# Patient Record
Sex: Female | Born: 1965 | Race: Black or African American | Hispanic: No | Smoking: Former smoker
Health system: Southern US, Community
[De-identification: ages and names within clinical notes are randomized; demographics above are authoritative.]

## PROBLEM LIST (undated history)

## (undated) DIAGNOSIS — G4733 Obstructive sleep apnea (adult) (pediatric): Secondary | ICD-10-CM

## (undated) DIAGNOSIS — I509 Heart failure, unspecified: Secondary | ICD-10-CM

## (undated) DIAGNOSIS — Z9981 Dependence on supplemental oxygen: Secondary | ICD-10-CM

## (undated) DIAGNOSIS — I4891 Unspecified atrial fibrillation: Secondary | ICD-10-CM

## (undated) DIAGNOSIS — I639 Cerebral infarction, unspecified: Secondary | ICD-10-CM

## (undated) DIAGNOSIS — I7121 Aneurysm of the ascending aorta, without rupture: Secondary | ICD-10-CM

## (undated) DIAGNOSIS — E079 Disorder of thyroid, unspecified: Secondary | ICD-10-CM

## (undated) DIAGNOSIS — M109 Gout, unspecified: Secondary | ICD-10-CM

## (undated) DIAGNOSIS — G4730 Sleep apnea, unspecified: Secondary | ICD-10-CM

## (undated) DIAGNOSIS — K219 Gastro-esophageal reflux disease without esophagitis: Secondary | ICD-10-CM

## (undated) DIAGNOSIS — T7840XA Allergy, unspecified, initial encounter: Secondary | ICD-10-CM

## (undated) DIAGNOSIS — D3502 Benign neoplasm of left adrenal gland: Secondary | ICD-10-CM

## (undated) DIAGNOSIS — E669 Obesity, unspecified: Secondary | ICD-10-CM

## (undated) DIAGNOSIS — I2699 Other pulmonary embolism without acute cor pulmonale: Secondary | ICD-10-CM

## (undated) DIAGNOSIS — J449 Chronic obstructive pulmonary disease, unspecified: Secondary | ICD-10-CM

## (undated) DIAGNOSIS — G3184 Mild cognitive impairment, so stated: Secondary | ICD-10-CM

## (undated) DIAGNOSIS — K5790 Diverticulosis of intestine, part unspecified, without perforation or abscess without bleeding: Secondary | ICD-10-CM

## (undated) DIAGNOSIS — F419 Anxiety disorder, unspecified: Secondary | ICD-10-CM

## (undated) DIAGNOSIS — I209 Angina pectoris, unspecified: Secondary | ICD-10-CM

## (undated) DIAGNOSIS — F1010 Alcohol abuse, uncomplicated: Secondary | ICD-10-CM

## (undated) DIAGNOSIS — I10 Essential (primary) hypertension: Secondary | ICD-10-CM

## (undated) DIAGNOSIS — F1411 Cocaine abuse, in remission: Secondary | ICD-10-CM

## (undated) DIAGNOSIS — I071 Rheumatic tricuspid insufficiency: Secondary | ICD-10-CM

## (undated) DIAGNOSIS — R011 Cardiac murmur, unspecified: Secondary | ICD-10-CM

## (undated) DIAGNOSIS — Z87442 Personal history of urinary calculi: Secondary | ICD-10-CM

## (undated) DIAGNOSIS — I712 Thoracic aortic aneurysm, without rupture: Secondary | ICD-10-CM

## (undated) DIAGNOSIS — T148XXA Other injury of unspecified body region, initial encounter: Secondary | ICD-10-CM

## (undated) DIAGNOSIS — K760 Fatty (change of) liver, not elsewhere classified: Secondary | ICD-10-CM

## (undated) HISTORY — DX: Allergy, unspecified, initial encounter: T78.40XA

## (undated) HISTORY — DX: Other injury of unspecified body region, initial encounter: T14.8XXA

## (undated) HISTORY — DX: Fatty (change of) liver, not elsewhere classified: K76.0

## (undated) HISTORY — DX: Sleep apnea, unspecified: G47.30

## (undated) HISTORY — DX: Benign neoplasm of left adrenal gland: D35.02

## (undated) HISTORY — PX: CARDIAC CATHETERIZATION: SHX172

## (undated) HISTORY — DX: Disorder of thyroid, unspecified: E07.9

## (undated) HISTORY — DX: Gastro-esophageal reflux disease without esophagitis: K21.9

## (undated) HISTORY — DX: Mild cognitive impairment of uncertain or unknown etiology: G31.84

## (undated) HISTORY — DX: Obesity, unspecified: E66.9

## (undated) HISTORY — DX: Other pulmonary embolism without acute cor pulmonale: I26.99

## (undated) HISTORY — DX: Alcohol abuse, uncomplicated: F10.10

---

## 2000-01-26 HISTORY — PX: OTHER SURGICAL HISTORY: SHX169

## 2001-09-20 HISTORY — PX: ORIF ANKLE FRACTURE BIMALLEOLAR: SUR920

## 2004-10-06 DIAGNOSIS — D3502 Benign neoplasm of left adrenal gland: Secondary | ICD-10-CM

## 2004-10-06 HISTORY — DX: Benign neoplasm of left adrenal gland: D35.02

## 2008-01-26 DIAGNOSIS — I4891 Unspecified atrial fibrillation: Secondary | ICD-10-CM | POA: Insufficient documentation

## 2008-01-26 DIAGNOSIS — I639 Cerebral infarction, unspecified: Secondary | ICD-10-CM

## 2008-01-26 HISTORY — DX: Cerebral infarction, unspecified: I63.9

## 2008-01-26 HISTORY — PX: ORIF WRIST FRACTURE: SHX2133

## 2008-01-26 HISTORY — DX: Unspecified atrial fibrillation: I48.91

## 2008-01-26 HISTORY — PX: OTHER SURGICAL HISTORY: SHX169

## 2008-12-23 DIAGNOSIS — K219 Gastro-esophageal reflux disease without esophagitis: Secondary | ICD-10-CM | POA: Insufficient documentation

## 2009-02-04 ENCOUNTER — Ambulatory Visit: Payer: Self-pay

## 2009-02-05 ENCOUNTER — Emergency Department (HOSPITAL_COMMUNITY): Admission: EM | Admit: 2009-02-05 | Discharge: 2009-02-05 | Payer: Self-pay | Admitting: Emergency Medicine

## 2009-02-14 ENCOUNTER — Ambulatory Visit (HOSPITAL_COMMUNITY): Admission: RE | Admit: 2009-02-14 | Discharge: 2009-02-14 | Payer: Self-pay | Admitting: Internal Medicine

## 2009-10-27 DIAGNOSIS — I10 Essential (primary) hypertension: Secondary | ICD-10-CM | POA: Insufficient documentation

## 2010-04-04 ENCOUNTER — Emergency Department: Payer: Self-pay | Admitting: Unknown Physician Specialty

## 2010-04-12 LAB — URINALYSIS, ROUTINE W REFLEX MICROSCOPIC
Glucose, UA: NEGATIVE mg/dL
Hgb urine dipstick: NEGATIVE
Ketones, ur: NEGATIVE mg/dL
Protein, ur: NEGATIVE mg/dL
Specific Gravity, Urine: 1.01 (ref 1.005–1.030)
pH: 7.5 (ref 5.0–8.0)

## 2010-04-12 LAB — BASIC METABOLIC PANEL
BUN: 8 mg/dL (ref 6–23)
Chloride: 99 mEq/L (ref 96–112)
Creatinine, Ser: 0.68 mg/dL (ref 0.4–1.2)
GFR calc Af Amer: >60 mL/min (ref >60)
Glucose, Bld: 119 mg/dL — ABNORMAL HIGH (ref 70–99)

## 2010-04-12 LAB — CBC
MCV: 80.8 fL (ref 78.0–100.0)
RDW: 15.9 % — ABNORMAL HIGH (ref 11.5–15.5)
WBC: 12.1 10*3/uL — ABNORMAL HIGH (ref 4.0–10.5)

## 2010-04-12 LAB — DIFFERENTIAL
Lymphocytes Relative: 17 % (ref 12–46)
Lymphs Abs: 2 10*3/uL (ref 0.7–4.0)
Monocytes Relative: 10 % (ref 3–12)
Neutrophils Relative %: 72 % (ref 43–77)

## 2010-04-12 LAB — GLUCOSE, CAPILLARY

## 2010-04-12 LAB — APTT: aPTT: 24 seconds (ref 24–37)

## 2011-12-31 ENCOUNTER — Observation Stay: Payer: Self-pay | Admitting: Family Medicine

## 2011-12-31 LAB — COMPREHENSIVE METABOLIC PANEL
Alkaline Phosphatase: 101 U/L (ref 50–136)
BUN: 12 mg/dL (ref 7–18)
Calcium, Total: 9.2 mg/dL (ref 8.5–10.1)
Co2: 30 mmol/L (ref 21–32)
EGFR (Non-African Amer.): >60
Glucose: 115 mg/dL — ABNORMAL HIGH (ref 65–99)
Osmolality: 280 (ref 275–301)
SGOT(AST): 18 U/L (ref 15–37)
SGPT (ALT): 24 U/L (ref 12–78)
Sodium: 140 mmol/L (ref 136–145)

## 2011-12-31 LAB — TROPONIN I
Troponin-I: <0.02 ng/mL
Troponin-I: <0.02 ng/mL

## 2011-12-31 LAB — PROTIME-INR
INR: 0.8
Prothrombin Time: 11.8 secs (ref 11.5–14.7)

## 2011-12-31 LAB — CBC
HGB: 12.9 g/dL (ref 12.0–16.0)
MCV: 88 fL (ref 80–100)
Platelet: 299 10*3/uL (ref 150–440)
RBC: 4.6 10*6/uL (ref 3.80–5.20)
WBC: 11.3 10*3/uL — ABNORMAL HIGH (ref 3.6–11.0)

## 2011-12-31 LAB — URINALYSIS, COMPLETE
Bilirubin,UR: NEGATIVE
Blood: NEGATIVE
Ketone: NEGATIVE
RBC,UR: 2 /HPF (ref 0–5)
Squamous Epithelial: 2

## 2011-12-31 LAB — PRO B NATRIURETIC PEPTIDE: B-Type Natriuretic Peptide: 297 pg/mL — ABNORMAL HIGH (ref 0–125)

## 2011-12-31 LAB — CK TOTAL AND CKMB (NOT AT ARMC)
CK, Total: 67 U/L (ref 21–215)
CK, Total: 52 U/L (ref 21–215)
CK, Total: 49 U/L (ref 21–215)
CK-MB: 0.7 ng/mL (ref 0.5–3.6)
CK-MB: 0.8 ng/mL (ref 0.5–3.6)

## 2011-12-31 LAB — APTT: Activated PTT: 26 secs (ref 23.6–35.9)

## 2012-01-01 LAB — BASIC METABOLIC PANEL
BUN: 15 mg/dL (ref 7–18)
Chloride: 102 mmol/L (ref 98–107)
Creatinine: 0.87 mg/dL (ref 0.60–1.30)
Potassium: 3.2 mmol/L — ABNORMAL LOW (ref 3.5–5.1)
Sodium: 139 mmol/L (ref 136–145)

## 2012-01-01 LAB — LIPID PANEL
Cholesterol: 214 mg/dL — ABNORMAL HIGH (ref 0–200)
Ldl Cholesterol, Calc: 124 mg/dL — ABNORMAL HIGH (ref 0–100)

## 2012-01-02 LAB — BASIC METABOLIC PANEL
Anion Gap: 6 — ABNORMAL LOW (ref 7–16)
BUN: 13 mg/dL (ref 7–18)
EGFR (African American): >60
EGFR (Non-African Amer.): >60
Osmolality: 278 (ref 275–301)
Potassium: 3.2 mmol/L — ABNORMAL LOW (ref 3.5–5.1)

## 2012-01-02 LAB — PRO B NATRIURETIC PEPTIDE: B-Type Natriuretic Peptide: 60 pg/mL (ref 0–125)

## 2012-02-05 ENCOUNTER — Emergency Department: Payer: Self-pay | Admitting: Emergency Medicine

## 2012-02-05 LAB — BASIC METABOLIC PANEL
Anion Gap: 12 (ref 7–16)
BUN: 22 mg/dL — ABNORMAL HIGH (ref 7–18)
Chloride: 108 mmol/L — ABNORMAL HIGH (ref 98–107)
Co2: 27 mmol/L (ref 21–32)
EGFR (African American): >60
EGFR (Non-African Amer.): >60
Glucose: 108 mg/dL — ABNORMAL HIGH (ref 65–99)
Osmolality: 296 (ref 275–301)
Sodium: 147 mmol/L — ABNORMAL HIGH (ref 136–145)

## 2012-02-05 LAB — CBC
HCT: 39.4 % (ref 35.0–47.0)
HGB: 12.4 g/dL (ref 12.0–16.0)
MCHC: 31.5 g/dL — ABNORMAL LOW (ref 32.0–36.0)
MCV: 88 fL (ref 80–100)
Platelet: 358 10*3/uL (ref 150–440)
RDW: 15.5 % — ABNORMAL HIGH (ref 11.5–14.5)
WBC: 10.4 10*3/uL (ref 3.6–11.0)

## 2012-02-05 LAB — CK TOTAL AND CKMB (NOT AT ARMC)
CK, Total: 99 U/L (ref 21–215)
CK-MB: 1 ng/mL (ref 0.5–3.6)

## 2012-02-07 ENCOUNTER — Inpatient Hospital Stay: Payer: Self-pay | Admitting: Family Medicine

## 2012-02-07 LAB — COMPREHENSIVE METABOLIC PANEL
Anion Gap: 10 (ref 7–16)
BUN: 20 mg/dL — ABNORMAL HIGH (ref 7–18)
Calcium, Total: 8.8 mg/dL (ref 8.5–10.1)
Chloride: 103 mmol/L (ref 98–107)
Co2: 28 mmol/L (ref 21–32)
EGFR (African American): >60
EGFR (Non-African Amer.): >60
Glucose: 149 mg/dL — ABNORMAL HIGH (ref 65–99)
SGOT(AST): 18 U/L (ref 15–37)
SGPT (ALT): 23 U/L (ref 12–78)
Sodium: 141 mmol/L (ref 136–145)
Total Protein: 7.7 g/dL (ref 6.4–8.2)

## 2012-02-07 LAB — CBC
HCT: 38.7 % (ref 35.0–47.0)
HGB: 12.1 g/dL (ref 12.0–16.0)
MCH: 27.3 pg (ref 26.0–34.0)
MCHC: 31.2 g/dL — ABNORMAL LOW (ref 32.0–36.0)
Platelet: 343 10*3/uL (ref 150–440)
RDW: 15.7 % — ABNORMAL HIGH (ref 11.5–14.5)
WBC: 16.4 10*3/uL — ABNORMAL HIGH (ref 3.6–11.0)

## 2012-02-08 LAB — CULTURE, BLOOD (SINGLE)

## 2012-02-12 LAB — CULTURE, BLOOD (SINGLE)

## 2012-02-19 ENCOUNTER — Inpatient Hospital Stay: Payer: Self-pay | Admitting: Internal Medicine

## 2012-02-19 DIAGNOSIS — I2699 Other pulmonary embolism without acute cor pulmonale: Secondary | ICD-10-CM

## 2012-02-19 HISTORY — DX: Other pulmonary embolism without acute cor pulmonale: I26.99

## 2012-02-19 LAB — BASIC METABOLIC PANEL
Anion Gap: 9 (ref 7–16)
BUN: 30 mg/dL — ABNORMAL HIGH (ref 7–18)
Calcium, Total: 8.8 mg/dL (ref 8.5–10.1)
Co2: 33 mmol/L — ABNORMAL HIGH (ref 21–32)
Creatinine: 1.1 mg/dL (ref 0.60–1.30)
EGFR (African American): >60
Glucose: 157 mg/dL — ABNORMAL HIGH (ref 65–99)
Sodium: 142 mmol/L (ref 136–145)

## 2012-02-19 LAB — CBC
MCHC: 32.4 g/dL (ref 32.0–36.0)
Platelet: 308 10*3/uL (ref 150–440)
RBC: 4.66 10*6/uL (ref 3.80–5.20)
RDW: 15.6 % — ABNORMAL HIGH (ref 11.5–14.5)
WBC: 16.1 10*3/uL — ABNORMAL HIGH (ref 3.6–11.0)

## 2012-02-19 LAB — CK TOTAL AND CKMB (NOT AT ARMC): CK, Total: 84 U/L (ref 21–215)

## 2012-02-19 LAB — PROTIME-INR
INR: 0.9
Prothrombin Time: 12.5 secs (ref 11.5–14.7)

## 2012-02-19 LAB — APTT
Activated PTT: 25.5 secs (ref 23.6–35.9)
Activated PTT: 86.5 secs — ABNORMAL HIGH (ref 23.6–35.9)

## 2012-02-19 LAB — MAGNESIUM: Magnesium: 2.2 mg/dL

## 2012-02-19 LAB — PRO B NATRIURETIC PEPTIDE: B-Type Natriuretic Peptide: 213 pg/mL — ABNORMAL HIGH (ref 0–125)

## 2012-02-20 LAB — BASIC METABOLIC PANEL
Anion Gap: 5 — ABNORMAL LOW (ref 7–16)
BUN: 24 mg/dL — ABNORMAL HIGH (ref 7–18)
Calcium, Total: 8.2 mg/dL — ABNORMAL LOW (ref 8.5–10.1)
Chloride: 106 mmol/L (ref 98–107)
Co2: 32 mmol/L (ref 21–32)
EGFR (African American): >60
EGFR (Non-African Amer.): >60
Osmolality: 290 (ref 275–301)
Potassium: 3.6 mmol/L (ref 3.5–5.1)
Sodium: 143 mmol/L (ref 136–145)

## 2012-02-20 LAB — HEMOGLOBIN A1C: Hemoglobin A1C: 6.4 % — ABNORMAL HIGH (ref 4.2–6.3)

## 2012-02-20 LAB — PROTIME-INR: Prothrombin Time: 13.6 secs (ref 11.5–14.7)

## 2012-02-21 ENCOUNTER — Ambulatory Visit: Payer: Self-pay | Admitting: Hematology and Oncology

## 2012-02-21 LAB — WBC: WBC: 10.4 10*3/uL (ref 3.6–11.0)

## 2012-02-21 LAB — APTT
Activated PTT: 111.8 secs — ABNORMAL HIGH (ref 23.6–35.9)
Activated PTT: 98.7 secs — ABNORMAL HIGH (ref 23.6–35.9)

## 2012-03-02 ENCOUNTER — Ambulatory Visit: Payer: Self-pay | Admitting: Hematology and Oncology

## 2012-03-15 ENCOUNTER — Emergency Department: Payer: Self-pay | Admitting: Emergency Medicine

## 2012-03-15 LAB — BASIC METABOLIC PANEL
BUN: 23 mg/dL — ABNORMAL HIGH (ref 7–18)
Chloride: 109 mmol/L — ABNORMAL HIGH (ref 98–107)
Co2: 29 mmol/L (ref 21–32)
EGFR (African American): >60
Glucose: 97 mg/dL (ref 65–99)
Potassium: 3.1 mmol/L — ABNORMAL LOW (ref 3.5–5.1)
Sodium: 147 mmol/L — ABNORMAL HIGH (ref 136–145)

## 2012-03-15 LAB — CBC
HGB: 12.2 g/dL (ref 12.0–16.0)
MCH: 28.1 pg (ref 26.0–34.0)
MCHC: 31.8 g/dL — ABNORMAL LOW (ref 32.0–36.0)
MCV: 88 fL (ref 80–100)
Platelet: 261 10*3/uL (ref 150–440)
RDW: 15.6 % — ABNORMAL HIGH (ref 11.5–14.5)
WBC: 9.8 10*3/uL (ref 3.6–11.0)

## 2012-03-15 LAB — CK TOTAL AND CKMB (NOT AT ARMC)
CK, Total: 40 U/L (ref 21–215)
CK-MB: <0.5 ng/mL — ABNORMAL LOW (ref 0.5–3.6)

## 2012-03-25 ENCOUNTER — Ambulatory Visit: Payer: Self-pay | Admitting: Hematology and Oncology

## 2012-04-25 ENCOUNTER — Ambulatory Visit: Payer: Self-pay | Admitting: Hematology and Oncology

## 2012-04-27 LAB — CBC CANCER CENTER
Basophil %: 0.7 %
Eosinophil %: 0.7 %
HCT: 38.9 % (ref 35.0–47.0)
HGB: 12.1 g/dL (ref 12.0–16.0)
MCHC: 31.2 g/dL — ABNORMAL LOW (ref 32.0–36.0)
Monocyte #: 1.3 x10 3/mm — ABNORMAL HIGH (ref 0.2–0.9)
Monocyte %: 9.4 %
Neutrophil #: 10.4 x10 3/mm — ABNORMAL HIGH (ref 1.4–6.5)
Neutrophil %: 74.6 %
Platelet: 322 x10 3/mm (ref 150–440)
WBC: 13.9 x10 3/mm — ABNORMAL HIGH (ref 3.6–11.0)

## 2012-04-27 LAB — BASIC METABOLIC PANEL
Calcium, Total: 8.9 mg/dL (ref 8.5–10.1)
EGFR (African American): >60
EGFR (Non-African Amer.): >60
Glucose: 96 mg/dL (ref 65–99)
Sodium: 144 mmol/L (ref 136–145)

## 2012-05-04 LAB — POTASSIUM: Potassium: 3.6 mmol/L (ref 3.5–5.1)

## 2012-05-25 ENCOUNTER — Ambulatory Visit: Payer: Self-pay | Admitting: Hematology and Oncology

## 2012-06-29 ENCOUNTER — Ambulatory Visit: Payer: Self-pay | Admitting: Hematology and Oncology

## 2012-06-29 LAB — CBC CANCER CENTER
Basophil #: 0.1 x10 3/mm (ref 0.0–0.1)
Eosinophil #: 0.1 x10 3/mm (ref 0.0–0.7)
Eosinophil %: 1.4 %
HCT: 38.4 % (ref 35.0–47.0)
Lymphocyte #: 1.4 x10 3/mm (ref 1.0–3.6)
MCH: 27.5 pg (ref 26.0–34.0)
MCV: 85 fL (ref 80–100)
Monocyte #: 0.8 x10 3/mm (ref 0.2–0.9)
Monocyte %: 7.6 %
Neutrophil %: 76.4 %
Platelet: 306 x10 3/mm (ref 150–440)
WBC: 10.3 x10 3/mm (ref 3.6–11.0)

## 2012-06-29 LAB — COMPREHENSIVE METABOLIC PANEL
Albumin: 3.2 g/dL — ABNORMAL LOW (ref 3.4–5.0)
BUN: 13 mg/dL (ref 7–18)
Calcium, Total: 9.3 mg/dL (ref 8.5–10.1)
Chloride: 105 mmol/L (ref 98–107)
Co2: 30 mmol/L (ref 21–32)
EGFR (African American): >60
EGFR (Non-African Amer.): >60
Glucose: 110 mg/dL — ABNORMAL HIGH (ref 65–99)
SGOT(AST): 15 U/L (ref 15–37)
SGPT (ALT): 23 U/L (ref 12–78)
Sodium: 145 mmol/L (ref 136–145)
Total Protein: 7.3 g/dL (ref 6.4–8.2)

## 2012-07-14 ENCOUNTER — Emergency Department: Payer: Self-pay | Admitting: Emergency Medicine

## 2012-07-14 LAB — URINALYSIS, COMPLETE
Bilirubin,UR: NEGATIVE
Blood: NEGATIVE
Glucose,UR: NEGATIVE mg/dL
Ketone: NEGATIVE
Leukocyte Esterase: NEGATIVE
Nitrite: NEGATIVE
Ph: 9
Protein: NEGATIVE
RBC,UR: 1 /HPF
Specific Gravity: 1.009
Squamous Epithelial: 2
WBC UR: 13 /HPF

## 2012-07-14 LAB — COMPREHENSIVE METABOLIC PANEL WITH GFR
Albumin: 3.5 g/dL
Alkaline Phosphatase: 89 U/L
Anion Gap: 7
BUN: 10 mg/dL
Bilirubin,Total: 0.4 mg/dL
Calcium, Total: 9.5 mg/dL
Chloride: 106 mmol/L
Co2: 29 mmol/L
Creatinine: 0.81 mg/dL
EGFR (African American): >60
EGFR (Non-African Amer.): >60
Glucose: 99 mg/dL
Osmolality: 282
Potassium: 2.8 mmol/L — ABNORMAL LOW
SGOT(AST): 15 U/L
SGPT (ALT): 20 U/L
Sodium: 142 mmol/L
Total Protein: 7.5 g/dL

## 2012-07-14 LAB — CBC
HCT: 39 % (ref 35.0–47.0)
MCH: 26.4 pg (ref 26.0–34.0)
MCHC: 31.9 g/dL — ABNORMAL LOW (ref 32.0–36.0)
MCV: 83 fL (ref 80–100)
Platelet: 361 10*3/uL (ref 150–440)
RDW: 15.9 % — ABNORMAL HIGH (ref 11.5–14.5)
WBC: 14.7 10*3/uL — ABNORMAL HIGH (ref 3.6–11.0)

## 2012-07-25 ENCOUNTER — Ambulatory Visit: Payer: Self-pay | Admitting: Hematology and Oncology

## 2012-08-18 ENCOUNTER — Emergency Department: Payer: Self-pay | Admitting: Emergency Medicine

## 2012-08-18 LAB — COMPREHENSIVE METABOLIC PANEL
Albumin: 3.3 g/dL — ABNORMAL LOW (ref 3.4–5.0)
Alkaline Phosphatase: 93 U/L (ref 50–136)
BUN: 11 mg/dL (ref 7–18)
Bilirubin,Total: 0.8 mg/dL (ref 0.2–1.0)
Creatinine: 0.78 mg/dL (ref 0.60–1.30)
EGFR (African American): >60
Glucose: 100 mg/dL — ABNORMAL HIGH (ref 65–99)
Sodium: 143 mmol/L (ref 136–145)
Total Protein: 7.3 g/dL (ref 6.4–8.2)

## 2012-08-18 LAB — CK TOTAL AND CKMB (NOT AT ARMC): CK, Total: 46 U/L (ref 21–215)

## 2012-08-18 LAB — CBC WITH DIFFERENTIAL/PLATELET
Basophil #: 0.1 10*3/uL (ref 0.0–0.1)
Basophil %: 0.6 %
Eosinophil %: 1.4 %
HGB: 11.8 g/dL — ABNORMAL LOW (ref 12.0–16.0)
Lymphocyte %: 15 %
MCHC: 32.8 g/dL (ref 32.0–36.0)
Monocyte %: 9.1 %
Neutrophil %: 73.9 %

## 2012-08-18 LAB — URINALYSIS, COMPLETE
Blood: NEGATIVE
Leukocyte Esterase: NEGATIVE
Nitrite: NEGATIVE
Protein: NEGATIVE
WBC UR: 2 /HPF (ref 0–5)

## 2012-08-18 LAB — PRO B NATRIURETIC PEPTIDE: B-Type Natriuretic Peptide: 224 pg/mL — ABNORMAL HIGH (ref 0–125)

## 2012-09-11 ENCOUNTER — Observation Stay: Payer: Self-pay | Admitting: Internal Medicine

## 2012-09-11 LAB — TROPONIN I
Troponin-I: <0.02 ng/mL
Troponin-I: 0.03 ng/mL

## 2012-09-11 LAB — CBC
HCT: 37.1 % (ref 35.0–47.0)
MCH: 26.2 pg (ref 26.0–34.0)
MCHC: 32.2 g/dL (ref 32.0–36.0)
RBC: 4.57 10*6/uL (ref 3.80–5.20)
RDW: 18.6 % — ABNORMAL HIGH (ref 11.5–14.5)

## 2012-09-11 LAB — COMPREHENSIVE METABOLIC PANEL
Chloride: 108 mmol/L — ABNORMAL HIGH (ref 98–107)
Co2: 31 mmol/L (ref 21–32)
Creatinine: 0.94 mg/dL (ref 0.60–1.30)
Osmolality: 287 (ref 275–301)
SGOT(AST): 16 U/L (ref 15–37)

## 2012-09-11 LAB — POTASSIUM: Potassium: 3.1 mmol/L — ABNORMAL LOW (ref 3.5–5.1)

## 2012-09-11 LAB — HEMOGLOBIN A1C: Hemoglobin A1C: 6.2 % (ref 4.2–6.3)

## 2012-09-12 DIAGNOSIS — R079 Chest pain, unspecified: Secondary | ICD-10-CM

## 2012-09-12 LAB — CBC WITH DIFFERENTIAL/PLATELET
Basophil #: 0 10*3/uL (ref 0.0–0.1)
Basophil %: 0.5 %
Eosinophil %: 1.2 %
HGB: 11.4 g/dL — ABNORMAL LOW (ref 12.0–16.0)
Lymphocyte #: 1.2 10*3/uL (ref 1.0–3.6)
Lymphocyte %: 13.6 %
MCH: 26.7 pg (ref 26.0–34.0)
MCHC: 32.6 g/dL (ref 32.0–36.0)
MCV: 82 fL (ref 80–100)
Monocyte #: 0.8 x10 3/mm (ref 0.2–0.9)
Monocyte %: 9.2 %
Neutrophil #: 6.6 10*3/uL — ABNORMAL HIGH (ref 1.4–6.5)
Neutrophil %: 75.5 %
Platelet: 337 10*3/uL (ref 150–440)
RBC: 4.29 10*6/uL (ref 3.80–5.20)
RDW: 18.3 % — ABNORMAL HIGH (ref 11.5–14.5)
WBC: 8.8 10*3/uL (ref 3.6–11.0)

## 2012-09-12 LAB — BASIC METABOLIC PANEL
Anion Gap: 6 — ABNORMAL LOW (ref 7–16)
BUN: 12 mg/dL (ref 7–18)
Chloride: 108 mmol/L — ABNORMAL HIGH (ref 98–107)
Co2: 31 mmol/L (ref 21–32)
Creatinine: 0.83 mg/dL (ref 0.60–1.30)
Osmolality: 289 (ref 275–301)
Potassium: 3.1 mmol/L — ABNORMAL LOW (ref 3.5–5.1)

## 2012-09-12 LAB — LIPID PANEL
HDL Cholesterol: 46 mg/dL (ref 40–60)
Ldl Cholesterol, Calc: 122 mg/dL — ABNORMAL HIGH (ref 0–100)
Triglycerides: 96 mg/dL (ref 0–200)
VLDL Cholesterol, Calc: 19 mg/dL (ref 5–40)

## 2012-09-12 LAB — TSH: Thyroid Stimulating Horm: 0.282 u[IU]/mL — ABNORMAL LOW

## 2012-09-12 LAB — TROPONIN I: Troponin-I: 0.02 ng/mL

## 2012-09-28 DIAGNOSIS — D3500 Benign neoplasm of unspecified adrenal gland: Secondary | ICD-10-CM | POA: Insufficient documentation

## 2012-12-28 ENCOUNTER — Ambulatory Visit: Payer: Self-pay | Admitting: Hematology and Oncology

## 2012-12-28 LAB — CBC CANCER CENTER
HCT: 39.8 % (ref 35.0–47.0)
Lymphocyte #: 1.4 x10 3/mm (ref 1.0–3.6)
Lymphocyte %: 11.4 %
MCH: 25.6 pg — ABNORMAL LOW (ref 26.0–34.0)
MCHC: 31.4 g/dL — ABNORMAL LOW (ref 32.0–36.0)
MCV: 82 fL (ref 80–100)
Monocyte #: 0.9 x10 3/mm (ref 0.2–0.9)
Monocyte %: 7.3 %
Platelet: 305 x10 3/mm (ref 150–440)
RBC: 4.87 10*6/uL (ref 3.80–5.20)

## 2012-12-28 LAB — COMPREHENSIVE METABOLIC PANEL
Albumin: 3.3 g/dL — ABNORMAL LOW (ref 3.4–5.0)
Alkaline Phosphatase: 99 U/L
BUN: 14 mg/dL (ref 7–18)
Bilirubin,Total: 0.5 mg/dL (ref 0.2–1.0)
Calcium, Total: 9.3 mg/dL (ref 8.5–10.1)
Co2: 31 mmol/L (ref 21–32)
Creatinine: 0.87 mg/dL (ref 0.60–1.30)
EGFR (African American): >60
EGFR (Non-African Amer.): >60
Potassium: 3.5 mmol/L (ref 3.5–5.1)
SGOT(AST): 16 U/L (ref 15–37)

## 2013-01-25 ENCOUNTER — Ambulatory Visit: Payer: Self-pay | Admitting: Hematology and Oncology

## 2013-08-07 ENCOUNTER — Inpatient Hospital Stay: Payer: Self-pay | Admitting: Internal Medicine

## 2013-08-07 LAB — CK TOTAL AND CKMB (NOT AT ARMC)
CK, TOTAL: 55 U/L
CK-MB: 0.8 ng/mL (ref 0.5–3.6)

## 2013-08-07 LAB — DRUG SCREEN, URINE

## 2013-08-07 LAB — URINALYSIS, COMPLETE
BLOOD: NEGATIVE
Bilirubin,UR: NEGATIVE
GLUCOSE, UR: NEGATIVE mg/dL (ref 0–75)
KETONE: NEGATIVE
Leukocyte Esterase: NEGATIVE
Nitrite: NEGATIVE
PROTEIN: NEGATIVE
Ph: 7 (ref 4.5–8.0)
RBC,UR: 2 /HPF (ref 0–5)
Specific Gravity: 1.011 (ref 1.003–1.030)

## 2013-08-07 LAB — CBC
HCT: 38.4 % (ref 35.0–47.0)
HGB: 12.1 g/dL (ref 12.0–16.0)
MCH: 26.3 pg (ref 26.0–34.0)
MCHC: 31.4 g/dL — AB (ref 32.0–36.0)
MCV: 84 fL (ref 80–100)
PLATELETS: 314 10*3/uL (ref 150–440)
RBC: 4.58 10*6/uL (ref 3.80–5.20)
RDW: 17.4 % — AB (ref 11.5–14.5)
WBC: 10.7 10*3/uL (ref 3.6–11.0)

## 2013-08-07 LAB — BASIC METABOLIC PANEL
ANION GAP: 8 (ref 7–16)
BUN: 13 mg/dL (ref 7–18)
CHLORIDE: 105 mmol/L (ref 98–107)
CO2: 31 mmol/L (ref 21–32)
Calcium, Total: 8.7 mg/dL (ref 8.5–10.1)
Creatinine: 0.85 mg/dL (ref 0.60–1.30)
EGFR (African American): >60
EGFR (Non-African Amer.): >60
GLUCOSE: 108 mg/dL — AB (ref 65–99)
Osmolality: 287 (ref 275–301)
Potassium: 2.9 mmol/L — ABNORMAL LOW (ref 3.5–5.1)
SODIUM: 144 mmol/L (ref 136–145)

## 2013-08-07 LAB — TROPONIN I: Troponin-I: <0.02 ng/mL

## 2013-08-07 LAB — PREGNANCY, URINE: Pregnancy Test, Urine: NEGATIVE m[IU]/mL

## 2013-08-07 LAB — MAGNESIUM: Magnesium: 2.2 mg/dL

## 2013-08-08 LAB — BASIC METABOLIC PANEL
ANION GAP: 8 (ref 7–16)
BUN: 11 mg/dL (ref 7–18)
CALCIUM: 8.8 mg/dL (ref 8.5–10.1)
Chloride: 104 mmol/L (ref 98–107)
Co2: 28 mmol/L (ref 21–32)
Creatinine: 1.01 mg/dL (ref 0.60–1.30)
EGFR (African American): >60
Glucose: 99 mg/dL (ref 65–99)
Osmolality: 279 (ref 275–301)
Potassium: 3.5 mmol/L (ref 3.5–5.1)
Sodium: 140 mmol/L (ref 136–145)

## 2013-08-08 LAB — CBC WITH DIFFERENTIAL/PLATELET
Basophil #: 0 10*3/uL (ref 0.0–0.1)
Basophil %: 0.5 %
EOS ABS: 0.1 10*3/uL (ref 0.0–0.7)
EOS PCT: 1.5 %
HCT: 35.9 % (ref 35.0–47.0)
HGB: 11.3 g/dL — ABNORMAL LOW (ref 12.0–16.0)
Lymphocyte #: 1.6 10*3/uL (ref 1.0–3.6)
Lymphocyte %: 18.9 %
MCH: 26.7 pg (ref 26.0–34.0)
MCHC: 31.3 g/dL — ABNORMAL LOW (ref 32.0–36.0)
MCV: 85 fL (ref 80–100)
Monocyte #: 0.8 x10 3/mm (ref 0.2–0.9)
Monocyte %: 9 %
NEUTROS ABS: 6.1 10*3/uL (ref 1.4–6.5)
NEUTROS PCT: 70.1 %
Platelet: 315 10*3/uL (ref 150–440)
RBC: 4.22 10*6/uL (ref 3.80–5.20)
RDW: 18.1 % — ABNORMAL HIGH (ref 11.5–14.5)
WBC: 8.7 10*3/uL (ref 3.6–11.0)

## 2013-08-08 LAB — MAGNESIUM: Magnesium: 2.3 mg/dL

## 2013-08-20 ENCOUNTER — Observation Stay: Payer: Self-pay | Admitting: Internal Medicine

## 2013-08-20 LAB — COMPREHENSIVE METABOLIC PANEL
ALT: 23 U/L
AST: 21 U/L (ref 15–37)
Albumin: 3.2 g/dL — ABNORMAL LOW (ref 3.4–5.0)
Alkaline Phosphatase: 70 U/L
Anion Gap: 10 (ref 7–16)
BUN: 10 mg/dL (ref 7–18)
Bilirubin,Total: 0.5 mg/dL (ref 0.2–1.0)
CALCIUM: 9.2 mg/dL (ref 8.5–10.1)
CHLORIDE: 107 mmol/L (ref 98–107)
CO2: 26 mmol/L (ref 21–32)
Creatinine: 0.77 mg/dL (ref 0.60–1.30)
EGFR (African American): >60
EGFR (Non-African Amer.): >60
GLUCOSE: 85 mg/dL (ref 65–99)
OSMOLALITY: 283 (ref 275–301)
Potassium: 3.1 mmol/L — ABNORMAL LOW (ref 3.5–5.1)
Sodium: 143 mmol/L (ref 136–145)
Total Protein: 7.2 g/dL (ref 6.4–8.2)

## 2013-08-20 LAB — LIPASE, BLOOD: LIPASE: 123 U/L (ref 73–393)

## 2013-08-20 LAB — CBC
HCT: 38.4 % (ref 35.0–47.0)
HGB: 12.1 g/dL (ref 12.0–16.0)
MCH: 26.7 pg (ref 26.0–34.0)
MCHC: 31.4 g/dL — AB (ref 32.0–36.0)
MCV: 85 fL (ref 80–100)
Platelet: 301 10*3/uL (ref 150–440)
RBC: 4.51 10*6/uL (ref 3.80–5.20)
RDW: 16.8 % — AB (ref 11.5–14.5)
WBC: 8.1 10*3/uL (ref 3.6–11.0)

## 2013-08-20 LAB — CK TOTAL AND CKMB (NOT AT ARMC): CK, TOTAL: 29 U/L

## 2013-08-20 LAB — TROPONIN I: Troponin-I: <0.02 ng/mL

## 2013-08-21 LAB — BASIC METABOLIC PANEL
ANION GAP: 9 (ref 7–16)
BUN: 8 mg/dL (ref 7–18)
CHLORIDE: 104 mmol/L (ref 98–107)
CREATININE: 0.97 mg/dL (ref 0.60–1.30)
Calcium, Total: 9 mg/dL (ref 8.5–10.1)
Co2: 28 mmol/L (ref 21–32)
EGFR (Non-African Amer.): >60
Glucose: 88 mg/dL (ref 65–99)
OSMOLALITY: 279 (ref 275–301)
Potassium: 3.2 mmol/L — ABNORMAL LOW (ref 3.5–5.1)
Sodium: 141 mmol/L (ref 136–145)

## 2013-08-21 LAB — CBC WITH DIFFERENTIAL/PLATELET
Basophil #: 0 10*3/uL (ref 0.0–0.1)
Basophil %: 0.5 %
EOS PCT: 0.7 %
Eosinophil #: 0.1 10*3/uL (ref 0.0–0.7)
HCT: 38.1 % (ref 35.0–47.0)
HGB: 12 g/dL (ref 12.0–16.0)
Lymphocyte #: 1.5 10*3/uL (ref 1.0–3.6)
Lymphocyte %: 13.8 %
MCH: 26.9 pg (ref 26.0–34.0)
MCHC: 31.6 g/dL — ABNORMAL LOW (ref 32.0–36.0)
MCV: 85 fL (ref 80–100)
Monocyte #: 0.9 x10 3/mm (ref 0.2–0.9)
Monocyte %: 8.6 %
Neutrophil #: 8.3 10*3/uL — ABNORMAL HIGH (ref 1.4–6.5)
Neutrophil %: 76.4 %
Platelet: 303 10*3/uL (ref 150–440)
RBC: 4.47 10*6/uL (ref 3.80–5.20)
RDW: 17.1 % — AB (ref 11.5–14.5)
WBC: 10.8 10*3/uL (ref 3.6–11.0)

## 2013-08-21 LAB — TSH: Thyroid Stimulating Horm: 1.15 u[IU]/mL

## 2013-09-11 DIAGNOSIS — J449 Chronic obstructive pulmonary disease, unspecified: Secondary | ICD-10-CM | POA: Insufficient documentation

## 2014-01-14 ENCOUNTER — Inpatient Hospital Stay: Payer: Self-pay | Admitting: Internal Medicine

## 2014-01-14 LAB — CK-MB
CK-MB: 0.9 ng/mL (ref 0.5–3.6)
CK-MB: 0.7 ng/mL (ref 0.5–3.6)
CK-MB: 0.9 ng/mL (ref 0.5–3.6)

## 2014-01-14 LAB — CBC WITH DIFFERENTIAL/PLATELET
BASOS PCT: 0.7 %
Basophil #: 0.1 10*3/uL (ref 0.0–0.1)
EOS ABS: 0.1 10*3/uL (ref 0.0–0.7)
Eosinophil %: 0.6 %
HCT: 41.3 % (ref 35.0–47.0)
HGB: 13.2 g/dL (ref 12.0–16.0)
LYMPHS ABS: 1.7 10*3/uL (ref 1.0–3.6)
LYMPHS PCT: 20.7 %
MCH: 27.3 pg (ref 26.0–34.0)
MCHC: 31.9 g/dL — ABNORMAL LOW (ref 32.0–36.0)
MCV: 86 fL (ref 80–100)
Monocyte #: 0.7 x10 3/mm (ref 0.2–0.9)
Monocyte %: 8.2 %
NEUTROS PCT: 69.8 %
Neutrophil #: 5.7 10*3/uL (ref 1.4–6.5)
Platelet: 286 10*3/uL (ref 150–440)
RBC: 4.82 10*6/uL (ref 3.80–5.20)
RDW: 16.5 % — AB (ref 11.5–14.5)
WBC: 8.2 10*3/uL (ref 3.6–11.0)

## 2014-01-14 LAB — COMPREHENSIVE METABOLIC PANEL
ALK PHOS: 93 U/L
ANION GAP: 8 (ref 7–16)
Albumin: 3.2 g/dL — ABNORMAL LOW (ref 3.4–5.0)
BUN: 15 mg/dL (ref 7–18)
Bilirubin,Total: 0.2 mg/dL (ref 0.2–1.0)
CALCIUM: 8.6 mg/dL (ref 8.5–10.1)
CREATININE: 0.72 mg/dL (ref 0.60–1.30)
Chloride: 108 mmol/L — ABNORMAL HIGH (ref 98–107)
Co2: 27 mmol/L (ref 21–32)
EGFR (African American): >60
EGFR (Non-African Amer.): >60
GLUCOSE: 108 mg/dL — AB (ref 65–99)
OSMOLALITY: 286 (ref 275–301)
Potassium: 3.4 mmol/L — ABNORMAL LOW (ref 3.5–5.1)
SGOT(AST): 43 U/L — ABNORMAL HIGH (ref 15–37)
SGPT (ALT): 39 U/L
SODIUM: 143 mmol/L (ref 136–145)
Total Protein: 7.5 g/dL (ref 6.4–8.2)

## 2014-01-14 LAB — TROPONIN I
Troponin-I: <0.02 ng/mL
Troponin-I: <0.02 ng/mL
Troponin-I: <0.02 ng/mL
Troponin-I: <0.02 ng/mL

## 2014-01-14 LAB — URINALYSIS, COMPLETE
BACTERIA: NONE SEEN
BLOOD: NEGATIVE
Bilirubin,UR: NEGATIVE
GLUCOSE, UR: NEGATIVE mg/dL (ref 0–75)
Hyaline Cast: 2
KETONE: NEGATIVE
Leukocyte Esterase: NEGATIVE
NITRITE: NEGATIVE
Ph: 7 (ref 4.5–8.0)
Protein: NEGATIVE
RBC,UR: <1 /HPF (ref 0–5)
SPECIFIC GRAVITY: 1.013 (ref 1.003–1.030)
Squamous Epithelial: 1

## 2014-01-14 LAB — LIPASE, BLOOD: Lipase: 147 U/L (ref 73–393)

## 2014-01-14 LAB — MAGNESIUM: Magnesium: 1.8 mg/dL

## 2014-01-15 LAB — CBC WITH DIFFERENTIAL/PLATELET
Basophil #: 0 10*3/uL (ref 0.0–0.1)
Basophil %: 0.4 %
Eosinophil #: 0.1 10*3/uL (ref 0.0–0.7)
Eosinophil %: 0.5 %
HCT: 40.5 % (ref 35.0–47.0)
HGB: 12.7 g/dL (ref 12.0–16.0)
Lymphocyte #: 1.6 10*3/uL (ref 1.0–3.6)
Lymphocyte %: 16.6 %
MCH: 27.3 pg (ref 26.0–34.0)
MCHC: 31.4 g/dL — ABNORMAL LOW (ref 32.0–36.0)
MCV: 87 fL (ref 80–100)
Monocyte #: 0.9 x10 3/mm (ref 0.2–0.9)
Monocyte %: 9.6 %
Neutrophil #: 7.1 10*3/uL — ABNORMAL HIGH (ref 1.4–6.5)
Neutrophil %: 72.9 %
Platelet: 277 10*3/uL (ref 150–440)
RBC: 4.66 10*6/uL (ref 3.80–5.20)
RDW: 16 % — ABNORMAL HIGH (ref 11.5–14.5)
WBC: 9.7 10*3/uL (ref 3.6–11.0)

## 2014-01-15 LAB — TSH: Thyroid Stimulating Horm: 0.947 u[IU]/mL

## 2014-01-15 LAB — BASIC METABOLIC PANEL
Anion Gap: 6 — ABNORMAL LOW (ref 7–16)
BUN: 17 mg/dL (ref 7–18)
Calcium, Total: 8.6 mg/dL (ref 8.5–10.1)
Chloride: 105 mmol/L (ref 98–107)
Co2: 28 mmol/L (ref 21–32)
Creatinine: 1.36 mg/dL — ABNORMAL HIGH (ref 0.60–1.30)
EGFR (African American): 54 — ABNORMAL LOW
EGFR (Non-African Amer.): 44 — ABNORMAL LOW
Glucose: 90 mg/dL (ref 65–99)
Osmolality: 279 (ref 275–301)
Potassium: 3.9 mmol/L (ref 3.5–5.1)
Sodium: 139 mmol/L (ref 136–145)

## 2014-05-14 NOTE — H&P (Signed)
PATIENT NAME:  [name], DORVIL MR#:  [id number] DATE OF BIRTH:  1965-11-14  DATE OF ADMISSION:  12/31/2011  REFERRING PHYSICIAN: Dr. Mariea Clonts.   PRIMARY CARE PHYSICIAN: Dr. Jonna Clark, [facility].   CHIEF COMPLAINT: Pain under the right breast, shortness of breath.   HISTORY OF PRESENT ILLNESS: The patient is a 49 year old female with past medical history of anxiety, depression, hypertension, gout, who reports that she was in her usual state of health when she started developing sharp pain under her right breast, which was associated with some nausea and vomiting. She also felt very short of breath. She denies any radiation, trauma, doing any heavy moving or lifting recently. In the Emergency Room she received some pain medications and was noted to be hypoxic. Currently, when she is taken off her oxygen, she dips down into the 80s. The patient is obese, but denies any respiratory problems in the past or using home oxygen. She is being admitted for observation.   ALLERGIES: Penicillin.  PAST MEDICAL HISTORY:   1. Anxiety. 2. Depression. 3. Hypertension. 4. Gout.  5. The patient has used cocaine in the past, but reports that she last used it in 2010.   PAST SURGICAL HISTORY:  1. The patient fractured both her forearms and had open reduction and internal fixation for that. 2. Left ankle open reduction and internal fixation for fracture.   SOCIAL HISTORY: The patient smokes on and off, she says a pack will last her up to a month. She last used crack cocaine in 2010. Denies any drug abuse. She is separated and lives alone.   FAMILY HISTORY: Positive for cancer and hypertension.   MEDICATIONS: 1. Aspirin 81 mg daily.  2. Hydralazine 50 mg 3 times a day. 3. HCTZ 25 mg daily. 4. K-Dur 10 milliequivalents b.i.d.  5. Lasix 40 mg daily.  6. Lisinopril 40 mg daily.  7. Melatonin 10 mg at bedtime.  8. Toprol-XL 100 mg daily.    REVIEW OF SYSTEMS:  CONSTITUTIONAL: Denies  any fever or fatigue. EYES: Denies any blurred or double vision. ENT: Denies any tinnitus or ear pain. RESPIRATORY: Denies any cough or wheezing. CARDIOVASCULAR: Denies any chest pain, palpitations, syncope. GASTROINTESTINAL: Denies any ulcers, change in bowel habits. GU: Denies any dysuria or hematuria. ENDOCRINE: Denies any polyuria or nocturia. HEME/LYMPH: Denies any anemia  or easy bruisability. INTEGUMENT: Denies any acne or rash. MUSCULOSKELETAL: Denies any swelling or gout. NEUROLOGICAL: Denies any numbness or weakness. PSYCH: Reports anxiety and depression, but is not on any medications.   PHYSICAL EXAMINATION:  VITAL SIGNS: Temperature 98.2, heart rate 101, respiratory rate 38, blood pressure 128/126. Pulse ox 88% on room air.   GENERAL: The patient is an obese Caucasian female lying in bed, not in acute distress.   HEAD: Atraumatic, normocephalic.   EYES: No pallor, icterus, or cyanosis. Pupils are equal, round and reactive to light and accommodation. Extraocular movements intact.   ENT: Wet mucous membranes. No oropharyngeal erythema or thrush.   NECK: Supple. No masses. No JVD. No thyromegaly. No lymphadenopathy.   CHEST WALL: No tenderness to palpation. Not using accessory muscles of respiration. No intercostal muscle retractions. The patient has some tenderness to palpation on her right breast.   LUNGS: Bilaterally clear to auscultation. No wheezing, rales, or rhonchi.   CARDIOVASCULAR: S1, S2 regular. No murmur, rubs, or gallops.   ABDOMEN: Soft, nontender, nondistended. No guarding or rigidity. No organomegaly.   SKIN: No rashes or lesions.   PERIPHERIES:  No pedal edema. 2+ pedal pulses.   MUSCULOSKELETAL: No cyanosis or clubbing.   NEUROLOGIC: Awake, alert, oriented x3. Nonfocal neurological exam. Cranial nerves grossly intact.   PSYCH: Normal mood and affect.   LABORATORY, RADIOLOGICAL AND DIAGNOSTIC DATA: Abdominal ultrasound was normal. CT of the chest showed no  evidence of acute PE, marked enlargement of the cardiac chambers. No pericardial effusion or pleural effusion. Fatty infiltrative changes of the liver. Left adrenal mass measuring 3.6 to 2.3 cm shows no evidence of infection. CBC essentially normal other than white count of 11.3, glucose 115, BUN 12, creatinine 0.97. Sodium 140, potassium 3.3, chloride 103, CO2 30, calcium 9.2. LFTs are normal. Lipase normal. CK, troponin normal. BNP 297.   ASSESSMENT AND PLAN:    1. Atypical right-sided chest pain. The patient has tenderness to palpation. This is possibly musculoskeletal. However, given her risk factors, we will admit her to telemetry and check serial cardiac enzymes. We will continue her aspirin, beta blocker and ACE inhibitor.  2. Accelerated hypertension. The patient's blood pressure was significantly elevated on presentation, possibly related to acute distress from pain. We will continue her current medications for the time being and also place on p.r.n. hydralazine. We will adjust medications to achieve good hypertensive control.  3. Hypoxia. The patient was found to be hypoxemic on room air. The etiology is unclear, but she is obese and may have underlying sleep apnea. Will place her on p.r.n. nebulizer treatments and advise her to use incentive spirometry. We will follow up the patient's hospital course and ambulate her without oxygen. She may benefit from outpatient sleep studies.  4. Hyperglycemia. Will check a hemoglobin A1c.  5. Hypokalemia. We will supplement. This is likely due to diuretic therapy.  6. Smoking. The patient has been counseled about smoking cessation for more than three minutes. We will provide her with a nicotine patch.  7. Left adrenal mass. The patient denies any abdominal problems. She can have a work-up as an outpatient with her primary care physician at [facility].   I reviewed old records, discussed with the patient the plan of care and management.   TIME SPENT:  75 minutes.   ____________________________ Cherre Huger, MD sp:ap D: 12/31/2011 11:36:54 ET T: 12/31/2011 12:23:50 ET JOB#: [id number]  cc: Cherre Huger, MD, <Dictator> [facility], Dr. Antonieta Loveless MD ELECTRONICALLY SIGNED 12/31/2011 14:20

## 2014-05-14 NOTE — Discharge Summary (Signed)
PATIENT NAME:  [name], [name] MR#:  [id number] DATE OF BIRTH:  [birth year]  DATE OF ADMISSION:  12/31/2011 DATE OF DISCHARGE:  01/02/2012  DIAGNOSES AT ADMISSION: Pleuritic chest pain and shortness of breath with malignant hypertension.   DIAGNOSES AT DISCHARGE:  1. Pleuritic chest pain.  2. Atelectasis.  3. Hypoxemia.  4. Malignant hypertension.  5. Hyperglycemia with hemoglobin A1c of 6.  6. Hypokalemia.  7. Smoking. 8. Left adrenal mass.  9. Fatty liver infiltration.  MEDICATIONS AT DISCHARGE:  1. K-Dur 10 mg extended release twice daily.  2. Hydralazine 50 mg 3 times a day.  3. Lasix 40 mg once a day. 4. Metoprolol succinate 100 mg once a day.  5. Melatonin 10 mg once a day.  6. Lisinopril 40 mg once a day.  7. Hydrochlorothiazide 25 mg once a day.  8. Aspirin 81 mg once daily.  9. Acetaminophen with hydrocodone 325/5 mg p.r.n. pain.  10. Tussionex  5 mL every 12 hours as needed for cough.    DISPOSITION: Home.   CONDITION AT DISCHARGE: Good.   HOSPITAL COURSE: Ms. [name] is a very nice 49 year old female who has history of anxiety, depression, hypertension, gout who has presented to the ER on 12/31/2011 with a history of new onset sharp pain right chest under her right breast. The pain was associated with some nausea but not necessarily vomiting. She felt very short of breath at the moment and had decreased oxygen saturations with oxygen sats in the mid 80s. The patient denies having any trauma, falls or doing anything that will create musculoskeletal pain and there are no bruises on her chest. Patient was admitted for observation. She was noted to have hypoxemia and significant elevation of her blood pressure with diastolics above 681. For that reason the patient was put on blood pressure medication management. She was ruled out for acute coronary syndrome. She had a CT scan of the chest due to her hypoxemia and significant pain and that was negative for pulmonary  embolism. Her CT showed enlargement of cardiac chambers but patient did not have any significant or clear findings of congestive heart failure. She had some decrease of oxygen saturation during this hospitalization. Repeat chest x-ray showed multiple atelectasis at the level of the right lower lung lobe which is due to her pain. Patient is not significant deep breaths and she is accumulating secretions. We put her on incentive spirometry and today we were able to get her off of oxygen. She is ambulating and she had some decreased oxygen saturations in the morning but after some nebulizers they improved. Her hypoxia is due to atelectasis and she has been recommended (1) stop smoking (2) ambulate (3) continue to use the incentive spirometer daily. As far as her other medical problems, her hyperglycemia, patient likely has impaired fasting glucose. Her hemoglobin A1c is 6. We recommended just to keep a good diet with low carbohydrate intake.   Hypocalcemia likely due to her diuretic therapy and she is to continue replacement.   Smoking cessation. Patient has been counseled for more than three minutes at admission. I counseled for five minutes on discharge and patient says that she is not ready to stop smoking.   Incidental finding of left adrenal mass on CT scan. Patient has her primary care physician at [facility] and she will require a follow up CT scan of the adrenal gland within six months.   TIME SPENT: I spent about 40 minutes with this patient today  in discharge.   ____________________________ [place] Sink, MD rsg:cms D: 01/02/2012 13:44:53 ET T: 01/02/2012 14:15:17 ET JOB#: [id number]  cc: [place]oy Sink, MD, <Dictator> Lesley Galentine America Brown MD ELECTRONICALLY SIGNED 01/09/2012 14:06

## 2014-05-17 NOTE — H&P (Signed)
PATIENT NAME:  [name], [name] MR#:  [id number] DATE OF BIRTH:  [birth year]  REFERRING PHYSICIAN: Dr. Karma Greaser.   PRIMARY CARE PHYSICIAN: Dr. Jonna Clark at [facility].   The patient goes to [facility]  family medicine.   CHIEF COMPLAINT: Chest pain and numbness of the upper extremities.   HISTORY OF PRESENT ILLNESS: Mrs. [name] is a nice 49 year old female with history of obstructive sleep apnea, hypertension, COPD, history of PE, and a previous CVA.   The patient comes here to the hospital today with a history of chest pain for two days with increased shortness of breath. Apparently, the chest pain has been intermittently lasting for several minutes and then goes away by itself. Today, she has had beginning of chest pain in the morning with radiation to the middle of the chest and upper chest with numbness of bilateral upper extremities. The patient started sweating profusely and the pain lasted for 30 minutes. Intensity of the pain was 8 out of 10, and it was described as a hit on her chest and some pressure. She associated shortness of breath right away with chest pain and then improved after the chest pain went away.   Everything started today at around 1:00 for which she called her sister to bring her into the ER. She used her inhaler for shortness of breath and that did not work. She had some palpitations whenever she laid down and rested. The pain started whenever she was sitting down on the couch.   The patient is evaluated in the ER. In the ER, she has normal cardiac enzymes, but her potassium is very low at 2.6.   Other than that, the patient looks hemodynamically stable. Her blood pressure has been well controlled and she has been taking her Xarelto on a regular basis.   A D-dimer was done and it was just slightly elevated at 0.47 for what the suspicion of PE is very low, and she does have history of PE in the past and she is on Xarelto.   REVIEW OF SYSTEMS: A 12-system review of systems is  done.  CONSTITUTIONAL: Denies any fever, fatigue, weakness, weight loss or weight gain.  EYES: No double vision, blurry vision.  ENT: No tinnitus or difficulty swallowing. Positive sleep apnea.  RESPIRATORY: No cough, wheezing, hemoptysis or positive COPD. Positive occasional shortness of breath.  CARDIOVASCULAR: Positive chest pain as described above. No orthopnea. No edema. No syncope. Positive palpitations today.  GASTROINTESTINAL: No nausea, vomiting, abdominal pain, constipation, diarrhea.  GENITOURINARY: No dysuria, hematuria or changes in frequency.  GYNECOLOGICAL: No breast masses. ENDOCRINE: No polyuria, polydipsia, polyphagia, cold or heat intolerance.  HEMOLYMPHATIC: No anemia, easy bruising or swollen glands.  MUSCULOSKELETAL: No significant neck pain, back pain or gout.  NEUROLOGIC: No numbness, weakness, dysarthria, or epilepsy.  PSYCHIATRIC: No significant insomnia.  NEUROLOGICAL: Positive for CVA.   PAST MEDICAL HISTORY:  1.  Hypertension.  2.  COPD.  3.  History of pulmonary embolus.  4.  CVA.  5.  Fatty liver infiltration.  6.  Obesity.  7.  History of previous cocaine abuse.  8.  Gout.  9.  Depression.  10.  Anxiety.   PAST SURGICAL HISTORY:  1.  Varicose vein surgery.  2.  Vein stripping.  3.  Open reduction and internal fixation of fracture, upper extremities, forearms.  4.  Open reduction and internal fixation of left ankle.   SOCIAL HISTORY:  1.  The patient is still a smoker. She smokes maybe 1  or 2 cigarettes a month, but before she used to smoke heavily. Smoking cessation counseling has been given for 3 minutes. The patient agreed to quit smoking today. Education about the risk of smoking and the benefits of quitting smoking given to the patient.  2.  The patient states that she drinks a couple of beers on and off but not daily.  3.  She denies any use of drugs since 2010. Before that, she is used to do cocaine. She stopped  after having a stroke.  4.   She lives by herself. She is on disability, separated from her husband.   FAMILY HISTORY: Positive for hypertension in her father. Her mother was healthy.   CURRENT MEDICATIONS: Hyman Hopes 1 capsule once a day, Xarelto 20 mg once a day, metoprolol 100 mg once daily, melatonin 10 mg once daily, lisinopril 40 mg once a day, hydralazine 50 mg three times daily, furosemide 20 mg once a day, DuoNebs 0.5/2.5 mg four times a day as needed, Combivent as needed for shortness of breath, aspirin 81 mg once daily.   PHYSICAL EXAMINATION:  VITAL SIGNS: Blood pressure 155/92, pulse 77, respirations 20, temperature 97.6, oxygen saturation 92% on room air.  GENERAL: Alert and oriented x3, in no acute distress. No respiratory distress. Hemodynamically stable.  HEENT: Pupils are equal and reactive. Extraocular movements are intact. Mucosa are moist. Anicteric sclerae. Pink conjunctivae. No oral lesions.  NECK: Supple. No JVD. No thyromegaly. No adenopathy. No rigidity.  CARDIOVASCULAR: Regular rate and rhythm. Positive murmur of systolic ejection murmur  2/6. No rubs or gallops. No displacement of PMI. No reproduction of tenderness to palpation of anterior chest wall.  LUNGS: Clear without any wheezing or crepitus. No use of accessory muscles.  ABDOMEN: Soft, nontender, nondistended. No hepatosplenomegaly. No masses. Bowel sounds are positive.  EXTREMITIES: No edema, cyanosis or clubbing. Pulses +2. Capillary refill less than three.  NEUROLOGIC: Cranial nerves II through XII intact. Strength is equal in all four extremities. DTRs +2.  LYMPHATIC: Negative for lymphadenopathy in the neck or supraclavicular areas.  SKIN: No rashes, petechiae or any new lesions.  MUSCULOSKELETAL: No significant joint effusions or edema or erythema of the joints.  PSYCHIATRIC: Alert, oriented x3. Normal affect.   LABORATORY AND RADIOLOGICAL DATA: Creatinine is 0.9. BNP is 588. Glucose is 143. Total protein is 7. Albumin is 3.1.  Potassium level is 2.6. Sodium level is 143. White count is 11.7. Hemoglobin 11.9. D-dimer is 0.47.   CHEST X-RAY: No acute abnormalities.   EKG: No ST depression or elevation. The patient has normal sinus rhythm with LPH.   ASSESSMENT AND PLAN: A 49 year old female presented with chest pain, history of hypertension, chronic obstructive pulmonary disease,  pulmonary embolism, and previous cerebrovascular accident.   1.  Chest pain. The patient is admitted for evaluation of her chest pain. She is seen by family practice at [facility]. Apparently, she has had a Myoview scan, but it has been over a year. We are going to repeat one today. I am going to ask for results of this. We area going to start her on aspirin, add nitroglycerin.  2.  Heparin subcutaneously only for prophylaxis and at this moment, the patient is going to be on a statin. Her blood pressure is slightly elevated with elevation of the heart rate, for what we are going to add on low-dose beta blocker with metoprolol.  3.  There are no changes in her cardiac enzymes and no changes in her EKG for what  we are going to just do a Myoview in the morning and continuation of cycling on the cardiac enzymes. 4.  The patient is asymptomatic at this moment and hemodynamically stable.  5.  Hyperglycemia. Check a hemoglobin A1c. The patient has no history of diabetes in the past.  6.  Chronic obstructive pulmonary disease. Continue treatment with the Spiriva.  7.  Hypertension. Add on a beta blocker.  8.  History of previous pulmonary embolism. Continue Xarelto. This woman has low suspicion for pulmonary embolism.  9.  Other medical problems are stable.   CODE STATUS: FULL CODE.   TIME SPENT: I spent about 45 minutes with this patient.     ____________________________ Plevna Sink, MD rsg:np D: 09/11/2012 17:26:12 ET T: 09/11/2012 18:23:19 ET JOB#: [id number]  cc: Ozora Sink, MD, <Dictator> Francille Wittmann America Brown  MD ELECTRONICALLY SIGNED 09/22/2012 12:33

## 2014-05-17 NOTE — Discharge Summary (Signed)
PATIENT NAME:  [name], [name] MR#:  [id number] DATE OF BIRTH:  10/26/1965  DATE OF ADMISSION:  09/11/2012 DATE OF DISCHARGE:  09/13/2012  DISCHARGE DIAGNOSES: 1.  Acute on chronic diastolic congestive heart failure.  2.  Mild chronic obstructive pulmonary disease exacerbation.  3.  Pleuritic chest pain.  4.  Hyperlipidemia.  5.  Uncontrolled hypertension.  6.  Anxiety.   IMAGING STUDIES DONE: Include a chest x-ray which showed mild pulmonary edema.   ADMITTING HISTORY AND PHYSICAL: Please see detailed H and P dictated by Dr. Laurin Coder. In brief, a 49 year old female patient with history of PE and DVT, on Xarelto, presented to the Emergency Room complaining of shortness of breath, chest pain. The patient had a d-dimer done which was elevated for which she had a CT scan of the chest done which showed no pulmonary embolism. She also had a stress test done which was negative. The patient was found to have acute on chronic diastolic CHF for which she was started on IV Lasix with COPD exacerbation on steroids with which she improved well. Was chest pain-free by the day of discharge, which was pleuritic, likely musculoskeletal. She was discharged home on increased dose of p.o. Lasix and steroids in a stable condition. The patient did have episodes of respiratory failure in the past, but did not qualify for oxygen, and this time she was saturating 92% on room air at rest, but desaturated to 78% on ambulation and was set up for home O2 for COPD.   On the day of discharge, she does not have any wheezing or crackles on exam and is significantly improved.   DISCHARGE MEDICATIONS: Include:  1.  Prednisone 60 mg tapered over 6 days.  2.  Aspirin 81 mg daily.  3.  Lisinopril 40 mg oral once a day.  4.  Xarelto 20 mg oral daily.  5.  Metoprolol succinate 100 mg oral daily.  6.  DuoNeb 3 mL inhaled 4 times a day as needed.  7.  Spiriva 18 mcg inhaled once a day.  8.  Potassium chloride 20 mEq oral once a  day.  9.  Lasix 20 mg 2 tablets oral once a day.  10.  Melatonin 10 mg oral once a day.  11.  Hydralazine 50 mg oral 3 times a day.   DISCHARGE INSTRUCTIONS: The patient was discharged home on a low-salt diet. Activity as tolerated. Follow up with primary care physician in 1 to 2 weeks. Daily fluids less than 2 liters.   TIME SPENT: On day of discharge in discharge activity was 35 minutes. ____________________________ Leia Alf Dontae Minerva, MD srs:sb D: 09/15/2012 07:30:24 ET T: 09/15/2012 08:03:35 ET JOB#: [id number]  cc: Alveta Heimlich R. Darvin Neighbours, MD, <Dictator> Neita Carp MD ELECTRONICALLY SIGNED 09/15/2012 12:36

## 2014-05-17 NOTE — H&P (Signed)
PATIENT NAME:  [name], [name] MR#:  [id number] DATE OF BIRTH:  11/14/1965  DATE OF ADMISSION:  02/19/2012  PRIMARY CARE PHYSICIAN: Dr. Jonna Clark at [facility].   REFERRING PHYSICIAN: Dr. Lurline Hare.   CHIEF COMPLAINT: Chest pain.   HISTORY OF PRESENT ILLNESS: The patient is a 49 year old Caucasian female with history of obstructive sleep apnea syndrome, severe systemic hypertension, and she is ex-chronic smoker. The last admission to this hospital was this month on 01/13 when she was called to be admitted after finding that 2 blood cultures were growing gram-positive cocci. However, the second day her blood cultures grew up staphylococcal hominis and it was felt as contamination from skin flora. The patient came to the hospital this time stating that she is having chest pain for a while. It is pleuritic in nature, located at both sides of the lower ribs, sharp in nature, sometimes 10 on a scale of 10. This is ongoing for a weeks, could not specify and she states that while she was watching the TV show, Doctor Oz, she indicated that there was middle-aged female that show was describing her symptoms with pulmonary embolism. The patient indicates that she has been same symptoms and therefore she came to the Emergency Department and indeed doing CAT scan of the chest with contrast revealed a right upper lobe pulmonary embolism. The patient was started on anticoagulation with heparin after a bolus and now in the process to be admitted to the hospital.   PAST MEDICAL HISTORY: The last couple of admissions, the patient was in December the 6th, she came with a pleuritic chest pain. It is worth to mention that she had CTA of the chest and was negative for pulmonary embolism. The second admission was on January the 13th, this month, she was called because of positive blood cultures which later grew up staphylococcus hominis. Her past history also includes malignant hypertension, fatty liver infiltration, obesity,  hyperglycemia. However, her hemoglobin A1c was 6 recently. A finding of left adrenal mass to have outpatient follow-up. The patient is ex-chronic smoker. There is also a history of cocaine abuse in the past and she quit in 2010, gout, depression and anxiety.   PAST SURGICAL HISTORY: Surgery for varicose veins. She had venous stripping. Open reduction and internal fixation for fracturing both forearms, also open reduction and internal fixation for left ankle fracture.  FAMILY HISTORY: Father suffered from hypertension and cancer, the type is unknown. Her mother is healthy.   SOCIAL HABITS: Ex-chronic smoker. She quit 2 months ago. She used to smoke cocaine but she quit in 2010 after having stroke.   SOCIAL HISTORY: She lives alone and she is on disability. She is separated from her husband.   ADMISSION MEDICATIONS: Metoprolol succinate 100 mg once a day, melatonin 1 tablet at night, lisinopril 40 mg a day, Lasix 40 mg a day, Klor-Con potassium 20 mEq once a day, hydrochlorothiazide 25 mg a day, hydralazine 50 mg 3 times a day, aspirin 81 mg a day, Fosamax 5 mL twice a day p.r.n., tiotropium 1 inhalation once a day and Combivent inhaler 1 puff 4 times a day.   ALLERGIES: PENICILLIN CAUSING SKIN RASH AND ITCHING.   PHYSICAL EXAMINATION:  VITAL SIGNS: Blood pressure 105/76, respiratory 20, pulse 82, temperature 98.2, pulse oximetry 90%.  GENERAL APPEARANCE: A young female lying in bed in no acute distress.  HEAD AND NECK: No pallor. No icterus. No cyanosis.  EARS: Normal hearing, no lesions, no ulcers, no discharge. Nasal mucosa  was normal. No bleeding, no discharge, no ulcers. The oropharyngeal area showed normal lips and tongue without oral thrush, no ulcers, no exudates.  EYES: Normal eyelids and conjunctivae. The pupils are constricted, the patient received morphine. Both pupils are equal in size.  NECK: Supple. Trachea at midline. No thyromegaly. No cervical lymphadenopathy. No masses.  HEART:  Normal S1, S2. No S3, S4. No murmur. No gallop. No carotid bruits.  RESPIRATORY: Normal breathing pattern without use of accessory muscles. No rales. No wheezing.  ABDOMEN: Soft without tenderness. No hepatosplenomegaly. No masses. No hernias.  SKIN: No ulcers. No subcutaneous nodules. There are a few small varicose veins on the lower extremities. No palpable cords, Homan sign was negative.  MUSCULOSKELETAL: No joint swelling. No clubbing.  NEUROLOGIC: Cranial nerves II through XII are intact. No focal motor deficit.  PSYCHIATRIC: The patient is alert and oriented x 3. Mood and affect were normal.   LABORATORY, DIAGNOSTIC, AND RADIOLOGICAL DATA: EKG shows normal sinus rhythm at rate of 86 per minute. Left axis deviation, otherwise unremarkable EKG except for a tendency towards high voltage may suggest LVH. CAT scan of the chest with contrast showed filling defects within the segmental branches of the right upper lobe consistent with a small pulmonary emboli. Small infiltrates or atelectasis in the right lower lobe. Old rib fractures were noted. Old left clavicle fracture. Stable 3.5 cm left adrenal mass.   LABORATORY, DIAGNOSTIC, AND RADIOLOGICAL DATA: Serum glucose 157, B-type natriuretic peptide was 213, BUN 30, creatinine 1.1, sodium 142, potassium 2.8, calcium was 8.8. CPK 84. Troponin 0.04. CBC showed white count of 16,000, hemoglobin 13, hematocrit 41, platelet count 308. Prothrombin time 12. INR 0.9. APTT 25.   ASSESSMENT:  1. Pulmonary embolism involving the right upper lobe with a small pulmonary emboli.  2. Small infiltrates in the right lower lobe more consistent with atelectasis.  3. Severe systemic hypertension.  4. Obstructive sleep apnea syndrome, on CPAP at night. 5. Hypokalemia. 6. Left adrenal mass seen incidentally by CAT scan in December. It did not change during CAT scan of the current one.  7. Obesity. 8. Fatty liver infiltration.   9. A history of stroke in 2010.  PLAN:  The patient in the process to be admitted to the hospital, IV heparin bolus was given, then IV drip according to the normogram. I will start the patient on Coumadin after receiving the heparin. Continue current medications. Potassium replacement to correct the hypokalemia. I had a concern about the severe hypertension during the last admission and the association hypokalemia in the presence of adrenal mass. I ordered a 24-hour urine collection last admission for VMA and catecholamines, norepinephrine and dopamine all came back to be normal.   TIME SPENT EVALUATING THIS PATIENT AND REVIEWING MEDICAL RECORDS: Took more than one hour.    ____________________________ Clovis Pu. Lenore Manner, MD amd:jm D: 02/19/2012 06:25:21 ET T: 02/19/2012 10:19:26 ET JOB#: [id number] cc: Clovis Pu. Lenore Manner, MD, <Dictator> Ellin Saba MD ELECTRONICALLY SIGNED 02/19/2012 22:43

## 2014-05-17 NOTE — Discharge Summary (Signed)
PATIENT NAME:  [name], [name] MR#:  [id number] DATE OF BIRTH:  [birth year]  DATE OF ADMISSION:  02/19/2012 DATE OF DISCHARGE:  02/21/2012  ADMITTING DIAGNOSES: 1.  Pulmonary embolism involving right upper lobe with small pulmonary emboli. 2.  Small infiltrate in right lower lobe consistent with atelectasis.  3.  Severe systemic hypertension.  4.  Obstructive sleep apnea on CPAP at night. 5.  Hypokalemia. 6.  Left adrenal mass seen by CAT scan in December.  7.  Obesity. 8.  Fatty liver infiltration. 9.  History of stroke.   DISCHARGE DIAGNOSES:   1.  Pulmonary embolism. 2.  Left-sided pleuritic chest pain.  3.  Right lower lobe bacterial pneumonia per CT scan.  4.  Leukocytosis, resolved.  5.  Dehydration. 6.  Hypokalemia.  7.  Prediabetes. New onset with hemoglobin A1c 6.4.  8.  Obesity.  9.  Hypertension. 10.  Obstructive sleep apnea.   DISCHARGE CONDITION: Stable.   DISCHARGE MEDICATIONS:  1.  Lisinopril 40 mg p.o. daily.  2.  Metoprolol succinate 100 mg p.o. once daily.  3.  Combivent respimat CFC free 20/100, 1 puff 4 times daily.  4.  DuoNebs 0.5/2.5 mg in 3 mL inhalation solution 4 times daily.  5.  Ipratropium 18 mcg 1 capsule inhalation daily.  6.  Aspirin 81 mg p.o. daily.  7.  Melatonin 10 mg p.o. at bedtime.  8.  Levaquin 750 mg p.o. daily for 4 days. 9.  Tussionex 10 mg/8 mg in 5 mL oral suspension, 5 mL twice a day.  10.  Hydralazine 50 mg p.o. 3 times daily.  11.  Percocet 5/325 mg 1 tablet every 4 hours as needed.  12.  Xarelto 15 mg p.o. twice daily for 21 days and then change to 20 mg p.o. daily dose for at least 6 months.  13.  The patient is not to take Lasix or hydrochlorothiazide or prednisone or potassium. 14.  Home oxygen: Portable tank at 2 liters of oxygen through nasal cannula.   DIET: 2 grams salt, low fat, low cholesterol, carbohydrate controlled diet.  The patient was advised to lose weight.  DIET CONSISTENCY:  Regular.   ACTIVITY  LIMITATIONS:  As tolerated.  FOLLOWUP: Followup appointment with PCP at [facility] in 2 to 3 days after discharge.   CONSULTANTS: Dr. Kallie Edward, oncology.  RADIOLOGIC STUDIES: Chest xray January 25th revealed a mid left clavicular fracture with anterior angulation, old healed left rib fractures. CT scan of chest for pulmonary embolism with IV contrast on 02/19/2012, revealed right upper lobe pulmonary embolus filling defects. There is minimal right lung base atelectasis, stable left adrenal nodule present. Old bone fractures were also seen.  CT of the abdomen and pelvis with contrast on 02/20/2012 because of  upper quadrant abdominal discomfort showed no acute abdominal or pelvic abnormality. Hepatic steatosis was present. There is right lower lobe pneumonia with left lung base atelectasis, old right pubic fracture was present and nonobstructing right renal calculus was also present. Ultrasound of bilateral lower extremities: Doppler ultrasound of bilateral lower extremities showed no DVT in either lower extremity.   HOSPITAL COURSE:   The patient is a 49 year old African American female with past medical history significant for history of obstructive sleep apnea, hypertension, as well as obesity who was watching TV show and Dr. Irena Cords was talking about pleuritic chest pain as well as shortness of breath, so she decided to come to the Emergency Room because she had very similar symptoms and she had  a CT scan done and was noted to have right upper lobe and pulmonary embolus. She was admitted to the hospital for further evaluation. On admission to the hospital, her vital signs revealed that she was not hypoxic, her heart rate remained stable at 72, blood pressure was 120/80's respiration rate was normal at 18 and her oxygen saturation was 95% on 2 liters of oxygen through nasal cannula. She was started on heparin initially for her pulmonary embolism, however, heparin was changed to Xarelto.  The patient was  advised to continue Xarelto 15 mg p.o. twice daily dose for 21 days and then have a new prescription filled at 20 mg p.o. once daily dose. The patient is to continue this medication for at least 6 months. The patient was consulted by Dr. Kallie Edward for hypercoagulable state.  Dr. Kallie Edward saw the patient in consultation on 02/20/2012.  She felt that she needs to be worked up for hypercoagulable state with factor V Leiden as well as prothrombin gene mutation studies and recommended to also have lupus anticoagulant testing when the patient is off heparin and follow up with her in the outpatient cancer center in approximately 2 weeks after discharge.  The patient did overall satisfactory.  We took some of her hypercoagulable state tests which are still pending at the time of dictation.  The patient is to follow up with Dr. Kallie Edward and make decisions about prolonged therapy with Xarelto if needed. Meanwhile, she is to continue oxygen therapy as well as Xarelto.  She was tested for O2 need and her oxygen saturation was 89% on room air at rest. She was advised to continue oxygen at 2 to 3 liters of oxygen through nasal cannula at rest as well as on exertion.   In regards to bacterial pneumonia, the patient was started on Levaquin and she is to continue antibiotics for 4 more days to complete the course.  We attempted to get sputum cultures. However, she was not able to produce any.    The patient was noted to have mild leukocytosis in the hospital with a white blood cell count of 16.1 on 02/19/2012; however, white blood cell count normalized by the 27th of January 2014 and was 10.4. It was felt to be probably stress pain as well as pneumonia related.   The patient was noted to be somewhat a little dehydrated, having hypokalemia while in the hospital. She was rehydrated with IV fluids and her potassium level was supplemented.   The patient was hyperglycemic.  She had a hemoglobin A1c checked which was found to be high  at 6.4. She was felt to be a prediabetic.  Diabetic education was consulted as well as dietitian. The patient was advised to continue diabetic diet and lose weight if possible.   For hypertension as well as obstructive sleep apnea, no other changes were made. She is to continue her usual outpatient medications.   The patient is being discharged in stable condition with above-mentioned medications and followup. Her vital signs on the day of discharge: Temperature 97.6, pulse 82, respirations 22, blood pressure 133/89, saturation was 95% on 3 liters of oxygen through nasal cannula at rest.   TIME SPENT: 40 minutes.      ____________________________ Theodoro Grist, MD rv:ct D: 02/21/2012 19:48:24 ET T: 02/22/2012 08:55:17 ET JOB#: [id number]  cc: Theodoro Grist, MD, <Dictator> [place]. Kallie Edward, MD   Theodoro Grist MD ELECTRONICALLY SIGNED 03/16/2012 18:18

## 2014-05-17 NOTE — H&P (Signed)
PATIENT NAME:  [name], [name] MR#:  [id number] DATE OF BIRTH:  Jan 31, 1965  DATE OF ADMISSION:  02/07/2012  PRIMARY CARE PHYSICIAN: Jonna Clark, MD at [facility] [place].   REFERRING PHYSICIAN: Charlesetta Ivory, MD.   CHIEF COMPLAINT: Cough and shortness of breath, fever and pneumonia. The patient was also told that she has bacteremia.   HISTORY OF PRESENT ILLNESS: The patient is a 49 year old Caucasian female with history of severe systemic hypertension, obstructive sleep apnea syndrome, ex-chronic smoker, who presented 2 days ago to the Emergency Department with cough, congestion, wheezing, fever and chills. She was treated as an outpatient for bronchitis, however, the chest x-ray report came back that she has left upper lobe pneumonia.  Additionally, 2 blood cultures grew out a gram-positive cocci. The patient brought back again, she appears to be hypoxemic with O2 saturation in the 80s.  She was placed on BiPAP. The patient  uses CPAP at night. Additional blood cultures were taken and the patient was started on IV Levaquin.   REVIEW OF SYSTEMS:  CONSTITUTIONAL: She reports fever and chills. Mild fatigue. No night sweats.  EYES: No blurring of vision. No double vision.  ENT: No hearing impairment. No sore throat. No dysphagia.  CARDIOVASCULAR: No chest pain but reports shortness of breath. No edema. No syncope.  RESPIRATORY: Reports cough, shortness of breath and some wheezing. No hemoptysis. No chest pain.  GASTROINTESTINAL: No abdominal pain, no vomiting, no diarrhea.  GENITOURINARY: No dysuria. No frequency of urination.  MUSCULOSKELETAL: No joint pain or swelling. No muscular pain or swelling.  INTEGUMENTARY: No skin rash. No ulcers.  NEUROLOGY: No focal weakness. No seizure activity. No headache.  PSYCHIATRY: No anxiety. No depression.  ENDOCRINE: No polyuria or polydipsia. No heat or cold intolerance.  HEMATOLOGY: No easy bruisability. No lymph node enlargement.   PAST MEDICAL  HISTORY:  Last admission to this hospital was in December 6th and discharged on December 7th after treatment for pleuritic chest pain.  It was  also noted that she had hypokalemia. At that time she had CTA of the chest that was negative for pulmonary embolism.  Her history also includes left adrenal mass which was recently found to have outpatient follow-up. Malignant hypertension, hypokalemia and hyperglycemia with recent hemoglobin A1c of 6, fatty liver infiltration and obesity. Ex- chronic smoker.  History of cocaine abuse. She quit at 2010. There is also a history of anxiety, depression and gout.  PAST SURGICAL HISTORY:  Open reduction and internal fixation for fracturing both forearms, also open and reduction and internal fixation for left ankle fracture.   FAMILY HISTORY:  Her father had hypertension. Also, he suffered from cancer but she does not know what type of cancer. Her mother is healthy.   SOCIAL HABITS:  Ex-chronic smoker. She quit 2 months ago. She used to smoke crack cocaine, the last use was in 2010 after having stroke.   SOCIAL HISTORY: She is separated from her husband. She lives alone and she lives on disability.   ADMISSION MEDICATIONS:  Tussionex 5 mL twice a day p.r.n.  Metoprolol succinate 100 mg once a day.  Melatonin once a day at night.  Lisinopril 40 mg a day. Lasix 40 mg a day. Klor-Con 20 mEq once a day.  Hydrochlorothiazide 25 mg a day.  Hydralazine 50 mg 3 times a day. Aspirin 81 mg a day.   ALLERGIES:  PENICILLIN CAUSING SKIN RASH AND ITCHING.  PHYSICAL EXAMINATION: VITAL SIGNS: Blood pressure 170/111, subsequent blood pressure now systolic  is 200.  Respiratory rate is 20, pulse 80, O2 saturation 96% while she is on oxygen and BiPAP treatment.  GENERAL APPEARANCE: Young female, obese, lying down in bed with the BiPAP on in no acute distress.  HEAD AND NECK: No pallor. No icterus. No cyanosis.  ENT: Ear examination revealed normal hearing. No discharge. No lesions.  Nasal mucosa was normal, no discharge, no lesions, no ulcers. Lips, mouth and tongue were normal without oral thrush, no ulcers.  EYES: Normal iris and conjunctivae. Pupils are constricted, about 2 to 3 mm, could not see reactivity to light. Both pupils were equal in size.   NECK: Supple. Trachea at midline. No thyromegaly. No cervical lymphadenopathy. No masses.  HEART: Normal S1, S2. No S3 or S4. No murmur. No gallop. No carotid bruits.  LUNGS: Slight tachypnea without use of accessory muscles. A few scattered wheezing. No rales. No rhonchi.  ABDOMEN: Soft, obese without tenderness. No hepatosplenomegaly. No masses. No hernias.  SKIN: No ulcers. No subcutaneous nodules. On the lower extremities there are a few very small varicosities.  MUSCULOSKELETAL: No joint swelling. No clubbing.  NEUROLOGIC: Cranial nerves II through XII are intact. No focal motor deficit.  PSYCHIATRIC: The patient is alert and oriented x 3. Mood and affect were normal.   LABORATORY AND DIAGNOSTIC DATA:   Chest x-ray showed left upper lobe infiltrate. Serum glucose 149, B-type natriuretic peptide (BNP) was 288, BUN 20, creatinine 0.9, sodium 141, potassium 3.2. Normal liver function tests and transaminases. CBC showed white count of 16,000.  Her white count on January 11th was 10. Hemoglobin 12, hematocrit 38, platelet count 343.  Two blood cultures done 2 days ago are growing gram-positive cocci. ABG showed pH of 7.47, CO2 44, pO2 68.   ASSESSMENT: 1.  Left upper lobe pneumonia.  2.  Bacteremia with gram-positive cocci. So far no evidence of septicemia. 3.  Obstructive sleep apnea syndrome, uses CPAP at night.  4.  Severe systemic hypertension.  5.  Mild hypokalemia.  6.  Recent incidental finding of left adrenal mass. 7.  Mild hyperglycemia may indicate borderline diabetes mellitus with her recent hemoglobin A1c being 6. 8.  Obesity.  9.  Fatty liver infiltration.  10.  Tobacco abuse. She quit smoking 2 months ago.   11.  History of stroke in 2010.   PLAN: We will admit the patient to the telemetry unit. Blood cultures x 2 were taken. IV antibiotic using Levaquin. We will continue BiPAP for now. However, during the day we will use nasal cannula.  Potassium supplementation. Bronchodilator therapy.  Continue treatment of hypertension with her home medications. Additionally, may give amlodipine 5 mg twice a day p.r.n. for systolic blood pressure more than 580 or diastolic more than 998. Additionally, may give hydralazine intravenously needed. Meanwhile, given the recent finding of adrenal mass and the presence of severe hypertension and also the observation of recurrent hypokalemia, although the latter can be explained by the usage of oral Lasix and hydrochlorothiazide; however, I think it is necessary to explore the patient for pheochromocytoma.  I ordered a 24-hour urine collection for VMA and catacholamines.    Time spent evaluating this patient:  Took more than 55 minutes.    ____________________________ Clovis Pu. Lenore Manner, MD amd:ct D: 02/07/2012 06:27:51 ET T: 02/07/2012 09:54:14 ET JOB#: [id number]  cc: Clovis Pu. Lenore Manner, MD, <Dictator> Ellin Saba MD ELECTRONICALLY SIGNED 02/08/2012 7:19

## 2014-05-17 NOTE — Consult Note (Signed)
PATIENT NAME:  [name], [name] MR#:  [id number] DATE OF BIRTH:  06-Mar-1965  DATE OF CONSULTATION:  02/20/2012  CONSULTING PHYSICIAN:  Manya Silvas, MD  REASON FOR CONSULTATION: The patient is a 49 year old black female who was admitted to the hospital for a pulmonary embolism. I was asked to see her for left upper quadrant abdominal pain.   HISTORY OF PRESENT ILLNESS: The patient has a history of obstructive sleep apnea, and obesity and severe systemic hypertension. She was watching a TV show, and someone on Dr. Williams Che show talked about their symptoms of pleuritic pain and chest pain; and she went to the ER because she had the same symptoms, and they did a CT and diagnosed a right upper lobe pulmonary embolus. She was admitted to the hospital, and I was asked to see her for left upper quadrant pain that she also had complaints of. The patient was seen December 6th for her pleuritic chest pains and had a CTA that was negative for pulmonary embolism.    PAST MEDICAL HISTORY: In the past, she has been found to have malignant hypertension, fatty liver, obesity, hyperglycemia, hemoglobin A1c of 6 and a small 3 cm adrenal left adrenal mass.   PAST SURGICAL HISTORY: Surgery for varicose veins, open reduction and internal fixation for fracture of both forearms and surgery of her left ankle.   FAMILY HISTORY: Hypertension and cancer.   HABITS: Ex-smoker, quit 2 months ago, history of cocaine years, quit in 2010 after having a stroke. She lives alone and is on Disability.   MEDICATIONS: Metoprolol 100 mg a day, lisinopril 40 mg a day, Lasix 40 mg a day, potassium 20 mEq a day, HCTZ 25 mg a day, hydralazine 50 mg 3 times a day, aspirin 81 mg a day, Fosamax 5 mL twice a day, Tiotropium inhaler once a day, Combivent inhaler 1 puff 4 times a day.   ALLERGIES: PENICILLIN CAUSES SKIN RASH AND ITCHING.   REVIEW OF SYSTEMS: The patient says that she has been coughing and having pain in her chest and upper  abdomen since the 1st of January. She has gained about 11 pounds in the last months She attributes a lot of it to ankle swelling. 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  She had never had an upper endoscopy nor colonoscopy. She has pain across the abdomen, worse in the left upper quadrant and in the lower left chest. The pain hurts worse when she takes a deep breath, and when she coughs it definitely hurts worse. She had a bowel movement this morning. The bowel movement did not produce any change in the pain. She had loose dark brown stool. She has noticed that she has urination more when she walks, and she has been having increased urination since she has been having the coughing.  She denies any hematemesis.   PHYSICAL EXAMINATION: GENERAL: Obese female looks somewhat short of breath.  VITAL SIGNS: Temperature 98.3, pulse 84, blood pressure 126/85, 92% saturation on 2 liters.  ABDOMEN: Obese, she has minimal tenderness in the lower abdomen. She has more tenderness in the upper abdomen bilaterally, worse on the left; but she has even worse tenderness in the ribs on the left. Her abdominal tenderness increases when she tries to raise her head off the bed.   LABORATORY AND RADIOLOGICAL DATA:  Her CAT scan showed only a left adrenal adenoma.  Beta-type natriuretic peptide 213, glucose 157, BUN 30, creatinine 1.1, sodium  142, potassium 2.8, repeat 3.6, chloride 100. Magnesium 2.2. Hemoglobin A1c 6.4. White count 16, PT 13.6, PTT is elevated because she is on heparin.   ASSESSMENT: Her abdominal pain is due to coughing for the last month. It is very tender to palpation, but there is nothing to correlate with the tenderness on the CAT scan except a small adrenal nodule. Her tenderness is worse when she raises her head off the bed, indicating that this is pain in the muscles of the abdomen rather than the deep organs. No further work-up is recommended at this time. It would be reasonable  to check an H. pylori antibody titer, and if she is positive to treat her. That would make her less likely to develop gastric problems while on anticoagulation. I see no reason for an endoscopy at this time, nor for a lower bowel study.  ____________________________ Manya Silvas, MD rte:cb D: 02/20/2012 18:26:01 ET T: 02/20/2012 18:39:19 ET JOB#: [id number]  cc: Manya Silvas, MD, <Dictator> Theodoro Grist, MD [place]is Pu. Lenore Manner, MD Manya Silvas MD ELECTRONICALLY SIGNED 03/16/2012 7:53

## 2014-05-17 NOTE — Consult Note (Signed)
CC:abd and cheest pain.on left.  Her pain is musculoskeletal in nature and she does not need GI work up for this pain.  Recommend check for H. pylori and if positive to treat this to lessen the chance of gastric or duodenal mucosal disease that could cause bleeding while on anticoagulation.  I will order an H. pylori test.  Electronic Signatures: Manya Silvas (MD)  (Signed on 26-Jan-14 18:31)  Authored  Last Updated: 26-Jan-14 18:31 by Manya Silvas (MD)

## 2014-05-17 NOTE — Consult Note (Signed)
Brief Consult Note: Diagnosis: Pulmonary embolus.   Recommend further assessment or treatment.   Comments: Would recommend hypercoaguable workup with Factor V leiden,Prothrombin gene mutation,.Will do lupus anticoagulant testing when off heparin and will followup as outpatient in cancer center in 2 weeks..  Electronic Signatures: Georges Mouse (MD)  (Signed 26-Jan-14 14:56)  Authored: Brief Consult Note   Last Updated: 26-Jan-14 14:56 by Georges Mouse (MD)

## 2014-05-17 NOTE — H&P (Signed)
PATIENT NAME:  [name], [name] MR#:  [id number] DATE OF BIRTH:  September 21, 1965   ADDENDUM   10.  Hypokalemia. The patient has severe hypokalemia which is likely why she was having the numbness on the upper extremities. At this moment, we are going to replace with 40 mEq of Kaycel and then we are going to recheck and supply again if necessary.   Other than that, the patient is doing fine.   ____________________________ [place] Sink, MD rsg:np D: 09/11/2012 17:28:59 ET T: 09/11/2012 20:06:20 ET JOB#: [id number]  cc: [place] Sink, MD, <Dictator> Cayne Yom America Brown MD ELECTRONICALLY SIGNED 09/22/2012 12:33

## 2014-05-17 NOTE — Discharge Summary (Signed)
PATIENT NAME:  [name], [name] MR#:  [id number] DATE OF BIRTH:  November 04, 1965  DATE OF ADMISSION:  02/07/2012 DATE OF DISCHARGE:  02/08/2012  REASON FOR ADMISSION: The patient called back due to blood cultures showing bacteremia.   Please refer to previous discharge summary dictated on 01/02/2012 by me and also ED addendum of 02/06/2012 where the patient is called for gram cocci anaerobe blood cultures.   HOSPITAL COURSE: [name] is a very nice 49 year old female who has history of systemic hypertension, obstructive sleep apnea and ex-smoker who came to the ER two days prior to this admission complaining of cough, congestion, wheezing, fevers and chills. The patient was diagnosed with acute bronchitis and treated with outpatient antibiotics with Levaquin. The patient was seen in the ER and the x-rays show left upper lobe pneumonia. On top of that, the patient had gram-positive cocci in blood, in 2 bottles, for what the patient was called and she was asked to come and be examined and admitted for treatment of this condition. The bacteremia was mostly due to possible contamination, there was no evidence of septicemia and her white count was elevated at 16,000. On January 13th, prior to that, it was 10,000, but the patient had been given prednisone on discharge from the ER. This elevation of white count will be on account of steroid induced. The patient was afebrile, again did not have any significant findings of sepsis or septicemia, not even systemic inflammatory response syndrome. Her blood pressure was normal and occasionally elevated and her heart rate remained in the 60s to 70s. The patient cultures were reevaluated and they show Staphylococcus hominis which is very likely to be skin flora contamination. Even though the patient was treated with antibiotics due to her pneumonia and continuation of the levofloxacin, this Staphylococcus that grew on her blood cultures was sensitive to the same drugs. Blood  cultures were taken again, on the 13th, and it showed no growth by discharge. The patient is discharged in good condition and not having          significant shortness of breath, although she requested to have a nebulizer at home, where we prescribed one.   TIME SPENT: I spent about 40 minutes with this discharge.  ____________________________ [place] Sink, MD rsg:sb D: 02/09/2012 06:57:21 ET T: 02/09/2012 09:16:58 ET JOB#: [id number]  cc: [place] Sink, MD, <Dictator> Rolen Conger America Brown MD ELECTRONICALLY SIGNED 02/18/2012 10:58

## 2014-05-18 NOTE — H&P (Signed)
PATIENT NAME:  LEONARD, [name] MR#:  [id number] DATE OF BIRTH:  04/09/1965  DATE OF ADMISSION:  01/14/2014  REFERRING PHYSICIAN: Gretchen Short. Beather Arbour, MD  PRIMARY CARE PHYSICIAN: Nonlocal.   ADMISSION DIAGNOSES: Hypertensive urgency and abdominal pain.   HISTORY OF PRESENT ILLNESS: This is a 49 year old African American female who presents to the Emergency Department complaining of abdominal pain for 4 days. More accurately, the patient complained of chest pain that radiated into her abdomen. She was found to not have any cardiac abnormalities, but did have uncontrolled blood pressure with systolics greater than 213. The patient also states that she has had 4 loose stools prior to admission and was vomiting nonbloody, nonbilious emesis since yesterday. She denies chest pain or shortness of breath at this time. She also denies any melena or hematochezia. In the Emergency Department, she was given hydralazine and labetalol IV, but her blood pressure barely improved, which prompted the Emergency Department staff to call for admission.   REVIEW OF SYSTEMS:  CONSTITUTIONAL: Denies fever or weakness.  EYES: Denies blurred vision or inflammation.  EARS, NOSE AND THROAT: Denies tinnitus or sore throat.  RESPIRATORY: Denies cough or shortness of breath.  CARDIOVASCULAR: Denies chest pain or palpitations.  GASTROINTESTINAL: Admits to nausea, vomiting and diarrhea as well as abdominal pain.  GENITOURINARY: Denies dysuria, increased frequency, or hesitancy of urination.  ENDOCRINE: Denies polyuria or polydipsia.  HEMATOLOGIC AND LYMPHATIC: Denies easy bruising or bleeding. INTEGUMENT: Denies rashes or lesions.  MUSCULOSKELETAL: Denies myalgias or arthralgias.  NEUROLOGIC: Denies numbness in extremities or dysarthria.  PSYCHIATRIC: Denies suicidal ideation or depression.   PAST MEDICAL HISTORY: History of CVA, hypertension, COPD, gout, depression, and obesity.   PAST SURGICAL HISTORY: The patient has had open  reduction and internal fixation of her bilateral upper extremities, as well as her left lower extremity.   SOCIAL HISTORY: The patient denies smoking or any illicit drug use. She admits to occasional alcohol ingestion.   FAMILY HISTORY: Her father has hypertension and her sister has breast cancer, as do multiple maternal relatives.   MEDICATIONS:  1.  Fioricet 325mg  /50 mg/40 mg 1 tablet p.o. every 6 hours; no more than 2 times per day.  2.  Albuterol 90 mcg inhaler 1-2 puffs every 4-6 hours as needed for coughing, wheezing, or shortness of breath.  3.  Allopurinol 100 mg 1 tablet p.o. b.i.d.  4.  Amlodipine 10 mg 1 tablet p.o. daily.  5.  Aspirin 81 mg 1 tablet p.o. daily.  6.  Atorvastatin 20 mg 1 tablet p.o. daily.  7.  Clonidine 0.1 mg 1 tablet p.o. b.i.d.  8.  Hydralazine 50 mg 1 tablet p.o. t.i.d.  9.  Lisinopril 40 mg 1 tablet p.o. daily.  10.  Melatonin 10 mg 1 capsule p.o. at bedtime.  11.  Metoprolol succinate 100 mg extended release 1 tablet p.o. daily.  12.  Omeprazole 20 mg 1 tablet p.o. daily.  13.  Ondansetron 8 mg disintegrating tablet 1 tablet every 8 hours as needed for nausea or vomiting.  14.  Spironolactone 25 mg 1 tablet p.o. daily.  15.  Tiotropium 18 mcg 1 capsule inhaled daily.   ALLERGIES: MORPHINE AND PENICILLIN.   PERTINENT LABORATORY RESULTS AND RADIOGRAPHIC FINDINGS: Serum glucose is 108, BUN is 15, creatinine 0.72, serum sodium 143, potassium is 3.4, chloride 108, bicarbonate 27, calcium 8.6, lipase 147, serum albumin 3.2, alkaline phosphatase 93, AST 43, ALT 39. Troponin is negative. White blood cell count is 8.2, hemoglobin is 13.2,  hematocrit 41.3, platelet count 286,000. MCV is 86. Urine is negative for infection. CT of the abdomen and pelvis with contrast shows hepatic steatosis with a nodular contour of the liver concerning for early cirrhosis. There is also a stable left adrenal nodule that is likely benign. Some cardiomegaly is also seen.   PHYSICAL  EXAMINATION:  VITAL SIGNS: Temperature is 97.8, pulse 59, respirations 20, blood pressure 181/106, pulse oximetry is 95% on room air.  GENERAL: The patient is alert and oriented x 3, in no apparent distress.  HEENT: Normocephalic, atraumatic. Pupils equal, round and reactive to light and accommodation. Extraocular movements are intact. Mucous membranes are moist.  NECK: Trachea is midline. No adenopathy.  CHEST: Symmetric, atraumatic.  CARDIOVASCULAR: Regular rate and rhythm. Normal S1, S2. No rubs, clicks, or murmurs appreciated.  LUNGS: Clear to auscultation bilaterally. Normal effort and excursion.  ABDOMEN: Positive bowel sounds. Soft. Diffusely tender, without guarding or rebound tenderness. No hepatosplenomegaly.  GENITOURINARY: Deferred.  MUSCULOSKELETAL: The patient moves all 4 extremities equally. There is 5/5 strength in upper and lower extremities bilaterally.  SKIN: No rashes or lesions.  EXTREMITIES: No clubbing, cyanosis, but there is trace pretibial edema.  NEUROLOGIC: Cranial nerves II through XII are grossly intact.  PSYCHIATRIC: Mood is normal. Affect is congruent.   ASSESSMENT AND PLAN: This is a 49 year old female admitted for hypertensive urgency and abdominal pain.  1.  Hypertension urgency. The patient has been given labetalol and hydralazine IV, but her blood pressure is barely improved. Her heart rate is in the low 60s, which limits increasing our dosing of beta blocker or alpha blockers. She is on all other categories of antihypertensive medicines. I suspect that she had some difficulty with compliance with her medication. I will restart all of her home medicines and give her morning dose early.  2.  Abdominal pain, likely multifactorial. The patient may be or having some somatized  pain to her viscera from uncontrolled hypertension. It also sounds like she has a viral gastroenteritis. Physical examination is reassuring for a nonsurgical abdomen. We will manage her pain  and nausea. We will advance her diet as tolerated.  3.  Chronic obstructive pulmonary disease, stable. Continue Spiriva.  4.  Gout. Continue allopurinol.  5.  Depression. Continue sertraline.  6.  Obesity. The patient's body mass index is 33.7. I encouraged diet and exercise.  7.  Deep vein thrombosis prophylaxis with heparin.  8.  Gastrointestinal prophylaxis. None, as the patient is not critically ill.   CODE STATUS: The patient is a Full Code.   TIME SPENT ON ADMISSION ORDERS AND PATIENT CARE: Approximately 35 minutes.    ____________________________ Norva Riffle. Marcille Blanco, MD msd:MT D: 01/14/2014 08:26:58 ET T: 01/14/2014 08:53:11 ET JOB#: [id number]  cc: Norva Riffle. Marcille Blanco, MD, <Dictator> Norva Riffle Dylynn Ketner MD ELECTRONICALLY SIGNED 01/25/2014 0:16

## 2014-05-18 NOTE — H&P (Signed)
PATIENT NAME:  [name], [name] MR#:  [id number] DATE OF BIRTH:  30-Nov-1965  DATE OF ADMISSION:  08/20/2013  PRIMARY CARE PHYSICIAN: At [facility].   REFERRING EMERGENCY ROOM PHYSICIAN: Dr. Delman Kitten.  CHIEF COMPLAINT:  Hypertension, headache and vomiting.   HISTORY OF PRESENT ILLNESS: A 49 year old female with a history of hypertension, COPD, pulmonary embolism, not on anticoagulation any more, cerebrovascular accident, fatty liver infiltrate, obesity, previous cocaine abuse in the past, gout, depression and anxiety, who was in the hospital 10 days ago and was discharged after having the same complaints of nausea, vomiting and headache and was discharged home after having controlling the blood pressure properly and advised to follow with primary care physician. She stated that she was taking all of her medications regularly as prescribed and she had scheduled appointment with her primary care physician today early morning, but she started having severe nausea, vomiting and headache and was feeling extremely uneasy with abdominal pain since last night, and so decided to come to the Emergency Room instead of going to her primary care doctor's office today.  In the ER, she was given an injection of hydralazine and was given oral hydralazine, clonidine and lisinopril and her blood pressure started to come under control, but she still stayed with headache and nausea, and because of recurrent complaint and having finding of left adrenal mass on last admission, she was given to hospitalist team for further observation.   REVIEW OF SYSTEMS: CONSTITUTIONAL: Negative for fever, fatigue, weakness, pain or weight loss.  EYES: No blurring, double vision, discharge or redness.  EARS, NOSE, THROAT: No tinnitus, ear pain or hearing loss.  RESPIRATORY: No cough, wheezing, hemoptysis or shortness of breath.  CARDIOVASCULAR: No chest pain, orthopnea, edema, arrhythmia, palpitation.  GASTROINTESTINAL: The  patient has some nausea, vomiting. No diarrhea. Mild abdominal pain.  GENITOURINARY: No dysuria, hematuria or increased frequency.  ENDOCRINE: No heat or cold intolerance.  SKIN: No abscesses, swelling, acne or rashes.  MUSCULOSKELETAL: No pain, swelling in the joints.  NEUROLOGICAL: No numbness, weakness, tremor or vertigo.  PSYCHIATRIC: No anxiety, insomnia, bipolar disorder.   PAST MEDICAL HISTORY: 1.  Hypertension.  2.  Chronic obstructive pulmonary disease.  3.  History of pulmonary embolism, not on anticoagulation.  4.  Fatty liver infiltrate.  5.  Obesity.  6.  History of previous cocaine abuse.  7.  Gout.  8.  Depression.  9.  Anxiety.  10.  Left adrenal mass.   PAST SURGICAL HISTORY:   1.  Varicose vein surgery.  2.  Vein stripping surgery.  3.  Open reduction and internal fixation of fracture, upper extremity on forearm.  4.  Open reduction and internal fixation of left ankle.   SOCIAL HISTORY: The patient used to be a heavy smoker, smokes cigarettes occasionally, 1 every few days. Used to have history of cocaine abuse in the past, none for the last few years. She stopped after she had cerebrovascular accident. Denied any alcohol abuse.   FAMILY HISTORY: Significant for hypertension in her father.   HOME MEDICATIONS: 1.  Spiriva 18 mcg once a day.  2.  Spironolactone 25 mg oral once a day.  3.  Sertraline 100 mg oral once a day. 4.  Ondansetron 8 mg oral every 8 hours as needed for nausea and vomiting.  5.  Omeprazole 20 mg oral once a day.  6.  Metoprolol 100 mg oral once a day.  7.  Metformin 10 mg oral once a day at  nighttime.  8.  Lisinopril 40 mg oral tablet once a day.  9.  Hydralazine 50 mg oral 3 times a day.  10.  Clonidine 0.1 mg oral 2 times a day.  11.  Atorvastatin 20 mg oral once a day.  12.  Aspirin 81 mg once a day.  13.  Amlodipine 10 mg oral once a day.  14.  Allopurinol 100 mg oral 2 times a day.  15.  ipratropium 3 mL inhalation 4 times a  day.   PHYSICAL EXAMINATION:  VITAL SIGNS:  In the ER, temperature 97.6. Pulse is 70, respirations 18, blood pressure 185/90 unilateral, which came down to 150/105 during my examination.  GENERAL: The patient is fully alert and oriented, obese, in mild distress due to headache and nausea.  HEENT: Head and neck atraumatic. Conjunctivae pink. Oral mucosa moist.  NECK: Supple. No JVD.  RESPIRATORY: Bilateral equal and clear air entry.  CARDIOVASCULAR: S1, S2 present, regular. No murmurs. ABDOMEN:  Soft, mild tenderness in the left side. No organomegaly felt. Bowel sounds normal.  SKIN: No acne, rashes or lesions.  MUSCULOSKELETAL: No pain or swelling in the joints.  NEUROLOGIC: No numbness, weakness. Moves all 4 limbs. Follows commands. No tremor.  PSYCHIATRIC: No anxiety. Does not appear in any acute psychiatric problem at this time.   IMPORTANT LABORATORY RESULTS: Glucose 85, BUN 10, creatinine 0.77. Sodium is 143, potassium 3.1. Chloride is 107. CO2 is 26. Calcium is 9.2 and lipase is 123. Total protein is 7.2. Albumin is 3.2, bilirubin 0.5, alkaline phos 70, SGOT 21 and SGPT 23. Troponin is less than 0.02. WBC 8.1, hemoglobin 12.1, platelet count is 301. MCV is 85.   ASSESSMENT AND PLAN: A 49 year old female with history of hypertension, chronic obstructive pulmonary disease, cerebrovascular accident, fatty liver infiltrate, obesity, previous cocaine abuse, gout, depression, anxiety, who was recently admitted to hospital last week with complaint of similar headache, hypertension and abdominal pain and found having adenoma, came with a similar complaint in spite of taking her blood pressure medications regularly. Blood pressure is coming down after getting medications in ER.  1.  Uncontrolled hypertension. Currently, after receiving 10 mg IV hydralazine and oral medications, blood pressure is coming down, so we will continue monitoring. We will admit for observation and give her oral usual home  medications for now.  2.  Adrenal mass. On the left adrenal gland, there is a mass, 3 cm, with this complaint of recurrent headache, vomiting and hypertension. Would like to call endocrinology consult.  Renin and angiotensin levels were checked on last admission, which appears to be within range.  3.  Hypokalemia, replace orally and recheck tomorrow.  4.  Chronic obstructive pulmonary disease. Currently there is no acute wheezing. Continue Spiriva and nebulizer therapy.  5.  Depression. Continue sertraline.   CODE STATUS: Full code.   TOTAL TIME SPENT ON THIS ADMISSION: 50 minutes    ____________________________ Ceasar Lund Anselm Jungling, MD vgv:dmm D: 08/20/2013 14:36:00 ET T: 08/20/2013 15:05:14 ET JOB#: [id number]  cc: Ceasar Lund. Anselm Jungling, MD, <Dictator> Vaughan Basta MD ELECTRONICALLY SIGNED 08/21/2013 16:16

## 2014-05-18 NOTE — Discharge Summary (Signed)
PATIENT NAME:  [name], [name] MR#:  [id number] DATE OF BIRTH:  1965-05-10  DATE OF ADMISSION:  08/07/2013 DATE OF DISCHARGE:  08/08/2013  PRIMARY CARE PHYSICIAN:  Dr. Arcelia Jew  FINAL DIAGNOSES:  1. Malignant hypertension.  2. Hypokalemia. Can consider primary hyperaldosteronism. Follow up on renin and aldosterone levels.  3. Nausea and vomiting.  4. Chronic obstructive pulmonary disease.  5. Depression.  6. History of cerebrovascular accident.   MEDICATIONS ON DISCHARGE: Include aspirin 81 mg daily, DuoNeb nebulizer solution 3 mL 4 times a day as needed, Spiriva 1 inhalation daily, melatonin 10 mg at bedtime, lisinopril 40 mg daily, Zoloft 100 mg daily, metoprolol 100 mg daily, hydralazine 50 mg 3 times a day, oxycodone 5 mg every 6 hours as needed for pain, spironolactone 25 mg daily, clonidine 0.1 mg twice a day.   Stop taking Lasix and potassium.   ACTIVITY: As tolerated.   FOLLOWUP:  1-2 days Dr. Arcelia Jew at [facility].   HOSPITAL COURSE: The patient was admitted 08/07/2013, discharged 08/08/2013. Came in with headache, abdominal pain, vomiting, diarrhea.  Was admitted with hypertensive urgency, started on nicardipine drip to control blood pressure, to the ICU. Supportive care for the abdominal pain, nausea, vomiting.  For the hypokalemia, most likely related to the vomiting and diarrhea, was replaced. Also, the patient is on Lasix as outpatient.   LABORATORY AND RADIOLOGICAL DATA DURING THE HOSPITAL COURSE: Included a troponin that was negative. White blood cell count 10.7, H and H 12.1 and 38.4, platelet count of 314,000, glucose 108, BUN 13, creatinine 0.85, sodium 144, potassium 2.9, chloride 105, CO2 of 31, calcium 8.1, magnesium 2.2. Urine toxicology negative. Pregnancy test negative. Urinalysis negative.  EKG: Sinus rhythm, premature atrial complexes, aberrant conduction, rightward axis. CT scan of the head: Negative. CT scan of the abdomen: No acute findings. Diffuse fatty infiltration of  the liver, unchanged left adrenal lesion cannot be definitely characterized, but likely represents an atypical adenoma given its stability. White blood cell count upon discharge 8.7, hemoglobin 11.3, creatinine 1.01, potassium 3.5.   Hospital course per problem list:  1. For the patient's malignant hypertension, I added clonidine and Aldactone to the patient's blood pressure. Her blood pressure upon discharge is 115/78. I stopped the nicardipine drip, kept her usual medications.  2. Hypokalemia. I stopped her Lasix. Can consider primary hyperaldosteronism. I sent off a renin and aldosterone level. Can consider this because the patient likely has an adrenal adenoma on CT scan, malignant hypertension, hypokalemia.  This must be followed up as outpatient by Dr. Arcelia Jew 3. Nausea and vomiting. This had resolved. Tolerating diet.  4. COPD. Respiratory status stable during the hospital course.  5. Depression. The patient is on Zoloft.  6. History of CVA. On aspirin.   TIME SPENT ON DISCHARGE: 35 minutes.    ____________________________ Tana Conch. Leslye Peer, MD rjw:dd D: 08/08/2013 16:26:05 ET T: 08/08/2013 18:04:55 ET JOB#: [id number]  cc: Tana Conch. Leslye Peer, MD, <Dictator> Dr. Mervyn Skeeters' at [place] SIGNED 08/09/2013 12:42
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
## 2014-05-18 NOTE — Discharge Summary (Signed)
PATIENT NAME:  [name], DOHN MR#:  [id number] DATE OF BIRTH:  Jan 24, 1966  DATE OF ADMISSION:  08/20/2013 DATE OF DISCHARGE:  08/21/2013  ADMISSION DIAGNOSES:  1.  Uncontrolled hypertension.  2.  Adrenal mass.   DISCHARGE DIAGNOSES: 1.  Uncontrolled hypertension.  2.  Adrenal mass seen on CT scan.  3.  Headache.   CONSULTATIONS: Endocrine. Dr Gabriel Carina  LABORATORIES AT DISCHARGE: White blood cells 10.8, hemoglobin 12, hematocrit 38.1, platelets 303,000, sodium 141, potassium 3.2, chloride 104, bicarbonate 28, BUN 8, creatinine 0.97, glucose 88. TSH was within normal limits.   CT of the abdomen performed on 08/07/2013, no acute findings. There is an unchanged left adrenal lesion that cannot be definitively characterize, but likely represents atypical adenoma.   CT of the head on 07/14, also, was negative.   HOSPITAL COURSE:  A 49 year old female who presents on July 27 with hypertension, headache, and vomiting. For further details, please refer to the H and P.    1.  Uncontrolled  hypertension is suspected. Symptoms predominantly from uncontrolled blood pressure. She was recently started on several medications.  She came in with elevated blood pressure.  Blood pressure is improved back on her home medications.   2.  Headache likely from hypertension.  She had no neurological deficits.  A CT of the head was performed on the 14th, which essentially showed no acute changes.  I suspect this could be also from migraine.  She tried Fioricet, which did help some with the pain.   3.  Adrenal mass. The patient will be seen by Dr. Gabriel Carina and further work-up will be done as an outpatient.   4.  Depression. The patient will continue Zoloft.   5.  History of chronic obstructive pulmonary disease. This was stable.   DISCHARGE MEDICATIONS:  1.  Aspirin 81 mg daily.  2.  Tiotropium 18 mcg daily. 3.  Spironolactone 25 mg daily.  4.  Lisinopril 40 mg daily. 5.  Clonidine 0.1 b.i.d. 6.  Metoprolol 100  mg daily. 7.  Hydralazine 50 mg b.i.d.  8.  Albuterol 1 to 2 puffs q. 4-6 hours p.r.n.  9.  Allopurinol 100 mg b.i.d.  10.  Norvasc 10 mg daily.  11.  Atorvastatin 20 mg daily.  12.  Albuterol ipratropium 3 mL 4 times a day p.r.n.  13.  Zoloft 100 mg daily.  14.  Melatonin 10 mg daily.  15.  Omeprazole 20 mg daily. 16.  Zofran 8 mg q. 8 hours p.r.n.  17.  Fioricet 1 tablet q. 6 hours p.r.n. pain, #10.   DISCHARGE DIET: Low sodium.   DISCHARGE ACTIVITY: As tolerated.  DISCHARGE FOLLOW UP:  Patient can follow up with her primary care physician in 1 week and Dr. Gabriel Carina in 2 weeks.   TIME SPENT: 35 minutes.   CONDITION: The patient is stable for discharge.    ____________________________ Decklyn Hornik P. Benjie Karvonen, MD spm:ts D: 08/21/2013 12:15:18 ET T: 08/21/2013 13:03:25 ET JOB#: [id number]  cc: Akaysha Cobern P. Benjie Karvonen, MD, <Dictator> A. Lavone Orn, MD Donell Beers Davi Kroon MD ELECTRONICALLY SIGNED 08/22/2013 14:52

## 2014-05-18 NOTE — H&P (Signed)
PATIENT NAME:  [name], RINGER MR#:  [id number] DATE OF BIRTH:  Jul 21, 1965  DATE OF ADMISSION:  08/06/2013  ATTENDING PHYSICIAN: Dr. Lisa Roca.  PRIMARY CARE PHYSICIAN: [facility].   CHIEF COMPLAINT: Headache, abdominal pain, vomiting, and diarrhea.   HISTORY OF PRESENT ILLNESS: This is a 49 year old female with known history of obstructive sleep apnea, hypertension, COPD, history of PE used to be on Xarelto but no more, and history of previous CVA. The patient presents with above-mentioned complaints. Reports  they have been going on for the last day, started yesterday afternoon. Reports she has been having headache, intermittent. Denies any focal deficits, any tingling, any numbness, any loss of consciousness, any visual disturbances. The patient reports her blood pressure was uncontrolled at home systolic being around 469 to 230. The patient was hypertensive in ED. She was given multiple doses of IV labetalol without significant improvement of her blood pressure so patient was started on IV nicardipine drip. CT head did not show any acute findings. As well patient complains of abdominal pain, vomiting, nausea, and diarrhea, but there is no recurrence of diarrhea or vomiting here in ED as well. The patient reports has been taking her medication regularly, but she did ran out of lisinopril and sertraline over the last week but reports has been taking the rest of the medication. She denies any chest pain, any shortness of breath, any coffee-ground emesis, any bright red blood per rectum.   PAST MEDICAL HISTORY:  1. Hypertension.  2. COPD.  3. History of PE, not on anticoagulation anymore.  4. CVA.  5. Fatty liver infiltration.  6. Obesity.  7. History of previous cocaine abuse.  8. Gout.  9. Depression.  10. Anxiety.   PAST SURGICAL HISTORY:  1. Varicose vein surgery.  2. Vein stripping.  3. Open reduction and internal fixation of fracture upper extremity forearm. 4. Open  reduction and internal fixation of left ankle.   SOCIAL HISTORY: The patient used to be a heavy smoker, smokes cigarettes occasionally 1 every few days. Used to have history of cocaine abuse in the past, but none for last few years. She stopped after she had a CVA. Denies any alcohol abuse.   FAMILY HISTORY: Significant for hypertension in her father.   REVIEW OF SYSTEMS:  GENERAL: Denies fever, chills. Reports fatigue, weakness.  EYES: Denies blurry vision, double vision, inflammation.  ENT: Denies tinnitus, ear pain, hearing loss, epistaxis. RESPIRATORY: Denies cough, wheezing, hemoptysis, dyspnea.   CARDIOVASCULAR: Denies chest pain, edema, palpitation, syncope.  GASTROINTESTINAL: Reports nausea, vomiting, abdominal pain, diarrhea.  GENITOURINARY: Denies dysuria, hematuria, renal colic.  ENDOCRINE: Denies polyuria, polydipsia, heat or cold intolerance.  HEMATOLOGY: Denies anemia, easy bruising, bleeding diathesis.  INTEGUMENT: Denies acne, rash, or skin lesion.  MUSCULOSKELETAL: Denies any cramps, gout.  NEUROLOGIC: Denies any tremors, vertigo, ataxia, dementia, numbness, focal deficits. Reports headache and history of CVA in the past.  PSYCHIATRIC: Denies anxiety, insomnia, or depression.   PHYSICAL EXAMINATION:  VITAL SIGNS: Temperature 98.3, pulse 77, respiratory rate 23, blood pressure 123/77, saturating 99% on oxygen.  GENERAL: Obese female, looks comfortable in bed, in no apparent distress.  HEENT: Head atraumatic, normocephalic. Pupils are equal and reactive to light. Pink conjunctivae. Anicteric sclerae. Moist oral mucosa.  NECK: Supple. No thyromegaly. No JVD.  CHEST: Good air entry bilaterally. No wheezing, rales, rhonchi.  CARDIOVASCULAR: S1, S2 heard. No rubs, murmurs, gallops.  ABDOMEN: Mild tenderness to palpation but no rebound, no guarding. Bowel sounds present.  EXTREMITIES:  No edema. No clubbing. No cyanosis. Pedal pulses +2 bilaterally. Has good capillary  refills. NEUROLOGIC: Cranial nerves grossly intact. Strength is equal in all 4 extremities. Deep tendon reflexes +2. Sensation is symmetrical to light tough. LYMPHATIC: No cervical lymphadenopathy in the neck or supraclavicular area. MUSCULOSKELETAL: No joint effusion or erythema. SKIN: Normal skin turgor, warm and dry. PSYCHIATRIC: Awake, alert x 3. Intact judgment and insight.   PERTINENT LABORATORY DATA: Glucose 108, BUN 13, creatinine 0.85, sodium 144, potassium 2.9, chloride 105, CO2 of 31. Troponin less than 0.02. White blood cells 10.7, hemoglobin 12.1, hematocrit 38.4, platelet 314,000. Urinalysis negative for leukocyte esterase and nitrite.   IMAGING STUDIES: CT head without contrast showing no acute intracranial abnormality and mild chronic small vessel ischemic disease.   ASSESSMENT AND PLAN:  1. Hypertensive urgency. This is most likely the contributing factor to her headache. Patient currently on nicardipine drip with controlled blood pressure. She will be resumed on her home medication and if needed, we will add p.r.n. hydralazine so we can wean her off the nicardipine drip at this point.  2. Abdominal pain, nausea, vomiting. This is most likely related to viral gastroenteritis as she is afebrile, has no leukocytosis. Will check CT abdomen and pelvis with contrast.  3. Hypokalemia. This is most likely related to her diarrhea and vomiting. We will replete and will recheck in a.m.  4. History of chronic obstructive pulmonary disease. The patient has no wheezing. We will keep her on p.r.n. nebulizer treatments and resume her Spiriva.  5. History of cerebrovascular accident. We will continue the patient on aspirin.  6. Deep vein thrombosis prophylaxis. Subcutaneous heparin   CODE STATUS: Full code.   TOTAL TIME SPENT ON ADMISSION AND PATIENT CARE: 55 minutes.    ____________________________ Albertine Patricia, MD dse:lt D: 08/07/2013 05:41:28 ET T: 08/07/2013 06:06:38  ET JOB#: [id number]  cc: Albertine Patricia, MD, <Dictator> Tyrion Glaude Graciela Husbands MD ELECTRONICALLY SIGNED 08/08/2013 1:46

## 2014-05-22 NOTE — Discharge Summary (Signed)
PATIENT NAME:  [name], [name] MR#:  [id number] DATE OF BIRTH:  [birth year]  DATE OF ADMISSION:  01/14/2014 DATE OF DISCHARGE:  01/16/2014  ADMITTING PHYSICIAN:  Harrie Foreman, MD   DISCHARGING PHYSICIAN:  Gladstone Lighter, MD   PRIMARY CARE PHYSICIAN:  [place].   [place]: None.   DISCHARGE DIAGNOSES:  1.  Abdominal pain, musculoskeletal likely.  2.  Nausea and vomiting secondary to elevated blood pressure.  3.  Hypotensive urgency.  4.  Chronic obstructive pulmonary disease.  5.  Obstructive sleep apnea.  6.  Liver cirrhosis.   DISCHARGE HOME MEDICATIONS:   1. Spiriva 1 capsule inhalation daily.  2. Albuterol inhaler 1-2 puffs q. 4-6 hours p.r.n. for shortness of breath or wheezing.  3. DuoNebs with albuterol ipratropium 3 mL q. 6 hours as needed for shortness of breath. 4. Clonidine 0.1 mg p.o. b.i.d.  5. Lisinopril 40 mg p.o. daily.  6. Sertraline 100 mg p.o. daily.  7. Atorvastatin 20 mg p.o. daily.  8. Aspirin 81 mg p.o. daily.  9. Toprol 100 mg p.o. daily.  10. Allopurinol 100 mg p.o. daily.  11. Norvasc 10 mg p.o. daily.  12. Aldactone 25 mg p.o. daily.  13. Zofran 4 mg every 8 hours as needed for nausea or vomiting.   DISCHARGE DIET: Low-sodium diet.   DISCHARGE ACTIVITY: As tolerated.   FOLLOWUP INSTRUCTIONS:  1.  PCP followup in 1 week.  2.  GI Follow up for evaluation of liver cirrhosis. LABORATORIES AND IMAGING STUDIES PRIOR TO DISCHARGE: WBC 9.7, hemoglobin 12.7, hematocrit 40.5, platelet count 277,000.   Sodium 139, potassium 3.9, chloride 105, bicarbonate 28, BUN is 17, creatinine 1.36, glucose 90, calcium of 8.6. Troponins were negative. ABG showing pH of 7.37, pCO2 was 51, but pO2 of 65, bicarbonate of 29.5, and saturations of 94% on room air. CT of the chest, abdomen, and pelvis with contrast on admission showing hepatic steatosis, increased nodular contour of the liver concerning for at least early cirrhosis. Stable left  adrenal nodule likely benign noted. Urinalysis negative for any infection.   ALT 39, AST 43, alkaline phosphatase 93, total bilirubin 0.2, and albumin of 3.2. Lipase 147.   BRIEF HOSPITAL COURSE: Miss [name] is a 49 year old Caucasian female with past medical history significant for hypertension uncontrolled as an outpatient, history of migraine headaches, gout, COPD, obstructive sleep apnea, presented to the hospital secondary to abdominal pain, nausea, and vomiting.   1.  Abdominal pain likely musculoskeletal pain triggered from nausea and vomiting. The cause of her nausea and vomiting could be from her hypotensive urgency. Her CT of the abdomen was completely normal other than liver cirrhosis changes and her pain was nonspecific, all over the abdomen, no right upper quadrant tenderness. Symptoms resolved once blood pressure improved.  2.  Hypertensive urgency. States she has had blood pressure since in her teens.  She ran out of her medications, did not take it for a few days, and presented with hypotensive urgency.  She was in the ICU on nicardipine drip. Blood pressure improved and she is being discharged back on her home medications. She was advised to strongly follow up with PCP in a week.  3.  COPD exacerbation. Inhalers were continued. Her CPAP was continued in the hospital. She had a transient altered mental status episode after Phenergan and Dilaudid; however, improved on BiPAP here due to co2 was increased at the time, but pH was within normal limits.  4.  Liver  cirrhosis. Started on Aldactone. Outpatient followup by GI doctor recommended.  Her course has been otherwise uneventful in the hospital.   DISCHARGE CONDITION: Stable.   DISCHARGE DISPOSITION: Home.   TIME SPENT ON DISCHARGE: 40 minutes.     ____________________________ Gladstone Lighter, MD [id number] D: 01/16/2014 16:31:32 ET T: 01/16/2014 17:02:39 ET JOB#: [id number]  cc: Gladstone Lighter, MD, <Dictator> Gladstone Lighter MD ELECTRONICALLY SIGNED 01/29/2014 18:34

## 2015-02-16 ENCOUNTER — Encounter: Payer: Self-pay | Admitting: Emergency Medicine

## 2015-02-16 ENCOUNTER — Emergency Department: Payer: Medicaid Other

## 2015-02-16 ENCOUNTER — Emergency Department
Admission: EM | Admit: 2015-02-16 | Discharge: 2015-02-16 | Disposition: A | Discharge status: Home / Self Care | Payer: Medicaid Other | Attending: Emergency Medicine | Admitting: Emergency Medicine

## 2015-02-16 DIAGNOSIS — R05 Cough: Secondary | ICD-10-CM | POA: Insufficient documentation

## 2015-02-16 DIAGNOSIS — I10 Essential (primary) hypertension: Secondary | ICD-10-CM | POA: Insufficient documentation

## 2015-02-16 DIAGNOSIS — R519 Headache, unspecified: Secondary | ICD-10-CM

## 2015-02-16 DIAGNOSIS — Z79899 Other long term (current) drug therapy: Secondary | ICD-10-CM | POA: Insufficient documentation

## 2015-02-16 DIAGNOSIS — R51 Headache: Secondary | ICD-10-CM | POA: Insufficient documentation

## 2015-02-16 DIAGNOSIS — Z7982 Long term (current) use of aspirin: Secondary | ICD-10-CM | POA: Diagnosis not present

## 2015-02-16 DIAGNOSIS — R498 Other voice and resonance disorders: Secondary | ICD-10-CM | POA: Insufficient documentation

## 2015-02-16 DIAGNOSIS — R079 Chest pain, unspecified: Secondary | ICD-10-CM | POA: Insufficient documentation

## 2015-02-16 DIAGNOSIS — I159 Secondary hypertension, unspecified: Secondary | ICD-10-CM | POA: Insufficient documentation

## 2015-02-16 DIAGNOSIS — Z880 Allergy status to penicillin: Secondary | ICD-10-CM | POA: Insufficient documentation

## 2015-02-16 DIAGNOSIS — E669 Obesity, unspecified: Secondary | ICD-10-CM | POA: Insufficient documentation

## 2015-02-16 DIAGNOSIS — R110 Nausea: Secondary | ICD-10-CM | POA: Insufficient documentation

## 2015-02-16 HISTORY — DX: Essential (primary) hypertension: I10

## 2015-02-16 HISTORY — DX: Heart failure, unspecified: I50.9

## 2015-02-16 HISTORY — DX: Gout, unspecified: M10.9

## 2015-02-16 HISTORY — DX: Cerebral infarction, unspecified: I63.9

## 2015-02-16 HISTORY — DX: Unspecified atrial fibrillation: I48.91

## 2015-02-16 HISTORY — DX: Chronic obstructive pulmonary disease, unspecified: J44.9

## 2015-02-16 LAB — CBC
HCT: 38.6 % (ref 35.0–47.0)
Hemoglobin: 12.5 g/dL (ref 12.0–16.0)
MCH: 28.7 pg (ref 26.0–34.0)
MCHC: 32.5 g/dL (ref 32.0–36.0)
MCV: 88.4 fL (ref 80.0–100.0)
PLATELETS: 264 10*3/uL (ref 150–440)
RBC: 4.37 MIL/uL (ref 3.80–5.20)
RDW: 15 % — AB (ref 11.5–14.5)
WBC: 7.6 10*3/uL (ref 3.6–11.0)

## 2015-02-16 LAB — BASIC METABOLIC PANEL
Anion gap: 9 (ref 5–15)
BUN: 13 mg/dL (ref 6–20)
CHLORIDE: 110 mmol/L (ref 101–111)
CO2: 25 mmol/L (ref 22–32)
CREATININE: 0.72 mg/dL (ref 0.44–1.00)
Calcium: 8.7 mg/dL — ABNORMAL LOW (ref 8.9–10.3)
GFR calc Af Amer: >60 mL/min (ref >60)
GFR calc non Af Amer: >60 mL/min (ref >60)
GLUCOSE: 101 mg/dL — AB (ref 65–99)
Potassium: 3.1 mmol/L — ABNORMAL LOW (ref 3.5–5.1)
Sodium: 144 mmol/L (ref 135–145)

## 2015-02-16 LAB — TROPONIN I
Troponin I: <0.03 ng/mL (ref <0.031)
Troponin I: <0.03 ng/mL (ref <0.031)

## 2015-02-16 LAB — BRAIN NATRIURETIC PEPTIDE: B Natriuretic Peptide: 92 pg/mL (ref 0.0–100.0)

## 2015-02-16 MED ORDER — METOPROLOL SUCCINATE ER 50 MG PO TB24: 100.0000 mg | ORAL_TABLET | Freq: Every day | ORAL | Status: DC

## 2015-02-16 MED ORDER — HYDRALAZINE HCL 25 MG PO TABS
50.0000 mg | ORAL_TABLET | Freq: Once | ORAL | Status: AC
  Administered 2015-02-16: 50 mg via ORAL
  Filled 2015-02-16: qty 2

## 2015-02-16 MED ORDER — KETOROLAC TROMETHAMINE 30 MG/ML IJ SOLN
30.0000 mg | Freq: Once | INTRAMUSCULAR | Status: AC
  Administered 2015-02-16: 30 mg via INTRAVENOUS

## 2015-02-16 MED ORDER — LISINOPRIL 10 MG PO TABS
40.0000 mg | ORAL_TABLET | Freq: Once | ORAL | Status: AC
  Administered 2015-02-16: 40 mg via ORAL
  Filled 2015-02-16: qty 4

## 2015-02-16 MED ORDER — KETOROLAC TROMETHAMINE 30 MG/ML IJ SOLN
INTRAMUSCULAR | Status: AC
  Administered 2015-02-16: 30 mg via INTRAVENOUS
  Filled 2015-02-16: qty 1

## 2015-02-16 MED ORDER — CLONIDINE HCL 0.1 MG PO TABS
0.1000 mg | ORAL_TABLET | Freq: Once | ORAL | Status: AC
  Administered 2015-02-16: 0.1 mg via ORAL
  Filled 2015-02-16: qty 1

## 2015-02-16 MED ORDER — HYDRALAZINE HCL 20 MG/ML IJ SOLN
10.0000 mg | Freq: Once | INTRAMUSCULAR | Status: AC
  Administered 2015-02-16: 10 mg via INTRAVENOUS
  Filled 2015-02-16: qty 1

## 2015-02-16 MED ORDER — HYDROCHLOROTHIAZIDE 50 MG PO TABS: 50.0000 mg | ORAL_TABLET | Freq: Every day | ORAL | Status: DC

## 2015-02-16 MED ORDER — PANTOPRAZOLE SODIUM 40 MG IV SOLR
40.0000 mg | Freq: Once | INTRAVENOUS | Status: AC
  Administered 2015-02-16: 40 mg via INTRAVENOUS
  Filled 2015-02-16: qty 40

## 2015-02-16 MED ORDER — AMLODIPINE BESYLATE 5 MG PO TABS
10.0000 mg | ORAL_TABLET | Freq: Once | ORAL | Status: AC
  Administered 2015-02-16: 10 mg via ORAL
  Filled 2015-02-16: qty 2

## 2015-02-16 MED ORDER — SPIRONOLACTONE 25 MG PO TABS: 25.0000 mg | ORAL_TABLET | Freq: Every day | ORAL | Status: DC

## 2015-02-16 MED ORDER — POTASSIUM CHLORIDE CRYS ER 20 MEQ PO TBCR
40.0000 meq | EXTENDED_RELEASE_TABLET | Freq: Once | ORAL | Status: AC
  Administered 2015-02-16: 40 meq via ORAL
  Filled 2015-02-16: qty 2

## 2015-02-16 MED ORDER — OXYCODONE-ACETAMINOPHEN 5-325 MG PO TABS
2.0000 | ORAL_TABLET | Freq: Once | ORAL | Status: AC
  Administered 2015-02-16: 2 via ORAL
  Filled 2015-02-16: qty 2

## 2015-02-16 NOTE — Discharge Instructions (Signed)
1. Continue all of your daily home medications, especially your blood pressure medicines. 2. You may double your Clonidine dosage to 0.2 mg twice daily until you see your doctor next week. 3. Return to the ER for worsening symptoms, persistent vomiting, lethargy or other concerns.  Hypertension Hypertension, commonly called high blood pressure, is when the force of blood pumping through your arteries is too strong. Your arteries are the blood vessels that carry blood from your heart throughout your body. A blood pressure reading consists of a higher number over a lower number, such as 110/72. The higher number (systolic) is the pressure inside your arteries when your heart pumps. The lower number (diastolic) is the pressure inside your arteries when your heart relaxes. Ideally you want your blood pressure below 120/80. Hypertension forces your heart to work harder to pump blood. Your arteries may become narrow or stiff. Having untreated or uncontrolled hypertension can cause heart attack, stroke, kidney disease, and other problems. RISK FACTORS Some risk factors for high blood pressure are controllable. Others are not.  Risk factors you cannot control include:   Race. You may be at higher risk if you are African American.  Age. Risk increases with age.  Gender. Men are at higher risk than women before age 67 years. After age 42, women are at higher risk than men. Risk factors you can control include:  Not getting enough exercise or physical activity.  Being overweight.  Getting too much fat, sugar, calories, or salt in your diet.  Drinking too much alcohol. SIGNS AND SYMPTOMS Hypertension does not usually cause signs or symptoms. Extremely high blood pressure (hypertensive crisis) may cause headache, anxiety, shortness of breath, and nosebleed. DIAGNOSIS To check if you have hypertension, your health care provider will measure your blood pressure while you are seated, with your arm held  at the level of your heart. It should be measured at least twice using the same arm. Certain conditions can cause a difference in blood pressure between your right and left arms. A blood pressure reading that is higher than normal on one occasion does not mean that you need treatment. If it is not clear whether you have high blood pressure, you may be asked to return on a different day to have your blood pressure checked again. Or, you may be asked to monitor your blood pressure at home for 1 or more weeks. TREATMENT Treating high blood pressure includes making lifestyle changes and possibly taking medicine. Living a healthy lifestyle can help lower high blood pressure. You may need to change some of your habits. Lifestyle changes may include:  Following the DASH diet. This diet is high in fruits, vegetables, and whole grains. It is low in salt, red meat, and added sugars.  Keep your sodium intake below 2,300 mg per day.  Getting at least 30-45 minutes of aerobic exercise at least 4 times per week.  Losing weight if necessary.  Not smoking.  Limiting alcoholic beverages.  Learning ways to reduce stress. Your health care provider may prescribe medicine if lifestyle changes are not enough to get your blood pressure under control, and if one of the following is true:  You are 90-74 years of age and your systolic blood pressure is above 140.  You are 99 years of age or older, and your systolic blood pressure is above 150.  Your diastolic blood pressure is above 90.  You have diabetes, and your systolic blood pressure is over [id number] or your diastolic blood pressure  is over 90.  You have kidney disease and your blood pressure is above 140/90.  You have heart disease and your blood pressure is above 140/90. Your personal target blood pressure may vary depending on your medical conditions, your age, and other factors. HOME CARE INSTRUCTIONS  Have your blood pressure rechecked as directed by  your health care provider.   Take medicines only as directed by your health care provider. Follow the directions carefully. Blood pressure medicines must be taken as prescribed. The medicine does not work as well when you skip doses. Skipping doses also puts you at risk for problems.  Do not smoke.   Monitor your blood pressure at home as directed by your health care provider. SEEK MEDICAL CARE IF:   You think you are having a reaction to medicines taken.  You have recurrent headaches or feel dizzy.  You have swelling in your ankles.  You have trouble with your vision. SEEK IMMEDIATE MEDICAL CARE IF:  You develop a severe headache or confusion.  You have unusual weakness, numbness, or feel faint.  You have severe chest or abdominal pain.  You vomit repeatedly.  You have trouble breathing. MAKE SURE YOU:   Understand these instructions.  Will watch your condition.  Will get help right away if you are not doing well or get worse.   This information is not intended to replace advice given to you by your health care provider. Make sure you discuss any questions you have with your health care provider.   Document Released: 01/11/2005 Document Revised: 05/28/2014 Document Reviewed: 11/03/2012 Elsevier Interactive Patient Education 2016 [place] Headache Without Cause A headache is pain or discomfort felt around the head or neck area. The specific cause of a headache may not be found. There are many causes and types of headaches. A few common ones are:  Tension headaches.  Migraine headaches.  Cluster headaches.  Chronic daily headaches. HOME CARE INSTRUCTIONS  Watch your condition for any changes. Take these steps to help with your condition: Managing Pain  Take over-the-counter and prescription medicines only as told by your health care provider.  Lie down in a dark, quiet room when you have a headache.  If directed, apply ice to the head and  neck area:  Put ice in a plastic bag.  Place a towel between your skin and the bag.  Leave the ice on for 20 minutes, 2-3 times per day.  Use a heating pad or hot shower to apply heat to the head and neck area as told by your health care provider.  Keep lights dim if bright lights bother you or make your headaches worse. Eating and Drinking  Eat meals on a regular schedule.  Limit alcohol use.  Decrease the amount of caffeine you drink, or stop drinking caffeine. General Instructions  Keep all follow-up visits as told by your health care provider. This is important.  Keep a headache journal to help find out what may trigger your headaches. For example, write down:  What you eat and drink.  How much sleep you get.  Any change to your diet or medicines.  Try massage or other relaxation techniques.  Limit stress.  Sit up straight, and do not tense your muscles.  Do not use tobacco products, including cigarettes, chewing tobacco, or e-cigarettes. If you need help quitting, ask your health care provider.  Exercise regularly as told by your health care provider.  Sleep on a regular schedule. Get 7-9 hours  of sleep, or the amount recommended by your health care provider. SEEK MEDICAL CARE IF:   Your symptoms are not helped by medicine.  You have a headache that is different from the usual headache.  You have nausea or you vomit.  You have a fever. SEEK IMMEDIATE MEDICAL CARE IF:   Your headache becomes severe.  You have repeated vomiting.  You have a stiff neck.  You have a loss of vision.  You have problems with speech.  You have pain in the eye or ear.  You have muscular weakness or loss of muscle control.  You lose your balance or have trouble walking.  You feel faint or pass out.  You have confusion.   This information is not intended to replace advice given to you by your health care provider. Make sure you discuss any questions you have with your  health care provider.   Document Released: 01/11/2005 Document Revised: 10/02/2014 Document Reviewed: 05/06/2014 Elsevier Interactive Patient Education 2016 [employer]  Nonspecific Chest Pain  Chest pain can be caused by many different conditions. There is always a chance that your pain could be related to something serious, such as a heart attack or a blood clot in your lungs. Chest pain can also be caused by conditions that are not life-threatening. If you have chest pain, it is very important to follow up with your health care provider. CAUSES  Chest pain can be caused by:  Heartburn.  Pneumonia or bronchitis.  Anxiety or stress.  Inflammation around your heart (pericarditis) or lung (pleuritis or pleurisy).  A blood clot in your lung.  A collapsed lung (pneumothorax). It can develop suddenly on its own (spontaneous pneumothorax) or from trauma to the chest.  Shingles infection (varicella-zoster virus).  Heart attack.  Damage to the bones, muscles, and cartilage that make up your chest wall. This can include:  Bruised bones due to injury.  Strained muscles or cartilage due to frequent or repeated coughing or overwork.  Fracture to one or more ribs.  Sore cartilage due to inflammation (costochondritis). RISK FACTORS  Risk factors for chest pain may include:  Activities that increase your risk for trauma or injury to your chest.  Respiratory infections or conditions that cause frequent coughing.  Medical conditions or overeating that can cause heartburn.  Heart disease or family history of heart disease.  Conditions or health behaviors that increase your risk of developing a blood clot.  Having had chicken pox (varicella zoster). SIGNS AND SYMPTOMS Chest pain can feel like:  Burning or tingling on the surface of your chest or deep in your chest.  Crushing, pressure, aching, or squeezing pain.  Dull or sharp pain that is worse when you move, cough, or take  a deep breath.  Pain that is also felt in your back, neck, shoulder, or arm, or pain that spreads to any of these areas. Your chest pain may come and go, or it may stay constant. DIAGNOSIS Lab tests or other studies may be needed to find the cause of your pain. Your health care provider may have you take a test called an ambulatory ECG (electrocardiogram). An ECG records your heartbeat patterns at the time the test is performed. You may also have other tests, such as:  Transthoracic echocardiogram (TTE). During echocardiography, sound waves are used to create a picture of all of the heart structures and to look at how blood flows through your heart.  Transesophageal echocardiogram (TEE).This is a more advanced imaging test  that obtains images from inside your body. It allows your health care provider to see your heart in finer detail.  Cardiac monitoring. This allows your health care provider to monitor your heart rate and rhythm in real time.  Holter monitor. This is a portable device that records your heartbeat and can help to diagnose abnormal heartbeats. It allows your health care provider to track your heart activity for several days, if needed.  Stress tests. These can be done through exercise or by taking medicine that makes your heart beat more quickly.  Blood tests.  Imaging tests. TREATMENT  Your treatment depends on what is causing your chest pain. Treatment may include:  Medicines. These may include:  Acid blockers for heartburn.  Anti-inflammatory medicine.  Pain medicine for inflammatory conditions.  Antibiotic medicine, if an infection is present.  Medicines to dissolve blood clots.  Medicines to treat coronary artery disease.  Supportive care for conditions that do not require medicines. This may include:  Resting.  Applying heat or cold packs to injured areas.  Limiting activities until pain decreases. HOME CARE INSTRUCTIONS  If you were prescribed an  antibiotic medicine, finish it all even if you start to feel better.  Avoid any activities that bring on chest pain.  Do not use any tobacco products, including cigarettes, chewing tobacco, or electronic cigarettes. If you need help quitting, ask your health care provider.  Do not drink alcohol.  Take medicines only as directed by your health care provider.  Keep all follow-up visits as directed by your health care provider. This is important. This includes any further testing if your chest pain does not go away.  If heartburn is the cause for your chest pain, you may be told to keep your head raised (elevated) while sleeping. This reduces the chance that acid will go from your stomach into your esophagus.  Make lifestyle changes as directed by your health care provider. These may include:  Getting regular exercise. Ask your health care provider to suggest some activities that are safe for you.  Eating a heart-healthy diet. A registered dietitian can help you to learn healthy eating options.  Maintaining a healthy weight.  Managing diabetes, if necessary.  Reducing stress. SEEK MEDICAL CARE IF:  Your chest pain does not go away after treatment.  You have a rash with blisters on your chest.  You have a fever. SEEK IMMEDIATE MEDICAL CARE IF:   Your chest pain is worse.  You have an increasing cough, or you cough up blood.  You have severe abdominal pain.  You have severe weakness.  You faint.  You have chills.  You have sudden, unexplained chest discomfort.  You have sudden, unexplained discomfort in your arms, back, neck, or jaw.  You have shortness of breath at any time.  You suddenly start to sweat, or your skin gets clammy.  You feel nauseous or you vomit.  You suddenly feel light-headed or dizzy.  Your heart begins to beat quickly, or it feels like it is skipping beats. These symptoms may represent a serious problem that is an emergency. Do not wait to  see if the symptoms will go away. Get medical help right away. Call your local emergency services (911 in the [place] Do not drive yourself to the hospital.   This information is not intended to replace advice given to you by your health care provider. Make sure you discuss any questions you have with your health care provider.   Document Released: 10/21/2004  Document Revised: 02/01/2014 Document Reviewed: 08/17/2013 Elsevier Interactive Patient Education [employer].

## 2015-02-16 NOTE — ED Provider Notes (Addendum)
[facility] Emergency Department Provider Note  ____________________________________________  Time seen: Approximately 3:16 AM  I have reviewed the triage vital signs and the nursing notes.   HISTORY  Chief Complaint Headache    HPI [name] is a 50 y.o. female who presents to the ED from home via EMS for a chief complaint of elevated blood pressure. Patient states she has chronically elevated pressures which are hard to control. Had a checkup with her PCP last week and states her blood pressure was "high". No medication adjustments at that time. States she has symptoms of headache, nausea, face pain and chest burning when her pressure is elevated. She has had these symptoms intermittently for the past 3 days. States she is compliant with her daily medications. Denies recent travel or trauma. Denies fever, chills, shortness of breath, abdominal pain, vomiting, diarrhea. Patient has had cold type symptoms with nonproductive cough. Denies use of decongestants. Patient is on continuous oxygen at home.   Past Medical History  Diagnosis Date  . CHF (congestive heart failure) ([place])   . A-fib ([place]) 2010  . Stroke ([place])   . Hypertension   . COPD (chronic obstructive pulmonary disease) ([place])   . Gout     There are no active problems to display for this patient.   Past Surgical History  Procedure Laterality Date  . Lt ankle fracture  2002  . Bilateral wrist fractures  2010    No current outpatient prescriptions on file.  Allergies Morphine and related and Penicillins  No family history on file.  Social History Social History  Substance Use Topics  . Smoking status: Smoker, Current Status Unknown  . Smokeless tobacco: None  . Alcohol Use: Yes    Review of Systems Constitutional: No fever/chills Eyes: No visual changes. ENT: No sore throat. Cardiovascular: Positive for burning sensation in chest. Respiratory: Positive for nonproductive cough.  Denies shortness of breath. Gastrointestinal: No abdominal pain.  Positive for nausea, no vomiting.  No diarrhea.  No constipation. Genitourinary: Negative for dysuria. Musculoskeletal: Negative for back pain. Skin: Negative for rash. Neurological: Positive for headache. Negative for focal weakness or numbness.  10-point ROS otherwise negative.  ____________________________________________   PHYSICAL EXAM:  VITAL SIGNS: ED Triage Vitals  Enc Vitals Group     BP 02/16/15 0146 168/121 mmHg     Pulse Rate 02/16/15 0146 72     Resp 02/16/15 0146 18     Temp 02/16/15 0146 97.9 F (36.6 C)     Temp Source 02/16/15 0146 Oral     SpO2 02/16/15 0146 94 %     Weight 02/16/15 0146 224 lb (101.606 kg)     Height 02/16/15 0146 5\' 2"  (1.575 m)     Head Cir --      Peak Flow --      Pain Score 02/16/15 0148 10     Pain Loc --      Pain Edu? --      Excl. in [place]? --     Constitutional: Alert and oriented. Well appearing and in no acute distress. Eyes: Conjunctivae are normal. PERRL. EOMI. Head: Atraumatic. Nose: Congestion/rhinnorhea. Mouth/Throat: Mucous membranes are moist.  Oropharynx non-erythematous.  Mildly hoarse voice secondary to cold type symptoms. Neck: No stridor.  No carotid bruits. Cardiovascular: Normal rate, regular rhythm. Grossly normal heart sounds.  Good peripheral circulation. Respiratory: Normal respiratory effort.  No retractions. Lungs CTAB. Gastrointestinal: Obese. Soft and nontender. No distention. No abdominal bruits. No CVA tenderness. Musculoskeletal:  No lower extremity tenderness nor edema.  No joint effusions. Neurologic:  Normal speech and language. No gross focal neurologic deficits are appreciated.  Skin:  Skin is warm, dry and intact. No rash noted. Psychiatric: Mood and affect are normal. Speech and behavior are normal.  ____________________________________________   LABS (all labs ordered are listed, but only abnormal results are  displayed)  Labs Reviewed  BASIC METABOLIC PANEL - Abnormal; Notable for the following:    Potassium 3.1 (*)    Glucose, Bld 101 (*)    Calcium 8.7 (*)    All other components within normal limits  CBC - Abnormal; Notable for the following:    RDW 15.0 (*)    All other components within normal limits  BRAIN NATRIURETIC PEPTIDE  TROPONIN I  TROPONIN I   ____________________________________________  EKG  ED ECG REPORT I, Thyra Yinger J, the attending physician, personally viewed and interpreted this ECG.   Date: 02/16/2015  EKG Time: 0143  Rate: 67  Rhythm: normal EKG, normal sinus rhythm  Axis: Within normal limits  Intervals:none  ST&T Change: Nonspecific  ____________________________________________  RADIOLOGY  CT head without contrast interpreted per Dr. Quintella Reichert: No acute intracranial hemorrhage.  Mild stable chronic microvascular ischemic disease.  If symptoms persist and there are no contraindications, MRI may provide better evaluation if clinically indicated   Chest 2 view (viewed by me, interpreted per Dr. Radene Knee): Mild vascular congestion and borderline cardiomegaly. Chronic bilateral pleural thickening noted. Lungs remain grossly clear. ____________________________________________   PROCEDURES  Procedure(s) performed: None  Critical Care performed: No  ____________________________________________   INITIAL IMPRESSION / ASSESSMENT AND PLAN / ED COURSE  Pertinent labs & imaging results that were available during my care of the patient were reviewed by me and considered in my medical decision making (see chart for details).  50 year old female with a history of difficult to control hypertension with elevated blood pressure associated with chest burning and headache. Will obtain screening lab work including troponin, CT head to evaluate intracranial hemorrhage; administer IV hydralazine for blood  pressure.  ----------------------------------------- 5:25 AM on 02/16/2015 -----------------------------------------  Pressure slightly improved. Patient continues to complain of headache and chest burning. Will administer additional dose of IV hydralazine, Toradol for headache and Protonix for burning sensation in chest. Will repeat troponin.  ----------------------------------------- 7:10 AM on 02/16/2015 -----------------------------------------  Chest burning improved after Protonix. He troponin negative. Patient continues to have headache and blood pressure is elevated again. Discussed with hospitalist Dr. Benjie Karvonen for admission; recommends administering patient's morning medications and reassess. Call back as needed. Patient is unclear of her home medications, but I found a record from October 2016 of her home medicines. She states all of her medicines and dosages have remained the same. Care transferred to Dr. Corky Downs for reassessment of blood pressure. Disposition pending blood pressure reassessment.  MISCELLANEOUS MEDICAL SUPPLY MISC Frequency:PHARMDIR Dosage:0.0 Instructions: Note:CPAP 13 cm H20 with heated humidity. Mask: ResMed [place] Quattro, small Dose: 13 CM H20   01/05/2012  Active  melatonin 10 mg TbER Take 10 mg by mouth nightly as needed.     Active  potassium chloride SA (K-DUR,KLOR-CON) 20 MEQ tablet Take 1 tablet (20 mEq total) by mouth daily. 30 tablet  5 09/11/2013  Active  amLODIPine (NORVASC) 10 MG tablet Take 1 tablet (10 mg total) by mouth daily. 30 tablet  5 11/25/2014  Active  aspirin (ASPIR-81) 81 MG tablet Take 1 tablet (81 mg total) by mouth daily. 30 tablet  11 11/25/2014  Active  atorvastatin (  LIPITOR) 20 MG tablet Take 1 tablet (20 mg total) by mouth daily. 30 tablet  5 11/25/2014  Active  cloNIDine HCl (CATAPRES) 0.1 MG tablet Take 1 tablet (0.1 mg total) by mouth Two (2) times a day. 60 tablet  5 11/25/2014  Active  fluticasone (FLONASE) 50  mcg/actuation nasal spray 1 spray by Each Nare route Two (2) times a day. 16 g  5 11/25/2014 11/25/2015 Active  hydrALAZINE (APRESOLINE) 50 MG tablet Take 1 tablet (50 mg total) by mouth Three (3) times a day. 90 tablet  5 11/25/2014  Active  lisinopril (PRINIVIL,ZESTRIL) 40 MG tablet Take 1 tablet (40 mg total) by mouth daily. 30 tablet  5 11/25/2014  Active  metoprolol succinate (TOPROL-XL) 100 MG 24 hr tablet Take 1 tablet (100 mg total) by mouth daily. 30 tablet  5 11/25/2014  Active  albuterol (PROVENTIL HFA) 90 mcg/actuation inhaler Inhale 2 puffs every four (4) hours as needed for wheezing. 1 Inhaler  2 11/25/2014  Active  sertraline (ZOLOFT) 100 MG tablet Take 1 tablet (100 mg total) by mouth daily. 30 tablet  5 11/25/2014  Active  spironolactone (ALDACTONE) 25 MG tablet Take 1 tablet (25 mg total) by mouth daily. 30 tablet  5 11/25/2014  Active  tiotropium (SPIRIVA WITH HANDIHALER) 18 mcg inhalation capsule Place 1 capsule (18 mcg total) into inhaler and inhale once daily. 1 each  5 11/25/2014  Active  miscellaneous medical supply Misc Compression stockings, waist high or upper thighs, 20-83mm Hg. Dx: chronic LE edema and CHF         ____________________________________________   FINAL CLINICAL IMPRESSION(S) / ED DIAGNOSES  Final diagnoses:  Secondary hypertension, unspecified  Acute nonintractable headache, unspecified headache type  Chest pain, unspecified chest pain type      Paulette Blanch, MD 02/16/15 [place], MD 02/16/15 [phone]

## 2015-02-16 NOTE — ED Notes (Signed)
Pt. Here via EMS for HA and nausea for the past 3 days.  Pt. Has hx of TIA's.  Pt. States lt. Sided pain and chest burning that started 3 days ago.  Pt. Has equal grip strength and no deficients.

## 2015-02-16 NOTE — ED Notes (Signed)
Pt. States she has had HA, Nausea, bilateral ear pain, lt. Sided pain and chest burning sensation for the past 3 days.  Pt. States hx TIA's.

## 2015-03-31 DIAGNOSIS — Z880 Allergy status to penicillin: Secondary | ICD-10-CM | POA: Diagnosis not present

## 2015-03-31 DIAGNOSIS — R112 Nausea with vomiting, unspecified: Secondary | ICD-10-CM | POA: Diagnosis not present

## 2015-03-31 DIAGNOSIS — R42 Dizziness and giddiness: Secondary | ICD-10-CM | POA: Insufficient documentation

## 2015-03-31 DIAGNOSIS — R197 Diarrhea, unspecified: Secondary | ICD-10-CM | POA: Diagnosis not present

## 2015-03-31 DIAGNOSIS — J441 Chronic obstructive pulmonary disease with (acute) exacerbation: Secondary | ICD-10-CM | POA: Insufficient documentation

## 2015-03-31 DIAGNOSIS — I10 Essential (primary) hypertension: Secondary | ICD-10-CM | POA: Insufficient documentation

## 2015-03-31 DIAGNOSIS — Z79899 Other long term (current) drug therapy: Secondary | ICD-10-CM | POA: Diagnosis not present

## 2015-03-31 DIAGNOSIS — F17200 Nicotine dependence, unspecified, uncomplicated: Secondary | ICD-10-CM | POA: Diagnosis not present

## 2015-03-31 DIAGNOSIS — R079 Chest pain, unspecified: Secondary | ICD-10-CM | POA: Diagnosis not present

## 2015-03-31 DIAGNOSIS — I509 Heart failure, unspecified: Secondary | ICD-10-CM | POA: Insufficient documentation

## 2015-03-31 DIAGNOSIS — R1010 Upper abdominal pain, unspecified: Secondary | ICD-10-CM | POA: Insufficient documentation

## 2015-03-31 DIAGNOSIS — R51 Headache: Secondary | ICD-10-CM | POA: Insufficient documentation

## 2015-04-01 ENCOUNTER — Emergency Department: Payer: Medicaid Other

## 2015-04-01 ENCOUNTER — Encounter: Payer: Self-pay | Admitting: Emergency Medicine

## 2015-04-01 ENCOUNTER — Emergency Department
Admission: EM | Admit: 2015-04-01 | Discharge: 2015-04-01 | Disposition: A | Discharge status: Home / Self Care | Payer: Medicaid Other | Attending: Emergency Medicine | Admitting: Emergency Medicine

## 2015-04-01 DIAGNOSIS — R51 Headache: Secondary | ICD-10-CM

## 2015-04-01 DIAGNOSIS — R079 Chest pain, unspecified: Secondary | ICD-10-CM

## 2015-04-01 DIAGNOSIS — R109 Unspecified abdominal pain: Secondary | ICD-10-CM

## 2015-04-01 DIAGNOSIS — R1110 Vomiting, unspecified: Secondary | ICD-10-CM

## 2015-04-01 DIAGNOSIS — R197 Diarrhea, unspecified: Secondary | ICD-10-CM

## 2015-04-01 DIAGNOSIS — R519 Headache, unspecified: Secondary | ICD-10-CM

## 2015-04-01 LAB — COMPREHENSIVE METABOLIC PANEL
ALK PHOS: 79 U/L (ref 38–126)
ALT: 13 U/L — AB (ref 14–54)
AST: 18 U/L (ref 15–41)
Albumin: 3.8 g/dL (ref 3.5–5.0)
Anion gap: 9 (ref 5–15)
BILIRUBIN TOTAL: 0.9 mg/dL (ref 0.3–1.2)
BUN: 13 mg/dL (ref 6–20)
CALCIUM: 8.9 mg/dL (ref 8.9–10.3)
CHLORIDE: 108 mmol/L (ref 101–111)
CO2: 25 mmol/L (ref 22–32)
CREATININE: 0.81 mg/dL (ref 0.44–1.00)
GFR calc Af Amer: >60 mL/min (ref >60)
Glucose, Bld: 112 mg/dL — ABNORMAL HIGH (ref 65–99)
Potassium: 3.1 mmol/L — ABNORMAL LOW (ref 3.5–5.1)
Sodium: 142 mmol/L (ref 135–145)
Total Protein: 7.4 g/dL (ref 6.5–8.1)

## 2015-04-01 LAB — CBC WITH DIFFERENTIAL/PLATELET
Basophils Absolute: 0.1 10*3/uL (ref 0–0.1)
Basophils Relative: 1 %
Eosinophils Absolute: 0.1 10*3/uL (ref 0–0.7)
Eosinophils Relative: 1 %
HEMATOCRIT: 39.4 % (ref 35.0–47.0)
HEMOGLOBIN: 13 g/dL (ref 12.0–16.0)
LYMPHS ABS: 1.8 10*3/uL (ref 1.0–3.6)
LYMPHS PCT: 15 %
MCH: 27.9 pg (ref 26.0–34.0)
MCHC: 33 g/dL (ref 32.0–36.0)
MCV: 84.6 fL (ref 80.0–100.0)
MONOS PCT: 8 %
Monocytes Absolute: 0.9 10*3/uL (ref 0.2–0.9)
NEUTROS ABS: 8.7 10*3/uL — AB (ref 1.4–6.5)
NEUTROS PCT: 75 %
Platelets: 292 10*3/uL (ref 150–440)
RBC: 4.66 MIL/uL (ref 3.80–5.20)
RDW: 14.8 % — ABNORMAL HIGH (ref 11.5–14.5)
WBC: 11.5 10*3/uL — ABNORMAL HIGH (ref 3.6–11.0)

## 2015-04-01 LAB — LIPASE, BLOOD: LIPASE: 16 U/L (ref 11–51)

## 2015-04-01 LAB — TROPONIN I
Troponin I: <0.03 ng/mL (ref <0.031)
Troponin I: <0.03 ng/mL (ref <0.031)

## 2015-04-01 MED ORDER — KETOROLAC TROMETHAMINE 60 MG/2ML IM SOLN
INTRAMUSCULAR | Status: AC
  Administered 2015-04-01: 30 mg
  Filled 2015-04-01: qty 2

## 2015-04-01 MED ORDER — METOCLOPRAMIDE HCL 5 MG/ML IJ SOLN
10.0000 mg | Freq: Once | INTRAMUSCULAR | Status: AC
  Administered 2015-04-01: 10 mg via INTRAVENOUS
  Filled 2015-04-01: qty 2

## 2015-04-01 MED ORDER — DIPHENHYDRAMINE HCL 50 MG/ML IJ SOLN
25.0000 mg | Freq: Once | INTRAMUSCULAR | Status: AC
  Administered 2015-04-01: 25 mg via INTRAVENOUS
  Filled 2015-04-01: qty 1

## 2015-04-01 MED ORDER — METOCLOPRAMIDE HCL 10 MG PO TABS: 10.0000 mg | ORAL_TABLET | Freq: Three times a day (TID) | ORAL | Status: DC | PRN

## 2015-04-01 MED ORDER — KETOROLAC TROMETHAMINE 30 MG/ML IJ SOLN: 30.0000 mg | Freq: Once | INTRAMUSCULAR | Status: DC

## 2015-04-01 MED ORDER — SODIUM CHLORIDE 0.9 % IV BOLUS (SEPSIS)
1000.0000 mL | Freq: Once | INTRAVENOUS | Status: AC
  Administered 2015-04-01: 1000 mL via INTRAVENOUS

## 2015-04-01 NOTE — Discharge Instructions (Signed)
Diarrhea Diarrhea is frequent loose and watery bowel movements. It can cause you to feel weak and dehydrated. Dehydration can cause you to become tired and thirsty, have a dry mouth, and have decreased urination that often is dark yellow. Diarrhea is a sign of another problem, most often an infection that will not last long. In most cases, diarrhea typically lasts 2-3 days. However, it can last longer if it is a sign of something more serious. It is important to treat your diarrhea as directed by your caregiver to lessen or prevent future episodes of diarrhea. CAUSES  Some common causes include:  Gastrointestinal infections caused by viruses, bacteria, or parasites.  Food poisoning or food allergies.  Certain medicines, such as antibiotics, chemotherapy, and laxatives.  Artificial sweeteners and fructose.  Digestive disorders. HOME CARE INSTRUCTIONS  Ensure adequate fluid intake (hydration): Have 1 cup (8 oz) of fluid for each diarrhea episode. Avoid fluids that contain simple sugars or sports drinks, fruit juices, whole milk products, and sodas. Your urine should be clear or pale yellow if you are drinking enough fluids. Hydrate with an oral rehydration solution that you can purchase at pharmacies, retail stores, and online. You can prepare an oral rehydration solution at home by mixing the following ingredients together:   - tsp table salt.   tsp baking soda.   tsp salt substitute containing potassium chloride.  1  tablespoons sugar.  1 L (34 oz) of water.  Certain foods and beverages may increase the speed at which food moves through the gastrointestinal (GI) tract. These foods and beverages should be avoided and include:  Caffeinated and alcoholic beverages.  High-fiber foods, such as raw fruits and vegetables, nuts, seeds, and whole grain breads and cereals.  Foods and beverages sweetened with sugar alcohols, such as xylitol, sorbitol, and mannitol.  Some foods may be well  tolerated and may help thicken stool including:  Starchy foods, such as rice, toast, pasta, low-sugar cereal, oatmeal, grits, baked potatoes, crackers, and bagels.  Bananas.  Applesauce.  Add probiotic-rich foods to help increase healthy bacteria in the GI tract, such as yogurt and fermented milk products.  Wash your hands well after each diarrhea episode.  Only take over-the-counter or prescription medicines as directed by your caregiver.  Take a warm bath to relieve any burning or pain from frequent diarrhea episodes. SEEK IMMEDIATE MEDICAL CARE IF:   You are unable to keep fluids down.  You have persistent vomiting.  You have blood in your stool, or your stools are black and tarry.  You do not urinate in 6-8 hours, or there is only a small amount of very dark urine.  You have abdominal pain that increases or localizes.  You have weakness, dizziness, confusion, or light-headedness.  You have a severe headache.  Your diarrhea gets worse or does not get better.  You have a fever or persistent symptoms for more than 2-3 days.  You have a fever and your symptoms suddenly get worse. MAKE SURE YOU:   Understand these instructions.  Will watch your condition.  Will get help right away if you are not doing well or get worse.   This information is not intended to replace advice given to you by your health care provider. Make sure you discuss any questions you have with your health care provider.   Document Released: 01/01/2002 Document Revised: 02/01/2014 Document Reviewed: 09/19/2011 Elsevier Interactive Patient Education 2016 [place] Headache Without Cause A headache is pain or discomfort felt  around the head or neck area. There are many causes and types of headaches. In some cases, the cause may not be found.  HOME CARE  Managing Pain  Take over-the-counter and prescription medicines only as told by your doctor.  Lie down in a dark, quiet room when  you have a headache.  If directed, apply ice to the head and neck area:  Put ice in a plastic bag.  Place a towel between your skin and the bag.  Leave the ice on for 20 minutes, 2-3 times per day.  Use a heating pad or hot shower to apply heat to the head and neck area as told by your doctor.  Keep lights dim if bright lights bother you or make your headaches worse. Eating and Drinking  Eat meals on a regular schedule.  Lessen how much alcohol you drink.  Lessen how much caffeine you drink, or stop drinking caffeine. General Instructions  Keep all follow-up visits as told by your doctor. This is important.  Keep a journal to find out if certain things bring on headaches. For example, write down:  What you eat and drink.  How much sleep you get.  Any change to your diet or medicines.  Relax by getting a massage or doing other relaxing activities.  Lessen stress.  Sit up straight. Do not tighten (tense) your muscles.  Do not use tobacco products. This includes cigarettes, chewing tobacco, or e-cigarettes. If you need help quitting, ask your doctor.  Exercise regularly as told by your doctor.  Get enough sleep. This often means 7-9 hours of sleep. GET HELP IF:  Your symptoms are not helped by medicine.  You have a headache that feels different than the other headaches.  You feel sick to your stomach (nauseous) or you throw up (vomit).  You have a fever. GET HELP RIGHT AWAY IF:   Your headache becomes really bad.  You keep throwing up.  You have a stiff neck.  You have trouble seeing.  You have trouble speaking.  You have pain in the eye or ear.  Your muscles are weak or you lose muscle control.  You lose your balance or have trouble walking.  You feel like you will pass out (faint) or you pass out.  You have confusion.   This information is not intended to replace advice given to you by your health care provider. Make sure you discuss any  questions you have with your health care provider.   Document Released: 10/21/2007 Document Revised: 10/02/2014 Document Reviewed: 05/06/2014 Elsevier Interactive Patient Education 2016 [employer] Inc[employer]  Nausea and Vomiting Nausea is a sick feeling that often comes before throwing up (vomiting). Vomiting is a reflex where stomach contents come out of your mouth. Vomiting can cause severe loss of body fluids (dehydration). Children and elderly adults can become dehydrated quickly, especially if they also have diarrhea. Nausea and vomiting are symptoms of a condition or disease. It is important to find the cause of your symptoms. CAUSES   Direct irritation of the stomach lining. This irritation can result from increased acid production (gastroesophageal reflux disease), infection, food poisoning, taking certain medicines (such as nonsteroidal anti-inflammatory drugs), alcohol use, or tobacco use.  Signals from the brain.These signals could be caused by a headache, heat exposure, an inner ear disturbance, increased pressure in the brain from injury, infection, a tumor, or a concussion, pain, emotional stimulus, or metabolic problems.  An obstruction in the gastrointestinal tract (bowel obstruction).  Illnesses such  as diabetes, hepatitis, gallbladder problems, appendicitis, kidney problems, cancer, sepsis, atypical symptoms of a heart attack, or eating disorders.  Medical treatments such as chemotherapy and radiation.  Receiving medicine that makes you sleep (general anesthetic) during surgery. DIAGNOSIS Your caregiver may ask for tests to be done if the problems do not improve after a few days. Tests may also be done if symptoms are severe or if the reason for the nausea and vomiting is not clear. Tests may include:  Urine tests.  Blood tests.  Stool tests.  Cultures (to look for evidence of infection).  X-rays or other imaging studies. Test results can help your caregiver make decisions  about treatment or the need for additional tests. TREATMENT You need to stay well hydrated. Drink frequently but in small amounts.You may wish to drink water, sports drinks, clear broth, or eat frozen ice pops or gelatin dessert to help stay hydrated.When you eat, eating slowly may help prevent nausea.There are also some antinausea medicines that may help prevent nausea. HOME CARE INSTRUCTIONS   Take all medicine as directed by your caregiver.  If you do not have an appetite, do not force yourself to eat. However, you must continue to drink fluids.  If you have an appetite, eat a normal diet unless your caregiver tells you differently.  Eat a variety of complex carbohydrates (rice, wheat, potatoes, bread), lean meats, yogurt, fruits, and vegetables.  Avoid high-fat foods because they are more difficult to digest.  Drink enough water and fluids to keep your urine clear or pale yellow.  If you are dehydrated, ask your caregiver for specific rehydration instructions. Signs of dehydration may include:  Severe thirst.  Dry lips and mouth.  Dizziness.  Dark urine.  Decreasing urine frequency and amount.  Confusion.  Rapid breathing or pulse. SEEK IMMEDIATE MEDICAL CARE IF:   You have blood or brown flecks (like coffee grounds) in your vomit.  You have black or bloody stools.  You have a severe headache or stiff neck.  You are confused.  You have severe abdominal pain.  You have chest pain or trouble breathing.  You do not urinate at least once every 8 hours.  You develop cold or clammy skin.  You continue to vomit for longer than 24 to 48 hours.  You have a fever. MAKE SURE YOU:   Understand these instructions.  Will watch your condition.  Will get help right away if you are not doing well or get worse.   This information is not intended to replace advice given to you by your health care provider. Make sure you discuss any questions you have with your health  care provider.   Document Released: 01/11/2005 Document Revised: 04/05/2011 Document Reviewed: 06/10/2010 Elsevier Interactive Patient Education [employer].

## 2015-04-01 NOTE — ED Notes (Signed)
Pt presents to ED with sob, frequent dry cough, headache, diarrhea, and chest burning for the past several days. Pt denies fever at home. Pt states she has been taking her daily medications as prescribed. Pt currently has no increased work of breathing noted at this time. Pt states her chest was burning so badly earlier that she used her oxygen at home on 2L with helped with the sob and chest burning slightly. c-pap used tonight and nebulizer used at home earlier today with no relief.

## 2015-04-01 NOTE — ED Notes (Signed)
Pt ambulatory at to bedside commode

## 2015-04-01 NOTE — ED Notes (Signed)
Received report from sylvia rn care assumed.

## 2015-04-01 NOTE — ED Notes (Signed)
Pt refuses to take BP, reports cuff too tight unable to recheck VS, pt reports her BP usually systolic is in the [id number] range

## 2015-04-01 NOTE — ED Provider Notes (Signed)
[facility] Emergency Department Provider Note  ____________________________________________  Time seen: Approximately 390 AM  I have reviewed the triage vital signs and the nursing notes.   HISTORY  Chief Complaint Cough; Headache; Diarrhea; and Chest Pain    HPI [name] is a 50 y.o. female comes into the hospital today with vomiting diarrhea chest pain headache and hypertension. The patient reports that the symptoms started 2 days ago but is worse today. She reports that she feels sick. She reports she's vomited at least 3 times a day and she's had diarrhea the same amount. She reports that her emesis looks white and her diarrhea is watery. She has been unable to eat. The patient denies any sick contacts. She went to a funeral today but is unsure if she met anyone there who may have been sick. The patient reports the chest pain started a couple days ago but seemed to be worse today. She reports she's had chest pain in the past and has a history of CHF. She also has had headaches in the past whenever her blood pressure was high. She reports that she takes a lot of blood pressure medicine and her blood pressure is normally in the 190s over 116. She's been short of breath with some sweats and dizziness. She's had some upper abdominal pain that she rates a 9 out of 10 in intensity. The patient reports that she's sick and she is here to get checked out.   Past Medical History  Diagnosis Date  . CHF (congestive heart failure) ([place])   . A-fib ([place]) 2010  . Stroke ([place])   . Hypertension   . COPD (chronic obstructive pulmonary disease) ([place])   . Gout     There are no active problems to display for this patient.   Past Surgical History  Procedure Laterality Date  . Lt ankle fracture  2002  . Bilateral wrist fractures  2010    Current Outpatient Rx  Name  Route  Sig  Dispense  Refill  . amLODipine (NORVASC) 10 MG tablet   Oral   Take 10 mg by mouth  daily.         Marland Kitchen atorvastatin (LIPITOR) 20 MG tablet   Oral   Take 20 mg by mouth daily.         . cloNIDine (CATAPRES) 0.1 MG tablet   Oral   Take 0.1 mg by mouth 2 (two) times daily.         . hydrALAZINE (APRESOLINE) 50 MG tablet   Oral   Take 50 mg by mouth 3 (three) times daily.         Marland Kitchen lisinopril (PRINIVIL,ZESTRIL) 40 MG tablet   Oral   Take 40 mg by mouth daily.         . metoprolol succinate (TOPROL-XL) 100 MG 24 hr tablet   Oral   Take 100 mg by mouth daily. Take with or immediately following a meal.         . sertraline (ZOLOFT) 100 MG tablet   Oral   Take 100 mg by mouth daily.         Marland Kitchen spironolactone (ALDACTONE) 25 MG tablet   Oral   Take 25 mg by mouth daily.         . metoCLOPramide (REGLAN) 10 MG tablet   Oral   Take 1 tablet (10 mg total) by mouth every 8 (eight) hours as needed for nausea.   20 tablet   0  Allergies Morphine and related and Penicillins  No family history on file.  Social History Social History  Substance Use Topics  . Smoking status: Current Some Day Smoker  . Smokeless tobacco: Never Used  . Alcohol Use: Yes    Review of Systems Constitutional: Sweats Eyes: No visual changes. ENT: No sore throat. Cardiovascular:  chest pain. Respiratory: shortness of breath. Gastrointestinal: Abdominal pain, nausea, vomiting  Genitourinary: Negative for dysuria. Musculoskeletal: Negative for back pain. Skin: Negative for rash. Neurological: Headache and dizziness  10-point ROS otherwise negative.  ____________________________________________   PHYSICAL EXAM:  VITAL SIGNS: ED Triage Vitals  Enc Vitals Group     BP 04/01/15 0008 172/106 mmHg     Pulse Rate 04/01/15 0008 101     Resp 04/01/15 0008 24     Temp 04/01/15 0008 97.9 F (36.6 C)     Temp Source 04/01/15 0008 Oral     SpO2 04/01/15 0008 96 %     Weight 04/01/15 0008 224 lb (101.606 kg)     Height 04/01/15 0008 '5\' 10"'$  (1.778 m)     Head Cir  --      Peak Flow --      Pain Score 04/01/15 0009 8     Pain Loc --      Pain Edu? --      Excl. in [place]? --     Constitutional: Alert and oriented. Well appearing and in moderate distress. Eyes: Conjunctivae are normal. PERRL. EOMI. Head: Atraumatic. Nose: No congestion/rhinnorhea. Mouth/Throat: Mucous membranes are moist.  Oropharynx non-erythematous. Cardiovascular: Normal rate, regular rhythm. Grossly normal heart sounds.  Good peripheral circulation. Respiratory: Normal respiratory effort.  No retractions. Lungs CTAB. Gastrointestinal: Soft with upper abd pain to palpation. No distention. Positive bowel sounds Musculoskeletal: No lower extremity tenderness nor edema.   Neurologic:  Normal speech and language. Cranial nerves II through XII are grossly intact with no focal motor or neuro deficits Skin:  Skin is warm, dry and intact.  Psychiatric: Mood and affect are normal.   ____________________________________________   LABS (all labs ordered are listed, but only abnormal results are displayed)  Labs Reviewed  CBC WITH DIFFERENTIAL/PLATELET - Abnormal; Notable for the following:    WBC 11.5 (*)    RDW 14.8 (*)    Neutro Abs 8.7 (*)    All other components within normal limits  COMPREHENSIVE METABOLIC PANEL - Abnormal; Notable for the following:    Potassium 3.1 (*)    Glucose, Bld 112 (*)    ALT 13 (*)    All other components within normal limits  TROPONIN I  LIPASE, BLOOD  TROPONIN I   ____________________________________________  EKG  ED ECG REPORT I, Loney Hering, the attending physician, personally viewed and interpreted this ECG.   Date: 04/01/2015  EKG Time: 0020  Rate: 100  Rhythm: normal sinus rhythm  Axis: normal  Intervals:none  ST&T Change: none  ____________________________________________  RADIOLOGY  CXR: No acute cardiopulmonary disease  RUQ [place]: Fatty infiltration on the liver, gallbladder and visualized ducts appear  normal ____________________________________________   PROCEDURES  Procedure(s) performed: None  Critical Care performed: No  ____________________________________________   INITIAL IMPRESSION / ASSESSMENT AND PLAN / ED COURSE  Pertinent labs & imaging results that were available during my care of the patient were reviewed by me and considered in my medical decision making (see chart for details).  This is a 50 year old female who comes in with vomiting diarrhea chest pain and some headache. I  did write for the patient to receive a liter of normal saline as well as Reglan, Toradol and Benadryl. The patient's ultrasound is unremarkable so I'll also give her a GI cocktail and then do a by mouth trial. The patient's blood pressure is elevated but no more elevated than typical. Patient will receive a repeat troponin and she will be dispositioned when she's had a by mouth trial.  The patient reports that she does feel some improvement at this time. She was sleeping comfortably. I will discharge the patient to home and have her follow-up with her primary care physician. The patient has no further complaints or concerns at this time and her blood pressures improved. ____________________________________________   FINAL CLINICAL IMPRESSION(S) / ED DIAGNOSES  Final diagnoses:  Abdominal pain  Vomiting and diarrhea  Acute nonintractable headache, unspecified headache type  Chest pain, unspecified chest pain type      Loney Hering, MD 04/01/15 [phone]

## 2015-04-01 NOTE — ED Notes (Signed)
Pt sleeping at present

## 2015-04-01 NOTE — ED Notes (Signed)
Pt reports dry cough, headache, reports nausea, vomiting diarrhea since yesterday, last episode of diarrhea about 02:30am last episode of vomiting at 03:00am per pt. Pt talks in complete sentences no respiratory distress noted.

## 2015-06-17 ENCOUNTER — Emergency Department
Admission: EM | Admit: 2015-06-17 | Discharge: 2015-06-17 | Disposition: A | Discharge status: Home / Self Care | Payer: Medicaid Other | Attending: Emergency Medicine | Admitting: Emergency Medicine

## 2015-06-17 ENCOUNTER — Emergency Department: Payer: Medicaid Other

## 2015-06-17 DIAGNOSIS — Z79899 Other long term (current) drug therapy: Secondary | ICD-10-CM | POA: Diagnosis not present

## 2015-06-17 DIAGNOSIS — R109 Unspecified abdominal pain: Secondary | ICD-10-CM

## 2015-06-17 DIAGNOSIS — Z8673 Personal history of transient ischemic attack (TIA), and cerebral infarction without residual deficits: Secondary | ICD-10-CM | POA: Insufficient documentation

## 2015-06-17 DIAGNOSIS — F17200 Nicotine dependence, unspecified, uncomplicated: Secondary | ICD-10-CM | POA: Diagnosis not present

## 2015-06-17 DIAGNOSIS — I509 Heart failure, unspecified: Secondary | ICD-10-CM | POA: Insufficient documentation

## 2015-06-17 DIAGNOSIS — I4891 Unspecified atrial fibrillation: Secondary | ICD-10-CM | POA: Insufficient documentation

## 2015-06-17 DIAGNOSIS — I110 Hypertensive heart disease with heart failure: Secondary | ICD-10-CM | POA: Diagnosis not present

## 2015-06-17 DIAGNOSIS — J449 Chronic obstructive pulmonary disease, unspecified: Secondary | ICD-10-CM | POA: Diagnosis not present

## 2015-06-17 DIAGNOSIS — R1011 Right upper quadrant pain: Secondary | ICD-10-CM | POA: Insufficient documentation

## 2015-06-17 LAB — COMPREHENSIVE METABOLIC PANEL
ALT: 12 U/L — ABNORMAL LOW (ref 14–54)
AST: 16 U/L (ref 15–41)
Albumin: 3.9 g/dL (ref 3.5–5.0)
Alkaline Phosphatase: 62 U/L (ref 38–126)
Anion gap: 8 (ref 5–15)
BILIRUBIN TOTAL: 0.5 mg/dL (ref 0.3–1.2)
BUN: 12 mg/dL (ref 6–20)
CO2: 27 mmol/L (ref 22–32)
Calcium: 9.1 mg/dL (ref 8.9–10.3)
Chloride: 107 mmol/L (ref 101–111)
Creatinine, Ser: 0.73 mg/dL (ref 0.44–1.00)
Glucose, Bld: 108 mg/dL — ABNORMAL HIGH (ref 65–99)
POTASSIUM: 3.2 mmol/L — AB (ref 3.5–5.1)
Sodium: 142 mmol/L (ref 135–145)
TOTAL PROTEIN: 7.1 g/dL (ref 6.5–8.1)

## 2015-06-17 LAB — CBC
HEMATOCRIT: 38.2 % (ref 35.0–47.0)
HEMOGLOBIN: 12.3 g/dL (ref 12.0–16.0)
MCH: 28 pg (ref 26.0–34.0)
MCHC: 32.3 g/dL (ref 32.0–36.0)
MCV: 86.7 fL (ref 80.0–100.0)
Platelets: 282 10*3/uL (ref 150–440)
RBC: 4.4 MIL/uL (ref 3.80–5.20)
RDW: 15.7 % — ABNORMAL HIGH (ref 11.5–14.5)
WBC: 9.2 10*3/uL (ref 3.6–11.0)

## 2015-06-17 LAB — URINALYSIS COMPLETE WITH MICROSCOPIC (ARMC ONLY)
BACTERIA UA: NONE SEEN
Bilirubin Urine: NEGATIVE
Glucose, UA: NEGATIVE mg/dL
Hgb urine dipstick: NEGATIVE
Ketones, ur: NEGATIVE mg/dL
NITRITE: NEGATIVE
PROTEIN: 30 mg/dL — AB
SPECIFIC GRAVITY, URINE: 1.025 (ref 1.005–1.030)
pH: 5 (ref 5.0–8.0)

## 2015-06-17 LAB — LIPASE, BLOOD: Lipase: 24 U/L (ref 11–51)

## 2015-06-17 LAB — TROPONIN I

## 2015-06-17 MED ORDER — KETOROLAC TROMETHAMINE 60 MG/2ML IM SOLN
60.0000 mg | Freq: Once | INTRAMUSCULAR | Status: AC
  Administered 2015-06-17: 60 mg via INTRAMUSCULAR
  Filled 2015-06-17: qty 2

## 2015-06-17 MED ORDER — SUCRALFATE 1 G PO TABS
ORAL_TABLET | ORAL | Status: AC
  Filled 2015-06-17: qty 1

## 2015-06-17 MED ORDER — GI COCKTAIL ~~LOC~~
30.0000 mL | Freq: Once | ORAL | Status: AC
  Administered 2015-06-17: 30 mL via ORAL
  Filled 2015-06-17: qty 30

## 2015-06-17 MED ORDER — SUCRALFATE 1 G PO TABS
1.0000 g | ORAL_TABLET | Freq: Once | ORAL | Status: AC
  Administered 2015-06-17: 1 g via ORAL
  Filled 2015-06-17: qty 1

## 2015-06-17 NOTE — ED Provider Notes (Signed)
[facility] Emergency Department Provider Note   ____________________________________________  Time seen: Approximately [id number] AM  I have reviewed the triage vital signs and the nursing notes.   HISTORY  Chief Complaint Abdominal Pain    HPI [name] is a 50 y.o. female who comes into the hospital today with abdominal pain and vomiting. She reports that she has some swelling in her abdomen as well. The patient reports these symptoms have been going on for the past couple of months. The patient was seen in the emergency department a few months ago and she reports she has an appointment with her doctor's office today. She reports that she can't eat and she has a headache. She reports that she's feeling short of breath and she is just hurting. When she was seen last time she reports that she was not given any medicine to go home. The patient has seen her doctor and reports that she's was to be following up today to determine what the next step will be with this abdominal pain that she has been having. The patient is taking Tylenol for pain but reports that does not help. The patient came in tonight because she reports she is unable to tolerate the pain anymore.The patient rates her pain a 10 out of 10 in intensity.   Past Medical History  Diagnosis Date  . CHF (congestive heart failure) ([place])   . A-fib ([place]) 2010  . Stroke ([place])   . Hypertension   . COPD (chronic obstructive pulmonary disease) ([place])   . Gout     There are no active problems to display for this patient.   Past Surgical History  Procedure Laterality Date  . Lt ankle fracture  2002  . Bilateral wrist fractures  2010    Current Outpatient Rx  Name  Route  Sig  Dispense  Refill  . amLODipine (NORVASC) 10 MG tablet   Oral   Take 10 mg by mouth daily.         Marland Kitchen atorvastatin (LIPITOR) 20 MG tablet   Oral   Take 20 mg by mouth daily.         . cloNIDine (CATAPRES) 0.1 MG tablet   Oral   Take 0.1 mg by mouth 2 (two) times daily.         . hydrALAZINE (APRESOLINE) 50 MG tablet   Oral   Take 50 mg by mouth 3 (three) times daily.         Marland Kitchen lisinopril (PRINIVIL,ZESTRIL) 40 MG tablet   Oral   Take 40 mg by mouth daily.         . metoCLOPramide (REGLAN) 10 MG tablet   Oral   Take 1 tablet (10 mg total) by mouth every 8 (eight) hours as needed for nausea.   20 tablet   0   . metoprolol succinate (TOPROL-XL) 100 MG 24 hr tablet   Oral   Take 100 mg by mouth daily. Take with or immediately following a meal.         . sertraline (ZOLOFT) 100 MG tablet   Oral   Take 100 mg by mouth daily.         Marland Kitchen spironolactone (ALDACTONE) 25 MG tablet   Oral   Take 25 mg by mouth daily.           Allergies Morphine and related and Penicillins  No family history on file.  Social History Social History  Substance Use Topics  . Smoking  status: Current Some Day Smoker  . Smokeless tobacco: Never Used  . Alcohol Use: Yes    Review of Systems Constitutional: No fever/chills Eyes: No visual changes. ENT: No sore throat. Cardiovascular: Denies chest pain. Respiratory: Denies shortness of breath. Gastrointestinal: abdominal pain, nausea,  vomiting.  No diarrhea.  No constipation. Genitourinary: Negative for dysuria. Musculoskeletal: Negative for back pain. Skin: Negative for rash. Neurological: Negative for headaches, focal weakness or numbness.  10-point ROS otherwise negative.  ____________________________________________   PHYSICAL EXAM:  VITAL SIGNS: ED Triage Vitals  Enc Vitals Group     BP 06/17/15 0142 144/95 mmHg     Pulse Rate 06/17/15 0142 67     Resp 06/17/15 0142 18     Temp 06/17/15 0142 97.6 F (36.4 C)     Temp Source 06/17/15 0142 Oral     SpO2 06/17/15 0142 97 %     Weight 06/17/15 0142 225 lb (102.059 kg)     Height 06/17/15 0142 5\' 10"  (1.778 m)     Head Cir --      Peak Flow --      Pain Score 06/17/15 0142 10      Pain Loc --      Pain Edu? --      Excl. in [place]? --     Constitutional: Alert and oriented. Well appearing and in Moderate distress. Eyes: Conjunctivae are normal. PERRL. EOMI. Head: Atraumatic. Nose: No congestion/rhinnorhea. Mouth/Throat: Mucous membranes are moist.  Oropharynx non-erythematous. Cardiovascular: Normal rate, regular rhythm. Grossly normal heart sounds.  Good peripheral circulation. Respiratory: Normal respiratory effort.  No retractions. Lungs CTAB. Gastrointestinal: Soft with some right upper quadrant tenderness to palpation. No distention. Positive bowel sounds Musculoskeletal: No lower extremity tenderness nor edema.   Neurologic:  Normal speech and language.  Skin:  Skin is warm, dry and intact.  Psychiatric: Mood and affect are normal.   ____________________________________________   LABS (all labs ordered are listed, but only abnormal results are displayed)  Labs Reviewed  COMPREHENSIVE METABOLIC PANEL - Abnormal; Notable for the following:    Potassium 3.2 (*)    Glucose, Bld 108 (*)    ALT 12 (*)    All other components within normal limits  CBC - Abnormal; Notable for the following:    RDW 15.7 (*)    All other components within normal limits  URINALYSIS COMPLETEWITH MICROSCOPIC ([facility] ONLY) - Abnormal; Notable for the following:    Color, Urine YELLOW (*)    APPearance CLEAR (*)    Protein, ur 30 (*)    Leukocytes, UA TRACE (*)    Squamous Epithelial / LPF 0-5 (*)    All other components within normal limits  LIPASE, BLOOD  TROPONIN I   ____________________________________________  EKG  ED ECG REPORT I, Loney Hering, the attending physician, personally viewed and interpreted this ECG.   Date: 06/17/2015  EKG Time: 148  Rate: 69  Rhythm: normal sinus rhythm  Axis: normal  Intervals:none  ST&T Change: none  ____________________________________________  RADIOLOGY  Right upper quadrant ultrasound: Normal sonographic appearance  of the gallbladder and biliary tree, hepatic steatosis. ____________________________________________   PROCEDURES  Procedure(s) performed: None  Critical Care performed: No  ____________________________________________   INITIAL IMPRESSION / ASSESSMENT AND PLAN / ED COURSE  Pertinent labs & imaging results that were available during my care of the patient were reviewed by me and considered in my medical decision making (see chart for details).  This is a 50 year old female who  comes to the hospital today with some right upper quadrant and epigastric pain. She reports that she's been having this pain for the past 2 months. The patient reports she is been seeing her doctor for the pain but has not gotten any better. The patient had ultrasound that was negative. I gave her GI cocktail, Carafate and Toradol which helped her pain improved. The patient has an appointment with her Primary care physician at 70. She'll be discharged to follow-up with her primary care physician for further evaluation of her discomfort and pain. ____________________________________________   FINAL CLINICAL IMPRESSION(S) / ED DIAGNOSES  Final diagnoses:  Abdominal pain      NEW MEDICATIONS STARTED DURING THIS VISIT:  New Prescriptions   No medications on file     Note:  This document was prepared using Dragon voice recognition software and may include unintentional dictation errors.    Loney Hering, MD 06/17/15 [phone]

## 2015-06-17 NOTE — ED Notes (Signed)
Patient transported to Ultrasound 

## 2015-06-17 NOTE — ED Notes (Signed)
Pt to triage via w/c with no distress noted; brought in by EMS; pt st V x 3 (last at 930pm); has appt later today at [facility]; pt c/o right upper quad pain with hx gallbladder disease for "years"

## 2015-06-17 NOTE — Discharge Instructions (Signed)

## 2015-07-22 ENCOUNTER — Emergency Department
Admission: EM | Admit: 2015-07-22 | Discharge: 2015-07-22 | Disposition: A | Discharge status: Home / Self Care | Payer: Medicaid Other | Attending: Emergency Medicine | Admitting: Emergency Medicine

## 2015-07-22 ENCOUNTER — Encounter: Payer: Self-pay | Admitting: Intensive Care

## 2015-07-22 DIAGNOSIS — Z79899 Other long term (current) drug therapy: Secondary | ICD-10-CM | POA: Insufficient documentation

## 2015-07-22 DIAGNOSIS — J449 Chronic obstructive pulmonary disease, unspecified: Secondary | ICD-10-CM | POA: Insufficient documentation

## 2015-07-22 DIAGNOSIS — Z8673 Personal history of transient ischemic attack (TIA), and cerebral infarction without residual deficits: Secondary | ICD-10-CM | POA: Insufficient documentation

## 2015-07-22 DIAGNOSIS — K529 Noninfective gastroenteritis and colitis, unspecified: Secondary | ICD-10-CM | POA: Diagnosis not present

## 2015-07-22 DIAGNOSIS — F17210 Nicotine dependence, cigarettes, uncomplicated: Secondary | ICD-10-CM | POA: Diagnosis not present

## 2015-07-22 DIAGNOSIS — I4891 Unspecified atrial fibrillation: Secondary | ICD-10-CM | POA: Diagnosis not present

## 2015-07-22 DIAGNOSIS — I509 Heart failure, unspecified: Secondary | ICD-10-CM | POA: Insufficient documentation

## 2015-07-22 DIAGNOSIS — R112 Nausea with vomiting, unspecified: Secondary | ICD-10-CM | POA: Diagnosis present

## 2015-07-22 DIAGNOSIS — I110 Hypertensive heart disease with heart failure: Secondary | ICD-10-CM | POA: Diagnosis not present

## 2015-07-22 LAB — COMPREHENSIVE METABOLIC PANEL
ALBUMIN: 3.5 g/dL (ref 3.5–5.0)
ALK PHOS: 59 U/L (ref 38–126)
ALT: 13 U/L — ABNORMAL LOW (ref 14–54)
ANION GAP: 10 (ref 5–15)
AST: 15 U/L (ref 15–41)
BILIRUBIN TOTAL: 0.6 mg/dL (ref 0.3–1.2)
BUN: 13 mg/dL (ref 6–20)
CALCIUM: 8.9 mg/dL (ref 8.9–10.3)
CO2: 24 mmol/L (ref 22–32)
CREATININE: 0.73 mg/dL (ref 0.44–1.00)
Chloride: 107 mmol/L (ref 101–111)
GFR calc non Af Amer: >60 mL/min (ref >60)
GLUCOSE: 90 mg/dL (ref 65–99)
POTASSIUM: 3.1 mmol/L — AB (ref 3.5–5.1)
SODIUM: 141 mmol/L (ref 135–145)
Total Protein: 6.7 g/dL (ref 6.5–8.1)

## 2015-07-22 LAB — CBC
HCT: 35.6 % (ref 35.0–47.0)
Hemoglobin: 12 g/dL (ref 12.0–16.0)
MCH: 29.4 pg (ref 26.0–34.0)
MCHC: 33.8 g/dL (ref 32.0–36.0)
MCV: 87 fL (ref 80.0–100.0)
Platelets: 225 10*3/uL (ref 150–440)
RBC: 4.09 MIL/uL (ref 3.80–5.20)
RDW: 15 % — AB (ref 11.5–14.5)
WBC: 7 10*3/uL (ref 3.6–11.0)

## 2015-07-22 LAB — LIPASE, BLOOD: Lipase: 22 U/L (ref 11–51)

## 2015-07-22 MED ORDER — ONDANSETRON HCL 4 MG/2ML IJ SOLN
4.0000 mg | Freq: Once | INTRAMUSCULAR | Status: AC
  Administered 2015-07-22: 4 mg via INTRAVENOUS
  Filled 2015-07-22: qty 2

## 2015-07-22 MED ORDER — SODIUM CHLORIDE 0.9 % IV SOLN
1000.0000 mL | Freq: Once | INTRAVENOUS | Status: AC
  Administered 2015-07-22: 1000 mL via INTRAVENOUS

## 2015-07-22 MED ORDER — KETOROLAC TROMETHAMINE 30 MG/ML IJ SOLN
15.0000 mg | Freq: Once | INTRAMUSCULAR | Status: AC
  Administered 2015-07-22: 15 mg via INTRAVENOUS

## 2015-07-22 MED ORDER — KETOROLAC TROMETHAMINE 30 MG/ML IJ SOLN: 15.0000 mg | Freq: Once | INTRAMUSCULAR | Status: DC

## 2015-07-22 MED ORDER — KETOROLAC TROMETHAMINE 30 MG/ML IJ SOLN
INTRAMUSCULAR | Status: AC
  Administered 2015-07-22: 15 mg via INTRAVENOUS
  Filled 2015-07-22: qty 1

## 2015-07-22 MED ORDER — ONDANSETRON HCL 4 MG PO TABS: 4.0000 mg | ORAL_TABLET | Freq: Every day | ORAL | Status: DC | PRN

## 2015-07-22 NOTE — ED Provider Notes (Signed)
[facility] Emergency Department Provider Note  ____________________________________________    I have reviewed the triage vital signs and the nursing notes.   HISTORY  Chief Complaint Nausea; Emesis; and Diarrhea    HPI [name] is a 50 y.o. female who presents with nausea vomiting headache, sore throat, body aches, diarrhea which started yesterday. She denies abdominal pain. She denies sick contacts. No recent travel, she recently moved into a new apartment. No fevers chills.     Past Medical History  Diagnosis Date  . CHF (congestive heart failure) ([place])   . A-fib ([place]) 2010  . Stroke ([place])   . Hypertension   . COPD (chronic obstructive pulmonary disease) ([place])   . Gout     There are no active problems to display for this patient.   Past Surgical History  Procedure Laterality Date  . Lt ankle fracture  2002  . Bilateral wrist fractures  2010    Current Outpatient Rx  Name  Route  Sig  Dispense  Refill  . amLODipine (NORVASC) 10 MG tablet   Oral   Take 10 mg by mouth daily.         Marland Kitchen atorvastatin (LIPITOR) 20 MG tablet   Oral   Take 20 mg by mouth daily.         . cloNIDine (CATAPRES) 0.1 MG tablet   Oral   Take 0.1 mg by mouth 2 (two) times daily.         . hydrALAZINE (APRESOLINE) 50 MG tablet   Oral   Take 50 mg by mouth 3 (three) times daily.         Marland Kitchen lisinopril (PRINIVIL,ZESTRIL) 40 MG tablet   Oral   Take 40 mg by mouth daily.         . metoCLOPramide (REGLAN) 10 MG tablet   Oral   Take 1 tablet (10 mg total) by mouth every 8 (eight) hours as needed for nausea.   20 tablet   0   . metoprolol succinate (TOPROL-XL) 100 MG 24 hr tablet   Oral   Take 100 mg by mouth daily. Take with or immediately following a meal.         . sertraline (ZOLOFT) 100 MG tablet   Oral   Take 100 mg by mouth daily.         Marland Kitchen spironolactone (ALDACTONE) 25 MG tablet   Oral   Take 25 mg by mouth daily.            Allergies Morphine and related and Penicillins  History reviewed. No pertinent family history.  Social History Social History  Substance Use Topics  . Smoking status: Current Some Day Smoker    Types: Cigarettes  . Smokeless tobacco: Never Used  . Alcohol Use: Yes     Comment: occ    Review of Systems  Constitutional: Negative for fever. Eyes: Negative for redness ENT: Negative for sore throat Cardiovascular: Negative for chest pain Respiratory: Negative for shortness of breath. Gastrointestinal: Negative for abdominal pain, positive for nausea vomiting diarrhea Genitourinary: Negative for dysuria. Musculoskeletal: Negative for back pain. No joint swelling Skin: Negative for rash. Neurological: Negative for focal weakness, positive for headache Psychiatric: Mild anxiety    ____________________________________________   PHYSICAL EXAM:  VITAL SIGNS: ED Triage Vitals  Enc Vitals Group     BP 07/22/15 0807 154/112 mmHg     Pulse Rate 07/22/15 0809 78     Resp 07/22/15 0809 18  Temp 07/22/15 0814 97.7 F (36.5 C)     Temp Source 07/22/15 0814 Oral     SpO2 07/22/15 0809 96 %     Weight 07/22/15 0814 228 lb 3.2 oz (103.511 kg)     Height 07/22/15 0814 5\' 9"  (1.753 m)     Head Cir --      Peak Flow --      Pain Score 07/22/15 0815 10     Pain Loc --      Pain Edu? --      Excl. in [place]? --      Constitutional: Alert and oriented. Well appearing and in no distress.  Eyes: Conjunctivae are normal. No erythema or injection ENT   Head: Normocephalic and atraumatic.   Mouth/Throat: Mucous membranes are moist.Pharynx normal Cardiovascular: Normal rate, Irregularly irregular rhythm. Normal and symmetric distal pulses are present in the upper extremities Respiratory: Normal respiratory effort without tachypnea nor retractions. Breath sounds are clear and equal bilaterally.  Gastrointestinal: Soft and non-tender in all quadrants. No distention. There is no  CVA tenderness. Genitourinary: deferred Musculoskeletal: Nontender with normal range of motion in all extremities. No lower extremity tenderness nor edema. Neurologic:  Normal speech and language. No gross focal neurologic deficits are appreciated. Skin:  Skin is warm, dry and intact. No rash noted. Psychiatric: Mood and affect are normal. Patient exhibits appropriate insight and judgment.  ____________________________________________    LABS (pertinent positives/negatives)  Labs Reviewed  CBC  COMPREHENSIVE METABOLIC PANEL  LIPASE, BLOOD    ____________________________________________   EKG  None  ____________________________________________    RADIOLOGY  None  ____________________________________________   PROCEDURES  Procedure(s) performed: none  Critical Care performed: none  ____________________________________________   INITIAL IMPRESSION / ASSESSMENT AND PLAN / ED COURSE  Pertinent labs & imaging results that were available during my care of the patient were reviewed by me and considered in my medical decision making (see chart for details).  Patient denies nausea vomiting diarrhea, mild headache, sore throat, suspect viral etiology we will treat symptomatically and reevaluate.  Patient feels significant better after IV fluids, Zofran. Feel she is appropriate for outpatient management with supportive care. Return precautions discussed. Patient agrees with the plan.  ____________________________________________   FINAL CLINICAL IMPRESSION(S) / ED DIAGNOSES  Final diagnoses:  None          Lavonia Drafts, MD 07/22/15 1520

## 2015-07-22 NOTE — ED Notes (Addendum)
Pt arrived by EMS from home. PT c/o N/V/D and  Headache since last night. Pt states she only took her hydrochlorothiazide this morning but not her other meds for HTN. PT has HX HTN, CVA. EMS vitals 158/102b/p, 84 blood sugar. Pt A&O x4 EMS gave 4 zofran

## 2016-03-14 ENCOUNTER — Encounter: Payer: Self-pay | Admitting: Emergency Medicine

## 2016-03-14 ENCOUNTER — Emergency Department
Admission: EM | Admit: 2016-03-14 | Discharge: 2016-03-15 | Disposition: A | Discharge status: Home / Self Care | Payer: Medicaid Other | Attending: Student in an Organized Health Care Education/Training Program | Admitting: Student in an Organized Health Care Education/Training Program

## 2016-03-14 ENCOUNTER — Emergency Department: Payer: Medicaid Other

## 2016-03-14 DIAGNOSIS — Z79899 Other long term (current) drug therapy: Secondary | ICD-10-CM | POA: Diagnosis not present

## 2016-03-14 DIAGNOSIS — Z87891 Personal history of nicotine dependence: Secondary | ICD-10-CM | POA: Diagnosis not present

## 2016-03-14 DIAGNOSIS — J449 Chronic obstructive pulmonary disease, unspecified: Secondary | ICD-10-CM | POA: Insufficient documentation

## 2016-03-14 DIAGNOSIS — R0789 Other chest pain: Secondary | ICD-10-CM | POA: Diagnosis not present

## 2016-03-14 DIAGNOSIS — I110 Hypertensive heart disease with heart failure: Secondary | ICD-10-CM | POA: Diagnosis not present

## 2016-03-14 DIAGNOSIS — I509 Heart failure, unspecified: Secondary | ICD-10-CM | POA: Diagnosis not present

## 2016-03-14 DIAGNOSIS — R0781 Pleurodynia: Secondary | ICD-10-CM

## 2016-03-14 MED ORDER — KETOROLAC TROMETHAMINE 60 MG/2ML IM SOLN
30.0000 mg | Freq: Once | INTRAMUSCULAR | Status: AC
  Administered 2016-03-14: 30 mg via INTRAMUSCULAR
  Filled 2016-03-14: qty 2

## 2016-03-14 MED ORDER — ORPHENADRINE CITRATE 30 MG/ML IJ SOLN
60.0000 mg | INTRAMUSCULAR | Status: AC
  Administered 2016-03-14: 60 mg via INTRAMUSCULAR
  Filled 2016-03-14: qty 2

## 2016-03-14 NOTE — ED Triage Notes (Signed)
Pt presents to ED with left sided rib and back pain and pain under her left breast. Pt states 2 weeks ago she rolled over in bed and heard a loud pop and has been experiencing these symptoms since then. Pt reports her pain does not improve with rest or medications. Feels similar to when she was in [facility] and broke a couple of her ribs. Pt currently has no increased work of breathing or acute distress noted at this time.

## 2016-03-14 NOTE — ED Notes (Signed)
Pt states she was sleeping approx 2 weeks ago and she rolled over and felt a "pop" in her L chest under her breast. Pain is worse with inspiration and palpation and reclining at this time.

## 2016-03-15 MED ORDER — CYCLOBENZAPRINE HCL 5 MG PO TABS: 5.0000 mg | ORAL_TABLET | Freq: Three times a day (TID) | ORAL | 0 refills | Status: DC | PRN

## 2016-03-15 MED ORDER — HYDROCODONE-ACETAMINOPHEN 5-325 MG PO TABS: 1.0000 | ORAL_TABLET | Freq: Three times a day (TID) | ORAL | 0 refills | Status: DC | PRN

## 2016-03-15 MED ORDER — KETOROLAC TROMETHAMINE 10 MG PO TABS: 10.0000 mg | ORAL_TABLET | Freq: Three times a day (TID) | ORAL | 0 refills | Status: DC

## 2016-03-15 MED ORDER — HYDROCODONE-ACETAMINOPHEN 5-325 MG PO TABS: 1.0000 | ORAL_TABLET | Freq: Once | ORAL | Status: DC

## 2016-03-15 NOTE — ED Provider Notes (Signed)
[facility] Emergency Department Provider Note ____________________________________________  Time seen: 2121  I have reviewed the triage vital signs and the nursing notes.  HISTORY  Chief Complaint  Chest Pain and Back Pain  HPI [name] is a 51 y.o. female presents to the ED for evaluation of left-sided rib pain.Patient denies any recent injury, trauma, or accident. She describes it about 2 weeks prior she rolled over in the bed, in her loud pop "like a twig braking," to the left side of her anterior chest has. She reports the pain is not improved in the interim with rest or medications. She lives seen her primary care provider in the interim for same complaint. She reports that the x-ray today took did not show any acute rib fractures, and the provider did not give her any pain medicines. She presents now with continued left-sided rib pain localizing the pain just under her left breast with some referral towards the back of her shoulder blade. She gives a remote history of multiple rib fractures, several years ago. She denies any interim shortness of breath, wheezing, or hemoptysis.  Past Medical History:  Diagnosis Date  . A-fib ([place]) 2010  . CHF (congestive heart failure) ([place])   . COPD (chronic obstructive pulmonary disease) ([place])   . Gout   . Hypertension   . Stroke [facility])     There are no active problems to display for this patient.   Past Surgical History:  Procedure Laterality Date  . bilateral wrist fractures  2010  . lt ankle fracture  2002    Prior to Admission medications   Medication Sig Start Date End Date Taking? Authorizing Provider  amLODipine (NORVASC) 10 MG tablet Take 10 mg by mouth daily.    Historical Provider, MD  atorvastatin (LIPITOR) 20 MG tablet Take 20 mg by mouth daily.    Historical Provider, MD  cloNIDine (CATAPRES) 0.1 MG tablet Take 0.1 mg by mouth 2 (two) times daily.    Historical Provider, MD  cyclobenzaprine  (FLEXERIL) 5 MG tablet Take 1 tablet (5 mg total) by mouth 3 (three) times daily as needed for muscle spasms. 03/15/16   Lorissa Kishbaugh V Bacon Devaun Hernandez, PA-C  hydrALAZINE (APRESOLINE) 50 MG tablet Take 50 mg by mouth 3 (three) times daily.    Historical Provider, MD  HYDROcodone-acetaminophen (NORCO) 5-325 MG tablet Take 1 tablet by mouth 3 (three) times daily as needed. 03/15/16   Japhet Morgenthaler V Bacon Anaise Sterbenz, PA-C  ketorolac (TORADOL) 10 MG tablet Take 1 tablet (10 mg total) by mouth every 8 (eight) hours. 03/15/16   Jaquasia Doscher V Bacon Kharson Rasmusson, PA-C  lisinopril (PRINIVIL,ZESTRIL) 40 MG tablet Take 40 mg by mouth daily.    Historical Provider, MD  metoCLOPramide (REGLAN) 10 MG tablet Take 1 tablet (10 mg total) by mouth every 8 (eight) hours as needed for nausea. 04/01/15 03/31/16  Loney Hering, MD  metoprolol succinate (TOPROL-XL) 100 MG 24 hr tablet Take 100 mg by mouth daily. Take with or immediately following a meal.    Historical Provider, MD  ondansetron (ZOFRAN) 4 MG tablet Take 1 tablet (4 mg total) by mouth daily as needed for nausea or vomiting. 07/22/15   Lavonia Drafts, MD  sertraline (ZOLOFT) 100 MG tablet Take 100 mg by mouth daily.    Historical Provider, MD  spironolactone (ALDACTONE) 25 MG tablet Take 25 mg by mouth daily.    Historical Provider, MD    Allergies Morphine and related and Penicillins  No family history on  file.  Social History Social History  Substance Use Topics  . Smoking status: Former Smoker    Types: Cigarettes  . Smokeless tobacco: Never Used  . Alcohol use Yes     Comment: occ    Review of Systems  Constitutional: Negative for fever. Cardiovascular: Negative for chest pain. Respiratory: Negative for shortness of breath. Musculoskeletal: Negative for back pain. Left chest wall pain as above.  Skin: Negative for rash. Neurological: Negative for headaches, focal weakness or numbness. ____________________________________________  PHYSICAL EXAM:  VITAL SIGNS: ED  Triage Vitals [03/14/16 2029]  Enc Vitals Group     BP (!) 135/94     Pulse Rate 71     Resp 18     Temp 98.3 F (36.8 C)     Temp Source Oral     SpO2 94 %     Weight 225 lb (102.1 kg)     Height 5\' 9"  (1.753 m)     Head Circumference      Peak Flow      Pain Score 10     Pain Loc      Pain Edu?      Excl. in [place]?     Constitutional: Alert and oriented. Well appearing and in no distress. Head: Normocephalic and atraumatic. Cardiovascular: Normal rate, regular rhythm. Normal distal pulses. Respiratory: Normal respiratory effort. No wheezes/rales/rhonchi. Gastrointestinal: Soft and nontender. No distention. Musculoskeletal: Obvious chest wall deformity, bruise, or ecchymosis. Patient vomited palpation to the anterior and midaxillary line of the ribs. Nontender with normal range of motion in all extremities.  Neurologic:  Normal gait without ataxia. Normal speech and language. No gross focal neurologic deficits are appreciated. Skin:  Skin is warm, dry and intact. No rash noted. ___________________________________________   RADIOLOGY  Left Rib Detail  FINDINGS: The cardio pericardial silhouette is enlarged. The lungs are clear wiithout focal pneumonia, edema, pneumothorax or pleural effusion. Numerous bilateral rib fractures are evident. Most of these appear nonacute, but the bilateral rib distortion make identification of a nondisplaced acute breath fracture quite difficult.  I, Sascha Palma, Dannielle Karvonen, personally viewed and evaluated these images (plain radiographs) as part of my medical decision making, as well as reviewing the written report by the radiologist. ____________________________________________  PROCEDURES  Toradol 30 mg IM Norflex 60 mg IM ____________________________________________  INITIAL IMPRESSION / ASSESSMENT AND PLAN / ED COURSE  Patient discharged with prescription for Flexeril, hydrocodone, and Toradol dose for acute chest wall pain. Her  x-rays reassurance that shows no acute rib fracture or dislocation. Patient will follow with primary care provider or return to the ED for respiratory symptoms worsen or pain that does not improve with treatment. ____________________________________________  FINAL CLINICAL IMPRESSION(S) / ED DIAGNOSES  Final diagnoses:  Chest wall pain  Rib pain on left side      Melvenia Needles, PA-C 03/15/16 0117    Merlyn Lot, MD 03/15/16 1040

## 2016-03-15 NOTE — Discharge Instructions (Signed)
Your x-ray did not reveal a new rib fracture. You may have experienced a focal rib injury or a popping rib injury. Take the prescription meds as directed. Apply ice or moist heat to the chest wall. Follow-up with your provider for continued painful ribs.

## 2016-05-07 ENCOUNTER — Telehealth: Payer: Self-pay | Admitting: Emergency Medicine

## 2016-05-07 ENCOUNTER — Emergency Department
Admission: EM | Admit: 2016-05-07 | Discharge: 2016-05-07 | Disposition: A | Discharge status: Home / Self Care | Payer: Medicaid Other | Attending: Emergency Medicine | Admitting: Emergency Medicine

## 2016-05-07 ENCOUNTER — Encounter: Payer: Self-pay | Admitting: Emergency Medicine

## 2016-05-07 DIAGNOSIS — J449 Chronic obstructive pulmonary disease, unspecified: Secondary | ICD-10-CM | POA: Insufficient documentation

## 2016-05-07 DIAGNOSIS — Z87891 Personal history of nicotine dependence: Secondary | ICD-10-CM | POA: Insufficient documentation

## 2016-05-07 DIAGNOSIS — M10271 Drug-induced gout, right ankle and foot: Secondary | ICD-10-CM | POA: Insufficient documentation

## 2016-05-07 DIAGNOSIS — I110 Hypertensive heart disease with heart failure: Secondary | ICD-10-CM | POA: Diagnosis not present

## 2016-05-07 DIAGNOSIS — T504X5A Adverse effect of drugs affecting uric acid metabolism, initial encounter: Secondary | ICD-10-CM | POA: Diagnosis not present

## 2016-05-07 DIAGNOSIS — M79671 Pain in right foot: Secondary | ICD-10-CM | POA: Diagnosis present

## 2016-05-07 DIAGNOSIS — Z79899 Other long term (current) drug therapy: Secondary | ICD-10-CM | POA: Insufficient documentation

## 2016-05-07 DIAGNOSIS — I509 Heart failure, unspecified: Secondary | ICD-10-CM | POA: Diagnosis not present

## 2016-05-07 MED ORDER — COLCHICINE 0.6 MG PO TABS: ORAL_TABLET | ORAL | 0 refills | Status: DC

## 2016-05-07 MED ORDER — HYDROCODONE-ACETAMINOPHEN 5-325 MG PO TABS: 1.0000 | ORAL_TABLET | ORAL | 0 refills | Status: DC | PRN

## 2016-05-07 MED ORDER — HYDROCODONE-ACETAMINOPHEN 5-325 MG PO TABS
1.0000 | ORAL_TABLET | Freq: Once | ORAL | Status: AC
  Administered 2016-05-07: 1 via ORAL

## 2016-05-07 MED ORDER — HYDROCODONE-ACETAMINOPHEN 5-325 MG PO TABS
ORAL_TABLET | ORAL | Status: AC
  Filled 2016-05-07: qty 1

## 2016-05-07 NOTE — ED Triage Notes (Signed)
Pt to triage via [place] in NAD, report gout flare up in right foot x 2 days, redness/swelling noted, pt states unable to sleep due to pain.

## 2016-05-07 NOTE — ED Notes (Signed)
Provider made aware of pts BP, pt was instructed to take BP med when she gets home. Pt will f/u with PCP.

## 2016-05-07 NOTE — ED Notes (Signed)
Pt reports gout pain to rt foot x 2 days, hx of same.

## 2016-05-07 NOTE — ED Provider Notes (Signed)
[facility] Emergency Department Provider Note   ____________________________________________   First MD Initiated Contact with Patient 05/07/16 [phone]     (approximate)  I have reviewed the triage vital signs and the nursing notes.   HISTORY  Chief Complaint Foot Pain    HPI [name] is a 51 y.o. female is here with complaint of gout flareup 2 days. Patient states that she has been up all night with pain in her foot. She states that she has had problems with gout in the past and currently is not taking allupurinal. Patient also did not take her blood pressure medication today as she was in so much pain that she forgot. He said in the past has taken Medication without any difficulty. Her PCP was unable to see her today. Patient rates her pain as a 10 over 10.   Past Medical History:  Diagnosis Date  . A-fib ([place]) 2010  . CHF (congestive heart failure) ([place])   . COPD (chronic obstructive pulmonary disease) ([place])   . Gout   . Hypertension   . Stroke [facility])     There are no active problems to display for this patient.   Past Surgical History:  Procedure Laterality Date  . bilateral wrist fractures  2010  . lt ankle fracture  2002    Prior to Admission medications   Medication Sig Start Date End Date Taking? Authorizing Provider  amLODipine (NORVASC) 10 MG tablet Take 10 mg by mouth daily.    Historical Provider, MD  atorvastatin (LIPITOR) 20 MG tablet Take 20 mg by mouth daily.    Historical Provider, MD  cloNIDine (CATAPRES) 0.1 MG tablet Take 0.1 mg by mouth 2 (two) times daily.    Historical Provider, MD  colchicine 0.6 MG tablet Take 1 every 4 hours until pain relief or stomach upset 05/07/16 05/09/16  Johnn Hai, PA-C  hydrALAZINE (APRESOLINE) 50 MG tablet Take 50 mg by mouth 3 (three) times daily.    Historical Provider, MD  HYDROcodone-acetaminophen (NORCO/VICODIN) 5-325 MG tablet Take 1 tablet by mouth every 4 (four) hours as  needed for moderate pain. 05/07/16   Johnn Hai, PA-C  lisinopril (PRINIVIL,ZESTRIL) 40 MG tablet Take 40 mg by mouth daily.    Historical Provider, MD  metoCLOPramide (REGLAN) 10 MG tablet Take 1 tablet (10 mg total) by mouth every 8 (eight) hours as needed for nausea. 04/01/15 03/31/16  Loney Hering, MD  metoprolol succinate (TOPROL-XL) 100 MG 24 hr tablet Take 100 mg by mouth daily. Take with or immediately following a meal.    Historical Provider, MD  ondansetron (ZOFRAN) 4 MG tablet Take 1 tablet (4 mg total) by mouth daily as needed for nausea or vomiting. 07/22/15   Lavonia Drafts, MD  sertraline (ZOLOFT) 100 MG tablet Take 100 mg by mouth daily.    Historical Provider, MD  spironolactone (ALDACTONE) 25 MG tablet Take 25 mg by mouth daily.    Historical Provider, MD    Allergies Morphine and related and Penicillins  History reviewed. No pertinent family history.  Social History Social History  Substance Use Topics  . Smoking status: Former Smoker    Types: Cigarettes  . Smokeless tobacco: Never Used  . Alcohol use Yes     Comment: occ    Review of Systems Constitutional: No fever/chills Eyes: No visual changes. Cardiovascular: Denies chest pain. Respiratory: Denies shortness of breath. Gastrointestinal: No abdominal pain.  No nausea, no vomiting. Musculoskeletal: Positive pain right foot.  Skin: Positive erythema right foot. Neurological: Negative for headaches, focal weakness or numbness.  10-point ROS otherwise negative.  ____________________________________________   PHYSICAL EXAM:  VITAL SIGNS: ED Triage Vitals  Enc Vitals Group     BP 05/07/16 0654 (!) 160/122     Pulse Rate 05/07/16 0654 92     Resp 05/07/16 0654 20     Temp 05/07/16 0654 98.4 F (36.9 C)     Temp Source 05/07/16 0654 Oral     SpO2 05/07/16 0654 95 %     Weight 05/07/16 0655 224 lb (101.6 kg)     Height 05/07/16 0655 5\' 10"  (1.778 m)     Head Circumference --      Peak Flow --       Pain Score 05/07/16 0654 10     Pain Loc --      Pain Edu? --      Excl. in [place]? --     Constitutional: Alert and oriented. Well appearing and in no acute distress. Eyes: Conjunctivae are normal. PERRL. EOMI. Head: Atraumatic. Nose: No congestion/rhinnorhea. Neck: No stridor.   Cardiovascular: Normal rate, regular rhythm. Grossly normal heart sounds.  Good peripheral circulation. Respiratory: Normal respiratory effort.  No retractions. Lungs CTAB. Musculoskeletal: Exam right foot first metatarsal MP joint is extremely tender to touch, erythematous and warm. Skin is intact. Capillary refill is less than 3 seconds. Motor sensory function intact. Pulses positive. Neurologic:  Normal speech and language. No gross focal neurologic deficits are appreciated. No gait instability. Skin:  Skin is warm, dry and intact. No rash noted. Psychiatric: Mood and affect are normal. Speech and behavior are normal.  ____________________________________________   LABS (all labs ordered are listed, but only abnormal results are displayed)  Labs Reviewed - No data to display _  PROCEDURES  Procedure(s) performed: None  Procedures  Critical Care performed: No  ____________________________________________   INITIAL IMPRESSION / ASSESSMENT AND PLAN / ED COURSE  Pertinent labs & imaging results that were available during my care of the patient were reviewed by me and considered in my medical decision making (see chart for details).  Patient was given a prescription for colchicine and Norco. She is also given Norco while in the emergency room for her pain. She is encouraged to call her PCP for follow-up. She is reminded to take her blood pressure medication when she arrives at home.      ____________________________________________   FINAL CLINICAL IMPRESSION(S) / ED DIAGNOSES  Final diagnoses:  Acute drug-induced gout of right foot      NEW MEDICATIONS STARTED DURING THIS  VISIT:  Discharge Medication List as of 05/07/2016  7:21 AM    START taking these medications   Details  colchicine 0.6 MG tablet Take 1 every 4 hours until pain relief or stomach upset, Print         Note:  This document was prepared using Dragon voice recognition software and may include unintentional dictation errors.    Johnn Hai, PA-C 05/07/16 [place], PA-C 05/07/16 [place], MD 05/07/16 [phone]

## 2016-05-07 NOTE — Discharge Instructions (Signed)
Follow-up with your primary care doctor at [facility] if any continued problems. Begin taking colchicine as directed. Discontinue if you get stomach upset or diarrhea. Norco as needed for pain. Increase fluids.

## 2016-05-22 ENCOUNTER — Emergency Department
Admission: EM | Admit: 2016-05-22 | Discharge: 2016-05-22 | Disposition: A | Discharge status: Home / Self Care | Payer: Medicaid Other | Attending: Emergency Medicine | Admitting: Emergency Medicine

## 2016-05-22 ENCOUNTER — Emergency Department: Payer: Medicaid Other

## 2016-05-22 DIAGNOSIS — E876 Hypokalemia: Secondary | ICD-10-CM | POA: Diagnosis not present

## 2016-05-22 DIAGNOSIS — R0602 Shortness of breath: Secondary | ICD-10-CM | POA: Diagnosis present

## 2016-05-22 DIAGNOSIS — J449 Chronic obstructive pulmonary disease, unspecified: Secondary | ICD-10-CM | POA: Insufficient documentation

## 2016-05-22 DIAGNOSIS — I110 Hypertensive heart disease with heart failure: Secondary | ICD-10-CM | POA: Insufficient documentation

## 2016-05-22 DIAGNOSIS — Z87891 Personal history of nicotine dependence: Secondary | ICD-10-CM | POA: Insufficient documentation

## 2016-05-22 DIAGNOSIS — I509 Heart failure, unspecified: Secondary | ICD-10-CM | POA: Diagnosis not present

## 2016-05-22 DIAGNOSIS — R609 Edema, unspecified: Secondary | ICD-10-CM

## 2016-05-22 DIAGNOSIS — Z79899 Other long term (current) drug therapy: Secondary | ICD-10-CM | POA: Insufficient documentation

## 2016-05-22 LAB — BRAIN NATRIURETIC PEPTIDE: B Natriuretic Peptide: 157 pg/mL — ABNORMAL HIGH (ref 0.0–100.0)

## 2016-05-22 LAB — CBC WITH DIFFERENTIAL/PLATELET
BASOS ABS: 0.1 10*3/uL (ref 0–0.1)
BASOS PCT: 1 %
Eosinophils Absolute: 0.1 10*3/uL (ref 0–0.7)
Eosinophils Relative: 1 %
HEMATOCRIT: 37.9 % (ref 35.0–47.0)
HEMOGLOBIN: 12.4 g/dL (ref 12.0–16.0)
Lymphocytes Relative: 14 %
Lymphs Abs: 1.4 10*3/uL (ref 1.0–3.6)
MCH: 28.2 pg (ref 26.0–34.0)
MCHC: 32.6 g/dL (ref 32.0–36.0)
MCV: 86.5 fL (ref 80.0–100.0)
MONOS PCT: 8 %
Monocytes Absolute: 0.7 10*3/uL (ref 0.2–0.9)
NEUTROS ABS: 7.7 10*3/uL — AB (ref 1.4–6.5)
NEUTROS PCT: 76 %
Platelets: 401 10*3/uL (ref 150–440)
RBC: 4.38 MIL/uL (ref 3.80–5.20)
RDW: 14.8 % — ABNORMAL HIGH (ref 11.5–14.5)
WBC: 10.1 10*3/uL (ref 3.6–11.0)

## 2016-05-22 LAB — COMPREHENSIVE METABOLIC PANEL
ALBUMIN: 3.9 g/dL (ref 3.5–5.0)
ALK PHOS: 60 U/L (ref 38–126)
ALT: 11 U/L — AB (ref 14–54)
AST: 11 U/L — AB (ref 15–41)
Anion gap: 11 (ref 5–15)
BUN: 16 mg/dL (ref 6–20)
CALCIUM: 9.1 mg/dL (ref 8.9–10.3)
CO2: 21 mmol/L — ABNORMAL LOW (ref 22–32)
Chloride: 109 mmol/L (ref 101–111)
Creatinine, Ser: 0.71 mg/dL (ref 0.44–1.00)
GFR calc Af Amer: >60 mL/min (ref >60)
GFR calc non Af Amer: >60 mL/min (ref >60)
GLUCOSE: 122 mg/dL — AB (ref 65–99)
Potassium: 2.9 mmol/L — ABNORMAL LOW (ref 3.5–5.1)
Sodium: 141 mmol/L (ref 135–145)
TOTAL PROTEIN: 7.4 g/dL (ref 6.5–8.1)
Total Bilirubin: 0.3 mg/dL (ref 0.3–1.2)

## 2016-05-22 LAB — TROPONIN I: Troponin I: <0.03 ng/mL (ref <0.03)

## 2016-05-22 MED ORDER — SPIRONOLACTONE 25 MG PO TABS
37.5000 mg | ORAL_TABLET | ORAL | Status: DC
  Filled 2016-05-22: qty 1

## 2016-05-22 MED ORDER — POTASSIUM CHLORIDE CRYS ER 20 MEQ PO TBCR
40.0000 meq | EXTENDED_RELEASE_TABLET | Freq: Once | ORAL | Status: AC
  Administered 2016-05-22: 40 meq via ORAL
  Filled 2016-05-22: qty 2

## 2016-05-22 MED ORDER — POTASSIUM CHLORIDE CRYS ER 20 MEQ PO TBCR: 20.0000 meq | EXTENDED_RELEASE_TABLET | Freq: Two times a day (BID) | ORAL | 0 refills | Status: DC

## 2016-05-22 NOTE — ED Notes (Signed)
Pt refusing final blood pressure reading

## 2016-05-22 NOTE — ED Provider Notes (Signed)
[facility] Emergency Department Provider Note  ____________________________________________   First MD Initiated Contact with Patient 05/22/16 [phone]     (approximate)  I have reviewed the triage vital signs and the nursing notes.   HISTORY  Chief Complaint Shortness of Breath and Leg Swelling    HPI [name] is a 51 y.o. female with a medical history as listed below who presents for evaluation of increasing bilateral lower extremity edema and some increasing shortness of breath. The symptoms have been gradual and onset over the last week. She takes a fluid pill but she cannot remember which one. She was seen recently for evaluation of possible gout and started on colchicine. She was concerned that the colchicine was what was making her swelling worse. She describes her shortness of breath is mild to moderate and worse with exertion. She states that she always has chest pain and has for more than a year and that is not new or different. She is not in any distress at baseline and only notices her shortness of breath with activity. She cannot remember who her cardiologist is at this time. She denies fever/chills, nausea, vomiting, abdominal pain, dysuria. She is not been off of her medications recently. Nothing in particular makes it better.  Past Medical History:  Diagnosis Date  . A-fib ([place]) 2010  . CHF (congestive heart failure) ([place])   . COPD (chronic obstructive pulmonary disease) ([place])   . Gout   . Hypertension   . Stroke [facility])     There are no active problems to display for this patient.   Past Surgical History:  Procedure Laterality Date  . bilateral wrist fractures  2010  . lt ankle fracture  2002    Prior to Admission medications   Medication Sig Start Date End Date Taking? Authorizing Provider  amLODipine (NORVASC) 10 MG tablet Take 10 mg by mouth daily.    Historical Provider, MD  atorvastatin (LIPITOR) 20 MG tablet Take 20 mg by  mouth daily.    Historical Provider, MD  cloNIDine (CATAPRES) 0.1 MG tablet Take 0.1 mg by mouth 2 (two) times daily.    Historical Provider, MD  colchicine 0.6 MG tablet Take 1 every 4 hours until pain relief or stomach upset 05/07/16 05/09/16  Johnn Hai, PA-C  hydrALAZINE (APRESOLINE) 50 MG tablet Take 50 mg by mouth 3 (three) times daily.    Historical Provider, MD  HYDROcodone-acetaminophen (NORCO/VICODIN) 5-325 MG tablet Take 1 tablet by mouth every 4 (four) hours as needed for moderate pain. 05/07/16   Johnn Hai, PA-C  lisinopril (PRINIVIL,ZESTRIL) 40 MG tablet Take 40 mg by mouth daily.    Historical Provider, MD  metoCLOPramide (REGLAN) 10 MG tablet Take 1 tablet (10 mg total) by mouth every 8 (eight) hours as needed for nausea. 04/01/15 03/31/16  Loney Hering, MD  metoprolol succinate (TOPROL-XL) 100 MG 24 hr tablet Take 100 mg by mouth daily. Take with or immediately following a meal.    Historical Provider, MD  ondansetron (ZOFRAN) 4 MG tablet Take 1 tablet (4 mg total) by mouth daily as needed for nausea or vomiting. 07/22/15   Lavonia Drafts, MD  potassium chloride SA (KLOR-CON M20) 20 MEQ tablet Take 1 tablet (20 mEq total) by mouth 2 (two) times daily. 05/22/16   Hinda Kehr, MD  sertraline (ZOLOFT) 100 MG tablet Take 100 mg by mouth daily.    Historical Provider, MD  spironolactone (ALDACTONE) 25 MG tablet Take 25 mg by  mouth daily.    Historical Provider, MD    Allergies Morphine and related and Penicillins  No family history on file.  Social History Social History  Substance Use Topics  . Smoking status: Former Smoker    Types: Cigarettes  . Smokeless tobacco: Never Used  . Alcohol use Yes     Comment: occ    Review of Systems Constitutional: No fever/chills Eyes: No visual changes. ENT: No sore throat. Cardiovascular: Denies chest pain. Respiratory: gradually increasing shortness of breath over the last week primarily with exertion Gastrointestinal: No  abdominal pain.  No nausea, no vomiting.  No diarrhea.  No constipation. Genitourinary: Negative for dysuria. Musculoskeletal: increasing swelling and pain in bilateral lower extremities over the last week Integumentary: Negative for rash. Neurological: Negative for headaches, focal weakness or numbness.   ____________________________________________   PHYSICAL EXAM:  VITAL SIGNS: ED Triage Vitals  Enc Vitals Group     BP 05/22/16 0117 (!) 153/113     Pulse Rate 05/22/16 0117 92     Resp 05/22/16 0117 18     Temp 05/22/16 0117 98.3 F (36.8 C)     Temp Source 05/22/16 0117 Oral     SpO2 05/22/16 0117 96 %     Weight 05/22/16 0117 224 lb (101.6 kg)     Height 05/22/16 0117 5\' 9"  (1.753 m)     Head Circumference --      Peak Flow --      Pain Score 05/22/16 0116 9     Pain Loc --      Pain Edu? --      Excl. in [place]? --     Constitutional: Alert and oriented. Well appearing and in no acute distress. Eyes: Conjunctivae are normal. PERRL. EOMI. Head: Atraumatic. Nose: No congestion/rhinnorhea. Mouth/Throat: Mucous membranes are moist. Neck: No stridor.  No meningeal signs.   Cardiovascular: Normal rate, regular rhythm. Good peripheral circulation. Grossly normal heart sounds. Respiratory: Normal respiratory effort.  No retractions. Lungs CTAB. Gastrointestinal: Soft and nontender. No distention.  Musculoskeletal: 2+ pitting edema in bilateral feet and one plus pitting edema and lower legs. No erythema or evidence of infection or wounds Neurologic:  Normal speech and language. No gross focal neurologic deficits are appreciated.  Skin:  Skin is warm, dry and intact. No rash noted. Psychiatric: Mood and affect are normal. Speech and behavior are normal.  ____________________________________________   LABS (all labs ordered are listed, but only abnormal results are displayed)  Labs Reviewed  CBC WITH DIFFERENTIAL/PLATELET - Abnormal; Notable for the following:       Result  Value   RDW 14.8 (*)    Neutro Abs 7.7 (*)    All other components within normal limits  COMPREHENSIVE METABOLIC PANEL - Abnormal; Notable for the following:    Potassium 2.9 (*)    CO2 21 (*)    Glucose, Bld 122 (*)    AST 11 (*)    ALT 11 (*)    All other components within normal limits  BRAIN NATRIURETIC PEPTIDE - Abnormal; Notable for the following:    B Natriuretic Peptide 157.0 (*)    All other components within normal limits  TROPONIN I   ____________________________________________  EKG  ED ECG REPORT I, Manjot Hinks, the attending physician, personally viewed and interpreted this ECG.  Date: 05/22/2016 EKG Time: 01:24 Rate: 92 Rhythm: sinus rhythm with sinus arrhythmia and occasional PVC QRS Axis: normal Intervals: normal ST/T Wave abnormalities: Non-specific ST segment / T-wave changes,  but no evidence of acute ischemia. Conduction Disturbances: none Narrative Interpretation: unremarkable  ____________________________________________  RADIOLOGY   Dg Chest 2 View  Result Date: 05/22/2016 CLINICAL DATA:  Bilateral pedal edema and dyspnea since last Sunday EXAM: CHEST  2 VIEW COMPARISON:  03/14/2016 FINDINGS: Stable cardiomegaly with tortuous thoracic aorta. Aortic atherosclerosis is noted. Mild central pulmonary vascular congestion is seen without effusion, pulmonary consolidation or pneumothorax. Bilateral chronic appearing rib fractures are present. Degenerative change in upper thoracic kyphosis is noted of the dorsal spine. IMPRESSION: Cardiomegaly with stable aortic atherosclerosis. Central vascular congestion consistent with mild CHF. Bilateral chronic rib fractures. Electronically Signed   By: Ashley Royalty M.D.   On: 05/22/2016 02:06    ____________________________________________   PROCEDURES  Critical Care performed: No   Procedure(s) performed:   Procedures   ____________________________________________   INITIAL IMPRESSION / ASSESSMENT AND  PLAN / ED COURSE  Pertinent labs & imaging results that were available during my care of the patient were reviewed by me and considered in my medical decision making (see chart for details).  the patient is generally well-appearing and in no acute distress. She has hypertension but no other abnormal vital signs. There is no evidence of DVT nor cellulitis. Her workup suggests a mild CHF exacerbation. Since she is in no acute distress and has no hypoxemia I think she can be managed as an outpatient. Due to multiple critical patients in the emergency department it did take me a while, but I looked into her EMR and try to touch base with cardiology. I had the opportunity eventually to speak with Dr. Saralyn Pilar and explained that the patient is hypokalemic at 2.9 and takes spironolactone so I do not want to give her Lasix. He recommended having her take one and a half tablets of her spironolactone (37.5 mg) daily and he felt my plan to have her follow up with the [facility] heart failure clinic was appropriate. I gave her a 40 mEq potassium supplement and give her prescription for 20 mEq by mouth twice daily for the next week. I explained the results to her, explain my conversation with cardiology and our recommendations, and gave her return precautions. She is comfortable with the plan and was able to ambulate out of the emergency department with any difficulty breathing her difficulty with ambulation.      ____________________________________________  FINAL CLINICAL IMPRESSION(S) / ED DIAGNOSES  Final diagnoses:  Peripheral edema  Acute on chronic congestive heart failure, unspecified heart failure type (HCC)  Hypokalemia     MEDICATIONS GIVEN DURING THIS VISIT:  Medications  spironolactone (ALDACTONE) tablet 37.5 mg (37.5 mg Oral Not Given 05/22/16 0750)  potassium chloride SA (K-DUR,KLOR-CON) CR tablet 40 mEq (40 mEq Oral Given 05/22/16 0641)     NEW OUTPATIENT MEDICATIONS STARTED DURING THIS  VISIT:  Discharge Medication List as of 05/22/2016  7:34 AM    START taking these medications   Details  potassium chloride SA (KLOR-CON M20) 20 MEQ tablet Take 1 tablet (20 mEq total) by mouth 2 (two) times daily., Starting Sat 05/22/2016, Print        Discharge Medication List as of 05/22/2016  7:34 AM      Discharge Medication List as of 05/22/2016  7:34 AM       Note:  This document was prepared using Dragon voice recognition software and may include unintentional dictation errors.    Hinda Kehr, MD 05/22/16 [phone]

## 2016-05-22 NOTE — Discharge Instructions (Signed)
As we discussed, you do have a little bit too much fluid at this time consistent with an acute CHF exacerbation.  Fortunately it does not require you to stay in the hospital at this time.  We spoke with a cardiologist who recommends that you take 1 1/2 pills of spironolactone (37.5 mg) daily from now on until you follow up in the [facility].  Also, because your potassium is low, we recommend you take the prescribed potassium supplement as instructed.  If you prefer to follow up with your regular cardiologist, please do so, but we recommend going to the [facility].  They will call you on Monday, but you can also call them first thing in the morning on Monday and let them know you were seen over the weekend in the Emergency Department and that you need a follow up appointment.  Please take your regular medications as recommended and follow-up with the doctor listed at the next available opportunity.  Return to the emergency department if you develop any new or worsening symptoms that concern you.

## 2016-05-22 NOTE — ED Notes (Addendum)
Pt was observed returning to [facility] [address] in her street clothes. Pt stated that the EDP told her "he was going to check her records" and be back to inform her on plan of care. Pt upset that EDP was "checking up on her records" and stated she was ready to leave; explained to patient that EDPs need to review pt's past medical records to fully understand the pt's presenting medical condition, co-morbidities, etc., and to possibly make sure that all information provided by pt was complete and not missing important information. Pt informed that EDP was not "checking her records" for any unprofessional reasons. Pt agrees to return to [facility] and continue with the plan of care determined best by the EDP.

## 2016-05-22 NOTE — ED Triage Notes (Signed)
Pt with bilateral foot edema, shortness of breath since last Sunday. Pt with 2+ pedal pulses and 3+ non pitting bilateral pedal edema. Pt complains of bilateral foot stinging. Pt appears in no acute shob while sitting in triage, resps unlabored.

## 2016-05-22 NOTE — ED Notes (Signed)
Pt ambulatory around lobby in no acute distress.  

## 2016-05-26 ENCOUNTER — Ambulatory Visit: Payer: Medicaid Other | Attending: Family | Admitting: Family

## 2016-05-26 ENCOUNTER — Telehealth: Payer: Self-pay

## 2016-05-26 ENCOUNTER — Encounter: Payer: Self-pay | Admitting: Family

## 2016-05-26 VITALS — BP 161/121 | HR 75 | Resp 20 | Ht 70.0 in | Wt 235.0 lb

## 2016-05-26 DIAGNOSIS — I10 Essential (primary) hypertension: Secondary | ICD-10-CM | POA: Insufficient documentation

## 2016-05-26 DIAGNOSIS — M109 Gout, unspecified: Secondary | ICD-10-CM | POA: Diagnosis not present

## 2016-05-26 DIAGNOSIS — Z87891 Personal history of nicotine dependence: Secondary | ICD-10-CM | POA: Insufficient documentation

## 2016-05-26 DIAGNOSIS — I110 Hypertensive heart disease with heart failure: Secondary | ICD-10-CM | POA: Insufficient documentation

## 2016-05-26 DIAGNOSIS — Z8673 Personal history of transient ischemic attack (TIA), and cerebral infarction without residual deficits: Secondary | ICD-10-CM | POA: Diagnosis not present

## 2016-05-26 DIAGNOSIS — G4733 Obstructive sleep apnea (adult) (pediatric): Secondary | ICD-10-CM | POA: Diagnosis not present

## 2016-05-26 DIAGNOSIS — I4891 Unspecified atrial fibrillation: Secondary | ICD-10-CM | POA: Insufficient documentation

## 2016-05-26 DIAGNOSIS — E876 Hypokalemia: Secondary | ICD-10-CM | POA: Insufficient documentation

## 2016-05-26 DIAGNOSIS — I5032 Chronic diastolic (congestive) heart failure: Secondary | ICD-10-CM | POA: Insufficient documentation

## 2016-05-26 DIAGNOSIS — Z86711 Personal history of pulmonary embolism: Secondary | ICD-10-CM | POA: Diagnosis not present

## 2016-05-26 DIAGNOSIS — J449 Chronic obstructive pulmonary disease, unspecified: Secondary | ICD-10-CM | POA: Insufficient documentation

## 2016-05-26 DIAGNOSIS — I509 Heart failure, unspecified: Secondary | ICD-10-CM

## 2016-05-26 LAB — BASIC METABOLIC PANEL
ANION GAP: 6 (ref 5–15)
BUN: 14 mg/dL (ref 6–20)
CALCIUM: 8.8 mg/dL — AB (ref 8.9–10.3)
CHLORIDE: 110 mmol/L (ref 101–111)
CO2: 27 mmol/L (ref 22–32)
Creatinine, Ser: 0.9 mg/dL (ref 0.44–1.00)
GFR calc Af Amer: >60 mL/min (ref >60)
GLUCOSE: 90 mg/dL (ref 65–99)
POTASSIUM: 3.7 mmol/L (ref 3.5–5.1)
Sodium: 143 mmol/L (ref 135–145)

## 2016-05-26 MED ORDER — FUROSEMIDE 40 MG PO TABS: 40.0000 mg | ORAL_TABLET | Freq: Every day | ORAL | 3 refills | Status: DC

## 2016-05-26 NOTE — Progress Notes (Signed)
HPI   [name] is a 51 y/o female with a history of pulmonary embolus, obstructive sleep apnea, atrial fibrillation, COPD, gout, HTN, CVA, remote tobacco use and chronic heart failure.   Has not had an echocardiogram done. Had a pharmacologic stress trest done 09/12/12 and had an estimated EF of 49%.   Was in the ED 05/22/16 with shortness of breath and leg swelling. Potassium level low at 2.9 and extra spironolactone given.  Medications were adjusted and she was discharged home. ED visit 05/07/16 due to gout flare. Treated and discharged home.  ED visit 03/14/16 for chest wall/back pain. Xrays were negative, treated and discharged home.   She presents today for her initial visit with a chief complaint of bilateral moderate lower leg edema. She says that it began about 1 month ago after she was treated for gout. Not really any improvement in the last month. Has associated fatigue, cough, chest tightness, palpitations and abdominal distention with this.   Past Medical History:  Diagnosis Date  . A-fib ([place]) 2010  . CHF (congestive heart failure) ([place])   . COPD (chronic obstructive pulmonary disease) ([place])   . Gout   . Hypertension   . Pulmonary embolus ([place])   . Stroke [facility])    Past Surgical History:  Procedure Laterality Date  . bilateral wrist fractures  2010  . lt ankle fracture  2002   No family history on file. Social History  Substance Use Topics  . Smoking status: Former Smoker    Types: Cigarettes  . Smokeless tobacco: Never Used  . Alcohol use Yes     Comment: occ   Allergies  Allergen Reactions  . Morphine And Related Hives  . Penicillins Other (See Comments)    Painful urination Has patient had a PCN reaction causing immediate rash, facial/tongue/throat swelling, SOB or lightheadedness with hypotension: yes Has patient had a PCN reaction causing severe rash involving mucus membranes or skin necrosis: no Has patient had a PCN reaction that required hospitalization  no Has patient had a PCN reaction occurring within the last 10 years: no If all of the above answers are "NO", then may proceed with Cephalosporin use.    Prior to Admission medications   Medication Sig Start Date End Date Taking? Authorizing Provider  amLODipine (NORVASC) 10 MG tablet Take 10 mg by mouth daily.   Yes Historical Provider, MD  aspirin EC 81 MG tablet Take 81 mg by mouth daily.   Yes Historical Provider, MD  atorvastatin (LIPITOR) 20 MG tablet Take 20 mg by mouth daily.   Yes Historical Provider, MD  cloNIDine (CATAPRES) 0.1 MG tablet Take 0.1 mg by mouth 2 (two) times daily.   Yes Historical Provider, MD  diphenhydrAMINE (BENADRYL) 25 MG tablet Take 25 mg by mouth every 6 (six) hours as needed.   Yes Historical Provider, MD  hydrALAZINE (APRESOLINE) 50 MG tablet Take 50 mg by mouth 3 (three) times daily.   Yes Historical Provider, MD  lisinopril (PRINIVIL,ZESTRIL) 40 MG tablet Take 40 mg by mouth daily.   Yes Historical Provider, MD  metoprolol succinate (TOPROL-XL) 100 MG 24 hr tablet Take 100 mg by mouth daily. Take with or immediately following a meal.   Yes Historical Provider, MD  ondansetron (ZOFRAN) 4 MG tablet Take 1 tablet (4 mg total) by mouth daily as needed for nausea or vomiting. 07/22/15  Yes Lavonia Drafts, MD  potassium chloride SA (KLOR-CON M20) 20 MEQ tablet Take 1 tablet (20 mEq total) by mouth  2 (two) times daily. 05/22/16  Yes Hinda Kehr, MD  sertraline (ZOLOFT) 100 MG tablet Take 100 mg by mouth daily.   Yes Historical Provider, MD  spironolactone (ALDACTONE) 25 MG tablet Take 37.5 mg by mouth daily.    Yes Historical Provider, MD  metoCLOPramide (REGLAN) 10 MG tablet Take 1 tablet (10 mg total) by mouth every 8 (eight) hours as needed for nausea. 04/01/15 03/31/16  Loney Hering, MD    Review of Systems  Constitutional: Positive for fatigue. Negative for appetite change.  HENT: Negative for congestion, postnasal drip and sore throat.   Eyes: Negative.    Respiratory: Positive for cough (white sputum), chest tightness and shortness of breath.   Cardiovascular: Positive for palpitations and leg swelling. Negative for chest pain.  Gastrointestinal: Positive for abdominal distention. Negative for abdominal pain.  Endocrine: Negative.   Genitourinary: Negative.   Musculoskeletal: Negative for back pain and neck pain.  Skin: Negative.   Allergic/Immunologic: Negative.   Neurological: Negative for dizziness and light-headedness.  Hematological: Negative for adenopathy. Does not bruise/bleed easily.  Psychiatric/Behavioral: Negative for dysphoric mood and sleep disturbance (wearing CPAP nightly; sleeping on 2 pillows). The patient is not nervous/anxious.    Vitals:   05/26/16 1231  BP: (!) 161/121  Pulse: 75  Resp: 20  SpO2: 99%  Weight: 235 lb (106.6 kg)  Height: 5\' 10"  (1.778 m)   Wt Readings from Last 3 Encounters:  05/26/16 235 lb (106.6 kg)  05/22/16 224 lb (101.6 kg)  05/07/16 224 lb (101.6 kg)   Lab Results  Component Value Date   CREATININE 0.71 05/22/2016   CREATININE 0.73 07/22/2015   CREATININE 0.73 06/17/2015    Physical Exam  Constitutional: She is oriented to person, place, and time. She appears well-developed and well-nourished.  HENT:  Head: Normocephalic and atraumatic.  Neck: Normal range of motion. Neck supple. No JVD present.  Cardiovascular: Normal rate and regular rhythm.   Pulmonary/Chest: Effort normal. She has no wheezes. She has no rales.  Abdominal: Soft. She exhibits distension. There is no tenderness.  Musculoskeletal: She exhibits edema (3+ pitting edema in bilateral lower legs with R>L). She exhibits no tenderness.  Neurological: She is alert and oriented to person, place, and time.  Skin: Skin is warm and dry.  Psychiatric: She has a normal mood and affect. Her behavior is normal. Thought content normal.  Nursing note and vitals reviewed.   Assessment & Plan:  1: Chronic heart failure with  preserved ejection fraction- - NYHA class III - moderately volume overloaded today - not weighing daily. She was instructed to begin weighing daily first thing in the morning and write the weight down. She is to call for an overnight weight gain of >2 pounds or a weekly weight gain of >5 pounds. - not adding salt to her food but admits to not reading food labels very closely. Discussed the importance of closely following a 2000mg  sodium diet and written dietary information was given to her about this. - echocardiogram ordered - right lower leg ultrasound ordered to rule out DVT per [facility] ED recommendation - currently taking spironolactone 37.5mg  daily  - will probably need additional diuretic but will get lab work back first - will need to schedule follow-up with cardiologist ([place])  2: HTN- - BP elevated although patient says that it looks good for her - discussed adding additional diuretic per above  3: Hypokalemia- - potassium in the ED was 2.9 on 05/22/16 - was given potassium in  the ED along with prescription for 30meq twice daily - also is now taking 37.5mg  spironolactone  Medication bottles were reviewed.  Return here next week for a recheck and will recheck lab work then as well.

## 2016-05-26 NOTE — Patient Instructions (Addendum)
Begin weighing daily and call for an overnight weight gain of > 2 pounds or a weekly weight gain of >5 pounds.  Elevate legs as much as possible.  Maintain fluid intake between 40-50 ounces daily.   Will call you tomorrow after getting lab results back.

## 2016-05-26 NOTE — Telephone Encounter (Signed)
Labs reviewed. Patient is to begin taking Lasix 40 mg daily. She is to continue taking spironolactone and potassium as directed.

## 2016-06-03 ENCOUNTER — Encounter: Payer: Self-pay | Admitting: Family

## 2016-06-03 ENCOUNTER — Ambulatory Visit: Payer: Medicaid Other | Attending: Family | Admitting: Family

## 2016-06-03 VITALS — BP 133/106 | HR 89 | Resp 20 | Ht 70.0 in | Wt 225.2 lb

## 2016-06-03 DIAGNOSIS — R079 Chest pain, unspecified: Secondary | ICD-10-CM | POA: Insufficient documentation

## 2016-06-03 DIAGNOSIS — Z87891 Personal history of nicotine dependence: Secondary | ICD-10-CM | POA: Insufficient documentation

## 2016-06-03 DIAGNOSIS — Z86711 Personal history of pulmonary embolism: Secondary | ICD-10-CM | POA: Diagnosis not present

## 2016-06-03 DIAGNOSIS — I10 Essential (primary) hypertension: Secondary | ICD-10-CM

## 2016-06-03 DIAGNOSIS — Z880 Allergy status to penicillin: Secondary | ICD-10-CM | POA: Diagnosis not present

## 2016-06-03 DIAGNOSIS — Z888 Allergy status to other drugs, medicaments and biological substances status: Secondary | ICD-10-CM | POA: Insufficient documentation

## 2016-06-03 DIAGNOSIS — J449 Chronic obstructive pulmonary disease, unspecified: Secondary | ICD-10-CM | POA: Insufficient documentation

## 2016-06-03 DIAGNOSIS — G4733 Obstructive sleep apnea (adult) (pediatric): Secondary | ICD-10-CM | POA: Diagnosis not present

## 2016-06-03 DIAGNOSIS — Z8673 Personal history of transient ischemic attack (TIA), and cerebral infarction without residual deficits: Secondary | ICD-10-CM | POA: Insufficient documentation

## 2016-06-03 DIAGNOSIS — Z9889 Other specified postprocedural states: Secondary | ICD-10-CM | POA: Diagnosis not present

## 2016-06-03 DIAGNOSIS — E876 Hypokalemia: Secondary | ICD-10-CM | POA: Diagnosis not present

## 2016-06-03 DIAGNOSIS — I110 Hypertensive heart disease with heart failure: Secondary | ICD-10-CM | POA: Insufficient documentation

## 2016-06-03 DIAGNOSIS — Z7982 Long term (current) use of aspirin: Secondary | ICD-10-CM | POA: Insufficient documentation

## 2016-06-03 DIAGNOSIS — I5032 Chronic diastolic (congestive) heart failure: Secondary | ICD-10-CM | POA: Insufficient documentation

## 2016-06-03 DIAGNOSIS — M109 Gout, unspecified: Secondary | ICD-10-CM | POA: Diagnosis not present

## 2016-06-03 DIAGNOSIS — I4891 Unspecified atrial fibrillation: Secondary | ICD-10-CM | POA: Diagnosis not present

## 2016-06-03 DIAGNOSIS — Z79899 Other long term (current) drug therapy: Secondary | ICD-10-CM | POA: Insufficient documentation

## 2016-06-03 LAB — BASIC METABOLIC PANEL
ANION GAP: 10 (ref 5–15)
BUN: 18 mg/dL (ref 6–20)
CALCIUM: 9.4 mg/dL (ref 8.9–10.3)
CO2: 29 mmol/L (ref 22–32)
Chloride: 102 mmol/L (ref 101–111)
Creatinine, Ser: 0.92 mg/dL (ref 0.44–1.00)
GFR calc Af Amer: >60 mL/min (ref >60)
GFR calc non Af Amer: >60 mL/min (ref >60)
GLUCOSE: 90 mg/dL (ref 65–99)
POTASSIUM: 3.4 mmol/L — AB (ref 3.5–5.1)
Sodium: 141 mmol/L (ref 135–145)

## 2016-06-03 MED ORDER — POTASSIUM CHLORIDE CRYS ER 20 MEQ PO TBCR: 20.0000 meq | EXTENDED_RELEASE_TABLET | Freq: Two times a day (BID) | ORAL | 5 refills | Status: DC

## 2016-06-03 NOTE — Progress Notes (Signed)
HPI   [name] is a 51 y/o female with a history of pulmonary embolus, obstructive sleep apnea, atrial fibrillation, COPD, gout, HTN, CVA, remote tobacco use and chronic heart failure.   Has not had an echocardiogram done. Had a pharmacologic stress trest done 09/12/12 and had an estimated EF of 49%.   Was in the ED 05/22/16 with shortness of breath and leg swelling. Potassium level low at 2.9 and extra spironolactone given.  Medications were adjusted and she was discharged home. ED visit 05/07/16 due to gout flare. Treated and discharged home.  ED visit 03/14/16 for chest wall/back pain. Xrays were negative, treated and discharged home.   She presents today for a follow-up visit with chest pain. She said that it occurred last night and quickly resolved on its own. This has been occurring intermittently for a few months now. She doesn't have any associated fatigue, shortness of breath, edema, palpitations, nausea or sweating.   Past Medical History:  Diagnosis Date  . A-fib ([place]) 2010  . CHF (congestive heart failure) ([place])   . COPD (chronic obstructive pulmonary disease) ([place])   . Gout   . Hypertension   . Pulmonary embolus ([place])   . Stroke [facility])    Past Surgical History:  Procedure Laterality Date  . bilateral wrist fractures  2010  . lt ankle fracture  2002   No family history on file. Social History  Substance Use Topics  . Smoking status: Former Smoker    Types: Cigarettes  . Smokeless tobacco: Never Used  . Alcohol use Yes     Comment: occ   Allergies  Allergen Reactions  . Morphine And Related Hives  . Penicillins Other (See Comments)    Painful urination Has patient had a PCN reaction causing immediate rash, facial/tongue/throat swelling, SOB or lightheadedness with hypotension: yes Has patient had a PCN reaction causing severe rash involving mucus membranes or skin necrosis: no Has patient had a PCN reaction that required hospitalization no Has patient had a PCN  reaction occurring within the last 10 years: no If all of the above answers are "NO", then may proceed with Cephalosporin use.    Prior to Admission medications   Medication Sig Start Date End Date Taking? Authorizing Provider  amLODipine (NORVASC) 10 MG tablet Take 10 mg by mouth daily.   Yes [provider]  aspirin EC 81 MG tablet Take 81 mg by mouth daily.   Yes [provider]  atorvastatin (LIPITOR) 20 MG tablet Take 20 mg by mouth daily.   Yes [provider]  cloNIDine (CATAPRES) 0.1 MG tablet Take 0.1 mg by mouth 2 (two) times daily.   Yes [provider]  diphenhydrAMINE (BENADRYL) 25 MG tablet Take 25 mg by mouth every 6 (six) hours as needed.   Yes [provider]  furosemide (LASIX) 40 MG tablet Take 1 tablet (40 mg total) by mouth daily. 05/26/16 08/24/16 Yes Hackney, Otila Kluver A, FNP  hydrALAZINE (APRESOLINE) 50 MG tablet Take 50 mg by mouth 3 (three) times daily.   Yes [provider]  lisinopril (PRINIVIL,ZESTRIL) 40 MG tablet Take 40 mg by mouth daily.   Yes [provider]  metoCLOPramide (REGLAN) 10 MG tablet Take 1 tablet (10 mg total) by mouth every 8 (eight) hours as needed for nausea. 04/01/15 06/03/16 Yes Loney Hering, MD  metoprolol succinate (TOPROL-XL) 100 MG 24 hr tablet Take 100 mg by mouth daily. Take with or immediately following a meal.   Yes [provider]  ondansetron (ZOFRAN) 4 MG tablet Take 1 tablet (4 mg total) by mouth daily as needed for nausea or vomiting. 07/22/15  Yes Lavonia Drafts, MD  potassium chloride SA (KLOR-CON M20) 20 MEQ tablet Take 1 tablet (20 mEq total) by mouth 2 (two) times daily. 06/03/16  Yes Hackney, Otila Kluver A, FNP  sertraline (ZOLOFT) 100 MG tablet Take 100 mg by mouth daily.   Yes [provider]  spironolactone (ALDACTONE) 25 MG tablet Take 37.5 mg by mouth daily.    Yes [provider]     Review of Systems  Constitutional: Negative for appetite  change and fatigue.  HENT: Negative for congestion, postnasal drip and sore throat.   Eyes: Negative.   Respiratory: Negative for cough, chest tightness and shortness of breath.   Cardiovascular: Positive for chest pain (last night; resolved quickly on own). Negative for palpitations and leg swelling.  Gastrointestinal: Negative for abdominal distention, abdominal pain and nausea.  Endocrine: Negative.   Genitourinary: Negative.   Musculoskeletal: Negative for back pain.  Skin: Negative.   Allergic/Immunologic: Negative.   Neurological: Negative for dizziness and light-headedness.  Hematological: Negative for adenopathy. Does not bruise/bleed easily.  Psychiatric/Behavioral: Negative for dysphoric mood and sleep disturbance (wearing CPAP nightly; sleeping on 2 pillows). The patient is not nervous/anxious.    Vitals:   06/03/16 1318  BP: (!) 133/106  Pulse: 89  Resp: 20  SpO2: 95%  Weight: 225 lb 4 oz (102.2 kg)  Height: 5\' 10"  (1.778 m)   Wt Readings from Last 3 Encounters:  06/03/16 225 lb 4 oz (102.2 kg)  05/26/16 235 lb (106.6 kg)  05/22/16 224 lb (101.6 kg)   Lab Results  Component Value Date   CREATININE 0.90 05/26/2016   CREATININE 0.71 05/22/2016   CREATININE 0.73 07/22/2015    Physical Exam  Constitutional: She is oriented to person, place, and time. She appears well-developed and well-nourished.  HENT:  Head: Normocephalic and atraumatic.  Neck: Normal range of motion. Neck supple. No JVD present.  Cardiovascular: Normal rate and regular rhythm.   Pulmonary/Chest: Effort normal. She has no wheezes. She has no rales.  Abdominal: Soft. She exhibits no distension. There is no tenderness.  Musculoskeletal: She exhibits no edema or tenderness.  Neurological: She is alert and oriented to person, place, and time.  Skin: Skin is warm and dry.  Psychiatric: She has a normal mood and affect. Her behavior is normal. Thought content normal.  Nursing note and vitals  reviewed.   Assessment & Plan:  1: Chronic heart failure with preserved ejection fraction- - NYHA class I - euvolemic today -  weighing daily. She is to call for an overnight weight gain of >2 pounds or a weekly weight gain of >5 pounds. Weight down 10 pounds by our scale from the last time she was here - not adding salt to her food but admits to not reading food labels very closely. Discussed the importance of closely following a 2000mg  sodium diet  - echocardiogram not done yet - right lower leg ultrasound not done yet either - currently taking spironolactone 37.5mg  daily  - taking furosemide 40mg  daily - will need to schedule follow-up with cardiologist ([facility])  2: HTN- - BP elevated although improved from last visit - now taking furosemide daily  3: Hypokalemia- - potassium in the ED was 2.9 on 05/22/16 - check a BMP today - continues to take 37.5mg  spironolactone along with potassium 59meq BID  Medication bottles were reviewed.  Return in 2 weeks for a recheck of her blood pressure.

## 2016-06-03 NOTE — Patient Instructions (Signed)
Continue weighing daily and call for an overnight weight gain of > 2 pounds or a weekly weight gain of >5 pounds. 

## 2016-06-04 ENCOUNTER — Telehealth: Payer: Self-pay | Admitting: Family

## 2016-06-04 NOTE — Telephone Encounter (Signed)
Spoke with patient regarding lab work that was done 06/03/16. Potassium level 3.4 and GFR >60.   She is currently taking spironolactone 37.5mg  along with furosemide 40mg  daily and potassium 3meq BID.   Advised patient to increase her spironolactone to 50mg  daily and will recheck her lab work at her next visit. Patient verbalized understanding.

## 2016-06-14 ENCOUNTER — Ambulatory Visit: Payer: Medicaid Other | Attending: Family | Admitting: Family

## 2016-06-14 ENCOUNTER — Encounter: Payer: Self-pay | Admitting: Family

## 2016-06-14 VITALS — BP 149/112 | HR 77 | Resp 20 | Ht 70.0 in | Wt 226.4 lb

## 2016-06-14 DIAGNOSIS — Z79899 Other long term (current) drug therapy: Secondary | ICD-10-CM | POA: Diagnosis not present

## 2016-06-14 DIAGNOSIS — I4891 Unspecified atrial fibrillation: Secondary | ICD-10-CM | POA: Insufficient documentation

## 2016-06-14 DIAGNOSIS — Z87891 Personal history of nicotine dependence: Secondary | ICD-10-CM | POA: Diagnosis not present

## 2016-06-14 DIAGNOSIS — Z7982 Long term (current) use of aspirin: Secondary | ICD-10-CM | POA: Diagnosis not present

## 2016-06-14 DIAGNOSIS — Z880 Allergy status to penicillin: Secondary | ICD-10-CM | POA: Insufficient documentation

## 2016-06-14 DIAGNOSIS — Z86711 Personal history of pulmonary embolism: Secondary | ICD-10-CM | POA: Diagnosis not present

## 2016-06-14 DIAGNOSIS — Z8673 Personal history of transient ischemic attack (TIA), and cerebral infarction without residual deficits: Secondary | ICD-10-CM | POA: Insufficient documentation

## 2016-06-14 DIAGNOSIS — I110 Hypertensive heart disease with heart failure: Secondary | ICD-10-CM | POA: Insufficient documentation

## 2016-06-14 DIAGNOSIS — M109 Gout, unspecified: Secondary | ICD-10-CM | POA: Diagnosis not present

## 2016-06-14 DIAGNOSIS — E876 Hypokalemia: Secondary | ICD-10-CM | POA: Insufficient documentation

## 2016-06-14 DIAGNOSIS — J449 Chronic obstructive pulmonary disease, unspecified: Secondary | ICD-10-CM | POA: Insufficient documentation

## 2016-06-14 DIAGNOSIS — G4733 Obstructive sleep apnea (adult) (pediatric): Secondary | ICD-10-CM | POA: Insufficient documentation

## 2016-06-14 DIAGNOSIS — I5032 Chronic diastolic (congestive) heart failure: Secondary | ICD-10-CM | POA: Insufficient documentation

## 2016-06-14 DIAGNOSIS — I10 Essential (primary) hypertension: Secondary | ICD-10-CM

## 2016-06-14 LAB — BASIC METABOLIC PANEL
ANION GAP: 8 (ref 5–15)
BUN: 19 mg/dL (ref 6–20)
CHLORIDE: 109 mmol/L (ref 101–111)
CO2: 27 mmol/L (ref 22–32)
Calcium: 9.3 mg/dL (ref 8.9–10.3)
Creatinine, Ser: 0.7 mg/dL (ref 0.44–1.00)
GFR calc non Af Amer: >60 mL/min (ref >60)
Glucose, Bld: 98 mg/dL (ref 65–99)
POTASSIUM: 3.8 mmol/L (ref 3.5–5.1)
Sodium: 144 mmol/L (ref 135–145)

## 2016-06-14 NOTE — Progress Notes (Signed)
HPI   Ms Cieslewicz is a 51 y/o female with a history of pulmonary embolus, obstructive sleep apnea, atrial fibrillation, COPD, gout, HTN, CVA, remote tobacco use and chronic heart failure.   Has not had an echocardiogram done. Had a pharmacologic stress trest done 09/12/12 and had an estimated EF of 49%.   Was in the ED 05/22/16 with shortness of breath and leg swelling. Potassium level low at 2.9 and extra spironolactone given.  Medications were adjusted and she was discharged home. ED visit 05/07/16 due to gout flare. Treated and discharged home.  ED visit 03/14/16 for chest wall/back pain. Xrays were negative, treated and discharged home.   She presents today with a chief complaint of a follow-up visit. She currently denies any fatigue, shortness of breath, chest pain, dizziness or edema. Weight has been stable at home and she reports sleeping well. Just took her medications right before coming to the office today.   Past Medical History:  Diagnosis Date  . A-fib ([place]) 2010  . CHF (congestive heart failure) ([place])   . COPD (chronic obstructive pulmonary disease) ([place])   . Gout   . Hypertension   . Pulmonary embolus ([place])   . Stroke [facility])    Past Surgical History:  Procedure Laterality Date  . bilateral wrist fractures  2010  . lt ankle fracture  2002   No family history on file. Social History  Substance Use Topics  . Smoking status: Former Smoker    Types: Cigarettes  . Smokeless tobacco: Never Used  . Alcohol use Yes     Comment: occ   Allergies  Allergen Reactions  . Morphine And Related Hives  . Penicillins Other (See Comments)    Painful urination Has patient had a PCN reaction causing immediate rash, facial/tongue/throat swelling, SOB or lightheadedness with hypotension: yes Has patient had a PCN reaction causing severe rash involving mucus membranes or skin necrosis: no Has patient had a PCN reaction that required hospitalization no Has patient had a PCN reaction occurring  within the last 10 years: no If all of the above answers are "NO", then may proceed with Cephalosporin use.     Prior to Admission medications   Medication Sig Start Date End Date Taking? Authorizing Provider  amLODipine (NORVASC) 10 MG tablet Take 10 mg by mouth daily.   Yes [provider]  aspirin EC 81 MG tablet Take 81 mg by mouth daily.   Yes [provider]  atorvastatin (LIPITOR) 20 MG tablet Take 20 mg by mouth daily.   Yes [provider]  cloNIDine (CATAPRES) 0.1 MG tablet Take 0.1 mg by mouth 2 (two) times daily.   Yes [provider]  diphenhydrAMINE (BENADRYL) 25 MG tablet Take 25 mg by mouth every 6 (six) hours as needed.   Yes [provider]  furosemide (LASIX) 40 MG tablet Take 1 tablet (40 mg total) by mouth daily. 05/26/16 08/24/16 Yes Tamerra Merkley, Otila Kluver A, FNP  hydrALAZINE (APRESOLINE) 50 MG tablet Take 50 mg by mouth 3 (three) times daily.   Yes [provider]  lisinopril (PRINIVIL,ZESTRIL) 40 MG tablet Take 40 mg by mouth daily.   Yes [provider]  metoCLOPramide (REGLAN) 10 MG tablet Take 1 tablet (10 mg total) by mouth every 8 (eight) hours as needed for nausea. 04/01/15 06/14/16 Yes Loney Hering, MD  metoprolol succinate (TOPROL-XL) 100 MG 24 hr tablet Take 100 mg by mouth daily. Take with or immediately following a meal.   Yes [provider]  ondansetron (ZOFRAN) 4 MG tablet Take 1 tablet (4 mg total) by mouth daily as needed for nausea or vomiting. 07/22/15  Yes Lavonia Drafts, MD  potassium chloride SA (KLOR-CON M20) 20 MEQ tablet Take 1 tablet (20 mEq total) by mouth 2 (two) times daily. 06/03/16  Yes Aashika Carta, Otila Kluver A, FNP  sertraline (ZOLOFT) 100 MG tablet Take 100 mg by mouth daily.   Yes [provider]  spironolactone (ALDACTONE) 25 MG tablet Take 50 mg by mouth daily.    Yes [provider]    Review of Systems  Constitutional: Negative for appetite change and fatigue.   HENT: Negative for congestion, postnasal drip and sore throat.   Eyes: Negative.   Respiratory: Negative for cough, chest tightness and shortness of breath.   Cardiovascular: Negative for chest pain, palpitations and leg swelling.  Gastrointestinal: Negative for abdominal distention, abdominal pain and nausea.  Endocrine: Negative.   Genitourinary: Negative.   Musculoskeletal: Negative for back pain and neck pain.  Skin: Negative.   Allergic/Immunologic: Negative.   Neurological: Negative for dizziness and light-headedness.  Hematological: Negative for adenopathy. Does not bruise/bleed easily.  Psychiatric/Behavioral: Negative for dysphoric mood and sleep disturbance (wearing CPAP nightly; sleeping on 2 pillows). The patient is not nervous/anxious.    Vitals:   06/14/16 1024  BP: (!) 149/112  Pulse: 77  Resp: 20  SpO2: 99%  Weight: 226 lb 6 oz (102.7 kg)  Height: 5\' 10"  (1.778 m)   Wt Readings from Last 3 Encounters:  06/14/16 226 lb 6 oz (102.7 kg)  06/03/16 225 lb 4 oz (102.2 kg)  05/26/16 235 lb (106.6 kg)   Lab Results  Component Value Date   CREATININE 0.92 06/03/2016   CREATININE 0.90 05/26/2016   CREATININE 0.71 05/22/2016    Physical Exam  Constitutional: She is oriented to person, place, and time. She appears well-developed and well-nourished.  HENT:  Head: Normocephalic and atraumatic.  Neck: Normal range of motion. Neck supple. No JVD present.  Cardiovascular: Normal rate and regular rhythm.   Pulmonary/Chest: Effort normal. She has no wheezes. She has no rales.  Abdominal: Soft. She exhibits no distension. There is no tenderness.  Musculoskeletal: She exhibits no edema or tenderness.  Neurological: She is alert and oriented to person, place, and time.  Skin: Skin is warm and dry.  Psychiatric: She has a normal mood and affect. Her behavior is normal. Thought content normal.  Nursing note and vitals reviewed.   Assessment & Plan:  1: Chronic heart  failure with preserved ejection fraction- - NYHA class I - euvolemic today -  weighing daily. She is to call for an overnight weight gain of >2 pounds or a weekly weight gain of >5 pounds. Weight stable by our scale from the last time she was here - not adding salt to her food. Discussed the importance of closely following a 2000mg  sodium diet  - echocardiogram not done yet; CMA will call scheduling - right lower leg ultrasound not done yet either; no need to pursue this as the edema is completely gone - taking furosemide 40mg  daily - will need to schedule follow-up with cardiologist ([facility])  2: HTN- - BP elevated although slightly improved with manual recheck - now taking furosemide daily; may need to increase diuretic to BID to aid with blood pressure - checking a BMP today  3: Hypokalemia- - reviewed BMP done 06/03/16; potassium 3.4 and GFR >60 - continues taking 50mg  spironolactone daily along with potassium 40meq  BID  Medication bottles were reviewed.  Return in 1 month for a recheck of her blood pressure.

## 2016-06-14 NOTE — Patient Instructions (Signed)
Continue weighing daily and call for an overnight weight gain of > 2 pounds or a weekly weight gain of >5 pounds. 

## 2016-06-15 ENCOUNTER — Telehealth: Payer: Self-pay | Admitting: Family

## 2016-06-15 MED ORDER — FUROSEMIDE 40 MG PO TABS: 40.0000 mg | ORAL_TABLET | Freq: Two times a day (BID) | ORAL | 3 refills | Status: DC

## 2016-06-15 NOTE — Telephone Encounter (Signed)
Reviewed lab results from 06/14/16. Potassium and renal function are stable.  Due to continued HTN, will increase her furosemide to 40mg  twice daily and patient verbalizes understanding of change. Will check a BMP at her next visit and send a new prescription to her pharmacy.  Also advised her of her echocardiogram scheduled for 06/17/16 at 10:00 am and an appointment with Dr. Saralyn Pilar on 06/18/16 at 11:00am.

## 2016-06-17 ENCOUNTER — Ambulatory Visit
Admission: RE | Admit: 2016-06-17 | Discharge: 2016-06-17 | Disposition: A | Discharge status: Home / Self Care | Payer: Medicaid Other | Source: Ambulatory Visit | Attending: Family | Admitting: Family

## 2016-06-17 DIAGNOSIS — I509 Heart failure, unspecified: Secondary | ICD-10-CM | POA: Diagnosis present

## 2016-06-17 NOTE — Progress Notes (Signed)
*  PRELIMINARY RESULTS* Echocardiogram 2D Echocardiogram has been performed.  [name] 06/17/2016, 11:24 AM

## 2016-06-25 ENCOUNTER — Other Ambulatory Visit: Payer: Self-pay | Admitting: Cardiology

## 2016-06-25 DIAGNOSIS — I509 Heart failure, unspecified: Secondary | ICD-10-CM

## 2016-06-25 DIAGNOSIS — R079 Chest pain, unspecified: Secondary | ICD-10-CM

## 2016-07-01 ENCOUNTER — Ambulatory Visit
Admission: RE | Admit: 2016-07-01 | Discharge: 2016-07-01 | Disposition: A | Discharge status: Home / Self Care | Payer: Medicaid Other | Source: Ambulatory Visit | Attending: Cardiology | Admitting: Cardiology

## 2016-07-05 ENCOUNTER — Telehealth: Payer: Self-pay | Admitting: Family

## 2016-07-05 ENCOUNTER — Ambulatory Visit: Payer: Medicaid Other | Admitting: Family

## 2016-07-05 NOTE — Telephone Encounter (Signed)
Patient did not show for her [facility] appointment on 07/05/16. Will attempt to reschedule.

## 2016-11-26 ENCOUNTER — Other Ambulatory Visit: Payer: Self-pay | Admitting: Emergency Medicine

## 2016-12-30 ENCOUNTER — Emergency Department
Admission: EM | Admit: 2016-12-30 | Discharge: 2016-12-30 | Disposition: A | Discharge status: Home / Self Care | Payer: Medicaid Other | Attending: Emergency Medicine | Admitting: Emergency Medicine

## 2016-12-30 ENCOUNTER — Emergency Department: Payer: Medicaid Other

## 2016-12-30 ENCOUNTER — Encounter: Payer: Self-pay | Admitting: Emergency Medicine

## 2016-12-30 ENCOUNTER — Other Ambulatory Visit: Payer: Self-pay

## 2016-12-30 DIAGNOSIS — Z8673 Personal history of transient ischemic attack (TIA), and cerebral infarction without residual deficits: Secondary | ICD-10-CM | POA: Insufficient documentation

## 2016-12-30 DIAGNOSIS — Z79899 Other long term (current) drug therapy: Secondary | ICD-10-CM | POA: Diagnosis not present

## 2016-12-30 DIAGNOSIS — I5032 Chronic diastolic (congestive) heart failure: Secondary | ICD-10-CM | POA: Insufficient documentation

## 2016-12-30 DIAGNOSIS — I110 Hypertensive heart disease with heart failure: Secondary | ICD-10-CM | POA: Diagnosis not present

## 2016-12-30 DIAGNOSIS — Z87891 Personal history of nicotine dependence: Secondary | ICD-10-CM | POA: Diagnosis not present

## 2016-12-30 DIAGNOSIS — Z7982 Long term (current) use of aspirin: Secondary | ICD-10-CM | POA: Insufficient documentation

## 2016-12-30 DIAGNOSIS — L03115 Cellulitis of right lower limb: Secondary | ICD-10-CM | POA: Insufficient documentation

## 2016-12-30 DIAGNOSIS — Z86711 Personal history of pulmonary embolism: Secondary | ICD-10-CM | POA: Insufficient documentation

## 2016-12-30 DIAGNOSIS — L089 Local infection of the skin and subcutaneous tissue, unspecified: Secondary | ICD-10-CM | POA: Diagnosis present

## 2016-12-30 LAB — CBC WITH DIFFERENTIAL/PLATELET
BASOS ABS: 0 10*3/uL (ref 0–0.1)
BASOS PCT: 1 %
Eosinophils Absolute: 0.1 10*3/uL (ref 0–0.7)
Eosinophils Relative: 1 %
HEMATOCRIT: 42 % (ref 35.0–47.0)
HEMOGLOBIN: 13.5 g/dL (ref 12.0–16.0)
Lymphocytes Relative: 20 %
Lymphs Abs: 1.5 10*3/uL (ref 1.0–3.6)
MCH: 29 pg (ref 26.0–34.0)
MCHC: 32.1 g/dL (ref 32.0–36.0)
MCV: 90.3 fL (ref 80.0–100.0)
MONO ABS: 0.7 10*3/uL (ref 0.2–0.9)
Monocytes Relative: 9 %
NEUTROS ABS: 5.1 10*3/uL (ref 1.4–6.5)
NEUTROS PCT: 69 %
Platelets: 254 10*3/uL (ref 150–440)
RBC: 4.65 MIL/uL (ref 3.80–5.20)
RDW: 15.7 % — AB (ref 11.5–14.5)
WBC: 7.3 10*3/uL (ref 3.6–11.0)

## 2016-12-30 LAB — COMPREHENSIVE METABOLIC PANEL
ALBUMIN: 3.8 g/dL (ref 3.5–5.0)
ALT: 13 U/L — ABNORMAL LOW (ref 14–54)
AST: 20 U/L (ref 15–41)
Alkaline Phosphatase: 59 U/L (ref 38–126)
Anion gap: 11 (ref 5–15)
BILIRUBIN TOTAL: 0.3 mg/dL (ref 0.3–1.2)
BUN: 19 mg/dL (ref 6–20)
CO2: 21 mmol/L — AB (ref 22–32)
Calcium: 9.5 mg/dL (ref 8.9–10.3)
Chloride: 110 mmol/L (ref 101–111)
Creatinine, Ser: 1.02 mg/dL — ABNORMAL HIGH (ref 0.44–1.00)
GFR calc Af Amer: >60 mL/min (ref >60)
GFR calc non Af Amer: >60 mL/min (ref >60)
GLUCOSE: 99 mg/dL (ref 65–99)
POTASSIUM: 4.8 mmol/L (ref 3.5–5.1)
Sodium: 142 mmol/L (ref 135–145)
TOTAL PROTEIN: 7.3 g/dL (ref 6.5–8.1)

## 2016-12-30 LAB — APTT: aPTT: 25 seconds (ref 24–36)

## 2016-12-30 LAB — PROTIME-INR
INR: 0.89
Prothrombin Time: 12 seconds (ref 11.4–15.2)

## 2016-12-30 MED ORDER — CLINDAMYCIN HCL 300 MG PO CAPS: 300.0000 mg | ORAL_CAPSULE | Freq: Three times a day (TID) | ORAL | 0 refills | Status: AC

## 2016-12-30 NOTE — ED Provider Notes (Addendum)
[facility] Emergency Department Provider Note  ____________________________________________   I have reviewed the triage vital signs and the nursing notes.   HISTORY  Chief Complaint Insect Bite    HPI [name] is a 51 y.o. female who states that she has had a area about the size of a nickel on her leg for the last 5 weeks or so.  It began as a red spot and has progressed to a dark necrotic area but has been a dark necrotic area for about a month.  She went to [facility] approximately 2 weeks ago on the 24th of last month at which time she was placed on Bactrim.  She has not followed up in the meantime.  The patient has been somewhat spotty in her compliance with the Bactrim, she still has 4 or 5 pills left.  She states is been no real change in the area, she has not had any fever or chills that has been no trauma or fall there was no witnessed insect bite, however she is concerned that it is not improving.  She denies any fever or systemic issues.    Past Medical History:  Diagnosis Date  . A-fib ([place]) 2010  . CHF (congestive heart failure) ([place])   . COPD (chronic obstructive pulmonary disease) ([place])   . Gout   . Hypertension   . Pulmonary embolus ([place])   . Stroke [facility])     Patient Active Problem List   Diagnosis Date Noted  . HTN (hypertension) 05/26/2016  . Chronic diastolic heart failure ([place]) 05/26/2016  . Hypokalemia 05/26/2016    Past Surgical History:  Procedure Laterality Date  . bilateral wrist fractures  2010  . lt ankle fracture  2002    Prior to Admission medications   Medication Sig Start Date End Date Taking? Authorizing Provider  amLODipine (NORVASC) 10 MG tablet Take 10 mg by mouth daily.    [provider]  aspirin EC 81 MG tablet Take 81 mg by mouth daily.    [provider]  atorvastatin (LIPITOR) 20 MG tablet Take 20 mg by mouth daily.    [provider]  cloNIDine (CATAPRES) 0.1 MG tablet Take 0.1  mg by mouth 2 (two) times daily.    [provider]  diphenhydrAMINE (BENADRYL) 25 MG tablet Take 25 mg by mouth every 6 (six) hours as needed.    [provider]  furosemide (LASIX) 40 MG tablet Take 1 tablet (40 mg total) by mouth 2 (two) times daily. 06/15/16 09/13/16  Alisa Graff, FNP  hydrALAZINE (APRESOLINE) 50 MG tablet Take 50 mg by mouth 3 (three) times daily.    [provider]  lisinopril (PRINIVIL,ZESTRIL) 40 MG tablet Take 40 mg by mouth daily.    [provider]  metoCLOPramide (REGLAN) 10 MG tablet Take 1 tablet (10 mg total) by mouth every 8 (eight) hours as needed for nausea. 04/01/15 06/14/16  Loney Hering, MD  metoprolol succinate (TOPROL-XL) 100 MG 24 hr tablet Take 100 mg by mouth daily. Take with or immediately following a meal.    [provider]  ondansetron (ZOFRAN) 4 MG tablet Take 1 tablet (4 mg total) by mouth daily as needed for nausea or vomiting. 07/22/15   Lavonia Drafts, MD  potassium chloride SA (KLOR-CON M20) 20 MEQ tablet Take 1 tablet (20 mEq total) by mouth 2 (two) times daily. 06/03/16   Alisa Graff, FNP  sertraline (ZOLOFT) 100 MG tablet Take 100 mg by  mouth daily.    [provider]  spironolactone (ALDACTONE) 25 MG tablet Take 50 mg by mouth daily.     [provider]    Allergies Morphine and related and Penicillins  Family History  Problem Relation Age of Onset  . Prostate cancer Father   . Rectal cancer Sister   . Breast cancer Sister     Social History Social History   Tobacco Use  . Smoking status: Former Smoker    Types: Cigarettes    Last attempt to quit: 01/26/2016    Years since quitting: 0.9  . Smokeless tobacco: Never Used  Substance Use Topics  . Alcohol use: Yes    Comment: occ  . Drug use: No    Review of Systems Constitutional: No fever/chills Eyes: No visual changes. ENT: No sore throat. No stiff neck no neck pain Cardiovascular: Denies chest  pain. Respiratory: Denies shortness of breath. Gastrointestinal:   no vomiting.  No diarrhea.  No constipation. Genitourinary: Negative for dysuria. Musculoskeletal: Negative lower extremity swelling Skin: Negative for rash. Neurological: Negative for severe headaches, focal weakness or numbness.   ____________________________________________   PHYSICAL EXAM:  VITAL SIGNS: ED Triage Vitals  Enc Vitals Group     BP 12/30/16 0948 (!) 154/82     Pulse Rate 12/30/16 0948 76     Resp 12/30/16 0948 18     Temp 12/30/16 0948 98 F (36.7 C)     Temp Source 12/30/16 0948 Oral     SpO2 12/30/16 0948 95 %     Weight 12/30/16 0949 225 lb (102.1 kg)     Height 12/30/16 0949 5\' 10"  (1.778 m)     Head Circumference --      Peak Flow --      Pain Score 12/30/16 0951 8     Pain Loc --      Pain Edu? --      Excl. in [place]? --     Constitutional: Alert and oriented. Well appearing and in no acute distress  Eyes: Conjunctivae are normal Head: Atraumatic HEENT: No congestion/rhinnorhea. Mucous membranes are moist.  Oropharynx non-erythematous Neck:   Nontender with no meningismus, no masses, no stridor Cardiovascular: Normal rate, regular rhythm. Grossly normal heart sounds.  Good peripheral circulation. Respiratory: Normal respiratory effort.  No retractions. Lungs CTAB. Abdominal: Soft and nontender. No distention. No guarding no rebound Back:  There is no focal tenderness or step off.  there is no midline tenderness there are no lesions noted. there is no CVA tenderness Musculoskeletal: No lower extremity tenderness, no upper extremity tenderness. No joint effusions, no DVT signs strong distal pulses no edema Neurologic:  Normal speech and language. No gross focal neurologic deficits are appreciated.  Skin:  There is an area to the right lower extremity, in the distal calf, it is very slightly tender and very slightly erythematous but it is necrotic, no smell to it however no crepitus it is  dry, there is an area that was marked apparently when she went to the doctor last time and that area is far outside the area of erythema at this time.  There is no induration or fluctuance.  There is no obvious abscess.  The erythema is very small and limited to circumference of the small eschar.  Psychiatric: Mood and affect are very anxious. Speech and behavior are normal.  ____________________________________________   LABS (all labs ordered are listed, but only abnormal results are displayed)  Labs Reviewed  CBC WITH DIFFERENTIAL/PLATELET -  Abnormal; Notable for the following components:      Result Value   RDW 15.7 (*)    All other components within normal limits  COMPREHENSIVE METABOLIC PANEL - Abnormal; Notable for the following components:   CO2 21 (*)    Creatinine, Ser 1.02 (*)    ALT 13 (*)    All other components within normal limits  PROTIME-INR  APTT    Pertinent labs  results that were available during my care of the patient were reviewed by me and considered in my medical decision making (see chart for details). ____________________________________________  EKG  I personally interpreted any EKGs ordered by me or triage Sinus rhythm at 64 bpm, no acute ST elevation or depression normal axis, nonspecific ST changes ____________________________________________  RADIOLOGY  Pertinent labs & imaging results that were available during my care of the patient were reviewed by me and considered in my medical decision making (see chart for details). If possible, patient and/or family made aware of any abnormal findings.  Dg Tibia/fibula Right  Result Date: 12/30/2016 CLINICAL DATA:  A month history of a wound over the medial lower right tibia and fibula. Symptoms have worsened despite treatment. EXAM: RIGHT TIBIA AND FIBULA - 2 VIEW COMPARISON:  None in PACs FINDINGS: There is lucency within the soft tissues over the medial third of the right leg. There are punctate  radiodensities within it. No abnormality of the adjacent tibia is observed. Elsewhere the tibia and fibula exhibit no acute abnormalities. There are mild degenerative changes about the knee. IMPRESSION: Lucency within the soft tissues of the medial aspect of the lower leg consistent with cellulitis. No evidence of osteomyelitis. Electronically Signed   By: David  Martinique M.D.   On: 12/30/2016 11:27   ____________________________________________    PROCEDURES  Procedure(s) performed: None  Procedures  Critical Care performed: None  ____________________________________________   INITIAL IMPRESSION / ASSESSMENT AND PLAN / ED COURSE  Pertinent labs & imaging results that were available during my care of the patient were reviewed by me and considered in my medical decision making (see chart for details).  Patient with a very small necrotic area of the leg unclear exactly what this is from does not appear to be advancing despite a month of symptoms, I do not think emergent hospitalization therefore is indicated.  Patient states she is taking Bactrim although it is clear from what is leftover that she has not been completely compliant with it.  Certainly no evidence of systemic illness no evidence of acute ischemia foot is warm well perfused with very strong pulses, no evidence of foreign body on x-ray no evidence of osteomyelitis.  Patient will need wound care follow-up.  I think I will change her antibiotics to clindamycin to the extent that there may be some degree of infection here otherwise, patient will need close outpatient follow-up and wound care we are try to see if we can get the wound care clinic on the phone to help [place].   ----------------------------------------- 12:25 PM on 12/30/2016 -----------------------------------------  Discussed with Dr. Con Memos of the wound care clinic who agrees with management clindamycin and close outpatient follow-up which they will help arrange, I also  advised the patient of this. Dr. Con Memos is confident that they can perform any further tests as needed and given chronicity of complaint he agrees w/ d.c.  I will also discuss with vascular surgery prior to discharge.  ----------------------------------------- 12:35 PM on 12/30/2016 -----------------------------------------  This with Dr. Lucky Cowboy of vascular surgery,  he does not wish ABIs are any further testing done here he will see the patient as an outpatient and agrees with management and discharge.   ____________________________________________   FINAL CLINICAL IMPRESSION(S) / ED DIAGNOSES  Final diagnoses:  None      This chart was dictated using voice recognition software.  Despite best efforts to proofread,  errors can occur which can change meaning.      Schuyler Amor, MD 12/30/16 1205    Schuyler Amor, MD 12/30/16 1235

## 2016-12-30 NOTE — Discharge Instructions (Signed)
Return to the emergency room for any new or worrisome symptoms including spread of redness, spread of blackness, numbness, weakness, fever, systemic illness of any other variety, or any other new or worrisome symptoms.

## 2016-12-30 NOTE — ED Notes (Signed)
Pt resting in bed, family at bedside, awake and alert, given TV remote, denies any needs

## 2016-12-30 NOTE — ED Triage Notes (Signed)
Pt reports insect bite about a month ago, states she was seen at [facility] on 11/24 and given atbx Bactrim which she is still taking. Pt states the redness is on her great toe on the right side as well as quarter size area of necrotic-looking skin with redness surrounding the wound.

## 2016-12-31 ENCOUNTER — Encounter (INDEPENDENT_AMBULATORY_CARE_PROVIDER_SITE_OTHER): Payer: Self-pay | Admitting: Vascular Surgery

## 2016-12-31 ENCOUNTER — Ambulatory Visit (INDEPENDENT_AMBULATORY_CARE_PROVIDER_SITE_OTHER): Payer: Medicaid Other | Admitting: Vascular Surgery

## 2016-12-31 VITALS — BP 144/106 | HR 76 | Resp 17 | Ht 70.0 in | Wt 222.0 lb

## 2016-12-31 DIAGNOSIS — L03115 Cellulitis of right lower limb: Secondary | ICD-10-CM

## 2016-12-31 DIAGNOSIS — I10 Essential (primary) hypertension: Secondary | ICD-10-CM

## 2016-12-31 DIAGNOSIS — E785 Hyperlipidemia, unspecified: Secondary | ICD-10-CM | POA: Diagnosis not present

## 2016-12-31 DIAGNOSIS — W57XXXA Bitten or stung by nonvenomous insect and other nonvenomous arthropods, initial encounter: Secondary | ICD-10-CM | POA: Diagnosis not present

## 2016-12-31 MED ORDER — SILVER SULFADIAZINE 1 % EX CREA: 1.0000 "application " | TOPICAL_CREAM | Freq: Every day | CUTANEOUS | 0 refills | Status: DC

## 2016-12-31 NOTE — Progress Notes (Signed)
Subjective:    Patient ID: [name], female    DOB: 1965/09/13, 51 y.o.   MRN: [id number] Chief Complaint  Patient presents with  . New Patient (Initial Visit)    celluitis post bug bite   Presents as a new patient referred to our office by the emergency room with a chief complaint of a right lower extremity bug bite.  The patient endorses a history of a possible spider bite approximately 2 weeks ago.  The patient was initially treated with Bactrim however it is not known how compliant she was with taking the antibiotics.  The patient was seen in the emergency department yesterday December 30, 2016 and her antibiotics was changed to clindamycin.  Both vascular surgery and Dr. Con Memos at the wound clinic were consulted.  The patient has not made an appointment to see Dr. Con Memos for wound care.  The patient notes her bug bite has not healed.  The area is now larger with what looks like some necrotic tissue in it.  There is some surrounding erythema.  The patient denies any drainage.  The patient denies any fever, nausea or vomiting.  Of note the patient does experience some pain with ambulation.  The patient states bilateral calf cramping with activity.  The patient denies any rest pain or other ulcer development aside from her bug bite.    Review of Systems  Constitutional: Negative.   HENT: Negative.   Eyes: Negative.   Respiratory: Negative.   Cardiovascular: Negative.   Gastrointestinal: Negative.   Endocrine: Negative.   Genitourinary: Negative.   Musculoskeletal: Negative.   Skin: Positive for wound.  Allergic/Immunologic: Negative.   Neurological: Negative.   Hematological: Negative.   Psychiatric/Behavioral: Negative.       Objective:   Physical Exam  Constitutional: She is oriented to person, place, and time. She appears well-developed and well-nourished. No distress.  HENT:  Head: Normocephalic and atraumatic.  Eyes: Conjunctivae are normal. Pupils are equal, round, and  reactive to light.  Neck: Normal range of motion.  Cardiovascular: Normal rate, regular rhythm, normal heart sounds and intact distal pulses.  Pulses:      Radial pulses are 2+ on the right side, and 2+ on the left side.       Dorsalis pedis pulses are 1+ on the right side, and 1+ on the left side.       Posterior tibial pulses are 1+ on the right side, and 1+ on the left side.  Medial right shin: 3 cm x 3 cm shallow circular ulceration noted.  Outer aspect of the ulceration with some necrotic tissue.  No active cellulitis noted.  No drainage noted.  Minimal edema to the right lower extremity.  The patient's calf is soft.  Pulmonary/Chest: Effort normal and breath sounds normal.  Musculoskeletal: Normal range of motion. She exhibits edema (Mild bilateral lower extremity edema noted).  Neurological: She is alert and oriented to person, place, and time.  Skin: She is not diaphoretic.  Psychiatric: She has a normal mood and affect. Her behavior is normal. Judgment and thought content normal.  Vitals reviewed.  BP (!) 144/106 (BP Location: Right Arm)   Pulse 76   Resp 17   Ht 5\' 10"  (1.778 m)   Wt 222 lb (100.7 kg)   BMI 31.85 kg/m   Past Medical History:  Diagnosis Date  . A-fib ([place]) 2010  . CHF (congestive heart failure) ([place])   . COPD (chronic obstructive pulmonary disease) ([place])   .  Gout   . Hypertension   . Pulmonary embolus ([place])   . Stroke [facility])    Social History   Socioeconomic History  . Marital status: Legally Separated    Spouse name: Not on file  . Number of children: Not on file  . Years of education: Not on file  . Highest education level: Not on file  Social Needs  . Financial resource strain: Not on file  . Food insecurity - worry: Not on file  . Food insecurity - inability: Not on file  . Transportation needs - medical: Not on file  . Transportation needs - non-medical: Not on file  Occupational History  . Not on file  Tobacco Use  . Smoking status:  Former Smoker    Types: Cigarettes    Last attempt to quit: 01/26/2016    Years since quitting: 0.9  . Smokeless tobacco: Never Used  Substance and Sexual Activity  . Alcohol use: Yes    Comment: occ  . Drug use: No  . Sexual activity: Not on file  Other Topics Concern  . Not on file  Social History Narrative  . Not on file   Past Surgical History:  Procedure Laterality Date  . bilateral wrist fractures  2010  . lt ankle fracture  2002   Family History  Problem Relation Age of Onset  . Prostate cancer Father   . Rectal cancer Sister   . Breast cancer Sister    Allergies  Allergen Reactions  . Morphine And Related Hives  . Penicillins Other (See Comments)    Painful urination Has patient had a PCN reaction causing immediate rash, facial/tongue/throat swelling, SOB or lightheadedness with hypotension: yes Has patient had a PCN reaction causing severe rash involving mucus membranes or skin necrosis: no Has patient had a PCN reaction that required hospitalization no Has patient had a PCN reaction occurring within the last 10 years: no If all of the above answers are "NO", then may proceed with Cephalosporin use.       Assessment & Plan:  Presents as a new patient referred to our office by the emergency room with a chief complaint of a right lower extremity bug bite.  The patient endorses a history of a possible spider bite approximately 2 weeks ago.  The patient was initially treated with Bactrim however it is not known how compliant she was with taking the antibiotics.  The patient was seen in the emergency department yesterday December 30, 2016 and her antibiotics was changed to clindamycin.  Both vascular surgery and Dr. Con Memos at the wound clinic were consulted.  The patient has not made an appointment to see Dr. Con Memos for wound care.  The patient notes her bug bite has not healed.  The area is now larger with what looks like some necrotic tissue in it.  There is some  surrounding erythema.  The patient denies any drainage.  The patient denies any fever, nausea or vomiting.  Of note the patient does experience some pain with ambulation.  The patient states bilateral calf cramping with activity.  The patient denies any rest pain or other ulcer development aside from her bug bite.   1. Bug bite, initial encounter - New Patient with possible spider bite approximately 2 weeks ago The patient was seen in [facility] ED initially prescribed Bactrim however it was transitioned to clindamycin yesterday. I will order an ABI to assess the patient's arterial status. The patient describes some claudication-like symptoms as  well. There is no cellulitis or infection noted to the ulceration I prescribed some Silvadene 1% cream to apply to the wound daily The patient was never given an appointment with Dr. Con Memos at the wound center so I have placed a referral for wound care The patient is to follow-up with me after the ABI to discuss the results.  - VAS [place] ABI WITH/WO TBI; Future - Ambulatory referral to Wound Clinic  2. Hyperlipidemia, unspecified hyperlipidemia type - Stable Encouraged good control as its slows the progression of atherosclerotic disease  3. Essential hypertension - Stable Encouraged good control as its slows the progression of atherosclerotic disease  Current Outpatient Medications on File Prior to Visit  Medication Sig Dispense Refill  . amLODipine (NORVASC) 10 MG tablet Take 10 mg by mouth daily.    Marland Kitchen aspirin EC 81 MG tablet Take 81 mg by mouth daily.    Marland Kitchen atorvastatin (LIPITOR) 20 MG tablet Take 20 mg by mouth daily.    . clindamycin (CLEOCIN) 300 MG capsule Take 1 capsule (300 mg total) by mouth 3 (three) times daily for 10 days. 30 capsule 0  . cloNIDine (CATAPRES) 0.1 MG tablet Take 0.1 mg by mouth 2 (two) times daily.    . diphenhydrAMINE (BENADRYL) 25 MG tablet Take 25 mg by mouth every 6 (six) hours as needed.    .  hydrALAZINE (APRESOLINE) 50 MG tablet Take 50 mg by mouth 3 (three) times daily.    Marland Kitchen lisinopril (PRINIVIL,ZESTRIL) 40 MG tablet Take 40 mg by mouth daily.    . metoprolol succinate (TOPROL-XL) 100 MG 24 hr tablet Take 100 mg by mouth daily. Take with or immediately following a meal.    . ondansetron (ZOFRAN) 4 MG tablet Take 1 tablet (4 mg total) by mouth daily as needed for nausea or vomiting. 20 tablet 1  . potassium chloride SA (KLOR-CON M20) 20 MEQ tablet Take 1 tablet (20 mEq total) by mouth 2 (two) times daily. 60 tablet 5  . sertraline (ZOLOFT) 100 MG tablet Take 100 mg by mouth daily.    Marland Kitchen spironolactone (ALDACTONE) 25 MG tablet Take 50 mg by mouth daily.     . furosemide (LASIX) 40 MG tablet Take 1 tablet (40 mg total) by mouth 2 (two) times daily. 180 tablet 3  . metoCLOPramide (REGLAN) 10 MG tablet Take 1 tablet (10 mg total) by mouth every 8 (eight) hours as needed for nausea. 20 tablet 0   No current facility-administered medications on file prior to visit.    There are no Patient Instructions on file for this visit. No Follow-up on file.  Gaylyn Berish A Decarlo Rivet, PA-C

## 2017-01-10 ENCOUNTER — Ambulatory Visit: Payer: Medicaid Other | Admitting: Surgery

## 2017-01-12 ENCOUNTER — Encounter: Payer: Medicaid Other | Attending: Internal Medicine | Admitting: Internal Medicine

## 2017-01-12 DIAGNOSIS — J449 Chronic obstructive pulmonary disease, unspecified: Secondary | ICD-10-CM | POA: Insufficient documentation

## 2017-01-12 DIAGNOSIS — I87331 Chronic venous hypertension (idiopathic) with ulcer and inflammation of right lower extremity: Secondary | ICD-10-CM | POA: Insufficient documentation

## 2017-01-12 DIAGNOSIS — L03115 Cellulitis of right lower limb: Secondary | ICD-10-CM | POA: Diagnosis not present

## 2017-01-12 DIAGNOSIS — Z8673 Personal history of transient ischemic attack (TIA), and cerebral infarction without residual deficits: Secondary | ICD-10-CM | POA: Insufficient documentation

## 2017-01-12 DIAGNOSIS — Z86718 Personal history of other venous thrombosis and embolism: Secondary | ICD-10-CM | POA: Diagnosis not present

## 2017-01-12 DIAGNOSIS — M069 Rheumatoid arthritis, unspecified: Secondary | ICD-10-CM | POA: Insufficient documentation

## 2017-01-12 DIAGNOSIS — I509 Heart failure, unspecified: Secondary | ICD-10-CM | POA: Insufficient documentation

## 2017-01-12 DIAGNOSIS — Z87891 Personal history of nicotine dependence: Secondary | ICD-10-CM | POA: Diagnosis not present

## 2017-01-12 DIAGNOSIS — I110 Hypertensive heart disease with heart failure: Secondary | ICD-10-CM | POA: Insufficient documentation

## 2017-01-12 DIAGNOSIS — Z885 Allergy status to narcotic agent status: Secondary | ICD-10-CM | POA: Insufficient documentation

## 2017-01-12 DIAGNOSIS — F419 Anxiety disorder, unspecified: Secondary | ICD-10-CM | POA: Insufficient documentation

## 2017-01-12 DIAGNOSIS — Z880 Allergy status to penicillin: Secondary | ICD-10-CM | POA: Diagnosis not present

## 2017-01-12 DIAGNOSIS — L97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: Secondary | ICD-10-CM | POA: Insufficient documentation

## 2017-01-12 DIAGNOSIS — F329 Major depressive disorder, single episode, unspecified: Secondary | ICD-10-CM | POA: Insufficient documentation

## 2017-01-12 DIAGNOSIS — M109 Gout, unspecified: Secondary | ICD-10-CM | POA: Diagnosis not present

## 2017-01-13 NOTE — Progress Notes (Signed)
[name], [name] ([id number]) Visit Report for 01/12/2017 Abuse/Suicide Risk Screen Details Patient Name: [name], [name] Date of Service: 01/12/2017 12:30 PM Medical Record Number: [id number] Patient Account Number: [id number] Date of Birth/Sex: 1965/11/22 (51 y.o. Female) Treating RN: Roger Shelter Primary Care Khamari Sheehan: Jonna Clark Other Clinician: Referring Jaidyn Kuhl: Charlotte Crumb Treating Jw Covin/Extender: Tito Dine in Treatment: 0 Abuse/Suicide Risk Screen Items Answer ABUSE/SUICIDE RISK SCREEN: Has anyone close to you tried to hurt or harm you recentlyo No Do you feel uncomfortable with anyone in your familyo No Has anyone forced you do things that you didnot want to doo No Do you have any thoughts of harming yourselfo No Patient displays signs or symptoms of abuse and/or neglect. No Electronic Signature(s) Signed: 01/12/2017 2:59:39 PM By: Roger Shelter Entered By: Roger Shelter on 01/12/2017 12:51:12 [name] ([id number]) -------------------------------------------------------------------------------- Activities of Daily Living Details Patient Name: [name], [name] Date of Service: 01/12/2017 12:30 PM Medical Record Number: [id number] Patient Account Number: [id number] Date of Birth/Sex: 1965-12-05 (51 y.o. Female) Treating RN: Roger Shelter Primary Care Roman Sandall: Jonna Clark Other Clinician: Referring Adonys Wildes: Charlotte Crumb Treating Bryony Kaman/Extender: Tito Dine in Treatment: 0 Activities of Daily Living Items Answer Activities of Daily Living (Please select one for each item) Drive Automobile Completely Able Take Medications Completely Able Use Telephone Completely Able Care for Appearance Completely Able Use Toilet Completely Able Bath / Shower Completely Able Dress Self Completely Able Feed Self Completely Able Walk Completely Able Get In / Out Bed Completely Able Housework Completely Able Prepare Meals  Completely Able Handle Money Completely Able Shop for Self Completely Able Electronic Signature(s) Signed: 01/12/2017 2:59:39 PM By: Roger Shelter Entered By: Roger Shelter on 01/12/2017 12:51:35 [name] ([id number]) -------------------------------------------------------------------------------- Education Assessment Details Patient Name: [name] Date of Service: 01/12/2017 12:30 PM Medical Record Number: [id number] Patient Account Number: [id number] Date of Birth/Sex: Nov 20, 1965 (51 y.o. Female) Treating RN: Roger Shelter Primary Care Eiliana Drone: Jonna Clark Other Clinician: Referring Cabella Kimm: Charlotte Crumb Treating Janis Cuffe/Extender: Tito Dine in Treatment: 0 Primary Learner Assessed: Patient Learning Preferences/Education Level/Primary Language Learning Preference: Explanation Highest Education Level: High School Preferred Language: English Cognitive Barrier Assessment/Beliefs Language Barrier: No Translator Needed: No Memory Deficit: No Emotional Barrier: No Cultural/Religious Beliefs Affecting Medical Care: No Physical Barrier Assessment Impaired Vision: Yes Glasses Impaired Hearing: No Decreased Hand dexterity: No Knowledge/Comprehension Assessment Knowledge Level: High Comprehension Level: High Ability to understand written High instructions: Ability to understand verbal High instructions: Motivation Assessment Anxiety Level: Calm Cooperation: Cooperative Education Importance: Acknowledges Need Interest in Health Problems: Asks Questions Perception: Coherent Willingness to Engage in Self- High Management Activities: Readiness to Engage in Self- High Management Activities: Electronic Signature(s) Signed: 01/12/2017 2:59:39 PM By: Roger Shelter Entered By: Roger Shelter on 01/12/2017 12:52:18 Coyle, [name] ([id number]) -------------------------------------------------------------------------------- Fall Risk  Assessment Details Patient Name: [name] Date of Service: 01/12/2017 12:30 PM Medical Record Number: [id number] Patient Account Number: [id number] Date of Birth/Sex: 05-May-1965 (51 y.o. Female) Treating RN: Roger Shelter Primary Care Nickolaus Bordelon: Jonna Clark Other Clinician: Referring Howie Rufus: Charlotte Crumb Treating Viyaan Champine/Extender: Tito Dine in Treatment: 0 Fall Risk Assessment Items Have you had 2 or more falls in the last 12 monthso 0 Yes Have you had any fall that resulted in injury in the last 12 monthso 0 No FALL RISK ASSESSMENT: History of falling - immediate or within 3 months 25 Yes Secondary diagnosis 0 No Ambulatory aid None/bed rest/wheelchair/nurse 0 Yes Crutches/cane/walker 0 No Furniture 0 No IV Access/Saline Lock 0 No Gait/Training Normal/bed  rest/immobile 0 Yes Weak 0 No Impaired 0 No Mental Status Oriented to own ability 0 Yes Electronic Signature(s) Signed: 01/12/2017 2:59:39 PM By: Roger Shelter Entered By: Roger Shelter on 01/12/2017 12:52:56 Maiorino, [name] ([id number]) -------------------------------------------------------------------------------- Foot Assessment Details Patient Name: [name] Date of Service: 01/12/2017 12:30 PM Medical Record Number: [id number] Patient Account Number: [id number] Date of Birth/Sex: 02/27/1965 (51 y.o. Female) Treating RN: Roger Shelter Primary Care Tashiya Souders: Jonna Clark Other Clinician: Referring Cristine Daw: Charlotte Crumb Treating Keondre Markson/Extender: Tito Dine in Treatment: 0 Foot Assessment Items Site Locations + = Sensation present, - = Sensation absent, C = Callus, U = Ulcer R = Redness, W = Warmth, M = Maceration, PU = Pre-ulcerative lesion F = Fissure, S = Swelling, D = Dryness Assessment Right: Left: Other Deformity: No No Prior Foot Ulcer: No No Prior Amputation: No No Charcot Joint: No No Ambulatory Status: Ambulatory Without Help Gait:  Steady Electronic Signature(s) Signed: 01/12/2017 2:59:39 PM By: Roger Shelter Entered By: Roger Shelter on 01/12/2017 12:58:04 [name] ([id number]) -------------------------------------------------------------------------------- Nutrition Risk Assessment Details Patient Name: [name] Date of Service: 01/12/2017 12:30 PM Medical Record Number: [id number] Patient Account Number: [id number] Date of Birth/Sex: [birth year] (51 y.o. Female) Treating RN: Roger Shelter Primary Care Essie Lagunes: Jonna Clark Other Clinician: Referring Lashunta Frieden: Charlotte Crumb Treating Zaidy Absher/Extender: Ricard Dillon Weeks in Treatment: 0 Height (in): Weight (lbs): Body Mass Index (BMI): Nutrition Risk Assessment Items NUTRITION RISK SCREEN: I have an illness or condition that made me change the kind and/or amount of 0 No food I eat I eat fewer than two meals per day 0 No I eat few fruits and vegetables, or milk products 0 No I have three or more drinks of beer, liquor or wine almost every day 0 No I have tooth or mouth problems that make it hard for me to eat 0 No I don't always have enough money to buy the food I need 0 No I eat alone most of the time 0 No I take three or more different prescribed or over-the-counter drugs a day 0 No Without wanting to, I have lost or gained 10 pounds in the last six months 0 No I am not always physically able to shop, cook and/or feed myself 0 No Nutrition Protocols Good Risk Protocol 0 No interventions needed Moderate Risk Protocol Electronic Signature(s) Signed: 01/12/2017 2:59:39 PM By: Roger Shelter Entered By: Roger Shelter on 01/12/2017 12:53:03

## 2017-01-16 NOTE — Progress Notes (Signed)
JAKYIA, GACCIONE ([id number]) Visit Report for 01/12/2017 Debridement Details Patient Name: [name], [name] Date of Service: 01/12/2017 12:30 PM Medical Record Number: [id number] Patient Account Number: [id number] Date of Birth/Sex: January 18, 1966 (51 y.o. Female) Treating RN: Roger Shelter Primary Care Provider: Jonna Clark Other Clinician: Referring Provider: Charlotte Crumb Treating Provider/Extender: Tito Dine in Treatment: 0 Debridement Performed for Wound #1 Right,Anterior Lower Leg Assessment: Performed By: Physician Ricard Dillon, MD Debridement: Debridement Pre-procedure Verification/Time Yes - 01:26 Out Taken: Start Time: 01:26 Pain Control: Other : lidocaine 4% Level: Skin/Subcutaneous Tissue Total Area Debrided (L x W): 1.6 (cm) x 1.7 (cm) = 2.72 (cm) Tissue and other material Viable, Non-Viable, Eschar, Fibrin/Slough, Skin, Subcutaneous debrided: Instrument: Curette Bleeding: Moderate Hemostasis Achieved: Pressure End Time: 01:29 Procedural Pain: 0 Post Procedural Pain: 0 Response to Treatment: Procedure was tolerated well Post Debridement Measurements of Total Wound Length: (cm) 1.6 Width: (cm) 1.7 Depth: (cm) 0.5 Volume: (cm) 1.068 Character of Wound/Ulcer Post Debridement: Stable Post Procedure Diagnosis Same as Pre-procedure Electronic Signature(s) Signed: 01/12/2017 2:59:39 PM By: Roger Shelter Signed: 01/16/2017 7:28:22 AM By: Linton Ham MD Entered By: Linton Ham on 01/12/2017 13:36:39 [name], [name] ([id number]) -------------------------------------------------------------------------------- HPI Details Patient Name: [name] Date of Service: 01/12/2017 12:30 PM Medical Record Number: [id number] Patient Account Number: [id number] Date of Birth/Sex: 10-15-1965 (51 y.o. Female) Treating RN: Roger Shelter Primary Care Provider: Jonna Clark Other Clinician: Referring Provider: Charlotte Crumb Treating  Provider/Extender: Tito Dine in Treatment: 0 History of Present Illness HPI Description: 01/12/17; this is a 51 year old nondiabetic remote ex-smoker. She tells me that sometime in early November she developed a sudden painful area on her right medial lower leg while she was in bed at night she reached down and scratch the area and woke up in the morning with a scab small wound. This gradually got worse over time. She was seen in the ER on 12/18/16. At that point the possibility of an insect bite/brown recluse spider bite was brought up. At that point it was 1 cm eschar covered wound. She was treated with Bactrim DS. She was back in the ER on 01/09/17 with increasing size pain of the wound. At this point the patient states it was covered with a black eschar at surface. An x-ray showed punctate radio density material within the wound but no osteomyelitis. 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 She was put on clindamycin. The patient was seen on 12/7 by vascular surgery. At that point noting a 3 x 3 cm necrotic wound. ABIs were ordered. She was treated with Silvadene cream. I don't see the results of the ABIs at this point. She is using Silvadene cream. A comprehensive metabolic panel CBC and differential PT and PTT were done which were also normal. 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 COPD/asthma/depression and a history of atrial fib. She tells me she had a history of vein stripping many years ago ABIs in our clinic were noncompressible on the right at 1.59 Electronic Signature(s) Signed: 01/16/2017 7:28:22 AM By: Linton Ham MD Entered By: Linton Ham on 01/12/2017 13:42:46 [name], [name] ([id number]) -------------------------------------------------------------------------------- Physical Exam Details Patient Name: [name] Date of Service: 01/12/2017 12:30 PM Medical Record Number: [id number] Patient Account  Number: [id number] Date of Birth/Sex: [birth year] (51 y.o. Female) Treating RN: Roger Shelter Primary Care Provider: Jonna Clark Other Clinician: Referring Provider: Charlotte Crumb Treating Provider/Extender: Tito Dine in Treatment: 0 Constitutional Patient is hypertensive.. Pulse regular and within  target range for patient.Marland Kitchen Respirations regular, non-labored and within target range.. Temperature is normal and within the target range for the patient.Marland Kitchen appears in no distress. Eyes Conjunctivae clear. No discharge. Respiratory Respiratory effort is easy and symmetric bilaterally. Rate is normal at rest and on room air.. Shallow air entry bilaterally no wheezing. Cardiovascular Femoral arteries without bruits and pulses strong.. Pedal pulses palpable and strong bilaterally.. Some degree of venous hypertension with inflammation and stasis dermatitis. Gastrointestinal (GI) Abdomen is soft and non-distended without masses or tenderness. Bowel sounds active in all quadrants.. Genitourinary (GU) Bladder without fullness, masses or tenderness.. Lymphatic None palpable in the right popliteal or inguinal area. Psychiatric Pleasant patient, no overt difficulties. Notes Wound exam; the patient has a dime-sized punched out area on the right medial lower leg. This is very tender and has a necrotic surface over the wound. Using lidocaine and benzocaine spray we anesthetized the area and not to allow debridement with a #3 curet removing necrotic surface debris and subcutaneous tissue. I was able to get down to a healthy looking healthier looking wound bed but I have no doubt she will require Santyl. There is no clear surrounding erythema or crepitus. Peripheral pulses are all palpable Electronic Signature(s) Signed: 01/16/2017 7:28:22 AM By: Linton Ham MD Entered By: Linton Ham on 01/12/2017 13:45:04 [name], [name]  ([id number]) -------------------------------------------------------------------------------- Physician Orders Details Patient Name: [name] Date of Service: 01/12/2017 12:30 PM Medical Record Number: [id number] Patient Account Number: [id number] Date of Birth/Sex: 1966-01-13 (51 y.o. Female) Treating RN: Roger Shelter Primary Care Provider: Jonna Clark Other Clinician: Referring Provider: Charlotte Crumb Treating Provider/Extender: Tito Dine in Treatment: 0 Verbal / Phone Orders: No Diagnosis Coding Wound Cleansing Wound #1 Right,Anterior Lower Leg o Clean wound with Normal Saline. Anesthetic (add to Medication List) Wound #1 Right,Anterior Lower Leg o Topical Lidocaine 4% cream applied to wound bed prior to debridement (In Clinic Only). Primary Wound Dressing Wound #1 Right,Anterior Lower Leg o Santyl Ointment Secondary Dressing Wound #1 Right,Anterior Lower Leg o Boardered Foam Dressing Dressing Change Frequency Wound #1 Right,Anterior Lower Leg o Change dressing every other day. Follow-up Appointments o Return Appointment in 1 week. Patient Medications Allergies: morphine, penicillin Notifications Medication Indication Start End Santyl 01/12/2017 DOSE topical 250 unit/gram ointment - ointment topical to wound with dressing changes Electronic Signature(s) Signed: 01/12/2017 1:46:28 PM By: Linton Ham MD Entered By: Linton Ham on 01/12/2017 13:46:27 Tift, [name] ([id number]) -------------------------------------------------------------------------------- Problem List Details Patient Name: [name] Date of Service: 01/12/2017 12:30 PM Medical Record Number: [id number] Patient Account Number: [id number] Date of Birth/Sex: 1965/04/21 (51 y.o. Female) Treating RN: Roger Shelter Primary Care Provider: Jonna Clark Other Clinician: Referring Provider: Charlotte Crumb Treating Provider/Extender: Tito Dine in  Treatment: 0 Active Problems ICD-10 Encounter Code Description Active Date Diagnosis L97.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 01/12/2017 Yes I87.331 Chronic venous hypertension (idiopathic) with ulcer and 01/12/2017 Yes inflammation of right lower extremity L03.115 Cellulitis of right lower limb 01/12/2017 Yes Inactive Problems Resolved Problems Electronic Signature(s) Signed: 01/16/2017 7:28:22 AM By: Linton Ham MD Entered By: Linton Ham on 01/12/2017 13:36:06 [name], [name] ([id number]) -------------------------------------------------------------------------------- Progress Note Details Patient Name: [name] Date of Service: 01/12/2017 12:30 PM Medical Record Number: [id number] Patient Account Number: [id number] Date of Birth/Sex: [birth year] (51 y.o. Female) Treating RN: Roger Shelter Primary Care Provider: Jonna Clark Other Clinician: Referring Provider: Charlotte Crumb Treating Provider/Extender: Ricard Dillon Weeks in Treatment: 0 Subjective History of Present Illness (HPI) 01/12/17;  this is a 51 year old nondiabetic remote ex-smoker. She tells me that sometime in early November she developed a sudden painful area on her right medial lower leg while she was in bed at night she reached down and scratch the area and woke up in the morning with a scab small wound. This gradually got worse over time. She was seen in the ER on 12/18/16. At that point the possibility of an insect bite/brown recluse spider bite was brought up. At that point it was 1 cm eschar covered wound. She was treated with Bactrim DS. She was back in the ER on 01/09/17 with increasing size pain of the wound. At this point the patient states it was covered with a black eschar at surface. An x-ray showed punctate radio density material within the wound but no osteomyelitis. 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 She was put on clindamycin. The  patient was seen on 12/7 by vascular surgery. At that point noting a 3 x 3 cm necrotic wound. ABIs were ordered. She was treated with Silvadene cream. I don't see the results of the ABIs at this point. She is using Silvadene cream. A comprehensive metabolic panel CBC and differential PT and PTT were done which were also normal. 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 COPD/asthma/depression and a history of atrial fib. She tells me she had a history of vein stripping many years ago ABIs in our clinic were noncompressible on the right at 1.59 Wound History Patient presents with 1 open wound that has been present for approximately one month. Patient has been treating wound in the following manner: 3 weeks. The wound has been healed in the past but has re-opened. Laboratory tests have been performed in the last month. Patient reportedly has not tested positive for an antibiotic resistant organism. Patient reportedly has not tested positive for osteomyelitis. Patient reportedly has had testing performed to evaluate circulation in the legs. Patient experiences the following problems associated with their wounds: infection. Patient History Information obtained from Patient. Allergies morphine, penicillin Family History Cancer - Siblings, No family history of Diabetes, Heart Disease, Hypertension, Kidney Disease, Lung Disease, Seizures, Stroke, Thyroid Problems, Tuberculosis. Social History Former smoker - stopped 6 years ago, Marital Status - Separated, Alcohol Use - Rarely, Drug Use - No History, Caffeine Use - Moderate. Medical History Eyes Denies history of Cataracts, Glaucoma, Optic Neuritis Ear/Nose/Mouth/Throat NAVPREET, SZCZYGIEL ([id number]) Denies history of Chronic sinus problems/congestion, Middle ear problems Hematologic/Lymphatic Denies history of Anemia, Hemophilia, Human Immunodeficiency Virus, Lymphedema, Sickle Cell Disease Respiratory Patient  has history of Chronic Obstructive Pulmonary Disease (COPD), Sleep Apnea Denies history of Aspiration, Asthma, Pneumothorax, Tuberculosis Cardiovascular Patient has history of Arrhythmia, Congestive Heart Failure, Deep Vein Thrombosis - in lower legs and history of varicose veins, Hypertension Denies history of Angina, Coronary Artery Disease, Hypotension, Myocardial Infarction, Peripheral Arterial Disease, Peripheral Venous Disease, Phlebitis, Vasculitis Gastrointestinal Denies history of Cirrhosis , Colitis, Crohn s, Hepatitis A, Hepatitis B, Hepatitis C Endocrine Denies history of Type I Diabetes, Type II Diabetes Genitourinary Denies history of End Stage Renal Disease Immunological Denies history of Lupus Erythematosus, Raynaud s, Scleroderma Integumentary (Skin) Denies history of History of Burn, History of pressure wounds Musculoskeletal Patient has history of Gout, Rheumatoid Arthritis, Osteoarthritis Denies history of Osteomyelitis Neurologic Denies history of Dementia, Neuropathy, Quadriplegia, Paraplegia, Seizure Disorder Oncologic Denies history of Received Chemotherapy, Received Radiation Psychiatric Patient has history of Confinement Anxiety Denies history of Anorexia/bulimia Review of Systems (ROS) Constitutional  Symptoms (General Health) Denies complaints or symptoms of Fatigue, Fever, Chills, Marked Weight Change. Eyes Complains or has symptoms of Dry Eyes, Glasses / Contacts - glasses. Ear/Nose/Mouth/Throat Denies complaints or symptoms of Difficult clearing ears, Sinusitis. Hematologic/Lymphatic Denies complaints or symptoms of Bleeding / Clotting Disorders, Human Immunodeficiency Virus. Respiratory Complains or has symptoms of Shortness of Breath. Denies complaints or symptoms of Chronic or frequent coughs. Cardiovascular Complains or has symptoms of LE edema. Denies complaints or symptoms of Chest pain. Gastrointestinal Complains or has symptoms of  Frequent diarrhea. Denies complaints or symptoms of Nausea, Vomiting. Endocrine Complains or has symptoms of Thyroid disease. Denies complaints or symptoms of Hepatitis, Polydypsia (Excessive Thirst). Genitourinary Denies complaints or symptoms of Kidney failure/ Dialysis, Incontinence/dribbling. Immunological Denies complaints or symptoms of Hives, Itching. Integumentary (Skin) Complains or has symptoms of Wounds, Bleeding or bruising tendency, Breakdown, Swelling. Musculoskeletal [name], [name] ([id number]) Denies complaints or symptoms of Muscle Pain, Muscle Weakness. Neurologic Complains or has symptoms of Numbness/parasthesias. Oncologic The patient has no complaints or symptoms. Psychiatric Complains or has symptoms of Anxiety, Claustrophobia. Objective Constitutional Patient is hypertensive.. Pulse regular and within target range for patient.Marland Kitchen Respirations regular, non-labored and within target range.. Temperature is normal and within the target range for the patient.Marland Kitchen appears in no distress. Vitals Time Taken: 12:58 PM, Height: 70 in, Source: Stated, Weight: 225 lbs, Source: Stated, BMI: 32.3, Temperature: 97.9  F, Pulse: 72 bpm, Respiratory Rate: 18 breaths/min, Blood Pressure: 154/98 mmHg. Eyes Conjunctivae clear. No discharge. Respiratory Respiratory effort is easy and symmetric bilaterally. Rate is normal at rest and on room air.. Shallow air entry bilaterally no wheezing. Cardiovascular Femoral arteries without bruits and pulses strong.. Pedal pulses palpable and strong bilaterally.. Some degree of venous hypertension with inflammation and stasis dermatitis. Gastrointestinal (GI) Abdomen is soft and non-distended without masses or tenderness. Bowel sounds active in all quadrants.. Genitourinary (GU) Bladder without fullness, masses or tenderness.. Lymphatic None palpable in the right popliteal or inguinal area. Psychiatric Pleasant patient, no overt  difficulties. General Notes: Wound exam; the patient has a dime-sized punched out area on the right medial lower leg. This is very tender and has a necrotic surface over the wound. Using lidocaine and benzocaine spray we anesthetized the area and not to allow debridement with a #3 curet removing necrotic surface debris and subcutaneous tissue. I was able to get down to a healthy looking healthier looking wound bed but I have no doubt she will require Santyl. There is no clear surrounding erythema or crepitus. Peripheral pulses are all palpable Integumentary (Hair, Skin) Wound #1 status is Open. Original cause of wound was Trauma. The wound is located on the Right,Anterior Lower Leg. The wound measures 1.6cm length x 1.7cm width x 0.3cm depth; 2.136cm^2 area and 0.641cm^3 volume. There is no tunneling or undermining noted. There is a medium amount of purulent drainage noted. The wound margin is flat and intact. There is [name], [name] ([id number]) small (1-33%) red granulation within the wound bed. There is a large (67-100%) amount of necrotic tissue within the wound bed including Eschar and Adherent Slough. The periwound skin appearance exhibited: Excoriation, Erythema. The periwound skin appearance did not exhibit: Callus, Crepitus, Induration, Rash, Scarring, Dry/Scaly, Maceration, Atrophie Blanche, Cyanosis, Ecchymosis, Hemosiderin Staining, Mottled, Pallor, Rubor. The surrounding wound skin color is noted with erythema which is circumferential. Periwound temperature was noted as No Abnormality. The periwound has tenderness on palpation. Assessment Active Problems ICD-10 L97.213 - Non-pressure chronic ulcer of right calf with necrosis of muscle  N39.767 - Chronic venous hypertension (idiopathic) with ulcer and inflammation of right lower extremity L03.115 - Cellulitis of right lower limb Procedures Wound #1 Pre-procedure diagnosis of Wound #1 is a Trauma, Other located on the Right,Anterior  Lower Leg . There was a Skin/Subcutaneous Tissue Debridement ([id number]) debridement with total area of 2.72 sq cm performed by Ricard Dillon, MD. with the following instrument(s): Curette to remove Viable and Non-Viable tissue/material including Fibrin/Slough, Eschar, Skin, and Subcutaneous after achieving pain control using Other (lidocaine 4%). A time out was conducted at 01:26, prior to the start of the procedure. A Moderate amount of bleeding was controlled with Pressure. The procedure was tolerated well with a pain level of 0 throughout and a pain level of 0 following the procedure. Post Debridement Measurements: 1.6cm length x 1.7cm width x 0.5cm depth; 1.068cm^3 volume. Character of Wound/Ulcer Post Debridement is stable. Post procedure Diagnosis Wound #1: Same as Pre-Procedure Plan Wound Cleansing: Wound #1 Right,Anterior Lower Leg: Clean wound with Normal Saline. Anesthetic (add to Medication List): Wound #1 Right,Anterior Lower Leg: Topical Lidocaine 4% cream applied to wound bed prior to debridement (In Clinic Only). Primary Wound Dressing: Wound #1 Right,Anterior Lower Leg: Santyl Ointment Secondary Dressing: Wound #1 Right,Anterior Lower Leg: Boardered Foam Dressing Dressing Change Frequency: Wound #1 Right,Anterior Lower Leg: Macera, Jachelle ([id number]) Change dressing every other day. Follow-up Appointments: Return Appointment in 1 week. The following medication(s) was prescribed: Santyl topical 250 unit/gram ointment ointment topical to wound with dressing changes starting 01/12/2017 #1 wound on the right medial leg is of unclear etiology. It is my experience that most wounds of unclear etiology locally or at least many get labeled as being insect bites. While this is possible probably not likely #2 she has venous insufficiency and has had vein stripping on this this side. She also has noncompressible vessels although her peripheral pulses are robust she is  already had arterial studies ordered during her appointment with vein and vascular although I'm not clear whether these were done #3 she will definitely need Santyl as the primary dressing with border foam cover #4 she does not require wrapping her leg at this point #5 I did not add any cultures or additional antibiotics #6 if this wound continues to expand or worsens she'll need a biopsy. The patient is adamant that she did not have a skin lesion in this area Electronic Signature(s) Signed: 01/14/2017 11:11:50 AM By: Gretta Cool, BSN, RN, CWS, Kim RN, BSN Signed: 01/16/2017 7:28:22 AM By: Linton Ham MD Entered By: Gretta Cool, BSN, RN, CWS, Kim on 01/14/2017 11:11:50 [name], [name] ([id number]) -------------------------------------------------------------------------------- ROS/PFSH Details Patient Name: [name] Date of Service: 01/12/2017 12:30 PM Medical Record Number: [id number] Patient Account Number: [id number] Date of Birth/Sex: 09-14-1965 (51 y.o. Female) Treating RN: Roger Shelter Primary Care Provider: Jonna Clark Other Clinician: Referring Provider: Charlotte Crumb Treating Provider/Extender: Tito Dine in Treatment: 0 Information Obtained From Patient Wound History Do you currently have one or more open woundso Yes How many open wounds do you currently haveo 1 Approximately how long have you had your woundso one month How have you been treating your wound(s) until nowo 3 weeks Has your wound(s) ever healed and then re-openedo Yes Have you had any lab work done in the past montho Yes Have you tested positive for an antibiotic resistant organism (MRSA, VRE)o No Have you tested positive for osteomyelitis (bone infection)o No Have you had any tests for circulation on your legso Yes Where was the test doneo [facility] Have you had other problems associated with your woundso Infection Constitutional Symptoms (General Health) Complaints and  Symptoms: Negative for: Fatigue; Fever; Chills; Marked Weight Change Eyes Complaints and Symptoms: Positive for: Dry Eyes; Glasses / Contacts - glasses Medical History: Negative for: Cataracts; Glaucoma; Optic Neuritis Ear/Nose/Mouth/Throat Complaints and Symptoms: Negative for: Difficult clearing ears; Sinusitis Medical History: Negative for: Chronic sinus problems/congestion; Middle ear problems Hematologic/Lymphatic Complaints and Symptoms: Negative for: Bleeding / Clotting Disorders; Human Immunodeficiency Virus Medical History: Negative for: Anemia; Hemophilia; Human Immunodeficiency Virus; Lymphedema; Sickle Cell Disease Respiratory Complaints and Symptoms: Positive for: Shortness of Breath Negative for: Chronic or frequent coughs [name], [name] ([id number]) Medical History: Positive for: Chronic Obstructive Pulmonary Disease (COPD); Sleep Apnea Negative for: Aspiration; Asthma; Pneumothorax; Tuberculosis Cardiovascular Complaints and Symptoms: Positive for: LE edema Negative for: Chest pain Medical History: Positive for: Arrhythmia; Congestive Heart Failure; Deep Vein Thrombosis - in lower legs and history of varicose veins; Hypertension Negative for: Angina; Coronary Artery Disease; Hypotension; Myocardial Infarction; Peripheral Arterial Disease; Peripheral Venous Disease; Phlebitis; Vasculitis Gastrointestinal Complaints and Symptoms: Positive for: Frequent diarrhea Negative for: Nausea; Vomiting Medical History: Negative for: Cirrhosis ; Colitis; Crohnos; Hepatitis A; Hepatitis B; Hepatitis C Endocrine Complaints and Symptoms: Positive for: Thyroid disease Negative for: Hepatitis; Polydypsia (Excessive Thirst) Medical History: Negative for: Type I Diabetes; Type II Diabetes Genitourinary Complaints and Symptoms: Negative for: Kidney failure/ Dialysis; Incontinence/dribbling Medical History: Negative for: End Stage Renal Disease Immunological Complaints and  Symptoms: Negative for: Hives; Itching Medical History: Negative for: Lupus Erythematosus; Raynaudos; Scleroderma Integumentary (Skin) Complaints and Symptoms: Positive for: Wounds; Bleeding or bruising tendency; Breakdown; Swelling Medical History: Negative for: History of Burn; History of pressure wounds [name], [name] ([id number]) Musculoskeletal Complaints and Symptoms: Negative for: Muscle Pain; Muscle Weakness Medical History: Positive for: Gout; Rheumatoid Arthritis; Osteoarthritis Negative for: Osteomyelitis Neurologic Complaints and Symptoms: Positive for: Numbness/parasthesias Medical History: Negative for: Dementia; Neuropathy; Quadriplegia; Paraplegia; Seizure Disorder Psychiatric Complaints and Symptoms: Positive for: Anxiety; Claustrophobia Medical History: Positive for: Confinement Anxiety Negative for: Anorexia/bulimia Oncologic Complaints and Symptoms: No Complaints or Symptoms Medical History: Negative for: Received Chemotherapy; Received Radiation Immunizations Pneumococcal Vaccine: Received Pneumococcal Vaccination: No Implantable Devices Family and Social History Cancer: Yes - Siblings; Diabetes: No; Heart Disease: No; Hypertension: No; Kidney Disease: No; Lung Disease: No; Seizures: No; Stroke: No; Thyroid Problems: No; Tuberculosis: No; Former smoker - stopped 6 years ago; Marital Status - Separated; Alcohol Use: Rarely; Drug Use: No History; Caffeine Use: Moderate; Financial Concerns: No; Food, Clothing or Shelter Needs: No; Support System Lacking: No; Transportation Concerns: No; Advanced Directives: No; Patient does not want information on Advanced Directives; Do not resuscitate: No; Living Will: No; Medical Power of Attorney: No Electronic Signature(s) Signed: 01/12/2017 2:59:39 PM By: Roger Shelter Signed: 01/16/2017 7:28:22 AM By: Linton Ham MD Entered By: Roger Shelter on 01/12/2017 12:51:04 [name]  ([id number]) -------------------------------------------------------------------------------- SuperBill Details Patient Name: [name] Date of Service: 01/12/2017 Medical Record Number: [id number] Patient Account Number: [id number] Date of Birth/Sex: 1965-03-29 (51 y.o. Female) Treating RN: Roger Shelter Primary Care Provider: Jonna Clark Other Clinician: Referring Provider: Charlotte Crumb Treating Provider/Extender: Tito Dine in Treatment: 0 Diagnosis Coding ICD-10 Codes Code Description [id number] Non-pressure chronic ulcer of right calf with necrosis of muscle I87.331 Chronic venous hypertension (idiopathic) with ulcer and inflammation of right lower extremity L03.115 Cellulitis of right lower limb Facility Procedures CPT4: Description Modifier Quantity Code [id number] 99213 - WOUND CARE VISIT-LEV 3 EST PT 1 CPT4: 59163846 11042 - DEB SUBQ TISSUE  20 SQ CM/< 1 ICD-10 Diagnosis Description L97.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 I87.331 Chronic venous hypertension (idiopathic) with ulcer and inflammation of right lower  extremity Physician Procedures CPT4: Description Modifier Quantity Code 7989211 94174 - WC PHYS LEVEL 4 - NEW PT 25 1 ICD-10 Diagnosis Description L97.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

## 2017-01-16 NOTE — Progress Notes (Signed)
SHAWNTAY, PREST ([id number]) Visit Report for 01/12/2017 Allergy List Details Patient Name: DESSIRE, GRIMES Date of Service: 01/12/2017 12:30 PM Medical Record Number: [id number] Patient Account Number: [id number] Date of Birth/Sex: [birth year] (51 y.o. Female) Treating RN: Roger Shelter Primary Care Jennife Zaucha: Jonna Clark Other Clinician: Referring Jadarian Mckay: Charlotte Crumb Treating Shaquita Fort/Extender: Ricard Dillon Weeks in Treatment: 0 Allergies Active Allergies morphine penicillin Allergy Notes Electronic Signature(s) Signed: 01/12/2017 2:59:39 PM By: Roger Shelter Entered By: Roger Shelter on 01/12/2017 12:42:50 [place], [name] ([id number]) -------------------------------------------------------------------------------- Arrival Information Details Patient Name: [name] Date of Service: 01/12/2017 12:30 PM Medical Record Number: [id number] Patient Account Number: [id number] Date of Birth/Sex: 25-Mar-1965 (51 y.o. Female) Treating RN: Roger Shelter Primary Care Gaelan Glennon: Jonna Clark Other Clinician: Referring Ramell Wacha: Charlotte Crumb Treating Cora Stetson/Extender: Tito Dine in Treatment: 0 Visit Information Patient Arrived: Ambulatory Arrival Time: 12:41 Accompanied By: niece Transfer Assistance: None Patient Identification Verified: Yes Secondary Verification Process Completed: Yes Electronic Signature(s) Signed: 01/12/2017 2:59:39 PM By: Roger Shelter Entered By: Roger Shelter on 01/12/2017 12:42:06 [name] ([id number]) -------------------------------------------------------------------------------- Clinic Level of Care Assessment Details Patient Name: [name] Date of Service: 01/12/2017 12:30 PM Medical Record Number: [id number] Patient Account Number: [id number] Date of Birth/Sex: 1965-04-20 (51 y.o. Female) Treating RN: Roger Shelter Primary Care Braelyn Bordonaro: Jonna Clark Other Clinician: Referring Lynzi Meulemans: Charlotte Crumb Treating Jakorey Mcconathy/Extender: Tito Dine in Treatment: 0 Clinic Level of Care Assessment Items TOOL 1 Quantity Score X - Use when EandM and Procedure is performed on INITIAL visit 1 0 ASSESSMENTS - Nursing Assessment / Reassessment X - General Physical Exam (combine w/ comprehensive assessment (listed just below) when 1 20 performed on new pt. evals) X- 1 25 Comprehensive Assessment (HX, ROS, Risk Assessments, Wounds Hx, etc.) ASSESSMENTS - Wound and Skin Assessment / Reassessment []  - Dermatologic / Skin Assessment (not related to wound area) 0 ASSESSMENTS - Ostomy and/or Continence Assessment and Care []  - Incontinence Assessment and Management 0 []  - 0 Ostomy Care Assessment and Management (repouching, etc.) PROCESS - Coordination of Care X - Simple Patient / Family Education for ongoing care 1 15 []  - 0 Complex (extensive) Patient / Family Education for ongoing care X- 1 10 Staff obtains Programmer, systems, Records, Test Results / Process Orders []  - 0 Staff telephones HHA, Nursing Homes / Clarify orders / etc []  - 0 Routine Transfer to another Facility (non-emergent condition) []  - 0 Routine Hospital Admission (non-emergent condition) []  - 0 New Admissions / Biomedical engineer / Ordering NPWT, Apligraf, etc. []  - 0 Emergency Hospital Admission (emergent condition) PROCESS - Special Needs []  - Pediatric / Minor Patient Management 0 []  - 0 Isolation Patient Management []  - 0 Hearing / Language / Visual special needs []  - 0 Assessment of Community assistance (transportation, D/C planning, etc.) []  - 0 Additional assistance / Altered mentation []  - 0 Support Surface(s) Assessment (bed, cushion, seat, etc.) Bochenek, Eleesha ([id number]) INTERVENTIONS - Miscellaneous []  - External ear exam 0 []  - 0 Patient Transfer (multiple staff / Civil Service fast streamer / Similar devices) []  - 0 Simple Staple / Suture removal (25 or less) []  - 0 Complex Staple / Suture removal (26  or more) []  - 0 Hypo/Hyperglycemic Management (do not check if billed separately) X- 1 15 Ankle / Brachial Index (ABI) - do not check if billed separately Has the patient been seen at the hospital within the last three years: Yes Total Score: 85 Level Of Care: New/Established - Level 3 Electronic Signature(s) Signed: 01/12/2017 2:59:39 PM By: Claudina Lick,  Malachy Mood Entered By: Roger Shelter on 01/12/2017 13:39:50 [place]ieffo, [name] ([id number]) -------------------------------------------------------------------------------- Encounter Discharge Information Details Patient Name: KATIE, FARAONE Date of Service: 01/12/2017 12:30 PM Medical Record Number: [id number] Patient Account Number: [id number] Date of Birth/Sex: 11-14-1965 (51 y.o. Female) Treating RN: Roger Shelter Primary Care Nyaja [place]: Jonna Clark Other Clinician: Referring Tashema Tiller: Charlotte Crumb Treating Alyshia Kernan/Extender: Tito Dine in Treatment: 0 Encounter Discharge Information Items Discharge Pain Level: 0 Discharge Condition: Stable Ambulatory Status: Ambulatory Discharge Destination: Home Transportation: Private Auto Accompanied By: self Schedule Follow-up Appointment: Yes Medication Reconciliation completed and No provided to Patient/Care Neill Jurewicz: Provided on Clinical Summary of Care: 01/12/2017 Form Type Recipient Paper Patient DW Electronic Signature(s) Signed: 01/12/2017 2:59:39 PM By: Roger Shelter Entered By: Roger Shelter on 01/12/2017 13:40:48 [place], [name] ([id number]) -------------------------------------------------------------------------------- Lower Extremity Assessment Details Patient Name: [name] Date of Service: 01/12/2017 12:30 PM Medical Record Number: [id number] Patient Account Number: [id number] Date of Birth/Sex: 03/12/1965 (51 y.o. Female) Treating RN: Roger Shelter Primary Care Janei Scheff: Jonna Clark Other Clinician: Referring Landi Biscardi: Charlotte Crumb Treating Genee Rann/Extender: Ricard Dillon Weeks in Treatment: 0 Edema Assessment Assessed: [Left: No] [Right: No] Edema: [Left: N] [Right: o] Vascular Assessment Claudication: Claudication Assessment [Right:None] Pulses: Dorsalis Pedis Palpable: [Right:Yes] Posterior Tibial Extremity colors, hair growth, and conditions: Extremity Color: [Right:Red] Hair Growth on Extremity: [Right:Yes] Temperature of Extremity: [Right:Cool] Capillary Refill: [Right:< 3 seconds] Blood Pressure: Brachial: [Right:92] Dorsalis Pedis: [Left:Dorsalis Pedis: 146] Ankle: Posterior Tibial: [Left:Posterior Tibial: 073] [Right:1.59] Toe Nail Assessment Left: Right: Thick: No Discolored: No Deformed: No Improper Length and Hygiene: No Electronic Signature(s) Signed: 01/12/2017 2:59:39 PM By: Roger Shelter Entered By: Roger Shelter on 01/12/2017 13:11:11 Bieler, [name] ([id number]) -------------------------------------------------------------------------------- Multi Wound Chart Details Patient Name: [name] Date of Service: 01/12/2017 12:30 PM Medical Record Number: [id number] Patient Account Number: [id number] Date of Birth/Sex: 03-12-1965 (51 y.o. Female) Treating RN: Roger Shelter Primary Care Maxine Huynh: Jonna Clark Other Clinician: Referring Jabe Jeanbaptiste: Charlotte Crumb Treating Twilia Yaklin/Extender: Tito Dine in Treatment: 0 Vital Signs Height(in): 70 Pulse(bpm): 55 Weight(lbs): 225 Blood Pressure(mmHg): 154/98 Body Mass Index(BMI): 32 Temperature(F): 97.9 Respiratory Rate 18 (breaths/min): Photos: [1:No Photos] [N/A:N/A] Wound Location: [1:Right Lower Leg - Anterior] [N/A:N/A] Wounding Event: [1:Trauma] [N/A:N/A] Primary Etiology: [1:Trauma, Other] [N/A:N/A] Comorbid History: [1:Chronic Obstructive Pulmonary Disease (COPD), Sleep Apnea, Arrhythmia, Congestive Heart Failure, Deep Vein Thrombosis, Hypertension, Gout, Rheumatoid Arthritis,  Osteoarthritis, Confinement Anxiety] [N/A:N/A] Date Acquired: [1:12/09/2016] [N/A:N/A] Weeks of Treatment: [1:0] [N/A:N/A] Wound Status: [1:Open] [N/A:N/A] Measurements L x W x D [1:1.6x1.7x0.3] [N/A:N/A] (cm) Area (cm) : [1:2.136] [N/A:N/A] Volume (cm) : [1:0.641] [N/A:N/A] Classification: [1:Full Thickness Without Exposed Support Structures] [N/A:N/A] Exudate Amount: [1:Medium] [N/A:N/A] Exudate Type: [1:Purulent] [N/A:N/A] Exudate Color: [1:yellow, brown, green] [N/A:N/A] Wound Margin: [1:Flat and Intact] [N/A:N/A] Granulation Amount: [1:Small (1-33%)] [N/A:N/A] Granulation Quality: [1:Red] [N/A:N/A] Necrotic Amount: [1:Large (67-100%)] [N/A:N/A] Necrotic Tissue: [1:Eschar, Adherent Slough] [N/A:N/A] Exposed Structures: [1:Fascia: No Fat Layer (Subcutaneous Tissue) Exposed: No Tendon: No Muscle: No Joint: No Bone: No] [N/A:N/A] Epithelialization: Large (67-100%) N/A N/A Debridement: Debridement ([id number]) N/A N/A Pre-procedure 01:26 N/A N/A Verification/Time Out Taken: Pain Control: Other N/A N/A Tissue Debrided: Necrotic/Eschar, N/A N/A Fibrin/Slough, Skin, Subcutaneous Level: Skin/Subcutaneous Tissue N/A N/A Debridement Area (sq cm): 2.72 N/A N/A Instrument: Curette N/A N/A Bleeding: Moderate N/A N/A Hemostasis Achieved: Pressure N/A N/A Procedural Pain: 0 N/A N/A Post Procedural Pain: 0 N/A N/A Debridement Treatment Procedure was tolerated well N/A N/A Response: Post Debridement 1.6x1.7x0.5 N/A N/A Measurements L x W x D (cm) Post Debridement Volume: 1.068 N/A N/A (  cm) Periwound Skin Texture: Excoriation: Yes N/A N/A Induration: No Callus: No Crepitus: No Rash: No Scarring: No Periwound Skin Moisture: Maceration: No N/A N/A Dry/Scaly: No Periwound Skin Color: Erythema: Yes N/A N/A Atrophie Blanche: No Cyanosis: No Ecchymosis: No Hemosiderin Staining: No Mottled: No Pallor: No Rubor: No Erythema Location: Circumferential N/A N/A Temperature:  No Abnormality N/A N/A Tenderness on Palpation: Yes N/A N/A Wound Preparation: Ulcer Cleansing: N/A N/A Rinsed/Irrigated with Saline Topical Anesthetic Applied: Other: lidocaine 4% Procedures Performed: Debridement N/A N/A Treatment Notes Electronic Signature(s) Signed: 01/16/2017 7:28:22 AM By: Linton Ham MD Previous Signature: 01/12/2017 1:21:04 PM Version By: Roger Shelter Entered By: Linton Ham on 01/12/2017 13:36:22 [name] ([id number]) -------------------------------------------------------------------------------- Multi-Disciplinary Care Plan Details Patient Name: [name] Date of Service: 01/12/2017 12:30 PM Medical Record Number: [id number] Patient Account Number: [id number] Date of Birth/Sex: [birth year] (51 y.o. Female) Treating RN: Roger Shelter Primary Care Yazmyn Valbuena: Jonna Clark Other Clinician: Referring Jaqwon Manfred: Charlotte Crumb Treating Anari Evitt/Extender: Tito Dine in Treatment: 0 Active Inactive ` Orientation to the Wound Care Program Nursing Diagnoses: Knowledge deficit related to the wound healing center program Goals: Patient/caregiver will verbalize understanding of the [facility] Program Date Initiated: 01/12/2017 Target Resolution Date: 02/13/2016 Goal Status: Active Interventions: Provide education on orientation to the wound center Notes: ` Wound/Skin Impairment Nursing Diagnoses: Impaired tissue integrity Knowledge deficit related to ulceration/compromised skin integrity Goals: Patient/caregiver will verbalize understanding of skin care regimen Date Initiated: 01/12/2017 Target Resolution Date: 02/12/2017 Goal Status: Active Ulcer/skin breakdown will have a volume reduction of 30% by week 4 Date Initiated: 01/12/2017 Target Resolution Date: 02/12/2017 Goal Status: Active Interventions: Assess patient/caregiver ability to obtain necessary supplies Assess ulceration(s) every visit Provide  education on ulcer and skin care Treatment Activities: Skin care regimen initiated : 01/12/2017 Notes: Electronic Signature(s) Signed: 01/12/2017 1:19:16 PM By: Viona Gilmore, [name] ([id number]) Entered By: Roger Shelter on 01/12/2017 13:19:16 Ghattas, [name] ([id number]) -------------------------------------------------------------------------------- Pain Assessment Details Patient Name: [name] Date of Service: 01/12/2017 12:30 PM Medical Record Number: [id number] Patient Account Number: [id number] Date of Birth/Sex: 1965-02-11 (51 y.o. Female) Treating RN: Roger Shelter Primary Care Tiziana Cislo: Jonna Clark Other Clinician: Referring Marybella Ethier: Charlotte Crumb Treating Vicy Medico/Extender: Tito Dine in Treatment: 0 Active Problems Location of Pain Severity and Description of Pain Patient Has Paino Yes Site Locations Pain Location: Pain in Ulcers Duration of the Pain. Constant / Intermittento Constant Rate the pain. Current Pain Level: 8 Character of Pain Describe the Pain: Burning Pain Management and Medication Current Pain Management: Electronic Signature(s) Signed: 01/12/2017 2:59:39 PM By: Roger Shelter Entered By: Roger Shelter on 01/12/2017 12:42:30 Rokosz, [name] ([id number]) -------------------------------------------------------------------------------- Patient/Caregiver Education Details Patient Name: [name] Date of Service: 01/12/2017 12:30 PM Medical Record Number: [id number] Patient Account Number: [id number] Date of Birth/Gender: 03-17-1965 (51 y.o. Female) Treating RN: Roger Shelter Primary Care Physician: Jonna Clark Other Clinician: Referring Physician: Charlotte Crumb Treating Physician/Extender: Tito Dine in Treatment: 0 Education Assessment Education Provided To: Patient Education Topics Provided Welcome To [facility]: Handouts: Welcome To [facility] Methods:  Explain/Verbal Responses: State content correctly Wound Debridement: Handouts: Wound Debridement Methods: Explain/Verbal Responses: State content correctly Wound/Skin Impairment: Handouts: Caring for Your Ulcer Methods: Explain/Verbal Responses: State content correctly Electronic Signature(s) Signed: 01/12/2017 2:59:39 PM By: Roger Shelter Entered By: Roger Shelter on 01/12/2017 13:43:58 Scherzer, Kesia ([id number]) -------------------------------------------------------------------------------- Wound Assessment Details Patient Name: [name] Date of Service: 01/12/2017 12:30 PM Medical Record Number: [id number] Patient Account Number: [id number] Date  of Birth/Sex: [birth year] (51 y.o. Female) Treating RN: Roger Shelter Primary Care Elienai Gailey: Jonna Clark Other Clinician: Referring Christon Gallaway: Charlotte Crumb Treating Amadeo Coke/Extender: Ricard Dillon Weeks in Treatment: 0 Wound Status Wound Number: 1 Primary Trauma, Other Etiology: Wound Location: Right Lower Leg - Anterior Wound Open Wounding Event: Trauma Status: Date Acquired: 12/09/2016 Comorbid Chronic Obstructive Pulmonary Disease (COPD), Weeks Of Treatment: 0 History: Sleep Apnea, Arrhythmia, Congestive Heart Clustered Wound: No Failure, Deep Vein Thrombosis, Hypertension, Gout, Rheumatoid Arthritis, Osteoarthritis, Confinement Anxiety Photos Wound Measurements Length: (cm) 1.6 Width: (cm) 1.7 Depth: (cm) 0.3 Area: (cm) 2.136 Volume: (cm) 0.641 % Reduction in Area: 0% % Reduction in Volume: 0% Epithelialization: Large (67-100%) Tunneling: No Undermining: No Wound Description Full Thickness Without Exposed Support Foul Odo Classification: Structures Slough/F Wound Margin: Flat and Intact Exudate Medium Amount: Exudate Type: Purulent Exudate Color: yellow, brown, green r After Cleansing: No ibrino Yes Wound Bed Granulation Amount: Small (1-33%) Exposed Structure Granulation  Quality: Red Fascia Exposed: No Necrotic Amount: Large (67-100%) Fat Layer (Subcutaneous Tissue) Exposed: No Necrotic Quality: Eschar, Adherent Slough Tendon Exposed: No Muscle Exposed: No Joint Exposed: No Whitehorn, Yavonne ([id number]) Bone Exposed: No Periwound Skin Texture Texture Color No Abnormalities Noted: No No Abnormalities Noted: No Callus: No Atrophie Blanche: No Crepitus: No Cyanosis: No Excoriation: Yes Ecchymosis: No Induration: No Erythema: Yes Rash: No Erythema Location: Circumferential Scarring: No Hemosiderin Staining: No Mottled: No Moisture Pallor: No No Abnormalities Noted: No Rubor: No Dry / Scaly: No Maceration: No Temperature / Pain Temperature: No Abnormality Tenderness on Palpation: Yes Wound Preparation Ulcer Cleansing: Rinsed/Irrigated with Saline Topical Anesthetic Applied: Other: lidocaine 4%, Treatment Notes Wound #1 (Right, Anterior Lower Leg) 1. Cleansed with: Clean wound with Normal Saline 2. Anesthetic Topical Lidocaine 4% cream to wound bed prior to debridement 4. Dressing Applied: Santyl Ointment 5. Secondary Dressing Applied Bordered Foam Dressing Electronic Signature(s) Signed: 01/12/2017 2:58:28 PM By: Roger Shelter Entered By: Roger Shelter on 01/12/2017 14:58:28 Lyles, [name] ([id number]) -------------------------------------------------------------------------------- Vitals Details Patient Name: [name] Date of Service: 01/12/2017 12:30 PM Medical Record Number: [id number] Patient Account Number: [id number] Date of Birth/Sex: [birth year] (51 y.o. Female) Treating RN: Roger Shelter Primary Care Lateasha Breuer: Jonna Clark Other Clinician: Referring Edwen Mclester: Charlotte Crumb Treating Kassem Kibbe/Extender: Tito Dine in Treatment: 0 Vital Signs Time Taken: 12:58 Temperature (F): 97.9 Height (in): 70 Pulse (bpm): 72 Source: Stated Respiratory Rate (breaths/min): 18 Weight (lbs): 225 Blood  Pressure (mmHg): 154/98 Source: Stated Reference Range: 80 - 120 mg / dl Body Mass Index (BMI): 32.3 Electronic Signature(s) Signed: 01/12/2017 2:59:39 PM By: Roger Shelter Entered By: Roger Shelter on 01/12/2017 12:58:57

## 2017-01-19 ENCOUNTER — Encounter: Payer: Medicaid Other | Admitting: Internal Medicine

## 2017-01-19 DIAGNOSIS — I87331 Chronic venous hypertension (idiopathic) with ulcer and inflammation of right lower extremity: Secondary | ICD-10-CM | POA: Diagnosis not present

## 2017-01-19 NOTE — Progress Notes (Signed)
[name], [name] ([id number]) Visit Report for 01/19/2017 Debridement Details Patient Name: [name], [name] Date of Service: 01/19/2017 12:30 PM Medical Record Number: [id number] Patient Account Number: [id number] Date of Birth/Sex: [birth year] (51 y.o. Female) Treating RN: Roger Shelter Primary Care Provider: Jonna Clark Other Clinician: Referring Provider: Jonna Clark Treating Provider/Extender: Tito Dine in Treatment: 1 Debridement Performed for Wound #1 Right,Anterior Lower Leg Assessment: Performed By: Physician Ricard Dillon, MD Debridement: Debridement Pre-procedure Verification/Time Yes - 12:52 Out Taken: Start Time: 12:52 Pain Control: Other : lidocaine 4% Level: Skin/Subcutaneous Tissue Total Area Debrided (L x W): 2 (cm) x 1.9 (cm) = 3.8 (cm) Tissue and other material Viable, Non-Viable, Fibrin/Slough, Skin, Subcutaneous debrided: Instrument: Curette Bleeding: Moderate Hemostasis Achieved: Pressure End Time: 12:53 Procedural Pain: 0 Post Procedural Pain: 0 Response to Treatment: Procedure was tolerated well Post Debridement Measurements of Total Wound Length: (cm) 2 Width: (cm) 1.9 Depth: (cm) 0.3 Volume: (cm) 0.895 Character of Wound/Ulcer Post Debridement: Stable Post Procedure Diagnosis Same as Pre-procedure Electronic Signature(s) Signed: 01/19/2017 4:30:16 PM By: Roger Shelter Signed: 01/19/2017 4:35:05 PM By: Linton Ham MD Entered By: Linton Ham on 01/19/2017 13:19:13 [name], [name] ([id number]) -------------------------------------------------------------------------------- HPI Details Patient Name: [name] Date of Service: 01/19/2017 12:30 PM Medical Record Number: [id number] Patient Account Number: [id number] Date of Birth/Sex: 1966/01/05 (51 y.o. Female) Treating RN: Roger Shelter Primary Care Provider: Jonna Clark Other Clinician: Referring Provider: Jonna Clark Treating Provider/Extender:  Tito Dine in Treatment: 1 History of Present Illness HPI Description: 01/12/17; this is a 51 year old nondiabetic remote ex-smoker. She tells me that sometime in early November she developed a sudden painful area on her right medial lower leg while she was in bed at night she reached down and scratch the area and woke up in the morning with a scab small wound. This gradually got worse over time. She was seen in the ER on 12/18/16. At that point the possibility of an insect bite/brown recluse spider bite was brought up. At that point it was 1 cm eschar covered wound. She was treated with Bactrim DS. She was back in the ER on 01/09/17 with increasing size pain of the wound. At this point the patient states it was covered with a black eschar at surface. An x-ray showed punctate radio density material within the wound but no osteomyelitis. 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 She was put on clindamycin. The patient was seen on 12/7 by vascular surgery. At that point noting a 3 x 3 cm necrotic wound. ABIs were ordered. She was treated with Silvadene cream. I don't see the results of the ABIs at this point. She is using Silvadene cream. A comprehensive metabolic panel CBC and differential PT and PTT were done which were also normal. 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 COPD/asthma/depression and a history of atrial fib. She tells me she had a history of vein stripping many years ago ABIs in our clinic were noncompressible on the right at 1.59 01/19/17; right medial lower leg wound. Small wound with some depth. Still requiring debridement we are using Santyl. She has vascular studies on January 9 but at this point I can't tell whether these are arterial venous or both. They are at [facility] Electronic Signature(s) Signed: 01/19/2017 4:35:05 PM By: Linton Ham MD Entered By: Linton Ham on 01/19/2017  13:20:35 [name] ([id number]) -------------------------------------------------------------------------------- Physical Exam Details Patient Name: [name] Date of Service: 01/19/2017 12:30 PM Medical Record  Number: [id number] Patient Account Number: [id number] Date of Birth/Sex: Jan 07, 1966 (51 y.o. Female) Treating RN: Roger Shelter Primary Care Provider: Jonna Clark Other Clinician: Referring Provider: Jonna Clark Treating Provider/Extender: Ricard Dillon Weeks in Treatment: 1 Notes Wound exam patient's wound is on the right medial lower leg a punched out area with some depth. Once again 100% covered with a very adherent necrotic subcutaneous debridement. Debrided with a #5 curet which she tolerates marginally. Hemostasis with direct pressure. I was able to get down to a reasonably healthy looking granulated base but Santyl will need to continue for at least another week Electronic Signature(s) Signed: 01/19/2017 4:35:05 PM By: Linton Ham MD Entered By: Linton Ham on 01/19/2017 13:21:39 [name] ([id number]) -------------------------------------------------------------------------------- Physician Orders Details Patient Name: [name] Date of Service: 01/19/2017 12:30 PM Medical Record Number: [id number] Patient Account Number: [id number] Date of Birth/Sex: 1966-01-15 (51 y.o. Female) Treating RN: Roger Shelter Primary Care Provider: Jonna Clark Other Clinician: Referring Provider: Jonna Clark Treating Provider/Extender: Tito Dine in Treatment: 1 Verbal / Phone Orders: No Diagnosis Coding ICD-10 Coding Code Description [phone] Non-pressure chronic ulcer of right calf with necrosis of muscle I87.331 Chronic venous hypertension (idiopathic) with ulcer and inflammation of right lower extremity L03.115 Cellulitis of right lower limb Wound Cleansing Wound #1 Right,Anterior Lower Leg o Clean wound with Normal  Saline. Anesthetic (add to Medication List) Wound #1 Right,Anterior Lower Leg o Topical Lidocaine 4% cream applied to wound bed prior to debridement (In Clinic Only). Primary Wound Dressing Wound #1 Right,Anterior Lower Leg o Santyl Ointment Secondary Dressing Wound #1 Right,Anterior Lower Leg o Boardered Foam Dressing Dressing Change Frequency Wound #1 Right,Anterior Lower Leg o Change dressing every day. Follow-up Appointments o Return Appointment in 1 week. Electronic Signature(s) Signed: 01/19/2017 4:30:16 PM By: Roger Shelter Signed: 01/19/2017 4:35:05 PM By: Linton Ham MD Entered By: Roger Shelter on 01/19/2017 13:06:09 [name] ([id number]) -------------------------------------------------------------------------------- Problem List Details Patient Name: [name] Date of Service: 01/19/2017 12:30 PM Medical Record Number: [id number] Patient Account Number: [id number] Date of Birth/Sex: April 10, 1965 (51 y.o. Female) Treating RN: Roger Shelter Primary Care Provider: Jonna Clark Other Clinician: Referring Provider: Jonna Clark Treating Provider/Extender: Tito Dine in Treatment: 1 Active Problems ICD-10 Encounter Code Description Active Date Diagnosis L97.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 01/12/2017 Yes I87.331 Chronic venous hypertension (idiopathic) with ulcer and 01/12/2017 Yes inflammation of right lower extremity L03.115 Cellulitis of right lower limb 01/12/2017 Yes Inactive Problems Resolved Problems Electronic Signature(s) Signed: 01/19/2017 4:35:05 PM By: Linton Ham MD Entered By: Linton Ham on 01/19/2017 13:00:47 Prazak, [name] ([id number]) -------------------------------------------------------------------------------- Progress Note Details Patient Name: [name] Date of Service: 01/19/2017 12:30 PM Medical Record Number: [id number] Patient Account Number:  [id number] Date of Birth/Sex: October 10, 1965 (51 y.o. Female) Treating RN: Roger Shelter Primary Care Provider: Jonna Clark Other Clinician: Referring Provider: Jonna Clark Treating Provider/Extender: Ricard Dillon Weeks in Treatment: 1 Subjective History of Present Illness (HPI) 01/12/17; this is a 51 year old nondiabetic remote ex-smoker. She tells me that sometime in early November she developed a sudden painful area on her right medial lower leg while she was in bed at night she reached down and scratch the area and woke up in the morning with a scab small wound. This gradually got worse over time. She was seen in the ER on 12/18/16. At that point the possibility of an insect bite/brown recluse spider bite was brought up. At that point it was 1 cm eschar covered  wound. She was treated with Bactrim DS. She was back in the ER on 01/09/17 with increasing size pain of the wound. At this point the patient states it was covered with a black eschar at surface. An x-ray showed punctate radio density material within the wound but no osteomyelitis. 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 She was put on clindamycin. The patient was seen on 12/7 by vascular surgery. At that point noting a 3 x 3 cm necrotic wound. ABIs were ordered. She was treated with Silvadene cream. I don't see the results of the ABIs at this point. She is using Silvadene cream. A comprehensive metabolic panel CBC and differential PT and PTT were done which were also normal. 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 COPD/asthma/depression and a history of atrial fib. She tells me she had a history of vein stripping many years ago ABIs in our clinic were noncompressible on the right at 1.59 01/19/17; right medial lower leg wound. Small wound with some depth. Still requiring debridement we are using Santyl. She has vascular studies on January 9 but at this point I can't tell whether  these are arterial venous or both. They are at [facility] Objective Constitutional Vitals Time Taken: 12:42 PM, Height: 70 in, Weight: 225 lbs, BMI: 32.3, Temperature: 97.4 F, Pulse: 80 bpm, Respiratory Rate: 18 breaths/min, Blood Pressure: 165/111 mmHg. Integumentary (Hair, Skin) Wound #1 status is Open. Original cause of wound was Trauma. The wound is located on the Right,Anterior Lower Leg. The wound measures 2cm length x 1.9cm width x 0.2cm depth; 2.985cm^2 area and 0.597cm^3 volume. There is Fat Layer (Subcutaneous Tissue) Exposed exposed. There is no tunneling or undermining noted. There is a medium amount of purulent drainage noted. The wound margin is flat and intact. There is small (1-33%) red granulation within the wound bed. There is a large (67-100%) amount of necrotic tissue within the wound bed including Eschar and Adherent Slough. The periwound skin appearance exhibited: Excoriation. The periwound skin appearance did not exhibit: Callus, Crepitus, Induration, Rash, Scarring, Dry/Scaly, Maceration, Atrophie Blanche, Cyanosis, Ecchymosis, Hemosiderin Staining, Mottled, Pallor, Rubor, Erythema. Periwound temperature was noted as No Abnormality. The periwound has tenderness on palpation. [place], [name] ([id number]) Assessment Active Problems ICD-10 [phone] - Non-pressure chronic ulcer of right calf with necrosis of muscle I87.331 - Chronic venous hypertension (idiopathic) with ulcer and inflammation of right lower extremity L03.115 - Cellulitis of right lower limb Procedures Wound #1 Pre-procedure diagnosis of Wound #1 is a Trauma, Other located on the Right,Anterior Lower Leg . There was a Skin/Subcutaneous Tissue Debridement ([id number]) debridement with total area of 3.8 sq cm performed by Ricard Dillon, MD. with the following instrument(s): Curette to remove Viable and Non-Viable tissue/material including Fibrin/Slough, Skin, and Subcutaneous after  achieving pain control using Other (lidocaine 4%). A time out was conducted at 12:52, prior to the start of the procedure. A Moderate amount of bleeding was controlled with Pressure. The procedure was tolerated well with a pain level of 0 throughout and a pain level of 0 following the procedure. Post Debridement Measurements: 2cm length x 1.9cm width x 0.3cm depth; 0.895cm^3 volume. Character of Wound/Ulcer Post Debridement is stable. Post procedure Diagnosis Wound #1: Same as Pre-Procedure Plan Wound Cleansing: Wound #1 Right,Anterior Lower Leg: Clean wound with Normal Saline. Anesthetic (add to Medication List): Wound #1 Right,Anterior Lower Leg: Topical Lidocaine 4% cream applied to wound bed prior to debridement (In Clinic Only). Primary Wound  Dressing: Wound #1 Right,Anterior Lower Leg: Santyl Ointment Secondary Dressing: Wound #1 Right,Anterior Lower Leg: Boardered Foam Dressing Dressing Change Frequency: Wound #1 Right,Anterior Lower Leg: Change dressing every day. Follow-up Appointments: Return Appointment in 1 week. [place], [name] ([id number]) continue with santyl,foam, change to oprisma next weeks if surface will allow Electronic Signature(s) Signed: 01/19/2017 4:35:05 PM By: Linton Ham MD Entered By: Linton Ham on 01/19/2017 13:22:44 [place]ul, [name] ([id number]) -------------------------------------------------------------------------------- SuperBill Details Patient Name: [name] Date of Service: 01/19/2017 Medical Record Number: [id number] Patient Account Number: [id number] Date of Birth/Sex: 02-03-1965 (51 y.o. Female) Treating RN: Roger Shelter Primary Care Provider: Jonna Clark Other Clinician: Referring Provider: Jonna Clark Treating Provider/Extender: Ricard Dillon Weeks in Treatment: 1 Diagnosis Coding ICD-10 Codes Code Description [phone] Non-pressure chronic ulcer of right calf with necrosis of muscle I87.331 Chronic venous  hypertension (idiopathic) with ulcer and inflammation of right lower extremity L03.115 Cellulitis of right lower limb Facility Procedures CPT4: Description Modifier Quantity Code [id number] - DEB SUBQ TISSUE 20 SQ CM/< 1 ICD-10 Diagnosis Description L97.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 I87.331 Chronic venous hypertension (idiopathic) with ulcer and  inflammation of right lower extremity Physician Procedures CPT4: Description Modifier Quantity Code [id number] - WC PHYS SUBQ TISS 20 SQ CM 1 ICD-10 Diagnosis Description L97.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 I87.331 Chronic venous hypertension (idiopathic) with ulcer and  inflammation of right lower extremity Electronic Signature(s) Signed: 01/19/2017 4:35:05 PM By: Linton Ham MD Entered By: Linton Ham on 01/19/2017 13:23:54

## 2017-01-22 NOTE — Progress Notes (Signed)
JHANIA, ETHERINGTON ([id number]) Visit Report for 01/19/2017 Arrival Information Details Patient Name: [name], [name] Date of Service: 01/19/2017 12:30 PM Medical Record Number: [id number] Patient Account Number: [id number] Date of Birth/Sex: [birth year] (51 y.o. Female) Treating RN: Roger Shelter Primary Care Niguel Moure: Jonna Clark Other Clinician: Referring Aidenjames Heckmann: Jonna Clark Treating Graceanne Guin/Extender: Tito Dine in Treatment: 1 Visit Information History Since Last Visit All ordered tests and consults were completed: No Patient Arrived: Ambulatory Added or deleted any medications: No Arrival Time: 12:39 Any new allergies or adverse reactions: No Accompanied By: sister Had a fall or experienced change in No Transfer Assistance: None activities of daily living that may affect Patient Identification Verified: Yes risk of falls: Secondary Verification Process Completed: Yes Signs or symptoms of abuse/neglect since last visito No Hospitalized since last visit: No Pain Present Now: No Electronic Signature(s) Signed: 01/19/2017 4:30:16 PM By: Roger Shelter Entered By: Roger Shelter on 01/19/2017 12:39:42 [name] ([id number]) -------------------------------------------------------------------------------- Encounter Discharge Information Details Patient Name: [name] Date of Service: 01/19/2017 12:30 PM Medical Record Number: [id number] Patient Account Number: [id number] Date of Birth/Sex: 1965/07/19 (51 y.o. Female) Treating RN: Roger Shelter Primary Care Flavia Bruss: Jonna Clark Other Clinician: Referring Derrek Puff: Jonna Clark Treating Chandria Rookstool/Extender: Tito Dine in Treatment: 1 Encounter Discharge Information Items Schedule Follow-up Appointment: No Medication Reconciliation completed and No provided to Patient/Care Kathlee Barnhardt: Patient Clinical Summary of Care: Declined Electronic Signature(s) Signed: 01/21/2017 2:37:55  PM By: Ruthine Dose Entered By: Ruthine Dose on 01/19/2017 13:05:01 [place], [name] ([id number]) -------------------------------------------------------------------------------- Lower Extremity Assessment Details Patient Name: [name] Date of Service: 01/19/2017 12:30 PM Medical Record Number: [id number] Patient Account Number: [id number] Date of Birth/Sex: 1965-02-26 (51 y.o. Female) Treating RN: Roger Shelter Primary Care Sayvon Arterberry: Jonna Clark Other Clinician: Referring Nyshawn Gowdy: Jonna Clark Treating Harsimran Westman/Extender: Ricard Dillon Weeks in Treatment: 1 Edema Assessment Assessed: [Left: No] [Right: No] Edema: [Left: Ye] [Right: s] Vascular Assessment Claudication: Claudication Assessment [Right:None] Pulses: Dorsalis Pedis Palpable: [Right:Yes] Posterior Tibial Extremity colors, hair growth, and conditions: Extremity Color: [Right:Normal] Hair Growth on Extremity: [Right:No] Temperature of Extremity: [Right:Cool] Capillary Refill: [Right:< 3 seconds] Toe Nail Assessment Left: Right: Thick: No Discolored: No Deformed: No Improper Length and Hygiene: No Electronic Signature(s) Signed: 01/19/2017 4:30:16 PM By: Roger Shelter Entered By: Roger Shelter on 01/19/2017 12:47:12 [place], [name] ([id number]) -------------------------------------------------------------------------------- Multi Wound Chart Details Patient Name: [name] Date of Service: 01/19/2017 12:30 PM Medical Record Number: [id number] Patient Account Number: [id number] Date of Birth/Sex: [birth year] (51 y.o. Female) Treating RN: Roger Shelter Primary Care Lennell Shanks: Jonna Clark Other Clinician: Referring Anjanae Woehrle: Jonna Clark Treating Aastha Dayley/Extender: Tito Dine in Treatment: 1 Vital Signs Height(in): 70 Pulse(bpm): 80 Weight(lbs): 225 Blood Pressure(mmHg): 165/111 Body Mass Index(BMI): 32 Temperature(F): 97.4 Respiratory  Rate 18 (breaths/min): Photos: [1:No Photos] [N/A:N/A] Wound Location: [1:Right Lower Leg - Anterior] [N/A:N/A] Wounding Event: [1:Trauma] [N/A:N/A] Primary Etiology: [1:Trauma, Other] [N/A:N/A] Comorbid History: [1:Chronic Obstructive Pulmonary Disease (COPD), Sleep Apnea, Arrhythmia, Congestive Heart Failure, Deep Vein Thrombosis, Hypertension, Gout, Rheumatoid Arthritis, Osteoarthritis, Confinement Anxiety] [N/A:N/A] Date Acquired: [1:12/09/2016] [N/A:N/A] Weeks of Treatment: [1:1] [N/A:N/A] Wound Status: [1:Open] [N/A:N/A] Measurements L x W x D [1:2x1.9x0.2] [N/A:N/A] (cm) Area (cm) : [1:2.985] [N/A:N/A] Volume (cm) : [1:0.597] [N/A:N/A] % Reduction in Area: [1:-39.70%] [N/A:N/A] % Reduction in Volume: [1:6.90%] [N/A:N/A] Classification: [1:Full Thickness Without Exposed Support Structures] [N/A:N/A] Exudate Amount: [1:Medium] [N/A:N/A] Exudate Type: [1:Purulent] [N/A:N/A] Exudate Color: [1:yellow, brown, green] [N/A:N/A] Wound Margin: [1:Flat and Intact] [N/A:N/A] Granulation Amount: [1:Small (1-33%)] [N/A:N/A] Granulation Quality: [1:Red] [N/A:N/A] Necrotic Amount: [  1:Large (67-100%)] [N/A:N/A] Necrotic Tissue: [1:Eschar, Adherent Slough] [N/A:N/A] Exposed Structures: [1:Fat Layer (Subcutaneous Tissue) Exposed: Yes Fascia: No Tendon: No Muscle: No] [N/A:N/A] Joint: No Bone: No Epithelialization: Large (67-100%) N/A N/A Periwound Skin Texture: Excoriation: Yes N/A N/A Induration: No Callus: No Crepitus: No Rash: No Scarring: No Periwound Skin Moisture: Maceration: No N/A N/A Dry/Scaly: No Periwound Skin Color: Atrophie Blanche: No N/A N/A Cyanosis: No Ecchymosis: No Erythema: No Hemosiderin Staining: No Mottled: No Pallor: No Rubor: No Temperature: No Abnormality N/A N/A Tenderness on Palpation: Yes N/A N/A Wound Preparation: Ulcer Cleansing: N/A N/A Rinsed/Irrigated with Saline Topical Anesthetic Applied: Other: lidocaine 4% Treatment  Notes Electronic Signature(s) Signed: 01/19/2017 4:35:05 PM By: Linton Ham MD Entered By: Linton Ham on 01/19/2017 13:00:57 [place], [name] ([id number]) -------------------------------------------------------------------------------- Multi-Disciplinary Care Plan Details Patient Name: [name] Date of Service: 01/19/2017 12:30 PM Medical Record Number: [id number] Patient Account Number: [id number] Date of Birth/Sex: 12-Mar-1965 (51 y.o. Female) Treating RN: Roger Shelter Primary Care Katrena Stehlin: Jonna Clark Other Clinician: Referring Ozetta Flatley: Jonna Clark Treating Gil Ingwersen/Extender: Tito Dine in Treatment: 1 Active Inactive ` Orientation to the Wound Care Program Nursing Diagnoses: Knowledge deficit related to the wound healing center program Goals: Patient/caregiver will verbalize understanding of the [facility] Date Initiated: 01/12/2017 Target Resolution Date: 02/13/2016 Goal Status: Active Interventions: Provide education on orientation to the wound center Notes: ` Wound/Skin Impairment Nursing Diagnoses: Impaired tissue integrity Knowledge deficit related to ulceration/compromised skin integrity Goals: Patient/caregiver will verbalize understanding of skin care regimen Date Initiated: 01/12/2017 Target Resolution Date: 02/12/2017 Goal Status: Active Ulcer/skin breakdown will have a volume reduction of 30% by week 4 Date Initiated: 01/12/2017 Target Resolution Date: 02/12/2017 Goal Status: Active Interventions: Assess patient/caregiver ability to obtain necessary supplies Assess ulceration(s) every visit Provide education on ulcer and skin care Treatment Activities: Skin care regimen initiated : 01/12/2017 Notes: Electronic Signature(s) Signed: 01/19/2017 4:30:16 PM By: Viona Gilmore, [name] ([id number]) Entered By: Roger Shelter on 01/19/2017 13:06:27 [name]  ([id number]) -------------------------------------------------------------------------------- Pain Assessment Details Patient Name: [name] Date of Service: 01/19/2017 12:30 PM Medical Record Number: [id number] Patient Account Number: [id number] Date of Birth/Sex: November 07, 1965 (51 y.o. Female) Treating RN: Roger Shelter Primary Care Zenith Lamphier: Jonna Clark Other Clinician: Referring Dyanara Cozza: Jonna Clark Treating Hayk Divis/Extender: Ricard Dillon Weeks in Treatment: 1 Active Problems Location of Pain Severity and Description of Pain Patient Has Paino No Site Locations Pain Management and Medication Current Pain Management: Electronic Signature(s) Signed: 01/19/2017 4:30:16 PM By: Roger Shelter Entered By: Roger Shelter on 01/19/2017 12:39:51 [place], [name] ([id number]) -------------------------------------------------------------------------------- Patient/Caregiver Education Details Patient Name: [name] Date of Service: 01/19/2017 12:30 PM Medical Record Number: [id number] Patient Account Number: [id number] Date of Birth/Gender: 11-09-1965 (51 y.o. Female) Treating RN: Roger Shelter Primary Care Physician: Jonna Clark Other Clinician: Referring Physician: Jonna Clark Treating Physician/Extender: Tito Dine in Treatment: 1 Education Assessment Education Provided To: Patient Education Topics Provided Wound Debridement: Handouts: Wound Debridement Methods: Explain/Verbal Responses: State content correctly Wound/Skin Impairment: Handouts: Caring for Your Ulcer Methods: Explain/Verbal Responses: State content correctly Electronic Signature(s) Signed: 01/19/2017 4:30:16 PM By: Roger Shelter Entered By: Roger Shelter on 01/19/2017 13:07:21 [place], [name] ([id number]) -------------------------------------------------------------------------------- Wound Assessment Details Patient Name: [name] Date of Service:  01/19/2017 12:30 PM Medical Record Number: [id number] Patient Account Number: [id number] Date of Birth/Sex: 1965-06-27 (51 y.o. Female) Treating RN: Roger Shelter Primary Care Riyan Gavina: Jonna Clark Other Clinician: Referring Delpha Perko: Jonna Clark Treating Leverett Camplin/Extender: Ricard Dillon Weeks in Treatment: 1 Wound Status Wound  Number: 1 Primary Trauma, Other Etiology: Wound Location: Right Lower Leg - Anterior Wound Open Wounding Event: Trauma Status: Date Acquired: 12/09/2016 Comorbid Chronic Obstructive Pulmonary Disease (COPD), Weeks Of Treatment: 1 History: Sleep Apnea, Arrhythmia, Congestive Heart Clustered Wound: No Failure, Deep Vein Thrombosis, Hypertension, Gout, Rheumatoid Arthritis, Osteoarthritis, Confinement Anxiety Photos Wound Measurements Length: (cm) 2 Width: (cm) 1.9 Depth: (cm) 0.2 Area: (cm) 2.985 Volume: (cm) 0.597 % Reduction in Area: -39.7% % Reduction in Volume: 6.9% Epithelialization: Large (67-100%) Tunneling: No Undermining: No Wound Description Full Thickness Without Exposed Support Foul Odo Classification: Structures Slough/F Wound Margin: Flat and Intact Exudate Medium Amount: Exudate Type: Purulent Exudate Color: yellow, brown, green r After Cleansing: No ibrino Yes Wound Bed Granulation Amount: Small (1-33%) Exposed Structure Granulation Quality: Red Fascia Exposed: No Necrotic Amount: Large (67-100%) Fat Layer (Subcutaneous Tissue) Exposed: Yes Necrotic Quality: Eschar, Adherent Slough Tendon Exposed: No Muscle Exposed: No Joint Exposed: No Arlotta, Addalyn ([id number]) Bone Exposed: No Periwound Skin Texture Texture Color No Abnormalities Noted: No No Abnormalities Noted: No Callus: No Atrophie Blanche: No Crepitus: No Cyanosis: No Excoriation: Yes Ecchymosis: No Induration: No Erythema: No Rash: No Hemosiderin Staining: No Scarring: No Mottled: No Pallor: No Moisture Rubor: No No Abnormalities  Noted: No Dry / Scaly: No Temperature / Pain Maceration: No Temperature: No Abnormality Tenderness on Palpation: Yes Wound Preparation Ulcer Cleansing: Rinsed/Irrigated with Saline Topical Anesthetic Applied: Other: lidocaine 4%, Treatment Notes Wound #1 (Right, Anterior Lower Leg) 1. Cleansed with: Clean wound with Normal Saline 2. Anesthetic Topical Lidocaine 4% cream to wound bed prior to debridement 4. Dressing Applied: Santyl Ointment 5. Secondary Dressing Applied Bordered Foam Dressing Electronic Signature(s) Signed: 01/19/2017 1:20:07 PM By: Roger Shelter Entered By: Roger Shelter on 01/19/2017 13:20:07 [name] ([id number]) -------------------------------------------------------------------------------- Vitals Details Patient Name: [name] Date of Service: 01/19/2017 12:30 PM Medical Record Number: [id number] Patient Account Number: [id number] Date of Birth/Sex: 11-24-1965 (50 y.o. Female) Treating RN: Roger Shelter Primary Care Clodagh Odenthal: Jonna Clark Other Clinician: Referring Patt Steinhardt: Jonna Clark Treating Chadrick Sprinkle/Extender: Tito Dine in Treatment: 1 Vital Signs Time Taken: 12:42 Temperature (F): 97.4 Height (in): 70 Pulse (bpm): 80 Weight (lbs): 225 Respiratory Rate (breaths/min): 18 Body Mass Index (BMI): 32.3 Blood Pressure (mmHg): 165/111 Reference Range: 80 - 120 mg / dl Electronic Signature(s) Signed: 01/19/2017 4:30:16 PM By: Roger Shelter Entered By: Roger Shelter on 01/19/2017 12:44:53

## 2017-01-26 ENCOUNTER — Encounter: Payer: Medicaid Other | Attending: Internal Medicine | Admitting: Internal Medicine

## 2017-01-26 DIAGNOSIS — L03115 Cellulitis of right lower limb: Secondary | ICD-10-CM | POA: Insufficient documentation

## 2017-01-26 DIAGNOSIS — Z87891 Personal history of nicotine dependence: Secondary | ICD-10-CM | POA: Diagnosis not present

## 2017-01-26 DIAGNOSIS — J449 Chronic obstructive pulmonary disease, unspecified: Secondary | ICD-10-CM | POA: Diagnosis not present

## 2017-01-26 DIAGNOSIS — L97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: Secondary | ICD-10-CM | POA: Insufficient documentation

## 2017-01-26 DIAGNOSIS — F329 Major depressive disorder, single episode, unspecified: Secondary | ICD-10-CM | POA: Diagnosis not present

## 2017-01-26 DIAGNOSIS — I10 Essential (primary) hypertension: Secondary | ICD-10-CM | POA: Diagnosis not present

## 2017-01-26 DIAGNOSIS — I87331 Chronic venous hypertension (idiopathic) with ulcer and inflammation of right lower extremity: Secondary | ICD-10-CM | POA: Diagnosis present

## 2017-01-26 DIAGNOSIS — Z8673 Personal history of transient ischemic attack (TIA), and cerebral infarction without residual deficits: Secondary | ICD-10-CM | POA: Diagnosis not present

## 2017-01-26 DIAGNOSIS — I4891 Unspecified atrial fibrillation: Secondary | ICD-10-CM | POA: Insufficient documentation

## 2017-01-28 NOTE — Progress Notes (Signed)
HERA, CELAYA ([id number]) Visit Report for 01/26/2017 Arrival Information Details Patient Name: [name], [name] Date of Service: 01/26/2017 2:30 PM Medical Record Number: [id number] Patient Account Number: [id number] Date of Birth/Sex: May 08, 1965 (52 y.o. Female) Treating RN: Cornell Barman Primary Care Jaydin Boniface: Jonna Clark Other Clinician: Referring Jayvion Stefanski: Jonna Clark Treating Altonio Schwertner/Extender: Tito Dine in Treatment: 2 Visit Information History Since Last Visit Added or deleted any medications: No Patient Arrived: Ambulatory Any new allergies or adverse reactions: No Arrival Time: 14:45 Had a fall or experienced change in No Accompanied By: daughter and activities of daily living that may affect granddaughter risk of falls: Transfer Assistance: None Signs or symptoms of abuse/neglect since last visito No Patient Identification Verified: Yes Hospitalized since last visit: No Secondary Verification Process Yes Has Dressing in Place as Prescribed: Yes Completed: Pain Present Now: No Electronic Signature(s) Signed: 01/27/2017 5:50:18 PM By: Gretta Cool, BSN, RN, CWS, Kim RN, BSN Entered By: Gretta Cool, BSN, RN, CWS, Kim on 01/26/2017 14:46:33 [place], [name] ([id number]) -------------------------------------------------------------------------------- Encounter Discharge Information Details Patient Name: [name] Date of Service: 01/26/2017 2:30 PM Medical Record Number: [id number] Patient Account Number: [id number] Date of Birth/Sex: August 02, 1965 (52 y.o. Female) Treating RN: Cornell Barman Primary Care Arnola Crittendon: Jonna Clark Other Clinician: Referring Carys Malina: Jonna Clark Treating Graviela Nodal/Extender: Tito Dine in Treatment: 2 Encounter Discharge Information Items Discharge Pain Level: 0 Discharge Condition: Stable Ambulatory Status: Ambulatory Discharge Destination: Home Transportation: Private Auto Accompanied By: daughter Schedule Follow-up Appointment:  Yes Medication Reconciliation completed and Yes provided to Patient/Care Chanetta Moosman: Provided on Clinical Summary of Care: 01/26/2017 Form Type Recipient Paper Patient DW Electronic Signature(s) Signed: 01/27/2017 5:50:18 PM By: Gretta Cool, BSN, RN, CWS, Kim RN, BSN Entered By: Gretta Cool, BSN, RN, CWS, Kim on 01/26/2017 15:23:07 [name] ([id number]) -------------------------------------------------------------------------------- Lower Extremity Assessment Details Patient Name: [name] Date of Service: 01/26/2017 2:30 PM Medical Record Number: [id number] Patient Account Number: [id number] Date of Birth/Sex: 08-Feb-1965 (52 y.o. Female) Treating RN: Cornell Barman Primary Care Priscella Donna: Jonna Clark Other Clinician: Referring Davarious Tumbleson: Jonna Clark Treating Derry Arbogast/Extender: Tito Dine in Treatment: 2 Vascular Assessment Pulses: Dorsalis Pedis Palpable: [Right:Yes] Posterior Tibial Extremity colors, hair growth, and conditions: Extremity Color: [Right:Normal] Hair Growth on Extremity: [Right:No] Temperature of Extremity: [Right:Warm] Capillary Refill: [Right:< 3 seconds] Toe Nail Assessment Left: Right: Thick: No Discolored: No Deformed: No Improper Length and Hygiene: No Electronic Signature(s) Signed: 01/27/2017 5:50:18 PM By: Gretta Cool, BSN, RN, CWS, Kim RN, BSN Entered By: Gretta Cool, BSN, RN, CWS, Kim on 01/26/2017 14:52:59 [name], [name] ([id number]) -------------------------------------------------------------------------------- Multi Wound Chart Details Patient Name: [name] Date of Service: 01/26/2017 2:30 PM Medical Record Number: [id number] Patient Account Number: [id number] Date of Birth/Sex: 16-Jun-1965 (52 y.o. Female) Treating RN: Cornell Barman Primary Care Amarilys Lyles: Jonna Clark Other Clinician: Referring Jhane Lorio: Jonna Clark Treating Keya Wynes/Extender: Ricard Dillon Weeks in Treatment: 2 Vital Signs Height(in): 70 Pulse(bpm): 70 Weight(lbs):  225 Blood Pressure(mmHg): 136/93 Body Mass Index(BMI): 32 Temperature(F): 97.6 Respiratory Rate 16 (breaths/min): Photos: [N/A:N/A] Wound Location: Right Lower Leg - Anterior N/A N/A Wounding Event: Trauma N/A N/A Primary Etiology: Trauma, Other N/A N/A Comorbid History: Chronic Obstructive N/A N/A Pulmonary Disease (COPD), Sleep Apnea, Arrhythmia, Congestive Heart Failure, Deep Vein Thrombosis, Hypertension, Gout, Rheumatoid Arthritis, Osteoarthritis, Confinement Anxiety Date Acquired: 12/09/2016 N/A N/A Weeks of Treatment: 2 N/A N/A Wound Status: Open N/A N/A Measurements L x W x D 2x1.7x0.2 N/A N/A (cm) Area (cm) : 2.67 N/A N/A Volume (cm) : 0.534 N/A N/A % Reduction in Area: -25.00% N/A N/A %  Reduction in Volume: 16.70% N/A N/A Classification: Full Thickness Without N/A N/A Exposed Support Structures Exudate Amount: Medium N/A N/A Exudate Type: Purulent N/A N/A Exudate Color: yellow, brown, green N/A N/A Wound Margin: Flat and Intact N/A N/A Granulation Amount: Small (1-33%) N/A N/A Granulation Quality: Red N/A N/A Necrotic Amount: Large (67-100%) N/A N/A Necrotic Tissue: Eschar, Adherent Slough N/A N/A [place], [name] ([id number]) Exposed Structures: Fat Layer (Subcutaneous N/A N/A Tissue) Exposed: Yes Fascia: No Tendon: No Muscle: No Joint: No Bone: No Epithelialization: None N/A N/A Debridement: Debridement ([id number]) N/A N/A Pre-procedure 15:16 N/A N/A Verification/Time Out Taken: Pain Control: Other N/A N/A Tissue Debrided: Fibrin/Slough, Subcutaneous N/A N/A Level: Skin/Subcutaneous Tissue N/A N/A Debridement Area (sq cm): 3.4 N/A N/A Instrument: Curette N/A N/A Bleeding: Moderate N/A N/A Hemostasis Achieved: Pressure N/A N/A Procedural Pain: 2 N/A N/A Post Procedural Pain: 2 N/A N/A Debridement Treatment Procedure was tolerated well N/A N/A Response: Post Debridement 2x1.7x0.3 N/A N/A Measurements L x W x D (cm) Post Debridement  Volume: 0.801 N/A N/A (cm) Periwound Skin Texture: Excoriation: No N/A N/A Induration: No Callus: No Crepitus: No Rash: No Scarring: No Periwound Skin Moisture: Maceration: No N/A N/A Dry/Scaly: No Periwound Skin Color: Hemosiderin Staining: Yes N/A N/A Atrophie Blanche: No Cyanosis: No Ecchymosis: No Erythema: No Mottled: No Pallor: No Rubor: No Temperature: No Abnormality N/A N/A Tenderness on Palpation: Yes N/A N/A Wound Preparation: Ulcer Cleansing: N/A N/A Rinsed/Irrigated with Saline Topical Anesthetic Applied: Other: lidocaine 4% Procedures Performed: Debridement N/A N/A Treatment Notes Wound #1 (Right, Anterior Lower Leg) 1. Cleansed with: Clean wound with Normal Saline 2. Anesthetic Dia, [place] ([id number]) Topical Lidocaine 4% cream to wound bed prior to debridement 4. Dressing Applied: Santyl Ointment 5. Secondary Dressing Applied Bordered Foam Dressing Electronic Signature(s) Signed: 01/26/2017 5:48:27 PM By: Linton Ham MD Entered By: Linton Ham on 01/26/2017 16:50:50 [name], [name] ([id number]) -------------------------------------------------------------------------------- Multi-Disciplinary Care Plan Details Patient Name: [name] Date of Service: 01/26/2017 2:30 PM Medical Record Number: [id number] Patient Account Number: [id number] Date of Birth/Sex: Mar 28, 1965 (52 y.o. Female) Treating RN: Cornell Barman Primary Care Maureen Delatte: Jonna Clark Other Clinician: Referring Kristofor Michalowski: Jonna Clark Treating Morty Ortwein/Extender: Tito Dine in Treatment: 2 Active Inactive ` Orientation to the Wound Care Program Nursing Diagnoses: Knowledge deficit related to the wound healing center program Goals: Patient/caregiver will verbalize understanding of the [facility] Program Date Initiated: 01/12/2017 Target Resolution Date: 02/13/2016 Goal Status: Active Interventions: Provide education on orientation to the wound  center Notes: ` Wound/Skin Impairment Nursing Diagnoses: Impaired tissue integrity Knowledge deficit related to ulceration/compromised skin integrity Goals: Patient/caregiver will verbalize understanding of skin care regimen Date Initiated: 01/12/2017 Target Resolution Date: 02/12/2017 Goal Status: Active Ulcer/skin breakdown will have a volume reduction of 30% by week 4 Date Initiated: 01/12/2017 Target Resolution Date: 02/12/2017 Goal Status: Active Interventions: Assess patient/caregiver ability to obtain necessary supplies Assess ulceration(s) every visit Provide education on ulcer and skin care Treatment Activities: Skin care regimen initiated : 01/12/2017 Notes: Electronic Signature(s) Signed: 01/27/2017 5:50:18 PM By: Gretta Cool, BSN, RN, CWS, Kim RN, [place], [place] ([id number]) Entered By: Gretta Cool, BSN, RN, CWS, Kim on 01/26/2017 15:15:36 [name], [name] ([id number]) -------------------------------------------------------------------------------- Pain Assessment Details Patient Name: [name] Date of Service: 01/26/2017 2:30 PM Medical Record Number: [id number] Patient Account Number: [id number] Date of Birth/Sex: 07-15-1965 (52 y.o. Female) Treating RN: Cornell Barman Primary Care Chinonso Linker: Jonna Clark Other Clinician: Referring Mcihael Hinderman: Jonna Clark Treating Ijeoma Loor/Extender: Ricard Dillon Weeks in Treatment: 2 Active Problems Location of Pain Severity  and Description of Pain Patient Has Paino No Site Locations With Dressing Change: No Pain Management and Medication Current Pain Management: Goals for Pain Management Topical or injectable lidocaine is offered to patient for acute pain when surgical debridement is performed. If needed, Patient is instructed to use over the counter pain medication for the following 24-48 hours after debridement. Wound care MDs do not prescribed pain medications. Patient has chronic pain or uncontrolled pain. Patient has been  instructed to make an appointment with their Primary Care Physician for pain management. Electronic Signature(s) Signed: 01/27/2017 5:50:18 PM By: Gretta Cool, BSN, RN, CWS, Kim RN, BSN Entered By: Gretta Cool, BSN, RN, CWS, Kim on 01/26/2017 14:46:52 [name], [name] ([id number]) -------------------------------------------------------------------------------- Patient/Caregiver Education Details Patient Name: [name] Date of Service: 01/26/2017 2:30 PM Medical Record Number: [id number] Patient Account Number: [id number] Date of Birth/Gender: 03/02/1965 (52 y.o. Female) Treating RN: Cornell Barman Primary Care Physician: Jonna Clark Other Clinician: Referring Physician: Jonna Clark Treating Physician/Extender: Tito Dine in Treatment: 2 Education Assessment Education Provided To: Patient Education Topics Provided Wound/Skin Impairment: Handouts: Caring for Your Ulcer Methods: Demonstration, Explain/Verbal Responses: State content correctly Electronic Signature(s) Signed: 01/27/2017 5:50:18 PM By: Gretta Cool, BSN, RN, CWS, Kim RN, BSN Entered By: Gretta Cool, BSN, RN, CWS, Kim on 01/26/2017 15:23:17 [name] ([id number]) -------------------------------------------------------------------------------- Wound Assessment Details Patient Name: [name] Date of Service: 01/26/2017 2:30 PM Medical Record Number: [id number] Patient Account Number: [id number] Date of Birth/Sex: Nov 13, 1965 (52 y.o. Female) Treating RN: Cornell Barman Primary Care Jidenna Figgs: Jonna Clark Other Clinician: Referring Abdulmalik Darco: Jonna Clark Treating Deshanae Lindo/Extender: Ricard Dillon Weeks in Treatment: 2 Wound Status Wound Number: 1 Primary Trauma, Other Etiology: Wound Location: Right Lower Leg - Anterior Wound Open Wounding Event: Trauma Status: Date Acquired: 12/09/2016 Comorbid Chronic Obstructive Pulmonary Disease (COPD), Weeks Of Treatment: 2 History: Sleep Apnea, Arrhythmia, Congestive  Heart Clustered Wound: No Failure, Deep Vein Thrombosis, Hypertension, Gout, Rheumatoid Arthritis, Osteoarthritis, Confinement Anxiety Photos Wound Measurements Length: (cm) 2 Width: (cm) 1.7 Depth: (cm) 0.2 Area: (cm) 2.67 Volume: (cm) 0.534 % Reduction in Area: -25% % Reduction in Volume: 16.7% Epithelialization: None Tunneling: No Undermining: No Wound Description Full Thickness Without Exposed Support Foul Odor Classification: Structures Slough/Fi Wound Margin: Flat and Intact Exudate Medium Amount: Exudate Type: Purulent Exudate Color: yellow, brown, green After Cleansing: No brino Yes Wound Bed Granulation Amount: Small (1-33%) Exposed Structure Granulation Quality: Red Fascia Exposed: No Necrotic Amount: Large (67-100%) Fat Layer (Subcutaneous Tissue) Exposed: Yes Necrotic Quality: Eschar, Adherent Slough Tendon Exposed: No Muscle Exposed: No Joint Exposed: No Bone Exposed: No Periwound Skin Texture [name], [name] ([id number]) Texture Color No Abnormalities Noted: No No Abnormalities Noted: No Callus: No Atrophie Blanche: No Crepitus: No Cyanosis: No Excoriation: No Ecchymosis: No Induration: No Erythema: No Rash: No Hemosiderin Staining: Yes Scarring: No Mottled: No Pallor: No Moisture Rubor: No No Abnormalities Noted: No Dry / Scaly: No Temperature / Pain Maceration: No Temperature: No Abnormality Tenderness on Palpation: Yes Wound Preparation Ulcer Cleansing: Rinsed/Irrigated with Saline Topical Anesthetic Applied: Other: lidocaine 4%, Treatment Notes Wound #1 (Right, Anterior Lower Leg) 1. Cleansed with: Clean wound with Normal Saline 2. Anesthetic Topical Lidocaine 4% cream to wound bed prior to debridement 4. Dressing Applied: Santyl Ointment 5. Secondary Dressing Applied Bordered Foam Dressing Electronic Signature(s) Signed: 01/27/2017 5:50:18 PM By: Gretta Cool, BSN, RN, CWS, Kim RN, BSN Entered By: Gretta Cool, BSN, RN, CWS, Kim on  01/26/2017 14:50:49 [name], [name] ([id number]) -------------------------------------------------------------------------------- Vitals Details Patient Name: [name] Date of Service: 01/26/2017 2:30 PM  Medical Record Number: [id number] Patient Account Number: [id number] Date of Birth/Sex: December 06, 1965 (52 y.o. Female) Treating RN: Cornell Barman Primary Care Tuan Tippin: Jonna Clark Other Clinician: Referring Maleigh Bagot: Jonna Clark Treating Jowel Waltner/Extender: Tito Dine in Treatment: 2 Vital Signs Time Taken: 14:46 Temperature (F): 97.6 Height (in): 70 Pulse (bpm): 70 Weight (lbs): 225 Respiratory Rate (breaths/min): 16 Body Mass Index (BMI): 32.3 Blood Pressure (mmHg): 136/93 Reference Range: 80 - 120 mg / dl Electronic Signature(s) Signed: 01/27/2017 5:50:18 PM By: Gretta Cool, BSN, RN, CWS, Kim RN, BSN Entered By: Gretta Cool, BSN, RN, CWS, Kim on 01/26/2017 14:47:12

## 2017-01-28 NOTE — Progress Notes (Signed)
EMMERSEN, GARRAWAY ([id number]) Visit Report for 01/26/2017 Debridement Details Patient Name: [name], [name] Date of Service: 01/26/2017 2:30 PM Medical Record Number: [id number] Patient Account Number: [id number] Date of Birth/Sex: September 16, 1965 (52 y.o. Female) Treating RN: Cornell Barman Primary Care Provider: Jonna Clark Other Clinician: Referring Provider: Jonna Clark Treating Provider/Extender: Tito Dine in Treatment: 2 Debridement Performed for Wound #1 Right,Anterior Lower Leg Assessment: Performed By: Physician Ricard Dillon, MD Debridement: Debridement Pre-procedure Verification/Time Yes - 15:16 Out Taken: Start Time: 15:16 Pain Control: Other : lidocaine 4% Level: Skin/Subcutaneous Tissue Total Area Debrided (L x W): 2 (cm) x 1.7 (cm) = 3.4 (cm) Tissue and other material Viable, Non-Viable, Fibrin/Slough, Subcutaneous debrided: Instrument: Curette Bleeding: Moderate Hemostasis Achieved: Pressure End Time: 15:18 Procedural Pain: 2 Post Procedural Pain: 2 Response to Treatment: Procedure was tolerated well Post Debridement Measurements of Total Wound Length: (cm) 2 Width: (cm) 1.7 Depth: (cm) 0.3 Volume: (cm) 0.801 Character of Wound/Ulcer Post Debridement: Requires Further Debridement Post Procedure Diagnosis Same as Pre-procedure Electronic Signature(s) Signed: 01/26/2017 5:48:27 PM By: Linton Ham MD Signed: 01/27/2017 5:50:18 PM By: Gretta Cool, BSN, RN, CWS, Kim RN, BSN Entered By: Linton Ham on 01/26/2017 16:50:57 [place], [name] ([id number]) -------------------------------------------------------------------------------- HPI Details Patient Name: [name] Date of Service: 01/26/2017 2:30 PM Medical Record Number: [id number] Patient Account Number: [id number] Date of Birth/Sex: Sep 18, 1965 (52 y.o. Female) Treating RN: Cornell Barman Primary Care Provider: Jonna Clark Other Clinician: Referring Provider: Jonna Clark Treating  Provider/Extender: Tito Dine in Treatment: 2 History of Present Illness HPI Description: 01/12/17; this is a 52 year old nondiabetic remote ex-smoker. She tells me that sometime in early November she developed a sudden painful area on her right medial lower leg while she was in bed at night she reached down and scratch the area and woke up in the morning with a scab small wound. This gradually got worse over time. She was seen in the ER on 12/18/16. At that point the possibility of an insect bite/brown recluse spider bite was brought up. At that point it was 1 cm eschar covered wound. She was treated with Bactrim DS. She was back in the ER on 01/09/17 with increasing size pain of the wound. At this point the patient states it was covered with a black eschar at surface. An x-ray showed punctate radio density material within the wound but no osteomyelitis. 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 She was put on clindamycin. The patient was seen on 12/7 by vascular surgery. At that point noting a 3 x 3 cm necrotic wound. ABIs were ordered. She was treated with Silvadene cream. I don't see the results of the ABIs at this point. She is using Silvadene cream. A comprehensive metabolic panel CBC and differential PT and PTT were done which were also normal. 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 COPD/asthma/depression and a history of atrial fib. She tells me she had a history of vein stripping many years ago ABIs in our clinic were noncompressible on the right at 1.59 01/19/17; right medial lower leg wound. Small wound with some depth. Still requiring debridement we are using Santyl. She has vascular studies on January 9 but at this point I can't tell whether these are arterial venous or both. They are at [facility] 01/27/16; right medial lower leg wound. She has arterial and venous studies later this month. The wound looks  somewhat better after debridement. We've been using Santyl. Electronic Signature(s) Signed: 01/26/2017  5:48:27 PM By: Linton Ham MD Entered By: Linton Ham on 01/26/2017 16:51:57 [place], [name] ([id number]) -------------------------------------------------------------------------------- Physical Exam Details Patient Name: [name] Date of Service: 01/26/2017 2:30 PM Medical Record Number: [id number] Patient Account Number: [id number] Date of Birth/Sex: 1965-08-26 (52 y.o. Female) Treating RN: Cornell Barman Primary Care Provider: Jonna Clark Other Clinician: Referring Provider: Jonna Clark Treating Provider/Extender: Ricard Dillon Weeks in Treatment: 2 Constitutional Patient is hypertensive.. Pulse regular and within target range for patient.Marland Kitchen Respirations regular, non-labored and within target range.. Temperature is normal and within the target range for the patient.Marland Kitchen appears in no distress. Notes wound exam; small circular wound over the right medial lower leg. The patient has 100% necrotic tissue covering the wound although the debridement went easier today with a #5 curet and post debridement the wound actually looked better in terms of granulation. Unfortunately not ready for an alternative dressing i.e. continue Santyl Electronic Signature(s) Signed: 01/26/2017 5:48:27 PM By: Linton Ham MD Entered By: Linton Ham on 01/26/2017 16:53:37 [place], [name] ([id number]) -------------------------------------------------------------------------------- Physician Orders Details Patient Name: [name] Date of Service: 01/26/2017 2:30 PM Medical Record Number: [id number] Patient Account Number: [id number] Date of Birth/Sex: 03/08/1965 (52 y.o. Female) Treating RN: Cornell Barman Primary Care Provider: Jonna Clark Other Clinician: Referring Provider: Jonna Clark Treating Provider/Extender: Tito Dine in Treatment: 2 Verbal / Phone Orders: No Diagnosis  Coding Wound Cleansing Wound #1 Right,Anterior Lower Leg o Clean wound with Normal Saline. Anesthetic (add to Medication List) Wound #1 Right,Anterior Lower Leg o Topical Lidocaine 4% cream applied to wound bed prior to debridement (In Clinic Only). Primary Wound Dressing Wound #1 Right,Anterior Lower Leg o Santyl Ointment Secondary Dressing Wound #1 Right,Anterior Lower Leg o Boardered Foam Dressing Dressing Change Frequency Wound #1 Right,Anterior Lower Leg o Change dressing every day. Follow-up Appointments o Return Appointment in 1 week. Electronic Signature(s) Signed: 01/26/2017 5:48:27 PM By: Linton Ham MD Signed: 01/27/2017 5:50:18 PM By: Gretta Cool, BSN, RN, CWS, Kim RN, BSN Entered By: Gretta Cool, BSN, RN, CWS, Kim on 01/26/2017 15:22:26 [name] ([id number]) -------------------------------------------------------------------------------- Progress Note Details Patient Name: [name] Date of Service: 01/26/2017 2:30 PM Medical Record Number: [id number] Patient Account Number: [id number] Date of Birth/Sex: September 03, 1965 (53 y.o. Female) Treating RN: Cornell Barman Primary Care Provider: Jonna Clark Other Clinician: Referring Provider: Jonna Clark Treating Provider/Extender: Ricard Dillon Weeks in Treatment: 2 Subjective History of Present Illness (HPI) 01/12/17; this is a 52 year old nondiabetic remote ex-smoker. She tells me that sometime in early November she developed a sudden painful area on her right medial lower leg while she was in bed at night she reached down and scratch the area and woke up in the morning with a scab small wound. This gradually got worse over time. She was seen in the ER on 12/18/16. At that point the possibility of an insect bite/brown recluse spider bite was brought up. At that point it was 1 cm eschar covered wound. She was treated with Bactrim DS. She was back in the ER on 01/09/17 with increasing size pain of the wound. At  this point the patient states it was covered with a black eschar at surface. An x-ray showed punctate radio density material within the wound but no osteomyelitis. 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 She was put on clindamycin. The patient was seen on 12/7 by vascular surgery. At that point noting a 3 x 3 cm necrotic wound. ABIs were ordered. She was treated with Silvadene cream. I don't see the  results of the ABIs at this point. She is using Silvadene cream. A comprehensive metabolic panel CBC and differential PT and PTT were done which were also normal. 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 COPD/asthma/depression and a history of atrial fib. She tells me she had a history of vein stripping many years ago ABIs in our clinic were noncompressible on the right at 1.59 01/19/17; right medial lower leg wound. Small wound with some depth. Still requiring debridement we are using Santyl. She has vascular studies on January 9 but at this point I can't tell whether these are arterial venous or both. They are at [facility] 01/27/16; right medial lower leg wound. She has arterial and venous studies later this month. The wound looks somewhat better after debridement. We've been using Santyl. Objective Constitutional Patient is hypertensive.. Pulse regular and within target range for patient.Marland Kitchen Respirations regular, non-labored and within target range.. Temperature is normal and within the target range for the patient.Marland Kitchen appears in no distress. Vitals Time Taken: 2:46 PM, Height: 70 in, Weight: 225 lbs, BMI: 32.3, Temperature: 97.6 F, Pulse: 70 bpm, Respiratory Rate: 16 breaths/min, Blood Pressure: 136/93 mmHg. General Notes: wound exam; small circular wound over the right medial lower leg. The patient has 100% necrotic tissue covering the wound although the debridement went easier today with a #5 curet and post debridement the wound  actually looked better in terms of granulation. Unfortunately not ready for an alternative dressing i.e. continue [place], [place] ([id number]) Integumentary (Hair, Skin) Wound #1 status is Open. Original cause of wound was Trauma. The wound is located on the Right,Anterior Lower Leg. The wound measures 2cm length x 1.7cm width x 0.2cm depth; 2.67cm^2 area and 0.534cm^3 volume. There is Fat Layer (Subcutaneous Tissue) Exposed exposed. There is no tunneling or undermining noted. There is a medium amount of purulent drainage noted. The wound margin is flat and intact. There is small (1-33%) red granulation within the wound bed. There is a large (67-100%) amount of necrotic tissue within the wound bed including Eschar and Adherent Slough. The periwound skin appearance exhibited: Hemosiderin Staining. The periwound skin appearance did not exhibit: Callus, Crepitus, Excoriation, Induration, Rash, Scarring, Dry/Scaly, Maceration, Atrophie Blanche, Cyanosis, Ecchymosis, Mottled, Pallor, Rubor, Erythema. Periwound temperature was noted as No Abnormality. The periwound has tenderness on palpation. Procedures Wound #1 Pre-procedure diagnosis of Wound #1 is a Trauma, Other located on the Right,Anterior Lower Leg . There was a Skin/Subcutaneous Tissue Debridement ([id number]) debridement with total area of 3.4 sq cm performed by Ricard Dillon, MD. with the following instrument(s): Curette to remove Viable and Non-Viable tissue/material including Fibrin/Slough and Subcutaneous after achieving pain control using Other (lidocaine 4%). A time out was conducted at 15:16, prior to the start of the procedure. A Moderate amount of bleeding was controlled with Pressure. The procedure was tolerated well with a pain level of 2 throughout and a pain level of 2 following the procedure. Post Debridement Measurements: 2cm length x 1.7cm width x 0.3cm depth; 0.801cm^3 volume. Character of Wound/Ulcer Post  Debridement requires further debridement. Post procedure Diagnosis Wound #1: Same as Pre-Procedure Plan Wound Cleansing: Wound #1 Right,Anterior Lower Leg: Clean wound with Normal Saline. Anesthetic (add to Medication List): Wound #1 Right,Anterior Lower Leg: Topical Lidocaine 4% cream applied to wound bed prior to debridement (In Clinic Only). Primary Wound Dressing: Wound #1 Right,Anterior Lower Leg: Santyl Ointment Secondary Dressing: Wound #1 Right,Anterior Lower Leg: Boardered Foam Dressing Dressing Change  Frequency: Wound #1 Right,Anterior Lower Leg: Change dressing every day. Follow-up Appointments: Return Appointment in 1 week. [place], [name] ([id number]) #1 continue Santyl ointment. #2 I continue to look for an opportunity to change to something to fill in the depth of the wound #3 she has both venous and arterial studies upcoming. Possibility of a biopsy here also exists Electronic Signature(s) Signed: 01/26/2017 5:48:27 PM By: Linton Ham MD Entered By: Linton Ham on 01/26/2017 16:55:20 [place], [name] ([id number]) -------------------------------------------------------------------------------- SuperBill Details Patient Name: [name] Date of Service: 01/26/2017 Medical Record Number: [id number] Patient Account Number: [id number] Date of Birth/Sex: 16-Jul-1965 (52 y.o. Female) Treating RN: Cornell Barman Primary Care Provider: Jonna Clark Other Clinician: Referring Provider: Jonna Clark Treating Provider/Extender: Ricard Dillon Weeks in Treatment: 2 Diagnosis Coding ICD-10 Codes Code Description [phone] Non-pressure chronic ulcer of right calf with necrosis of muscle I87.331 Chronic venous hypertension (idiopathic) with ulcer and inflammation of right lower extremity L03.115 Cellulitis of right lower limb Facility Procedures CPT4: Description Modifier Quantity Code [id number] 11042 - DEB SUBQ TISSUE 20 SQ CM/< 1 ICD-10 Diagnosis Description L97.213  Non-pressure chronic ulcer of right calf with necrosis of muscle I87.331 Chronic venous hypertension (idiopathic) with ulcer and  inflammation of right lower extremity Physician Procedures CPT4: Description Modifier Quantity Code [id number] 99242 - WC PHYS SUBQ TISS 20 SQ CM 1 ICD-10 Diagnosis Description L97.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 I87.331 Chronic venous hypertension (idiopathic) with ulcer and  inflammation of right lower extremity Electronic Signature(s) Signed: 01/26/2017 5:48:27 PM By: Linton Ham MD Entered By: Linton Ham on 01/26/2017 16:55:37

## 2017-02-02 ENCOUNTER — Ambulatory Visit (INDEPENDENT_AMBULATORY_CARE_PROVIDER_SITE_OTHER): Payer: Medicaid Other

## 2017-02-02 ENCOUNTER — Ambulatory Visit: Payer: Medicaid Other | Admitting: Internal Medicine

## 2017-02-02 ENCOUNTER — Ambulatory Visit (INDEPENDENT_AMBULATORY_CARE_PROVIDER_SITE_OTHER): Payer: Medicaid Other | Admitting: Vascular Surgery

## 2017-02-02 ENCOUNTER — Encounter (INDEPENDENT_AMBULATORY_CARE_PROVIDER_SITE_OTHER): Payer: Self-pay | Admitting: Vascular Surgery

## 2017-02-02 VITALS — BP 182/133 | HR 72 | Resp 17 | Wt 224.0 lb

## 2017-02-02 DIAGNOSIS — I10 Essential (primary) hypertension: Secondary | ICD-10-CM

## 2017-02-02 DIAGNOSIS — W57XXXA Bitten or stung by nonvenomous insect and other nonvenomous arthropods, initial encounter: Secondary | ICD-10-CM

## 2017-02-02 DIAGNOSIS — L97211 Non-pressure chronic ulcer of right calf limited to breakdown of skin: Secondary | ICD-10-CM

## 2017-02-02 DIAGNOSIS — E785 Hyperlipidemia, unspecified: Secondary | ICD-10-CM

## 2017-02-02 NOTE — Progress Notes (Signed)
Subjective:    Patient ID: [name], female    DOB: 03-03-1965, 52 y.o.   MRN: [id number] Chief Complaint  Patient presents with  . Follow-up    abi ultrasound   Patient presents to review vascular studies.  She was originally seen approximately 1 month ago for a bug bite referred to our office from the [facility] emergency department.  Since her initial visit, she has been seen by the wound center and care for the blood bite located on her right calf.  The patient reports improvement to the wound.  The patient underwent a bilateral lower extremity ABI which is notable for triphasic tibials Doppler waveforms and toe brachial indices suggesting normal arterial perfusion to the bilateral lower extremities.  The patient denies any erythema to the lower extremities.  Patient denies any fever, nausea vomiting.   Review of Systems  Constitutional: Negative.   HENT: Negative.   Eyes: Negative.   Respiratory: Negative.   Cardiovascular: Negative.   Gastrointestinal: Negative.   Endocrine: Negative.   Genitourinary: Negative.   Musculoskeletal: Negative.   Skin: Positive for wound.  Allergic/Immunologic: Negative.   Neurological: Negative.   Hematological: Negative.   Psychiatric/Behavioral: Negative.       Objective:   Physical Exam  Constitutional: She is oriented to person, place, and time. She appears well-developed and well-nourished. No distress.  HENT:  Head: Normocephalic and atraumatic.  Eyes: Conjunctivae are normal. Pupils are equal, round, and reactive to light.  Neck: Normal range of motion.  Cardiovascular: Normal rate, regular rhythm, normal heart sounds and intact distal pulses.  Pulses:      Radial pulses are 2+ on the right side, and 2+ on the left side.       Dorsalis pedis pulses are 1+ on the right side, and 1+ on the left side.       Posterior tibial pulses are 1+ on the right side, and 1+ on the left side.  Pulmonary/Chest: Effort  normal and breath sounds normal.  Musculoskeletal: Normal range of motion. She exhibits no edema.  Neurological: She is alert and oriented to person, place, and time.  Skin: She is not diaphoretic.  Right calf bug bite/wound: With wound center dressing.  Surrounding skin is not erythematous.  There is no swelling.  There is no drainage.  Psychiatric: She has a normal mood and affect. Her behavior is normal. Judgment and thought content normal.  Vitals reviewed.  BP (!) 182/133 (BP Location: Right Arm)   Pulse 72   Resp 17   Wt 224 lb (101.6 kg)   BMI 32.14 kg/m   Past Medical History:  Diagnosis Date  . A-fib ([place]) 2010  . CHF (congestive heart failure) ([place])   . COPD (chronic obstructive pulmonary disease) ([place])   . Gout   . Hypertension   . Pulmonary embolus ([place])   . Stroke [facility])    Social History   Socioeconomic History  . Marital status: Legally Separated    Spouse name: Not on file  . Number of children: Not on file  . Years of education: Not on file  . Highest education level: Not on file  Social Needs  . Financial resource strain: Not on file  . Food insecurity - worry: Not on file  . Food insecurity - inability: Not on file  . Transportation needs - medical: Not on file  . Transportation needs - non-medical: Not on file  Occupational History  . Not on file  Tobacco Use  . Smoking status: Former Smoker    Types: Cigarettes    Last attempt to quit: 01/26/2016    Years since quitting: 1.0  . Smokeless tobacco: Never Used  Substance and Sexual Activity  . Alcohol use: Yes    Comment: occ  . Drug use: No  . Sexual activity: Not on file  Other Topics Concern  . Not on file  Social History Narrative  . Not on file   Past Surgical History:  Procedure Laterality Date  . bilateral wrist fractures  2010  . lt ankle fracture  2002   Family History  Problem Relation Age of Onset  . Prostate cancer Father   . Rectal cancer Sister   . Breast cancer Sister     Allergies  Allergen Reactions  . Morphine And Related Hives  . Penicillins Other (See Comments)    Painful urination Has patient had a PCN reaction causing immediate rash, facial/tongue/throat swelling, SOB or lightheadedness with hypotension: yes Has patient had a PCN reaction causing severe rash involving mucus membranes or skin necrosis: no Has patient had a PCN reaction that required hospitalization no Has patient had a PCN reaction occurring within the last 10 years: no If all of the above answers are "NO", then may proceed with Cephalosporin use.       Assessment & Plan:  Patient presents to review vascular studies.  She was originally seen approximately 1 month ago for a bug bite referred to our office from the [facility] emergency department.  Since her initial visit, she has been seen by the wound center and care for the blood bite located on her right calf.  The patient reports improvement to the wound.  The patient underwent a bilateral lower extremity ABI which is notable for triphasic tibials Doppler waveforms and toe brachial indices suggesting normal arterial perfusion to the bilateral lower extremities.  The patient denies any erythema to the lower extremities.  Patient denies any fever, nausea vomiting.  1. Bug bite, initial encounter - Stable The patient is being treated by the [facility] wound center The patient underwent a bilateral ABI which was notable for no arterial occlusive disease to the bilateral lower extremity The patient should continue local wound care with the wound center The patient is to follow-up with our office as needed  2. Essential hypertension - Stable Encouraged good control as its slows the progression of atherosclerotic disease  3. Hyperlipidemia, unspecified hyperlipidemia type - Stable Encouraged good control as its slows the progression of atherosclerotic disease  Current Outpatient Medications  on File Prior to Visit  Medication Sig Dispense Refill  . amLODipine (NORVASC) 10 MG tablet Take 10 mg by mouth daily.    Marland Kitchen aspirin EC 81 MG tablet Take 81 mg by mouth daily.    Marland Kitchen atorvastatin (LIPITOR) 20 MG tablet Take 20 mg by mouth daily.    . cloNIDine (CATAPRES) 0.1 MG tablet Take 0.1 mg by mouth 2 (two) times daily.    . diphenhydrAMINE (BENADRYL) 25 MG tablet Take 25 mg by mouth every 6 (six) hours as needed.    . hydrALAZINE (APRESOLINE) 50 MG tablet Take 50 mg by mouth 3 (three) times daily.    Marland Kitchen lisinopril (PRINIVIL,ZESTRIL) 40 MG tablet Take 40 mg by mouth daily.    . metoprolol succinate (TOPROL-XL) 100 MG 24 hr tablet Take 100 mg by mouth daily. Take with or immediately following a meal.    . ondansetron (  ZOFRAN) 4 MG tablet Take 1 tablet (4 mg total) by mouth daily as needed for nausea or vomiting. 20 tablet 1  . potassium chloride SA (KLOR-CON M20) 20 MEQ tablet Take 1 tablet (20 mEq total) by mouth 2 (two) times daily. 60 tablet 5  . sertraline (ZOLOFT) 100 MG tablet Take 100 mg by mouth daily.    . silver sulfADIAZINE (SILVADENE) 1 % cream Apply 1 application topically daily. 50 g 0  . spironolactone (ALDACTONE) 25 MG tablet Take 50 mg by mouth daily.     . furosemide (LASIX) 40 MG tablet Take 1 tablet (40 mg total) by mouth 2 (two) times daily. 180 tablet 3  . metoCLOPramide (REGLAN) 10 MG tablet Take 1 tablet (10 mg total) by mouth every 8 (eight) hours as needed for nausea. 20 tablet 0   No current facility-administered medications on file prior to visit.    There are no Patient Instructions on file for this visit. No Follow-up on file.  Shamar Kracke A Ingeborg Fite, PA-C

## 2017-02-08 ENCOUNTER — Encounter: Payer: Medicaid Other | Admitting: Physician Assistant

## 2017-02-08 DIAGNOSIS — I87331 Chronic venous hypertension (idiopathic) with ulcer and inflammation of right lower extremity: Secondary | ICD-10-CM | POA: Diagnosis not present

## 2017-02-09 NOTE — Progress Notes (Signed)
[name], [name] ([id number]) Visit Report for 02/08/2017 Arrival Information Details Patient Name: [name], [name] Date of Service: 02/08/2017 10:15 AM Medical Record Number: [id number] Patient Account Number: [id number] Date of Birth/Sex: 12-Aug-1965 (52 y.o. Female) Treating RN: Roger Shelter Primary Care Solon Alban: Jonna Clark Other Clinician: Referring Iasiah Ozment: Jonna Clark Treating Shilo Philipson/Extender: Melburn Hake, HOYT Weeks in Treatment: 3 Visit Information History Since Last Visit All ordered tests and consults were completed: No Patient Arrived: Ambulatory Added or deleted any medications: No Arrival Time: 10:24 Any new allergies or adverse reactions: No Accompanied By: friend Had a fall or experienced change in No Transfer Assistance: None activities of daily living that may affect risk of falls: Signs or symptoms of abuse/neglect since last visito No Hospitalized since last visit: No Pain Present Now: Yes Electronic Signature(s) Signed: 02/08/2017 4:11:02 PM By: Roger Shelter Entered By: Roger Shelter on 02/08/2017 10:24:59 [name] ([id number]) -------------------------------------------------------------------------------- Encounter Discharge Information Details Patient Name: [name] Date of Service: 02/08/2017 10:15 AM Medical Record Number: [id number] Patient Account Number: [id number] Date of Birth/Sex: 01/25/1966 (52 y.o. Female) Treating RN: Roger Shelter Primary Care Miosotis Wetsel: Jonna Clark Other Clinician: Referring Tesa Meadors: Jonna Clark Treating Naavya Postma/Extender: Melburn Hake, HOYT Weeks in Treatment: 3 Encounter Discharge Information Items Discharge Pain Level: 0 Discharge Condition: Stable Ambulatory Status: Ambulatory Discharge Destination: Home Transportation: Private Auto Accompanied By: friend Schedule Follow-up Appointment: Yes Medication Reconciliation completed and No provided to Patient/Care Cyle Kenyon: Patient Clinical  Summary of Care: Declined Electronic Signature(s) Signed: 02/08/2017 4:11:02 PM By: Roger Shelter Entered By: Roger Shelter on 02/08/2017 11:17:47 Creary, [name] ([id number]) -------------------------------------------------------------------------------- Lower Extremity Assessment Details Patient Name: [name] Date of Service: 02/08/2017 10:15 AM Medical Record Number: [id number] Patient Account Number: [id number] Date of Birth/Sex: [birth year] (52 y.o. Female) Treating RN: Roger Shelter Primary Care Lachrisha Ziebarth: Jonna Clark Other Clinician: Referring Terrilee Dudzik: Jonna Clark Treating Joshoa Shawler/Extender: STONE III, HOYT Weeks in Treatment: 3 Edema Assessment Assessed: [Left: No] [Right: No] Edema: [Left: Ye] [Right: s] Vascular Assessment Claudication: Claudication Assessment [Right:Intermittent] Pulses: Dorsalis Pedis Palpable: [Right:Yes] Posterior Tibial Extremity colors, hair growth, and conditions: Extremity Color: [Right:Normal] Hair Growth on Extremity: [Right:Yes] Temperature of Extremity: [Right:Warm] Capillary Refill: [Right:< 3 seconds] Toe Nail Assessment Left: Right: Thick: No Discolored: No Deformed: No Improper Length and Hygiene: No Electronic Signature(s) Signed: 02/08/2017 4:11:02 PM By: Roger Shelter Entered By: Roger Shelter on 02/08/2017 10:34:17 Baril, [name] ([id number]) -------------------------------------------------------------------------------- Multi Wound Chart Details Patient Name: [name] Date of Service: 02/08/2017 10:15 AM Medical Record Number: [id number] Patient Account Number: [id number] Date of Birth/Sex: [birth year] (52 y.o. Female) Treating RN: Roger Shelter Primary Care Cloie Wooden: Jonna Clark Other Clinician: Referring Chanee Henrickson: Jonna Clark Treating Nekoda Chock/Extender: STONE III, HOYT Weeks in Treatment: 3 Vital Signs Height(in): 70 Pulse(bpm): 70 Weight(lbs): 225 Blood Pressure(mmHg): 148/100 Body  Mass Index(BMI): 32 Temperature(F): 98.3 Respiratory Rate 16 (breaths/min): Photos: [1:No Photos] [N/A:N/A] Wound Location: [1:Right Lower Leg - Anterior] [N/A:N/A] Wounding Event: [1:Trauma] [N/A:N/A] Primary Etiology: [1:Trauma, Other] [N/A:N/A] Comorbid History: [1:Chronic Obstructive Pulmonary Disease (COPD), Sleep Apnea, Arrhythmia, Congestive Heart Failure, Deep Vein Thrombosis, Hypertension, Gout, Rheumatoid Arthritis, Osteoarthritis, Confinement Anxiety] [N/A:N/A] Date Acquired: [1:12/09/2016] [N/A:N/A] Weeks of Treatment: [1:3] [N/A:N/A] Wound Status: [1:Open] [N/A:N/A] Measurements L x W x D [1:2.1x2x0.3] [N/A:N/A] (cm) Area (cm) : [1:3.299] [N/A:N/A] Volume (cm) : [1:0.99] [N/A:N/A] % Reduction in Area: [1:-54.40%] [N/A:N/A] % Reduction in Volume: [1:-54.40%] [N/A:N/A] Classification: [1:Full Thickness Without Exposed Support Structures] [N/A:N/A] Exudate Amount: [1:Large] [N/A:N/A] Exudate Type: [1:Purulent] [N/A:N/A] Exudate Color: [1:yellow, brown, green] [N/A:N/A] Wound Margin: [1:Flat and Intact] [N/A:N/A] Granulation  Amount: [1:Medium (34-66%)] [N/A:N/A] Granulation Quality: [1:Red] [N/A:N/A] Necrotic Amount: [1:Medium (34-66%)] [N/A:N/A] Necrotic Tissue: [1:Eschar, Adherent Slough] [N/A:N/A] Exposed Structures: [1:Fat Layer (Subcutaneous Tissue) Exposed: Yes Fascia: No Tendon: No Muscle: No] [N/A:N/A] Joint: No Bone: No Epithelialization: None N/A N/A Periwound Skin Texture: Excoriation: Yes N/A N/A Induration: No Callus: No Crepitus: No Rash: No Scarring: No Periwound Skin Moisture: Maceration: Yes N/A N/A Dry/Scaly: No Periwound Skin Color: Hemosiderin Staining: Yes N/A N/A Atrophie Blanche: No Cyanosis: No Ecchymosis: No Erythema: No Mottled: No Pallor: No Rubor: No Temperature: No Abnormality N/A N/A Tenderness on Palpation: Yes N/A N/A Wound Preparation: Ulcer Cleansing: N/A N/A Rinsed/Irrigated with Saline Topical Anesthetic  Applied: Other: lidocaine 4% Treatment Notes Electronic Signature(s) Signed: 02/08/2017 4:11:02 PM By: Roger Shelter Entered By: Roger Shelter on 02/08/2017 10:34:36 Termine, [name] ([id number]) -------------------------------------------------------------------------------- [place] Details Patient Name: [name] Date of Service: 02/08/2017 10:15 AM Medical Record Number: [id number] Patient Account Number: [id number] Date of Birth/Sex: 04-Jul-1965 (52 y.o. Female) Treating RN: Roger Shelter Primary Care Jaye Saal: Jonna Clark Other Clinician: Referring Airen Dales: Jonna Clark Treating Felipe Cabell/Extender: Melburn Hake, HOYT Weeks in Treatment: 3 Active Inactive ` Orientation to the Wound Care Program Nursing Diagnoses: Knowledge deficit related to the wound healing center program Goals: Patient/caregiver will verbalize understanding of the [facility] Program Date Initiated: 01/12/2017 Target Resolution Date: 02/13/2016 Goal Status: Active Interventions: Provide education on orientation to the wound center Notes: ` Wound/Skin Impairment Nursing Diagnoses: Impaired tissue integrity Knowledge deficit related to ulceration/compromised skin integrity Goals: Patient/caregiver will verbalize understanding of skin care regimen Date Initiated: 01/12/2017 Target Resolution Date: 02/12/2017 Goal Status: Active Ulcer/skin breakdown will have a volume reduction of 30% by week 4 Date Initiated: 01/12/2017 Target Resolution Date: 02/12/2017 Goal Status: Active Interventions: Assess patient/caregiver ability to obtain necessary supplies Assess ulceration(s) every visit Provide education on ulcer and skin care Treatment Activities: Skin care regimen initiated : 01/12/2017 Notes: Electronic Signature(s) Signed: 02/08/2017 4:11:02 PM By: Viona Gilmore, [name] ([id number]) Entered By: Roger Shelter on 02/08/2017 10:34:23 [name]  ([id number]) -------------------------------------------------------------------------------- Pain Assessment Details Patient Name: [name] Date of Service: 02/08/2017 10:15 AM Medical Record Number: [id number] Patient Account Number: [id number] Date of Birth/Sex: 04-01-1965 (52 y.o. Female) Treating RN: Roger Shelter Primary Care Cord Wilczynski: Jonna Clark Other Clinician: Referring Jeania Nater: Jonna Clark Treating Meadow Abramo/Extender: Melburn Hake, HOYT Weeks in Treatment: 3 Active Problems Location of Pain Severity and Description of Pain Patient Has Paino Yes Site Locations Pain Location: Pain in Ulcers Duration of the Pain. Constant / Intermittento Constant Rate the pain. Current Pain Level: 5 Character of Pain Describe the Pain: Burning Pain Management and Medication Current Pain Management: Electronic Signature(s) Signed: 02/08/2017 4:11:02 PM By: Roger Shelter Entered By: Roger Shelter on 02/08/2017 10:25:20 [name] ([id number]) -------------------------------------------------------------------------------- Patient/Caregiver Education Details Patient Name: [name] Date of Service: 02/08/2017 10:15 AM Medical Record Number: [id number] Patient Account Number: [id number] Date of Birth/Gender: 1965-03-05 (52 y.o. Female) Treating RN: Roger Shelter Primary Care Physician: Jonna Clark Other Clinician: Referring Physician: Jonna Clark Treating Physician/Extender: Sharalyn Ink in Treatment: 3 Education Assessment Education Provided To: Patient Education Topics Provided Wound/Skin Impairment: Handouts: Caring for Your Ulcer Methods: Explain/Verbal Responses: State content correctly Electronic Signature(s) Signed: 02/08/2017 4:11:02 PM By: Roger Shelter Entered By: Roger Shelter on 02/08/2017 11:17:59 [place], [name] ([id number]) -------------------------------------------------------------------------------- Wound Assessment  Details Patient Name: [name] Date of Service: 02/08/2017 10:15 AM Medical Record Number: [id number] Patient Account Number: [id number] Date of Birth/Sex: September 11, 1965 (51 y.o. Female) Treating RN: Flinchum,  Cheryl Primary Care Blonnie Maske: Jonna Clark Other Clinician: Referring Shawnita Krizek: Jonna Clark Treating Ailany Koren/Extender: Melburn Hake, HOYT Weeks in Treatment: 3 Wound Status Wound Number: 1 Primary Trauma, Other Etiology: Wound Location: Right Lower Leg - Anterior Wound Open Wounding Event: Trauma Status: Date Acquired: 12/09/2016 Comorbid Chronic Obstructive Pulmonary Disease (COPD), Weeks Of Treatment: 3 History: Sleep Apnea, Arrhythmia, Congestive Heart Clustered Wound: No Failure, Deep Vein Thrombosis, Hypertension, Gout, Rheumatoid Arthritis, Osteoarthritis, Confinement Anxiety Photos Photo Uploaded By: Roger Shelter on 02/08/2017 16:13:52 Wound Measurements Length: (cm) 2.1 Width: (cm) 2 Depth: (cm) 0.3 Area: (cm) 3.299 Volume: (cm) 0.99 % Reduction in Area: -54.4% % Reduction in Volume: -54.4% Epithelialization: None Tunneling: No Undermining: No Wound Description Full Thickness Without Exposed Support Classification: Structures Wound Margin: Flat and Intact Exudate Large Amount: Exudate Type: Purulent Exudate Color: yellow, brown, green Foul Odor After Cleansing: No Slough/Fibrino Yes Wound Bed Granulation Amount: Medium (34-66%) Exposed Structure Granulation Quality: Red Fascia Exposed: No Necrotic Amount: Medium (34-66%) Fat Layer (Subcutaneous Tissue) Exposed: Yes Necrotic Quality: Eschar, Adherent Slough Tendon Exposed: No Muscle Exposed: No Mannis, Karolyne ([id number]) Joint Exposed: No Bone Exposed: No Periwound Skin Texture Texture Color No Abnormalities Noted: No No Abnormalities Noted: No Callus: No Atrophie Blanche: No Crepitus: No Cyanosis: No Excoriation: Yes Ecchymosis: No Induration: No Erythema: No Rash:  No Hemosiderin Staining: Yes Scarring: No Mottled: No Pallor: No Moisture Rubor: No No Abnormalities Noted: No Dry / Scaly: No Temperature / Pain Maceration: Yes Temperature: No Abnormality Tenderness on Palpation: Yes Wound Preparation Ulcer Cleansing: Rinsed/Irrigated with Saline Topical Anesthetic Applied: Other: lidocaine 4%, Treatment Notes Wound #1 (Right, Anterior Lower Leg) 1. Cleansed with: Clean wound with Normal Saline 2. Anesthetic Topical Lidocaine 4% cream to wound bed prior to debridement 4. Dressing Applied: Santyl Ointment Other dressing (specify in notes) 5. Secondary Dressing Applied Bordered Foam Dressing Notes silvercell over santyl Electronic Signature(s) Signed: 02/08/2017 4:11:02 PM By: Roger Shelter Entered By: Roger Shelter on 02/08/2017 10:33:40 [place]urth, [name] ([id number]) -------------------------------------------------------------------------------- Vitals Details Patient Name: [name] Date of Service: 02/08/2017 10:15 AM Medical Record Number: [id number] Patient Account Number: [id number] Date of Birth/Sex: 07-07-1965 (52 y.o. Female) Treating RN: Roger Shelter Primary Care Willena Jeancharles: Jonna Clark Other Clinician: Referring Chioke Noxon: Jonna Clark Treating Markeria Goetsch/Extender: STONE III, HOYT Weeks in Treatment: 3 Vital Signs Time Taken: 10:25 Temperature (F): 98.3 Height (in): 70 Pulse (bpm): 70 Weight (lbs): 225 Respiratory Rate (breaths/min): 16 Body Mass Index (BMI): 32.3 Blood Pressure (mmHg): 148/100 Reference Range: 80 - 120 mg / dl Electronic Signature(s) Signed: 02/08/2017 4:11:02 PM By: Roger Shelter Entered By: Roger Shelter on 02/08/2017 10:29:18

## 2017-02-09 NOTE — Progress Notes (Signed)
[name], [name] ([id number]) Visit Report for 02/08/2017 Chief Complaint Document Details Patient Name: [name], [name] Date of Service: 02/08/2017 10:15 AM Medical Record Number: [id number] Patient Account Number: [id number] Date of Birth/Sex: 03/09/1965 (52 y.o. Female) Treating RN: Roger Shelter Primary Care Provider: Jonna Clark Other Clinician: Referring Provider: Jonna Clark Treating Provider/Extender: Melburn Hake, HOYT Weeks in Treatment: 3 Information Obtained from: Patient Chief Complaint Right LE Ulcer Electronic Signature(s) Signed: 02/08/2017 4:32:07 PM By: Worthy Keeler PA-C Entered By: Worthy Keeler on 02/08/2017 11:07:51 [place], [name] ([id number]) -------------------------------------------------------------------------------- Debridement Details Patient Name: [name] Date of Service: 02/08/2017 10:15 AM Medical Record Number: [id number] Patient Account Number: [id number] Date of Birth/Sex: [birth year] (52 y.o. Female) Treating RN: Roger Shelter Primary Care Provider: Jonna Clark Other Clinician: Referring Provider: Jonna Clark Treating Provider/Extender: STONE III, HOYT Weeks in Treatment: 3 Debridement Performed for Wound #1 Right,Anterior Lower Leg Assessment: Performed By: Physician STONE III, HOYT E., PA-C Debridement: Debridement Pre-procedure Verification/Time Yes - 11:01 Out Taken: Start Time: 11:01 Pain Control: Other : lidocaine 4% Level: Skin/Subcutaneous Tissue Total Area Debrided (L x W): 2.1 (cm) x 2 (cm) = 4.2 (cm) Tissue and other material Viable, Non-Viable, Fibrin/Slough, Skin, Subcutaneous debrided: Instrument: Curette Bleeding: Moderate Hemostasis Achieved: Pressure End Time: 11:03 Procedural Pain: 2 Post Procedural Pain: 0 Response to Treatment: Procedure was tolerated well Post Debridement Measurements of Total Wound Length: (cm) 2.1 Width: (cm) 2 Depth: (cm) 0.4 Volume: (cm) 1.319 Character of Wound/Ulcer  Post Debridement: Stable Post Procedure Diagnosis Same as Pre-procedure Electronic Signature(s) Signed: 02/08/2017 4:11:02 PM By: Roger Shelter Signed: 02/08/2017 4:32:07 PM By: Worthy Keeler PA-C Entered By: Roger Shelter on 02/08/2017 11:03:58 [place], [name] ([id number]) -------------------------------------------------------------------------------- HPI Details Patient Name: [name] Date of Service: 02/08/2017 10:15 AM Medical Record Number: [id number] Patient Account Number: [id number] Date of Birth/Sex: 1965-11-14 (52 y.o. Female) Treating RN: Roger Shelter Primary Care Provider: Jonna Clark Other Clinician: Referring Provider: Jonna Clark Treating Provider/Extender: Melburn Hake, HOYT Weeks in Treatment: 3 History of Present Illness HPI Description: 01/12/17; this is a 52 year old nondiabetic remote ex-smoker. She tells me that sometime in early November she developed a sudden painful area on her right medial lower leg while she was in bed at night she reached down and scratch the area and woke up in the morning with a scab small wound. This gradually got worse over time. She was seen in the ER on 12/18/16. At that point the possibility of an insect bite/brown recluse spider bite was brought up. At that point it was 1 cm eschar covered wound. She was treated with Bactrim DS. She was back in the ER on 01/09/17 with increasing size pain of the wound. At this point the patient states it was covered with a black eschar at surface. An x-ray showed punctate radio density material within the wound but no osteomyelitis. 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 She was put on clindamycin. The patient was seen on 12/7 by vascular surgery. At that point noting a 3 x 3 cm necrotic wound. ABIs were ordered. She was treated with Silvadene cream. I don't see the results of the ABIs at this point. She is using Silvadene cream. A comprehensive metabolic panel CBC and  differential PT and PTT were done which were also normal. 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 COPD/asthma/depression and a history of atrial fib. She tells me she had a history of vein stripping many years ago ABIs in our clinic were noncompressible  on the right at 1.59 01/19/17; right medial lower leg wound. Small wound with some depth. Still requiring debridement we are using Santyl. She has vascular studies on January 9 but at this point I can't tell whether these are arterial venous or both. They are at [facility] 01/27/16; right medial lower leg wound. She has arterial and venous studies later this month. The wound looks somewhat better after debridement. We've been using Santyl. 02/08/17 on evaluation today patient has had for arterial studies performed. This showed noncompressible measurements in regard to the ABI although she did have tried phasic flow and it stated in the report that she did have good TBI's. With that being said I see no documentation of TBI's and I cannot find the final report in epic only see what the patient brought in to show me today. We obviously are going to need to contact the office to see about getting the final report. We have been using Santyl and this does seem to be helping the wound to a degree. Patient is having no significant pain. Electronic Signature(s) Signed: 02/08/2017 4:32:07 PM By: Worthy Keeler PA-C Entered By: Worthy Keeler on 02/08/2017 12:50:20 [place], [name] ([id number]) -------------------------------------------------------------------------------- Physical Exam Details Patient Name: [name] Date of Service: 02/08/2017 10:15 AM Medical Record Number: [id number] Patient Account Number: [id number] Date of Birth/Sex: 1965/03/21 (52 y.o. Female) Treating RN: Roger Shelter Primary Care Provider: Jonna Clark Other Clinician: Referring Provider: Jonna Clark Treating  Provider/Extender: STONE III, HOYT Weeks in Treatment: 3 Constitutional Well-nourished and well-hydrated in no acute distress. Respiratory normal breathing without difficulty. Psychiatric this patient is able to make decisions and demonstrates good insight into disease process. Alert and Oriented x 3. pleasant and cooperative. Notes On evaluation today patient does have slough covering her wound bed which did require sharp debridement today. Otherwise there does not appear to be any evidence of infection which is excellent news. I did perform sharp debridement today utilizing a care at and patient tolerated this well without complication. Post debridement the wound did look better although there is still work to do in regard to completely clearing the slough of good granulation to start working its way in. Electronic Signature(s) Signed: 02/08/2017 4:32:07 PM By: Worthy Keeler PA-C Entered By: Worthy Keeler on 02/08/2017 12:51:10 [place], [name] ([id number]) -------------------------------------------------------------------------------- Physician Orders Details Patient Name: [name] Date of Service: 02/08/2017 10:15 AM Medical Record Number: [id number] Patient Account Number: [id number] Date of Birth/Sex: 1965/05/02 (52 y.o. Female) Treating RN: Roger Shelter Primary Care Provider: Jonna Clark Other Clinician: Referring Provider: Jonna Clark Treating Provider/Extender: Melburn Hake, HOYT Weeks in Treatment: 3 Verbal / Phone Orders: No Diagnosis Coding ICD-10 Coding Code Description [phone] Non-pressure chronic ulcer of right calf with necrosis of muscle I87.331 Chronic venous hypertension (idiopathic) with ulcer and inflammation of right lower extremity L03.115 Cellulitis of right lower limb Wound Cleansing Wound #1 Right,Anterior Lower Leg o Clean wound with Normal Saline. Anesthetic (add to Medication List) Wound #1 Right,Anterior Lower Leg o Topical Lidocaine  4% cream applied to wound bed prior to debridement (In Clinic Only). Primary Wound Dressing Wound #1 Right,Anterior Lower Leg o Santyl Ointment o Other: - silvercell over santyl on wound Secondary Dressing Wound #1 Right,Anterior Lower Leg o Boardered Foam Dressing Dressing Change Frequency Wound #1 Right,Anterior Lower Leg o Change dressing every day. Follow-up Appointments o Return Appointment in 1 week. Electronic Signature(s) Signed: 02/08/2017 4:11:02 PM By: Roger Shelter Signed: 02/08/2017 4:32:07 PM By: Melburn Hake,  Hoyt PA-C Entered By: Roger Shelter on 02/08/2017 11:11:04 [name] ([id number]) -------------------------------------------------------------------------------- Problem List Details Patient Name: [name], [name] Date of Service: 02/08/2017 10:15 AM Medical Record Number: [id number] Patient Account Number: [id number] Date of Birth/Sex: [birth year] (52 y.o. Female) Treating RN: Roger Shelter Primary Care Provider: Jonna Clark Other Clinician: Referring Provider: Jonna Clark Treating Provider/Extender: Melburn Hake, HOYT Weeks in Treatment: 3 Active Problems ICD-10 Encounter Code Description Active Date Diagnosis L97.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 01/12/2017 Yes I87.331 Chronic venous hypertension (idiopathic) with ulcer and 01/12/2017 Yes inflammation of right lower extremity L03.115 Cellulitis of right lower limb 01/12/2017 Yes Inactive Problems Resolved Problems Electronic Signature(s) Signed: 02/08/2017 4:32:07 PM By: Worthy Keeler PA-C Entered By: Worthy Keeler on 02/08/2017 10:51:40 [place], [name] ([id number]) -------------------------------------------------------------------------------- Progress Note Details Patient Name: [name] Date of Service: 02/08/2017 10:15 AM Medical Record Number: [id number] Patient Account Number: [id number] Date of Birth/Sex: [birth year] (52 y.o.  Female) Treating RN: Roger Shelter Primary Care Provider: Jonna Clark Other Clinician: Referring Provider: Jonna Clark Treating Provider/Extender: Melburn Hake, HOYT Weeks in Treatment: 3 Subjective Chief Complaint Information obtained from Patient Right LE Ulcer History of Present Illness (HPI) 01/12/17; this is a 52 year old nondiabetic remote ex-smoker. She tells me that sometime in early November she developed a sudden painful area on her right medial lower leg while she was in bed at night she reached down and scratch the area and woke up in the morning with a scab small wound. This gradually got worse over time. She was seen in the ER on 12/18/16. At that point the possibility of an insect bite/brown recluse spider bite was brought up. At that point it was 1 cm eschar covered wound. She was treated with Bactrim DS. She was back in the ER on 01/09/17 with increasing size pain of the wound. At this point the patient states it was covered with a black eschar at surface. An x-ray showed punctate radio density material within the wound but no osteomyelitis. 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 She was put on clindamycin. The patient was seen on 12/7 by vascular surgery. At that point noting a 3 x 3 cm necrotic wound. ABIs were ordered. She was treated with Silvadene cream. I don't see the results of the ABIs at this point. She is using Silvadene cream. A comprehensive metabolic panel CBC and differential PT and PTT were done which were also normal. 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 COPD/asthma/depression and a history of atrial fib. She tells me she had a history of vein stripping many years ago ABIs in our clinic were noncompressible on the right at 1.59 01/19/17; right medial lower leg wound. Small wound with some depth. Still requiring debridement we are using Santyl. She has vascular studies on January 9 but at this point I  can't tell whether these are arterial venous or both. They are at [facility] 01/27/16; right medial lower leg wound. She has arterial and venous studies later this month. The wound looks somewhat better after debridement. We've been using Santyl. 02/08/17 on evaluation today patient has had for arterial studies performed. This showed noncompressible measurements in regard to the ABI although she did have tried phasic flow and it stated in the report that she did have good TBI's. With that being said I see no documentation of TBI's and I cannot find the final report in epic only see what the patient brought  in to show me today. We obviously are going to need to contact the office to see about getting the final report. We have been using Santyl and this does seem to be helping the wound to a degree. Patient is having no significant pain. Patient History Information obtained from Patient. Family History Cancer - Siblings, No family history of Diabetes, Heart Disease, Hypertension, Kidney Disease, Lung Disease, Seizures, Stroke, Thyroid Problems, Tuberculosis. Social History Former smoker - stopped 6 years ago, Marital Status - Separated, Alcohol Use - Rarely, Drug Use - No History, Caffeine Use - Moderate. [place], [name] ([id number]) Review of Systems (ROS) Constitutional Symptoms (General Health) Denies complaints or symptoms of Fever, Chills. Respiratory The patient has no complaints or symptoms. Cardiovascular Complains or has symptoms of LE edema. Psychiatric The patient has no complaints or symptoms. Objective Constitutional Well-nourished and well-hydrated in no acute distress. Vitals Time Taken: 10:25 AM, Height: 70 in, Weight: 225 lbs, BMI: 32.3, Temperature: 98.3 F, Pulse: 70 bpm, Respiratory Rate: 16 breaths/min, Blood Pressure: 148/100 mmHg. Respiratory normal breathing without difficulty. Psychiatric this patient is able to make decisions and demonstrates  good insight into disease process. Alert and Oriented x 3. pleasant and cooperative. General Notes: On evaluation today patient does have slough covering her wound bed which did require sharp debridement today. Otherwise there does not appear to be any evidence of infection which is excellent news. I did perform sharp debridement today utilizing a care at and patient tolerated this well without complication. Post debridement the wound did look better although there is still work to do in regard to completely clearing the slough of good granulation to start working its way in. Integumentary (Hair, Skin) Wound #1 status is Open. Original cause of wound was Trauma. The wound is located on the Right,Anterior Lower Leg. The wound measures 2.1cm length x 2cm width x 0.3cm depth; 3.299cm^2 area and 0.99cm^3 volume. There is Fat Layer (Subcutaneous Tissue) Exposed exposed. There is no tunneling or undermining noted. There is a large amount of purulent drainage noted. The wound margin is flat and intact. There is medium (34-66%) red granulation within the wound bed. There is a medium (34-66%) amount of necrotic tissue within the wound bed including Eschar and Adherent Slough. The periwound skin appearance exhibited: Excoriation, Maceration, Hemosiderin Staining. The periwound skin appearance did not exhibit: Callus, Crepitus, Induration, Rash, Scarring, Dry/Scaly, Atrophie Blanche, Cyanosis, Ecchymosis, Mottled, Pallor, Rubor, Erythema. Periwound temperature was noted as No Abnormality. The periwound has tenderness on palpation. Assessment [name], [name] ([id number]) Active Problems ICD-10 [phone] - Non-pressure chronic ulcer of right calf with necrosis of muscle I87.331 - Chronic venous hypertension (idiopathic) with ulcer and inflammation of right lower extremity L03.115 - Cellulitis of right lower limb Procedures Wound #1 Pre-procedure diagnosis of Wound #1 is a Trauma, Other located on the  Right,Anterior Lower Leg . There was a Skin/Subcutaneous Tissue Debridement ([id number]) debridement with total area of 4.2 sq cm performed by STONE III, HOYT E., PA-C. with the following instrument(s): Curette to remove Viable and Non-Viable tissue/material including Fibrin/Slough, Skin, and Subcutaneous after achieving pain control using Other (lidocaine 4%). A time out was conducted at 11:01, prior to the start of the procedure. A Moderate amount of bleeding was controlled with Pressure. The procedure was tolerated well with a pain level of 2 throughout and a pain level of 0 following the procedure. Post Debridement Measurements: 2.1cm length x 2cm width x 0.4cm depth; 1.319cm^3 volume. Character of Wound/Ulcer Post Debridement is stable. Post procedure  Diagnosis Wound #1: Same as Pre-Procedure Plan Wound Cleansing: Wound #1 Right,Anterior Lower Leg: Clean wound with Normal Saline. Anesthetic (add to Medication List): Wound #1 Right,Anterior Lower Leg: Topical Lidocaine 4% cream applied to wound bed prior to debridement (In Clinic Only). Primary Wound Dressing: Wound #1 Right,Anterior Lower Leg: Santyl Ointment Other: - silvercell over santyl on wound Secondary Dressing: Wound #1 Right,Anterior Lower Leg: Boardered Foam Dressing Dressing Change Frequency: Wound #1 Right,Anterior Lower Leg: Change dressing every day. Follow-up Appointments: Return Appointment in 1 week. [place], [name] ([id number]) I'm going to suggest that we continue with the Santyl patient is in agreement with this plan. However one thing we will do is add a silver alginate dressing to cover the wound as this can be beneficial as far as helping with the excess drainage she has had to change this multiple times a day she tells me I'm hopeful this will prevent that from being a necessity especially considering the Santyl use. We will see were things stand in one weeks time. Please see above for specific wound care  orders. We will see patient for re-evaluation in 1 week(s) here in the clinic. If anything worsens or changes patient will contact our office for additional recommendations. Electronic Signature(s) Signed: 02/08/2017 4:32:07 PM By: Worthy Keeler PA-C Entered By: Worthy Keeler on 02/08/2017 12:52:00 [place], [name] ([id number]) -------------------------------------------------------------------------------- ROS/PFSH Details Patient Name: [name] Date of Service: 02/08/2017 10:15 AM Medical Record Number: [id number] Patient Account Number: [id number] Date of Birth/Sex: 1965/04/18 (53 y.o. Female) Treating RN: Roger Shelter Primary Care Provider: Jonna Clark Other Clinician: Referring Provider: Jonna Clark Treating Provider/Extender: Melburn Hake, HOYT Weeks in Treatment: 3 Information Obtained From Patient Wound History Do you currently have one or more open woundso Yes How many open wounds do you currently haveo 1 Approximately how long have you had your woundso one month How have you been treating your wound(s) until nowo 3 weeks Has your wound(s) ever healed and then re-openedo Yes Have you had any lab work done in the past montho Yes Have you tested positive for an antibiotic resistant organism (MRSA, VRE)o No Have you tested positive for osteomyelitis (bone infection)o No Have you had any tests for circulation on your legso Yes Where was the test doneo [facility] Have you had other problems associated with your woundso Infection Constitutional Symptoms (General Health) Complaints and Symptoms: Negative for: Fever; Chills Cardiovascular Complaints and Symptoms: Positive for: LE edema Medical History: Positive for: Arrhythmia; Congestive Heart Failure; Deep Vein Thrombosis - in lower legs and history of varicose veins; Hypertension Negative for: Angina; Coronary Artery Disease; Hypotension; Myocardial Infarction; Peripheral Arterial Disease; Peripheral Venous  Disease; Phlebitis; Vasculitis Eyes Medical History: Negative for: Cataracts; Glaucoma; Optic Neuritis Ear/Nose/Mouth/Throat Medical History: Negative for: Chronic sinus problems/congestion; Middle ear problems Hematologic/Lymphatic Medical History: Negative for: Anemia; Hemophilia; Human Immunodeficiency Virus; Lymphedema; Sickle Cell Disease Respiratory [name], [name] ([id number]) Complaints and Symptoms: No Complaints or Symptoms Medical History: Positive for: Chronic Obstructive Pulmonary Disease (COPD); Sleep Apnea Negative for: Aspiration; Asthma; Pneumothorax; Tuberculosis Gastrointestinal Medical History: Negative for: Cirrhosis ; Colitis; Crohnos; Hepatitis A; Hepatitis B; Hepatitis C Endocrine Medical History: Negative for: Type I Diabetes; Type II Diabetes Genitourinary Medical History: Negative for: End Stage Renal Disease Immunological Medical History: Negative for: Lupus Erythematosus; Raynaudos; Scleroderma Integumentary (Skin) Medical History: Negative for: History of Burn; History of pressure wounds Musculoskeletal Medical History: Positive for: Gout; Rheumatoid Arthritis; Osteoarthritis Negative for: Osteomyelitis Neurologic Medical History: Negative for: Dementia; Neuropathy; Quadriplegia; Paraplegia; Seizure  Disorder Oncologic Medical History: Negative for: Received Chemotherapy; Received Radiation Psychiatric Complaints and Symptoms: No Complaints or Symptoms Medical History: Positive for: Confinement Anxiety Negative for: Anorexia/bulimia Immunizations Pneumococcal Vaccine: [name], [name] ([id number]) Received Pneumococcal Vaccination: No Implantable Devices Family and Social History Cancer: Yes - Siblings; Diabetes: No; Heart Disease: No; Hypertension: No; Kidney Disease: No; Lung Disease: No; Seizures: No; Stroke: No; Thyroid Problems: No; Tuberculosis: No; Former smoker - stopped 6 years ago; Marital Status - Separated; Alcohol Use:  Rarely; Drug Use: No History; Caffeine Use: Moderate; Financial Concerns: No; Food, Clothing or Shelter Needs: No; Support System Lacking: No; Transportation Concerns: No; Advanced Directives: No; Patient does not want information on Advanced Directives; Do not resuscitate: No; Living Will: No; Medical Power of Attorney: No Physician Affirmation I have reviewed and agree with the above information. Electronic Signature(s) Signed: 02/08/2017 4:11:02 PM By: Roger Shelter Signed: 02/08/2017 4:32:07 PM By: Worthy Keeler PA-C Entered By: Worthy Keeler on 02/08/2017 12:50:45 [place], [name] ([id number]) -------------------------------------------------------------------------------- SuperBill Details Patient Name: [name] Date of Service: 02/08/2017 Medical Record Number: [id number] Patient Account Number: [id number] Date of Birth/Sex: 1965-08-10 (52 y.o. Female) Treating RN: Roger Shelter Primary Care Provider: Jonna Clark Other Clinician: Referring Provider: Jonna Clark Treating Provider/Extender: Melburn Hake, HOYT Weeks in Treatment: 3 Diagnosis Coding ICD-10 Codes Code Description [phone] Non-pressure chronic ulcer of right calf with necrosis of muscle I87.331 Chronic venous hypertension (idiopathic) with ulcer and inflammation of right lower extremity L03.115 Cellulitis of right lower limb Facility Procedures CPT4 Code: [id number] Description: 42595 - DEB SUBQ TISSUE 20 SQ CM/< ICD-10 Diagnosis Description L97.213 Non-pressure chronic ulcer of right calf with necrosis of m Modifier: uscle Quantity: 1 Physician Procedures CPT4 Code: [id number] Description: 33295 - WC PHYS SUBQ TISS 20 SQ CM ICD-10 Diagnosis Description J88.416 Non-pressure chronic ulcer of right calf with necrosis of m Modifier: uscle Quantity: 1 Electronic Signature(s) Signed: 02/08/2017 4:32:07 PM By: Worthy Keeler PA-C Entered By: Worthy Keeler on 02/08/2017 12:52:14

## 2017-02-15 ENCOUNTER — Ambulatory Visit: Payer: Medicaid Other | Admitting: Physician Assistant

## 2017-02-19 ENCOUNTER — Emergency Department
Admission: EM | Admit: 2017-02-19 | Discharge: 2017-02-19 | Disposition: A | Discharge status: Home / Self Care | Payer: Medicaid Other | Attending: Emergency Medicine | Admitting: Emergency Medicine

## 2017-02-19 ENCOUNTER — Other Ambulatory Visit: Payer: Self-pay

## 2017-02-19 ENCOUNTER — Emergency Department: Payer: Medicaid Other

## 2017-02-19 ENCOUNTER — Encounter: Payer: Self-pay | Admitting: Intensive Care

## 2017-02-19 DIAGNOSIS — M109 Gout, unspecified: Secondary | ICD-10-CM | POA: Insufficient documentation

## 2017-02-19 DIAGNOSIS — J449 Chronic obstructive pulmonary disease, unspecified: Secondary | ICD-10-CM | POA: Insufficient documentation

## 2017-02-19 DIAGNOSIS — M1000 Idiopathic gout, unspecified site: Secondary | ICD-10-CM

## 2017-02-19 DIAGNOSIS — Z7982 Long term (current) use of aspirin: Secondary | ICD-10-CM | POA: Diagnosis not present

## 2017-02-19 DIAGNOSIS — Z87891 Personal history of nicotine dependence: Secondary | ICD-10-CM | POA: Insufficient documentation

## 2017-02-19 DIAGNOSIS — I110 Hypertensive heart disease with heart failure: Secondary | ICD-10-CM | POA: Diagnosis not present

## 2017-02-19 DIAGNOSIS — Z79899 Other long term (current) drug therapy: Secondary | ICD-10-CM | POA: Insufficient documentation

## 2017-02-19 DIAGNOSIS — M79662 Pain in left lower leg: Secondary | ICD-10-CM | POA: Diagnosis present

## 2017-02-19 DIAGNOSIS — I5032 Chronic diastolic (congestive) heart failure: Secondary | ICD-10-CM | POA: Insufficient documentation

## 2017-02-19 DIAGNOSIS — M79605 Pain in left leg: Secondary | ICD-10-CM

## 2017-02-19 LAB — CBC WITH DIFFERENTIAL/PLATELET
BASOS PCT: 1 %
Basophils Absolute: 0.1 10*3/uL (ref 0–0.1)
EOS ABS: 0.1 10*3/uL (ref 0–0.7)
EOS PCT: 1 %
HCT: 37.6 % (ref 35.0–47.0)
HEMOGLOBIN: 12.3 g/dL (ref 12.0–16.0)
LYMPHS ABS: 1.8 10*3/uL (ref 1.0–3.6)
Lymphocytes Relative: 17 %
MCH: 29.2 pg (ref 26.0–34.0)
MCHC: 32.7 g/dL (ref 32.0–36.0)
MCV: 89.3 fL (ref 80.0–100.0)
Monocytes Absolute: 1 10*3/uL — ABNORMAL HIGH (ref 0.2–0.9)
Monocytes Relative: 10 %
Neutro Abs: 7.3 10*3/uL — ABNORMAL HIGH (ref 1.4–6.5)
Neutrophils Relative %: 71 %
PLATELETS: 268 10*3/uL (ref 150–440)
RBC: 4.21 MIL/uL (ref 3.80–5.20)
RDW: 15.2 % — ABNORMAL HIGH (ref 11.5–14.5)
WBC: 10.2 10*3/uL (ref 3.6–11.0)

## 2017-02-19 LAB — COMPREHENSIVE METABOLIC PANEL
ALK PHOS: 61 U/L (ref 38–126)
ALT: 10 U/L — AB (ref 14–54)
AST: 19 U/L (ref 15–41)
Albumin: 3.7 g/dL (ref 3.5–5.0)
Anion gap: 10 (ref 5–15)
BILIRUBIN TOTAL: 0.9 mg/dL (ref 0.3–1.2)
BUN: 10 mg/dL (ref 6–20)
CALCIUM: 8.7 mg/dL — AB (ref 8.9–10.3)
CO2: 26 mmol/L (ref 22–32)
CREATININE: 0.73 mg/dL (ref 0.44–1.00)
Chloride: 107 mmol/L (ref 101–111)
GFR calc Af Amer: >60 mL/min (ref >60)
Glucose, Bld: 90 mg/dL (ref 65–99)
Potassium: 3.3 mmol/L — ABNORMAL LOW (ref 3.5–5.1)
Sodium: 143 mmol/L (ref 135–145)
TOTAL PROTEIN: 7 g/dL (ref 6.5–8.1)

## 2017-02-19 LAB — URIC ACID: URIC ACID, SERUM: 10.3 mg/dL — AB (ref 2.3–6.6)

## 2017-02-19 MED ORDER — KETOROLAC TROMETHAMINE 30 MG/ML IJ SOLN
30.0000 mg | Freq: Once | INTRAMUSCULAR | Status: AC
  Administered 2017-02-19: 30 mg via INTRAVENOUS

## 2017-02-19 MED ORDER — NAPROXEN 500 MG PO TABS: 500.0000 mg | ORAL_TABLET | Freq: Two times a day (BID) | ORAL | 2 refills | Status: DC

## 2017-02-19 MED ORDER — KETOROLAC TROMETHAMINE 30 MG/ML IJ SOLN
INTRAMUSCULAR | Status: AC
  Administered 2017-02-19: 30 mg via INTRAVENOUS
  Filled 2017-02-19: qty 1

## 2017-02-19 NOTE — Discharge Instructions (Signed)
please return for worse pain fever or increasing redness or feeling sick. Please take the Naprosyn with food twice a day. You should start to feel a lot better within 2 days or so. If you do not return here or see your doctor. do not take the Naprosyn for more than a week. I have given U2 refills he can try if the gout flares up later but again do not take the Naprosyn for more than a week. She you take your blood pressure medicine as soon as you get home.

## 2017-02-19 NOTE — ED Triage Notes (Signed)
Patient reports pain in L foot that she believes is gout. HX gout

## 2017-02-19 NOTE — ED Notes (Signed)
Patient transported to Ultrasound 

## 2017-02-19 NOTE — ED Notes (Signed)
Ed provider made aware of patient BP

## 2017-02-19 NOTE — ED Notes (Signed)
Returned from [facility]

## 2017-02-19 NOTE — ED Notes (Signed)
Pt refused repeat Blood pressure check.

## 2017-02-19 NOTE — ED Notes (Signed)
Patient reports left foot and calf pain, calf pain high on calf.

## 2017-02-19 NOTE — ED Provider Notes (Signed)
[facility] Emergency Department Provider Note   ____________________________________________   First MD Initiated Contact with Patient 02/19/17 0805     (approximate)  I have reviewed the triage vital signs and the nursing notes.   HISTORY  Chief Complaint Gout   HPI [name] is a 52 y.o. female Patient complains of pain in the left foot and ankle for about a week it flared up a lot today. She is having trouble walking because it hurts to much she also complains of pain in the calf. That started today. She had a recent ankle-brachial index done last week she's going to the wound clinic for a wound in the right medial calf. She reports it's healing well and is not bothering her. Patient broke her ankle and has hardware in place from 2003. She says this feels like her gouty pain. She's had it there before.the pain has been as I noted progressive and is now fairly severe.patient reports she did not take her blood pressure medication this morning.   Past Medical History:  Diagnosis Date  . A-fib ([place]) 2010  . CHF (congestive heart failure) ([place])   . COPD (chronic obstructive pulmonary disease) ([place])   . Gout   . Hypertension   . Pulmonary embolus ([place])   . Stroke [facility])     Patient Active Problem List   Diagnosis Date Noted  . Bug bite 12/31/2016  . Hyperlipidemia 12/31/2016  . Essential hypertension 05/26/2016  . Chronic diastolic heart failure ([place]) 05/26/2016  . Hypokalemia 05/26/2016    Past Surgical History:  Procedure Laterality Date  . bilateral wrist fractures  2010  . lt ankle fracture  2002    Prior to Admission medications   Medication Sig Start Date End Date Taking? Authorizing Provider  amLODipine (NORVASC) 10 MG tablet Take 10 mg by mouth daily.    [provider]  aspirin EC 81 MG tablet Take 81 mg by mouth daily.    [provider]  atorvastatin (LIPITOR) 20 MG tablet Take 20 mg by mouth daily.     [provider]  cloNIDine (CATAPRES) 0.1 MG tablet Take 0.1 mg by mouth 2 (two) times daily.    [provider]  diphenhydrAMINE (BENADRYL) 25 MG tablet Take 25 mg by mouth every 6 (six) hours as needed.    [provider]  furosemide (LASIX) 40 MG tablet Take 1 tablet (40 mg total) by mouth 2 (two) times daily. 06/15/16 09/13/16  Alisa Graff, FNP  hydrALAZINE (APRESOLINE) 50 MG tablet Take 50 mg by mouth 3 (three) times daily.    [provider]  lisinopril (PRINIVIL,ZESTRIL) 40 MG tablet Take 40 mg by mouth daily.    [provider]  metoCLOPramide (REGLAN) 10 MG tablet Take 1 tablet (10 mg total) by mouth every 8 (eight) hours as needed for nausea. 04/01/15 06/14/16  Loney Hering, MD  metoprolol succinate (TOPROL-XL) 100 MG 24 hr tablet Take 100 mg by mouth daily. Take with or immediately following a meal.    [provider]  ondansetron (ZOFRAN) 4 MG tablet Take 1 tablet (4 mg total) by mouth daily as needed for nausea or vomiting. 07/22/15   Lavonia Drafts, MD  potassium chloride SA (KLOR-CON M20) 20 MEQ tablet Take 1 tablet (20 mEq total) by mouth 2 (two) times daily. 06/03/16   Alisa Graff, FNP  sertraline (ZOLOFT) 100 MG tablet Take 100 mg by mouth daily.    [provider]  silver sulfADIAZINE (SILVADENE) 1 % cream Apply 1 application topically daily. 12/31/16   Stegmayer, Janalyn Harder, PA-C  spironolactone (ALDACTONE) 25 MG tablet Take 50 mg by mouth daily.     [provider]    Allergies Morphine and related and Penicillins  Family History  Problem Relation Age of Onset  . Prostate cancer Father   . Rectal cancer Sister   . Breast cancer Sister     Social History Social History   Tobacco Use  . Smoking status: Former Smoker    Types: Cigarettes    Last attempt to quit: 01/26/2016    Years since quitting: 1.0  . Smokeless tobacco: Never Used  Substance Use Topics  . Alcohol use: Yes    Comment:  occ  . Drug use: No    Review of Systems  Constitutional: No fever/chills Eyes: No visual changes. ENT: No sore throat. Cardiovascular: Denies chest pain. Respiratory: Denies shortness of breath. Gastrointestinal: No abdominal pain.  No nausea, no vomiting.  No diarrhea.  No constipation. Genitourinary: Negative for dysuria. Musculoskeletal: Negative for back pain. Skin: Negative for rash. Neurological: Negative for headaches, focal weakness   ____________________________________________   PHYSICAL EXAM:  VITAL SIGNS: ED Triage Vitals  Enc Vitals Group     BP 02/19/17 0742 (!) 194/131     Pulse Rate 02/19/17 0742 83     Resp 02/19/17 0742 16     Temp 02/19/17 0742 98.2 F (36.8 C)     Temp Source 02/19/17 0742 Oral     SpO2 02/19/17 0742 96 %     Weight 02/19/17 0741 225 lb (102.1 kg)     Height 02/19/17 0741 5\' 10"  (1.778 m)     Head Circumference --      Peak Flow --      Pain Score 02/19/17 0741 10     Pain Loc --      Pain Edu? --      Excl. in [place]? --     Constitutional: Alert and oriented. Well appearing and in no acute distress. Eyes: Conjunctivae are normal.  Head: Atraumatic. Nose: No congestion/rhinnorhea. Mouth/Throat: Mucous membranes are moist.  Oropharynx non-erythematous. Neck: No stridor.  Cardiovascular: Normal rate, regular rhythm. Grossly normal heart sounds.  Good peripheral circulation. Respiratory: Normal respiratory effort.  No retractions. Lungs CTAB. Gastrointestinal: Soft and nontender. No distention. No abdominal bruits. No CVA tenderness. Musculoskeletal: No lower extremity tenderness nor edema.  No joint effusions.ankle is not red is not warmer than the other ankle it is tender to try to move it. She complains of pain on palpation of the whole foot on the foot is not red or warm either. Neurologic:  Normal speech and language. No gross focal neurologic deficits are appreciated. No gait instability. Skin:  Skin is warm, dry and intact.  No rash noted.   ____________________________________________   LABS (all labs ordered are listed, but only abnormal results are displayed)  Labs Reviewed  URIC ACID - Abnormal; Notable for the following components:      Result Value   Uric Acid, Serum 10.3 (*)    All other components within normal limits  COMPREHENSIVE METABOLIC PANEL - Abnormal; Notable for the following components:   Potassium 3.3 (*)    Calcium 8.7 (*)    ALT 10 (*)    All other components within normal limits  CBC WITH DIFFERENTIAL/PLATELET - Abnormal; Notable for the following components:   RDW 15.2 (*)    Neutro Abs 7.3 (*)  Monocytes Absolute 1.0 (*)    All other components within normal limits   ____________________________________________  EKG   ____________________________________________  RADIOLOGY  Dg Ankle Complete Left  Result Date: 02/19/2017 CLINICAL DATA:  Recent onset left ankle pain and patient with a history of gout. No recent injury. EXAM: LEFT ANKLE COMPLETE - 3+ VIEW COMPARISON:  None. FINDINGS: The patient is status post fixation of healed medial and lateral malleolar fractures. Hardware is intact. No acute bony or joint abnormality is identified. No joint effusion. No evidence of arthropathy. Soft tissues are unremarkable. IMPRESSION: No acute abnormality or finding to explain the patient's symptoms. Healed medial and lateral malleolar fractures with fixation hardware in place. Electronically Signed   By: Inge Rise M.D.   On: 02/19/2017 08:59   US Venous Img Lower Unilateral Left  Result Date: 02/19/2017 CLINICAL DATA:  Left leg pain EXAM: LEFT LOWER EXTREMITY VENOUS DOPPLER ULTRASOUND TECHNIQUE: Gray-scale sonography with graded compression, as well as color Doppler and duplex ultrasound were performed to evaluate the lower extremity deep venous systems from the level of the common femoral vein and including the common femoral, femoral, profunda femoral, popliteal and calf  veins including the posterior tibial, peroneal and gastrocnemius veins when visible. The superficial great saphenous vein was also interrogated. Spectral Doppler was utilized to evaluate flow at rest and with distal augmentation maneuvers in the common femoral, femoral and popliteal veins. COMPARISON:  None. FINDINGS: Contralateral Common Femoral Vein: Respiratory phasicity is normal and symmetric with the symptomatic side. No evidence of thrombus. Normal compressibility. Common Femoral Vein: No evidence of thrombus. Normal compressibility, respiratory phasicity and response to augmentation. Saphenofemoral Junction: No evidence of thrombus. Normal compressibility and flow on color Doppler imaging. Profunda Femoral Vein: No evidence of thrombus. Normal compressibility and flow on color Doppler imaging. Femoral Vein: No evidence of thrombus. Normal compressibility, respiratory phasicity and response to augmentation. Popliteal Vein: No evidence of thrombus. Normal compressibility, respiratory phasicity and response to augmentation. Calf Veins: No evidence of thrombus. Normal compressibility and flow on color Doppler imaging. Superficial Great Saphenous Vein: No evidence of thrombus. Normal compressibility. Venous Reflux:  None. Other Findings:  None. IMPRESSION: No evidence of deep venous thrombosis. Electronically Signed   By: Inez Catalina M.D.   On: 02/19/2017 09:44   ankle shows no acute disease is no DVTs in the ultrasound ____________________________________________   PROCEDURES  Procedure(s) performed:   Procedures  Critical Care performed:   ____________________________________________   INITIAL IMPRESSION / ASSESSMENT AND PLAN / ED COURSE  Ankle seems to be having a gouty flare. The patient's Sherrick acid is elevated. The ankle is not red or hot though. Patient does say it feels like it has felt before. We will try some Naprosyn to see if this helps the flare. I will have her return or  follow-up with her doctor if she is worse or not any better in a couple days. Her white count is not elevated and there is no evidence of any inflammatory changes in the ankle      ____________________________________________   FINAL CLINICAL IMPRESSION(S) / ED DIAGNOSES  Final diagnoses:  Left leg pain     ED Discharge Orders    None       Note:  This document was prepared using Dragon voice recognition software and may include unintentional dictation errors.    Nena Polio, MD 02/19/17 1000

## 2017-02-19 NOTE — ED Notes (Signed)
FIRST NURSE NOTE:  Pt states "I think I have the gout" pt states it is in her toe on the left with calf pain.

## 2017-02-24 ENCOUNTER — Encounter: Payer: Medicaid Other | Admitting: Nurse Practitioner

## 2017-02-24 DIAGNOSIS — I87331 Chronic venous hypertension (idiopathic) with ulcer and inflammation of right lower extremity: Secondary | ICD-10-CM | POA: Diagnosis not present

## 2017-02-26 NOTE — Progress Notes (Signed)
[name] ([id number]) Visit Report for 02/24/2017 Chief Complaint Document Details Patient Name: [name] Date of Service: 02/24/2017 2:45 PM Medical Record Number: [id number] Patient Account Number: [id number] Date of Birth/Sex: 02-Oct-1965 (52 y.o. Female) Treating RN: Roger Shelter Primary Care Provider: Jonna Clark Other Clinician: Referring Provider: Jonna Clark Treating Provider/Extender: Cathie Olden in Treatment: 6 Information Obtained from: Patient Chief Complaint Right LE Ulcer Electronic Signature(s) Signed: 02/24/2017 5:46:58 PM By: Lawanda Cousins Entered By: Lawanda Cousins on 02/24/2017 16:25:06 [name] ([id number]) -------------------------------------------------------------------------------- Debridement Details Patient Name: [name] Date of Service: 02/24/2017 2:45 PM Medical Record Number: [id number] Patient Account Number: [id number] Date of Birth/Sex: 1965-06-04 (52 y.o. Female) Treating RN: Roger Shelter Primary Care Provider: Jonna Clark Other Clinician: Referring Provider: Jonna Clark Treating Provider/Extender: Cathie Olden in Treatment: 6 Debridement Performed for Wound #1 Right,Anterior Lower Leg Assessment: Performed By: Physician Lawanda Cousins, NP Debridement: Debridement Pre-procedure Verification/Time Yes - 03:20 Out Taken: Start Time: 03:20 Pain Control: Other : lidocaine 4% Level: Skin/Subcutaneous Tissue Total Area Debrided (L x W): 2 (cm) x 1.7 (cm) = 3.4 (cm) Tissue and other material Viable, Non-Viable, Fibrin/Slough, Skin, Subcutaneous debrided: Instrument: Curette Bleeding: Minimum Hemostasis Achieved: Pressure End Time: 03:20 Procedural Pain: 0 Post Procedural Pain: 0 Response to Treatment: Procedure was tolerated well Post Debridement Measurements of Total Wound Length: (cm) 2 Width: (cm) 1.7 Depth: (cm) 0.2 Volume: (cm) 0.534 Character of Wound/Ulcer Post Debridement:  Stable Post Procedure Diagnosis Same as Pre-procedure Electronic Signature(s) Signed: 02/24/2017 4:36:28 PM By: Roger Shelter Signed: 02/24/2017 5:46:58 PM By: Lawanda Cousins Entered By: Lawanda Cousins on 02/24/2017 16:24:54 [name] ([id number]) -------------------------------------------------------------------------------- HPI Details Patient Name: [name] Date of Service: 02/24/2017 2:45 PM Medical Record Number: [id number] Patient Account Number: [id number] Date of Birth/Sex: 1965-09-02 (52 y.o. Female) Treating RN: Roger Shelter Primary Care Provider: Jonna Clark Other Clinician: Referring Provider: Jonna Clark Treating Provider/Extender: Cathie Olden in Treatment: 6 History of Present Illness HPI Description: 01/12/17; this is a 52 year old nondiabetic remote ex-smoker. She tells me that sometime in early November she developed a sudden painful area on her right medial lower leg while she was in bed at night she reached down and scratch the area and woke up in the morning with a scab small wound. This gradually got worse over time. She was seen in the ER on 12/18/16. At that point the possibility of an insect bite/brown recluse spider bite was brought up. At that point it was 1 cm eschar covered wound. She was treated with Bactrim DS. She was back in the ER on 01/09/17 with increasing size pain of the wound. At this point the patient states it was covered with a black eschar at surface. An x-ray showed punctate radio density material within the wound but no osteomyelitis. 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 She was put on clindamycin. The patient was seen on 12/7 by vascular surgery. At that point noting a 3 x 3 cm necrotic wound. ABIs were ordered. She was treated with Silvadene cream. I don't see the results of the ABIs at this point. She is using Silvadene cream. A comprehensive metabolic panel CBC and differential PT and PTT were done which  were also normal. 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 COPD/asthma/depression and a history of atrial fib. She tells me she had a history of vein stripping many years ago ABIs in our clinic were noncompressible on the right at 1.59 01/19/17; right medial lower leg  wound. Small wound with some depth. Still requiring debridement we are using Santyl. She has vascular studies on January 9 but at this point I can't tell whether these are arterial venous or both. They are at [place] [facility] 01/27/16; right medial lower leg wound. She has arterial and venous studies later this month. The wound looks somewhat better after debridement. We've been using Santyl. 02/08/17 on evaluation today patient has had for arterial studies performed. This showed noncompressible measurements in regard to the ABI although she did have tried phasic flow and it stated in the report that she did have good TBI's. With that being said I see no documentation of TBI's and I cannot find the final report in epic only see what the patient brought in to show me today. We obviously are going to need to contact the office to see about getting the final report. We have been using Santyl and this does seem to be helping the wound to a degree. Patient is having no significant pain. 02/24/17-she is here in follow-up evaluation for right lower extremity ulcer. She continues with Santyl. She tolerated debridement we will continue with Santyl, anticipate this will be transitioned in the next week or so; follow up next week Electronic Signature(s) Signed: 02/24/2017 5:46:58 PM By: Lawanda Cousins Entered By: Lawanda Cousins on 02/24/2017 [name] ([id number]) -------------------------------------------------------------------------------- Physician Orders Details Patient Name: [name] Date of Service: 02/24/2017 2:45 PM Medical Record Number: [id number] Patient Account Number:  [id number] Date of Birth/Sex: 1965/07/10 (52 y.o. Female) Treating RN: Roger Shelter Primary Care Provider: Jonna Clark Other Clinician: Referring Provider: Jonna Clark Treating Provider/Extender: Cathie Olden in Treatment: 6 Verbal / Phone Orders: No Diagnosis Coding Wound Cleansing Wound #1 Right,Anterior Lower Leg o Clean wound with Normal Saline. Anesthetic (add to Medication List) Wound #1 Right,Anterior Lower Leg o Topical Lidocaine 4% cream applied to wound bed prior to debridement (In Clinic Only). Primary Wound Dressing Wound #1 Right,Anterior Lower Leg o Santyl Ointment Secondary Dressing Wound #1 Right,Anterior Lower Leg o Boardered Foam Dressing Dressing Change Frequency Wound #1 Right,Anterior Lower Leg o Change dressing every day. Follow-up Appointments o Return Appointment in 1 week. Electronic Signature(s) Signed: 02/24/2017 5:46:58 PM By: Lawanda Cousins Entered By: Lawanda Cousins on 02/24/2017 16:26:38 [name] ([id number]) -------------------------------------------------------------------------------- Problem List Details Patient Name: [name] Date of Service: 02/24/2017 2:45 PM Medical Record Number: [id number] Patient Account Number: [id number] Date of Birth/Sex: 03/01/1965 (52 y.o. Female) Treating RN: Roger Shelter Primary Care Provider: Jonna Clark Other Clinician: Referring Provider: Jonna Clark Treating Provider/Extender: Cathie Olden in Treatment: 6 Active Problems ICD-10 Encounter Code Description Active Date Diagnosis L97.212 Non-pressure chronic ulcer of right calf with fat layer exposed 01/12/2017 Yes I87.331 Chronic venous hypertension (idiopathic) with ulcer and 01/12/2017 Yes inflammation of right lower extremity L03.115 Cellulitis of right lower limb 01/12/2017 Yes Inactive Problems Resolved Problems Electronic Signature(s) Signed: 02/24/2017 5:46:58 PM By: Lawanda Cousins Entered By:  Lawanda Cousins on 02/24/2017 16:27:31 Hannold, Neoma Randall ([id number]) -------------------------------------------------------------------------------- Progress Note Details Patient Name: [name] Date of Service: 02/24/2017 2:45 PM Medical Record Number: [id number] Patient Account Number: [id number] Date of Birth/Sex: 01-Nov-1965 (52 y.o. Female) Treating RN: Roger Shelter Primary Care Provider: Jonna Clark Other Clinician: Referring Provider: Jonna Clark Treating Provider/Extender: Cathie Olden in Treatment: 6 Subjective Chief Complaint Information obtained from Patient Right LE Ulcer History of Present Illness (HPI) 01/12/17; this is a 52 year old nondiabetic remote ex-smoker. She tells me that sometime in early November she developed a  sudden painful area on her right medial lower leg while she was in bed at night she reached down and scratch the area and woke up in the morning with a scab small wound. This gradually got worse over time. She was seen in the ER on 12/18/16. At that point the possibility of an insect bite/brown recluse spider bite was brought up. At that point it was 1 cm eschar covered wound. She was treated with Bactrim DS. She was back in the ER on 01/09/17 with increasing size pain of the wound. At this point the patient states it was covered with a black eschar at surface. An x-ray showed punctate radio density material within the wound but no osteomyelitis. 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 She was put on clindamycin. The patient was seen on 12/7 by vascular surgery. At that point noting a 3 x 3 cm necrotic wound. ABIs were ordered. She was treated with Silvadene cream. I don't see the results of the ABIs at this point. She is using Silvadene cream. A comprehensive metabolic panel CBC and differential PT and PTT were done which were also normal. 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  COPD/asthma/depression and a history of atrial fib. She tells me she had a history of vein stripping many years ago ABIs in our clinic were noncompressible on the right at 1.59 01/19/17; right medial lower leg wound. Small wound with some depth. Still requiring debridement we are using Santyl. She has vascular studies on January 9 but at this point I can't tell whether these are arterial venous or both. They are at [facility] 01/27/16; right medial lower leg wound. She has arterial and venous studies later this month. The wound looks somewhat better after debridement. We've been using Santyl. 02/08/17 on evaluation today patient has had for arterial studies performed. This showed noncompressible measurements in regard to the ABI although she did have tried phasic flow and it stated in the report that she did have good TBI's. With that being said I see no documentation of TBI's and I cannot find the final report in epic only see what the patient brought in to show me today. We obviously are going to need to contact the office to see about getting the final report. We have been using Santyl and this does seem to be helping the wound to a degree. Patient is having no significant pain. 02/24/17-she is here in follow-up evaluation for right lower extremity ulcer. She continues with Santyl. She tolerated debridement we will continue with Santyl, anticipate this will be transitioned in the next week or so; follow up next week Patient History Information obtained from Patient. Family History Cancer - Siblings, No family history of Diabetes, Heart Disease, Hypertension, Kidney Disease, Lung Disease, Seizures, Stroke, Thyroid Problems, Tuberculosis. Social History [name], [name] ([id number]) Former smoker - stopped 6 years ago, Marital Status - Separated, Alcohol Use - Rarely, Drug Use - No History, Caffeine Use - Moderate. Objective Constitutional Vitals Time Taken: 3:00 AM, Height: 70  in, Weight: 225 lbs, BMI: 32.3, Temperature: 98.0 F, Pulse: 89 bpm, Respiratory Rate: 16 breaths/min, Blood Pressure: 168/107 mmHg. Integumentary (Hair, Skin) Wound #1 status is Open. Original cause of wound was Trauma. The wound is located on the Right,Anterior Lower Leg. The wound measures 2cm length x 1.7cm width x 0.2cm depth; 2.67cm^2 area and 0.534cm^3 volume. There is Fat Layer (Subcutaneous Tissue) Exposed exposed. There is no tunneling or undermining noted.  There is a medium amount of serosanguineous drainage noted. The wound margin is flat and intact. There is medium (34-66%) red granulation within the wound bed. There is a medium (34-66%) amount of necrotic tissue within the wound bed including Eschar and Adherent Slough. The periwound skin appearance exhibited: Excoriation, Maceration, Hemosiderin Staining. The periwound skin appearance did not exhibit: Callus, Crepitus, Induration, Rash, Scarring, Dry/Scaly, Atrophie Blanche, Cyanosis, Ecchymosis, Mottled, Pallor, Rubor, Erythema. Periwound temperature was noted as No Abnormality. The periwound has tenderness on palpation. Assessment Active Problems ICD-10 L97.212 - Non-pressure chronic ulcer of right calf with fat layer exposed I87.331 - Chronic venous hypertension (idiopathic) with ulcer and inflammation of right lower extremity L03.115 - Cellulitis of right lower limb Procedures Wound #1 Pre-procedure diagnosis of Wound #1 is a Trauma, Other located on the Right,Anterior Lower Leg . There was a Skin/Subcutaneous Tissue Debridement ([id number]) debridement with total area of 3.4 sq cm performed by Lawanda Cousins, NP. with the following instrument(s): Curette to remove Viable and Non-Viable tissue/material including Fibrin/Slough, Skin, and Subcutaneous after achieving pain control using Other (lidocaine 4%). A time out was conducted at 03:20, prior to the start of the procedure. A Minimum amount of bleeding was controlled  with Pressure. The procedure was tolerated well with a pain level of 0 throughout and a pain level of 0 following the procedure. Post Debridement Measurements: 2cm length x 1.7cm width x 0.2cm depth; 0.534cm^3 volume. Character of Wound/Ulcer Post Debridement is stable. [place], Neoma Randall ([id number]) Post procedure Diagnosis Wound #1: Same as Pre-Procedure Plan Wound Cleansing: Wound #1 Right,Anterior Lower Leg: Clean wound with Normal Saline. Anesthetic (add to Medication List): Wound #1 Right,Anterior Lower Leg: Topical Lidocaine 4% cream applied to wound bed prior to debridement (In Clinic Only). Primary Wound Dressing: Wound #1 Right,Anterior Lower Leg: Santyl Ointment Secondary Dressing: Wound #1 Right,Anterior Lower Leg: Boardered Foam Dressing Dressing Change Frequency: Wound #1 Right,Anterior Lower Leg: Change dressing every day. Follow-up Appointments: Return Appointment in 1 week. Electronic Signature(s) Signed: 02/24/2017 5:46:58 PM By: Lawanda Cousins Entered By: Lawanda Cousins on 02/24/2017 16:27:45 [place], Neoma Randall ([id number]) -------------------------------------------------------------------------------- ROS/PFSH Details Patient Name: [name] Date of Service: 02/24/2017 2:45 PM Medical Record Number: [id number] Patient Account Number: [id number] Date of Birth/Sex: 10/30/1965 (52 y.o. Female) Treating RN: Roger Shelter Primary Care Provider: Jonna Clark Other Clinician: Referring Provider: Jonna Clark Treating Provider/Extender: Cathie Olden in Treatment: 6 Information Obtained From Patient Wound History Do you currently have one or more open woundso Yes How many open wounds do you currently haveo 1 Approximately how long have you had your woundso one month How have you been treating your wound(s) until nowo 3 weeks Has your wound(s) ever healed and then re-openedo Yes Have you had any lab work done in the past montho Yes Have you tested positive  for an antibiotic resistant organism (MRSA, VRE)o No Have you tested positive for osteomyelitis (bone infection)o No Have you had any tests for circulation on your legso Yes Where was the test doneo [facility] Have you had other problems associated with your woundso Infection Eyes Medical History: Negative for: Cataracts; Glaucoma; Optic Neuritis Ear/Nose/Mouth/Throat Medical History: Negative for: Chronic sinus problems/congestion; Middle ear problems Hematologic/Lymphatic Medical History: Negative for: Anemia; Hemophilia; Human Immunodeficiency Virus; Lymphedema; Sickle Cell Disease Respiratory Medical History: Positive for: Chronic Obstructive Pulmonary Disease (COPD); Sleep Apnea Negative for: Aspiration; Asthma; Pneumothorax; Tuberculosis Cardiovascular Medical History: Positive for: Arrhythmia; Congestive Heart Failure; Deep Vein Thrombosis - in lower legs and history of varicose veins; Hypertension  Negative for: Angina; Coronary Artery Disease; Hypotension; Myocardial Infarction; Peripheral Arterial Disease; Peripheral Venous Disease; Phlebitis; Vasculitis Gastrointestinal Medical History: Negative for: Cirrhosis ; Colitis; Crohnos; Hepatitis A; Hepatitis B; Hepatitis C [place], [place] ([id number]) Endocrine Medical History: Negative for: Type I Diabetes; Type II Diabetes Genitourinary Medical History: Negative for: End Stage Renal Disease Immunological Medical History: Negative for: Lupus Erythematosus; Raynaudos; Scleroderma Integumentary (Skin) Medical History: Negative for: History of Burn; History of pressure wounds Musculoskeletal Medical History: Positive for: Gout; Rheumatoid Arthritis; Osteoarthritis Negative for: Osteomyelitis Neurologic Medical History: Negative for: Dementia; Neuropathy; Quadriplegia; Paraplegia; Seizure Disorder Oncologic Medical History: Negative for: Received Chemotherapy; Received Radiation Psychiatric Medical  History: Positive for: Confinement Anxiety Negative for: Anorexia/bulimia Immunizations Pneumococcal Vaccine: Received Pneumococcal Vaccination: No Implantable Devices Family and Social History Cancer: Yes - Siblings; Diabetes: No; Heart Disease: No; Hypertension: No; Kidney Disease: No; Lung Disease: No; Seizures: No; Stroke: No; Thyroid Problems: No; Tuberculosis: No; Former smoker - stopped 6 years ago; Marital Status - Separated; Alcohol Use: Rarely; Drug Use: No History; Caffeine Use: Moderate; Financial Concerns: No; Food, Clothing or Shelter Needs: No; Support System Lacking: No; Transportation Concerns: No; Advanced Directives: No; Patient does not want information on Advanced Directives; Do not resuscitate: No; Living Will: No; Medical Power of Attorney: No Physician Affirmation I have reviewed and agree with the above information. Electronic Signature(s) [place], [place] ([id number]) Signed: 02/24/2017 4:36:28 PM By: Roger Shelter Signed: 02/24/2017 5:46:58 PM By: Lawanda Cousins Entered By: Lawanda Cousins on 02/24/2017 16:26:27 [name] ([id number]) -------------------------------------------------------------------------------- SuperBill Details Patient Name: [name] Date of Service: 02/24/2017 Medical Record Number: [id number] Patient Account Number: [id number] Date of Birth/Sex: 17-May-1965 (52 y.o. Female) Treating RN: Roger Shelter Primary Care Provider: Jonna Clark Other Clinician: Referring Provider: Jonna Clark Treating Provider/Extender: Cathie Olden in Treatment: 6 Diagnosis Coding ICD-10 Codes Code Description [phone] Non-pressure chronic ulcer of right calf with fat layer exposed I87.331 Chronic venous hypertension (idiopathic) with ulcer and inflammation of right lower extremity L03.115 Cellulitis of right lower limb Facility Procedures CPT4: Description Modifier Quantity Code [id number] - DEB SUBQ TISSUE 20 SQ CM/< 1 ICD-10  Diagnosis Description L97.212 Non-pressure chronic ulcer of right calf with fat layer exposed I87.331 Chronic venous hypertension (idiopathic) with ulcer and  inflammation of right lower extremity Physician Procedures CPT4: Description Modifier Quantity Code [id number] - WC PHYS SUBQ TISS 20 SQ CM 1 ICD-10 Diagnosis Description L97.212 Non-pressure chronic ulcer of right calf with fat layer exposed I87.331 Chronic venous hypertension (idiopathic) with ulcer and  inflammation of right lower extremity Electronic Signature(s) Signed: 02/24/2017 5:46:58 PM By: Lawanda Cousins Entered By: Lawanda Cousins on 02/24/2017 16:27:11

## 2017-02-27 NOTE — Progress Notes (Signed)
TEONIA, YAGER ([id number]) Visit Report for 02/24/2017 Arrival Information Details Patient Name: [name], [name] Date of Service: 02/24/2017 2:45 PM Medical Record Number: [id number] Patient Account Number: [id number] Date of Birth/Sex: April 12, 1965 (52 y.o. Female) Treating RN: Roger Shelter Primary Care Alesia Oshields: Jonna Clark Other Clinician: Referring Erma Raiche: Jonna Clark Treating Jarmaine Ehrler/Extender: Cathie Olden in Treatment: 6 Visit Information History Since Last Visit All ordered tests and consults were completed: No Patient Arrived: Ambulatory Added or deleted any medications: No Arrival Time: 14:59 Any new allergies or adverse reactions: No Accompanied By: daughter Had a fall or experienced change in No Transfer Assistance: None activities of daily living that may affect risk of falls: Signs or symptoms of abuse/neglect since last visito No Hospitalized since last visit: No Pain Present Now: No Electronic Signature(s) Signed: 02/24/2017 4:36:28 PM By: Roger Shelter Entered By: Roger Shelter on 02/24/2017 14:59:58 [name], [name] ([id number]) -------------------------------------------------------------------------------- Encounter Discharge Information Details Patient Name: [name] Date of Service: 02/24/2017 2:45 PM Medical Record Number: [id number] Patient Account Number: [id number] Date of Birth/Sex: 01-05-1966 (52 y.o. Female) Treating RN: Roger Shelter Primary Care Jalasia Eskridge: Jonna Clark Other Clinician: Referring Yosgart Pavey: Jonna Clark Treating Keyoni Lapinski/Extender: Cathie Olden in Treatment: 6 Encounter Discharge Information Items Discharge Pain Level: 0 Discharge Condition: Stable Ambulatory Status: Ambulatory Discharge Destination: Home Transportation: Private Auto Accompanied By: daughter Schedule Follow-up Appointment: Yes Medication Reconciliation completed and No provided to Patient/Care Versie Fleener: Provided on Clinical  Summary of Care: 02/24/2017 Form Type Recipient Paper Patient DW Electronic Signature(s) Signed: 02/25/2017 4:27:15 PM By: Ruthine Dose Entered By: Ruthine Dose on 02/24/2017 15:30:20 [name], [name] ([id number]) -------------------------------------------------------------------------------- Lower Extremity Assessment Details Patient Name: [name] Date of Service: 02/24/2017 2:45 PM Medical Record Number: [id number] Patient Account Number: [id number] Date of Birth/Sex: 28-Aug-1965 (52 y.o. Female) Treating RN: Roger Shelter Primary Care Darion Juhasz: Jonna Clark Other Clinician: Referring Bud Kaeser: Jonna Clark Treating Kyel Purk/Extender: Cathie Olden in Treatment: 6 Edema Assessment Assessed: [Left: No] [Right: No] Edema: [Left: N] [Right: o] Vascular Assessment Claudication: Claudication Assessment [Right:None] Pulses: Dorsalis Pedis Palpable: [Right:Yes] Posterior Tibial Extremity colors, hair growth, and conditions: Extremity Color: [Right:Normal] Hair Growth on Extremity: [Right:Yes] Temperature of Extremity: [Right:Cool] Capillary Refill: [Right:< 3 seconds] Toe Nail Assessment Left: Right: Thick: No Discolored: No Deformed: No Improper Length and Hygiene: No Electronic Signature(s) Signed: 02/24/2017 4:36:28 PM By: Roger Shelter Entered By: Roger Shelter on 02/24/2017 15:05:34 [name], [name] ([id number]) -------------------------------------------------------------------------------- Multi Wound Chart Details Patient Name: [name] Date of Service: 02/24/2017 2:45 PM Medical Record Number: [id number] Patient Account Number: [id number] Date of Birth/Sex: Feb 09, 1965 (52 y.o. Female) Treating RN: Roger Shelter Primary Care Grantham Hippert: Jonna Clark Other Clinician: Referring Harout Scheurich: Jonna Clark Treating Margaret Staggs/Extender: Cathie Olden in Treatment: 6 Vital Signs Height(in): 70 Pulse(bpm): 59 Weight(lbs): 225 Blood  Pressure(mmHg): 168/107 Body Mass Index(BMI): 32 Temperature(F): 98.0 Respiratory Rate 16 (breaths/min): Photos: [1:No Photos] [N/A:N/A] Wound Location: [1:Right Lower Leg - Anterior] [N/A:N/A] Wounding Event: [1:Trauma] [N/A:N/A] Primary Etiology: [1:Trauma, Other] [N/A:N/A] Comorbid History: [1:Chronic Obstructive Pulmonary Disease (COPD), Sleep Apnea, Arrhythmia, Congestive Heart Failure, Deep Vein Thrombosis, Hypertension, Gout, Rheumatoid Arthritis, Osteoarthritis, Confinement Anxiety] [N/A:N/A] Date Acquired: [1:12/09/2016] [N/A:N/A] Weeks of Treatment: [1:6] [N/A:N/A] Wound Status: [1:Open] [N/A:N/A] Measurements L x W x D [1:2x1.7x0.2] [N/A:N/A] (cm) Area (cm) : [1:2.67] [N/A:N/A] Volume (cm) : [1:0.534] [N/A:N/A] % Reduction in Area: [1:-25.00%] [N/A:N/A] % Reduction in Volume: [1:16.70%] [N/A:N/A] Classification: [1:Full Thickness Without Exposed Support Structures] [N/A:N/A] Exudate Amount: [1:Medium] [N/A:N/A] Exudate Type: [1:Serosanguineous] [N/A:N/A] Exudate Color: [1:red, brown] [N/A:N/A] Wound Margin: [1:Flat and Intact] [  N/A:N/A] Granulation Amount: [1:Medium (34-66%)] [N/A:N/A] Granulation Quality: [1:Red] [N/A:N/A] Necrotic Amount: [1:Medium (34-66%)] [N/A:N/A] Necrotic Tissue: [1:Eschar, Adherent Slough] [N/A:N/A] Exposed Structures: [1:Fat Layer (Subcutaneous Tissue) Exposed: Yes Fascia: No Tendon: No Muscle: No] [N/A:N/A] Joint: No Bone: No Epithelialization: None N/A N/A Debridement: Debridement ([id number]) N/A N/A Pre-procedure 03:20 N/A N/A Verification/Time Out Taken: Pain Control: Other N/A N/A Tissue Debrided: Fibrin/Slough, Skin, N/A N/A Subcutaneous Level: Skin/Subcutaneous Tissue N/A N/A Debridement Area (sq cm): 3.4 N/A N/A Instrument: Curette N/A N/A Bleeding: Minimum N/A N/A Hemostasis Achieved: Pressure N/A N/A Procedural Pain: 0 N/A N/A Post Procedural Pain: 0 N/A N/A Debridement Treatment Procedure was tolerated well N/A  N/A Response: Post Debridement 2x1.7x0.2 N/A N/A Measurements L x W x D (cm) Post Debridement Volume: 0.534 N/A N/A (cm) Periwound Skin Texture: Excoriation: Yes N/A N/A Induration: No Callus: No Crepitus: No Rash: No Scarring: No Periwound Skin Moisture: Maceration: Yes N/A N/A Dry/Scaly: No Periwound Skin Color: Hemosiderin Staining: Yes N/A N/A Atrophie Blanche: No Cyanosis: No Ecchymosis: No Erythema: No Mottled: No Pallor: No Rubor: No Temperature: No Abnormality N/A N/A Tenderness on Palpation: Yes N/A N/A Wound Preparation: Ulcer Cleansing: N/A N/A Rinsed/Irrigated with Saline Topical Anesthetic Applied: Other: lidocaine 4% Procedures Performed: Debridement N/A N/A Treatment Notes Wound #1 (Right, Anterior Lower Leg) 1. Cleansed with: Clean wound with Normal Saline 2. Anesthetic Topical Lidocaine 4% cream to wound bed prior to debridement 4. Dressing Applied: Charlott Holler [place], [place] ([id number]) 5. Secondary Dressing Applied Bordered Foam Dressing Electronic Signature(s) Signed: 02/24/2017 5:46:58 PM By: Lawanda Cousins Entered By: Lawanda Cousins on 02/24/2017 16:24:27 [name] ([id number]) -------------------------------------------------------------------------------- Multi-Disciplinary Care Plan Details Patient Name: [name] Date of Service: 02/24/2017 2:45 PM Medical Record Number: [id number] Patient Account Number: [id number] Date of Birth/Sex: [birth year] (52 y.o. Female) Treating RN: Roger Shelter Primary Care Raizy Auzenne: Jonna Clark Other Clinician: Referring Aison Malveaux: Jonna Clark Treating Braylee Lal/Extender: Cathie Olden in Treatment: 6 Active Inactive ` Orientation to the Wound Care Program Nursing Diagnoses: Knowledge deficit related to the wound healing center program Goals: Patient/caregiver will verbalize understanding of the [facility] Program Date Initiated: 01/12/2017 Target Resolution Date:  02/13/2016 Goal Status: Active Interventions: Provide education on orientation to the wound center Notes: ` Wound/Skin Impairment Nursing Diagnoses: Impaired tissue integrity Knowledge deficit related to ulceration/compromised skin integrity Goals: Patient/caregiver will verbalize understanding of skin care regimen Date Initiated: 01/12/2017 Target Resolution Date: 02/12/2017 Goal Status: Active Ulcer/skin breakdown will have a volume reduction of 30% by week 4 Date Initiated: 01/12/2017 Target Resolution Date: 02/12/2017 Goal Status: Active Interventions: Assess patient/caregiver ability to obtain necessary supplies Assess ulceration(s) every visit Provide education on ulcer and skin care Treatment Activities: Skin care regimen initiated : 01/12/2017 Notes: Electronic Signature(s) Signed: 02/24/2017 4:36:28 PM By: Viona Gilmore, [name] ([id number]) Entered By: Roger Shelter on 02/24/2017 15:05:41 [name] ([id number]) -------------------------------------------------------------------------------- Pain Assessment Details Patient Name: [name] Date of Service: 02/24/2017 2:45 PM Medical Record Number: [id number] Patient Account Number: [id number] Date of Birth/Sex: March 01, 1965 (52 y.o. Female) Treating RN: Roger Shelter Primary Care Shaymus Eveleth: Jonna Clark Other Clinician: Referring Musa Rewerts: Jonna Clark Treating Destanie Tibbetts/Extender: Cathie Olden in Treatment: 6 Active Problems Location of Pain Severity and Description of Pain Patient Has Paino No Site Locations Pain Management and Medication Current Pain Management: Electronic Signature(s) Signed: 02/24/2017 4:36:28 PM By: Roger Shelter Entered By: Roger Shelter on 02/24/2017 15:00:10 [name] ([id number]) -------------------------------------------------------------------------------- Patient/Caregiver Education Details Patient Name: [name] Date of Service: 02/24/2017  2:45 PM Medical Record Number: [id number] Patient  Account Number: [id number] Date of Birth/Gender: 19-Jul-1965 (52 y.o. Female) Treating RN: Roger Shelter Primary Care Physician: Jonna Clark Other Clinician: Referring Physician: Jonna Clark Treating Physician/Extender: Cathie Olden in Treatment: 6 Education Assessment Education Provided To: Patient Education Topics Provided Wound Debridement: Handouts: Wound Debridement Methods: Explain/Verbal Responses: State content correctly Wound/Skin Impairment: Handouts: Caring for Your Ulcer Methods: Explain/Verbal Responses: State content correctly Electronic Signature(s) Signed: 02/24/2017 4:36:28 PM By: Roger Shelter Entered By: Roger Shelter on 02/24/2017 15:29:46 [name], [name] ([id number]) -------------------------------------------------------------------------------- Wound Assessment Details Patient Name: [name] Date of Service: 02/24/2017 2:45 PM Medical Record Number: [id number] Patient Account Number: [id number] Date of Birth/Sex: 11-05-1965 (52 y.o. Female) Treating RN: Roger Shelter Primary Care Fotios Amos: Jonna Clark Other Clinician: Referring Wilferd Ritson: Jonna Clark Treating Makenze Ellett/Extender: Cathie Olden in Treatment: 6 Wound Status Wound Number: 1 Primary Trauma, Other Etiology: Wound Location: Right Lower Leg - Anterior Wound Open Wounding Event: Trauma Status: Date Acquired: 12/09/2016 Comorbid Chronic Obstructive Pulmonary Disease (COPD), Weeks Of Treatment: 6 History: Sleep Apnea, Arrhythmia, Congestive Heart Clustered Wound: No Failure, Deep Vein Thrombosis, Hypertension, Gout, Rheumatoid Arthritis, Osteoarthritis, Confinement Anxiety Photos Photo Uploaded By: Roger Shelter on 02/24/2017 16:35:27 Wound Measurements Length: (cm) 2 Width: (cm) 1.7 Depth: (cm) 0.2 Area: (cm) 2.67 Volume: (cm) 0.534 % Reduction in Area: -25% % Reduction in Volume:  16.7% Epithelialization: None Tunneling: No Undermining: No Wound Description Full Thickness Without Exposed Support Classification: Structures Wound Margin: Flat and Intact Exudate Medium Amount: Exudate Type: Serosanguineous Exudate Color: red, brown Foul Odor After Cleansing: No Slough/Fibrino Yes Wound Bed Granulation Amount: Medium (34-66%) Exposed Structure Granulation Quality: Red Fascia Exposed: No Necrotic Amount: Medium (34-66%) Fat Layer (Subcutaneous Tissue) Exposed: Yes Necrotic Quality: Eschar, Adherent Slough Tendon Exposed: No Muscle Exposed: No [name], [name] ([id number]) Joint Exposed: No Bone Exposed: No Periwound Skin Texture Texture Color No Abnormalities Noted: No No Abnormalities Noted: No Callus: No Atrophie Blanche: No Crepitus: No Cyanosis: No Excoriation: Yes Ecchymosis: No Induration: No Erythema: No Rash: No Hemosiderin Staining: Yes Scarring: No Mottled: No Pallor: No Moisture Rubor: No No Abnormalities Noted: No Dry / Scaly: No Temperature / Pain Maceration: Yes Temperature: No Abnormality Tenderness on Palpation: Yes Wound Preparation Ulcer Cleansing: Rinsed/Irrigated with Saline Topical Anesthetic Applied: Other: lidocaine 4%, Treatment Notes Wound #1 (Right, Anterior Lower Leg) 1. Cleansed with: Clean wound with Normal Saline 2. Anesthetic Topical Lidocaine 4% cream to wound bed prior to debridement 4. Dressing Applied: Santyl Ointment 5. Secondary Dressing Applied Bordered Foam Dressing Electronic Signature(s) Signed: 02/24/2017 4:36:28 PM By: Roger Shelter Entered By: Roger Shelter on 02/24/2017 15:04:44 Ceasar, [name] ([id number]) -------------------------------------------------------------------------------- [place] Details Patient Name: [name] Date of Service: 02/24/2017 2:45 PM Medical Record Number: [id number] Patient Account Number: [id number] Date of Birth/Sex: 02-May-1965 (52 y.o.  Female) Treating RN: Roger Shelter Primary Care Davia Smyre: Jonna Clark Other Clinician: Referring Frans Valente: Jonna Clark Treating Harlea Goetzinger/Extender: Cathie Olden in Treatment: 6 Vital Signs Time Taken: 03:00 Temperature (F): 98.0 Height (in): 70 Pulse (bpm): 89 Weight (lbs): 225 Respiratory Rate (breaths/min): 16 Body Mass Index (BMI): 32.3 Blood Pressure (mmHg): 168/107 Reference Range: 80 - 120 mg / dl Electronic Signature(s) Signed: 02/24/2017 4:36:28 PM By: Roger Shelter Entered By: Roger Shelter on 02/24/2017 15:00:59

## 2017-03-03 ENCOUNTER — Ambulatory Visit: Payer: Medicaid Other | Admitting: Nurse Practitioner

## 2017-03-10 ENCOUNTER — Ambulatory Visit: Payer: Self-pay | Admitting: Family Medicine

## 2017-03-14 ENCOUNTER — Ambulatory Visit: Payer: Medicaid Other | Admitting: Physician Assistant

## 2017-03-21 ENCOUNTER — Encounter: Payer: Medicaid Other | Attending: Physician Assistant | Admitting: Physician Assistant

## 2017-03-21 DIAGNOSIS — M069 Rheumatoid arthritis, unspecified: Secondary | ICD-10-CM | POA: Diagnosis not present

## 2017-03-21 DIAGNOSIS — L97212 Non-pressure chronic ulcer of right calf with fat layer exposed: Secondary | ICD-10-CM | POA: Diagnosis present

## 2017-03-21 DIAGNOSIS — F419 Anxiety disorder, unspecified: Secondary | ICD-10-CM | POA: Insufficient documentation

## 2017-03-21 DIAGNOSIS — I509 Heart failure, unspecified: Secondary | ICD-10-CM | POA: Insufficient documentation

## 2017-03-21 DIAGNOSIS — J449 Chronic obstructive pulmonary disease, unspecified: Secondary | ICD-10-CM | POA: Insufficient documentation

## 2017-03-21 DIAGNOSIS — M109 Gout, unspecified: Secondary | ICD-10-CM | POA: Insufficient documentation

## 2017-03-21 DIAGNOSIS — L03115 Cellulitis of right lower limb: Secondary | ICD-10-CM | POA: Diagnosis not present

## 2017-03-21 DIAGNOSIS — I87331 Chronic venous hypertension (idiopathic) with ulcer and inflammation of right lower extremity: Secondary | ICD-10-CM | POA: Diagnosis not present

## 2017-03-21 DIAGNOSIS — I110 Hypertensive heart disease with heart failure: Secondary | ICD-10-CM | POA: Insufficient documentation

## 2017-03-21 DIAGNOSIS — I4891 Unspecified atrial fibrillation: Secondary | ICD-10-CM | POA: Diagnosis not present

## 2017-03-21 DIAGNOSIS — Z87891 Personal history of nicotine dependence: Secondary | ICD-10-CM | POA: Insufficient documentation

## 2017-03-22 NOTE — Progress Notes (Signed)
HPI   [name] is a 52 y/o female with a history of pulmonary embolus, obstructive sleep apnea, atrial fibrillation, COPD, gout, HTN, CVA, remote tobacco use and chronic heart failure.   Echo report from 06/17/16 reviewed and showed an EF of 55-65%. Had a pharmacologic stress trest done 09/12/12 and had an estimated EF of 49%.   Was in the ED 02/19/17 due to gout where she was treated and released. Was in the ED 12/30/16 due to cellulitis of lower leg. Was treated with antibiotics and released.   She presents today for a follow-up visit with a chief complaint of mild swelling around her ankles. She says this has been present for the last couple of weeks as she's been out of all her medications for the last 2 weeks. She said she was feeling good so didn't think about calling until the other day. She doesn't have any associated symptoms. She denies any fatigue, cough, shortness of breath, chest pain, palpitations, abdominal distention, dizziness, headaches or difficulty sleeping.   Past Medical History:  Diagnosis Date  . A-fib ([place]) 2010  . CHF (congestive heart failure) ([place])   . COPD (chronic obstructive pulmonary disease) ([place])   . Gout   . Hypertension   . Pulmonary embolus ([place])   . Stroke [facility])    Past Surgical History:  Procedure Laterality Date  . bilateral wrist fractures  2010  . lt ankle fracture  2002   Family History  Problem Relation Age of Onset  . Prostate cancer Father   . Rectal cancer Sister   . Breast cancer Sister    Social History   Tobacco Use  . Smoking status: Former Smoker    Types: Cigarettes    Last attempt to quit: 01/26/2016    Years since quitting: 1.1  . Smokeless tobacco: Never Used  Substance Use Topics  . Alcohol use: Yes    Comment: occ   Allergies  Allergen Reactions  . Morphine And Related Hives  . Penicillins Other (See Comments)    Painful urination Has patient had a PCN reaction causing immediate rash, facial/tongue/throat swelling,  SOB or lightheadedness with hypotension: yes Has patient had a PCN reaction causing severe rash involving mucus membranes or skin necrosis: no Has patient had a PCN reaction that required hospitalization no Has patient had a PCN reaction occurring within the last 10 years: no If all of the above answers are "NO", then may proceed with Cephalosporin use.    Prior to Admission medications   Medication Sig Start Date End Date Taking? Authorizing Provider  aspirin EC 81 MG tablet Take 81 mg by mouth daily.   Yes [provider]  amLODipine (NORVASC) 10 MG tablet Take 10 mg by mouth daily.    [provider]  atorvastatin (LIPITOR) 20 MG tablet Take 20 mg by mouth daily.    [provider]  cloNIDine (CATAPRES) 0.1 MG tablet Take 0.1 mg by mouth 2 (two) times daily.    [provider]  diphenhydrAMINE (BENADRYL) 25 MG tablet Take 25 mg by mouth every 6 (six) hours as needed.    [provider]  furosemide (LASIX) 40 MG tablet Take 1 tablet (40 mg total) by mouth daily. 03/24/17 06/22/17  Alisa Graff, FNP  hydrALAZINE (APRESOLINE) 50 MG tablet Take 50 mg by mouth 3 (three) times daily.    [provider]  lisinopril (PRINIVIL,ZESTRIL) 20 MG tablet Take 1 tablet (20 mg total) by mouth daily. 03/24/17  Darylene Price A, FNP  metoCLOPramide (REGLAN) 10 MG tablet Take 1 tablet (10 mg total) by mouth every 8 (eight) hours as needed for nausea. Patient not taking: Reported on 03/24/2017 04/01/15 06/14/16  Loney Hering, MD  metoprolol succinate (TOPROL-XL) 100 MG 24 hr tablet Take 100 mg by mouth daily. Take with or immediately following a meal.    [provider]  naproxen (NAPROSYN) 500 MG tablet Take 1 tablet (500 mg total) by mouth 2 (two) times daily with a meal. Patient not taking: Reported on 03/24/2017 02/19/17 02/19/18  Nena Polio, MD  ondansetron (ZOFRAN) 4 MG tablet Take 1 tablet (4 mg total) by mouth daily as needed for nausea  or vomiting. Patient not taking: Reported on 03/24/2017 07/22/15   Lavonia Drafts, MD  potassium chloride SA (KLOR-CON M20) 20 MEQ tablet Take 1 tablet (20 mEq total) by mouth daily. 03/24/17   Alisa Graff, FNP  sertraline (ZOLOFT) 100 MG tablet Take 100 mg by mouth daily.    [provider]  silver sulfADIAZINE (SILVADENE) 1 % cream Apply 1 application topically daily. Patient not taking: Reported on 03/24/2017 12/31/16   Stegmayer, Janalyn Harder, PA-C  spironolactone (ALDACTONE) 25 MG tablet Take 50 mg by mouth daily.     [provider]    Review of Systems  Constitutional: Negative for appetite change and fatigue.  HENT: Negative for congestion, postnasal drip and sore throat.   Eyes: Negative.   Respiratory: Negative for cough, chest tightness and shortness of breath.   Cardiovascular: Positive for leg swelling (around ankles). Negative for chest pain and palpitations.  Gastrointestinal: Negative for abdominal distention, abdominal pain and nausea.  Endocrine: Negative.   Genitourinary: Negative.   Musculoskeletal: Negative for back pain and neck pain.  Skin: Negative.   Allergic/Immunologic: Negative.   Neurological: Negative for dizziness, light-headedness and headaches.  Hematological: Negative for adenopathy. Does not bruise/bleed easily.  Psychiatric/Behavioral: Negative for dysphoric mood and sleep disturbance (wearing CPAP nightly; sleeping on 2 pillows). The patient is not nervous/anxious.    Vitals:   03/24/17 0849  BP: (!) 166/148  Pulse: 79  Resp: 18  SpO2: 99%  Weight: 232 lb (105.2 kg)  Height: 5\' 10"  (1.778 m)   Wt Readings from Last 3 Encounters:  03/24/17 232 lb (105.2 kg)  02/19/17 225 lb (102.1 kg)  02/02/17 224 lb (101.6 kg)   Lab Results  Component Value Date   CREATININE 0.73 02/19/2017   CREATININE 1.02 (H) 12/30/2016   CREATININE 0.70 06/14/2016    Physical Exam  Constitutional: She is oriented to person, place, and time. She  appears well-developed and well-nourished.  HENT:  Head: Normocephalic and atraumatic.  Neck: Normal range of motion. Neck supple. No JVD present.  Cardiovascular: Normal rate and regular rhythm.  Pulmonary/Chest: Effort normal. She has no wheezes. She has no rales.  Abdominal: Soft. She exhibits no distension. There is no tenderness.  Musculoskeletal: She exhibits edema (trace edema around ankles). She exhibits no tenderness.  Neurological: She is alert and oriented to person, place, and time.  Skin: Skin is warm and dry.  Psychiatric: She has a normal mood and affect. Her behavior is normal. Thought content normal.  Nursing note and vitals reviewed.   Assessment & Plan:  1: Chronic heart failure with preserved ejection fraction- - NYHA class I - euvolemic today -  weighing daily. She is to call for an overnight weight gain of >2 pounds or a weekly weight gain of >5  pounds.  - not adding salt to her food. Reminded to closely follow a 2000mg  sodium diet  - will resume her furosemide 40mg  daily along with potassium 53meq daily - saw cardiology Margarito Courser) 06/18/16 - does not get the flu vaccine; encouraged good handwashing  2: HTN- - BP elevated although she's been out of all her medications for the last 2 weeks - resume lisinopril at 20mg  daily - sees new PCP Orene Desanctis) 04/07/17 - reviewed BMP done 02/19/17 and it showed sodium 143, potassium 3.3 and GFR >60 - will get a BMP at her next visit  Return in 1 week or sooner for any questions/problems before then.   Discussed the importance of not running out of her medications.

## 2017-03-22 NOTE — Progress Notes (Signed)
HELAINA, Randall ([id number]) Visit Report for 03/21/2017 Chief Complaint Document Details Patient Name: [name], [name] Date of Service: 03/21/2017 9:15 AM Medical Record Number: [id number] Patient Account Number: [id number] Date of Birth/Sex: 22-Jun-1965 (52 y.o. Female) Treating RN: Roger Shelter Primary Care Provider: Jonna Clark Other Clinician: Referring Provider: Jonna Clark Treating Provider/Extender: Melburn Hake, Jaquell Seddon Weeks in Treatment: 9 Information Obtained from: Patient Chief Complaint Right LE Ulcer Electronic Signature(s) Signed: 03/21/2017 4:51:58 PM By: Worthy Keeler PA-C Entered By: Worthy Keeler on 03/21/2017 09:31:56 [name], [name] ([id number]) -------------------------------------------------------------------------------- Debridement Details Patient Name: [name] Date of Service: 03/21/2017 9:15 AM Medical Record Number: [id number] Patient Account Number: [id number] Date of Birth/Sex: 02-27-1965 (52 y.o. Female) Treating RN: Roger Shelter Primary Care Provider: Jonna Clark Other Clinician: Referring Provider: Jonna Clark Treating Provider/Extender: Melburn Hake, Lynde Ludwig Weeks in Treatment: 9 Debridement Performed for Wound #1 Right,Anterior Lower Leg Assessment: Performed By: Physician STONE III, Zaidan Keeble E., PA-C Debridement: Debridement Pre-procedure Verification/Time Yes - 09:38 Out Taken: Start Time: 09:38 Pain Control: Other : lidocaine 4% Level: Skin/Subcutaneous Tissue Total Area Debrided (L x W): 2.2 (cm) x 2 (cm) = 4.4 (cm) Tissue and other material Viable, Non-Viable, Fibrin/Slough, Skin, Subcutaneous debrided: Instrument: Curette Bleeding: Moderate Hemostasis Achieved: Pressure End Time: 09:38 Procedural Pain: 0 Post Procedural Pain: 0 Response to Treatment: Procedure was tolerated well Post Debridement Measurements of Total Wound Length: (cm) 2.2 Width: (cm) 2 Depth: (cm) 0.3 Volume: (cm) 1.037 Character of Wound/Ulcer  Post Debridement: Stable Post Procedure Diagnosis Same as Pre-procedure Electronic Signature(s) Signed: 03/21/2017 4:14:37 PM By: Roger Shelter Signed: 03/21/2017 4:51:58 PM By: Worthy Keeler PA-C Entered By: Roger Shelter on 03/21/2017 09:39:19 [name], [name] ([id number]) -------------------------------------------------------------------------------- HPI Details Patient Name: [name] Date of Service: 03/21/2017 9:15 AM Medical Record Number: [id number] Patient Account Number: [id number] Date of Birth/Sex: 1965-09-18 (52 y.o. Female) Treating RN: Roger Shelter Primary Care Provider: Jonna Clark Other Clinician: Referring Provider: Jonna Clark Treating Provider/Extender: Melburn Hake, Bambi Fehnel Weeks in Treatment: 9 History of Present Illness HPI Description: 01/12/17; this is a 52 year old nondiabetic remote ex-smoker. She tells me that sometime in early November she developed a sudden painful area on her right medial lower leg while she was in bed at night she reached down and scratch the area and woke up in the morning with a scab small wound. This gradually got worse over time. She was seen in the ER on 12/18/16. At that point the possibility of an insect bite/brown recluse spider bite was brought up. At that point it was 1 cm eschar covered wound. She was treated with Bactrim DS. She was back in the ER on 01/09/17 with increasing size pain of the wound. At this point the patient states it was covered with a black eschar at surface. An x-ray showed punctate radio density material within the wound but no osteomyelitis. 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 She was put on clindamycin. The patient was seen on 12/7 by vascular surgery. At that point noting a 3 x 3 cm necrotic wound. ABIs were ordered. She was treated with Silvadene cream. I don't see the results of the ABIs at this point. She is using Silvadene cream. A comprehensive metabolic panel CBC and  differential PT and PTT were done which were also normal. 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 COPD/asthma/depression and a history of atrial fib. She tells me she had a history of vein stripping many years ago ABIs in our clinic were noncompressible  on the right at 1.59 01/19/17; right medial lower leg wound. Small wound with some depth. Still requiring debridement we are using Santyl. She has vascular studies on January 9 but at this point I can't tell whether these are arterial venous or both. They are at [facility] 01/27/16; right medial lower leg wound. She has arterial and venous studies later this month. The wound looks somewhat better after debridement. We've been using Santyl. 02/08/17 on evaluation today patient has had for arterial studies performed. This showed noncompressible measurements in regard to the ABI although she did have tried phasic flow and it stated in the report that she did have good TBI's. With that being said I see no documentation of TBI's and I cannot find the final report in epic only see what the patient brought in to show me today. We obviously are going to need to contact the office to see about getting the final report. We have been using Santyl and this does seem to be helping the wound to a degree. Patient is having no significant pain. 03/21/17 on evaluation today patient appears to be a little bit larger in regard to her ulcer. It has actually been almost a month since we have last seen her. She states that she has continued to use the [place] during that time. With that being said she is noting that she does have "pus" noted on the surface of the wound. Upon further inspection it actually appears that this is more slough and not pass noted at this point. There is no erythema surrounding the wound and nothing that would suggest that she is having an infection at this point. Her blood pressure is significantly  high at this time. She states that she is just about out of blood pressure medication obviously we did tell her we cannot prescribed this needs to see her primary care provider as soon as possible. Electronic Signature(s) Signed: 03/21/2017 4:51:58 PM By: Worthy Keeler PA-C Entered By: Worthy Keeler on 03/21/2017 10:19:11 [name], [name] ([id number]) -------------------------------------------------------------------------------- Physical Exam Details Patient Name: [name] Date of Service: 03/21/2017 9:15 AM Medical Record Number: [id number] Patient Account Number: [id number] Date of Birth/Sex: Apr 03, 1965 (52 y.o. Female) Treating RN: Roger Shelter Primary Care Provider: Jonna Clark Other Clinician: Referring Provider: Jonna Clark Treating Provider/Extender: STONE III, Chrstopher Malenfant Weeks in Treatment: 9 Constitutional Chronically ill appearing but in no apparent acute distress. Respiratory normal breathing without difficulty. Psychiatric this patient is able to make decisions and demonstrates good insight into disease process. Alert and Oriented x 3. pleasant and cooperative. Notes Currently in regard to patients wound she does seem to have slough covering which did require sharp debridement today. She tolerated this without complication. Fortunately she does not seem to have significant lower extremity edema at this time. Post debridement the wound appeared to be doing better although there was still slough that needs to come off fortunately she did not have significant pain. Electronic Signature(s) Signed: 03/21/2017 4:51:58 PM By: Worthy Keeler PA-C Entered By: Worthy Keeler on 03/21/2017 10:19:58 [name], [name] ([id number]) -------------------------------------------------------------------------------- Physician Orders Details Patient Name: [name] Date of Service: 03/21/2017 9:15 AM Medical Record Number: [id number] Patient Account Number: [id number] Date of  Birth/Sex: [birth year] (52 y.o. Female) Treating RN: Roger Shelter Primary Care Provider: Jonna Clark Other Clinician: Referring Provider: Jonna Clark Treating Provider/Extender: Melburn Hake, Teddi Badalamenti Weeks in Treatment: 9 Verbal / Phone Orders: No Diagnosis Coding ICD-10 Coding Code Description L97.212 Non-pressure chronic ulcer of right  calf with fat layer exposed I87.331 Chronic venous hypertension (idiopathic) with ulcer and inflammation of right lower extremity L03.115 Cellulitis of right lower limb Wound Cleansing Wound #1 Right,Anterior Lower Leg o Clean wound with Normal Saline. Anesthetic (add to Medication List) Wound #1 Right,Anterior Lower Leg o Topical Lidocaine 4% cream applied to wound bed prior to debridement (In Clinic Only). Primary Wound Dressing Wound #1 Right,Anterior Lower Leg o Iodoflex Secondary Dressing Wound #1 Right,Anterior Lower Leg o Boardered Foam Dressing Dressing Change Frequency Wound #1 Right,Anterior Lower Leg o Change dressing every other day. Follow-up Appointments o Return Appointment in 1 week. Additional Orders / Instructions o Vitamin A; Vitamin C, Zinc o Increase protein intake. Electronic Signature(s) Signed: 03/21/2017 4:51:58 PM By: Worthy Keeler PA-C Entered By: Worthy Keeler on 03/21/2017 10:21:05 [place], [name] ([id number]) -------------------------------------------------------------------------------- Problem List Details Patient Name: [name] Date of Service: 03/21/2017 9:15 AM Medical Record Number: [id number] Patient Account Number: [id number] Date of Birth/Sex: 1966/01/01 (52 y.o. Female) Treating RN: Roger Shelter Primary Care Provider: Jonna Clark Other Clinician: Referring Provider: Jonna Clark Treating Provider/Extender: Melburn Hake, Shonika Kolasinski Weeks in Treatment: 9 Active Problems ICD-10 Encounter Code Description Active Date Diagnosis L97.212 Non-pressure chronic ulcer of right calf  with fat layer exposed 01/12/2017 Yes I87.331 Chronic venous hypertension (idiopathic) with ulcer and 01/12/2017 Yes inflammation of right lower extremity L03.115 Cellulitis of right lower limb 01/12/2017 Yes Inactive Problems Resolved Problems Electronic Signature(s) Signed: 03/21/2017 4:51:58 PM By: Worthy Keeler PA-C Entered By: Worthy Keeler on 03/21/2017 09:31:47 [name], [name] ([id number]) -------------------------------------------------------------------------------- Progress Note Details Patient Name: [name] Date of Service: 03/21/2017 9:15 AM Medical Record Number: [id number] Patient Account Number: [id number] Date of Birth/Sex: 04/14/1965 (52 y.o. Female) Treating RN: Roger Shelter Primary Care Provider: Jonna Clark Other Clinician: Referring Provider: Jonna Clark Treating Provider/Extender: Melburn Hake, Jayleen Afonso Weeks in Treatment: 9 Subjective Chief Complaint Information obtained from Patient Right LE Ulcer History of Present Illness (HPI) 01/12/17; this is a 52 year old nondiabetic remote ex-smoker. She tells me that sometime in early November she developed a sudden painful area on her right medial lower leg while she was in bed at night she reached down and scratch the area and woke up in the morning with a scab small wound. This gradually got worse over time. She was seen in the ER on 12/18/16. At that point the possibility of an insect bite/brown recluse spider bite was brought up. At that point it was 1 cm eschar covered wound. She was treated with Bactrim DS. She was back in the ER on 01/09/17 with increasing size pain of the wound. At this point the patient states it was covered with a black eschar at surface. An x-ray showed punctate radio density material within the wound but no osteomyelitis. 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 She was put on clindamycin. The patient was seen on 12/7 by vascular surgery. At that point noting a 3 x 3 cm  necrotic wound. ABIs were ordered. She was treated with Silvadene cream. I don't see the results of the ABIs at this point. She is using Silvadene cream. A comprehensive metabolic panel CBC and differential PT and PTT were done which were also normal. 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 COPD/asthma/depression and a history of atrial fib. She tells me she had a history of vein stripping many years ago ABIs in our clinic were noncompressible on the right at 1.59 01/19/17; right medial lower leg wound. Small  wound with some depth. Still requiring debridement we are using Santyl. She has vascular studies on January 9 but at this point I can't tell whether these are arterial venous or both. They are at [facility] 01/27/16; right medial lower leg wound. She has arterial and venous studies later this month. The wound looks somewhat better after debridement. We've been using Santyl. 02/08/17 on evaluation today patient has had for arterial studies performed. This showed noncompressible measurements in regard to the ABI although she did have tried phasic flow and it stated in the report that she did have good TBI's. With that being said I see no documentation of TBI's and I cannot find the final report in epic only see what the patient brought in to show me today. We obviously are going to need to contact the office to see about getting the final report. We have been using Santyl and this does seem to be helping the wound to a degree. Patient is having no significant pain. 03/21/17 on evaluation today patient appears to be a little bit larger in regard to her ulcer. It has actually been almost a month since we have last seen her. She states that she has continued to use the [place] during that time. With that being said she is noting that she does have "pus" noted on the surface of the wound. Upon further inspection it actually appears that this is more  slough and not pass noted at this point. There is no erythema surrounding the wound and nothing that would suggest that she is having an infection at this point. Her blood pressure is significantly high at this time. She states that she is just about out of blood pressure medication obviously we did tell her we cannot prescribed this needs to see her primary care provider as soon as possible. Patient History Information obtained from Patient. DOMONIC, HISCOX ([id number]) Family History Cancer - Siblings, No family history of Diabetes, Heart Disease, Hypertension, Kidney Disease, Lung Disease, Seizures, Stroke, Thyroid Problems, Tuberculosis. Social History Former smoker - stopped 6 years ago, Marital Status - Separated, Alcohol Use - Rarely, Drug Use - No History, Caffeine Use - Moderate. Review of Systems (ROS) Constitutional Symptoms (General Health) Denies complaints or symptoms of Fever, Chills. Respiratory The patient has no complaints or symptoms. Cardiovascular The patient has no complaints or symptoms. Psychiatric The patient has no complaints or symptoms. Objective Constitutional Chronically ill appearing but in no apparent acute distress. Vitals Time Taken: 9:21 AM, Height: 70 in, Weight: 225 lbs, BMI: 32.3, Temperature: 97.7 F, Pulse: 67 bpm, Respiratory Rate: 18 breaths/min, Blood Pressure: 191/116 mmHg. General Notes: Pt states that this BP is normal for her and she is out of BP medication. I told her to call her PCP and I made Margarita Grizzle, PA-C aware. Respiratory normal breathing without difficulty. Psychiatric this patient is able to make decisions and demonstrates good insight into disease process. Alert and Oriented x 3. pleasant and cooperative. General Notes: Currently in regard to patients wound she does seem to have slough covering which did require sharp debridement today. She tolerated this without complication. Fortunately she does not seem to have significant  lower extremity edema at this time. Post debridement the wound appeared to be doing better although there was still slough that needs to come off fortunately she did not have significant pain. Integumentary (Hair, Skin) Wound #1 status is Open. Original cause of wound was Trauma. The wound is located on the Right,Anterior Lower Leg.  The wound measures 2.2cm length x 2cm width x 0.2cm depth; 3.456cm^2 area and 0.691cm^3 volume. There is Fat Layer (Subcutaneous Tissue) Exposed exposed. There is no tunneling or undermining noted. There is a large amount of serous drainage noted. The wound margin is flat and intact. There is no granulation within the wound bed. There is a large (67-100%) amount of necrotic tissue within the wound bed including Adherent Slough. The periwound skin appearance exhibited: Maceration, Hemosiderin Staining. The periwound skin appearance did not exhibit: Callus, Crepitus, Excoriation, Induration, Rash, Scarring, Dry/Scaly, Atrophie Blanche, Cyanosis, Ecchymosis, Mottled, Pallor, Rubor, Erythema. Periwound Carver, Tykerria ([id number]) temperature was noted as No Abnormality. The periwound has tenderness on palpation. Assessment Active Problems ICD-10 L97.212 - Non-pressure chronic ulcer of right calf with fat layer exposed I87.331 - Chronic venous hypertension (idiopathic) with ulcer and inflammation of right lower extremity L03.115 - Cellulitis of right lower limb Procedures Wound #1 Pre-procedure diagnosis of Wound #1 is a Trauma, Other located on the Right,Anterior Lower Leg . There was a Skin/Subcutaneous Tissue Debridement ([id number]) debridement with total area of 4.4 sq cm performed by STONE III, Leeman Johnsey E., PA-C. with the following instrument(s): Curette to remove Viable and Non-Viable tissue/material including Fibrin/Slough, Skin, and Subcutaneous after achieving pain control using Other (lidocaine 4%). A time out was conducted at 09:38, prior to the start of the  procedure. A Moderate amount of bleeding was controlled with Pressure. The procedure was tolerated well with a pain level of 0 throughout and a pain level of 0 following the procedure. Post Debridement Measurements: 2.2cm length x 2cm width x 0.3cm depth; 1.037cm^3 volume. Character of Wound/Ulcer Post Debridement is stable. Post procedure Diagnosis Wound #1: Same as Pre-Procedure Plan Wound Cleansing: Wound #1 Right,Anterior Lower Leg: Clean wound with Normal Saline. Anesthetic (add to Medication List): Wound #1 Right,Anterior Lower Leg: Topical Lidocaine 4% cream applied to wound bed prior to debridement (In Clinic Only). Primary Wound Dressing: Wound #1 Right,Anterior Lower Leg: Iodoflex Secondary Dressing: Wound #1 Right,Anterior Lower Leg: Boardered Foam Dressing Dressing Change Frequency: Wound #1 Right,Anterior Lower Leg: Change dressing every other day. Follow-up Appointments: Return Appointment in 1 week. Additional Orders / Instructions: Vitamin A; Vitamin C, Zinc Stiger, Laquasia ([id number]) Increase protein intake. I am going to suggest that we actually switch to Iodoflex at this point. Subsequently I'm also gonna suggest that the patient follow-up as soon as possible with her primary care provider due to her significantly elevated blood pressure which she also states she is out of her medication as of today regarding. I think this is extremely important especially considering her blood pressure was high today. We will see her for reevaluation in one weeks time to see were things stand. I did discuss with her that regular follow-up visits weekly for debridement will definitely help this wound to improve honestly since we last seen her this actually appears to be larger than it was previous. I think this is directly attributed to the fact that she really is not getting the best wound care due to noncompliance and not following up. Please see above for specific wound care  orders. We will see patient for re-evaluation in 1 week(s) here in the clinic. If anything worsens or changes patient will contact our office for additional recommendations. Electronic Signature(s) Signed: 03/21/2017 4:51:58 PM By: Worthy Keeler PA-C Entered By: Worthy Keeler on 03/21/2017 10:21:54 [name], [name] ([id number]) -------------------------------------------------------------------------------- ROS/PFSH Details Patient Name: [name] Date of Service: 03/21/2017 9:15 AM Medical Record Number: [id number]  Patient Account Number: [id number] Date of Birth/Sex: Jul 26, 1965 (52 y.o. Female) Treating RN: Roger Shelter Primary Care Provider: Jonna Clark Other Clinician: Referring Provider: Jonna Clark Treating Provider/Extender: Melburn Hake, Taydem Cavagnaro Weeks in Treatment: 9 Information Obtained From Patient Wound History Do you currently have one or more open woundso Yes How many open wounds do you currently haveo 1 Approximately how Betty have you had your woundso one month How have you been treating your wound(s) until nowo 3 weeks Has your wound(s) ever healed and then re-openedo Yes Have you had any lab work done in the past montho Yes Have you tested positive for an antibiotic resistant organism (MRSA, VRE)o No Have you tested positive for osteomyelitis (bone infection)o No Have you had any tests for circulation on your legso Yes Where was the test doneo [facility] Have you had other problems associated with your woundso Infection Constitutional Symptoms (General Health) Complaints and Symptoms: Negative for: Fever; Chills Eyes Medical History: Negative for: Cataracts; Glaucoma; Optic Neuritis Ear/Nose/Mouth/Throat Medical History: Negative for: Chronic sinus problems/congestion; Middle ear problems Hematologic/Lymphatic Medical History: Negative for: Anemia; Hemophilia; Human Immunodeficiency Virus; Lymphedema; Sickle Cell Disease Respiratory Complaints and  Symptoms: No Complaints or Symptoms Medical History: Positive for: Chronic Obstructive Pulmonary Disease (COPD); Sleep Apnea Negative for: Aspiration; Asthma; Pneumothorax; Tuberculosis Cardiovascular Complaints and Symptoms: No Complaints or Symptoms [name], [name] ([id number]) Medical History: Positive for: Arrhythmia; Congestive Heart Failure; Deep Vein Thrombosis - in lower legs and history of varicose veins; Hypertension Negative for: Angina; Coronary Artery Disease; Hypotension; Myocardial Infarction; Peripheral Arterial Disease; Peripheral Venous Disease; Phlebitis; Vasculitis Gastrointestinal Medical History: Negative for: Cirrhosis ; Colitis; Crohnos; Hepatitis A; Hepatitis B; Hepatitis C Endocrine Medical History: Negative for: Type I Diabetes; Type II Diabetes Genitourinary Medical History: Negative for: End Stage Renal Disease Immunological Medical History: Negative for: Lupus Erythematosus; Raynaudos; Scleroderma Integumentary (Skin) Medical History: Negative for: History of Burn; History of pressure wounds Musculoskeletal Medical History: Positive for: Gout; Rheumatoid Arthritis; Osteoarthritis Negative for: Osteomyelitis Neurologic Medical History: Negative for: Dementia; Neuropathy; Quadriplegia; Paraplegia; Seizure Disorder Oncologic Medical History: Negative for: Received Chemotherapy; Received Radiation Psychiatric Complaints and Symptoms: No Complaints or Symptoms Medical History: Positive for: Confinement Anxiety Negative for: Anorexia/bulimia Immunizations Pneumococcal Vaccine: Received Pneumococcal Vaccination: No [name], [name] ([id number]) Implantable Devices Family and Social History Cancer: Yes - Siblings; Diabetes: No; Heart Disease: No; Hypertension: No; Kidney Disease: No; Lung Disease: No; Seizures: No; Stroke: No; Thyroid Problems: No; Tuberculosis: No; Former smoker - stopped 6 years ago; Marital Status - Separated; Alcohol Use:  Rarely; Drug Use: No History; Caffeine Use: Moderate; Financial Concerns: No; Food, Clothing or Shelter Needs: No; Support System Lacking: No; Transportation Concerns: No; Advanced Directives: No; Patient does not want information on Advanced Directives; Do not resuscitate: No; Living Will: No; Medical Power of Attorney: No Physician Affirmation I have reviewed and agree with the above information. Electronic Signature(s) Signed: 03/21/2017 4:14:37 PM By: Roger Shelter Signed: 03/21/2017 4:51:58 PM By: Worthy Keeler PA-C Entered By: Worthy Keeler on 03/21/2017 10:19:33 [name], [name] ([id number]) -------------------------------------------------------------------------------- SuperBill Details Patient Name: [name] Date of Service: 03/21/2017 Medical Record Number: [id number] Patient Account Number: [id number] Date of Birth/Sex: 10-06-1965 (52 y.o. Female) Treating RN: Roger Shelter Primary Care Provider: Jonna Clark Other Clinician: Referring Provider: Jonna Clark Treating Provider/Extender: Melburn Hake, Tyeson Tanimoto Weeks in Treatment: 9 Diagnosis Coding ICD-10 Codes Code Description [phone] Non-pressure chronic ulcer of right calf with fat layer exposed I87.331 Chronic venous hypertension (idiopathic) with ulcer and inflammation of right lower extremity  L03.115 Cellulitis of right lower limb Facility Procedures CPT4 Code: [id number] Description: 15615 - DEB SUBQ TISSUE 20 SQ CM/< ICD-10 Diagnosis Description L97.212 Non-pressure chronic ulcer of right calf with fat layer exp Modifier: osed Quantity: 1 Physician Procedures CPT4 Code: [id number] Description: 61470 - WC PHYS SUBQ TISS 20 SQ CM ICD-10 Diagnosis Description L29.574 Non-pressure chronic ulcer of right calf with fat layer exp Modifier: osed Quantity: 1 Electronic Signature(s) Signed: 03/21/2017 4:51:58 PM By: Worthy Keeler PA-C Entered By: Worthy Keeler on 03/21/2017 10:22:05

## 2017-03-24 ENCOUNTER — Encounter: Payer: Self-pay | Admitting: Family

## 2017-03-24 ENCOUNTER — Ambulatory Visit: Payer: Medicaid Other | Attending: Family | Admitting: Family

## 2017-03-24 VITALS — BP 166/148 | HR 79 | Resp 18 | Ht 70.0 in | Wt 232.0 lb

## 2017-03-24 DIAGNOSIS — I110 Hypertensive heart disease with heart failure: Secondary | ICD-10-CM | POA: Diagnosis not present

## 2017-03-24 DIAGNOSIS — Z87891 Personal history of nicotine dependence: Secondary | ICD-10-CM | POA: Insufficient documentation

## 2017-03-24 DIAGNOSIS — Z880 Allergy status to penicillin: Secondary | ICD-10-CM | POA: Diagnosis not present

## 2017-03-24 DIAGNOSIS — Z8673 Personal history of transient ischemic attack (TIA), and cerebral infarction without residual deficits: Secondary | ICD-10-CM | POA: Diagnosis not present

## 2017-03-24 DIAGNOSIS — I5032 Chronic diastolic (congestive) heart failure: Secondary | ICD-10-CM

## 2017-03-24 DIAGNOSIS — J449 Chronic obstructive pulmonary disease, unspecified: Secondary | ICD-10-CM | POA: Insufficient documentation

## 2017-03-24 DIAGNOSIS — G4733 Obstructive sleep apnea (adult) (pediatric): Secondary | ICD-10-CM | POA: Insufficient documentation

## 2017-03-24 DIAGNOSIS — I10 Essential (primary) hypertension: Secondary | ICD-10-CM

## 2017-03-24 DIAGNOSIS — R2243 Localized swelling, mass and lump, lower limb, bilateral: Secondary | ICD-10-CM | POA: Insufficient documentation

## 2017-03-24 DIAGNOSIS — M109 Gout, unspecified: Secondary | ICD-10-CM | POA: Insufficient documentation

## 2017-03-24 DIAGNOSIS — I4891 Unspecified atrial fibrillation: Secondary | ICD-10-CM | POA: Diagnosis not present

## 2017-03-24 DIAGNOSIS — Z79899 Other long term (current) drug therapy: Secondary | ICD-10-CM | POA: Diagnosis not present

## 2017-03-24 DIAGNOSIS — Z7982 Long term (current) use of aspirin: Secondary | ICD-10-CM | POA: Insufficient documentation

## 2017-03-24 DIAGNOSIS — Z86711 Personal history of pulmonary embolism: Secondary | ICD-10-CM | POA: Diagnosis not present

## 2017-03-24 DIAGNOSIS — Z885 Allergy status to narcotic agent status: Secondary | ICD-10-CM | POA: Diagnosis not present

## 2017-03-24 MED ORDER — LISINOPRIL 20 MG PO TABS: 20.0000 mg | ORAL_TABLET | Freq: Every day | ORAL | 5 refills | Status: DC

## 2017-03-24 MED ORDER — POTASSIUM CHLORIDE CRYS ER 20 MEQ PO TBCR: 20.0000 meq | EXTENDED_RELEASE_TABLET | Freq: Every day | ORAL | 5 refills | Status: DC

## 2017-03-24 MED ORDER — FUROSEMIDE 40 MG PO TABS: 40.0000 mg | ORAL_TABLET | Freq: Every day | ORAL | 5 refills | Status: DC

## 2017-03-24 NOTE — Progress Notes (Signed)
MCKENZE, SLONE ([id number]) Visit Report for 03/21/2017 Arrival Information Details Patient Name: [name], [name] Date of Service: 03/21/2017 9:15 AM Medical Record Number: [id number] Patient Account Number: [id number] Date of Birth/Sex: 1965/10/01 (52 y.o. Female) Treating RN: Carolyne Fiscal, Debi Primary Care Iwao Shamblin: Jonna Clark Other Clinician: Referring Mivaan Corbitt: Jonna Clark Treating Alanya Vukelich/Extender: Melburn Hake, HOYT Weeks in Treatment: 9 Visit Information History Since Last Visit All ordered tests and consults were completed: No Patient Arrived: Ambulatory Added or deleted any medications: No Arrival Time: 09:19 Any new allergies or adverse reactions: No Accompanied By: daughter Had a fall or experienced change in No Transfer Assistance: None activities of daily living that may affect Patient Identification Verified: Yes risk of falls: Secondary Verification Process Completed: Yes Signs or symptoms of abuse/neglect since last visito No Patient Requires Transmission-Based No Hospitalized since last visit: No Precautions: Has Dressing in Place as Prescribed: Yes Patient Has Alerts: No Pain Present Now: Yes Electronic Signature(s) Signed: 03/23/2017 4:30:35 PM By: Alric Quan Entered By: Alric Quan on 03/21/2017 09:20:42 [name] ([id number]) -------------------------------------------------------------------------------- Encounter Discharge Information Details Patient Name: [name] Date of Service: 03/21/2017 9:15 AM Medical Record Number: [id number] Patient Account Number: [id number] Date of Birth/Sex: [birth year] (52 y.o. Female) Treating RN: Roger Shelter Primary Care Jonathen Rathman: Jonna Clark Other Clinician: Referring Ashtyn Freilich: Jonna Clark Treating Cortney Beissel/Extender: Melburn Hake, HOYT Weeks in Treatment: 9 Encounter Discharge Information Items Discharge Pain Level: 0 Discharge Condition: Stable Ambulatory Status: Ambulatory Discharge  Destination: Home Transportation: Private Auto Accompanied By: daughter Schedule Follow-up Appointment: Yes Medication Reconciliation completed and No provided to Patient/Care Chavonne Sforza: Provided on Clinical Summary of Care: 03/21/2017 Form Type Recipient Paper Patient DW Electronic Signature(s) Signed: 03/22/2017 8:07:32 AM By: Ruthine Dose Entered By: Ruthine Dose on 03/21/2017 09:49:45 Cuthbertson, Che[place] ([id number]) -------------------------------------------------------------------------------- Lower Extremity Assessment Details Patient Name: [name] Date of Service: 03/21/2017 9:15 AM Medical Record Number: [id number] Patient Account Number: [id number] Date of Birth/Sex: 1965/02/22 (52 y.o. Female) Treating RN: Carolyne Fiscal, Debi Primary Care Anjuli Gemmill: Jonna Clark Other Clinician: Referring Dannilynn Gallina: Jonna Clark Treating Leea Rambeau/Extender: STONE III, HOYT Weeks in Treatment: 9 Vascular Assessment Pulses: Dorsalis Pedis Doppler Audible: [Right:Yes] Posterior Tibial Extremity colors, hair growth, and conditions: Extremity Color: [Right:Normal] Temperature of Extremity: [Right:Cool] Capillary Refill: [Right:< 3 seconds] Toe Nail Assessment Left: Right: Thick: Yes Discolored: No Deformed: No Improper Length and Hygiene: Yes Electronic Signature(s) Signed: 03/23/2017 4:30:35 PM By: Alric Quan Entered By: Alric Quan on 03/21/2017 09:29:43 Shrieves, [name] ([id number]) -------------------------------------------------------------------------------- Multi Wound Chart Details Patient Name: [name] Date of Service: 03/21/2017 9:15 AM Medical Record Number: [id number] Patient Account Number: [id number] Date of Birth/Sex: [birth year] (52 y.o. Female) Treating RN: Roger Shelter Primary Care Abaigeal Moomaw: Jonna Clark Other Clinician: Referring Lucretia Pendley: Jonna Clark Treating Reatha Sur/Extender: STONE III, HOYT Weeks in Treatment: 9 Vital Signs Height(in):  70 Pulse(bpm): 49 Weight(lbs): 225 Blood Pressure(mmHg): 191/116 Body Mass Index(BMI): 32 Temperature(F): 97.7 Respiratory Rate 18 (breaths/min): Photos: [1:No Photos] [N/A:N/A] Wound Location: [1:Right Lower Leg - Anterior] [N/A:N/A] Wounding Event: [1:Trauma] [N/A:N/A] Primary Etiology: [1:Trauma, Other] [N/A:N/A] Comorbid History: [1:Chronic Obstructive Pulmonary Disease (COPD), Sleep Apnea, Arrhythmia, Congestive Heart Failure, Deep Vein Thrombosis, Hypertension, Gout, Rheumatoid Arthritis, Osteoarthritis, Confinement Anxiety] [N/A:N/A] Date Acquired: [1:12/09/2016] [N/A:N/A] Weeks of Treatment: [1:9] [N/A:N/A] Wound Status: [1:Open] [N/A:N/A] Measurements L x W x D [1:2.2x2x0.2] [N/A:N/A] (cm) Area (cm) : [1:3.456] [N/A:N/A] Volume (cm) : [1:0.691] [N/A:N/A] % Reduction in Area: [1:-61.80%] [N/A:N/A] % Reduction in Volume: [1:-7.80%] [N/A:N/A] Classification: [1:Full Thickness Without Exposed Support Structures] [N/A:N/A] Exudate Amount: [1:Large] [N/A:N/A] Exudate Type: [1:Serous] [N/A:N/A] Exudate  Color: [1:amber] [N/A:N/A] Wound Margin: [1:Flat and Intact] [N/A:N/A] Granulation Amount: [1:None Present (0%)] [N/A:N/A] Necrotic Amount: [1:Large (67-100%)] [N/A:N/A] Exposed Structures: [1:Fat Layer (Subcutaneous Tissue) Exposed: Yes Fascia: No Tendon: No Muscle: No Joint: No Bone: No] [N/A:N/A] Epithelialization: None N/A N/A Periwound Skin Texture: Excoriation: No N/A N/A Induration: No Callus: No Crepitus: No Rash: No Scarring: No Periwound Skin Moisture: Maceration: Yes N/A N/A Dry/Scaly: No Periwound Skin Color: Hemosiderin Staining: Yes N/A N/A Atrophie Blanche: No Cyanosis: No Ecchymosis: No Erythema: No Mottled: No Pallor: No Rubor: No Temperature: No Abnormality N/A N/A Tenderness on Palpation: Yes N/A N/A Wound Preparation: Ulcer Cleansing: N/A N/A Rinsed/Irrigated with Saline Topical Anesthetic Applied: Other: lidocaine 4% Treatment  Notes Electronic Signature(s) Signed: 03/21/2017 4:14:37 PM By: Roger Shelter Entered By: Roger Shelter on 03/21/2017 09:36:03 Brenton, [name] ([id number]) -------------------------------------------------------------------------------- [place] Details Patient Name: [name] Date of Service: 03/21/2017 9:15 AM Medical Record Number: [id number] Patient Account Number: [id number] Date of Birth/Sex: 1965-02-13 (52 y.o. Female) Treating RN: Roger Shelter Primary Care Charlye Spare: Jonna Clark Other Clinician: Referring Traveon Louro: Jonna Clark Treating Raider Valbuena/Extender: Melburn Hake, HOYT Weeks in Treatment: 9 Active Inactive ` Orientation to the Wound Care Program Nursing Diagnoses: Knowledge deficit related to the wound healing center program Goals: Patient/caregiver will verbalize understanding of the [place] Program Date Initiated: 01/12/2017 Target Resolution Date: 02/13/2016 Goal Status: Active Interventions: Provide education on orientation to the wound center Notes: ` Wound/Skin Impairment Nursing Diagnoses: Impaired tissue integrity Knowledge deficit related to ulceration/compromised skin integrity Goals: Patient/caregiver will verbalize understanding of skin care regimen Date Initiated: 01/12/2017 Target Resolution Date: 02/12/2017 Goal Status: Active Ulcer/skin breakdown will have a volume reduction of 30% by week 4 Date Initiated: 01/12/2017 Target Resolution Date: 02/12/2017 Goal Status: Active Interventions: Assess patient/caregiver ability to obtain necessary supplies Assess ulceration(s) every visit Provide education on ulcer and skin care Treatment Activities: Skin care regimen initiated : 01/12/2017 Notes: Electronic Signature(s) Signed: 03/21/2017 4:14:37 PM By: Viona Gilmore, [name] ([id number]) Entered By: Roger Shelter on 03/21/2017 09:35:50 Constante, [name]  ([id number]) -------------------------------------------------------------------------------- Pain Assessment Details Patient Name: [name] Date of Service: 03/21/2017 9:15 AM Medical Record Number: [id number] Patient Account Number: [id number] Date of Birth/Sex: [birth year] (52 y.o. Female) Treating RN: Carolyne Fiscal, Debi Primary Care Ameliarose Shark: Jonna Clark Other Clinician: Referring Pailynn Vahey: Jonna Clark Treating Jaevian Shean/Extender: STONE III, HOYT Weeks in Treatment: 9 Active Problems Location of Pain Severity and Description of Pain Patient Has Paino Yes Site Locations Rate the pain. Current Pain Level: 8 Character of Pain Describe the Pain: Other: stinging Pain Management and Medication Current Pain Management: Notes Topical or injectable lidocaine is offered to patient for acute pain when surgical debridement is performed. If needed, Patient is instructed to use over the counter pain medication for the following 24-48 hours after debridement. Wound care MDs do not prescribed pain medications. Patient has chronic pain or uncontrolled pain. Patient has been instructed to make an appointment with their Primary Care Physician for pain management. Electronic Signature(s) Signed: 03/23/2017 4:30:35 PM By: Alric Quan Entered By: Alric Quan on 03/21/2017 09:21:17 [name] ([id number]) -------------------------------------------------------------------------------- Patient/Caregiver Education Details Patient Name: [name] Date of Service: 03/21/2017 9:15 AM Medical Record Number: [id number] Patient Account Number: [id number] Date of Birth/Gender: 08/12/1965 (52 y.o. Female) Treating RN: Roger Shelter Primary Care Physician: Jonna Clark Other Clinician: Referring Physician: Jonna Clark Treating Physician/Extender: Sharalyn Ink in Treatment: 9 Education Assessment Education Provided To: Patient Education Topics Provided Wound  Debridement: Handouts: Wound Debridement Methods: Explain/Verbal Responses:  State content correctly Wound/Skin Impairment: Handouts: Caring for Your Ulcer Methods: Explain/Verbal Responses: State content correctly Electronic Signature(s) Signed: 03/21/2017 4:14:37 PM By: Roger Shelter Entered By: Roger Shelter on 03/21/2017 09:47:46 Mcnew, [place] ([id number]) -------------------------------------------------------------------------------- Wound Assessment Details Patient Name: [name] Date of Service: 03/21/2017 9:15 AM Medical Record Number: [id number] Patient Account Number: [id number] Date of Birth/Sex: 1965/05/31 (52 y.o. Female) Treating RN: Carolyne Fiscal, Debi Primary Care Ladell Lea: Jonna Clark Other Clinician: Referring Marcos Ruelas: Jonna Clark Treating Maryem Shuffler/Extender: STONE III, HOYT Weeks in Treatment: 9 Wound Status Wound Number: 1 Primary Trauma, Other Etiology: Wound Location: Right Lower Leg - Anterior Wound Open Wounding Event: Trauma Status: Date Acquired: 12/09/2016 Comorbid Chronic Obstructive Pulmonary Disease (COPD), Weeks Of Treatment: 9 History: Sleep Apnea, Arrhythmia, Congestive Heart Clustered Wound: No Failure, Deep Vein Thrombosis, Hypertension, Gout, Rheumatoid Arthritis, Osteoarthritis, Confinement Anxiety Photos Photo Uploaded By: Roger Shelter on 03/21/2017 16:14:12 Wound Measurements Length: (cm) 2.2 Width: (cm) 2 Depth: (cm) 0.2 Area: (cm) 3.456 Volume: (cm) 0.691 % Reduction in Area: -61.8% % Reduction in Volume: -7.8% Epithelialization: None Tunneling: No Undermining: No Wound Description Full Thickness Without Exposed Support Classification: Structures Wound Margin: Flat and Intact Exudate Large Amount: Exudate Type: Serous Exudate Color: amber Foul Odor After Cleansing: No Slough/Fibrino Yes Wound Bed Granulation Amount: None Present (0%) Exposed Structure Necrotic Amount: Large (67-100%) Fascia  Exposed: No Necrotic Quality: Adherent Slough Fat Layer (Subcutaneous Tissue) Exposed: Yes Tendon Exposed: No Muscle Exposed: No Thomley, Marshella ([id number]) Joint Exposed: No Bone Exposed: No Periwound Skin Texture Texture Color No Abnormalities Noted: No No Abnormalities Noted: No Callus: No Atrophie Blanche: No Crepitus: No Cyanosis: No Excoriation: No Ecchymosis: No Induration: No Erythema: No Rash: No Hemosiderin Staining: Yes Scarring: No Mottled: No Pallor: No Moisture Rubor: No No Abnormalities Noted: No Dry / Scaly: No Temperature / Pain Maceration: Yes Temperature: No Abnormality Tenderness on Palpation: Yes Wound Preparation Ulcer Cleansing: Rinsed/Irrigated with Saline Topical Anesthetic Applied: Other: lidocaine 4%, Treatment Notes Wound #1 (Right, Anterior Lower Leg) 1. Cleansed with: Clean wound with Normal Saline 2. Anesthetic Topical Lidocaine 4% cream to wound bed prior to debridement 4. Dressing Applied: Iodoflex 5. Secondary Dressing Applied Bordered Foam Dressing Electronic Signature(s) Signed: 03/23/2017 4:30:35 PM By: Alric Quan Entered By: Alric Quan on 03/21/2017 93:26:71 [name] ([id number]) -------------------------------------------------------------------------------- [place] Details Patient Name: [name] Date of Service: 03/21/2017 9:15 AM Medical Record Number: [id number] Patient Account Number: [id number] Date of Birth/Sex: [birth year] (52 y.o. Female) Treating RN: Carolyne Fiscal, Debi Primary Care Alexismarie Flaim: Jonna Clark Other Clinician: Referring Brevyn Ring: Jonna Clark Treating Cadey Bazile/Extender: STONE III, HOYT Weeks in Treatment: 9 Vital Signs Time Taken: 09:21 Temperature (F): 97.7 Height (in): 70 Pulse (bpm): 67 Weight (lbs): 225 Respiratory Rate (breaths/min): 18 Body Mass Index (BMI): 32.3 Blood Pressure (mmHg): 191/116 Reference Range: 80 - 120 mg / dl Notes Pt states that this BP is normal  for her and she is out of BP medication. I told her to call her PCP and I made Margarita Grizzle, PA-C aware. Electronic Signature(s) Signed: 03/23/2017 4:30:35 PM By: Alric Quan Entered By: Alric Quan on 03/21/2017 09:26:33

## 2017-03-24 NOTE — Patient Instructions (Addendum)
Continue weighing daily and call for an overnight weight gain of > 2 pounds or a weekly weight gain of >5 pounds.  Call [facility] (transportation) [phone]

## 2017-03-28 ENCOUNTER — Encounter: Payer: Medicaid Other | Attending: Physician Assistant | Admitting: Physician Assistant

## 2017-03-28 DIAGNOSIS — I110 Hypertensive heart disease with heart failure: Secondary | ICD-10-CM | POA: Insufficient documentation

## 2017-03-28 DIAGNOSIS — Z8673 Personal history of transient ischemic attack (TIA), and cerebral infarction without residual deficits: Secondary | ICD-10-CM | POA: Diagnosis not present

## 2017-03-28 DIAGNOSIS — J449 Chronic obstructive pulmonary disease, unspecified: Secondary | ICD-10-CM | POA: Insufficient documentation

## 2017-03-28 DIAGNOSIS — I509 Heart failure, unspecified: Secondary | ICD-10-CM | POA: Diagnosis not present

## 2017-03-28 DIAGNOSIS — I87331 Chronic venous hypertension (idiopathic) with ulcer and inflammation of right lower extremity: Secondary | ICD-10-CM | POA: Insufficient documentation

## 2017-03-28 DIAGNOSIS — M069 Rheumatoid arthritis, unspecified: Secondary | ICD-10-CM | POA: Diagnosis not present

## 2017-03-28 DIAGNOSIS — Z87891 Personal history of nicotine dependence: Secondary | ICD-10-CM | POA: Insufficient documentation

## 2017-03-28 DIAGNOSIS — L97812 Non-pressure chronic ulcer of other part of right lower leg with fat layer exposed: Secondary | ICD-10-CM | POA: Insufficient documentation

## 2017-03-28 DIAGNOSIS — L03115 Cellulitis of right lower limb: Secondary | ICD-10-CM | POA: Diagnosis not present

## 2017-03-28 DIAGNOSIS — Z86718 Personal history of other venous thrombosis and embolism: Secondary | ICD-10-CM | POA: Diagnosis not present

## 2017-03-28 DIAGNOSIS — G4730 Sleep apnea, unspecified: Secondary | ICD-10-CM | POA: Insufficient documentation

## 2017-03-30 NOTE — Progress Notes (Deleted)
HPI   Ms Ayllon is a 52 y/o female with a history of pulmonary embolus, obstructive sleep apnea, atrial fibrillation, COPD, gout, HTN, CVA, remote tobacco use and chronic heart failure.   Echo report from 06/17/16 reviewed and showed an EF of 55-65%. Had a pharmacologic stress trest done 09/12/12 and had an estimated EF of 49%.   Was in the ED 02/19/17 due to gout where she was treated and released. Was in the ED 12/30/16 due to cellulitis of lower leg. Was treated with antibiotics and released.   She presents today for a follow-up visit with a chief complaint of   Past Medical History:  Diagnosis Date  . A-fib ([place]) 2010  . CHF (congestive heart failure) ([place])   . COPD (chronic obstructive pulmonary disease) ([place])   . Gout   . Hypertension   . Non-healing non-surgical wound    right leg  . Pulmonary embolus ([place])   . Stroke [facility])    Past Surgical History:  Procedure Laterality Date  . bilateral wrist fractures  2010  . lt ankle fracture  2002   Family History  Problem Relation Age of Onset  . Prostate cancer Father   . Rectal cancer Sister   . Breast cancer Sister    Social History   Tobacco Use  . Smoking status: Former Smoker    Types: Cigarettes    Last attempt to quit: 01/26/2016    Years since quitting: 1.1  . Smokeless tobacco: Never Used  Substance Use Topics  . Alcohol use: Yes    Comment: occ   Allergies  Allergen Reactions  . Morphine And Related Hives  . Penicillins Other (See Comments)    Painful urination Has patient had a PCN reaction causing immediate rash, facial/tongue/throat swelling, SOB or lightheadedness with hypotension: yes Has patient had a PCN reaction causing severe rash involving mucus membranes or skin necrosis: no Has patient had a PCN reaction that required hospitalization no Has patient had a PCN reaction occurring within the last 10 years: no If all of the above answers are "NO", then may proceed with Cephalosporin use.      Review  of Systems  Constitutional: Negative for appetite change and fatigue.  HENT: Negative for congestion, postnasal drip and sore throat.   Eyes: Negative.   Respiratory: Negative for cough, chest tightness and shortness of breath.   Cardiovascular: Positive for leg swelling (around ankles). Negative for chest pain and palpitations.  Gastrointestinal: Negative for abdominal distention, abdominal pain and nausea.  Endocrine: Negative.   Genitourinary: Negative.   Musculoskeletal: Negative for back pain and neck pain.  Skin: Negative.   Allergic/Immunologic: Negative.   Neurological: Negative for dizziness, light-headedness and headaches.  Hematological: Negative for adenopathy. Does not bruise/bleed easily.  Psychiatric/Behavioral: Negative for dysphoric mood and sleep disturbance (wearing CPAP nightly; sleeping on 2 pillows). The patient is not nervous/anxious.      Physical Exam  Constitutional: She is oriented to person, place, and time. She appears well-developed and well-nourished.  HENT:  Head: Normocephalic and atraumatic.  Neck: Normal range of motion. Neck supple. No JVD present.  Cardiovascular: Normal rate and regular rhythm.  Pulmonary/Chest: Effort normal. She has no wheezes. She has no rales.  Abdominal: Soft. She exhibits no distension. There is no tenderness.  Musculoskeletal: She exhibits edema (trace edema around ankles). She exhibits no tenderness.  Neurological: She is alert and oriented to person, place, and time.  Skin: Skin is warm and dry.  Psychiatric: She has  a normal mood and affect. Her behavior is normal. Thought content normal.  Nursing note and vitals reviewed.   Assessment & Plan:  1: Chronic heart failure with preserved ejection fraction- - NYHA class I - euvolemic today -  weighing daily. She is to call for an overnight weight gain of >2 pounds or a weekly weight gain of >5 pounds.  - not adding salt to her food. Reminded to closely follow a 2000mg   sodium diet  -  - saw cardiology Margarito Courser) 06/18/16 - does not get the flu vaccine; encouraged good handwashing  2: HTN- - BP elevated although she's been out of all her medications for the last 2 weeks -  - sees new PCP Orene Desanctis) 04/07/17 - reviewed BMP done 02/19/17 and it showed sodium 143, potassium 3.3 and GFR >60 - will get a BMP today since lisinopril, furosemide and potassium were all started at her last visit

## 2017-03-31 ENCOUNTER — Telehealth: Payer: Self-pay | Admitting: Family

## 2017-03-31 ENCOUNTER — Ambulatory Visit: Payer: Medicaid Other | Admitting: Family

## 2017-03-31 NOTE — Progress Notes (Signed)
ALEIA, LAROCCA ([id number]) Visit Report for 03/28/2017 Chief Complaint Document Details Patient Name: [name], [name] Date of Service: 03/28/2017 9:30 AM Medical Record Number: [id number] Patient Account Number: [id number] Date of Birth/Sex: Oct 12, 1965 (52 y.o. Female) Treating RN: Roger Shelter Primary Care Provider: Jonna Clark Other Clinician: Referring Provider: Jonna Clark Treating Provider/Extender: Melburn Hake, Dantavious Snowball Weeks in Treatment: 10 Information Obtained from: Patient Chief Complaint Right LE Ulcer Electronic Signature(s) Signed: 03/28/2017 3:03:07 PM By: Worthy Keeler PA-C Entered By: Worthy Keeler on 03/28/2017 08:58:51 Newton, [name] ([id number]) -------------------------------------------------------------------------------- Debridement Details Patient Name: [name] Date of Service: 03/28/2017 9:30 AM Medical Record Number: [id number] Patient Account Number: [id number] Date of Birth/Sex: [birth year] (52 y.o. Female) Treating RN: Roger Shelter Primary Care Provider: Jonna Clark Other Clinician: Referring Provider: Jonna Clark Treating Provider/Extender: STONE III, Tarquin Welcher Weeks in Treatment: 10 Debridement Performed for Wound #1 Right,Anterior Lower Leg Assessment: Performed By: Physician STONE III, Shoji Pertuit E., PA-C Debridement: Debridement Pre-procedure Verification/Time Yes - 09:10 Out Taken: Start Time: 09:10 Pain Control: Other : lidocaine 4% Level: Skin/Subcutaneous Tissue Total Area Debrided (L x W): 2.5 (cm) x 2.5 (cm) = 6.25 (cm) Tissue and other material Viable, Non-Viable, Fibrin/Slough, Subcutaneous debrided: Instrument: Curette Bleeding: Moderate Hemostasis Achieved: Pressure End Time: 09:11 Procedural Pain: 0 Post Procedural Pain: 0 Response to Treatment: Procedure was tolerated well Post Debridement Measurements of Total Wound Length: (cm) 2.5 Width: (cm) 2.5 Depth: (cm) 0.2 Volume: (cm) 0.982 Character of Wound/Ulcer Post  Debridement: Stable Post Procedure Diagnosis Same as Pre-procedure Electronic Signature(s) Signed: 03/28/2017 3:03:07 PM By: Worthy Keeler PA-C Signed: 03/30/2017 4:15:18 PM By: Roger Shelter Entered By: Roger Shelter on 03/28/2017 09:11:55 Lavallie, [name] ([id number]) -------------------------------------------------------------------------------- HPI Details Patient Name: [name] Date of Service: 03/28/2017 9:30 AM Medical Record Number: [id number] Patient Account Number: [id number] Date of Birth/Sex: Dec 15, 1965 (52 y.o. Female) Treating RN: Roger Shelter Primary Care Provider: Jonna Clark Other Clinician: Referring Provider: Jonna Clark Treating Provider/Extender: Melburn Hake, Olajuwon Fosdick Weeks in Treatment: 10 History of Present Illness HPI Description: 01/12/17; this is a 52 year old nondiabetic remote ex-smoker. She tells me that sometime in early November she developed a sudden painful area on her right medial lower leg while she was in bed at night she reached down and scratch the area and woke up in the morning with a scab small wound. This gradually got worse over time. She was seen in the ER on 12/18/16. At that point the possibility of an insect bite/brown recluse spider bite was brought up. At that point it was 1 cm eschar covered wound. She was treated with Bactrim DS. She was back in the ER on 01/09/17 with increasing size pain of the wound. At this point the patient states it was covered with a black eschar at surface. An x-ray showed punctate radio density material within the wound but no osteomyelitis. 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 She was put on clindamycin. The patient was seen on 12/7 by vascular surgery. At that point noting a 3 x 3 cm necrotic wound. ABIs were ordered. She was treated with Silvadene cream. I don't see the results of the ABIs at this point. She is using Silvadene cream. A comprehensive metabolic panel CBC and differential PT  and PTT were done which were also normal. 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 COPD/asthma/depression and a history of atrial fib. She tells me she had a history of vein stripping many years ago ABIs in our clinic were noncompressible on  the right at 1.59 01/19/17; right medial lower leg wound. Small wound with some depth. Still requiring debridement we are using Santyl. She has vascular studies on January 9 but at this point I can't tell whether these are arterial venous or both. They are at [facility] 01/27/16; right medial lower leg wound. She has arterial and venous studies later this month. The wound looks somewhat better after debridement. We've been using Santyl. 02/08/17 on evaluation today patient has had for arterial studies performed. This showed noncompressible measurements in regard to the ABI although she did have tried phasic flow and it stated in the report that she did have good TBI's. With that being said I see no documentation of TBI's and I cannot find the final report in epic only see what the patient brought in to show me today. We obviously are going to need to contact the office to see about getting the final report. We have been using Santyl and this does seem to be helping the wound to a degree. Patient is having no significant pain. 03/21/17 on evaluation today patient appears to be a little bit larger in regard to her ulcer. It has actually been almost a month since we have last seen her. She states that she has continued to use the [place] during that time. With that being said she is noting that she does have "pus" noted on the surface of the wound. Upon further inspection it actually appears that this is more slough and not pass noted at this point. There is no erythema surrounding the wound and nothing that would suggest that she is having an infection at this point. Her blood pressure is significantly high at this  time. She states that she is just about out of blood pressure medication obviously we did tell her we cannot prescribed this needs to see her primary care provider as soon as possible. 03/28/17 on evaluation today patient appears to be doing better in regard to the overall appearance of her wound. She continues to have measurements be about the same although honestly she has much less slough noted than previously. Fortunately she is having no evidence of infection systemically at this point. There does not appear to be any evidence of increased pain which is also good news. Electronic Signature(s) Signed: 03/28/2017 3:03:07 PM By: Worthy Keeler PA-C Entered By: Worthy Keeler on 03/28/2017 09:18:23 [name], [name] ([id number], [name] ([id number]) -------------------------------------------------------------------------------- Physical Exam Details Patient Name: [name] Date of Service: 03/28/2017 9:30 AM Medical Record Number: [id number] Patient Account Number: [id number] Date of Birth/Sex: 07/24/1965 (52 y.o. Female) Treating RN: Roger Shelter Primary Care Provider: Jonna Clark Other Clinician: Referring Provider: Jonna Clark Treating Provider/Extender: STONE III, Colden Samaras Weeks in Treatment: 57 Constitutional Well-nourished and well-hydrated in no acute distress. Respiratory normal breathing without difficulty. Cardiovascular 1+ pitting edema of the bilateral lower extremities. Psychiatric this patient is able to make decisions and demonstrates good insight into disease process. Alert and Oriented x 3. pleasant and cooperative. Notes At this point I'm going to suggest in regard to the wound that we do proceed with additional sharp debridement which was performed today due to the amount of slough covering the wound bed. This appeared to be much looser then during last week's evaluation and she tolerated the debridement well without complication. Post debridement the  wound bed appear to be doing excellent in this did not cause as much discomfort as it did previously. All of this I take to  be a good sign. Electronic Signature(s) Signed: 03/28/2017 3:03:07 PM By: Worthy Keeler PA-C Entered By: Worthy Keeler on 03/28/2017 09:19:24 [name] ([id number]) -------------------------------------------------------------------------------- Physician Orders Details Patient Name: [name] Date of Service: 03/28/2017 9:30 AM Medical Record Number: [id number] Patient Account Number: [id number] Date of Birth/Sex: Jun 21, 1965 (52 y.o. Female) Treating RN: Roger Shelter Primary Care Provider: Jonna Clark Other Clinician: Referring Provider: Jonna Clark Treating Provider/Extender: Melburn Hake, Cortavius Montesinos Weeks in Treatment: 10 Verbal / Phone Orders: No Diagnosis Coding ICD-10 Coding Code Description [phone] Non-pressure chronic ulcer of right calf with fat layer exposed I87.331 Chronic venous hypertension (idiopathic) with ulcer and inflammation of right lower extremity L03.115 Cellulitis of right lower limb Wound Cleansing Wound #1 Right,Anterior Lower Leg o Clean wound with Normal Saline. Anesthetic (add to Medication List) Wound #1 Right,Anterior Lower Leg o Topical Lidocaine 4% cream applied to wound bed prior to debridement (In Clinic Only). Primary Wound Dressing Wound #1 Right,Anterior Lower Leg o Iodoflex Secondary Dressing Wound #1 Right,Anterior Lower Leg o Boardered Foam Dressing Dressing Change Frequency Wound #1 Right,Anterior Lower Leg o Change dressing every other day. Follow-up Appointments o Return Appointment in 1 week. Additional Orders / Instructions o Vitamin A; Vitamin C, Zinc o Increase protein intake. Electronic Signature(s) Signed: 03/28/2017 3:03:07 PM By: Worthy Keeler PA-C Signed: 03/30/2017 4:15:18 PM By: Roger Shelter Entered By: Roger Shelter on 03/28/2017 09:12:24 [name]  ([id number]) -------------------------------------------------------------------------------- Problem List Details Patient Name: [name] Date of Service: 03/28/2017 9:30 AM Medical Record Number: [id number] Patient Account Number: [id number] Date of Birth/Sex: [birth year] (52 y.o. Female) Treating RN: Roger Shelter Primary Care Provider: Jonna Clark Other Clinician: Referring Provider: Jonna Clark Treating Provider/Extender: Melburn Hake, Rocko Fesperman Weeks in Treatment: 10 Active Problems ICD-10 Encounter Code Description Active Date Diagnosis L97.212 Non-pressure chronic ulcer of right calf with fat layer exposed 01/12/2017 Yes I87.331 Chronic venous hypertension (idiopathic) with ulcer and 01/12/2017 Yes inflammation of right lower extremity L03.115 Cellulitis of right lower limb 01/12/2017 Yes Inactive Problems Resolved Problems Electronic Signature(s) Signed: 03/28/2017 3:03:07 PM By: Worthy Keeler PA-C Entered By: Worthy Keeler on 03/28/2017 08:58:46 [name], [name] ([id number]) -------------------------------------------------------------------------------- Progress Note Details Patient Name: [name] Date of Service: 03/28/2017 9:30 AM Medical Record Number: [id number] Patient Account Number: [id number] Date of Birth/Sex: 09-02-1965 (52 y.o. Female) Treating RN: Roger Shelter Primary Care Provider: Jonna Clark Other Clinician: Referring Provider: Jonna Clark Treating Provider/Extender: Melburn Hake, Allanah Mcfarland Weeks in Treatment: 10 Subjective Chief Complaint Information obtained from Patient Right LE Ulcer History of Present Illness (HPI) 01/12/17; this is a 52 year old nondiabetic remote ex-smoker. She tells me that sometime in early November she developed a sudden painful area on her right medial lower leg while she was in bed at night she reached down and scratch the area and woke up in the morning with a scab small wound. This gradually got worse over time. She  was seen in the ER on 12/18/16. At that point the possibility of an insect bite/brown recluse spider bite was brought up. At that point it was 1 cm eschar covered wound. She was treated with Bactrim DS. She was back in the ER on 01/09/17 with increasing size pain of the wound. At this point the patient states it was covered with a black eschar at surface. An x-ray showed punctate radio density material within the wound but no osteomyelitis. 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 She was put on clindamycin. The patient was seen on 12/7 by vascular surgery.  At that point noting a 3 x 3 cm necrotic wound. ABIs were ordered. She was treated with Silvadene cream. I don't see the results of the ABIs at this point. She is using Silvadene cream. A comprehensive metabolic panel CBC and differential PT and PTT were done which were also normal. 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 COPD/asthma/depression and a history of atrial fib. She tells me she had a history of vein stripping many years ago ABIs in our clinic were noncompressible on the right at 1.59 01/19/17; right medial lower leg wound. Small wound with some depth. Still requiring debridement we are using Santyl. She has vascular studies on January 9 but at this point I can't tell whether these are arterial venous or both. They are at [facility] 01/27/16; right medial lower leg wound. She has arterial and venous studies later this month. The wound looks somewhat better after debridement. We've been using Santyl. 02/08/17 on evaluation today patient has had for arterial studies performed. This showed noncompressible measurements in regard to the ABI although she did have tried phasic flow and it stated in the report that she did have good TBI's. With that being said I see no documentation of TBI's and I cannot find the final report in epic only see what the patient brought in to show me  today. We obviously are going to need to contact the office to see about getting the final report. We have been using Santyl and this does seem to be helping the wound to a degree. Patient is having no significant pain. 03/21/17 on evaluation today patient appears to be a little bit larger in regard to her ulcer. It has actually been almost a month since we have last seen her. She states that she has continued to use the [place] during that time. With that being said she is noting that she does have "pus" noted on the surface of the wound. Upon further inspection it actually appears that this is more slough and not pass noted at this point. There is no erythema surrounding the wound and nothing that would suggest that she is having an infection at this point. Her blood pressure is significantly high at this time. She states that she is just about out of blood pressure medication obviously we did tell her we cannot prescribed this needs to see her primary care provider as soon as possible. 03/28/17 on evaluation today patient appears to be doing better in regard to the overall appearance of her wound. She continues to have measurements be about the same although honestly she has much less slough noted than previously. Fortunately she is having no evidence of infection systemically at this point. There does not appear to be any evidence of increased pain which is also good news. [place]rop, [name] ([id number]) Patient History Information obtained from Patient. Family History Cancer - Siblings, No family history of Diabetes, Heart Disease, Hypertension, Kidney Disease, Lung Disease, Seizures, Stroke, Thyroid Problems, Tuberculosis. Social History Former smoker - stopped 6 years ago, Marital Status - Separated, Alcohol Use - Rarely, Drug Use - No History, Caffeine Use - Moderate. Review of Systems (ROS) Constitutional Symptoms (General Health) Denies complaints or symptoms of Fever,  Chills. Respiratory The patient has no complaints or symptoms. Cardiovascular Complains or has symptoms of LE edema. Psychiatric The patient has no complaints or symptoms. Objective Constitutional Well-nourished and well-hydrated in no acute distress. Vitals Time Taken: 8:55 AM, Height: 70 in, Weight:  225 lbs, BMI: 32.3, Temperature: 98.1 F, Pulse: 82 bpm, Respiratory Rate: 18 breaths/min, Blood Pressure: 160/110 mmHg. Respiratory normal breathing without difficulty. Cardiovascular 1+ pitting edema of the bilateral lower extremities. Psychiatric this patient is able to make decisions and demonstrates good insight into disease process. Alert and Oriented x 3. pleasant and cooperative. General Notes: At this point I'm going to suggest in regard to the wound that we do proceed with additional sharp debridement which was performed today due to the amount of slough covering the wound bed. This appeared to be much looser then during last week's evaluation and she tolerated the debridement well without complication. Post debridement the wound bed appear to be doing excellent in this did not cause as much discomfort as it did previously. All of this I take to be a good sign. Integumentary (Hair, Skin) Monreal, [name] ([id number]) Wound #1 status is Open. Original cause of wound was Trauma. The wound is located on the Right,Anterior Lower Leg. The wound measures 2.5cm length x 2.5cm width x 0.2cm depth; 4.909cm^2 area and 0.982cm^3 volume. There is Fat Layer (Subcutaneous Tissue) Exposed exposed. There is no tunneling or undermining noted. There is a large amount of serous drainage noted. The wound margin is flat and intact. There is no granulation within the wound bed. There is a large (67-100%) amount of necrotic tissue within the wound bed including Adherent Slough. The periwound skin appearance exhibited: Maceration, Hemosiderin Staining. The periwound skin appearance did not exhibit:  Callus, Crepitus, Excoriation, Induration, Rash, Scarring, Dry/Scaly, Atrophie Blanche, Cyanosis, Ecchymosis, Mottled, Pallor, Rubor, Erythema. Periwound temperature was noted as No Abnormality. The periwound has tenderness on palpation. Assessment Active Problems ICD-10 L97.212 - Non-pressure chronic ulcer of right calf with fat layer exposed I87.331 - Chronic venous hypertension (idiopathic) with ulcer and inflammation of right lower extremity L03.115 - Cellulitis of right lower limb Procedures Wound #1 Pre-procedure diagnosis of Wound #1 is a Trauma, Other located on the Right,Anterior Lower Leg . There was a Skin/Subcutaneous Tissue Debridement ([id number]) debridement with total area of 6.25 sq cm performed by STONE III, Daijanae Rafalski E., PA-C. with the following instrument(s): Curette to remove Viable and Non-Viable tissue/material including Fibrin/Slough and Subcutaneous after achieving pain control using Other (lidocaine 4%). A time out was conducted at 09:10, prior to the start of the procedure. A Moderate amount of bleeding was controlled with Pressure. The procedure was tolerated well with a pain level of 0 throughout and a pain level of 0 following the procedure. Post Debridement Measurements: 2.5cm length x 2.5cm width x 0.2cm depth; 0.982cm^3 volume. Character of Wound/Ulcer Post Debridement is stable. Post procedure Diagnosis Wound #1: Same as Pre-Procedure Plan Wound Cleansing: Wound #1 Right,Anterior Lower Leg: Clean wound with Normal Saline. Anesthetic (add to Medication List): Wound #1 Right,Anterior Lower Leg: Topical Lidocaine 4% cream applied to wound bed prior to debridement (In Clinic Only). Primary Wound Dressing: Wound #1 Right,Anterior Lower Leg: Iodoflex Secondary Dressing: Wound #1 Right,Anterior Lower Leg: Boardered Foam Dressing [place], [name] ([id number]) Dressing Change Frequency: Wound #1 Right,Anterior Lower Leg: Change dressing every other  day. Follow-up Appointments: Return Appointment in 1 week. Additional Orders / Instructions: Vitamin A; Vitamin C, Zinc Increase protein intake. I'm gonna recommend that we continue with the Current wound care measures for one additional week and then we will subsequently see were things stand following. I will most likely switch the dressing next time to something different such as Prisma see if this will be of greater benefit for her. We  are also going to order the Juxta-Lite compression. Please see above for specific wound care orders. We will see patient for re-evaluation in 1 week(s) here in the clinic. If anything worsens or changes patient will contact our office for additional recommendations. Electronic Signature(s) Signed: 03/28/2017 3:03:07 PM By: Worthy Keeler PA-C Entered By: Worthy Keeler on 03/28/2017 09:20:05 [name], [name] ([id number]) -------------------------------------------------------------------------------- ROS/PFSH Details Patient Name: [name] Date of Service: 03/28/2017 9:30 AM Medical Record Number: [id number] Patient Account Number: [id number] Date of Birth/Sex: March 04, 1965 (52 y.o. Female) Treating RN: Roger Shelter Primary Care Provider: Jonna Clark Other Clinician: Referring Provider: Jonna Clark Treating Provider/Extender: Melburn Hake, Keison Glendinning Weeks in Treatment: 10 Information Obtained From Patient Wound History Do you currently have one or more open woundso Yes How many open wounds do you currently haveo 1 Approximately how long have you had your woundso one month How have you been treating your wound(s) until nowo 3 weeks Has your wound(s) ever healed and then re-openedo Yes Have you had any lab work done in the past montho Yes Have you tested positive for an antibiotic resistant organism (MRSA, VRE)o No Have you tested positive for osteomyelitis (bone infection)o No Have you had any tests for circulation on your legso Yes Where was the  test doneo [facility] Have you had other problems associated with your woundso Infection Constitutional Symptoms (General Health) Complaints and Symptoms: Negative for: Fever; Chills Cardiovascular Complaints and Symptoms: Positive for: LE edema Medical History: Positive for: Arrhythmia; Congestive Heart Failure; Deep Vein Thrombosis - in lower legs and history of varicose veins; Hypertension Negative for: Angina; Coronary Artery Disease; Hypotension; Myocardial Infarction; Peripheral Arterial Disease; Peripheral Venous Disease; Phlebitis; Vasculitis Eyes Medical History: Negative for: Cataracts; Glaucoma; Optic Neuritis Ear/Nose/Mouth/Throat Medical History: Negative for: Chronic sinus problems/congestion; Middle ear problems Hematologic/Lymphatic Medical History: Negative for: Anemia; Hemophilia; Human Immunodeficiency Virus; Lymphedema; Sickle Cell Disease Respiratory [name], [name] ([id number]) Complaints and Symptoms: No Complaints or Symptoms Medical History: Positive for: Chronic Obstructive Pulmonary Disease (COPD); Sleep Apnea Negative for: Aspiration; Asthma; Pneumothorax; Tuberculosis Gastrointestinal Medical History: Negative for: Cirrhosis ; Colitis; Crohnos; Hepatitis A; Hepatitis B; Hepatitis C Endocrine Medical History: Negative for: Type I Diabetes; Type II Diabetes Genitourinary Medical History: Negative for: End Stage Renal Disease Immunological Medical History: Negative for: Lupus Erythematosus; Raynaudos; Scleroderma Integumentary (Skin) Medical History: Negative for: History of Burn; History of pressure wounds Musculoskeletal Medical History: Positive for: Gout; Rheumatoid Arthritis; Osteoarthritis Negative for: Osteomyelitis Neurologic Medical History: Negative for: Dementia; Neuropathy; Quadriplegia; Paraplegia; Seizure Disorder Oncologic Medical History: Negative for: Received Chemotherapy; Received Radiation Psychiatric Complaints and  Symptoms: No Complaints or Symptoms Medical History: Positive for: Confinement Anxiety Negative for: Anorexia/bulimia Immunizations Pneumococcal Vaccine: [name], [name] ([id number]) Received Pneumococcal Vaccination: No Implantable Devices Family and Social History Cancer: Yes - Siblings; Diabetes: No; Heart Disease: No; Hypertension: No; Kidney Disease: No; Lung Disease: No; Seizures: No; Stroke: No; Thyroid Problems: No; Tuberculosis: No; Former smoker - stopped 6 years ago; Marital Status - Separated; Alcohol Use: Rarely; Drug Use: No History; Caffeine Use: Moderate; Financial Concerns: No; Food, Clothing or Shelter Needs: No; Support System Lacking: No; Transportation Concerns: No; Advanced Directives: No; Patient does not want information on Advanced Directives; Do not resuscitate: No; Living Will: No; Medical Power of Attorney: No Physician Affirmation I have reviewed and agree with the above information. Electronic Signature(s) Signed: 03/28/2017 3:03:07 PM By: Worthy Keeler PA-C Signed: 03/30/2017 4:15:18 PM By: Roger Shelter Entered By: Worthy Keeler on 03/28/2017 09:18:43 [name], [name] (  [id number]) -------------------------------------------------------------------------------- SuperBill Details Patient Name: [name], [name] Date of Service: 03/28/2017 Medical Record Number: [id number] Patient Account Number: [id number] Date of Birth/Sex: [birth year] (52 y.o. Female) Treating RN: Roger Shelter Primary Care Provider: Jonna Clark Other Clinician: Referring Provider: Jonna Clark Treating Provider/Extender: Melburn Hake, Geniene List Weeks in Treatment: 10 Diagnosis Coding ICD-10 Codes Code Description [phone] Non-pressure chronic ulcer of right calf with fat layer exposed I87.331 Chronic venous hypertension (idiopathic) with ulcer and inflammation of right lower extremity L03.115 Cellulitis of right lower limb Facility Procedures CPT4 Code: 76394320 Description: 03794 - DEB  SUBQ TISSUE 20 SQ CM/< ICD-10 Diagnosis Description L97.212 Non-pressure chronic ulcer of right calf with fat layer exp Modifier: osed Quantity: 1 Physician Procedures CPT4 Code: 4461901 Description: 22241 - WC PHYS SUBQ TISS 20 SQ CM ICD-10 Diagnosis Description L97.212 Non-pressure chronic ulcer of right calf with fat layer exp Modifier: osed Quantity: 1 Electronic Signature(s) Signed: 03/28/2017 3:03:07 PM By: Worthy Keeler PA-C Entered By: Worthy Keeler on 03/28/2017 09:20:26

## 2017-03-31 NOTE — Telephone Encounter (Signed)
Patient did not show for her [facility] appointment on 03/31/17. Will attempt to reschedule.

## 2017-03-31 NOTE — Progress Notes (Signed)
[name], [name] ([id number]) Visit Report for 03/28/2017 Arrival Information Details Patient Name: [name] Date of Service: 03/28/2017 9:30 AM Medical Record Number: [id number] Patient Account Number: [id number] Date of Birth/Sex: April 10, 1965 (52 y.o. Female) Treating RN: Roger Shelter Primary Care Edgar Reisz: Jonna Clark Other Clinician: Referring Alianys Chacko: Jonna Clark Treating Kyomi Hector/Extender: Melburn Hake, HOYT Weeks in Treatment: 10 Visit Information History Since Last Visit All ordered tests and consults were completed: No Patient Arrived: Ambulatory Added or deleted any medications: No Arrival Time: 08:54 Any new allergies or adverse reactions: No Accompanied By: self Had a fall or experienced change in No Transfer Assistance: None activities of daily living that may affect Patient Identification Verified: Yes risk of falls: Secondary Verification Process Completed: Yes Signs or symptoms of abuse/neglect since last visito No Patient Requires Transmission-Based No Hospitalized since last visit: No Precautions: Pain Present Now: Yes Patient Has Alerts: No Electronic Signature(s) Signed: 03/30/2017 4:15:18 PM By: Roger Shelter Entered By: Roger Shelter on 03/28/2017 08:54:48 [name] ([id number]) -------------------------------------------------------------------------------- Encounter Discharge Information Details Patient Name: [name] Date of Service: 03/28/2017 9:30 AM Medical Record Number: [id number] Patient Account Number: [id number] Date of Birth/Sex: August 22, 1965 (52 y.o. Female) Treating RN: Roger Shelter Primary Care Ceciley Buist: Jonna Clark Other Clinician: Referring Riel Hirschman: Jonna Clark Treating Jayquan Bradsher/Extender: Melburn Hake, HOYT Weeks in Treatment: 10 Encounter Discharge Information Items Discharge Pain Level: 0 Discharge Condition: Stable Ambulatory Status: Ambulatory Discharge Destination: Home Transportation: Private  Auto Accompanied By: self Schedule Follow-up Appointment: Yes Medication Reconciliation completed and Yes provided to Patient/Care Elzie Sheets: Provided on Clinical Summary of Care: 03/28/2017 Form Type Recipient Paper Patient DW Electronic Signature(s) Signed: 03/28/2017 10:40:22 AM By: Gretta Cool, BSN, RN, CWS, Kim RN, BSN Entered By: Gretta Cool, BSN, RN, CWS, Kim on 03/28/2017 09:26:42 [name] ([id number]) -------------------------------------------------------------------------------- Lower Extremity Assessment Details Patient Name: [name] Date of Service: 03/28/2017 9:30 AM Medical Record Number: [id number] Patient Account Number: [id number] Date of Birth/Sex: November 05, 1965 (52 y.o. Female) Treating RN: Roger Shelter Primary Care Samhitha Rosen: Jonna Clark Other Clinician: Referring Naresh Althaus: Jonna Clark Treating Tawnee Clegg/Extender: Melburn Hake, HOYT Weeks in Treatment: 10 Edema Assessment Assessed: [Left: No] [Right: No] Edema: [Left: Ye] [Right: s] Calf Left: Right: Point of Measurement: 39 cm From Medial Instep cm 41.5 cm Ankle Left: Right: Point of Measurement: 11 cm From Medial Instep cm 24.5 cm Vascular Assessment Claudication: Claudication Assessment [Right:None] Pulses: Dorsalis Pedis Palpable: [Right:Yes] Posterior Tibial Extremity colors, hair growth, and conditions: Extremity Color: [Right:Normal] Hair Growth on Extremity: [Right:Yes] Temperature of Extremity: [Right:Warm] Capillary Refill: [Right:< 3 seconds] Toe Nail Assessment Left: Right: Thick: No Discolored: No Deformed: No Improper Length and Hygiene: No Electronic Signature(s) Signed: 03/30/2017 4:15:18 PM By: Roger Shelter Entered By: Roger Shelter on 03/28/2017 09:04:21 [name] ([id number]) -------------------------------------------------------------------------------- Multi Wound Chart Details Patient Name: [name] Date of Service: 03/28/2017 9:30 AM Medical Record Number:  [id number] Patient Account Number: [id number] Date of Birth/Sex: [birth year] (52 y.o. Female) Treating RN: Roger Shelter Primary Care Delwin Raczkowski: Jonna Clark Other Clinician: Referring Akash Winski: Jonna Clark Treating Felis Quillin/Extender: STONE III, HOYT Weeks in Treatment: 10 Vital Signs Height(in): 70 Pulse(bpm): 28 Weight(lbs): 225 Blood Pressure(mmHg): 160/110 Body Mass Index(BMI): 32 Temperature(F): 98.1 Respiratory Rate 18 (breaths/min): Photos: [1:No Photos] [N/A:N/A] Wound Location: [1:Right Lower Leg - Anterior] [N/A:N/A] Wounding Event: [1:Trauma] [N/A:N/A] Primary Etiology: [1:Trauma, Other] [N/A:N/A] Comorbid History: [1:Chronic Obstructive Pulmonary Disease (COPD), Sleep Apnea, Arrhythmia, Congestive Heart Failure, Deep Vein Thrombosis, Hypertension, Gout, Rheumatoid Arthritis, Osteoarthritis, Confinement Anxiety] [N/A:N/A] Date Acquired: [1:12/09/2016] [N/A:N/A] Weeks of Treatment: [1:10] [N/A:N/A] Wound Status: [1:Open] [  N/A:N/A] Measurements L x W x D [1:2.5x2.5x0.2] [N/A:N/A] (cm) Area (cm) : [1:4.909] [N/A:N/A] Volume (cm) : [1:0.982] [N/A:N/A] % Reduction in Area: [1:-129.80%] [N/A:N/A] % Reduction in Volume: [1:-53.20%] [N/A:N/A] Classification: [1:Full Thickness Without Exposed Support Structures] [N/A:N/A] Exudate Amount: [1:Large] [N/A:N/A] Exudate Type: [1:Serous] [N/A:N/A] Exudate Color: [1:amber] [N/A:N/A] Wound Margin: [1:Flat and Intact] [N/A:N/A] Granulation Amount: [1:None Present (0%)] [N/A:N/A] Necrotic Amount: [1:Large (67-100%)] [N/A:N/A] Exposed Structures: [1:Fat Layer (Subcutaneous Tissue) Exposed: Yes Fascia: No Tendon: No Muscle: No Joint: No Bone: No] [N/A:N/A] Epithelialization: None N/A N/A Periwound Skin Texture: Excoriation: No N/A N/A Induration: No Callus: No Crepitus: No Rash: No Scarring: No Periwound Skin Moisture: Maceration: Yes N/A N/A Dry/Scaly: No Periwound Skin Color: Hemosiderin Staining: Yes N/A  N/A Atrophie Blanche: No Cyanosis: No Ecchymosis: No Erythema: No Mottled: No Pallor: No Rubor: No Temperature: No Abnormality N/A N/A Tenderness on Palpation: Yes N/A N/A Wound Preparation: Ulcer Cleansing: N/A N/A Rinsed/Irrigated with Saline Topical Anesthetic Applied: Other: lidocaine 4% Treatment Notes Electronic Signature(s) Signed: 03/30/2017 4:15:18 PM By: Roger Shelter Entered By: Roger Shelter on 03/28/2017 09:10:43 Leachman, Neoma Randall ([id number]) -------------------------------------------------------------------------------- [place] Details Patient Name: [name] Date of Service: 03/28/2017 9:30 AM Medical Record Number: [id number] Patient Account Number: [id number] Date of Birth/Sex: 17-Mar-1965 (52 y.o. Female) Treating RN: Roger Shelter Primary Care Kezia Benevides: Jonna Clark Other Clinician: Referring Damone Fancher: Jonna Clark Treating Sharla Tankard/Extender: Melburn Hake, HOYT Weeks in Treatment: 10 Active Inactive ` Orientation to the Wound Care Program Nursing Diagnoses: Knowledge deficit related to the wound healing center program Goals: Patient/caregiver will verbalize understanding of the [place] Program Date Initiated: 01/12/2017 Target Resolution Date: 02/13/2016 Goal Status: Active Interventions: Provide education on orientation to the wound center Notes: ` Wound/Skin Impairment Nursing Diagnoses: Impaired tissue integrity Knowledge deficit related to ulceration/compromised skin integrity Goals: Patient/caregiver will verbalize understanding of skin care regimen Date Initiated: 01/12/2017 Target Resolution Date: 02/12/2017 Goal Status: Active Ulcer/skin breakdown will have a volume reduction of 30% by week 4 Date Initiated: 01/12/2017 Target Resolution Date: 02/12/2017 Goal Status: Active Interventions: Assess patient/caregiver ability to obtain necessary supplies Assess ulceration(s) every visit Provide  education on ulcer and skin care Treatment Activities: Skin care regimen initiated : 01/12/2017 Notes: Electronic Signature(s) Signed: 03/30/2017 4:15:18 PM By: Viona Gilmore, Neoma Randall ([id number]) Entered By: Roger Shelter on 03/28/2017 09:10:33 [place], Neoma Randall ([id number]) -------------------------------------------------------------------------------- Pain Assessment Details Patient Name: [name] Date of Service: 03/28/2017 9:30 AM Medical Record Number: [id number] Patient Account Number: [id number] Date of Birth/Sex: Mar 02, 1965 (52 y.o. Female) Treating RN: Roger Shelter Primary Care Quest Tavenner: Jonna Clark Other Clinician: Referring Yareli Carthen: Jonna Clark Treating Ferdie Bakken/Extender: STONE III, HOYT Weeks in Treatment: 10 Active Problems Location of Pain Severity and Description of Pain Patient Has Paino Yes Site Locations Pain Location: Pain in Ulcers Duration of the Pain. Constant / Intermittento Intermittent Character of Pain Describe the Pain: Other: stinging Pain Management and Medication Current Pain Management: Electronic Signature(s) Signed: 03/30/2017 4:15:18 PM By: Roger Shelter Entered By: Roger Shelter on 03/28/2017 08:55:10 [name] ([id number]) -------------------------------------------------------------------------------- Patient/Caregiver Education Details Patient Name: [name] Date of Service: 03/28/2017 9:30 AM Medical Record Number: [id number] Patient Account Number: [id number] Date of Birth/Gender: [birth year] (52 y.o. Female) Treating RN: Cornell Barman Primary Care Physician: Jonna Clark Other Clinician: Referring Physician: Jonna Clark Treating Physician/Extender: Sharalyn Ink in Treatment: 10 Education Assessment Education Provided To: Patient Education Topics Provided Wound/Skin Impairment: Handouts: Caring for Your Ulcer, Other: dressing changes as prescribed Methods: Demonstration,  Explain/Verbal Responses: State content correctly Electronic  Signature(s) Signed: 03/28/2017 10:40:22 AM By: Gretta Cool, BSN, RN, CWS, Kim RN, BSN Entered By: Gretta Cool, BSN, RN, CWS, Kim on 03/28/2017 09:27:00 [place], Neoma Randall ([id number]) -------------------------------------------------------------------------------- Wound Assessment Details Patient Name: [name] Date of Service: 03/28/2017 9:30 AM Medical Record Number: [id number] Patient Account Number: [id number] Date of Birth/Sex: [birth year] (52 y.o. Female) Treating RN: Roger Shelter Primary Care Doug Bucklin: Jonna Clark Other Clinician: Referring Royal Beirne: Jonna Clark Treating Cherrell Maybee/Extender: STONE III, HOYT Weeks in Treatment: 10 Wound Status Wound Number: 1 Primary Trauma, Other Etiology: Wound Location: Right Lower Leg - Anterior Wound Open Wounding Event: Trauma Status: Date Acquired: 12/09/2016 Comorbid Chronic Obstructive Pulmonary Disease (COPD), Weeks Of Treatment: 10 History: Sleep Apnea, Arrhythmia, Congestive Heart Clustered Wound: No Failure, Deep Vein Thrombosis, Hypertension, Gout, Rheumatoid Arthritis, Osteoarthritis, Confinement Anxiety Photos Photo Uploaded By: Roger Shelter on 03/28/2017 11:28:12 Wound Measurements Length: (cm) 2.5 Width: (cm) 2.5 Depth: (cm) 0.2 Area: (cm) 4.909 Volume: (cm) 0.982 % Reduction in Area: -129.8% % Reduction in Volume: -53.2% Epithelialization: None Tunneling: No Undermining: No Wound Description Full Thickness Without Exposed Support Classification: Structures Wound Margin: Flat and Intact Exudate Large Amount: Exudate Type: Serous Exudate Color: amber Foul Odor After Cleansing: No Slough/Fibrino Yes Wound Bed Granulation Amount: None Present (0%) Exposed Structure Necrotic Amount: Large (67-100%) Fascia Exposed: No Necrotic Quality: Adherent Slough Fat Layer (Subcutaneous Tissue) Exposed: Yes Tendon Exposed: No Muscle Exposed: No Scherman,  [place] ([id number]) Joint Exposed: No Bone Exposed: No Periwound Skin Texture Texture Color No Abnormalities Noted: No No Abnormalities Noted: No Callus: No Atrophie Blanche: No Crepitus: No Cyanosis: No Excoriation: No Ecchymosis: No Induration: No Erythema: No Rash: No Hemosiderin Staining: Yes Scarring: No Mottled: No Pallor: No Moisture Rubor: No No Abnormalities Noted: No Dry / Scaly: No Temperature / Pain Maceration: Yes Temperature: No Abnormality Tenderness on Palpation: Yes Wound Preparation Ulcer Cleansing: Rinsed/Irrigated with Saline Topical Anesthetic Applied: Other: lidocaine 4%, Treatment Notes Wound #1 (Right, Anterior Lower Leg) 1. Cleansed with: Clean wound with Normal Saline 2. Anesthetic Topical Lidocaine 4% cream to wound bed prior to debridement Hurricaine Topical Anesthetic Spray 4. Dressing Applied: Iodoflex 5. Secondary Dressing Applied Bordered Foam Dressing Electronic Signature(s) Signed: 03/30/2017 4:15:18 PM By: Roger Shelter Entered By: Roger Shelter on 03/28/2017 09:01:01 [place], Neoma Randall ([id number]) -------------------------------------------------------------------------------- [place] Details Patient Name: [name] Date of Service: 03/28/2017 9:30 AM Medical Record Number: [id number] Patient Account Number: [id number] Date of Birth/Sex: 1965/05/07 (52 y.o. Female) Treating RN: Roger Shelter Primary Care Nyasia Baxley: Jonna Clark Other Clinician: Referring Francis Yardley: Jonna Clark Treating Braelen Sproule/Extender: STONE III, HOYT Weeks in Treatment: 10 Vital Signs Time Taken: 08:55 Temperature (F): 98.1 Height (in): 70 Pulse (bpm): 82 Weight (lbs): 225 Respiratory Rate (breaths/min): 18 Body Mass Index (BMI): 32.3 Blood Pressure (mmHg): 160/110 Reference Range: 80 - 120 mg / dl Electronic Signature(s) Signed: 03/30/2017 4:15:18 PM By: Roger Shelter Entered By: Roger Shelter on 03/28/2017 08:56:35

## 2017-04-04 ENCOUNTER — Encounter: Payer: Medicaid Other | Admitting: Physician Assistant

## 2017-04-04 DIAGNOSIS — I87331 Chronic venous hypertension (idiopathic) with ulcer and inflammation of right lower extremity: Secondary | ICD-10-CM | POA: Diagnosis not present

## 2017-04-07 ENCOUNTER — Encounter: Payer: Self-pay | Admitting: Family Medicine

## 2017-04-07 ENCOUNTER — Ambulatory Visit: Payer: Medicaid Other | Admitting: Family Medicine

## 2017-04-07 VITALS — BP 159/125 | HR 82 | Temp 98.1°F | Ht 66.5 in | Wt 232.2 lb

## 2017-04-07 DIAGNOSIS — E876 Hypokalemia: Secondary | ICD-10-CM | POA: Diagnosis not present

## 2017-04-07 DIAGNOSIS — I5032 Chronic diastolic (congestive) heart failure: Secondary | ICD-10-CM | POA: Diagnosis not present

## 2017-04-07 DIAGNOSIS — J449 Chronic obstructive pulmonary disease, unspecified: Secondary | ICD-10-CM | POA: Diagnosis not present

## 2017-04-07 DIAGNOSIS — Z8639 Personal history of other endocrine, nutritional and metabolic disease: Secondary | ICD-10-CM

## 2017-04-07 DIAGNOSIS — E785 Hyperlipidemia, unspecified: Secondary | ICD-10-CM | POA: Diagnosis not present

## 2017-04-07 DIAGNOSIS — I10 Essential (primary) hypertension: Secondary | ICD-10-CM

## 2017-04-07 DIAGNOSIS — Z86711 Personal history of pulmonary embolism: Secondary | ICD-10-CM | POA: Diagnosis not present

## 2017-04-07 DIAGNOSIS — I4891 Unspecified atrial fibrillation: Secondary | ICD-10-CM | POA: Diagnosis not present

## 2017-04-07 DIAGNOSIS — G4730 Sleep apnea, unspecified: Secondary | ICD-10-CM

## 2017-04-07 MED ORDER — CLONIDINE HCL 0.1 MG PO TABS: 0.1000 mg | ORAL_TABLET | Freq: Two times a day (BID) | ORAL | 1 refills | Status: DC

## 2017-04-07 MED ORDER — SPIRONOLACTONE 25 MG PO TABS: 50.0000 mg | ORAL_TABLET | Freq: Every day | ORAL | 1 refills | Status: DC

## 2017-04-07 MED ORDER — SERTRALINE HCL 100 MG PO TABS: 100.0000 mg | ORAL_TABLET | Freq: Every day | ORAL | 1 refills | Status: DC

## 2017-04-07 MED ORDER — HYDRALAZINE HCL 50 MG PO TABS: 50.0000 mg | ORAL_TABLET | Freq: Three times a day (TID) | ORAL | 1 refills | Status: DC

## 2017-04-07 MED ORDER — METOPROLOL SUCCINATE ER 100 MG PO TB24: 100.0000 mg | ORAL_TABLET | Freq: Every day | ORAL | 1 refills | Status: DC

## 2017-04-07 MED ORDER — AMLODIPINE BESYLATE 10 MG PO TABS: 10.0000 mg | ORAL_TABLET | Freq: Every day | ORAL | 1 refills | Status: DC

## 2017-04-07 MED ORDER — ATORVASTATIN CALCIUM 20 MG PO TABS: 20.0000 mg | ORAL_TABLET | Freq: Every day | ORAL | 1 refills | Status: DC

## 2017-04-07 NOTE — Progress Notes (Signed)
BP (!) 159/125   Pulse 82   Temp 98.1 F (36.7 C) (Oral)   Ht 5' 6.5" (1.689 m)   Wt 232 lb 3.2 oz (105.3 kg)   SpO2 99%   BMI 36.92 kg/m    Subjective:    Patient ID: [name], female    DOB: 11/05/1965, 52 y.o.   MRN: [id number]  HPI: [name] is a 52 y.o. female  Chief Complaint  Patient presents with  . Establish Care   Pt here today to establish care. Has been out of her medications for quite some time, was restarted on lasix, lisinopril and potassium a week or two ago at her CHF appt. Was due for a recheck BMP for hypokalemia last week but missed her appt. Weights stable at home between 224-232 (232 is her dry weight per pt). Denies significant orthopnea, LE edema, DOE.   COPD controlled without inhalers   Sleeps with CPAP nightly for her OSA. States it helps quite a bit. No issues there.   Hx of thyroid disease, had some sort of treatment (she believes radioactive iodine) in 2002. Has had normal readings since this treatment.   Hx of PE years ago (pt unsure of when), was anticoagulated for 6 months and then was able to come off. Also has hx of CVA, unclear of timeline there as well. No issues since. Does have a fib with good rate control.   Significantly uncontrolled HTN, was on a significant BP medication regimen prior to running out of her medicines several months ago. Sometimes having HAs, denies CP, visual disturbances. States overall she feels much better when her BPs are under better control.   Past Medical History:  Diagnosis Date  . A-fib ([place]) 2010  . Adrenal adenoma, left   . Alcohol abuse   . Allergy   . CHF (congestive heart failure) ([place])   . COPD (chronic obstructive pulmonary disease) ([place])   . Fatty liver   . GERD (gastroesophageal reflux disease)   . Gout   . Hypertension   . Mild cognitive impairment   . Non-healing non-surgical wound    right leg  . Obesity   . Pulmonary embolus ([place])   . Sleep apnea   . Stroke ([place])   . Thyroid  disease    Social History   Socioeconomic History  . Marital status: Legally Separated    Spouse name: Not on file  . Number of children: Not on file  . Years of education: Not on file  . Highest education level: Not on file  Social Needs  . Financial resource strain: Not on file  . Food insecurity - worry: Not on file  . Food insecurity - inability: Not on file  . Transportation needs - medical: Not on file  . Transportation needs - non-medical: Not on file  Occupational History  . Not on file  Tobacco Use  . Smoking status: Former Smoker    Types: Cigarettes    Last attempt to quit: 01/26/2016    Years since quitting: 1.2  . Smokeless tobacco: Never Used  Substance and Sexual Activity  . Alcohol use: Yes    Comment: occ  . Drug use: No  . Sexual activity: No  Other Topics Concern  . Not on file  Social History Narrative  . Not on file    Relevant past medical, surgical, family and social history reviewed and updated as indicated. Interim medical history since our last visit reviewed. Allergies and medications reviewed  and updated.  Review of Systems  Per HPI unless specifically indicated above     Objective:    BP (!) 159/125   Pulse 82   Temp 98.1 F (36.7 C) (Oral)   Ht 5' 6.5" (1.689 m)   Wt 232 lb 3.2 oz (105.3 kg)   SpO2 99%   BMI 36.92 kg/m   Wt Readings from Last 3 Encounters:  04/07/17 232 lb 3.2 oz (105.3 kg)  03/24/17 232 lb (105.2 kg)  02/19/17 225 lb (102.1 kg)    Physical Exam  Constitutional: She is oriented to person, place, and time. She appears well-developed and well-nourished. No distress.  HENT:  Head: Atraumatic.  Eyes: Conjunctivae are normal. Pupils are equal, round, and reactive to light.  Neck: Normal range of motion. Neck supple.  Cardiovascular: Normal rate and normal heart sounds.  Pulmonary/Chest: Effort normal and breath sounds normal. No respiratory distress.  Musculoskeletal: Normal range of motion.  Neurological: She  is alert and oriented to person, place, and time.  Skin: Skin is warm and dry. No rash noted.  Psychiatric: She has a normal mood and affect. Her behavior is normal.  Nursing note and vitals reviewed.   Results for orders placed or performed in visit on 22/02/54  Basic Metabolic Panel (BMET)  Result Value Ref Range   Glucose 83 65 - 99 mg/dL   BUN 15 6 - 24 mg/dL   Creatinine, Ser 0.83 0.57 - 1.00 mg/dL   GFR calc non Af Amer 82 >59 mL/min/1.73   GFR calc Af Amer 94 >59 mL/min/1.73   BUN/Creatinine Ratio 18 9 - 23   Sodium 149 (H) 134 - 144 mmol/L   Potassium 4.4 3.5 - 5.2 mmol/L   Chloride 108 (H) 96 - 106 mmol/L   CO2 25 20 - 29 mmol/L   Calcium 9.2 8.7 - 10.2 mg/dL      Assessment & Plan:   Problem List Items Addressed This Visit      Cardiovascular and Mediastinum   Essential hypertension (Chronic)    Will restart entire BP regimen today as still significantly elevated just back on several agents. Recheck BMP today.       Relevant Medications   amLODipine (NORVASC) 10 MG tablet   atorvastatin (LIPITOR) 20 MG tablet   cloNIDine (CATAPRES) 0.1 MG tablet   hydrALAZINE (APRESOLINE) 50 MG tablet   metoprolol succinate (TOPROL-XL) 100 MG 24 hr tablet   spironolactone (ALDACTONE) 25 MG tablet   Chronic diastolic heart failure (HCC) (Chronic)    Followed by HF Clinic, pt no-showed appt last week so will recheck BMP today as requested by HF clinic but recommended she call and schedule back with them ASAP for f/u.       Relevant Medications   amLODipine (NORVASC) 10 MG tablet   atorvastatin (LIPITOR) 20 MG tablet   cloNIDine (CATAPRES) 0.1 MG tablet   hydrALAZINE (APRESOLINE) 50 MG tablet   metoprolol succinate (TOPROL-XL) 100 MG 24 hr tablet   spironolactone (ALDACTONE) 25 MG tablet   A-fib (HCC)    Rate well controlled      Relevant Medications   amLODipine (NORVASC) 10 MG tablet   atorvastatin (LIPITOR) 20 MG tablet   cloNIDine (CATAPRES) 0.1 MG tablet    hydrALAZINE (APRESOLINE) 50 MG tablet   metoprolol succinate (TOPROL-XL) 100 MG 24 hr tablet   spironolactone (ALDACTONE) 25 MG tablet     Respiratory   COPD (chronic obstructive pulmonary disease) (HCC)    Stable and under  good control w/o inhalers      Sleep apnea    Stable on CPAP nightly        Other   Hypokalemia - Primary    Recheck BMP today. WIll adjust potassium if needed. Restarting spironolactone today so will need to recheck again at 1 month f/u.       Relevant Orders   Basic Metabolic Panel (BMET) (Completed)   Hyperlipidemia    No issues with atorvastatin. Will recheck lipids at CPE      Relevant Medications   amLODipine (NORVASC) 10 MG tablet   atorvastatin (LIPITOR) 20 MG tablet   cloNIDine (CATAPRES) 0.1 MG tablet   hydrALAZINE (APRESOLINE) 50 MG tablet   metoprolol succinate (TOPROL-XL) 100 MG 24 hr tablet   spironolactone (ALDACTONE) 25 MG tablet   History of pulmonary embolism    S/p 6 months of anticoagulation, stable with no new sxs since      History of thyroid disease    Unknown type, will check TSH at CPE and get records from prior providers in meantime          Follow up plan: Return in about 4 weeks (around 05/05/2017) for CPE.

## 2017-04-07 NOTE — Progress Notes (Signed)
[name], [name] ([id number]) Visit Report for 04/04/2017 Chief Complaint Document Details Patient Name: [name] Date of Service: 04/04/2017 10:00 AM Medical Record Number: [id number] Patient Account Number: [id number] Date of Birth/Sex: 1965/05/11 (52 y.o. Female) Treating RN: Roger Shelter Primary Care Provider: Jonna Clark Other Clinician: Referring Provider: Jonna Clark Treating Provider/Extender: Melburn Hake, HOYT Weeks in Treatment: 11 Information Obtained from: Patient Chief Complaint Right LE Ulcer Electronic Signature(s) Signed: 04/05/2017 8:16:06 AM By: Worthy Keeler PA-C Entered By: Worthy Keeler on 04/04/2017 09:26:04 [name] ([id number]) -------------------------------------------------------------------------------- HPI Details Patient Name: [name] Date of Service: 04/04/2017 10:00 AM Medical Record Number: [id number] Patient Account Number: [id number] Date of Birth/Sex: [birth year] (52 y.o. Female) Treating RN: Roger Shelter Primary Care Provider: Jonna Clark Other Clinician: Referring Provider: Jonna Clark Treating Provider/Extender: Melburn Hake, HOYT Weeks in Treatment: 11 History of Present Illness HPI Description: 01/12/17; this is a 52 year old nondiabetic remote ex-smoker. She tells me that sometime in early November she developed a sudden painful area on her right medial lower leg while she was in bed at night she reached down and scratch the area and woke up in the morning with a scab small wound. This gradually got worse over time. She was seen in the ER on 12/18/16. At that point the possibility of an insect bite/brown recluse spider bite was brought up. At that point it was 1 cm eschar covered wound. She was treated with Bactrim DS. She was back in the ER on 01/09/17 with increasing size pain of the wound. At this point the patient states it was covered with a black eschar at surface. An x-ray showed punctate radio density material  within the wound but no osteomyelitis. 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 She was put on clindamycin. The patient was seen on 12/7 by vascular surgery. At that point noting a 3 x 3 cm necrotic wound. ABIs were ordered. She was treated with Silvadene cream. I don't see the results of the ABIs at this point. She is using Silvadene cream. A comprehensive metabolic panel CBC and differential PT and PTT were done which were also normal. 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 COPD/asthma/depression and a history of atrial fib. She tells me she had a history of vein stripping many years ago ABIs in our clinic were noncompressible on the right at 1.59 01/19/17; right medial lower leg wound. Small wound with some depth. Still requiring debridement we are using Santyl. She has vascular studies on January 9 but at this point I can't tell whether these are arterial venous or both. They are at [facility] 01/27/16; right medial lower leg wound. She has arterial and venous studies later this month. The wound looks somewhat better after debridement. We've been using Santyl. 02/08/17 on evaluation today patient has had for arterial studies performed. This showed noncompressible measurements in regard to the ABI although she did have tried phasic flow and it stated in the report that she did have good TBI's. With that being said I see no documentation of TBI's and I cannot find the final report in epic only see what the patient brought in to show me today. We obviously are going to need to contact the office to see about getting the final report. We have been using Santyl and this does seem to be helping the wound to a degree. Patient is having no significant pain. 03/21/17 on evaluation today patient appears to be a little bit  larger in regard to her ulcer. It has actually been almost a month since we have last seen her. She states that she has  continued to use the [place] during that time. With that being said she is noting that she does have "pus" noted on the surface of the wound. Upon further inspection it actually appears that this is more slough and not pass noted at this point. There is no erythema surrounding the wound and nothing that would suggest that she is having an infection at this point. Her blood pressure is significantly high at this time. She states that she is just about out of blood pressure medication obviously we did tell her we cannot prescribed this needs to see her primary care provider as soon as possible. 04/04/17 on evaluation today patient states that over the past week she's been having a lot of significant burning with her dressings. We did switch her to the Iodoflex recently and I believe this may be the reason that she's been experiencing the burning type sensations. Nonetheless she really did not tell me about this as much last week and even over the past week she did not call list in order to tell us that she was having any issues. Nonetheless the wound does appear to be better. Electronic Signature(s) Signed: 04/05/2017 8:16:06 AM By: Worthy Keeler PA-C Entered By: Worthy Keeler on 04/04/2017 16:25:42 [place], [place] ([id number], [name] ([id number]) -------------------------------------------------------------------------------- Physical Exam Details Patient Name: [name] Date of Service: 04/04/2017 10:00 AM Medical Record Number: [id number] Patient Account Number: [id number] Date of Birth/Sex: [birth year] (52 y.o. Female) Treating RN: Roger Shelter Primary Care Provider: Jonna Clark Other Clinician: Referring Provider: Jonna Clark Treating Provider/Extender: STONE III, HOYT Weeks in Treatment: 55 Constitutional Well-nourished and well-hydrated in no acute distress. Respiratory normal breathing without difficulty. clear to auscultation bilaterally. Cardiovascular regular  rate and rhythm with normal S1, S2. 1+ pitting edema of the bilateral lower extremities. Psychiatric this patient is able to make decisions and demonstrates good insight into disease process. Alert and Oriented x 3. pleasant and cooperative. Notes Currently patient's wound shows fairly good granulation. There does appear to be some evidence of slough covering but due to the fact she's been having a lot of increased pain no sharp debridement was performed today. Electronic Signature(s) Signed: 04/05/2017 8:16:06 AM By: Worthy Keeler PA-C Entered By: Worthy Keeler on 04/04/2017 16:26:40 [place], [name] ([id number]) -------------------------------------------------------------------------------- Physician Orders Details Patient Name: [name] Date of Service: 04/04/2017 10:00 AM Medical Record Number: [id number] Patient Account Number: [id number] Date of Birth/Sex: 15-Oct-1965 (52 y.o. Female) Treating RN: Roger Shelter Primary Care Provider: Jonna Clark Other Clinician: Referring Provider: Jonna Clark Treating Provider/Extender: Melburn Hake, HOYT Weeks in Treatment: 11 Verbal / Phone Orders: No Diagnosis Coding ICD-10 Coding Code Description [phone] Non-pressure chronic ulcer of right calf with fat layer exposed I87.331 Chronic venous hypertension (idiopathic) with ulcer and inflammation of right lower extremity L03.115 Cellulitis of right lower limb Wound Cleansing Wound #1 Right,Anterior Lower Leg o Clean wound with Normal Saline. Anesthetic (add to Medication List) Wound #1 Right,Anterior Lower Leg o Topical Lidocaine 4% cream applied to wound bed prior to debridement (In Clinic Only). o Hurricaine Topical Anesthetic Spray applied to wound bed prior to debridement (In Clinic Only). Primary Wound Dressing Wound #1 Right,Anterior Lower Leg o Hydrafera Blue Secondary Dressing Wound #1 Right,Anterior Lower Leg o Boardered Foam Dressing Dressing Change  Frequency Wound #1 Right,Anterior Lower Leg o Change dressing every other day.  Follow-up Appointments o Return Appointment in 1 week. Additional Orders / Instructions o Vitamin A; Vitamin C, Zinc o Increase protein intake. Electronic Signature(s) Signed: 04/05/2017 8:16:06 AM By: Worthy Keeler PA-C Signed: 04/06/2017 8:28:46 AM By: Roger Shelter Entered By: Roger Shelter on 04/04/2017 11:06:00 [place], [name] ([id number]) -------------------------------------------------------------------------------- Problem List Details Patient Name: [name] Date of Service: 04/04/2017 10:00 AM Medical Record Number: [id number] Patient Account Number: [id number] Date of Birth/Sex: 06-11-1965 (52 y.o. Female) Treating RN: Roger Shelter Primary Care Provider: Jonna Clark Other Clinician: Referring Provider: Jonna Clark Treating Provider/Extender: Melburn Hake, HOYT Weeks in Treatment: 11 Active Problems ICD-10 Encounter Code Description Active Date Diagnosis L97.212 Non-pressure chronic ulcer of right calf with fat layer exposed 01/12/2017 Yes I87.331 Chronic venous hypertension (idiopathic) with ulcer and 01/12/2017 Yes inflammation of right lower extremity L03.115 Cellulitis of right lower limb 01/12/2017 Yes Inactive Problems Resolved Problems Electronic Signature(s) Signed: 04/05/2017 8:16:06 AM By: Worthy Keeler PA-C Entered By: Worthy Keeler on 04/04/2017 09:25:57 [name], [name] ([id number]) -------------------------------------------------------------------------------- Progress Note Details Patient Name: [name] Date of Service: 04/04/2017 10:00 AM Medical Record Number: [id number] Patient Account Number: [id number] Date of Birth/Sex: [birth year] (52 y.o. Female) Treating RN: Roger Shelter Primary Care Provider: Jonna Clark Other Clinician: Referring Provider: Jonna Clark Treating Provider/Extender: Melburn Hake, HOYT Weeks in Treatment:  11 Subjective Chief Complaint Information obtained from Patient Right LE Ulcer History of Present Illness (HPI) 01/12/17; this is a 52 year old nondiabetic remote ex-smoker. She tells me that sometime in early November she developed a sudden painful area on her right medial lower leg while she was in bed at night she reached down and scratch the area and woke up in the morning with a scab small wound. This gradually got worse over time. She was seen in the ER on 12/18/16. At that point the possibility of an insect bite/brown recluse spider bite was brought up. At that point it was 1 cm eschar covered wound. She was treated with Bactrim DS. She was back in the ER on 01/09/17 with increasing size pain of the wound. At this point the patient states it was covered with a black eschar at surface. An x-ray showed punctate radio density material within the wound but no osteomyelitis. 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 She was put on clindamycin. The patient was seen on 12/7 by vascular surgery. At that point noting a 3 x 3 cm necrotic wound. ABIs were ordered. She was treated with Silvadene cream. I don't see the results of the ABIs at this point. She is using Silvadene cream. A comprehensive metabolic panel CBC and differential PT and PTT were done which were also normal. 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 COPD/asthma/depression and a history of atrial fib. She tells me she had a history of vein stripping many years ago ABIs in our clinic were noncompressible on the right at 1.59 01/19/17; right medial lower leg wound. Small wound with some depth. Still requiring debridement we are using Santyl. She has vascular studies on January 9 but at this point I can't tell whether these are arterial venous or both. They are at [facility] 01/27/16; right medial lower leg wound. She has arterial and venous studies later this month. The wound  looks somewhat better after debridement. We've been using Santyl. 02/08/17 on evaluation today patient has had for arterial studies performed. This showed noncompressible measurements in regard to the ABI although she did have tried phasic  flow and it stated in the report that she did have good TBI's. With that being said I see no documentation of TBI's and I cannot find the final report in epic only see what the patient brought in to show me today. We obviously are going to need to contact the office to see about getting the final report. We have been using Santyl and this does seem to be helping the wound to a degree. Patient is having no significant pain. 03/21/17 on evaluation today patient appears to be a little bit larger in regard to her ulcer. It has actually been almost a month since we have last seen her. She states that she has continued to use the [place] during that time. With that being said she is noting that she does have "pus" noted on the surface of the wound. Upon further inspection it actually appears that this is more slough and not pass noted at this point. There is no erythema surrounding the wound and nothing that would suggest that she is having an infection at this point. Her blood pressure is significantly high at this time. She states that she is just about out of blood pressure medication obviously we did tell her we cannot prescribed this needs to see her primary care provider as soon as possible. 04/04/17 on evaluation today patient states that over the past week she's been having a lot of significant burning with her dressings. We did switch her to the Iodoflex recently and I believe this may be the reason that she's been experiencing the burning type sensations. Nonetheless she really did not tell me about this as much last week and even over the past week she did not call list in order to tell us that she was having any issues. Nonetheless the wound does appear to be  better. [name], [name] ([id number]) Patient History Information obtained from Patient. Family History Cancer - Siblings, No family history of Diabetes, Heart Disease, Hypertension, Kidney Disease, Lung Disease, Seizures, Stroke, Thyroid Problems, Tuberculosis. Social History Former smoker - stopped 6 years ago, Marital Status - Separated, Alcohol Use - Rarely, Drug Use - No History, Caffeine Use - Moderate. Review of Systems (ROS) Constitutional Symptoms (General Health) Denies complaints or symptoms of Fever, Chills. Respiratory The patient has no complaints or symptoms. Cardiovascular The patient has no complaints or symptoms. Psychiatric The patient has no complaints or symptoms. Objective Constitutional Well-nourished and well-hydrated in no acute distress. Vitals Time Taken: 10:31 AM, Height: 70 in, Weight: 225 lbs, BMI: 32.3, Temperature: 98.3 F, Pulse: 82 bpm, Respiratory Rate: 18 breaths/min, Blood Pressure: 158/92 mmHg. Respiratory normal breathing without difficulty. clear to auscultation bilaterally. Cardiovascular regular rate and rhythm with normal S1, S2. 1+ pitting edema of the bilateral lower extremities. Psychiatric this patient is able to make decisions and demonstrates good insight into disease process. Alert and Oriented x 3. pleasant and cooperative. General Notes: Currently patient's wound shows fairly good granulation. There does appear to be some evidence of slough covering but due to the fact she's been having a lot of increased pain no sharp debridement was performed today. Integumentary (Hair, Skin) Wound #1 status is Open. Original cause of wound was Trauma. The wound is located on the Right,Anterior Lower Leg. The wound measures 2.4cm length x 2.4cm width x 0.2cm depth; 4.524cm^2 area and 0.905cm^3 volume. There is Fat Layer (Subcutaneous Tissue) Exposed exposed. There is no tunneling or undermining noted. There is a large amount of serous Iod[place],  Jamesyn ([id number]) drainage  noted. The wound margin is flat and intact. There is small (1-33%) pink granulation within the wound bed. There is a large (67-100%) amount of necrotic tissue within the wound bed including Adherent Slough. The periwound skin appearance exhibited: Maceration, Hemosiderin Staining. The periwound skin appearance did not exhibit: Callus, Crepitus, Excoriation, Induration, Rash, Scarring, Dry/Scaly, Atrophie Blanche, Cyanosis, Ecchymosis, Mottled, Pallor, Rubor, Erythema. Periwound temperature was noted as No Abnormality. The periwound has tenderness on palpation. Assessment Active Problems ICD-10 L97.212 - Non-pressure chronic ulcer of right calf with fat layer exposed I87.331 - Chronic venous hypertension (idiopathic) with ulcer and inflammation of right lower extremity L03.115 - Cellulitis of right lower limb Procedures Wound(s) were mechanically debrided using saline and gauze to remove devitalized tissue Plan Wound Cleansing: Wound #1 Right,Anterior Lower Leg: Clean wound with Normal Saline. Anesthetic (add to Medication List): Wound #1 Right,Anterior Lower Leg: Topical Lidocaine 4% cream applied to wound bed prior to debridement (In Clinic Only). Hurricaine Topical Anesthetic Spray applied to wound bed prior to debridement (In Clinic Only). Primary Wound Dressing: Wound #1 Right,Anterior Lower Leg: Hydrafera Blue Secondary Dressing: Wound #1 Right,Anterior Lower Leg: Boardered Foam Dressing Dressing Change Frequency: Wound #1 Right,Anterior Lower Leg: Change dressing every other day. Follow-up Appointments: Return Appointment in 1 week. Additional Orders / Instructions: Vitamin A; Vitamin C, Zinc Increase protein intake. [place], [name] ([id number]) I'm gonna recommend at this point in time that we continue to manage her wounds although I'm gonna switch away from the Iodoflex we will attempt [facility] Dressing hopefully not be harmful to her as  far as the burning is concerned. Patient is in agreement with plan. We will see were things stand in one weeks time. Please see above for specific wound care orders. We will see patient for re-evaluation in 1 week(s) here in the clinic. If anything worsens or changes patient will contact our office for additional recommendations. Electronic Signature(s) Signed: 04/05/2017 8:16:06 AM By: Worthy Keeler PA-C Entered By: Worthy Keeler on 04/04/2017 16:27:06 [place], [name] ([id number]) -------------------------------------------------------------------------------- ROS/PFSH Details Patient Name: [name] Date of Service: 04/04/2017 10:00 AM Medical Record Number: [id number] Patient Account Number: [id number] Date of Birth/Sex: [birth year] (52 y.o. Female) Treating RN: Roger Shelter Primary Care Provider: Jonna Clark Other Clinician: Referring Provider: Jonna Clark Treating Provider/Extender: Melburn Hake, HOYT Weeks in Treatment: 11 Information Obtained From Patient Wound History Do you currently have one or more open woundso Yes How many open wounds do you currently haveo 1 Approximately how long have you had your woundso one month How have you been treating your wound(s) until nowo 3 weeks Has your wound(s) ever healed and then re-openedo Yes Have you had any lab work done in the past montho Yes Have you tested positive for an antibiotic resistant organism (MRSA, VRE)o No Have you tested positive for osteomyelitis (bone infection)o No Have you had any tests for circulation on your legso Yes Where was the test doneo [facility] Have you had other problems associated with your woundso Infection Constitutional Symptoms (General Health) Complaints and Symptoms: Negative for: Fever; Chills Eyes Medical History: Negative for: Cataracts; Glaucoma; Optic Neuritis Ear/Nose/Mouth/Throat Medical History: Negative for: Chronic sinus problems/congestion; Middle ear  problems Hematologic/Lymphatic Medical History: Negative for: Anemia; Hemophilia; Human Immunodeficiency Virus; Lymphedema; Sickle Cell Disease Respiratory Complaints and Symptoms: No Complaints or Symptoms Medical History: Positive for: Chronic Obstructive Pulmonary Disease (COPD); Sleep Apnea Negative for: Aspiration; Asthma; Pneumothorax; Tuberculosis Cardiovascular Complaints and Symptoms: No Complaints or Symptoms [name], [name] ([id number]) Medical History:  Positive for: Arrhythmia; Congestive Heart Failure; Deep Vein Thrombosis - in lower legs and history of varicose veins; Hypertension Negative for: Angina; Coronary Artery Disease; Hypotension; Myocardial Infarction; Peripheral Arterial Disease; Peripheral Venous Disease; Phlebitis; Vasculitis Gastrointestinal Medical History: Negative for: Cirrhosis ; Colitis; Crohnos; Hepatitis A; Hepatitis B; Hepatitis C Endocrine Medical History: Negative for: Type I Diabetes; Type II Diabetes Genitourinary Medical History: Negative for: End Stage Renal Disease Immunological Medical History: Negative for: Lupus Erythematosus; Raynaudos; Scleroderma Integumentary (Skin) Medical History: Negative for: History of Burn; History of pressure wounds Musculoskeletal Medical History: Positive for: Gout; Rheumatoid Arthritis; Osteoarthritis Negative for: Osteomyelitis Neurologic Medical History: Negative for: Dementia; Neuropathy; Quadriplegia; Paraplegia; Seizure Disorder Oncologic Medical History: Negative for: Received Chemotherapy; Received Radiation Psychiatric Complaints and Symptoms: No Complaints or Symptoms Medical History: Positive for: Confinement Anxiety Negative for: Anorexia/bulimia Immunizations Pneumococcal Vaccine: Received Pneumococcal Vaccination: No [name], [name] ([id number]) Implantable Devices Family and Social History Cancer: Yes - Siblings; Diabetes: No; Heart Disease: No; Hypertension: No; Kidney  Disease: No; Lung Disease: No; Seizures: No; Stroke: No; Thyroid Problems: No; Tuberculosis: No; Former smoker - stopped 6 years ago; Marital Status - Separated; Alcohol Use: Rarely; Drug Use: No History; Caffeine Use: Moderate; Financial Concerns: No; Food, Clothing or Shelter Needs: No; Support System Lacking: No; Transportation Concerns: No; Advanced Directives: No; Patient does not want information on Advanced Directives; Do not resuscitate: No; Living Will: No; Medical Power of Attorney: No Physician Affirmation I have reviewed and agree with the above information. Electronic Signature(s) Signed: 04/05/2017 8:16:06 AM By: Worthy Keeler PA-C Signed: 04/06/2017 8:28:46 AM By: Roger Shelter Entered By: Worthy Keeler on 04/04/2017 16:26:08 [name] ([id number]) -------------------------------------------------------------------------------- SuperBill Details Patient Name: [name] Date of Service: 04/04/2017 Medical Record Number: [id number] Patient Account Number: [id number] Date of Birth/Sex: [birth year] (52 y.o. Female) Treating RN: Roger Shelter Primary Care Provider: Jonna Clark Other Clinician: Referring Provider: Jonna Clark Treating Provider/Extender: Melburn Hake, HOYT Weeks in Treatment: 11 Diagnosis Coding ICD-10 Codes Code Description [phone] Non-pressure chronic ulcer of right calf with fat layer exposed I87.331 Chronic venous hypertension (idiopathic) with ulcer and inflammation of right lower extremity L03.115 Cellulitis of right lower limb Facility Procedures CPT4 Code: [id number] Description: 75170 - WOUND CARE VISIT-LEV 2 EST PT Modifier: Quantity: 1 Physician Procedures CPT4: Description Modifier Quantity Code [id number] - WC PHYS LEVEL 3 - EST PT 1 ICD-10 Diagnosis Description L97.212 Non-pressure chronic ulcer of right calf with fat layer exposed I87.331 Chronic venous hypertension (idiopathic) with ulcer and  inflammation of right lower  extremity L03.115 Cellulitis of right lower limb Electronic Signature(s) Signed: 04/05/2017 8:16:06 AM By: Worthy Keeler PA-C Entered By: Worthy Keeler on 04/04/2017 16:27:28

## 2017-04-08 LAB — BASIC METABOLIC PANEL
BUN / CREAT RATIO: 18 (ref 9–23)
BUN: 15 mg/dL (ref 6–24)
CO2: 25 mmol/L (ref 20–29)
CREATININE: 0.83 mg/dL (ref 0.57–1.00)
Calcium: 9.2 mg/dL (ref 8.7–10.2)
Chloride: 108 mmol/L — ABNORMAL HIGH (ref 96–106)
GFR calc Af Amer: 94 mL/min/{1.73_m2} (ref >59)
GFR calc non Af Amer: 82 mL/min/{1.73_m2} (ref >59)
GLUCOSE: 83 mg/dL (ref 65–99)
Potassium: 4.4 mmol/L (ref 3.5–5.2)
Sodium: 149 mmol/L — ABNORMAL HIGH (ref 134–144)

## 2017-04-10 DIAGNOSIS — Z8639 Personal history of other endocrine, nutritional and metabolic disease: Secondary | ICD-10-CM | POA: Insufficient documentation

## 2017-04-10 DIAGNOSIS — Z86711 Personal history of pulmonary embolism: Secondary | ICD-10-CM | POA: Insufficient documentation

## 2017-04-10 DIAGNOSIS — J449 Chronic obstructive pulmonary disease, unspecified: Secondary | ICD-10-CM | POA: Insufficient documentation

## 2017-04-10 DIAGNOSIS — G4730 Sleep apnea, unspecified: Secondary | ICD-10-CM | POA: Insufficient documentation

## 2017-04-10 NOTE — Assessment & Plan Note (Signed)
Stable on CPAP nightly

## 2017-04-10 NOTE — Assessment & Plan Note (Signed)
Recheck BMP today. WIll adjust potassium if needed. Restarting spironolactone today so will need to recheck again at 1 month f/u.

## 2017-04-10 NOTE — Patient Instructions (Signed)
Follow up in 1 month for CPE

## 2017-04-10 NOTE — Assessment & Plan Note (Signed)
No issues with atorvastatin. Will recheck lipids at CPE

## 2017-04-10 NOTE — Assessment & Plan Note (Signed)
Stable and under good control w/o inhalers

## 2017-04-10 NOTE — Assessment & Plan Note (Signed)
Followed by [facility], pt no-showed appt last week so will recheck BMP today as requested by [facility] clinic but recommended she call and schedule back with them ASAP for f/u.

## 2017-04-10 NOTE — Assessment & Plan Note (Signed)
Will restart entire BP regimen today as still significantly elevated just back on several agents. Recheck BMP today.

## 2017-04-10 NOTE — Assessment & Plan Note (Signed)
Unknown type, will check TSH at [facility] and get records from prior providers in meantime

## 2017-04-10 NOTE — Assessment & Plan Note (Signed)
Rate well controlled

## 2017-04-10 NOTE — Assessment & Plan Note (Signed)
S/p 6 months of anticoagulation, stable with no new sxs since

## 2017-04-11 ENCOUNTER — Encounter: Payer: Medicaid Other | Admitting: Physician Assistant

## 2017-04-11 DIAGNOSIS — I87331 Chronic venous hypertension (idiopathic) with ulcer and inflammation of right lower extremity: Secondary | ICD-10-CM | POA: Diagnosis not present

## 2017-04-12 ENCOUNTER — Emergency Department: Payer: Medicaid Other

## 2017-04-12 ENCOUNTER — Emergency Department
Admission: EM | Admit: 2017-04-12 | Discharge: 2017-04-12 | Disposition: A | Discharge status: Home / Self Care | Payer: Medicaid Other | Attending: Emergency Medicine | Admitting: Emergency Medicine

## 2017-04-12 ENCOUNTER — Other Ambulatory Visit: Payer: Self-pay

## 2017-04-12 DIAGNOSIS — R197 Diarrhea, unspecified: Secondary | ICD-10-CM

## 2017-04-12 DIAGNOSIS — I110 Hypertensive heart disease with heart failure: Secondary | ICD-10-CM | POA: Diagnosis not present

## 2017-04-12 DIAGNOSIS — Z87891 Personal history of nicotine dependence: Secondary | ICD-10-CM | POA: Insufficient documentation

## 2017-04-12 DIAGNOSIS — R0602 Shortness of breath: Secondary | ICD-10-CM | POA: Diagnosis not present

## 2017-04-12 DIAGNOSIS — J449 Chronic obstructive pulmonary disease, unspecified: Secondary | ICD-10-CM | POA: Insufficient documentation

## 2017-04-12 DIAGNOSIS — I509 Heart failure, unspecified: Secondary | ICD-10-CM | POA: Diagnosis not present

## 2017-04-12 DIAGNOSIS — Z7982 Long term (current) use of aspirin: Secondary | ICD-10-CM | POA: Diagnosis not present

## 2017-04-12 DIAGNOSIS — Z79899 Other long term (current) drug therapy: Secondary | ICD-10-CM | POA: Diagnosis not present

## 2017-04-12 DIAGNOSIS — R112 Nausea with vomiting, unspecified: Secondary | ICD-10-CM

## 2017-04-12 DIAGNOSIS — R600 Localized edema: Secondary | ICD-10-CM | POA: Insufficient documentation

## 2017-04-12 DIAGNOSIS — R519 Headache, unspecified: Secondary | ICD-10-CM

## 2017-04-12 DIAGNOSIS — R51 Headache: Secondary | ICD-10-CM | POA: Insufficient documentation

## 2017-04-12 DIAGNOSIS — R1084 Generalized abdominal pain: Secondary | ICD-10-CM | POA: Diagnosis not present

## 2017-04-12 LAB — BASIC METABOLIC PANEL
Anion gap: 13 (ref 5–15)
BUN: 18 mg/dL (ref 6–20)
CHLORIDE: 103 mmol/L (ref 101–111)
CO2: 23 mmol/L (ref 22–32)
Calcium: 9 mg/dL (ref 8.9–10.3)
Creatinine, Ser: 0.72 mg/dL (ref 0.44–1.00)
GFR calc Af Amer: >60 mL/min (ref >60)
GFR calc non Af Amer: >60 mL/min (ref >60)
GLUCOSE: 131 mg/dL — AB (ref 65–99)
Potassium: 3 mmol/L — ABNORMAL LOW (ref 3.5–5.1)
Sodium: 139 mmol/L (ref 135–145)

## 2017-04-12 LAB — CBC
HCT: 39.8 % (ref 35.0–47.0)
Hemoglobin: 13.1 g/dL (ref 12.0–16.0)
MCH: 29.4 pg (ref 26.0–34.0)
MCHC: 33 g/dL (ref 32.0–36.0)
MCV: 89.3 fL (ref 80.0–100.0)
PLATELETS: 300 10*3/uL (ref 150–440)
RBC: 4.46 MIL/uL (ref 3.80–5.20)
RDW: 14.7 % — AB (ref 11.5–14.5)
WBC: 10.4 10*3/uL (ref 3.6–11.0)

## 2017-04-12 LAB — INFLUENZA PANEL BY PCR (TYPE A & B)
Influenza A By PCR: NEGATIVE
Influenza B By PCR: NEGATIVE

## 2017-04-12 LAB — HEPATIC FUNCTION PANEL
ALK PHOS: 59 U/L (ref 38–126)
ALT: 10 U/L — ABNORMAL LOW (ref 14–54)
AST: 14 U/L — ABNORMAL LOW (ref 15–41)
Albumin: 3.7 g/dL (ref 3.5–5.0)
BILIRUBIN DIRECT: 0.1 mg/dL (ref 0.1–0.5)
BILIRUBIN INDIRECT: 0.7 mg/dL (ref 0.3–0.9)
Total Bilirubin: 0.8 mg/dL (ref 0.3–1.2)
Total Protein: 7.5 g/dL (ref 6.5–8.1)

## 2017-04-12 LAB — POCT PREGNANCY, URINE: Preg Test, Ur: NEGATIVE

## 2017-04-12 LAB — TROPONIN I: Troponin I: <0.03 ng/mL (ref <0.03)

## 2017-04-12 LAB — LIPASE, BLOOD: Lipase: 28 U/L (ref 11–51)

## 2017-04-12 MED ORDER — METOPROLOL SUCCINATE ER 50 MG PO TB24
100.0000 mg | ORAL_TABLET | Freq: Once | ORAL | Status: AC
  Administered 2017-04-12: 100 mg via ORAL
  Filled 2017-04-12: qty 2

## 2017-04-12 MED ORDER — CLONIDINE HCL 0.1 MG PO TABS
0.1000 mg | ORAL_TABLET | Freq: Once | ORAL | Status: AC
  Administered 2017-04-12: 0.1 mg via ORAL
  Filled 2017-04-12: qty 1

## 2017-04-12 MED ORDER — METOCLOPRAMIDE HCL 5 MG/ML IJ SOLN
10.0000 mg | Freq: Once | INTRAMUSCULAR | Status: AC
  Administered 2017-04-12: 10 mg via INTRAVENOUS
  Filled 2017-04-12: qty 2

## 2017-04-12 MED ORDER — BUTALBITAL-APAP-CAFFEINE 50-325-40 MG PO TABS: 1.0000 | ORAL_TABLET | Freq: Four times a day (QID) | ORAL | 0 refills | Status: AC | PRN

## 2017-04-12 MED ORDER — LISINOPRIL 10 MG PO TABS
20.0000 mg | ORAL_TABLET | Freq: Once | ORAL | Status: AC
  Administered 2017-04-12: 20 mg via ORAL
  Filled 2017-04-12: qty 2

## 2017-04-12 MED ORDER — DIPHENHYDRAMINE HCL 50 MG/ML IJ SOLN
25.0000 mg | Freq: Once | INTRAMUSCULAR | Status: AC
  Administered 2017-04-12: 25 mg via INTRAVENOUS
  Filled 2017-04-12: qty 1

## 2017-04-12 MED ORDER — HYDRALAZINE HCL 50 MG PO TABS
50.0000 mg | ORAL_TABLET | Freq: Once | ORAL | Status: AC
  Administered 2017-04-12: 50 mg via ORAL
  Filled 2017-04-12: qty 1

## 2017-04-12 MED ORDER — METHYLPREDNISOLONE SODIUM SUCC 125 MG IJ SOLR
125.0000 mg | Freq: Once | INTRAMUSCULAR | Status: AC
Start: 2017-04-12 — End: 2017-04-12
  Administered 2017-04-12: 125 mg via INTRAVENOUS
  Filled 2017-04-12: qty 2

## 2017-04-12 MED ORDER — MAGNESIUM SULFATE 2 GM/50ML IV SOLN
2.0000 g | Freq: Once | INTRAVENOUS | Status: AC
  Administered 2017-04-12: 2 g via INTRAVENOUS
  Filled 2017-04-12: qty 50

## 2017-04-12 MED ORDER — KETOROLAC TROMETHAMINE 30 MG/ML IJ SOLN
30.0000 mg | Freq: Once | INTRAMUSCULAR | Status: AC
  Administered 2017-04-12: 30 mg via INTRAVENOUS
  Filled 2017-04-12: qty 1

## 2017-04-12 MED ORDER — SPIRONOLACTONE 25 MG PO TABS
50.0000 mg | ORAL_TABLET | Freq: Once | ORAL | Status: AC
  Administered 2017-04-12: 50 mg via ORAL
  Filled 2017-04-12: qty 2

## 2017-04-12 MED ORDER — IOPAMIDOL (ISOVUE-300) INJECTION 61%
100.0000 mL | Freq: Once | INTRAVENOUS | Status: AC | PRN
  Administered 2017-04-12: 100 mL via INTRAVENOUS

## 2017-04-12 MED ORDER — FENTANYL CITRATE (PF) 100 MCG/2ML IJ SOLN
50.0000 ug | Freq: Once | INTRAMUSCULAR | Status: AC
  Administered 2017-04-12: 50 ug via INTRAVENOUS
  Filled 2017-04-12: qty 2

## 2017-04-12 MED ORDER — ONDANSETRON 4 MG PO TBDP: 4.0000 mg | ORAL_TABLET | Freq: Three times a day (TID) | ORAL | 0 refills | Status: DC | PRN

## 2017-04-12 MED ORDER — AMLODIPINE BESYLATE 5 MG PO TABS
10.0000 mg | ORAL_TABLET | Freq: Once | ORAL | Status: AC
  Administered 2017-04-12: 10 mg via ORAL
  Filled 2017-04-12: qty 2

## 2017-04-12 MED ORDER — ONDANSETRON HCL 4 MG/2ML IJ SOLN
4.0000 mg | Freq: Once | INTRAMUSCULAR | Status: AC
  Administered 2017-04-12: 4 mg via INTRAVENOUS
  Filled 2017-04-12: qty 2

## 2017-04-12 NOTE — Discharge Instructions (Signed)
Please follow up with your primary care physician.  Your workup at this time is unremarkable.  Please return with any other concerns or any worsening conditions.

## 2017-04-12 NOTE — Progress Notes (Signed)
[name], [name] ([id number]) Visit Report for 04/11/2017 Chief Complaint Document Details Patient Name: [name], [name] Date of Service: 04/11/2017 11:15 AM Medical Record Number: [id number] Patient Account Number: [id number] Date of Birth/Sex: 08-Dec-1965 (52 y.o. Female) Treating RN: Roger Shelter Primary Care Provider: Merrie Roof Other Clinician: Referring Provider: Jonna Clark Treating Provider/Extender: Melburn Hake, HOYT Weeks in Treatment: 12 Information Obtained from: Patient Chief Complaint Right LE Ulcer Electronic Signature(s) Signed: 04/12/2017 12:14:27 AM By: Worthy Keeler PA-C Entered By: Worthy Keeler on 04/11/2017 11:18:57 [name], [name] ([id number]) -------------------------------------------------------------------------------- Debridement Details Patient Name: [name] Date of Service: 04/11/2017 11:15 AM Medical Record Number: [id number] Patient Account Number: [id number] Date of Birth/Sex: 06-Jan-1966 (52 y.o. Female) Treating RN: Roger Shelter Primary Care Provider: Merrie Roof Other Clinician: Referring Provider: Jonna Clark Treating Provider/Extender: Melburn Hake, HOYT Weeks in Treatment: 12 Debridement Performed for Wound #1 Right,Anterior Lower Leg Assessment: Performed By: Physician STONE III, HOYT E., PA-C Debridement: Debridement Pre-procedure Verification/Time Yes - 11:57 Out Taken: Start Time: 11:57 Pain Control: Other : lidocaine 4% Level: Skin/Subcutaneous Tissue Total Area Debrided (L x W): 2.5 (cm) x 2.3 (cm) = 5.75 (cm) Tissue and other material Viable, Non-Viable, Fibrin/Slough, Skin, Subcutaneous debrided: Instrument: Curette Bleeding: Minimum Hemostasis Achieved: Pressure End Time: 11:57 Procedural Pain: 0 Post Procedural Pain: 0 Response to Treatment: Procedure was tolerated well Post Debridement Measurements of Total Wound Length: (cm) 2.5 Width: (cm) 2.3 Depth: (cm) 0.3 Volume: (cm) 1.355 Character of  Wound/Ulcer Post Debridement: Stable Post Procedure Diagnosis Same as Pre-procedure Electronic Signature(s) Signed: 04/11/2017 4:59:35 PM By: Roger Shelter Signed: 04/12/2017 12:14:27 AM By: Worthy Keeler PA-C Entered By: Roger Shelter on 04/11/2017 11:58:16 [name], [name] ([id number]) -------------------------------------------------------------------------------- HPI Details Patient Name: [name] Date of Service: 04/11/2017 11:15 AM Medical Record Number: [id number] Patient Account Number: [id number] Date of Birth/Sex: 1965/04/21 (52 y.o. Female) Treating RN: Roger Shelter Primary Care Provider: Merrie Roof Other Clinician: Referring Provider: Jonna Clark Treating Provider/Extender: Melburn Hake, HOYT Weeks in Treatment: 12 History of Present Illness HPI Description: 01/12/17; this is a 52 year old nondiabetic remote ex-smoker. She tells me that sometime in early November she developed a sudden painful area on her right medial lower leg while she was in bed at night she reached down and scratch the area and woke up in the morning with a scab small wound. This gradually got worse over time. She was seen in the ER on 12/18/16. At that point the possibility of an insect bite/brown recluse spider bite was brought up. At that point it was 1 cm eschar covered wound. She was treated with Bactrim DS. She was back in the ER on 01/09/17 with increasing size pain of the wound. At this point the patient states it was covered with a black eschar at surface. An x-ray showed punctate radio density material within the wound but no osteomyelitis. 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 She was put on clindamycin. The patient was seen on 12/7 by vascular surgery. At that point noting a 3 x 3 cm necrotic wound. ABIs were ordered. She was treated with Silvadene cream. I don't see the results of the ABIs at this point. She is using Silvadene cream. A comprehensive metabolic panel CBC  and differential PT and PTT were done which were also normal. 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 COPD/asthma/depression and a history of atrial fib. She tells me she had a history of vein stripping many years ago ABIs in our clinic were noncompressible  on the right at 1.59 01/19/17; right medial lower leg wound. Small wound with some depth. Still requiring debridement we are using Santyl. She has vascular studies on January 9 but at this point I can't tell whether these are arterial venous or both. They are at [facility] 01/27/16; right medial lower leg wound. She has arterial and venous studies later this month. The wound looks somewhat better after debridement. We've been using Santyl. 02/08/17 on evaluation today patient has had for arterial studies performed. This showed noncompressible measurements in regard to the ABI although she did have tried phasic flow and it stated in the report that she did have good TBI's. With that being said I see no documentation of TBI's and I cannot find the final report in epic only see what the patient brought in to show me today. We obviously are going to need to contact the office to see about getting the final report. We have been using Santyl and this does seem to be helping the wound to a degree. Patient is having no significant pain. 03/21/17 on evaluation today patient appears to be a little bit larger in regard to her ulcer. It has actually been almost a month since we have last seen her. She states that she has continued to use the [place] during that time. With that being said she is noting that she does have "pus" noted on the surface of the wound. Upon further inspection it actually appears that this is more slough and not pass noted at this point. There is no erythema surrounding the wound and nothing that would suggest that she is having an infection at this point. Her blood pressure is  significantly high at this time. She states that she is just about out of blood pressure medication obviously we did tell her we cannot prescribed this needs to see her primary care provider as soon as possible. 04/04/17 on evaluation today patient states that over the past week she's been having a lot of significant burning with her dressings. We did switch her to the Iodoflex recently and I believe this may be the reason that she's been experiencing the burning type sensations. Nonetheless she really did not tell me about this as much last week and even over the past week she did not call list in order to tell us that she was having any issues. Nonetheless the wound does appear to be better. 04/11/17 on evaluation today patient actually appears to be doing fairly well in regard to her ulcer. There is some Slough noted on the surface of the wound but no evidence of infection, erythema, or significant pain. She does tell us that she received a new blood pressure medication from her primary care physician on Friday however she has not picked that up as of yet. She [place], [place] ([id number]) is actually going to pick it up when she leaves here today. Nonetheless her blood pressure was significantly high unfortunately today. The good news is her dressing does not seem to be causing her any difficulties like it did with the Iodoflex. Electronic Signature(s) Signed: 04/12/2017 12:14:27 AM By: Worthy Keeler PA-C Entered By: Worthy Keeler on 04/11/2017 12:34:10 [place], [name] ([id number]) -------------------------------------------------------------------------------- Physical Exam Details Patient Name: [name] Date of Service: 04/11/2017 11:15 AM Medical Record Number: [id number] Patient Account Number: [id number] Date of Birth/Sex: 06-21-1965 (51 y.o. Female) Treating RN: Roger Shelter Primary Care Provider: Merrie Roof Other Clinician: Referring Provider: Jonna Clark Treating  Provider/Extender: Joaquim Lai  III, HOYT Weeks in Treatment: 48 Constitutional Well-nourished and well-hydrated in no acute distress. Respiratory normal breathing without difficulty. Psychiatric this patient is able to make decisions and demonstrates good insight into disease process. Alert and Oriented x 3. pleasant and cooperative. Notes Patient's wound did have slough covering the surface of the wound which required sharp debridement today and she tolerated this without complication. Post debridement the wound did appear to be doing much better. Electronic Signature(s) Signed: 04/12/2017 12:14:27 AM By: Worthy Keeler PA-C Entered By: Worthy Keeler on 04/11/2017 12:34:55 [place], [name] ([id number]) -------------------------------------------------------------------------------- Physician Orders Details Patient Name: [name] Date of Service: 04/11/2017 11:15 AM Medical Record Number: [id number] Patient Account Number: [id number] Date of Birth/Sex: 1965/01/30 (52 y.o. Female) Treating RN: Roger Shelter Primary Care Provider: Merrie Roof Other Clinician: Referring Provider: Jonna Clark Treating Provider/Extender: Melburn Hake, HOYT Weeks in Treatment: 12 Verbal / Phone Orders: No Diagnosis Coding ICD-10 Coding Code Description [phone] Non-pressure chronic ulcer of right calf with fat layer exposed I87.331 Chronic venous hypertension (idiopathic) with ulcer and inflammation of right lower extremity L03.115 Cellulitis of right lower limb Wound Cleansing Wound #1 Right,Anterior Lower Leg o Clean wound with Normal Saline. Anesthetic (add to Medication List) Wound #1 Right,Anterior Lower Leg o Topical Lidocaine 4% cream applied to wound bed prior to debridement (In Clinic Only). o Hurricaine Topical Anesthetic Spray applied to wound bed prior to debridement (In Clinic Only). Primary Wound Dressing Wound #1 Right,Anterior Lower Leg o Hydrafera Blue Secondary  Dressing Wound #1 Right,Anterior Lower Leg o Boardered Foam Dressing Dressing Change Frequency Wound #1 Right,Anterior Lower Leg o Change dressing every other day. Follow-up Appointments o Return Appointment in 1 week. Additional Orders / Instructions o Vitamin A; Vitamin C, Zinc o Increase protein intake. Electronic Signature(s) Signed: 04/11/2017 4:59:35 PM By: Roger Shelter Signed: 04/12/2017 12:14:27 AM By: Worthy Keeler PA-C Entered By: Roger Shelter on 04/11/2017 11:59:34 [place], [name] ([id number]) -------------------------------------------------------------------------------- Problem List Details Patient Name: [name] Date of Service: 04/11/2017 11:15 AM Medical Record Number: [id number] Patient Account Number: [id number] Date of Birth/Sex: November 30, 1965 (52 y.o. Female) Treating RN: Roger Shelter Primary Care Provider: Merrie Roof Other Clinician: Referring Provider: Jonna Clark Treating Provider/Extender: Melburn Hake, HOYT Weeks in Treatment: 12 Active Problems ICD-10 Encounter Code Description Active Date Diagnosis L97.212 Non-pressure chronic ulcer of right calf with fat layer exposed 01/12/2017 Yes I87.331 Chronic venous hypertension (idiopathic) with ulcer and 01/12/2017 Yes inflammation of right lower extremity L03.115 Cellulitis of right lower limb 01/12/2017 Yes Inactive Problems Resolved Problems Electronic Signature(s) Signed: 04/12/2017 12:14:27 AM By: Worthy Keeler PA-C Entered By: Worthy Keeler on 04/11/2017 11:17:10 [place], [name] ([id number]) -------------------------------------------------------------------------------- Progress Note Details Patient Name: [name] Date of Service: 04/11/2017 11:15 AM Medical Record Number: [id number] Patient Account Number: [id number] Date of Birth/Sex: 07-27-1965 (52 y.o. Female) Treating RN: Roger Shelter Primary Care Provider: Merrie Roof Other Clinician: Referring Provider:  Jonna Clark Treating Provider/Extender: Melburn Hake, HOYT Weeks in Treatment: 12 Subjective Chief Complaint Information obtained from Patient Right LE Ulcer History of Present Illness (HPI) 01/12/17; this is a 52 year old nondiabetic remote ex-smoker. She tells me that sometime in early November she developed a sudden painful area on her right medial lower leg while she was in bed at night she reached down and scratch the area and woke up in the morning with a scab small wound. This gradually got worse over time. She was seen in the ER on 12/18/16. At that point the possibility of an insect  bite/brown recluse spider bite was brought up. At that point it was 1 cm eschar covered wound. She was treated with Bactrim DS. She was back in the ER on 01/09/17 with increasing size pain of the wound. At this point the patient states it was covered with a black eschar at surface. An x-ray showed punctate radio density material within the wound but no osteomyelitis. 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 She was put on clindamycin. The patient was seen on 12/7 by vascular surgery. At that point noting a 3 x 3 cm necrotic wound. ABIs were ordered. She was treated with Silvadene cream. I don't see the results of the ABIs at this point. She is using Silvadene cream. A comprehensive metabolic panel CBC and differential PT and PTT were done which were also normal. 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 COPD/asthma/depression and a history of atrial fib. She tells me she had a history of vein stripping many years ago ABIs in our clinic were noncompressible on the right at 1.59 01/19/17; right medial lower leg wound. Small wound with some depth. Still requiring debridement we are using Santyl. She has vascular studies on January 9 but at this point I can't tell whether these are arterial venous or both. They are at [facility] 01/27/16; right medial  lower leg wound. She has arterial and venous studies later this month. The wound looks somewhat better after debridement. We've been using Santyl. 02/08/17 on evaluation today patient has had for arterial studies performed. This showed noncompressible measurements in regard to the ABI although she did have tried phasic flow and it stated in the report that she did have good TBI's. With that being said I see no documentation of TBI's and I cannot find the final report in epic only see what the patient brought in to show me today. We obviously are going to need to contact the office to see about getting the final report. We have been using Santyl and this does seem to be helping the wound to a degree. Patient is having no significant pain. 03/21/17 on evaluation today patient appears to be a little bit larger in regard to her ulcer. It has actually been almost a month since we have last seen her. She states that she has continued to use the [place] during that time. With that being said she is noting that she does have "pus" noted on the surface of the wound. Upon further inspection it actually appears that this is more slough and not pass noted at this point. There is no erythema surrounding the wound and nothing that would suggest that she is having an infection at this point. Her blood pressure is significantly high at this time. She states that she is just about out of blood pressure medication obviously we did tell her we cannot prescribed this needs to see her primary care provider as soon as possible. 04/04/17 on evaluation today patient states that over the past week she's been having a lot of significant burning with her dressings. We did switch her to the Iodoflex recently and I believe this may be the reason that she's been experiencing the burning type sensations. Nonetheless she really did not tell me about this as much last week and even over the past week she did not call list in order to  tell us that she was having any issues. Nonetheless the wound does appear to be better. [place], [name] ([id number])  04/11/17 on evaluation today patient actually appears to be doing fairly well in regard to her ulcer. There is some Slough noted on the surface of the wound but no evidence of infection, erythema, or significant pain. She does tell us that she received a new blood pressure medication from her primary care physician on Friday however she has not picked that up as of yet. She is actually going to pick it up when she leaves here today. Nonetheless her blood pressure was significantly high unfortunately today. The good news is her dressing does not seem to be causing her any difficulties like it did with the Iodoflex. Patient History Information obtained from Patient. Family History Cancer - Siblings, No family history of Diabetes, Heart Disease, Hypertension, Kidney Disease, Lung Disease, Seizures, Stroke, Thyroid Problems, Tuberculosis. Social History Former smoker - stopped 6 years ago, Marital Status - Separated, Alcohol Use - Rarely, Drug Use - No History, Caffeine Use - Moderate. Review of Systems (ROS) Constitutional Symptoms (General Health) Denies complaints or symptoms of Fever, Chills. Respiratory The patient has no complaints or symptoms. Cardiovascular The patient has no complaints or symptoms. Psychiatric The patient has no complaints or symptoms. Objective Constitutional Well-nourished and well-hydrated in no acute distress. Vitals Time Taken: 11:26 AM, Height: 70 in, Weight: 225 lbs, BMI: 32.3, Temperature: 98.4 F, Pulse: 80 bpm, Respiratory Rate: 16 breaths/min, Blood Pressure: 162/120 mmHg. General Notes: Patient went to PCP on Friday regarding elevated BP. PCP increased medication, patient states she had not taken medication today and will leave here and pick up new dosage. Patient to check BP when home 1 hour after taking medication and call PCP if it is  still elevated. Respiratory normal breathing without difficulty. Psychiatric this patient is able to make decisions and demonstrates good insight into disease process. Alert and Oriented x 3. pleasant and cooperative. General Notes: Patient's wound did have slough covering the surface of the wound which required sharp debridement today [place], [place] ([id number]) and she tolerated this without complication. Post debridement the wound did appear to be doing much better. Integumentary (Hair, Skin) Wound #1 status is Open. Original cause of wound was Trauma. The wound is located on the Right,Anterior Lower Leg. The wound measures 2.5cm length x 2.3cm width x 0.2cm depth; 4.516cm^2 area and 0.903cm^3 volume. There is Fat Layer (Subcutaneous Tissue) Exposed exposed. There is no tunneling or undermining noted. There is a large amount of serous drainage noted. The wound margin is flat and intact. There is small (1-33%) pink granulation within the wound bed. There is a large (67-100%) amount of necrotic tissue within the wound bed including Adherent Slough. The periwound skin appearance exhibited: Hemosiderin Staining. The periwound skin appearance did not exhibit: Callus, Crepitus, Excoriation, Induration, Rash, Scarring, Dry/Scaly, Maceration, Atrophie Blanche, Cyanosis, Ecchymosis, Mottled, Pallor, Rubor, Erythema. Periwound temperature was noted as No Abnormality. The periwound has tenderness on palpation. Assessment Active Problems ICD-10 L97.212 - Non-pressure chronic ulcer of right calf with fat layer exposed I87.331 - Chronic venous hypertension (idiopathic) with ulcer and inflammation of right lower extremity L03.115 - Cellulitis of right lower limb Procedures Wound #1 Pre-procedure diagnosis of Wound #1 is a Trauma, Other located on the Right,Anterior Lower Leg . There was a Skin/Subcutaneous Tissue Debridement ([id number]) debridement with total area of 5.75 sq cm performed by STONE  III, HOYT E., PA-C. with the following instrument(s): Curette to remove Viable and Non-Viable tissue/material including Fibrin/Slough, Skin, and Subcutaneous after achieving pain control using Other (lidocaine 4%). A time out  was conducted at 11:57, prior to the start of the procedure. A Minimum amount of bleeding was controlled with Pressure. The procedure was tolerated well with a pain level of 0 throughout and a pain level of 0 following the procedure. Post Debridement Measurements: 2.5cm length x 2.3cm width x 0.3cm depth; 1.355cm^3 volume. Character of Wound/Ulcer Post Debridement is stable. Post procedure Diagnosis Wound #1: Same as Pre-Procedure Plan Wound Cleansing: Wound #1 Right,Anterior Lower Leg: Clean wound with Normal Saline. Anesthetic (add to Medication List): Wound #1 Right,Anterior Lower Leg: Topical Lidocaine 4% cream applied to wound bed prior to debridement (In Clinic Only). Hurricaine Topical Anesthetic Spray applied to wound bed prior to debridement (In Clinic Only). Primary Wound Dressing: Wound #1 Right,Anterior Lower Leg: Welte, [place] ([id number]) Hydrafera Blue Secondary Dressing: Wound #1 Right,Anterior Lower Leg: Boardered Foam Dressing Dressing Change Frequency: Wound #1 Right,Anterior Lower Leg: Change dressing every other day. Follow-up Appointments: Return Appointment in 1 week. Additional Orders / Instructions: Vitamin A; Vitamin C, Zinc Increase protein intake. I am going to recommend at this point that we continue with the Current wound care measures as this does not seem to be burning her as much as the Iodoflex did. We will see were things stand in one weeks time. Please see above for specific wound care orders. We will see patient for re-evaluation in 1 week(s) here in the clinic. If anything worsens or changes patient will contact our office for additional recommendations. Electronic Signature(s) Signed: 04/12/2017 12:14:27 AM By: Worthy Keeler PA-C Entered By: Worthy Keeler on 04/11/2017 12:35:19 [place], [name] ([id number]) -------------------------------------------------------------------------------- ROS/PFSH Details Patient Name: [name] Date of Service: 04/11/2017 11:15 AM Medical Record Number: [id number] Patient Account Number: [id number] Date of Birth/Sex: 30-Apr-1965 (52 y.o. Female) Treating RN: Roger Shelter Primary Care Provider: Merrie Roof Other Clinician: Referring Provider: Jonna Clark Treating Provider/Extender: Melburn Hake, HOYT Weeks in Treatment: 12 Information Obtained From Patient Wound History Do you currently have one or more open woundso Yes How many open wounds do you currently haveo 1 Approximately how long have you had your woundso one month How have you been treating your wound(s) until nowo 3 weeks Has your wound(s) ever healed and then re-openedo Yes Have you had any lab work done in the past montho Yes Have you tested positive for an antibiotic resistant organism (MRSA, VRE)o No Have you tested positive for osteomyelitis (bone infection)o No Have you had any tests for circulation on your legso Yes Where was the test doneo [facility] Have you had other problems associated with your woundso Infection Constitutional Symptoms (General Health) Complaints and Symptoms: Negative for: Fever; Chills Eyes Medical History: Negative for: Cataracts; Glaucoma; Optic Neuritis Ear/Nose/Mouth/Throat Medical History: Negative for: Chronic sinus problems/congestion; Middle ear problems Hematologic/Lymphatic Medical History: Negative for: Anemia; Hemophilia; Human Immunodeficiency Virus; Lymphedema; Sickle Cell Disease Respiratory Complaints and Symptoms: No Complaints or Symptoms Medical History: Positive for: Chronic Obstructive Pulmonary Disease (COPD); Sleep Apnea Negative for: Aspiration; Asthma; Pneumothorax; Tuberculosis Cardiovascular Complaints and Symptoms: No Complaints  or Symptoms [place], [place] ([id number]) Medical History: Positive for: Arrhythmia; Congestive Heart Failure; Deep Vein Thrombosis - in lower legs and history of varicose veins; Hypertension Negative for: Angina; Coronary Artery Disease; Hypotension; Myocardial Infarction; Peripheral Arterial Disease; Peripheral Venous Disease; Phlebitis; Vasculitis Gastrointestinal Medical History: Negative for: Cirrhosis ; Colitis; Crohnos; Hepatitis A; Hepatitis B; Hepatitis C Endocrine Medical History: Negative for: Type I Diabetes; Type II Diabetes Genitourinary Medical History: Negative for: End Stage Renal Disease Immunological Medical History:  Negative for: Lupus Erythematosus; Raynaudos; Scleroderma Integumentary (Skin) Medical History: Negative for: History of Burn; History of pressure wounds Musculoskeletal Medical History: Positive for: Gout; Rheumatoid Arthritis; Osteoarthritis Negative for: Osteomyelitis Neurologic Medical History: Negative for: Dementia; Neuropathy; Quadriplegia; Paraplegia; Seizure Disorder Oncologic Medical History: Negative for: Received Chemotherapy; Received Radiation Psychiatric Complaints and Symptoms: No Complaints or Symptoms Medical History: Positive for: Confinement Anxiety Negative for: Anorexia/bulimia Immunizations Pneumococcal Vaccine: Received Pneumococcal Vaccination: No Sweaney, [name] ([id number]) Implantable Devices Family and Social History Cancer: Yes - Siblings; Diabetes: No; Heart Disease: No; Hypertension: No; Kidney Disease: No; Lung Disease: No; Seizures: No; Stroke: No; Thyroid Problems: No; Tuberculosis: No; Former smoker - stopped 6 years ago; Marital Status - Separated; Alcohol Use: Rarely; Drug Use: No History; Caffeine Use: Moderate; Financial Concerns: No; Food, Clothing or Shelter Needs: No; Support System Lacking: No; Transportation Concerns: No; Advanced Directives: No; Patient does not want information on Advanced  Directives; Do not resuscitate: No; Living Will: No; Medical Power of Attorney: No Physician Affirmation I have reviewed and agree with the above information. Electronic Signature(s) Signed: 04/11/2017 4:59:35 PM By: Roger Shelter Signed: 04/12/2017 12:14:27 AM By: Worthy Keeler PA-C Entered By: Worthy Keeler on 04/11/2017 12:34:35 [place], [name] ([id number]) -------------------------------------------------------------------------------- SuperBill Details Patient Name: [name] Date of Service: 04/11/2017 Medical Record Number: [id number] Patient Account Number: [id number] Date of Birth/Sex: 1965-05-20 (52 y.o. Female) Treating RN: Roger Shelter Primary Care Provider: Merrie Roof Other Clinician: Referring Provider: Jonna Clark Treating Provider/Extender: Melburn Hake, HOYT Weeks in Treatment: 12 Diagnosis Coding ICD-10 Codes Code Description [phone] Non-pressure chronic ulcer of right calf with fat layer exposed I87.331 Chronic venous hypertension (idiopathic) with ulcer and inflammation of right lower extremity L03.115 Cellulitis of right lower limb Facility Procedures CPT4 Code: 82423536 Description: 14431 - DEB SUBQ TISSUE 20 SQ CM/< ICD-10 Diagnosis Description L97.212 Non-pressure chronic ulcer of right calf with fat layer exp Modifier: osed Quantity: 1 Physician Procedures CPT4 Code: [id number] Description: 11042 - WC PHYS SUBQ TISS 20 SQ CM ICD-10 Diagnosis Description L97.212 Non-pressure chronic ulcer of right calf with fat layer exp Modifier: osed Quantity: 1 Electronic Signature(s) Signed: 04/12/2017 12:14:27 AM By: Worthy Keeler PA-C Entered By: Worthy Keeler on 04/11/2017 12:35:44

## 2017-04-12 NOTE — Progress Notes (Signed)
Tried calling patient, no VM set up yet. Will try again later. Patient currently in hospital.

## 2017-04-12 NOTE — ED Triage Notes (Signed)
Pt arrives to ED via [facility] from home with c/o facial and throat swelling and headache x1.5 hrs. Pt denies any new medications. EMS reports pt with h/x of HTN and initial BP on their arrival was 210/144. Pt is A&O, in NAD; RR even, regular, and unlabored. Pt is able to speak clearly and in complete sentences w/o difficulty.

## 2017-04-12 NOTE — ED Provider Notes (Signed)
[facility] Emergency Department Provider Note   ____________________________________________   First MD Initiated Contact with Patient 04/12/17 0257     (approximate)  I have reviewed the triage vital signs and the nursing notes.   HISTORY  Chief Complaint Facial Swelling; Hypertension; and Headache    HPI [name] is a 52 y.o. female who comes into the hospital today stating that she is feeling sick.  The patient has a headache with some diarrhea and vomiting.  The patient states that her throat is swelling and she feels hot.  She woke up at around 1 AM with the symptoms.  The patient also has some mid abdominal pain.  She reports that her daughter was here yesterday with an allergic reaction.  The patient denies any fevers but she feels short of breath and has been vomiting.  When I walked into the room the patient was having an episode of diarrhea.  She just keeps writhing on the bed stating that she feels sick and she is hot.  She is here today for evaluation.   Past Medical History:  Diagnosis Date  . A-fib ([place]) 2010  . Adrenal adenoma, left   . Alcohol abuse   . Allergy   . CHF (congestive heart failure) ([place])   . COPD (chronic obstructive pulmonary disease) ([place])   . Fatty liver   . GERD (gastroesophageal reflux disease)   . Gout   . Hypertension   . Mild cognitive impairment   . Non-healing non-surgical wound    right leg  . Obesity   . Pulmonary embolus ([place])   . Sleep apnea   . Stroke ([place])   . Thyroid disease     Patient Active Problem List   Diagnosis Date Noted  . COPD (chronic obstructive pulmonary disease) ([place]) 04/10/2017  . Sleep apnea 04/10/2017  . History of pulmonary embolism 04/10/2017  . History of thyroid disease 04/10/2017  . Bug bite 12/31/2016  . Hyperlipidemia 12/31/2016  . Essential hypertension 05/26/2016  . Chronic diastolic heart failure ([place]) 05/26/2016  . Hypokalemia 05/26/2016  . A-fib ([place])  01/26/2008    Past Surgical History:  Procedure Laterality Date  . bilateral wrist fractures  2010  . lt ankle fracture  2002    Prior to Admission medications   Medication Sig Start Date End Date Taking? Authorizing Provider  amLODipine (NORVASC) 10 MG tablet Take 1 tablet (10 mg total) by mouth daily. 04/07/17   Volney American, PA-C  aspirin EC 81 MG tablet Take 81 mg by mouth daily.    [provider]  atorvastatin (LIPITOR) 20 MG tablet Take 1 tablet (20 mg total) by mouth daily. 04/07/17   Volney American, PA-C  butalbital-acetaminophen-caffeine [place], ESGIC) [phone] MG tablet Take 1-2 tablets by mouth every 6 (six) hours as needed for headache. 04/12/17 04/12/18  Loney Hering, MD  cloNIDine (CATAPRES) 0.1 MG tablet Take 1 tablet (0.1 mg total) by mouth 2 (two) times daily. 04/07/17   Volney American, PA-C  diphenhydrAMINE (BENADRYL) 25 MG tablet Take 25 mg by mouth every 6 (six) hours as needed.    [provider]  furosemide (LASIX) 40 MG tablet Take 1 tablet (40 mg total) by mouth daily. 03/24/17 06/22/17  Alisa Graff, FNP  hydrALAZINE (APRESOLINE) 50 MG tablet Take 1 tablet (50 mg total) by mouth 3 (three) times daily. 04/07/17   Volney American, PA-C  lisinopril (PRINIVIL,ZESTRIL) 20 MG tablet Take 1 tablet (20 mg  total) by mouth daily. 03/24/17   Alisa Graff, FNP  metoprolol succinate (TOPROL-XL) 100 MG 24 hr tablet Take 1 tablet (100 mg total) by mouth daily. Take with or immediately following a meal. 04/07/17   Volney American, PA-C  ondansetron (ZOFRAN ODT) 4 MG disintegrating tablet Take 1 tablet (4 mg total) by mouth every 8 (eight) hours as needed for nausea or vomiting. 04/12/17   Loney Hering, MD  potassium chloride SA (KLOR-CON M20) 20 MEQ tablet Take 1 tablet (20 mEq total) by mouth daily. 03/24/17   Alisa Graff, FNP  sertraline (ZOLOFT) 100 MG tablet Take 1 tablet (100 mg total) by mouth daily.  04/07/17   Volney American, PA-C  spironolactone (ALDACTONE) 25 MG tablet Take 2 tablets (50 mg total) by mouth daily. 04/07/17   Volney American, PA-C    Allergies Morphine and related and Penicillins  Family History  Problem Relation Age of Onset  . Prostate cancer Father   . Rectal cancer Sister   . Breast cancer Sister   . Cancer Maternal Grandmother   . Cancer Maternal Grandfather     Social History Social History   Tobacco Use  . Smoking status: Former Smoker    Types: Cigarettes    Last attempt to quit: 01/26/2016    Years since quitting: 1.2  . Smokeless tobacco: Never Used  Substance Use Topics  . Alcohol use: Yes    Comment: occ  . Drug use: No    Review of Systems  Constitutional: No fever/chills Eyes: No visual changes. ENT: No sore throat. Cardiovascular: Denies chest pain. Respiratory: Denies shortness of breath. Gastrointestinal:  abdominal pain.  Nausea, vomiting, diarrhea.  No constipation. Genitourinary: Negative for dysuria. Musculoskeletal: Negative for back pain. Skin: Negative for rash. Neurological: Headache   ____________________________________________   PHYSICAL EXAM:  VITAL SIGNS: ED Triage Vitals  Enc Vitals Group     BP 04/12/17 0256 (!) 186/126     Pulse Rate 04/12/17 0256 74     Resp 04/12/17 0256 18     Temp 04/12/17 0256 98 F (36.7 C)     Temp Source 04/12/17 0256 Oral     SpO2 04/12/17 0256 98 %     Weight 04/12/17 0250 232 lb (105.2 kg)     Height 04/12/17 0250 5\' 10"  (1.778 m)     Head Circumference --      Peak Flow --      Pain Score 04/12/17 0250 10     Pain Loc --      Pain Edu? --      Excl. in [place]? --     Constitutional: Alert and oriented. Well appearing and in moderate distress. Eyes: Conjunctivae are normal. PERRL. EOMI. Head: Atraumatic. Nose: No congestion/rhinnorhea. Mouth/Throat: Mucous membranes are moist.  Oropharynx non-erythematous. Cardiovascular: Normal rate, regular rhythm.  Grossly normal heart sounds.  Good peripheral circulation. Respiratory: Normal respiratory effort.  No retractions. Lungs CTAB. Gastrointestinal: Soft with some diffuse abdominal pain to palpation. No distention.  Positive bowel sounds Musculoskeletal: No lower extremity tenderness nor edema.   Neurologic:  Normal speech and language.   Skin:  Skin is warm, dry and intact.  Psychiatric: Mood and affect are normal.   ____________________________________________   LABS (all labs ordered are listed, but only abnormal results are displayed)  Labs Reviewed  BASIC METABOLIC PANEL - Abnormal; Notable for the following components:      Result Value   Potassium 3.0 (*)  Glucose, Bld 131 (*)    All other components within normal limits  CBC - Abnormal; Notable for the following components:   RDW 14.7 (*)    All other components within normal limits  HEPATIC FUNCTION PANEL - Abnormal; Notable for the following components:   AST 14 (*)    ALT 10 (*)    All other components within normal limits  TROPONIN I  LIPASE, BLOOD  INFLUENZA PANEL BY PCR (TYPE A & B)  POC URINE PREG, ED  POCT PREGNANCY, URINE   ____________________________________________  EKG  ED ECG REPORT I, Loney Hering, the attending physician, personally viewed and interpreted this ECG.   Date: 04/12/2017  EKG Time: 250  Rate: 71  Rhythm: normal sinus rhythm  Axis: normal  Intervals:prolonged qtc  ST&T Change: none  ____________________________________________  RADIOLOGY  ED MD interpretation:  CXR:.unchanged cardiomegaly, minimal vascular congestion without overt pulmonary edema  CT abd and pelvis: No acute intra abdominal process, diffuse hepatic steatosis, punctate bilateral nonobstructive nephrolithiasis  Official radiology report(s): Dg Chest 2 View  Result Date: 04/12/2017 CLINICAL DATA:  Shortness of breath. EXAM: CHEST - 2 VIEW COMPARISON:  Radiograph 05/22/2016 FINDINGS: Cardiomegaly is  unchanged. Tortuous thoracic aorta is unchanged. Minimal vascular congestion without convincing pulmonary edema. No pneumothorax, focal airspace disease, pleural effusion or pneumothorax. Multiple bilateral chronic rib fractures. Compression fracture in the midthoracic spine is unchanged. Remote sternal fracture. IMPRESSION: 1. Unchanged cardiomegaly and tortuous thoracic aorta. 2. Minimal vascular congestion without overt pulmonary edema. Electronically Signed   By: Jeb Levering M.D.   On: 04/12/2017 03:33   Ct Abdomen Pelvis W Contrast  Result Date: 04/12/2017 CLINICAL DATA:  Nausea and vomiting. EXAM: CT ABDOMEN AND PELVIS WITH CONTRAST TECHNIQUE: Multidetector CT imaging of the abdomen and pelvis was performed using the standard protocol following bolus administration of intravenous contrast. CONTRAST:  142mL ISOVUE-300 IOPAMIDOL (ISOVUE-300) INJECTION 61% COMPARISON:  CT abdomen pelvis dated January 14, 2014. FINDINGS: Lower chest: No acute abnormality. Hepatobiliary: Diffuse hepatic steatosis. No focal liver abnormality. The gallbladder is unremarkable. No biliary dilatation. Pancreas: Unremarkable. No pancreatic ductal dilatation or surrounding inflammatory changes. Spleen: Punctate calcified granuloma in the spleen. Otherwise unremarkable. Normal in size. Adrenals/Urinary Tract: Unchanged 3.5 x 2.2 cm left adrenal nodule, stable since January 2014, benign. The right adrenal gland is unremarkable. Punctate nonobstructive bilateral renal calculi. Slight interval increase in size of the 1.2 cm simple cyst in the left kidney. Other subcentimeter low-density lesions in the left kidney are too small to characterize. No ureteral calculi or hydronephrosis. The bladder is unremarkable. Stomach/Bowel: Small hiatal hernia. The stomach is otherwise within normal limits. Normal appendix. No bowel wall thickening, distention, or surrounding inflammatory changes. Mild colonic diverticulosis. Vascular/Lymphatic:  Minimal aortic atherosclerosis. No abdominal or pelvic lymphadenopathy. Reproductive: Uterus and bilateral adnexa are unremarkable. Other: Tiny fat containing umbilical hernia. No free fluid or pneumoperitoneum. Musculoskeletal: No acute or significant osseous findings. Old bilateral lower rib fractures. Old bilateral inferior pubic rami fractures. Old right parasymphyseal superior pubic ramus fracture. IMPRESSION: 1.  No acute intra-abdominal process. 2. Diffuse hepatic steatosis. 3. Punctate bilateral nonobstructive nephrolithiasis. Electronically Signed   By: Titus Dubin M.D.   On: 04/12/2017 07:12    ____________________________________________   PROCEDURES  Procedure(s) performed: None  Procedures  Critical Care performed: No  ____________________________________________   INITIAL IMPRESSION / ASSESSMENT AND PLAN / ED COURSE  As part of my medical decision making, I reviewed the following data within the [facility]  Notes from prior ED visits and [place] Controlled Substance Database   This is a 52 year old female who comes into the hospital today with some vomiting diarrhea abdominal pain and headache.  The patient woke up with the symptoms.  Her daughter was here in the hospital yesterday and the patient was here with her.  My differential diagnosis includes diverticulitis, colitis, gastroenteritis  I did give the patient a dose of fentanyl as well as some Solu-Medrol for the swelling.  The patient also received a liter of normal saline as well as some Zofran.  The patient did have some blood work which was unremarkable.  Right she needs an MRI the patient also had a troponin and a lipase which was negative and hepatic function panel that was negative.   The patient feels improved but still has some headache. I will give her some toradol, reglan, benadryl and magnesium sulfate. The patient will be discharged to home once she has received her medication and her pain  is improved.         ____________________________________________   FINAL CLINICAL IMPRESSION(S) / ED DIAGNOSES  Final diagnoses:  Acute nonintractable headache, unspecified headache type  Nausea vomiting and diarrhea     ED Discharge Orders        Ordered    ondansetron (ZOFRAN ODT) 4 MG disintegrating tablet  Every 8 hours PRN     04/12/17 0818    butalbital-acetaminophen-caffeine ([place], ESGIC) 50-325-40 MG tablet  Every 6 hours PRN     04/12/17 0818       Note:  This document was prepared using Dragon voice recognition software and may include unintentional dictation errors.    Loney Hering, MD 04/12/17 [phone]

## 2017-04-12 NOTE — ED Notes (Signed)
Patient transported to X-ray at this time 

## 2017-04-12 NOTE — ED Notes (Signed)
Patient's discharge and follow up information reviewed with patient by ED nursing staff and patient given the opportunity to ask questions pertaining to ED visit and discharge plan of care. Patient advised that should symptoms not continue to improve, resolve entirely, or should new symptoms develop then a follow up visit with their PCP or a return visit to the ED may be warranted. Patient verbalized consent and understanding of discharge plan of care including potential need for further evaluation. Patient discharged in stable condition per attending ED physician on duty.   Pt transported to lobby via [place] to wait for ride.

## 2017-04-14 NOTE — Progress Notes (Signed)
GALAXY, BORDEN ([id number]) Visit Report for 04/11/2017 Arrival Information Details Patient Name: [name], [name] Date of Service: 04/11/2017 11:15 AM Medical Record Number: [id number] Patient Account Number: [id number] Date of Birth/Sex: [birth year] (52 y.o. Female) Treating RN: Cornell Barman Primary Care Dagen Beevers: Merrie Roof Other Clinician: Referring Hasheem Voland: Jonna Clark Treating Weda Baumgarner/Extender: Melburn Hake, HOYT Weeks in Treatment: 12 Visit Information History Since Last Visit All ordered tests and consults were completed: Yes Patient Arrived: Ambulatory Added or deleted any medications: No Arrival Time: 11:25 Any new allergies or adverse reactions: No Accompanied By: self Had a fall or experienced change in No Transfer Assistance: None activities of daily living that may affect Patient Identification Verified: Yes risk of falls: Secondary Verification Process Completed: Yes Signs or symptoms of abuse/neglect since last visito No Patient Requires Transmission-Based No Hospitalized since last visit: No Precautions: Has Dressing in Place as Prescribed: Yes Patient Has Alerts: No Pain Present Now: No Electronic Signature(s) Signed: 04/11/2017 11:38:11 AM By: Gretta Cool, BSN, RN, CWS, Kim RN, BSN Entered By: Gretta Cool, BSN, RN, CWS, Kim on 04/11/2017 11:26:22 [name] ([id number]) -------------------------------------------------------------------------------- Encounter Discharge Information Details Patient Name: [name] Date of Service: 04/11/2017 11:15 AM Medical Record Number: [id number] Patient Account Number: [id number] Date of Birth/Sex: 08/16/1965 (52 y.o. Female) Treating RN: Roger Shelter Primary Care Marylin Lathon: Merrie Roof Other Clinician: Referring Raechelle Sarti: Jonna Clark Treating Lajuanda Penick/Extender: Melburn Hake, HOYT Weeks in Treatment: 12 Encounter Discharge Information Items Discharge Pain Level: 0 Discharge Condition: Stable Ambulatory Status:  Ambulatory Discharge Destination: Home Transportation: Other Accompanied By: self Schedule Follow-up Appointment: Yes Medication Reconciliation completed and No provided to Patient/Care Adric Wrede: Provided on Clinical Summary of Care: 04/11/2017 Form Type Recipient Paper Patient DW Electronic Signature(s) Signed: 04/13/2017 10:32:15 AM By: Ruthine Dose Entered By: Ruthine Dose on 04/11/2017 12:03:21 [name] ([id number]) -------------------------------------------------------------------------------- Lower Extremity Assessment Details Patient Name: [name] Date of Service: 04/11/2017 11:15 AM Medical Record Number: [id number] Patient Account Number: [id number] Date of Birth/Sex: 1965/10/02 (52 y.o. Female) Treating RN: Cornell Barman Primary Care Kamill Fulbright: Merrie Roof Other Clinician: Referring Adasha Boehme: Jonna Clark Treating Abisai Coble/Extender: Melburn Hake, HOYT Weeks in Treatment: 12 Vascular Assessment Claudication: Claudication Assessment [Right:Intermittent] Pulses: Dorsalis Pedis Palpable: [Right:Yes] Posterior Tibial Extremity colors, hair growth, and conditions: Extremity Color: [Right:Normal] Hair Growth on Extremity: [Right:No] Temperature of Extremity: [Right:Warm] Capillary Refill: [Right:< 3 seconds] Toe Nail Assessment Left: Right: Thick: Yes Deformed: No Improper Length and Hygiene: No Electronic Signature(s) Signed: 04/11/2017 11:38:11 AM By: Gretta Cool, BSN, RN, CWS, Kim RN, BSN Entered By: Gretta Cool, BSN, RN, CWS, Kim on 04/11/2017 11:33:25 [name], [name] ([id number]) -------------------------------------------------------------------------------- Multi Wound Chart Details Patient Name: [name] Date of Service: 04/11/2017 11:15 AM Medical Record Number: [id number] Patient Account Number: [id number] Date of Birth/Sex: 08-Oct-1965 (52 y.o. Female) Treating RN: Roger Shelter Primary Care Jamillah Camilo: Merrie Roof Other Clinician: Referring Tashala Cumbo:  Jonna Clark Treating Fitzroy Mikami/Extender: Melburn Hake, HOYT Weeks in Treatment: 12 Vital Signs Height(in): 70 Pulse(bpm): 80 Weight(lbs): 225 Blood Pressure(mmHg): 162/120 Body Mass Index(BMI): 32 Temperature(F): 98.4 Respiratory Rate 16 (breaths/min): Photos: [1:No Photos] [N/A:N/A] Wound Location: [1:Right Lower Leg - Anterior] [N/A:N/A] Wounding Event: [1:Trauma] [N/A:N/A] Primary Etiology: [1:Trauma, Other] [N/A:N/A] Comorbid History: [1:Chronic Obstructive Pulmonary Disease (COPD), Sleep Apnea, Arrhythmia, Congestive Heart Failure, Deep Vein Thrombosis, Hypertension, Gout, Rheumatoid Arthritis, Osteoarthritis, Confinement Anxiety] [N/A:N/A] Date Acquired: [1:12/09/2016] [N/A:N/A] Weeks of Treatment: [1:12] [N/A:N/A] Wound Status: [1:Open] [N/A:N/A] Measurements L x W x D [1:2.5x2.3x0.2] [N/A:N/A] (cm) Area (cm) : [1:4.516] [N/A:N/A] Volume (cm) : [1:0.903] [N/A:N/A] % Reduction in Area: [1:-111.40%] [N/A:N/A] % Reduction  in Volume: [1:-40.90%] [N/A:N/A] Classification: [1:Full Thickness Without Exposed Support Structures] [N/A:N/A] Exudate Amount: [1:Large] [N/A:N/A] Exudate Type: [1:Serous] [N/A:N/A] Exudate Color: [1:amber] [N/A:N/A] Wound Margin: [1:Flat and Intact] [N/A:N/A] Granulation Amount: [1:Small (1-33%)] [N/A:N/A] Granulation Quality: [1:Pink] [N/A:N/A] Necrotic Amount: [1:Large (67-100%)] [N/A:N/A] Exposed Structures: [1:Fat Layer (Subcutaneous Tissue) Exposed: Yes Fascia: No Tendon: No Muscle: No] [N/A:N/A] Joint: No Bone: No Epithelialization: None N/A N/A Periwound Skin Texture: Excoriation: No N/A N/A Induration: No Callus: No Crepitus: No Rash: No Scarring: No Periwound Skin Moisture: Maceration: No N/A N/A Dry/Scaly: No Periwound Skin Color: Hemosiderin Staining: Yes N/A N/A Atrophie Blanche: No Cyanosis: No Ecchymosis: No Erythema: No Mottled: No Pallor: No Rubor: No Temperature: No Abnormality N/A N/A Tenderness on Palpation:  Yes N/A N/A Wound Preparation: Ulcer Cleansing: N/A N/A Rinsed/Irrigated with Saline Topical Anesthetic Applied: Other: lidocaine 4% Treatment Notes Electronic Signature(s) Signed: 04/11/2017 4:59:35 PM By: Roger Shelter Entered By: Roger Shelter on 04/11/2017 11:56:37 [name], [name] ([id number]) -------------------------------------------------------------------------------- [place] Details Patient Name: [name] Date of Service: 04/11/2017 11:15 AM Medical Record Number: [id number] Patient Account Number: [id number] Date of Birth/Sex: November 22, 1965 (52 y.o. Female) Treating RN: Roger Shelter Primary Care Carnella Fryman: Merrie Roof Other Clinician: Referring Christon Parada: Jonna Clark Treating Marishka Rentfrow/Extender: Melburn Hake, HOYT Weeks in Treatment: 12 Active Inactive ` Orientation to the Wound Care Program Nursing Diagnoses: Knowledge deficit related to the wound healing center program Goals: Patient/caregiver will verbalize understanding of the [place] Program Date Initiated: 01/12/2017 Target Resolution Date: 02/13/2016 Goal Status: Active Interventions: Provide education on orientation to the wound center Notes: ` Wound/Skin Impairment Nursing Diagnoses: Impaired tissue integrity Knowledge deficit related to ulceration/compromised skin integrity Goals: Patient/caregiver will verbalize understanding of skin care regimen Date Initiated: 01/12/2017 Target Resolution Date: 02/12/2017 Goal Status: Active Ulcer/skin breakdown will have a volume reduction of 30% by week 4 Date Initiated: 01/12/2017 Target Resolution Date: 02/12/2017 Goal Status: Active Interventions: Assess patient/caregiver ability to obtain necessary supplies Assess ulceration(s) every visit Provide education on ulcer and skin care Treatment Activities: Skin care regimen initiated : 01/12/2017 Notes: Electronic Signature(s) Signed: 04/11/2017 4:59:35 PM By: Viona Gilmore, [name] ([id number]) Entered By: Roger Shelter on 04/11/2017 11:56:28 [name] ([id number]) -------------------------------------------------------------------------------- Pain Assessment Details Patient Name: [name] Date of Service: 04/11/2017 11:15 AM Medical Record Number: [id number] Patient Account Number: [id number] Date of Birth/Sex: March 07, 1965 (52 y.o. Female) Treating RN: Cornell Barman Primary Care Odesser Tourangeau: Merrie Roof Other Clinician: Referring Niko Penson: Jonna Clark Treating Ashanna Heinsohn/Extender: Melburn Hake, HOYT Weeks in Treatment: 12 Active Problems Location of Pain Severity and Description of Pain Patient Has Paino No Site Locations With Dressing Change: No Pain Management and Medication Current Pain Management: Goals for Pain Management Topical or injectable lidocaine is offered to patient for acute pain when surgical debridement is performed. If needed, Patient is instructed to use over the counter pain medication for the following 24-48 hours after debridement. Wound care MDs do not prescribed pain medications. Patient has chronic pain or uncontrolled pain. Patient has been instructed to make an appointment with their Primary Care Physician for pain management. Electronic Signature(s) Signed: 04/11/2017 11:38:11 AM By: Gretta Cool, BSN, RN, CWS, Kim RN, BSN Entered By: Gretta Cool, BSN, RN, CWS, Kim on 04/11/2017 11:26:38 [name] ([id number]) -------------------------------------------------------------------------------- Patient/Caregiver Education Details Patient Name: [name] Date of Service: 04/11/2017 11:15 AM Medical Record Number: [id number] Patient Account Number: [id number] Date of Birth/Gender: [birth year] (52 y.o. Female) Treating RN: Roger Shelter Primary Care Physician: Merrie Roof Other Clinician: Referring Physician: Jonna Clark Treating Physician/Extender: Joaquim Lai  III, HOYT Weeks in Treatment: 12 Education  Assessment Education Provided To: Patient Education Topics Provided Wound Debridement: Handouts: Wound Debridement Methods: Explain/Verbal Responses: State content correctly Wound/Skin Impairment: Handouts: Caring for Your Ulcer Methods: Explain/Verbal Responses: State content correctly Electronic Signature(s) Signed: 04/11/2017 4:59:35 PM By: Roger Shelter Entered By: Roger Shelter on 04/11/2017 12:01:00 [name], [name] ([id number]) -------------------------------------------------------------------------------- Wound Assessment Details Patient Name: [name] Date of Service: 04/11/2017 11:15 AM Medical Record Number: [id number] Patient Account Number: [id number] Date of Birth/Sex: [birth year] (52 y.o. Female) Treating RN: Cornell Barman Primary Care Ellisa Devivo: Merrie Roof Other Clinician: Referring Gerre Ranum: Jonna Clark Treating Alic Hilburn/Extender: Melburn Hake, HOYT Weeks in Treatment: 12 Wound Status Wound Number: 1 Primary Trauma, Other Etiology: Wound Location: Right Lower Leg - Anterior Wound Open Wounding Event: Trauma Status: Date Acquired: 12/09/2016 Comorbid Chronic Obstructive Pulmonary Disease (COPD), Weeks Of Treatment: 12 History: Sleep Apnea, Arrhythmia, Congestive Heart Clustered Wound: No Failure, Deep Vein Thrombosis, Hypertension, Gout, Rheumatoid Arthritis, Osteoarthritis, Confinement Anxiety Photos Photo Uploaded By: Gretta Cool, BSN, RN, CWS, Kim on 04/11/2017 13:00:21 Wound Measurements Length: (cm) 2.5 Width: (cm) 2.3 Depth: (cm) 0.2 Area: (cm) 4.516 Volume: (cm) 0.903 % Reduction in Area: -111.4% % Reduction in Volume: -40.9% Epithelialization: None Tunneling: No Undermining: No Wound Description Full Thickness Without Exposed Support Foul Odor Classification: Structures Slough/Fi Wound Margin: Flat and Intact Exudate Large Amount: Exudate Type: Serous Exudate Color: amber After Cleansing: No brino Yes Wound Bed Granulation  Amount: Small (1-33%) Exposed Structure Granulation Quality: Pink Fascia Exposed: No Necrotic Amount: Large (67-100%) Fat Layer (Subcutaneous Tissue) Exposed: Yes Necrotic Quality: Adherent Slough Tendon Exposed: No Muscle Exposed: No Joint Exposed: No Bone Exposed: No [name], [name] ([id number]) Periwound Skin Texture Texture Color No Abnormalities Noted: No No Abnormalities Noted: No Callus: No Atrophie Blanche: No Crepitus: No Cyanosis: No Excoriation: No Ecchymosis: No Induration: No Erythema: No Rash: No Hemosiderin Staining: Yes Scarring: No Mottled: No Pallor: No Moisture Rubor: No No Abnormalities Noted: No Dry / Scaly: No Temperature / Pain Maceration: No Temperature: No Abnormality Tenderness on Palpation: Yes Wound Preparation Ulcer Cleansing: Rinsed/Irrigated with Saline Topical Anesthetic Applied: Other: lidocaine 4%, Treatment Notes Wound #1 (Right, Anterior Lower Leg) 1. Cleansed with: Clean wound with Normal Saline 2. Anesthetic Topical Lidocaine 4% cream to wound bed prior to debridement 4. Dressing Applied: Hydrafera Blue 5. Secondary Dressing Applied Bordered Foam Dressing Electronic Signature(s) Signed: 04/11/2017 11:38:11 AM By: Gretta Cool, BSN, RN, CWS, Kim RN, BSN Entered By: Gretta Cool, BSN, RN, CWS, Kim on 04/11/2017 11:32:22 [name], [name] ([id number]) -------------------------------------------------------------------------------- Vitals Details Patient Name: [name] Date of Service: 04/11/2017 11:15 AM Medical Record Number: [id number] Patient Account Number: [id number] Date of Birth/Sex: [birth year] (52 y.o. Female) Treating RN: Cornell Barman Primary Care Emmauel Hallums: Merrie Roof Other Clinician: Referring Lonzo Saulter: Jonna Clark Treating Bernd Crom/Extender: Melburn Hake, HOYT Weeks in Treatment: 12 Vital Signs Time Taken: 11:26 Temperature (F): 98.4 Height (in): 70 Pulse (bpm): 80 Weight (lbs): 225 Respiratory Rate (breaths/min):  16 Body Mass Index (BMI): 32.3 Blood Pressure (mmHg): 162/120 Reference Range: 80 - 120 mg / dl Notes Patient went to PCP on Friday regarding elevated BP. PCP increased medication, patient states she had not taken medication today and will leave here and pick up new dosage. Patient to check BP when home 1 hour after taking medication and call PCP if it is still elevated. Electronic Signature(s) Signed: 04/11/2017 11:37:53 AM By: Gretta Cool, BSN, RN, CWS, Kim RN, BSN Entered By: Gretta Cool, BSN, RN, CWS, Kim on 04/11/2017 11:37:53

## 2017-04-18 ENCOUNTER — Ambulatory Visit: Payer: Medicaid Other | Admitting: Physician Assistant

## 2017-04-19 ENCOUNTER — Telehealth: Payer: Self-pay | Admitting: Family Medicine

## 2017-04-19 MED ORDER — COLCHICINE 0.6 MG PO TABS: 0.6000 mg | ORAL_TABLET | Freq: Two times a day (BID) | ORAL | 1 refills | Status: DC

## 2017-04-19 NOTE — Telephone Encounter (Signed)
Sent in some colchicine - will recheck levels at upcoming OV and decide on long-term management

## 2017-04-19 NOTE — Telephone Encounter (Signed)
Tried calling patient to let her know what [name] said. Patient did not answer and no VM was set up. Will try to call again later. OK for PEC to let patient know what [name] said if she returns the call in the meantime.

## 2017-04-19 NOTE — Telephone Encounter (Signed)
Routing to provider to advise.  

## 2017-04-19 NOTE — Telephone Encounter (Signed)
Copied from [facility] [id number]. Topic: General - Other >> Apr 19, 2017  9:14 AM Cecelia Byars, NT wrote: Reason for CRM: Patient would like a new prescription called in for gout , she would like it called in to [id number] - [place], [address] [phone] (Phone) [phone] (Fax , or please call her if this can't be done or if she needs to be seen before this is done her number is [phone] ,thamks

## 2017-04-20 NOTE — Telephone Encounter (Signed)
Tried calling patient. Still no answer and no VM set up. Will try to call again later.

## 2017-04-25 ENCOUNTER — Encounter: Payer: Medicaid Other | Attending: Physician Assistant | Admitting: Physician Assistant

## 2017-04-25 DIAGNOSIS — I4891 Unspecified atrial fibrillation: Secondary | ICD-10-CM | POA: Diagnosis not present

## 2017-04-25 DIAGNOSIS — F419 Anxiety disorder, unspecified: Secondary | ICD-10-CM | POA: Insufficient documentation

## 2017-04-25 DIAGNOSIS — M069 Rheumatoid arthritis, unspecified: Secondary | ICD-10-CM | POA: Diagnosis not present

## 2017-04-25 DIAGNOSIS — I509 Heart failure, unspecified: Secondary | ICD-10-CM | POA: Insufficient documentation

## 2017-04-25 DIAGNOSIS — Z8673 Personal history of transient ischemic attack (TIA), and cerebral infarction without residual deficits: Secondary | ICD-10-CM | POA: Insufficient documentation

## 2017-04-25 DIAGNOSIS — M109 Gout, unspecified: Secondary | ICD-10-CM | POA: Diagnosis not present

## 2017-04-25 DIAGNOSIS — G4730 Sleep apnea, unspecified: Secondary | ICD-10-CM | POA: Diagnosis not present

## 2017-04-25 DIAGNOSIS — F329 Major depressive disorder, single episode, unspecified: Secondary | ICD-10-CM | POA: Insufficient documentation

## 2017-04-25 DIAGNOSIS — I110 Hypertensive heart disease with heart failure: Secondary | ICD-10-CM | POA: Insufficient documentation

## 2017-04-25 DIAGNOSIS — L03115 Cellulitis of right lower limb: Secondary | ICD-10-CM | POA: Diagnosis not present

## 2017-04-25 DIAGNOSIS — L97212 Non-pressure chronic ulcer of right calf with fat layer exposed: Secondary | ICD-10-CM | POA: Diagnosis present

## 2017-04-25 DIAGNOSIS — Z87891 Personal history of nicotine dependence: Secondary | ICD-10-CM | POA: Insufficient documentation

## 2017-04-25 DIAGNOSIS — I739 Peripheral vascular disease, unspecified: Secondary | ICD-10-CM | POA: Diagnosis not present

## 2017-04-25 DIAGNOSIS — Z86718 Personal history of other venous thrombosis and embolism: Secondary | ICD-10-CM | POA: Insufficient documentation

## 2017-04-25 DIAGNOSIS — I87311 Chronic venous hypertension (idiopathic) with ulcer of right lower extremity: Secondary | ICD-10-CM | POA: Diagnosis not present

## 2017-04-25 DIAGNOSIS — J449 Chronic obstructive pulmonary disease, unspecified: Secondary | ICD-10-CM | POA: Diagnosis not present

## 2017-04-26 NOTE — Progress Notes (Signed)
[name], [name] ([id number]) Visit Report for 04/25/2017 Chief Complaint Document Details Patient Name: [name], [name] Date of Service: 04/25/2017 11:15 AM Medical Record Number: [id number] Patient Account Number: [id number] Date of Birth/Sex: October 09, 1965 (52 y.o. F) Treating RN: Roger Shelter Primary Care Provider: Merrie Roof Other Clinician: Referring Provider: Merrie Roof Treating Provider/Extender: Melburn Hake, Maiah Sinning Weeks in Treatment: 14 Information Obtained from: Patient Chief Complaint Right LE Ulcer Electronic Signature(s) Signed: 04/26/2017 8:40:52 AM By: Worthy Keeler PA-C Entered By: Worthy Keeler on 04/25/2017 11:26:18 [place], [name] ([id number]) -------------------------------------------------------------------------------- Debridement Details Patient Name: [name] Date of Service: 04/25/2017 11:15 AM Medical Record Number: [id number] Patient Account Number: [id number] Date of Birth/Sex: Jan 16, 1966 (52 y.o. F) Treating RN: Roger Shelter Primary Care Provider: Merrie Roof Other Clinician: Referring Provider: Merrie Roof Treating Provider/Extender: Melburn Hake, Synthia Fairbank Weeks in Treatment: 14 Debridement Performed for Wound #1 Right,Anterior Lower Leg Assessment: Performed By: Physician STONE III, Elta Angell E., PA-C Debridement Type: Debridement Pre-procedure Verification/Time Yes - 11:54 Out Taken: Start Time: 11:54 Pain Control: Other : lidocaine 4% Total Area Debrided (L x W): 2.3 (cm) x 1.7 (cm) = 3.91 (cm) Tissue and other material Viable, Non-Viable, Slough, Subcutaneous, Biofilm, Slough debrided: Level: Skin/Subcutaneous Tissue Debridement Description: Excisional Instrument: Curette Bleeding: Minimum Hemostasis Achieved: Pressure End Time: 11:55 Procedural Pain: 0 Post Procedural Pain: 0 Response to Treatment: Procedure was tolerated well Post Debridement Measurements of Total Wound Length: (cm) 2.3 Width: (cm) 1.7 Depth: (cm) 0.3 Volume: (cm)  0.921 Character of Wound/Ulcer Post Debridement: Stable Post Procedure Diagnosis Same as Pre-procedure Electronic Signature(s) Signed: 04/25/2017 5:00:26 PM By: Roger Shelter Signed: 04/26/2017 8:40:52 AM By: Worthy Keeler PA-C Entered By: Roger Shelter on 04/25/2017 11:55:50 [place], [name] ([id number]) -------------------------------------------------------------------------------- HPI Details Patient Name: [name] Date of Service: 04/25/2017 11:15 AM Medical Record Number: [id number] Patient Account Number: [id number] Date of Birth/Sex: 01/22/1966 (52 y.o. F) Treating RN: Roger Shelter Primary Care Provider: Merrie Roof Other Clinician: Referring Provider: Merrie Roof Treating Provider/Extender: Melburn Hake, Jazzmon Prindle Weeks in Treatment: 14 History of Present Illness HPI Description: 01/12/17; this is a 52 year old nondiabetic remote ex-smoker. She tells me that sometime in early November she developed a sudden painful area on her right medial lower leg while she was in bed at night she reached down and scratch the area and woke up in the morning with a scab small wound. This gradually got worse over time. She was seen in the ER on 12/18/16. At that point the possibility of an insect bite/brown recluse spider bite was brought up. At that point it was 1 cm eschar covered wound. She was treated with Bactrim DS. She was back in the ER on 01/09/17 with increasing size pain of the wound. At this point the patient states it was covered with a black eschar at surface. An x-ray showed punctate radio density material within the wound but no osteomyelitis. 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 She was put on clindamycin. The patient was seen on 12/7 by vascular surgery. At that point noting a 3 x 3 cm necrotic wound. ABIs were ordered. She was treated with Silvadene cream. I don't see the results of the ABIs at this point. She is using Silvadene cream. A comprehensive metabolic  panel CBC and differential PT and PTT were done which were also normal. 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 COPD/asthma/depression and a history of atrial fib. She tells me she had a history of vein stripping many years ago ABIs  in our clinic were noncompressible on the right at 1.59 01/19/17; right medial lower leg wound. Small wound with some depth. Still requiring debridement we are using Santyl. She has vascular studies on January 9 but at this point I can't tell whether these are arterial venous or both. They are at [facility] 01/27/16; right medial lower leg wound. She has arterial and venous studies later this month. The wound looks somewhat better after debridement. We've been using Santyl. 02/08/17 on evaluation today patient has had for arterial studies performed. This showed noncompressible measurements in regard to the ABI although she did have tried phasic flow and it stated in the report that she did have good TBI's. With that being said I see no documentation of TBI's and I cannot find the final report in epic only see what the patient brought in to show me today. We obviously are going to need to contact the office to see about getting the final report. We have been using Santyl and this does seem to be helping the wound to a degree. Patient is having no significant pain. 03/21/17 on evaluation today patient appears to be a little bit larger in regard to her ulcer. It has actually been almost a month since we have last seen her. She states that she has continued to use the [place] during that time. With that being said she is noting that she does have "pus" noted on the surface of the wound. Upon further inspection it actually appears that this is more slough and not pass noted at this point. There is no erythema surrounding the wound and nothing that would suggest that she is having an infection at this point. Her blood pressure is  significantly high at this time. She states that she is just about out of blood pressure medication obviously we did tell her we cannot prescribed this needs to see her primary care provider as soon as possible. 04/04/17 on evaluation today patient states that over the past week she's been having a lot of significant burning with her dressings. We did switch her to the Iodoflex recently and I believe this may be the reason that she's been experiencing the burning type sensations. Nonetheless she really did not tell me about this as much last week and even over the past week she did not call list in order to tell us that she was having any issues. Nonetheless the wound does appear to be better. 04/11/17 on evaluation today patient actually appears to be doing fairly well in regard to her ulcer. There is some Slough noted on the surface of the wound but no evidence of infection, erythema, or significant pain. She does tell us that she received a new blood pressure medication from her primary care physician on Friday however she has not picked that up as of yet. She is actually going to pick it up when she leaves here today. Nonetheless her blood pressure was significantly high unfortunately [place], [place] ([id number]) today. The good news is her dressing does not seem to be causing her any difficulties like it did with the Iodoflex. 04/25/17 on evaluation today patient appears to be doing very well in regard to her right lower extremity ulcer. She has been tolerating the dressing changes without complication. At this point she states that she is having no significant pain in regard to the ulcer. With that being said she does have some issues with her left first metatarsal distally where she recently has been  dealing with gout and she also recently had the flu she was in the hospital for these two issues. Fortunately both appear to be doing much better at this point. Again her wound is showing signs of  improvement compared to previous weeks measurements. Electronic Signature(s) Signed: 04/26/2017 8:40:52 AM By: Worthy Keeler PA-C Entered By: Worthy Keeler on 04/25/2017 13:03:58 [place], [name] ([id number]) -------------------------------------------------------------------------------- Physical Exam Details Patient Name: [name] Date of Service: 04/25/2017 11:15 AM Medical Record Number: [id number] Patient Account Number: [id number] Date of Birth/Sex: [birth year] (52 y.o. F) Treating RN: Roger Shelter Primary Care Provider: Merrie Roof Other Clinician: Referring Provider: Merrie Roof Treating Provider/Extender: STONE III, Fatmata Legere Weeks in Treatment: 38 Constitutional Well-nourished and well-hydrated in no acute distress. Respiratory normal breathing without difficulty. Psychiatric this patient is able to make decisions and demonstrates good insight into disease process. Alert and Oriented x 3. pleasant and cooperative. Notes Patient's wound did require sharp debridement today to remove biofilm, Slough, and subcutaneous tissue. She tolerated this well today without complication and post debridement the wound bed appear to be doing excellent. There is no evidence of infection. Electronic Signature(s) Signed: 04/26/2017 8:40:52 AM By: Worthy Keeler PA-C Entered By: Worthy Keeler on 04/25/2017 13:04:38 [place], [name] ([id number]) -------------------------------------------------------------------------------- Physician Orders Details Patient Name: [name] Date of Service: 04/25/2017 11:15 AM Medical Record Number: [id number] Patient Account Number: [id number] Date of Birth/Sex: 08-27-1965 (52 y.o. F) Treating RN: Roger Shelter Primary Care Provider: Merrie Roof Other Clinician: Referring Provider: Merrie Roof Treating Provider/Extender: Melburn Hake, Audryanna Zurita Weeks in Treatment: 16 Verbal / Phone Orders: No Diagnosis Coding ICD-10 Coding Code Description [phone]  Non-pressure chronic ulcer of right calf with fat layer exposed I87.331 Chronic venous hypertension (idiopathic) with ulcer and inflammation of right lower extremity L03.115 Cellulitis of right lower limb Wound Cleansing Wound #1 Right,Anterior Lower Leg o Clean wound with Normal Saline. Anesthetic (add to Medication List) Wound #1 Right,Anterior Lower Leg o Topical Lidocaine 4% cream applied to wound bed prior to debridement (In Clinic Only). Primary Wound Dressing Wound #1 Right,Anterior Lower Leg o Hydrafera Blue Ready Transfer Secondary Dressing Wound #1 Right,Anterior Lower Leg o Boardered Foam Dressing Dressing Change Frequency Wound #1 Right,Anterior Lower Leg o Change dressing every other day. Follow-up Appointments o Return Appointment in 1 week. Additional Orders / Instructions o Vitamin A; Vitamin C, Zinc o Increase protein intake. Electronic Signature(s) Signed: 04/25/2017 5:00:26 PM By: Roger Shelter Signed: 04/26/2017 8:40:52 AM By: Worthy Keeler PA-C Entered By: Roger Shelter on 04/25/2017 11:56:33 [place], [name] ([id number]) -------------------------------------------------------------------------------- Problem List Details Patient Name: [name] Date of Service: 04/25/2017 11:15 AM Medical Record Number: [id number] Patient Account Number: [id number] Date of Birth/Sex: Apr 05, 1965 (52 y.o. F) Treating RN: Roger Shelter Primary Care Provider: Merrie Roof Other Clinician: Referring Provider: Merrie Roof Treating Provider/Extender: Melburn Hake, Jefferson Fullam Weeks in Treatment: 14 Active Problems ICD-10 Impacting Encounter Code Description Active Date Wound Healing Diagnosis L97.212 Non-pressure chronic ulcer of right calf with fat layer exposed 01/12/2017 Yes I87.331 Chronic venous hypertension (idiopathic) with ulcer and 01/12/2017 Yes inflammation of right lower extremity L03.115 Cellulitis of right lower limb 01/12/2017 Yes Inactive  Problems Resolved Problems Electronic Signature(s) Signed: 04/26/2017 8:40:52 AM By: Worthy Keeler PA-C Entered By: Worthy Keeler on 04/25/2017 11:26:11 [place], [name] ([id number]) -------------------------------------------------------------------------------- Progress Note Details Patient Name: [name] Date of Service: 04/25/2017 11:15 AM Medical Record Number: [id number] Patient Account Number: [id number] Date of Birth/Sex: 10-29-1965 (52 y.o. F) Treating RN: Roger Shelter Primary Care  Provider: Merrie Roof Other Clinician: Referring Provider: Merrie Roof Treating Provider/Extender: Melburn Hake, Zahriah Roes Weeks in Treatment: 14 Subjective Chief Complaint Information obtained from Patient Right LE Ulcer History of Present Illness (HPI) 01/12/17; this is a 52 year old nondiabetic remote ex-smoker. She tells me that sometime in early November she developed a sudden painful area on her right medial lower leg while she was in bed at night she reached down and scratch the area and woke up in the morning with a scab small wound. This gradually got worse over time. She was seen in the ER on 12/18/16. At that point the possibility of an insect bite/brown recluse spider bite was brought up. At that point it was 1 cm eschar covered wound. She was treated with Bactrim DS. She was back in the ER on 01/09/17 with increasing size pain of the wound. At this point the patient states it was covered with a black eschar at surface. An x-ray showed punctate radio density material within the wound but no osteomyelitis. 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 She was put on clindamycin. The patient was seen on 12/7 by vascular surgery. At that point noting a 3 x 3 cm necrotic wound. ABIs were ordered. She was treated with Silvadene cream. I don't see the results of the ABIs at this point. She is using Silvadene cream. A comprehensive metabolic panel CBC and differential PT and PTT were done  which were also normal. 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 COPD/asthma/depression and a history of atrial fib. She tells me she had a history of vein stripping many years ago ABIs in our clinic were noncompressible on the right at 1.59 01/19/17; right medial lower leg wound. Small wound with some depth. Still requiring debridement we are using Santyl. She has vascular studies on January 9 but at this point I can't tell whether these are arterial venous or both. They are at [facility] 01/27/16; right medial lower leg wound. She has arterial and venous studies later this month. The wound looks somewhat better after debridement. We've been using Santyl. 02/08/17 on evaluation today patient has had for arterial studies performed. This showed noncompressible measurements in regard to the ABI although she did have tried phasic flow and it stated in the report that she did have good TBI's. With that being said I see no documentation of TBI's and I cannot find the final report in epic only see what the patient brought in to show me today. We obviously are going to need to contact the office to see about getting the final report. We have been using Santyl and this does seem to be helping the wound to a degree. Patient is having no significant pain. 03/21/17 on evaluation today patient appears to be a little bit larger in regard to her ulcer. It has actually been almost a month since we have last seen her. She states that she has continued to use the [place] during that time. With that being said she is noting that she does have "pus" noted on the surface of the wound. Upon further inspection it actually appears that this is more slough and not pass noted at this point. There is no erythema surrounding the wound and nothing that would suggest that she is having an infection at this point. Her blood pressure is significantly high at this time. She states  that she is just about out of blood pressure medication obviously we did tell  her we cannot prescribed this needs to see her primary care provider as soon as possible. 04/04/17 on evaluation today patient states that over the past week she's been having a lot of significant burning with her dressings. We did switch her to the Iodoflex recently and I believe this may be the reason that she's been experiencing the burning type sensations. Nonetheless she really did not tell me about this as much last week and even over the past week she did not call list in order to tell us that she was having any issues. Nonetheless the wound does appear to be better. [name], [name] ([id number]) 04/11/17 on evaluation today patient actually appears to be doing fairly well in regard to her ulcer. There is some Slough noted on the surface of the wound but no evidence of infection, erythema, or significant pain. She does tell us that she received a new blood pressure medication from her primary care physician on Friday however she has not picked that up as of yet. She is actually going to pick it up when she leaves here today. Nonetheless her blood pressure was significantly high unfortunately today. The good news is her dressing does not seem to be causing her any difficulties like it did with the Iodoflex. 04/25/17 on evaluation today patient appears to be doing very well in regard to her right lower extremity ulcer. She has been tolerating the dressing changes without complication. At this point she states that she is having no significant pain in regard to the ulcer. With that being said she does have some issues with her left first metatarsal distally where she recently has been dealing with gout and she also recently had the flu she was in the hospital for these two issues. Fortunately both appear to be doing much better at this point. Again her wound is showing signs of improvement compared to previous  weeks measurements. Patient History Information obtained from Patient. Family History Cancer - Siblings, No family history of Diabetes, Heart Disease, Hypertension, Kidney Disease, Lung Disease, Seizures, Stroke, Thyroid Problems, Tuberculosis. Social History Former smoker - stopped 6 years ago, Marital Status - Separated, Alcohol Use - Rarely, Drug Use - No History, Caffeine Use - Moderate. Review of Systems (ROS) Constitutional Symptoms (General Health) Denies complaints or symptoms of Fever, Chills. Respiratory The patient has no complaints or symptoms. Cardiovascular The patient has no complaints or symptoms. Psychiatric The patient has no complaints or symptoms. Objective Constitutional Well-nourished and well-hydrated in no acute distress. Vitals Time Taken: 11:31 AM, Height: 70 in, Weight: 225 lbs, BMI: 32.3, Temperature: 97.8 F, Pulse: 82 bpm, Respiratory Rate: 18 breaths/min, Blood Pressure: 120/88 mmHg. Respiratory normal breathing without difficulty. Psychiatric this patient is able to make decisions and demonstrates good insight into disease process. Alert and Oriented x 3. pleasant [name], [name] ([id number]) and cooperative. General Notes: Patient's wound did require sharp debridement today to remove biofilm, Slough, and subcutaneous tissue. She tolerated this well today without complication and post debridement the wound bed appear to be doing excellent. There is no evidence of infection. Integumentary (Hair, Skin) Wound #1 status is Open. Original cause of wound was Trauma. The wound is located on the Right,Anterior Lower Leg. The wound measures 2.3cm length x 1.7cm width x 0.2cm depth; 3.071cm^2 area and 0.614cm^3 volume. There is Fat Layer (Subcutaneous Tissue) Exposed exposed. There is no tunneling or undermining noted. There is a large amount of serous drainage noted. The wound margin is flat and intact. There is large (67-100%)  pink granulation within the  wound bed. There is a small (1-33%) amount of necrotic tissue within the wound bed including Adherent Slough. The periwound skin appearance exhibited: Hemosiderin Staining. The periwound skin appearance did not exhibit: Callus, Crepitus, Excoriation, Induration, Rash, Scarring, Dry/Scaly, Maceration, Atrophie Blanche, Cyanosis, Ecchymosis, Mottled, Pallor, Rubor, Erythema. Periwound temperature was noted as No Abnormality. The periwound has tenderness on palpation. Assessment Active Problems ICD-10 L97.212 - Non-pressure chronic ulcer of right calf with fat layer exposed I87.331 - Chronic venous hypertension (idiopathic) with ulcer and inflammation of right lower extremity L03.115 - Cellulitis of right lower limb Procedures Wound #1 Pre-procedure diagnosis of Wound #1 is a Trauma, Other located on the Right,Anterior Lower Leg . There was a Excisional Skin/Subcutaneous Tissue Debridement with a total area of 3.91 sq cm performed by STONE III, Dareld Mcauliffe E., PA-C. With the following instrument(s): Curette. to remove Viable and Non-Viable tissue/material Material removed includes Subcutaneous Tissue, and Slough, Biofilm, and Slough after achieving pain control using Other (lidocaine 4%). No specimens were taken. A time out was conducted at 11:54, prior to the start of the procedure. A Minimum amount of bleeding was controlled with Pressure. The procedure was tolerated well with a pain level of 0 throughout and a pain level of 0 following the procedure. Post Debridement Measurements: 2.3cm length x 1.7cm width x 0.3cm depth; 0.921cm^3 volume. Character of Wound/Ulcer Post Debridement is stable. Post procedure Diagnosis Wound #1: Same as Pre-Procedure Plan Wound Cleansing: Wound #1 Right,Anterior Lower Leg: Clean wound with Normal Saline. Anesthetic (add to Medication List): [name], [name] ([id number]) Wound #1 Right,Anterior Lower Leg: Topical Lidocaine 4% cream applied to wound bed prior to  debridement (In Clinic Only). Primary Wound Dressing: Wound #1 Right,Anterior Lower Leg: Hydrafera Blue Ready Transfer Secondary Dressing: Wound #1 Right,Anterior Lower Leg: Boardered Foam Dressing Dressing Change Frequency: Wound #1 Right,Anterior Lower Leg: Change dressing every other day. Follow-up Appointments: Return Appointment in 1 week. Additional Orders / Instructions: Vitamin A; Vitamin C, Zinc Increase protein intake. I am going to recommend at this point that we continue with the Current wound care measures for the next week. I'm pleased that she's making continual progress and I hope is that this will continue to progress in that regard. Patient is in agreement with plan. Please see above for specific wound care orders. We will see patient for re-evaluation in 1 week(s) here in the clinic. If anything worsens or changes patient will contact our office for additional recommendations. Electronic Signature(s) Signed: 04/26/2017 8:40:52 AM By: Worthy Keeler PA-C Entered By: Worthy Keeler on 04/25/2017 13:05:03 [place], [name] ([id number]) -------------------------------------------------------------------------------- ROS/PFSH Details Patient Name: [name] Date of Service: 04/25/2017 11:15 AM Medical Record Number: [id number] Patient Account Number: [id number] Date of Birth/Sex: [birth year] (52 y.o. F) Treating RN: Roger Shelter Primary Care Provider: Merrie Roof Other Clinician: Referring Provider: Merrie Roof Treating Provider/Extender: Melburn Hake, Hannah Crill Weeks in Treatment: 14 Information Obtained From Patient Wound History Do you currently have one or more open woundso Yes How many open wounds do you currently haveo 1 Approximately how long have you had your woundso one month How have you been treating your wound(s) until nowo 3 weeks Has your wound(s) ever healed and then re-openedo Yes Have you had any lab work done in the past montho Yes Have you tested  positive for an antibiotic resistant organism (MRSA, VRE)o No Have you tested positive for osteomyelitis (bone infection)o No Have you had any tests for circulation on your legso Yes Where  was the test doneo [facility] Have you had other problems associated with your woundso Infection Constitutional Symptoms (General Health) Complaints and Symptoms: Negative for: Fever; Chills Eyes Medical History: Negative for: Cataracts; Glaucoma; Optic Neuritis Ear/Nose/Mouth/Throat Medical History: Negative for: Chronic sinus problems/congestion; Middle ear problems Hematologic/Lymphatic Medical History: Negative for: Anemia; Hemophilia; Human Immunodeficiency Virus; Lymphedema; Sickle Cell Disease Respiratory Complaints and Symptoms: No Complaints or Symptoms Medical History: Positive for: Chronic Obstructive Pulmonary Disease (COPD); Sleep Apnea Negative for: Aspiration; Asthma; Pneumothorax; Tuberculosis Cardiovascular Complaints and Symptoms: No Complaints or Symptoms [name], [name] ([id number]) Medical History: Positive for: Arrhythmia; Congestive Heart Failure; Deep Vein Thrombosis - in lower legs and history of varicose veins; Hypertension Negative for: Angina; Coronary Artery Disease; Hypotension; Myocardial Infarction; Peripheral Arterial Disease; Peripheral Venous Disease; Phlebitis; Vasculitis Gastrointestinal Medical History: Negative for: Cirrhosis ; Colitis; Crohnos; Hepatitis A; Hepatitis B; Hepatitis C Endocrine Medical History: Negative for: Type I Diabetes; Type II Diabetes Genitourinary Medical History: Negative for: End Stage Renal Disease Immunological Medical History: Negative for: Lupus Erythematosus; Raynaudos; Scleroderma Integumentary (Skin) Medical History: Negative for: History of Burn; History of pressure wounds Musculoskeletal Medical History: Positive for: Gout; Rheumatoid Arthritis; Osteoarthritis Negative for: Osteomyelitis Neurologic Medical  History: Negative for: Dementia; Neuropathy; Quadriplegia; Paraplegia; Seizure Disorder Oncologic Medical History: Negative for: Received Chemotherapy; Received Radiation Psychiatric Complaints and Symptoms: No Complaints or Symptoms Medical History: Positive for: Confinement Anxiety Negative for: Anorexia/bulimia Immunizations Pneumococcal Vaccine: Received Pneumococcal Vaccination: No [name], [name] ([id number]) Implantable Devices Family and Social History Cancer: Yes - Siblings; Diabetes: No; Heart Disease: No; Hypertension: No; Kidney Disease: No; Lung Disease: No; Seizures: No; Stroke: No; Thyroid Problems: No; Tuberculosis: No; Former smoker - stopped 6 years ago; Marital Status - Separated; Alcohol Use: Rarely; Drug Use: No History; Caffeine Use: Moderate; Financial Concerns: No; Food, Clothing or Shelter Needs: No; Support System Lacking: No; Transportation Concerns: No; Advanced Directives: No; Patient does not want information on Advanced Directives; Do not resuscitate: No; Living Will: No; Medical Power of Attorney: No Physician Affirmation I have reviewed and agree with the above information. Electronic Signature(s) Signed: 04/25/2017 5:00:26 PM By: Roger Shelter Signed: 04/26/2017 8:40:52 AM By: Worthy Keeler PA-C Entered By: Worthy Keeler on 04/25/2017 13:04:18 [place], [name] ([id number]) -------------------------------------------------------------------------------- SuperBill Details Patient Name: [name] Date of Service: 04/25/2017 Medical Record Number: [id number] Patient Account Number: [id number] Date of Birth/Sex: [birth year] (52 y.o. F) Treating RN: Roger Shelter Primary Care Provider: Merrie Roof Other Clinician: Referring Provider: Merrie Roof Treating Provider/Extender: Melburn Hake, Trisa Cranor Weeks in Treatment: 14 Diagnosis Coding ICD-10 Codes Code Description 9478247774 Non-pressure chronic ulcer of right calf with fat layer exposed I87.331  Chronic venous hypertension (idiopathic) with ulcer and inflammation of right lower extremity L03.115 Cellulitis of right lower limb Facility Procedures CPT4 Code: 02774128 Description: 78676 - DEB SUBQ TISSUE 20 SQ CM/< ICD-10 Diagnosis Description L97.212 Non-pressure chronic ulcer of right calf with fat layer exp Modifier: osed Quantity: 1 Physician Procedures CPT4 Code: 7209470 Description: 96283 - WC PHYS SUBQ TISS 20 SQ CM ICD-10 Diagnosis Description M62.947 Non-pressure chronic ulcer of right calf with fat layer exp Modifier: osed Quantity: 1 Electronic Signature(s) Signed: 04/26/2017 8:40:52 AM By: Worthy Keeler PA-C Entered By: Worthy Keeler on 04/25/2017 13:05:12

## 2017-04-27 MED ORDER — INDOMETHACIN 25 MG PO CAPS: 25.0000 mg | ORAL_CAPSULE | Freq: Two times a day (BID) | ORAL | 0 refills | Status: DC | PRN

## 2017-04-27 NOTE — Addendum Note (Signed)
Addended by: Merrie Roof E on: 04/27/2017 10:55 AM   Modules accepted: Orders

## 2017-04-27 NOTE — Telephone Encounter (Signed)
Received fax from [place] that Colchicine 0.6 MG was denied.

## 2017-04-27 NOTE — Telephone Encounter (Signed)
Sent in indomethacin for now, will discuss long term tx and recheck levels at her upcoming appt 4/15 if we can't get a hold of her before then

## 2017-04-28 NOTE — Telephone Encounter (Signed)
Patient's phone number keeps going straight to voicemail as before. Left another message, but still unable to contact patient. Routing to provider as Juluis Rainier.

## 2017-04-29 NOTE — Progress Notes (Signed)
[name] ([id number]) Visit Report for 04/25/2017 Arrival Information Details Patient Name: [name] Date of Service: 04/25/2017 11:15 AM Medical Record Number: [id number] Patient Account Number: [id number] Date of Birth/Sex: 05/14/1965 (52 y.o. F) Treating RN: Ahmed Prima Primary Care Mathan Darroch: Merrie Roof Other Clinician: Referring Kayna Suppa: Merrie Roof Treating Damilola Flamm/Extender: Melburn Hake, HOYT Weeks in Treatment: 14 Visit Information History Since Last Visit All ordered tests and consults were completed: No Patient Arrived: Ambulatory Added or deleted any medications: No Arrival Time: 11:28 Any new allergies or adverse reactions: No Accompanied By: self Had a fall or experienced change in No Transfer Assistance: None activities of daily living that may affect Patient Identification Verified: Yes risk of falls: Secondary Verification Process Completed: Yes Signs or symptoms of abuse/neglect since last visito No Patient Requires Transmission-Based No Hospitalized since last visit: No Precautions: Implantable device outside of the clinic excluding No Patient Has Alerts: No cellular tissue based products placed in the center since last visit: Has Dressing in Place as Prescribed: Yes Pain Present Now: No Electronic Signature(s) Signed: 04/28/2017 4:32:30 PM By: Alric Quan Entered By: Alric Quan on 04/25/2017 11:29:01 [place], [name] ([id number]) -------------------------------------------------------------------------------- Encounter Discharge Information Details Patient Name: [name] Date of Service: 04/25/2017 11:15 AM Medical Record Number: [id number] Patient Account Number: [id number] Date of Birth/Sex: Jun 20, 1965 (52 y.o. F) Treating RN: Roger Shelter Primary Care Cyerra Yim: Merrie Roof Other Clinician: Referring Romuald Mccaslin: Merrie Roof Treating Shalisha Clausing/Extender: Melburn Hake, HOYT Weeks in Treatment: 14 Encounter Discharge Information  Items Discharge Pain Level: 0 Discharge Condition: Stable Ambulatory Status: Ambulatory Discharge Destination: Home Transportation: Private Auto Accompanied By: self Schedule Follow-up Appointment: Yes Medication Reconciliation completed and No provided to Patient/Care Dellis Voght: Provided on Clinical Summary of Care: 04/25/2017 Form Type Recipient Paper Patient DW Electronic Signature(s) Signed: 04/25/2017 1:00:59 PM By: Montey Hora Entered By: Montey Hora on 04/25/2017 13:00:58 [name] ([id number]) -------------------------------------------------------------------------------- Lower Extremity Assessment Details Patient Name: [name] Date of Service: 04/25/2017 11:15 AM Medical Record Number: [id number] Patient Account Number: [id number] Date of Birth/Sex: 1965-02-12 (52 y.o. F) Treating RN: Ahmed Prima Primary Care Zakari Bathe: Merrie Roof Other Clinician: Referring Tarri Guilfoil: Merrie Roof Treating Daimien Patmon/Extender: Melburn Hake, HOYT Weeks in Treatment: 14 Vascular Assessment Pulses: Dorsalis Pedis Palpable: [Right:Yes] Posterior Tibial Extremity colors, hair growth, and conditions: Extremity Color: [Right:Normal] Temperature of Extremity: [Right:Warm] Capillary Refill: [Right:< 3 seconds] Toe Nail Assessment Left: Right: Thick: Yes Discolored: No Deformed: No Improper Length and Hygiene: No Electronic Signature(s) Signed: 04/28/2017 4:32:30 PM By: Alric Quan Entered By: Alric Quan on 04/25/2017 11:36:31 [name] ([id number]) -------------------------------------------------------------------------------- Multi Wound Chart Details Patient Name: [name] Date of Service: 04/25/2017 11:15 AM Medical Record Number: [id number] Patient Account Number: [id number] Date of Birth/Sex: 1965/10/19 (52 y.o. F) Treating RN: Roger Shelter Primary Care Shaylie Eklund: Merrie Roof Other Clinician: Referring Tiaja Hagan: Merrie Roof Treating  Dayanna Pryce/Extender: Melburn Hake, HOYT Weeks in Treatment: 14 Vital Signs Height(in): 70 Pulse(bpm): 82 Weight(lbs): 225 Blood Pressure(mmHg): 120/88 Body Mass Index(BMI): 32 Temperature(F): 97.8 Respiratory Rate 18 (breaths/min): Photos: [1:No Photos] [N/A:N/A] Wound Location: [1:Right Lower Leg - Anterior] [N/A:N/A] Wounding Event: [1:Trauma] [N/A:N/A] Primary Etiology: [1:Trauma, Other] [N/A:N/A] Comorbid History: [1:Chronic Obstructive Pulmonary Disease (COPD), Sleep Apnea, Arrhythmia, Congestive Heart Failure, Deep Vein Thrombosis, Hypertension, Gout, Rheumatoid Arthritis, Osteoarthritis, Confinement Anxiety] [N/A:N/A] Date Acquired: [1:12/09/2016] [N/A:N/A] Weeks of Treatment: [1:14] [N/A:N/A] Wound Status: [1:Open] [N/A:N/A] Measurements L x W x D [1:2.3x1.7x0.2] [N/A:N/A] (cm) Area (cm) : [1:3.071] [N/A:N/A] Volume (cm) : [1:0.614] [N/A:N/A] % Reduction in Area: [1:-43.80%] [N/A:N/A] % Reduction in Volume: [1:4.20%] [  N/A:N/A] Classification: [1:Full Thickness Without Exposed Support Structures] [N/A:N/A] Exudate Amount: [1:Large] [N/A:N/A] Exudate Type: [1:Serous] [N/A:N/A] Exudate Color: [1:amber] [N/A:N/A] Wound Margin: [1:Flat and Intact] [N/A:N/A] Granulation Amount: [1:Large (67-100%)] [N/A:N/A] Granulation Quality: [1:Pink] [N/A:N/A] Necrotic Amount: [1:Small (1-33%)] [N/A:N/A] Exposed Structures: [1:Fat Layer (Subcutaneous Tissue) Exposed: Yes Fascia: No Tendon: No Muscle: No] [N/A:N/A] Joint: No Bone: No Epithelialization: None N/A N/A Periwound Skin Texture: Excoriation: No N/A N/A Induration: No Callus: No Crepitus: No Rash: No Scarring: No Periwound Skin Moisture: Maceration: No N/A N/A Dry/Scaly: No Periwound Skin Color: Hemosiderin Staining: Yes N/A N/A Atrophie Blanche: No Cyanosis: No Ecchymosis: No Erythema: No Mottled: No Pallor: No Rubor: No Temperature: No Abnormality N/A N/A Tenderness on Palpation: Yes N/A N/A Wound  Preparation: Ulcer Cleansing: N/A N/A Rinsed/Irrigated with Saline Topical Anesthetic Applied: Other: lidocaine 4% Treatment Notes Electronic Signature(s) Signed: 04/25/2017 5:00:26 PM By: Roger Shelter Entered By: Roger Shelter on 04/25/2017 11:52:52 [place], [name] ([id number]) -------------------------------------------------------------------------------- [place] Details Patient Name: [name] Date of Service: 04/25/2017 11:15 AM Medical Record Number: [id number] Patient Account Number: [id number] Date of Birth/Sex: 1965/02/19 (52 y.o. F) Treating RN: Roger Shelter Primary Care Jillann Charette: Merrie Roof Other Clinician: Referring Avishai Reihl: Merrie Roof Treating Zenaya Ulatowski/Extender: Melburn Hake, HOYT Weeks in Treatment: 14 Active Inactive ` Orientation to the Wound Care Program Nursing Diagnoses: Knowledge deficit related to the wound healing center program Goals: Patient/caregiver will verbalize understanding of the [place] Program Date Initiated: 01/12/2017 Target Resolution Date: 02/13/2016 Goal Status: Active Interventions: Provide education on orientation to the wound center Notes: ` Wound/Skin Impairment Nursing Diagnoses: Impaired tissue integrity Knowledge deficit related to ulceration/compromised skin integrity Goals: Patient/caregiver will verbalize understanding of skin care regimen Date Initiated: 01/12/2017 Target Resolution Date: 02/12/2017 Goal Status: Active Ulcer/skin breakdown will have a volume reduction of 30% by week 4 Date Initiated: 01/12/2017 Target Resolution Date: 02/12/2017 Goal Status: Active Interventions: Assess patient/caregiver ability to obtain necessary supplies Assess ulceration(s) every visit Provide education on ulcer and skin care Treatment Activities: Skin care regimen initiated : 01/12/2017 Notes: Electronic Signature(s) Signed: 04/25/2017 5:00:26 PM By: Viona Gilmore, [name]  ([id number]) Entered By: Roger Shelter on 04/25/2017 11:52:33 [name], [name] ([id number]) -------------------------------------------------------------------------------- Pain Assessment Details Patient Name: [name] Date of Service: 04/25/2017 11:15 AM Medical Record Number: [id number] Patient Account Number: [id number] Date of Birth/Sex: [birth year] (52 y.o. F) Treating RN: Ahmed Prima Primary Care Anyssa Sharpless: Merrie Roof Other Clinician: Referring Carra Brindley: Merrie Roof Treating Chloe Baig/Extender: Melburn Hake, HOYT Weeks in Treatment: 14 Active Problems Location of Pain Severity and Description of Pain Patient Has Paino No Site Locations Pain Management and Medication Current Pain Management: Electronic Signature(s) Signed: 04/28/2017 4:32:30 PM By: Alric Quan Entered By: Alric Quan on 04/25/2017 11:29:07 [name] ([id number]) -------------------------------------------------------------------------------- Patient/Caregiver Education Details Patient Name: [name] Date of Service: 04/25/2017 11:15 AM Medical Record Number: [id number] Patient Account Number: [id number] Date of Birth/Gender: 30-Jul-1965 (52 y.o. F) Treating RN: Montey Hora Primary Care Physician: Merrie Roof Other Clinician: Referring Physician: Merrie Roof Treating Physician/Extender: Sharalyn Ink in Treatment: 14 Education Assessment Education Provided To: Patient Education Topics Provided Wound/Skin Impairment: Handouts: Other: wound care as ordered Methods: Demonstration, Explain/Verbal Responses: State content correctly Electronic Signature(s) Signed: 04/25/2017 5:08:18 PM By: Montey Hora Entered By: Montey Hora on 04/25/2017 13:01:17 [name], [name] ([id number]) -------------------------------------------------------------------------------- Wound Assessment Details Patient Name: [name] Date of Service: 04/25/2017 11:15 AM Medical Record Number:  [id number] Patient Account Number: [id number] Date of Birth/Sex: 1965/02/06 (52 y.o. F) Treating RN: Ahmed Prima Primary Care  Zymier Rodgers: Merrie Roof Other Clinician: Referring Mega Kinkade: Merrie Roof Treating Trameka Dorough/Extender: Melburn Hake, HOYT Weeks in Treatment: 14 Wound Status Wound Number: 1 Primary Trauma, Other Etiology: Wound Location: Right Lower Leg - Anterior Wound Open Wounding Event: Trauma Status: Date Acquired: 12/09/2016 Comorbid Chronic Obstructive Pulmonary Disease (COPD), Weeks Of Treatment: 14 History: Sleep Apnea, Arrhythmia, Congestive Heart Clustered Wound: No Failure, Deep Vein Thrombosis, Hypertension, Gout, Rheumatoid Arthritis, Osteoarthritis, Confinement Anxiety Photos Photo Uploaded By: Roger Shelter on 04/25/2017 16:56:36 Wound Measurements Length: (cm) 2.3 Width: (cm) 1.7 Depth: (cm) 0.2 Area: (cm) 3.071 Volume: (cm) 0.614 % Reduction in Area: -43.8% % Reduction in Volume: 4.2% Epithelialization: None Tunneling: No Undermining: No Wound Description Full Thickness Without Exposed Support Classification: Structures Wound Margin: Flat and Intact Exudate Large Amount: Exudate Type: Serous Exudate Color: amber Foul Odor After Cleansing: No Slough/Fibrino Yes Wound Bed Granulation Amount: Large (67-100%) Exposed Structure Granulation Quality: Pink Fascia Exposed: No Necrotic Amount: Small (1-33%) Fat Layer (Subcutaneous Tissue) Exposed: Yes Necrotic Quality: Adherent Slough Tendon Exposed: No Muscle Exposed: No [name], [name] ([id number]) Joint Exposed: No Bone Exposed: No Periwound Skin Texture Texture Color No Abnormalities Noted: No No Abnormalities Noted: No Callus: No Atrophie Blanche: No Crepitus: No Cyanosis: No Excoriation: No Ecchymosis: No Induration: No Erythema: No Rash: No Hemosiderin Staining: Yes Scarring: No Mottled: No Pallor: No Moisture Rubor: No No Abnormalities Noted: No Dry / Scaly: No  Temperature / Pain Maceration: No Temperature: No Abnormality Tenderness on Palpation: Yes Wound Preparation Ulcer Cleansing: Rinsed/Irrigated with Saline Topical Anesthetic Applied: Other: lidocaine 4%, Treatment Notes Wound #1 (Right, Anterior Lower Leg) 1. Cleansed with: Clean wound with Normal Saline 2. Anesthetic Topical Lidocaine 4% cream to wound bed prior to debridement 4. Dressing Applied: Hydrafera Blue 5. Secondary Dressing Applied Bordered Foam Dressing Electronic Signature(s) Signed: 04/28/2017 4:32:30 PM By: Alric Quan Entered By: Alric Quan on 04/25/2017 11:34:06 [place], [name] ([id number]) -------------------------------------------------------------------------------- Vitals Details Patient Name: [name] Date of Service: 04/25/2017 11:15 AM Medical Record Number: [id number] Patient Account Number: [id number] Date of Birth/Sex: 1965-10-24 (52 y.o. F) Treating RN: Ahmed Prima Primary Care Jerri Glauser: Merrie Roof Other Clinician: Referring Kainalu Heggs: Merrie Roof Treating Tahj Njoku/Extender: Melburn Hake, HOYT Weeks in Treatment: 14 Vital Signs Time Taken: 11:31 Temperature (F): 97.8 Height (in): 70 Pulse (bpm): 82 Weight (lbs): 225 Respiratory Rate (breaths/min): 18 Body Mass Index (BMI): 32.3 Blood Pressure (mmHg): 120/88 Reference Range: 80 - 120 mg / dl Electronic Signature(s) Signed: 04/28/2017 4:32:30 PM By: Alric Quan Entered By: Alric Quan on 04/25/2017 11:31:43

## 2017-05-02 ENCOUNTER — Ambulatory Visit: Payer: Medicaid Other | Admitting: Physician Assistant

## 2017-05-09 ENCOUNTER — Encounter: Payer: Self-pay | Admitting: Family Medicine

## 2017-05-09 ENCOUNTER — Encounter: Payer: Medicaid Other | Admitting: Physician Assistant

## 2017-05-09 ENCOUNTER — Ambulatory Visit (INDEPENDENT_AMBULATORY_CARE_PROVIDER_SITE_OTHER): Payer: Medicaid Other | Admitting: Family Medicine

## 2017-05-09 VITALS — BP 132/95 | HR 73 | Temp 98.4°F | Ht 66.0 in | Wt 223.5 lb

## 2017-05-09 DIAGNOSIS — I4891 Unspecified atrial fibrillation: Secondary | ICD-10-CM

## 2017-05-09 DIAGNOSIS — Z23 Encounter for immunization: Secondary | ICD-10-CM | POA: Diagnosis not present

## 2017-05-09 DIAGNOSIS — I10 Essential (primary) hypertension: Secondary | ICD-10-CM

## 2017-05-09 DIAGNOSIS — Z86711 Personal history of pulmonary embolism: Secondary | ICD-10-CM

## 2017-05-09 DIAGNOSIS — M1A9XX0 Chronic gout, unspecified, without tophus (tophi): Secondary | ICD-10-CM

## 2017-05-09 DIAGNOSIS — J449 Chronic obstructive pulmonary disease, unspecified: Secondary | ICD-10-CM

## 2017-05-09 DIAGNOSIS — I5032 Chronic diastolic (congestive) heart failure: Secondary | ICD-10-CM

## 2017-05-09 DIAGNOSIS — Z0000 Encounter for general adult medical examination without abnormal findings: Secondary | ICD-10-CM

## 2017-05-09 DIAGNOSIS — E785 Hyperlipidemia, unspecified: Secondary | ICD-10-CM | POA: Diagnosis not present

## 2017-05-09 DIAGNOSIS — Z8639 Personal history of other endocrine, nutritional and metabolic disease: Secondary | ICD-10-CM | POA: Diagnosis not present

## 2017-05-09 DIAGNOSIS — M109 Gout, unspecified: Secondary | ICD-10-CM | POA: Insufficient documentation

## 2017-05-09 DIAGNOSIS — G4730 Sleep apnea, unspecified: Secondary | ICD-10-CM | POA: Diagnosis not present

## 2017-05-09 DIAGNOSIS — Z0001 Encounter for general adult medical examination with abnormal findings: Secondary | ICD-10-CM | POA: Diagnosis not present

## 2017-05-09 DIAGNOSIS — Z1211 Encounter for screening for malignant neoplasm of colon: Secondary | ICD-10-CM

## 2017-05-09 DIAGNOSIS — I87311 Chronic venous hypertension (idiopathic) with ulcer of right lower extremity: Secondary | ICD-10-CM | POA: Diagnosis not present

## 2017-05-09 DIAGNOSIS — Z1239 Encounter for other screening for malignant neoplasm of breast: Secondary | ICD-10-CM

## 2017-05-09 LAB — UA/M W/RFLX CULTURE, ROUTINE
Bilirubin, UA: NEGATIVE
GLUCOSE, UA: NEGATIVE
NITRITE UA: NEGATIVE
RBC, UA: NEGATIVE
SPEC GRAV UA: 1.02 (ref 1.005–1.030)
Urobilinogen, Ur: 0.2 mg/dL (ref 0.2–1.0)
pH, UA: 6.5 (ref 5.0–7.5)

## 2017-05-09 LAB — MICROSCOPIC EXAMINATION

## 2017-05-09 MED ORDER — ALLOPURINOL 100 MG PO TABS: 100.0000 mg | ORAL_TABLET | Freq: Every day | ORAL | 6 refills | Status: DC

## 2017-05-09 MED ORDER — CLONIDINE HCL 0.1 MG PO TABS: 0.1000 mg | ORAL_TABLET | Freq: Two times a day (BID) | ORAL | 6 refills | Status: DC

## 2017-05-09 NOTE — Patient Instructions (Signed)
[facility] - [phone]    

## 2017-05-09 NOTE — Progress Notes (Signed)
BP (!) 132/95 (BP Location: Right Arm, Patient Position: Sitting, Cuff Size: Normal)   Pulse 73   Temp 98.4 F (36.9 C) (Oral)   Ht 5\' 6"  (1.676 m)   Wt 223 lb 8 oz (101.4 kg)   SpO2 96%   BMI 36.07 kg/m    Subjective:    Patient ID: [name], female    DOB: 03-06-1965, 52 y.o.   MRN: [id number]  HPI: [name] is a 52 y.o. female presenting on 05/09/2017 for comprehensive medical examination. Current medical complaints include:see below  Currently on colchicine for the past few weeks, wanting to go back on allopurinol 100 mg which she was previously on for gout control. Continues to have left great toe pain, redness, and swelling.   BPs significantly improved back on her previous regimen, and weights/edema stable. Denies CP, SOB, dizziness, HAs.   Needs letter stating when to stop any anticoagulation medications for a dental procedure.   Depression Screen done today and results listed below:  Depression screen [facility] 2/9 05/09/2017 04/07/2017 03/24/2017 06/14/2016 06/03/2016  Decreased Interest 1 1 0 0 0  Down, Depressed, Hopeless 1 1 1  0 0  PHQ - 2 Score 2 2 1  0 0  Altered sleeping 2 2 - - -  Tired, decreased energy 2 1 - - -  Change in appetite 3 0 - - -  Feeling bad or failure about yourself  1 0 - - -  Trouble concentrating 1 0 - - -  Moving slowly or fidgety/restless 1 1 - - -  Suicidal thoughts 1 0 - - -  PHQ-9 Score 13 6 - - -  Difficult doing work/chores - - - - -    The patient does not have a history of falls. I did not complete a risk assessment for falls. A plan of care for falls was not documented.   Past Medical History:  Past Medical History:  Diagnosis Date  . A-fib ([place]) 2010  . Adrenal adenoma, left   . Alcohol abuse   . Allergy   . CHF (congestive heart failure) ([place])   . COPD (chronic obstructive pulmonary disease) ([place])   . Fatty liver   . GERD (gastroesophageal reflux disease)   . Gout   . Hypertension   . Mild cognitive impairment   .  Non-healing non-surgical wound    right leg  . Obesity   . Pulmonary embolus ([place])   . Sleep apnea   . Stroke ([place])   . Thyroid disease     Surgical History:  Past Surgical History:  Procedure Laterality Date  . bilateral wrist fractures  2010  . lt ankle fracture  2002    Medications:  Current Outpatient Medications on File Prior to Visit  Medication Sig  . amLODipine (NORVASC) 10 MG tablet Take 1 tablet (10 mg total) by mouth daily.  Marland Kitchen aspirin EC 81 MG tablet Take 81 mg by mouth daily.  Marland Kitchen atorvastatin (LIPITOR) 20 MG tablet Take 1 tablet (20 mg total) by mouth daily.  . butalbital-acetaminophen-caffeine (FIORICET, ESGIC) 50-325-40 MG tablet Take 1-2 tablets by mouth every 6 (six) hours as needed for headache.  . colchicine 0.6 MG tablet Take 1 tablet (0.6 mg total) by mouth 2 (two) times daily. As needed for gout flares  . diphenhydrAMINE (BENADRYL) 25 MG tablet Take 25 mg by mouth every 6 (six) hours as needed.  . furosemide (LASIX) 40 MG tablet Take 1 tablet (40 mg total) by mouth daily.  Marland Kitchen  hydrALAZINE (APRESOLINE) 50 MG tablet Take 1 tablet (50 mg total) by mouth 3 (three) times daily.  Marland Kitchen lisinopril (PRINIVIL,ZESTRIL) 20 MG tablet Take 1 tablet (20 mg total) by mouth daily.  . metoprolol succinate (TOPROL-XL) 100 MG 24 hr tablet Take 1 tablet (100 mg total) by mouth daily. Take with or immediately following a meal.  . ondansetron (ZOFRAN ODT) 4 MG disintegrating tablet Take 1 tablet (4 mg total) by mouth every 8 (eight) hours as needed for nausea or vomiting.  . potassium chloride SA (KLOR-CON M20) 20 MEQ tablet Take 1 tablet (20 mEq total) by mouth daily.  . sertraline (ZOLOFT) 100 MG tablet Take 1 tablet (100 mg total) by mouth daily.  Marland Kitchen spironolactone (ALDACTONE) 25 MG tablet Take 2 tablets (50 mg total) by mouth daily.   No current facility-administered medications on file prior to visit.     Allergies:  Allergies  Allergen Reactions  . Morphine And Related Hives  .  Penicillins Other (See Comments)    Painful urination Has patient had a PCN reaction causing immediate rash, facial/tongue/throat swelling, SOB or lightheadedness with hypotension: yes Has patient had a PCN reaction causing severe rash involving mucus membranes or skin necrosis: no Has patient had a PCN reaction that required hospitalization no Has patient had a PCN reaction occurring within the last 10 years: no If all of the above answers are "NO", then may proceed with Cephalosporin use.     Social History:  Social History   Socioeconomic History  . Marital status: Legally Separated    Spouse name: Not on file  . Number of children: Not on file  . Years of education: Not on file  . Highest education level: Not on file  Occupational History  . Not on file  Social Needs  . Financial resource strain: Not on file  . Food insecurity:    Worry: Not on file    Inability: Not on file  . Transportation needs:    Medical: Not on file    Non-medical: Not on file  Tobacco Use  . Smoking status: Former Smoker    Types: Cigarettes    Last attempt to quit: 01/26/2016    Years since quitting: 1.2  . Smokeless tobacco: Never Used  Substance and Sexual Activity  . Alcohol use: Not Currently    Comment: occ  . Drug use: No  . Sexual activity: Never  Lifestyle  . Physical activity:    Days per week: Not on file    Minutes per session: Not on file  . Stress: Not on file  Relationships  . Social connections:    Talks on phone: Not on file    Gets together: Not on file    Attends religious service: Not on file    Active member of club or organization: Not on file    Attends meetings of clubs or organizations: Not on file    Relationship status: Not on file  . Intimate partner violence:    Fear of current or ex partner: Not on file    Emotionally abused: Not on file    Physically abused: Not on file    Forced sexual activity: Not on file  Other Topics Concern  . Not on file    Social History Narrative  . Not on file   Social History   Tobacco Use  Smoking Status Former Smoker  . Types: Cigarettes  . Last attempt to quit: 01/26/2016  . Years since quitting: 1.2  Smokeless Tobacco Never Used   Social History   Substance and Sexual Activity  Alcohol Use Not Currently   Comment: occ    Family History:  Family History  Problem Relation Age of Onset  . Prostate cancer Father   . Rectal cancer Sister   . Breast cancer Sister   . Cancer Maternal Grandmother   . Cancer Maternal Grandfather     Past medical history, surgical history, medications, allergies, family history and social history reviewed with patient today and changes made to appropriate areas of the chart.   Review of Systems - General ROS: negative Psychological ROS: negative Ophthalmic ROS: negative ENT ROS: negative Breast ROS: negative for breast lumps Respiratory ROS: no cough, shortness of breath, or wheezing Cardiovascular ROS: no chest pain or dyspnea on exertion Gastrointestinal ROS: no abdominal pain, change in bowel habits, or black or bloody stools Genito-Urinary ROS: no dysuria, trouble voiding, or hematuria Musculoskeletal ROS: positive for - joint pain and joint swelling Neurological ROS: no TIA or stroke symptoms Dermatological ROS: negative All other ROS negative except what is listed above and in the HPI.      Objective:    BP (!) 132/95 (BP Location: Right Arm, Patient Position: Sitting, Cuff Size: Normal)   Pulse 73   Temp 98.4 F (36.9 C) (Oral)   Ht 5\' 6"  (1.676 m)   Wt 223 lb 8 oz (101.4 kg)   SpO2 96%   BMI 36.07 kg/m   Wt Readings from Last 3 Encounters:  05/09/17 223 lb 8 oz (101.4 kg)  04/12/17 232 lb (105.2 kg)  04/07/17 232 lb 3.2 oz (105.3 kg)    Physical Exam  Constitutional: She is oriented to person, place, and time. She appears well-developed and well-nourished. No distress.  HENT:  Head: Atraumatic.  Right Ear: External ear normal.   Left Ear: External ear normal.  Nose: Nose normal.  Mouth/Throat: Oropharynx is clear and moist. No oropharyngeal exudate.  Eyes: Pupils are equal, round, and reactive to light. Conjunctivae are normal. No scleral icterus.  Neck: Normal range of motion. Neck supple. No thyromegaly present.  Cardiovascular: Normal rate, regular rhythm, normal heart sounds and intact distal pulses.  Pulmonary/Chest: Effort normal and breath sounds normal. No respiratory distress. Right breast exhibits no mass, no skin change and no tenderness. Left breast exhibits no mass, no skin change and no tenderness. Breasts are symmetrical.  Abdominal: Soft. Bowel sounds are normal. She exhibits no mass. There is no tenderness.  Musculoskeletal: Normal range of motion. She exhibits edema (trace b/l LE edema) and tenderness (left great toe erythematous, edematous, and ttp).  Lymphadenopathy:    She has no cervical adenopathy.    She has no axillary adenopathy.  Neurological: She is alert and oriented to person, place, and time. No cranial nerve deficit.  Skin: Skin is warm and dry. No rash noted.  Psychiatric: She has a normal mood and affect. Her behavior is normal.  Nursing note and vitals reviewed.   Results for orders placed or performed in visit on 05/09/17  Microscopic Examination  Result Value Ref Range   WBC, UA 0-5 0 - 5 /hpf   RBC, UA 0-2 0 - 2 /hpf   Epithelial Cells (non renal) 0-10 0 - 10 /hpf   Bacteria, UA Few None seen/Few  CBC with Differential/Platelet  Result Value Ref Range   WBC 8.4 3.4 - 10.8 x10E3/uL   RBC 4.12 3.77 - 5.28 x10E6/uL   Hemoglobin 11.6 11.1 - 15.9 g/dL  Hematocrit 35.4 34.0 - 46.6 %   MCV 86 79 - 97 fL   MCH 28.2 26.6 - 33.0 pg   MCHC 32.8 31.5 - 35.7 g/dL   RDW 14.1 12.3 - 15.4 %   Platelets 346 150 - 379 x10E3/uL   Neutrophils 68 Not Estab. %   Lymphs 22 Not Estab. %   Monocytes 9 Not Estab. %   Eos 1 Not Estab. %   Basos 0 Not Estab. %   Neutrophils Absolute 5.7  1.4 - 7.0 x10E3/uL   Lymphocytes Absolute 1.9 0.7 - 3.1 x10E3/uL   Monocytes Absolute 0.7 0.1 - 0.9 x10E3/uL   EOS (ABSOLUTE) 0.1 0.0 - 0.4 x10E3/uL   Basophils Absolute 0.0 0.0 - 0.2 x10E3/uL   Immature Granulocytes 0 Not Estab. %   Immature Grans (Abs) 0.0 0.0 - 0.1 x10E3/uL  Comprehensive metabolic panel  Result Value Ref Range   Glucose 85 65 - 99 mg/dL   BUN 13 6 - 24 mg/dL   Creatinine, Ser 0.81 0.57 - 1.00 mg/dL   GFR calc non Af Amer 84 >59 mL/min/1.73   GFR calc Af Amer 97 >59 mL/min/1.73   BUN/Creatinine Ratio 16 9 - 23   Sodium 147 (H) 134 - 144 mmol/L   Potassium 3.4 (L) 3.5 - 5.2 mmol/L   Chloride 106 96 - 106 mmol/L   CO2 23 20 - 29 mmol/L   Calcium 9.1 8.7 - 10.2 mg/dL   Total Protein 6.3 6.0 - 8.5 g/dL   Albumin 3.6 3.5 - 5.5 g/dL   Globulin, Total 2.7 1.5 - 4.5 g/dL   Albumin/Globulin Ratio 1.3 1.2 - 2.2   Bilirubin Total <0.2 0.0 - 1.2 mg/dL   Alkaline Phosphatase 58 39 - 117 IU/L   AST 12 0 - 40 IU/L   ALT 6 0 - 32 IU/L  Lipid Panel w/o Chol/HDL Ratio  Result Value Ref Range   Cholesterol, Total 202 (H) 100 - 199 mg/dL   Triglycerides 111 0 - 149 mg/dL   HDL 40 >39 mg/dL   VLDL Cholesterol Cal 22 5 - 40 mg/dL   LDL Calculated 140 (H) 0 - 99 mg/dL  TSH  Result Value Ref Range   TSH 0.481 0.450 - 4.500 uIU/mL  UA/M w/rflx Culture, Routine  Result Value Ref Range   Specific Gravity, UA 1.020 1.005 - 1.030   pH, UA 6.5 5.0 - 7.5   Color, UA Orange Yellow   Appearance Ur Cloudy (A) Clear   Leukocytes, UA Trace (A) Negative   Protein, UA 1+ (A) Negative/Trace   Glucose, UA Negative Negative   Ketones, UA Trace (A) Negative   RBC, UA Negative Negative   Bilirubin, UA Negative Negative   Urobilinogen, Ur 0.2 0.2 - 1.0 mg/dL   Nitrite, UA Negative Negative   Microscopic Examination See below:   Uric acid  Result Value Ref Range   Uric Acid 6.5 2.5 - 7.1 mg/dL      Assessment & Plan:   Problem List Items Addressed This Visit      Cardiovascular  and Mediastinum   Essential hypertension - Primary (Chronic)    Significantly improved back on full regimen, continue present medications, DASH diet, increase exercise      Relevant Medications   cloNIDine (CATAPRES) 0.1 MG tablet   Other Relevant Orders   CBC with Differential/Platelet (Completed)   Comprehensive metabolic panel (Completed)   UA/M w/rflx Culture, Routine (Completed)   Chronic diastolic heart failure (HCC) (Chronic)  Followed by HF clinic, continue per their recommendations. Monitor weights closely per their protocol      Relevant Medications   cloNIDine (CATAPRES) 0.1 MG tablet   A-fib (HCC)    Rate well controlled, continue current regimen      Relevant Medications   cloNIDine (CATAPRES) 0.1 MG tablet     Respiratory   COPD (chronic obstructive pulmonary disease) (HCC)    Stable off inhalers      Sleep apnea     Other   Hyperlipidemia    Await lipid results, continue current regimen and lifestyle modifications      Relevant Medications   cloNIDine (CATAPRES) 0.1 MG tablet   Other Relevant Orders   Lipid Panel w/o Chol/HDL Ratio (Completed)   History of pulmonary embolism    S/p 6 months of anticoagulation directly after event, asymptomatic since. Continue baby ASA      History of thyroid disease    Recheck TSH today, s/p radioactive ablation with stable thyroid function since procedure off medications      Relevant Orders   TSH (Completed)   Gout    Check uric acid and renal function today. Start allopurinol 100 mg and taper off colchicine. F/u if no improvement      Relevant Orders   Uric acid (Completed)    Other Visit Diagnoses    Annual physical exam       Need for Tdap vaccination       Relevant Orders   Tdap vaccine greater than or equal to 7yo IM (Completed)   Screening for breast cancer       Relevant Orders   MM DIGITAL SCREENING BILATERAL   Screening for colon cancer       Relevant Orders   Ambulatory referral to  Gastroenterology       Follow up plan: Return for Ear lavage ASAP, 6 month f/u .   LABORATORY TESTING:  - Pap smear: refused  IMMUNIZATIONS:   - Tdap: Tetanus vaccination status reviewed: Td vaccination indicated and given today. - Influenza: Postponed to flu season  SCREENING: -Mammogram: Ordered today  - Colonoscopy: Ordered today   PATIENT COUNSELING:   Advised to take 1 mg of folate supplement per day if capable of pregnancy.   Sexuality: Discussed sexually transmitted diseases, partner selection, use of condoms, avoidance of unintended pregnancy  and contraceptive alternatives.   Advised to avoid cigarette smoking.  I discussed with the patient that most people either abstain from alcohol or drink within safe limits (<=14/week and <=4 drinks/occasion for males, <=7/weeks and <= 3 drinks/occasion for females) and that the risk for alcohol disorders and other health effects rises proportionally with the number of drinks per week and how often a drinker exceeds daily limits.  Discussed cessation/primary prevention of drug use and availability of treatment for abuse.   Diet: Encouraged to adjust caloric intake to maintain  or achieve ideal body weight, to reduce intake of dietary saturated fat and total fat, to limit sodium intake by avoiding high sodium foods and not adding table salt, and to maintain adequate dietary potassium and calcium preferably from fresh fruits, vegetables, and low-fat dairy products.    stressed the importance of regular exercise  Injury prevention: Discussed safety belts, safety helmets, smoke detector, smoking near bedding or upholstery.   Dental health: Discussed importance of regular tooth brushing, flossing, and dental visits.    NEXT PREVENTATIVE PHYSICAL DUE IN 1 YEAR. Return for Ear lavage ASAP, 6 month f/u .

## 2017-05-10 LAB — COMPREHENSIVE METABOLIC PANEL
ALBUMIN: 3.6 g/dL (ref 3.5–5.5)
ALT: 6 IU/L (ref 0–32)
AST: 12 IU/L (ref 0–40)
Albumin/Globulin Ratio: 1.3 (ref 1.2–2.2)
Alkaline Phosphatase: 58 IU/L (ref 39–117)
BUN / CREAT RATIO: 16 (ref 9–23)
BUN: 13 mg/dL (ref 6–24)
Bilirubin Total: <0.2 mg/dL (ref 0.0–1.2)
CO2: 23 mmol/L (ref 20–29)
CREATININE: 0.81 mg/dL (ref 0.57–1.00)
Calcium: 9.1 mg/dL (ref 8.7–10.2)
Chloride: 106 mmol/L (ref 96–106)
GFR calc non Af Amer: 84 mL/min/{1.73_m2} (ref >59)
GFR, EST AFRICAN AMERICAN: 97 mL/min/{1.73_m2} (ref >59)
GLOBULIN, TOTAL: 2.7 g/dL (ref 1.5–4.5)
GLUCOSE: 85 mg/dL (ref 65–99)
Potassium: 3.4 mmol/L — ABNORMAL LOW (ref 3.5–5.2)
Sodium: 147 mmol/L — ABNORMAL HIGH (ref 134–144)
Total Protein: 6.3 g/dL (ref 6.0–8.5)

## 2017-05-10 LAB — CBC WITH DIFFERENTIAL/PLATELET
BASOS ABS: 0 10*3/uL (ref 0.0–0.2)
Basos: 0 %
EOS (ABSOLUTE): 0.1 10*3/uL (ref 0.0–0.4)
Eos: 1 %
HEMATOCRIT: 35.4 % (ref 34.0–46.6)
Hemoglobin: 11.6 g/dL (ref 11.1–15.9)
IMMATURE GRANS (ABS): 0 10*3/uL (ref 0.0–0.1)
Immature Granulocytes: 0 %
LYMPHS: 22 %
Lymphocytes Absolute: 1.9 10*3/uL (ref 0.7–3.1)
MCH: 28.2 pg (ref 26.6–33.0)
MCHC: 32.8 g/dL (ref 31.5–35.7)
MCV: 86 fL (ref 79–97)
MONOCYTES: 9 %
Monocytes Absolute: 0.7 10*3/uL (ref 0.1–0.9)
NEUTROS ABS: 5.7 10*3/uL (ref 1.4–7.0)
Neutrophils: 68 %
Platelets: 346 10*3/uL (ref 150–379)
RBC: 4.12 x10E6/uL (ref 3.77–5.28)
RDW: 14.1 % (ref 12.3–15.4)
WBC: 8.4 10*3/uL (ref 3.4–10.8)

## 2017-05-10 LAB — TSH: TSH: 0.481 u[IU]/mL (ref 0.450–4.500)

## 2017-05-10 LAB — LIPID PANEL W/O CHOL/HDL RATIO
Cholesterol, Total: 202 mg/dL — ABNORMAL HIGH (ref 100–199)
HDL: 40 mg/dL (ref >39)
LDL CALC: 140 mg/dL — AB (ref 0–99)
Triglycerides: 111 mg/dL (ref 0–149)
VLDL CHOLESTEROL CAL: 22 mg/dL (ref 5–40)

## 2017-05-10 LAB — URIC ACID: URIC ACID: 6.5 mg/dL (ref 2.5–7.1)

## 2017-05-12 ENCOUNTER — Ambulatory Visit: Payer: Medicaid Other | Admitting: Family Medicine

## 2017-05-12 NOTE — Assessment & Plan Note (Signed)
Check uric acid and renal function today. Start allopurinol 100 mg and taper off colchicine. F/u if no improvement

## 2017-05-12 NOTE — Assessment & Plan Note (Signed)
Recheck TSH today, s/p radioactive ablation with stable thyroid function since procedure off medications

## 2017-05-12 NOTE — Assessment & Plan Note (Signed)
Followed by HF clinic, continue per their recommendations. Monitor weights closely per their protocol

## 2017-05-12 NOTE — Assessment & Plan Note (Signed)
Significantly improved back on full regimen, continue present medications, DASH diet, increase exercise

## 2017-05-12 NOTE — Assessment & Plan Note (Signed)
Stable off inhalers

## 2017-05-12 NOTE — Assessment & Plan Note (Signed)
Rate well controlled, continue current regimen 

## 2017-05-12 NOTE — Assessment & Plan Note (Signed)
S/p 6 months of anticoagulation directly after event, asymptomatic since. Continue baby ASA

## 2017-05-12 NOTE — Assessment & Plan Note (Signed)
Await lipid results, continue current regimen and lifestyle modifications

## 2017-05-13 NOTE — Progress Notes (Signed)
STEPHENIA, Randall ([id number]) Visit Report for 05/09/2017 Chief Complaint Document Details Patient Name: [name], [name] Date of Service: 05/09/2017 11:15 AM Medical Record Number: [id number] Patient Account Number: [id number] Date of Birth/Sex: [birth year] (52 y.o. F) Treating RN: Roger Shelter Primary Care Provider: Merrie Roof Other Clinician: Referring Provider: Merrie Roof Treating Provider/Extender: Melburn Hake, Makelle Marrone Weeks in Treatment: 16 Information Obtained from: Patient Chief Complaint Right LE Ulcer Electronic Signature(s) Signed: 05/09/2017 3:59:23 PM By: Worthy Keeler PA-C Entered By: Worthy Keeler on 05/09/2017 11:27:06 [place], [name] ([id number]) -------------------------------------------------------------------------------- HPI Details Patient Name: [name] Date of Service: 05/09/2017 11:15 AM Medical Record Number: [id number] Patient Account Number: [id number] Date of Birth/Sex: 1965/11/08 (52 y.o. F) Treating RN: Roger Shelter Primary Care Provider: Merrie Roof Other Clinician: Referring Provider: Merrie Roof Treating Provider/Extender: Melburn Hake, Amin Fornwalt Weeks in Treatment: 16 History of Present Illness HPI Description: 01/12/17; this is a 52 year old nondiabetic remote ex-smoker. She tells me that sometime in early November she developed a sudden painful area on her right medial lower leg while she was in bed at night she reached down and scratch the area and woke up in the morning with a scab small wound. This gradually got worse over time. She was seen in the ER on 12/18/16. At that point the possibility of an insect bite/brown recluse spider bite was brought up. At that point it was 1 cm eschar covered wound. She was treated with Bactrim DS. She was back in the ER on 01/09/17 with increasing size pain of the wound. At this point the patient states it was covered with a black eschar at surface. An x-ray showed punctate radio density material within the  wound but no osteomyelitis. 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 She was put on clindamycin. The patient was seen on 12/7 by vascular surgery. At that point noting a 3 x 3 cm necrotic wound. ABIs were ordered. She was treated with Silvadene cream. I don't see the results of the ABIs at this point. She is using Silvadene cream. A comprehensive metabolic panel CBC and differential PT and PTT were done which were also normal. 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 COPD/asthma/depression and a history of atrial fib. She tells me she had a history of vein stripping many years ago ABIs in our clinic were noncompressible on the right at 1.59 01/19/17; right medial lower leg wound. Small wound with some depth. Still requiring debridement we are using Santyl. She has vascular studies on January 9 but at this point I can't tell whether these are arterial venous or both. They are at [facility] 01/27/16; right medial lower leg wound. She has arterial and venous studies later this month. The wound looks somewhat better after debridement. We've been using Santyl. 02/08/17 on evaluation today patient has had for arterial studies performed. This showed noncompressible measurements in regard to the ABI although she did have tried phasic flow and it stated in the report that she did have good TBI's. With that being said I see no documentation of TBI's and I cannot find the final report in epic only see what the patient brought in to show me today. We obviously are going to need to contact the office to see about getting the final report. We have been using Santyl and this does seem to be helping the wound to a degree. Patient is having no significant pain. 03/21/17 on evaluation today patient appears to be a little bit  larger in regard to her ulcer. It has actually been almost a month since we have last seen her. She states that she has continued to use  the [place] during that time. With that being said she is noting that she does have "pus" noted on the surface of the wound. Upon further inspection it actually appears that this is more slough and not pass noted at this point. There is no erythema surrounding the wound and nothing that would suggest that she is having an infection at this point. Her blood pressure is significantly high at this time. She states that she is just about out of blood pressure medication obviously we did tell her we cannot prescribed this needs to see her primary care provider as soon as possible. 04/04/17 on evaluation today patient states that over the past week she's been having a lot of significant burning with her dressings. We did switch her to the Iodoflex recently and I believe this may be the reason that she's been experiencing the burning type sensations. Nonetheless she really did not tell me about this as much last week and even over the past week she did not call list in order to tell us that she was having any issues. Nonetheless the wound does appear to be better. 04/11/17 on evaluation today patient actually appears to be doing fairly well in regard to her ulcer. There is some Slough noted on the surface of the wound but no evidence of infection, erythema, or significant pain. She does tell us that she received a new blood pressure medication from her primary care physician on Friday however she has not picked that up as of yet. She is actually going to pick it up when she leaves here today. Nonetheless her blood pressure was significantly high unfortunately [place], [place] ([id number]) today. The good news is her dressing does not seem to be causing her any difficulties like it did with the Iodoflex. 04/25/17 on evaluation today patient appears to be doing very well in regard to her right lower extremity ulcer. She has been tolerating the dressing changes without complication. At this point she states that she  is having no significant pain in regard to the ulcer. With that being said she does have some issues with her left first metatarsal distally where she recently has been dealing with gout and she also recently had the flu she was in the hospital for these two issues. Fortunately both appear to be doing much better at this point. Again her wound is showing signs of improvement compared to previous weeks measurements. 05/09/17 on evaluation today patient appears to be doing excellent in regard to her right lower from the ulcer. She is still an excellent signs of progress with great epithelialization and the hyper granulation seems to be doing much better. Fortunately there does not appear to be any evidence of infection at this time. Electronic Signature(s) Signed: 05/09/2017 3:59:23 PM By: Worthy Keeler PA-C Entered By: Worthy Keeler on 05/09/2017 11:54:27 [place], [name] ([id number]) -------------------------------------------------------------------------------- Physical Exam Details Patient Name: [name] Date of Service: 05/09/2017 11:15 AM Medical Record Number: [id number] Patient Account Number: [id number] Date of Birth/Sex: 10-05-1965 (52 y.o. F) Treating RN: Roger Shelter Primary Care Provider: Merrie Roof Other Clinician: Referring Provider: Merrie Roof Treating Provider/Extender: [name], [name] Weeks in Treatment: 25 Constitutional Well-nourished and well-hydrated in no acute distress. Respiratory normal breathing without difficulty. clear to auscultation bilaterally. Cardiovascular regular rate and rhythm with normal S1, S2. Psychiatric this patient  is able to make decisions and demonstrates good insight into disease process. Alert and Oriented x 3. pleasant and cooperative. Notes Currently patient's wound actually does not even require sharp debridement due to the fact that she has been tolerating the Hydrofera Blue Dressing and the slough that was noted on the  surface of the wound was mechanically debrided utilizing saline and gauze without complication today. Post debridement the wound appear to be doing excellent. Electronic Signature(s) Signed: 05/09/2017 3:59:23 PM By: Worthy Keeler PA-C Entered By: Worthy Keeler on 05/09/2017 11:55:07 [place], [name] ([id number]) -------------------------------------------------------------------------------- Physician Orders Details Patient Name: [name] Date of Service: 05/09/2017 11:15 AM Medical Record Number: [id number] Patient Account Number: [id number] Date of Birth/Sex: October 24, 1965 (52 y.o. F) Treating RN: Roger Shelter Primary Care Provider: Merrie Roof Other Clinician: Referring Provider: Merrie Roof Treating Provider/Extender: Melburn Hake, Maycel Riffe Weeks in Treatment: 53 Verbal / Phone Orders: No Diagnosis Coding ICD-10 Coding Code Description [phone] Non-pressure chronic ulcer of right calf with fat layer exposed I87.331 Chronic venous hypertension (idiopathic) with ulcer and inflammation of right lower extremity L03.115 Cellulitis of right lower limb Wound Cleansing Wound #1 Right,Anterior Lower Leg o Clean wound with Normal Saline. Anesthetic (add to Medication List) Wound #1 Right,Anterior Lower Leg o Topical Lidocaine 4% cream applied to wound bed prior to debridement (In Clinic Only). Primary Wound Dressing Wound #1 Right,Anterior Lower Leg o Hydrafera Blue Ready Transfer Secondary Dressing Wound #1 Right,Anterior Lower Leg o Boardered Foam Dressing Dressing Change Frequency Wound #1 Right,Anterior Lower Leg o Change dressing every other day. Follow-up Appointments o Return Appointment in 2 weeks. Additional Orders / Instructions o Vitamin A; Vitamin C, Zinc o Increase protein intake. Electronic Signature(s) Signed: 05/09/2017 3:59:23 PM By: Worthy Keeler PA-C Signed: 05/09/2017 4:13:06 PM By: Roger Shelter Entered By: Roger Shelter on 05/09/2017  11:50:41 [name], [name] ([id number]) -------------------------------------------------------------------------------- Problem List Details Patient Name: [name] Date of Service: 05/09/2017 11:15 AM Medical Record Number: [id number] Patient Account Number: [id number] Date of Birth/Sex: 1965/09/17 (51 y.o. F) Treating RN: Roger Shelter Primary Care Provider: Merrie Roof Other Clinician: Referring Provider: Merrie Roof Treating Provider/Extender: Melburn Hake, Axl Rodino Weeks in Treatment: 16 Active Problems ICD-10 Impacting Encounter Code Description Active Date Wound Healing Diagnosis L97.212 Non-pressure chronic ulcer of right calf with fat layer exposed 01/12/2017 Yes I87.331 Chronic venous hypertension (idiopathic) with ulcer and 01/12/2017 Yes inflammation of right lower extremity L03.115 Cellulitis of right lower limb 01/12/2017 Yes Inactive Problems Resolved Problems Electronic Signature(s) Signed: 05/09/2017 3:59:23 PM By: Worthy Keeler PA-C Entered By: Worthy Keeler on 05/09/2017 11:26:57 [name], [name] ([id number]) -------------------------------------------------------------------------------- Progress Note Details Patient Name: [name] Date of Service: 05/09/2017 11:15 AM Medical Record Number: [id number] Patient Account Number: [id number] Date of Birth/Sex: 1965/12/15 (52 y.o. F) Treating RN: Roger Shelter Primary Care Provider: Merrie Roof Other Clinician: Referring Provider: Merrie Roof Treating Provider/Extender: Melburn Hake, Taesha Goodell Weeks in Treatment: 16 Subjective Chief Complaint Information obtained from Patient Right LE Ulcer History of Present Illness (HPI) 01/12/17; this is a 52 year old nondiabetic remote ex-smoker. She tells me that sometime in early November she developed a sudden painful area on her right medial lower leg while she was in bed at night she reached down and scratch the area and woke up in the morning with a scab small wound.  This gradually got worse over time. She was seen in the ER on 12/18/16. At that point the possibility of an insect bite/brown recluse spider bite was brought up. At that point  it was 1 cm eschar covered wound. She was treated with Bactrim DS. She was back in the ER on 01/09/17 with increasing size pain of the wound. At this point the patient states it was covered with a black eschar at surface. An x-ray showed punctate radio density material within the wound but no osteomyelitis. 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 She was put on clindamycin. The patient was seen on 12/7 by vascular surgery. At that point noting a 3 x 3 cm necrotic wound. ABIs were ordered. She was treated with Silvadene cream. I don't see the results of the ABIs at this point. She is using Silvadene cream. A comprehensive metabolic panel CBC and differential PT and PTT were done which were also normal. 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 COPD/asthma/depression and a history of atrial fib. She tells me she had a history of vein stripping many years ago ABIs in our clinic were noncompressible on the right at 1.59 01/19/17; right medial lower leg wound. Small wound with some depth. Still requiring debridement we are using Santyl. She has vascular studies on January 9 but at this point I can't tell whether these are arterial venous or both. They are at [facility] 01/27/16; right medial lower leg wound. She has arterial and venous studies later this month. The wound looks somewhat better after debridement. We've been using Santyl. 02/08/17 on evaluation today patient has had for arterial studies performed. This showed noncompressible measurements in regard to the ABI although she did have tried phasic flow and it stated in the report that she did have good TBI's. With that being said I see no documentation of TBI's and I cannot find the final report in epic only see what  the patient brought in to show me today. We obviously are going to need to contact the office to see about getting the final report. We have been using Santyl and this does seem to be helping the wound to a degree. Patient is having no significant pain. 03/21/17 on evaluation today patient appears to be a little bit larger in regard to her ulcer. It has actually been almost a month since we have last seen her. She states that she has continued to use the [place] during that time. With that being said she is noting that she does have "pus" noted on the surface of the wound. Upon further inspection it actually appears that this is more slough and not pass noted at this point. There is no erythema surrounding the wound and nothing that would suggest that she is having an infection at this point. Her blood pressure is significantly high at this time. She states that she is just about out of blood pressure medication obviously we did tell her we cannot prescribed this needs to see her primary care provider as soon as possible. 04/04/17 on evaluation today patient states that over the past week she's been having a lot of significant burning with her dressings. We did switch her to the Iodoflex recently and I believe this may be the reason that she's been experiencing the burning type sensations. Nonetheless she really did not tell me about this as much last week and even over the past week she did not call list in order to tell us that she was having any issues. Nonetheless the wound does appear to be better. [name], [name] ([id number]) 04/11/17 on evaluation today patient actually appears to be doing fairly  well in regard to her ulcer. There is some Slough noted on the surface of the wound but no evidence of infection, erythema, or significant pain. She does tell us that she received a new blood pressure medication from her primary care physician on Friday however she has not picked that up as of yet. She is  actually going to pick it up when she leaves here today. Nonetheless her blood pressure was significantly high unfortunately today. The good news is her dressing does not seem to be causing her any difficulties like it did with the Iodoflex. 04/25/17 on evaluation today patient appears to be doing very well in regard to her right lower extremity ulcer. She has been tolerating the dressing changes without complication. At this point she states that she is having no significant pain in regard to the ulcer. With that being said she does have some issues with her left first metatarsal distally where she recently has been dealing with gout and she also recently had the flu she was in the hospital for these two issues. Fortunately both appear to be doing much better at this point. Again her wound is showing signs of improvement compared to previous weeks measurements. 05/09/17 on evaluation today patient appears to be doing excellent in regard to her right lower from the ulcer. She is still an excellent signs of progress with great epithelialization and the hyper granulation seems to be doing much better. Fortunately there does not appear to be any evidence of infection at this time. Patient History Information obtained from Patient. Family History Cancer - Siblings, No family history of Diabetes, Heart Disease, Hypertension, Kidney Disease, Lung Disease, Seizures, Stroke, Thyroid Problems, Tuberculosis. Social History Former smoker - stopped 6 years ago, Marital Status - Separated, Alcohol Use - Rarely, Drug Use - No History, Caffeine Use - Moderate. Review of Systems (ROS) Constitutional Symptoms (General Health) Denies complaints or symptoms of Fever, Chills. Respiratory The patient has no complaints or symptoms. Cardiovascular The patient has no complaints or symptoms. Psychiatric The patient has no complaints or symptoms. Objective Constitutional Well-nourished and well-hydrated in no  acute distress. Vitals Time Taken: 11:22 AM, Height: 70 in, Weight: 225 lbs, BMI: 32.3, Temperature: 97.9 F, Pulse: 70 bpm, Respiratory Rate: 18 breaths/min, Blood Pressure: 132/88 mmHg. Respiratory [name], [name] ([id number]) normal breathing without difficulty. clear to auscultation bilaterally. Cardiovascular regular rate and rhythm with normal S1, S2. Psychiatric this patient is able to make decisions and demonstrates good insight into disease process. Alert and Oriented x 3. pleasant and cooperative. General Notes: Currently patient's wound actually does not even require sharp debridement due to the fact that she has been tolerating the Hydrofera Blue Dressing and the slough that was noted on the surface of the wound was mechanically debrided utilizing saline and gauze without complication today. Post debridement the wound appear to be doing excellent. Integumentary (Hair, Skin) Wound #1 status is Open. Original cause of wound was Trauma. The wound is located on the Right,Anterior Lower Leg. The wound measures 1.2cm length x 1.2cm width x 0.1cm depth; 1.131cm^2 area and 0.113cm^3 volume. There is Fat Layer (Subcutaneous Tissue) Exposed exposed. There is no tunneling or undermining noted. There is a large amount of serous drainage noted. The wound margin is flat and intact. There is large (67-100%) pink granulation within the wound bed. There is a small (1-33%) amount of necrotic tissue within the wound bed including Adherent Slough. The periwound skin appearance exhibited: Hemosiderin Staining. The periwound skin appearance did not  exhibit: Callus, Crepitus, Excoriation, Induration, Rash, Scarring, Dry/Scaly, Maceration, Atrophie Blanche, Cyanosis, Ecchymosis, Mottled, Pallor, Rubor, Erythema. Periwound temperature was noted as No Abnormality. The periwound has tenderness on palpation. Assessment Active Problems ICD-10 L97.212 - Non-pressure chronic ulcer of right calf with fat layer  exposed I87.331 - Chronic venous hypertension (idiopathic) with ulcer and inflammation of right lower extremity L03.115 - Cellulitis of right lower limb Plan Wound Cleansing: Wound #1 Right,Anterior Lower Leg: Clean wound with Normal Saline. Anesthetic (add to Medication List): Wound #1 Right,Anterior Lower Leg: Topical Lidocaine 4% cream applied to wound bed prior to debridement (In Clinic Only). Primary Wound Dressing: Wound #1 Right,Anterior Lower Leg: Hydrafera Blue Ready Transfer Secondary Dressing: Wound #1 Right,Anterior Lower Leg: Boardered Foam Dressing Dressing Change Frequency: Wound #1 Right,Anterior Lower Leg: Change dressing every other day. [place], [name] ([id number]) Follow-up Appointments: Return Appointment in 2 weeks. Additional Orders / Instructions: Vitamin A; Vitamin C, Zinc Increase protein intake. I'm going to recommend at this point in time that we continue with the Current wound care measures. Patient is in agreement with plan. We will see her for reevaluation in one week to see were things stand. Please see above for specific wound care orders. We will see patient for re-evaluation in 1 week(s) here in the clinic. If anything worsens or changes patient will contact our office for additional recommendations. Electronic Signature(s) Signed: 05/09/2017 3:59:23 PM By: Worthy Keeler PA-C Entered By: Worthy Keeler on 05/09/2017 11:55:32 [name], [name] ([id number]) -------------------------------------------------------------------------------- ROS/PFSH Details Patient Name: [name] Date of Service: 05/09/2017 11:15 AM Medical Record Number: [id number] Patient Account Number: [id number] Date of Birth/Sex: [birth year] (52 y.o. F) Treating RN: Roger Shelter Primary Care Provider: Merrie Roof Other Clinician: Referring Provider: Merrie Roof Treating Provider/Extender: Melburn Hake, Christell Steinmiller Weeks in Treatment: 16 Information Obtained From Patient Wound  History Do you currently have one or more open woundso Yes How many open wounds do you currently haveo 1 Approximately how long have you had your woundso one month How have you been treating your wound(s) until nowo 3 weeks Has your wound(s) ever healed and then re-openedo Yes Have you had any lab work done in the past montho Yes Have you tested positive for an antibiotic resistant organism (MRSA, VRE)o No Have you tested positive for osteomyelitis (bone infection)o No Have you had any tests for circulation on your legso Yes Where was the test doneo  [facility] Have you had other problems associated with your woundso Infection Constitutional Symptoms (General Health) Complaints and Symptoms: Negative for: Fever; Chills Eyes Medical History: Negative for: Cataracts; Glaucoma; Optic Neuritis Ear/Nose/Mouth/Throat Medical History: Negative for: Chronic sinus problems/congestion; Middle ear problems Hematologic/Lymphatic Medical History: Negative for: Anemia; Hemophilia; Human Immunodeficiency Virus; Lymphedema; Sickle Cell Disease Respiratory Complaints and Symptoms: No Complaints or Symptoms Medical History: Positive for: Chronic Obstructive Pulmonary Disease (COPD); Sleep Apnea Negative for: Aspiration; Asthma; Pneumothorax; Tuberculosis Cardiovascular Complaints and Symptoms: No Complaints or Symptoms [name], [name] ([id number]) Medical History: Positive for: Arrhythmia; Congestive Heart Failure; Deep Vein Thrombosis - in lower legs and history of varicose veins; Hypertension Negative for: Angina; Coronary Artery Disease; Hypotension; Myocardial Infarction; Peripheral Arterial Disease; Peripheral Venous Disease; Phlebitis; Vasculitis Gastrointestinal Medical History: Negative for: Cirrhosis ; Colitis; Crohnos; Hepatitis A; Hepatitis B; Hepatitis C Endocrine Medical History: Negative for: Type I Diabetes; Type II Diabetes Genitourinary Medical History: Negative for: End  Stage Renal Disease Immunological Medical History: Negative for: Lupus Erythematosus; Raynaudos; Scleroderma Integumentary (Skin) Medical History: Negative for: History of Burn; History of  pressure wounds Musculoskeletal Medical History: Positive for: Gout; Rheumatoid Arthritis; Osteoarthritis Negative for: Osteomyelitis Neurologic Medical History: Negative for: Dementia; Neuropathy; Quadriplegia; Paraplegia; Seizure Disorder Oncologic Medical History: Negative for: Received Chemotherapy; Received Radiation Psychiatric Complaints and Symptoms: No Complaints or Symptoms Medical History: Positive for: Confinement Anxiety Negative for: Anorexia/bulimia Immunizations Pneumococcal Vaccine: Received Pneumococcal Vaccination: No [name], [name] ([id number]) Implantable Devices Family and Social History Cancer: Yes - Siblings; Diabetes: No; Heart Disease: No; Hypertension: No; Kidney Disease: No; Lung Disease: No; Seizures: No; Stroke: No; Thyroid Problems: No; Tuberculosis: No; Former smoker - stopped 6 years ago; Marital Status - Separated; Alcohol Use: Rarely; Drug Use: No History; Caffeine Use: Moderate; Financial Concerns: No; Food, Clothing or Shelter Needs: No; Support System Lacking: No; Transportation Concerns: No; Advanced Directives: No; Patient does not want information on Advanced Directives; Do not resuscitate: No; Living Will: No; Medical Power of Attorney: No Physician Affirmation I have reviewed and agree with the above information. Electronic Signature(s) Signed: 05/09/2017 3:59:23 PM By: Worthy Keeler PA-C Signed: 05/09/2017 4:13:06 PM By: Roger Shelter Entered By: Worthy Keeler on 05/09/2017 11:54:49 [name], [name] ([id number]) -------------------------------------------------------------------------------- SuperBill Details Patient Name: [name] Date of Service: 05/09/2017 Medical Record Number: [id number] Patient Account Number: [id number] Date of  Birth/Sex: 01-15-1966 (52 y.o. F) Treating RN: Roger Shelter Primary Care Provider: Merrie Roof Other Clinician: Referring Provider: Merrie Roof Treating Provider/Extender: Melburn Hake, Kathelene Rumberger Weeks in Treatment: 16 Diagnosis Coding ICD-10 Codes Code Description [phone] Non-pressure chronic ulcer of right calf with fat layer exposed I87.331 Chronic venous hypertension (idiopathic) with ulcer and inflammation of right lower extremity L03.115 Cellulitis of right lower limb Facility Procedures CPT4 Code: [id number] Description: 94709 - WOUND CARE VISIT-LEV 2 EST PT Modifier: Quantity: 1 Physician Procedures CPT4: Description Modifier Quantity Code [id number] 94765 - WC PHYS LEVEL 3 - EST PT 1 ICD-10 Diagnosis Description Y65.035 Non-pressure chronic ulcer of right calf with fat layer exposed I87.331 Chronic venous hypertension (idiopathic) with ulcer and  inflammation of right lower extremity L03.115 Cellulitis of right lower limb Electronic Signature(s) Signed: 05/09/2017 3:59:23 PM By: Worthy Keeler PA-C Entered By: Worthy Keeler on 05/09/2017 11:55:49

## 2017-05-13 NOTE — Progress Notes (Signed)
[name], [name] ([id number]) Visit Report for 05/09/2017 Arrival Information Details Patient Name: [name], [name] Date of Service: 05/09/2017 11:15 AM Medical Record Number: [id number] Patient Account Number: [id number] Date of Birth/Sex: [birth year] (52 y.o. F) Treating RN: Montey Hora Primary Care Hitomi Slape: Merrie Roof Other Clinician: Referring Avaneesh Pepitone: Merrie Roof Treating Ammiel Guiney/Extender: Melburn Hake, HOYT Weeks in Treatment: 16 Visit Information History Since Last Visit Added or deleted any medications: No Patient Arrived: Cane Any new allergies or adverse reactions: No Arrival Time: 11:21 Had a fall or experienced change in No Accompanied By: self activities of daily living that may affect Transfer Assistance: None risk of falls: Patient Identification Verified: Yes Signs or symptoms of abuse/neglect since last visito No Secondary Verification Process Completed: Yes Hospitalized since last visit: No Patient Requires Transmission-Based Precautions: No Implantable device outside of the clinic excluding No Patient Has Alerts: No cellular tissue based products placed in the center since last visit: Has Dressing in Place as Prescribed: Yes Pain Present Now: No Electronic Signature(s) Signed: 05/09/2017 4:05:33 PM By: Montey Hora Entered By: Montey Hora on 05/09/2017 11:21:27 [name] ([id number]) -------------------------------------------------------------------------------- Clinic Level of Care Assessment Details Patient Name: [name] Date of Service: 05/09/2017 11:15 AM Medical Record Number: [id number] Patient Account Number: [id number] Date of Birth/Sex: [birth year] (52 y.o. F) Treating RN: Roger Shelter Primary Care Yousuf Ager: Merrie Roof Other Clinician: Referring Romanda Turrubiates: Merrie Roof Treating Emmamae Mcnamara/Extender: Melburn Hake, HOYT Weeks in Treatment: 16 Clinic Level of Care Assessment Items TOOL 4 Quantity Score X - Use when only an EandM is  performed on FOLLOW-UP visit 1 0 ASSESSMENTS - Nursing Assessment / Reassessment X - Reassessment of Co-morbidities (includes updates in patient status) 1 10 X- 1 5 Reassessment of Adherence to Treatment Plan ASSESSMENTS - Wound and Skin Assessment / Reassessment X - Simple Wound Assessment / Reassessment - one wound 1 5 []  - 0 Complex Wound Assessment / Reassessment - multiple wounds []  - 0 Dermatologic / Skin Assessment (not related to wound area) ASSESSMENTS - Focused Assessment []  - Circumferential Edema Measurements - multi extremities 0 []  - 0 Nutritional Assessment / Counseling / Intervention []  - 0 Lower Extremity Assessment (monofilament, tuning fork, pulses) []  - 0 Peripheral Arterial Disease Assessment (using hand held doppler) ASSESSMENTS - Ostomy and/or Continence Assessment and Care []  - Incontinence Assessment and Management 0 []  - 0 Ostomy Care Assessment and Management (repouching, etc.) PROCESS - Coordination of Care X - Simple Patient / Family Education for ongoing care 1 15 []  - 0 Complex (extensive) Patient / Family Education for ongoing care []  - 0 Staff obtains Programmer, systems, Records, Test Results / Process Orders []  - 0 Staff telephones HHA, Nursing Homes / Clarify orders / etc []  - 0 Routine Transfer to another Facility (non-emergent condition) []  - 0 Routine Hospital Admission (non-emergent condition) []  - 0 New Admissions / Biomedical engineer / Ordering NPWT, Apligraf, etc. []  - 0 Emergency Hospital Admission (emergent condition) X- 1 10 Simple Discharge Coordination [name] ([id number]) []  - 0 Complex (extensive) Discharge Coordination PROCESS - Special Needs []  - Pediatric / Minor Patient Management 0 []  - 0 Isolation Patient Management []  - 0 Hearing / Language / Visual special needs []  - 0 Assessment of Community assistance (transportation, D/C planning, etc.) []  - 0 Additional assistance / Altered mentation []  - 0 Support  Surface(s) Assessment (bed, cushion, seat, etc.) INTERVENTIONS - Wound Cleansing / Measurement X - Simple Wound Cleansing - one wound 1 5 []  - 0 Complex Wound Cleansing - multiple wounds  X- 1 5 Wound Imaging (photographs - any number of wounds) []  - 0 Wound Tracing (instead of photographs) X- 1 5 Simple Wound Measurement - one wound []  - 0 Complex Wound Measurement - multiple wounds INTERVENTIONS - Wound Dressings X - Small Wound Dressing one or multiple wounds 1 10 []  - 0 Medium Wound Dressing one or multiple wounds []  - 0 Large Wound Dressing one or multiple wounds []  - 0 Application of Medications - topical []  - 0 Application of Medications - injection INTERVENTIONS - Miscellaneous []  - External ear exam 0 []  - 0 Specimen Collection (cultures, biopsies, blood, body fluids, etc.) []  - 0 Specimen(s) / Culture(s) sent or taken to Lab for analysis []  - 0 Patient Transfer (multiple staff / Civil Service fast streamer / Similar devices) []  - 0 Simple Staple / Suture removal (25 or less) []  - 0 Complex Staple / Suture removal (26 or more) []  - 0 Hypo / Hyperglycemic Management (close monitor of Blood Glucose) []  - 0 Ankle / Brachial Index (ABI) - do not check if billed separately X- 1 5 Vital Signs [name] ([id number]) Has the patient been seen at the hospital within the last three years: Yes Total Score: 75 Level Of Care: New/Established - Level 2 Electronic Signature(s) Signed: 05/09/2017 4:13:06 PM By: Roger Shelter Entered By: Roger Shelter on 05/09/2017 11:51:11 [name] ([id number]) -------------------------------------------------------------------------------- Encounter Discharge Information Details Patient Name: [name] Date of Service: 05/09/2017 11:15 AM Medical Record Number: [id number] Patient Account Number: [id number] Date of Birth/Sex: October 01, 1965 (52 y.o. F) Treating RN: Roger Shelter Primary Care August Longest: Merrie Roof Other  Clinician: Referring Yalena Colon: Merrie Roof Treating Taci Sterling/Extender: Melburn Hake, HOYT Weeks in Treatment: 16 Encounter Discharge Information Items Discharge Pain Level: 0 Discharge Condition: Stable Ambulatory Status: Ambulatory Discharge Destination: Home Private Transportation: Auto Accompanied By: friend Schedule Follow-up Appointment: Yes Medication Reconciliation completed and provided No to Patient/Care Dejia Ebron: Clinical Summary of Care: Electronic Signature(s) Signed: 05/09/2017 4:13:06 PM By: Roger Shelter Entered By: Roger Shelter on 05/09/2017 11:55:44 [place], Neoma Randall ([id number]) -------------------------------------------------------------------------------- Lower Extremity Assessment Details Patient Name: [name] Date of Service: 05/09/2017 11:15 AM Medical Record Number: [id number] Patient Account Number: [id number] Date of Birth/Sex: Mar 28, 1965 (52 y.o. F) Treating RN: Montey Hora Primary Care Xadrian Craighead: Merrie Roof Other Clinician: Referring Lowry Bala: Merrie Roof Treating Burhan Barham/Extender: Melburn Hake, HOYT Weeks in Treatment: 16 Vascular Assessment Pulses: Dorsalis Pedis Palpable: [Right:Yes] Posterior Tibial Extremity colors, hair growth, and conditions: Extremity Color: [Right:Normal] Hair Growth on Extremity: [Right:No] Temperature of Extremity: [Right:Warm] Capillary Refill: [Right:< 3 seconds] Toe Nail Assessment Left: Right: Thick: Yes Discolored: No Deformed: No Improper Length and Hygiene: No Electronic Signature(s) Signed: 05/09/2017 4:05:33 PM By: Montey Hora Entered By: Montey Hora on 05/09/2017 11:28:41 [place], Neoma Randall ([id number]) -------------------------------------------------------------------------------- Multi Wound Chart Details Patient Name: [name] Date of Service: 05/09/2017 11:15 AM Medical Record Number: [id number] Patient Account Number: [id number] Date of Birth/Sex: [birth year] (52 y.o.  F) Treating RN: Roger Shelter Primary Care Clydean Posas: Merrie Roof Other Clinician: Referring Casilda Pickerill: Merrie Roof Treating Mariea Mcmartin/Extender: Melburn Hake, HOYT Weeks in Treatment: 16 Vital Signs Height(in): 70 Pulse(bpm): 70 Weight(lbs): 225 Blood Pressure(mmHg): 132/88 Body Mass Index(BMI): 32 Temperature(F): 97.9 Respiratory Rate 18 (breaths/min): Photos: [1:No Photos] [N/A:N/A] Wound Location: [1:Right Lower Leg - Anterior] [N/A:N/A] Wounding Event: [1:Trauma] [N/A:N/A] Primary Etiology: [1:Trauma, Other] [N/A:N/A] Comorbid History: [1:Chronic Obstructive Pulmonary Disease (COPD), Sleep Apnea, Arrhythmia, Congestive Heart Failure, Deep Vein Thrombosis, Hypertension, Gout, Rheumatoid Arthritis, Osteoarthritis, Confinement Anxiety] [N/A:N/A] Date Acquired: [1:12/09/2016] [N/A:N/A] Weeks of Treatment: [1:16] [N/A:N/A] Wound  Status: [1:Open] [N/A:N/A] Measurements L x W x D [1:1.2x1.2x0.1] [N/A:N/A] (cm) Area (cm) : [1:1.131] [N/A:N/A] Volume (cm) : [1:0.113] [N/A:N/A] % Reduction in Area: [1:47.10%] [N/A:N/A] % Reduction in Volume: [1:82.40%] [N/A:N/A] Classification: [1:Full Thickness Without Exposed Support Structures] [N/A:N/A] Exudate Amount: [1:Large] [N/A:N/A] Exudate Type: [1:Serous] [N/A:N/A] Exudate Color: [1:amber] [N/A:N/A] Wound Margin: [1:Flat and Intact] [N/A:N/A] Granulation Amount: [1:Large (67-100%)] [N/A:N/A] Granulation Quality: [1:Pink] [N/A:N/A] Necrotic Amount: [1:Small (1-33%)] [N/A:N/A] Exposed Structures: [1:Fat Layer (Subcutaneous Tissue) Exposed: Yes Fascia: No Tendon: No Muscle: No] [N/A:N/A] Joint: No Bone: No Epithelialization: Small (1-33%) N/A N/A Periwound Skin Texture: Excoriation: No N/A N/A Induration: No Callus: No Crepitus: No Rash: No Scarring: No Periwound Skin Moisture: Maceration: No N/A N/A Dry/Scaly: No Periwound Skin Color: Hemosiderin Staining: Yes N/A N/A Atrophie Blanche: No Cyanosis: No Ecchymosis:  No Erythema: No Mottled: No Pallor: No Rubor: No Temperature: No Abnormality N/A N/A Tenderness on Palpation: Yes N/A N/A Wound Preparation: Ulcer Cleansing: N/A N/A Rinsed/Irrigated with Saline Topical Anesthetic Applied: Other: lidocaine 4% Treatment Notes Electronic Signature(s) Signed: 05/09/2017 4:13:06 PM By: Roger Shelter Entered By: Roger Shelter on 05/09/2017 11:47:21 [place], Neoma Randall ([id number]) -------------------------------------------------------------------------------- [place] Details Patient Name: [name] Date of Service: 05/09/2017 11:15 AM Medical Record Number: [id number] Patient Account Number: [id number] Date of Birth/Sex: Jun 30, 1965 (52 y.o. F) Treating RN: Roger Shelter Primary Care Vicky Mccanless: Merrie Roof Other Clinician: Referring Richardo Popoff: Merrie Roof Treating Quinnlan Abruzzo/Extender: Melburn Hake, HOYT Weeks in Treatment: 16 Active Inactive ` Orientation to the Wound Care Program Nursing Diagnoses: Knowledge deficit related to the wound healing center program Goals: Patient/caregiver will verbalize understanding of the Green Program Date Initiated: 01/12/2017 Target Resolution Date: 02/13/2016 Goal Status: Active Interventions: Provide education on orientation to the wound center Notes: ` Wound/Skin Impairment Nursing Diagnoses: Impaired tissue integrity Knowledge deficit related to ulceration/compromised skin integrity Goals: Patient/caregiver will verbalize understanding of skin care regimen Date Initiated: 01/12/2017 Target Resolution Date: 02/12/2017 Goal Status: Active Ulcer/skin breakdown will have a volume reduction of 30% by week 4 Date Initiated: 01/12/2017 Target Resolution Date: 02/12/2017 Goal Status: Active Interventions: Assess patient/caregiver ability to obtain necessary supplies Assess ulceration(s) every visit Provide education on ulcer and skin care Treatment Activities: Skin  care regimen initiated : 01/12/2017 Notes: Electronic Signature(s) Signed: 05/09/2017 4:13:06 PM By: Viona Gilmore, Neoma Randall ([id number]) Entered By: Roger Shelter on 05/09/2017 11:47:10 [place], Neoma Randall ([id number]) -------------------------------------------------------------------------------- Pain Assessment Details Patient Name: [name] Date of Service: 05/09/2017 11:15 AM Medical Record Number: [id number] Patient Account Number: [id number] Date of Birth/Sex: 1965/09/27 (52 y.o. F) Treating RN: Montey Hora Primary Care Dameisha Tschida: Merrie Roof Other Clinician: Referring Pleasant Bensinger: Merrie Roof Treating Gurtej Noyola/Extender: Melburn Hake, HOYT Weeks in Treatment: 16 Active Problems Location of Pain Severity and Description of Pain Patient Has Paino No Site Locations Pain Management and Medication Current Pain Management: Electronic Signature(s) Signed: 05/09/2017 4:05:33 PM By: Montey Hora Entered By: Montey Hora on 05/09/2017 11:22:20 [name] ([id number]) -------------------------------------------------------------------------------- Patient/Caregiver Education Details Patient Name: [name] Date of Service: 05/09/2017 11:15 AM Medical Record Number: [id number] Patient Account Number: [id number] Date of Birth/Gender: 02-04-1965 (52 y.o. F) Treating RN: Roger Shelter Primary Care Physician: Merrie Roof Other Clinician: Referring Physician: Merrie Roof Treating Physician/Extender: Sharalyn Ink in Treatment: 16 Education Assessment Education Provided To: Patient Education Topics Provided Wound/Skin Impairment: Handouts: Caring for Your Ulcer, Other: change dressing as ordered Methods: Demonstration, Explain/Verbal Responses: State content correctly Electronic Signature(s) Signed: 05/09/2017 4:13:06 PM By: Roger Shelter Entered By: Roger Shelter on 05/09/2017 11:55:59 [place],  [facility]  ([id number]) -------------------------------------------------------------------------------- Wound Assessment Details Patient Name: [name], [name] Date of Service: 05/09/2017 11:15 AM Medical Record Number: [id number] Patient Account Number: [id number] Date of Birth/Sex: 1965/09/22 (52 y.o. F) Treating RN: Montey Hora Primary Care Noe Goyer: Merrie Roof Other Clinician: Referring Bobbye Reinitz: Merrie Roof Treating Anwitha Mapes/Extender: Melburn Hake, HOYT Weeks in Treatment: 16 Wound Status Wound Number: 1 Primary Trauma, Other Etiology: Wound Location: Right Lower Leg - Anterior Wound Open Wounding Event: Trauma Status: Date Acquired: 12/09/2016 Comorbid Chronic Obstructive Pulmonary Disease (COPD), Weeks Of Treatment: 16 History: Sleep Apnea, Arrhythmia, Congestive Heart Clustered Wound: No Failure, Deep Vein Thrombosis, Hypertension, Gout, Rheumatoid Arthritis, Osteoarthritis, Confinement Anxiety Photos Photo Uploaded By: Montey Hora on 05/09/2017 15:18:41 Wound Measurements Length: (cm) 1.2 Width: (cm) 1.2 Depth: (cm) 0.1 Area: (cm) 1.131 Volume: (cm) 0.113 % Reduction in Area: 47.1% % Reduction in Volume: 82.4% Epithelialization: Small (1-33%) Tunneling: No Undermining: No Wound Description Full Thickness Without Exposed Support Classification: Structures Wound Margin: Flat and Intact Exudate Large Amount: Exudate Type: Serous Exudate Color: amber Foul Odor After Cleansing: No Slough/Fibrino Yes Wound Bed Granulation Amount: Large (67-100%) Exposed Structure Granulation Quality: Pink Fascia Exposed: No Necrotic Amount: Small (1-33%) Fat Layer (Subcutaneous Tissue) Exposed: Yes Necrotic Quality: Adherent Slough Tendon Exposed: No Muscle Exposed: No Sobieski, Roan ([id number]) Joint Exposed: No Bone Exposed: No Periwound Skin Texture Texture Color No Abnormalities Noted: No No Abnormalities Noted: No Callus: No Atrophie Blanche: No Crepitus:  No Cyanosis: No Excoriation: No Ecchymosis: No Induration: No Erythema: No Rash: No Hemosiderin Staining: Yes Scarring: No Mottled: No Pallor: No Moisture Rubor: No No Abnormalities Noted: No Dry / Scaly: No Temperature / Pain Maceration: No Temperature: No Abnormality Tenderness on Palpation: Yes Wound Preparation Ulcer Cleansing: Rinsed/Irrigated with Saline Topical Anesthetic Applied: Other: lidocaine 4%, Treatment Notes Wound #1 (Right, Anterior Lower Leg) 1. Cleansed with: Clean wound with Normal Saline 2. Anesthetic Topical Lidocaine 4% cream to wound bed prior to debridement 4. Dressing Applied: Hydrafera Blue 5. Secondary Dressing Applied Bordered Foam Dressing Electronic Signature(s) Signed: 05/09/2017 4:05:33 PM By: Montey Hora Entered By: Montey Hora on 05/09/2017 11:28:21 [name] ([id number]) -------------------------------------------------------------------------------- Vitals Details Patient Name: [name] Date of Service: 05/09/2017 11:15 AM Medical Record Number: [id number] Patient Account Number: [id number] Date of Birth/Sex: 10-19-1965 (52 y.o. F) Treating RN: Montey Hora Primary Care Maxine Fredman: Merrie Roof Other Clinician: Referring Tyjai Charbonnet: Merrie Roof Treating Alessa Mazur/Extender: Melburn Hake, HOYT Weeks in Treatment: 16 Vital Signs Time Taken: 11:22 Temperature (F): 97.9 Height (in): 70 Pulse (bpm): 70 Weight (lbs): 225 Respiratory Rate (breaths/min): 18 Body Mass Index (BMI): 32.3 Blood Pressure (mmHg): 132/88 Reference Range: 80 - 120 mg / dl Electronic Signature(s) Signed: 05/09/2017 4:05:33 PM By: Montey Hora Entered By: Montey Hora on 05/09/2017 11:25:05

## 2017-05-23 ENCOUNTER — Ambulatory Visit: Payer: Medicaid Other | Admitting: Physician Assistant

## 2017-05-25 ENCOUNTER — Telehealth: Payer: Self-pay

## 2017-05-25 NOTE — Telephone Encounter (Signed)
I called and spoke with the patient's sister, Lennox Pippins, per the patient's request. She has been notified of the colonoscopy date and location.

## 2017-05-25 NOTE — Telephone Encounter (Signed)
Gastroenterology Pre-Procedure Review  Request Date: 06/06/17 Requesting Physician: Dr. Allen Norris  PATIENT REVIEW QUESTIONS: The patient responded to the following health history questions as indicated:    1. Are you having any GI issues? No  2. Do you have a personal history of Polyps? No  3. Do you have a family history of Colon Cancer or Polyps? Yes, Sister Colon Ca 4. Diabetes Mellitus? No  5. Joint replacements in the past 12 months? No  6. Major health problems in the past 3 months? No  7. Any artificial heart valves, MVP, or defibrillator? No, Hx of stroke     MEDICATIONS & ALLERGIES:    Patient reports the following regarding taking any anticoagulation/antiplatelet therapy:   Plavix, Coumadin, Eliquis, Xarelto, Lovenox, Pradaxa, Brilinta, or Effient? No  Aspirin? Yes   Patient confirms/reports the following medications:  Current Outpatient Medications  Medication Sig Dispense Refill  . allopurinol (ZYLOPRIM) 100 MG tablet Take 1 tablet (100 mg total) by mouth daily. 30 tablet 6  . amLODipine (NORVASC) 10 MG tablet Take 1 tablet (10 mg total) by mouth daily. 30 tablet 1  . aspirin EC 81 MG tablet Take 81 mg by mouth daily.    Marland Kitchen atorvastatin (LIPITOR) 20 MG tablet Take 1 tablet (20 mg total) by mouth daily. 30 tablet 1  . butalbital-acetaminophen-caffeine (FIORICET, ESGIC) 50-325-40 MG tablet Take 1-2 tablets by mouth every 6 (six) hours as needed for headache. 20 tablet 0  . cloNIDine (CATAPRES) 0.1 MG tablet Take 1 tablet (0.1 mg total) by mouth 2 (two) times daily. 60 tablet 6  . colchicine 0.6 MG tablet Take 1 tablet (0.6 mg total) by mouth 2 (two) times daily. As needed for gout flares 30 tablet 1  . diphenhydrAMINE (BENADRYL) 25 MG tablet Take 25 mg by mouth every 6 (six) hours as needed.    . furosemide (LASIX) 40 MG tablet Take 1 tablet (40 mg total) by mouth daily. 30 tablet 5  . hydrALAZINE (APRESOLINE) 50 MG tablet Take 1 tablet (50 mg total) by mouth 3 (three) times daily.  90 tablet 1  . lisinopril (PRINIVIL,ZESTRIL) 20 MG tablet Take 1 tablet (20 mg total) by mouth daily. 30 tablet 5  . metoprolol succinate (TOPROL-XL) 100 MG 24 hr tablet Take 1 tablet (100 mg total) by mouth daily. Take with or immediately following a meal. 30 tablet 1  . ondansetron (ZOFRAN ODT) 4 MG disintegrating tablet Take 1 tablet (4 mg total) by mouth every 8 (eight) hours as needed for nausea or vomiting. 20 tablet 0  . potassium chloride SA (KLOR-CON M20) 20 MEQ tablet Take 1 tablet (20 mEq total) by mouth daily. 30 tablet 5  . sertraline (ZOLOFT) 100 MG tablet Take 1 tablet (100 mg total) by mouth daily. 30 tablet 1  . spironolactone (ALDACTONE) 25 MG tablet Take 2 tablets (50 mg total) by mouth daily. 60 tablet 1   No current facility-administered medications for this visit.     Patient confirms/reports the following allergies:  Allergies  Allergen Reactions  . Morphine And Related Hives  . Penicillins Other (See Comments)    Painful urination Has patient had a PCN reaction causing immediate rash, facial/tongue/throat swelling, SOB or lightheadedness with hypotension: yes Has patient had a PCN reaction causing severe rash involving mucus membranes or skin necrosis: no Has patient had a PCN reaction that required hospitalization no Has patient had a PCN reaction occurring within the last 10 years: no If all of the above answers are "  NO", then may proceed with Cephalosporin use.     No orders of the defined types were placed in this encounter.   AUTHORIZATION INFORMATION Primary Insurance: 1D#: Group #:  Secondary Insurance: 1D#: Group #:  SCHEDULE INFORMATION: Date: 06/06/17 Time: Location: [place]

## 2017-05-26 ENCOUNTER — Other Ambulatory Visit: Payer: Self-pay

## 2017-05-26 DIAGNOSIS — Z1211 Encounter for screening for malignant neoplasm of colon: Secondary | ICD-10-CM

## 2017-05-26 MED ORDER — NA SULFATE-K SULFATE-MG SULF 17.5-3.13-1.6 GM/177ML PO SOLN: 1.0000 | Freq: Once | ORAL | 0 refills | Status: AC

## 2017-05-30 ENCOUNTER — Encounter: Payer: Self-pay | Admitting: Anesthesiology

## 2017-06-03 NOTE — Discharge Instructions (Signed)
General Anesthesia, Adult, Care After °These instructions provide you with information about caring for yourself after your procedure. Your health care provider may also give you more specific instructions. Your treatment has been planned according to current medical practices, but problems sometimes occur. Call your health care provider if you have any problems or questions after your procedure. °What can I expect after the procedure? °After the procedure, it is common to have: °· Vomiting. °· A sore throat. °· Mental slowness. ° °It is common to feel: °· Nauseous. °· Cold or shivery. °· Sleepy. °· Tired. °· Sore or achy, even in parts of your body where you did not have surgery. ° °Follow these instructions at home: °For at least 24 hours after the procedure: °· Do not: °? Participate in activities where you could fall or become injured. °? Drive. °? Use heavy machinery. °? Drink alcohol. °? Take sleeping pills or medicines that cause drowsiness. °? Make important decisions or sign legal documents. °? Take care of children on your own. °· Rest. °Eating and drinking °· If you vomit, drink water, juice, or soup when you can drink without vomiting. °· Drink enough fluid to keep your urine clear or pale yellow. °· Make sure you have little or no nausea before eating solid foods. °· Follow the diet recommended by your health care provider. °General instructions °· Have a responsible adult stay with you until you are awake and alert. °· Return to your normal activities as told by your health care provider. Ask your health care provider what activities are safe for you. °· Take over-the-counter and prescription medicines only as told by your health care provider. °· If you smoke, do not smoke without supervision. °· Keep all follow-up visits as told by your health care provider. This is important. °Contact a health care provider if: °· You continue to have nausea or vomiting at home, and medicines are not helpful. °· You  cannot drink fluids or start eating again. °· You cannot urinate after 8-12 hours. °· You develop a skin rash. °· You have fever. °· You have increasing redness at the site of your procedure. °Get help right away if: °· You have difficulty breathing. °· You have chest pain. °· You have unexpected bleeding. °· You feel that you are having a life-threatening or urgent problem. °This information is not intended to replace advice given to you by your health care provider. Make sure you discuss any questions you have with your health care provider. °Document Released: 04/19/2000 Document Revised: 06/16/2015 Document Reviewed: 12/26/2014 °Elsevier Interactive Patient Education © 2018 Elsevier Inc. ° °

## 2017-06-06 ENCOUNTER — Ambulatory Visit
Admission: RE | Admit: 2017-06-06 | Discharge: 2017-06-06 | Disposition: A | Discharge status: Home / Self Care | Payer: Medicaid Other | Source: Ambulatory Visit | Attending: Gastroenterology | Admitting: Gastroenterology

## 2017-06-06 ENCOUNTER — Encounter
Admission: RE | Disposition: A | Discharge status: Home / Self Care | Payer: Self-pay | Source: Ambulatory Visit | Attending: Gastroenterology

## 2017-06-06 DIAGNOSIS — Z5309 Procedure and treatment not carried out because of other contraindication: Secondary | ICD-10-CM | POA: Insufficient documentation

## 2017-06-06 DIAGNOSIS — Z7982 Long term (current) use of aspirin: Secondary | ICD-10-CM | POA: Diagnosis not present

## 2017-06-06 DIAGNOSIS — Z86711 Personal history of pulmonary embolism: Secondary | ICD-10-CM | POA: Insufficient documentation

## 2017-06-06 DIAGNOSIS — E669 Obesity, unspecified: Secondary | ICD-10-CM | POA: Insufficient documentation

## 2017-06-06 DIAGNOSIS — G3184 Mild cognitive impairment, so stated: Secondary | ICD-10-CM | POA: Diagnosis not present

## 2017-06-06 DIAGNOSIS — Z79899 Other long term (current) drug therapy: Secondary | ICD-10-CM | POA: Diagnosis not present

## 2017-06-06 DIAGNOSIS — I509 Heart failure, unspecified: Secondary | ICD-10-CM | POA: Diagnosis not present

## 2017-06-06 DIAGNOSIS — Z1211 Encounter for screening for malignant neoplasm of colon: Secondary | ICD-10-CM | POA: Diagnosis present

## 2017-06-06 DIAGNOSIS — M109 Gout, unspecified: Secondary | ICD-10-CM | POA: Diagnosis not present

## 2017-06-06 DIAGNOSIS — G4730 Sleep apnea, unspecified: Secondary | ICD-10-CM | POA: Diagnosis not present

## 2017-06-06 DIAGNOSIS — Z885 Allergy status to narcotic agent status: Secondary | ICD-10-CM | POA: Diagnosis not present

## 2017-06-06 DIAGNOSIS — Z880 Allergy status to penicillin: Secondary | ICD-10-CM | POA: Diagnosis not present

## 2017-06-06 DIAGNOSIS — I110 Hypertensive heart disease with heart failure: Secondary | ICD-10-CM | POA: Insufficient documentation

## 2017-06-06 DIAGNOSIS — K760 Fatty (change of) liver, not elsewhere classified: Secondary | ICD-10-CM | POA: Insufficient documentation

## 2017-06-06 DIAGNOSIS — K219 Gastro-esophageal reflux disease without esophagitis: Secondary | ICD-10-CM | POA: Insufficient documentation

## 2017-06-06 DIAGNOSIS — J449 Chronic obstructive pulmonary disease, unspecified: Secondary | ICD-10-CM | POA: Insufficient documentation

## 2017-06-06 DIAGNOSIS — Z87891 Personal history of nicotine dependence: Secondary | ICD-10-CM | POA: Diagnosis not present

## 2017-06-06 DIAGNOSIS — Z8673 Personal history of transient ischemic attack (TIA), and cerebral infarction without residual deficits: Secondary | ICD-10-CM | POA: Insufficient documentation

## 2017-06-06 DIAGNOSIS — Z6832 Body mass index (BMI) 32.0-32.9, adult: Secondary | ICD-10-CM | POA: Insufficient documentation

## 2017-06-06 SURGERY — COLONOSCOPY WITH PROPOFOL
Anesthesia: General

## 2017-06-06 MED ORDER — SODIUM CHLORIDE 0.9 % IV SOLN: INTRAVENOUS | Status: DC

## 2017-06-06 MED ORDER — LACTATED RINGERS IV SOLN: INTRAVENOUS | Status: DC

## 2017-06-06 SURGICAL SUPPLY — 24 items
CANISTER SUCT 1200ML W/VALVE (MISCELLANEOUS) ×3 IMPLANT
CLIP HMST 235XBRD CATH ROT (MISCELLANEOUS) IMPLANT
CLIP RESOLUTION 360 11X235 (MISCELLANEOUS)
ELECT REM PT RETURN 9FT ADLT (ELECTROSURGICAL)
ELECTRODE REM PT RTRN 9FT ADLT (ELECTROSURGICAL) IMPLANT
FCP ESCP3.2XJMB 240X2.8X (MISCELLANEOUS)
FORCEPS BIOP RAD 4 LRG CAP 4 (CUTTING FORCEPS) IMPLANT
FORCEPS BIOP RJ4 240 W/NDL (MISCELLANEOUS)
FORCEPS ESCP3.2XJMB 240X2.8X (MISCELLANEOUS) IMPLANT
GOWN CVR UNV OPN BCK APRN NK (MISCELLANEOUS) ×2 IMPLANT
GOWN ISOL THUMB LOOP REG UNIV (MISCELLANEOUS) ×4
INJECTOR VARIJECT VIN23 (MISCELLANEOUS) IMPLANT
KIT DEFENDO VALVE AND CONN (KITS) IMPLANT
KIT ENDO PROCEDURE OLY (KITS) ×3 IMPLANT
MARKER SPOT ENDO TATTOO 5ML (MISCELLANEOUS) IMPLANT
PROBE APC STR FIRE (PROBE) IMPLANT
RETRIEVER NET ROTH 2.5X230 LF (MISCELLANEOUS) IMPLANT
SNARE SHORT THROW 13M SML OVAL (MISCELLANEOUS) IMPLANT
SNARE SHORT THROW 30M LRG OVAL (MISCELLANEOUS) IMPLANT
SNARE SNG USE RND 15MM (INSTRUMENTS) IMPLANT
SPOT EX ENDOSCOPIC TATTOO (MISCELLANEOUS)
TRAP ETRAP POLY (MISCELLANEOUS) IMPLANT
VARIJECT INJECTOR VIN23 (MISCELLANEOUS)
WATER STERILE IRR 250ML POUR (IV SOLUTION) ×3 IMPLANT

## 2017-06-06 NOTE — H&P (Signed)
Betty Lame, MD [address] [place], [place] [address] Phone: [phone] Fax : [phone]  Primary Care Physician:  Volney American, [place] Primary Gastroenterologist:  Dr. Allen Norris  Pre-Procedure History & Physical: HPI:  [name] is a 52 y.o. female is here for a screening colonoscopy.   Past Medical History:  Diagnosis Date  . A-fib ([place]) 2010  . Adrenal adenoma, left   . Alcohol abuse   . Allergy   . CHF (congestive heart failure) ([place])   . COPD (chronic obstructive pulmonary disease) ([place])   . Fatty liver   . GERD (gastroesophageal reflux disease)   . Gout   . Hypertension   . Mild cognitive impairment   . Non-healing non-surgical wound    right leg  . Obesity   . Pulmonary embolus ([place])   . Sleep apnea    uses Cpap  . Stroke ([place])   . Thyroid disease     Past Surgical History:  Procedure Laterality Date  . bilateral wrist fractures  2010  . lt ankle fracture  2002    Prior to Admission medications   Medication Sig Start Date End Date Taking? Authorizing Provider  aspirin EC 81 MG tablet Take 81 mg by mouth daily.   Yes [provider]  diphenhydrAMINE (BENADRYL) 25 MG tablet Take 25 mg by mouth every 6 (six) hours as needed.   Yes [provider]  allopurinol (ZYLOPRIM) 100 MG tablet Take 1 tablet (100 mg total) by mouth daily. 05/09/17   Volney American, PA-C  amLODipine (NORVASC) 10 MG tablet Take 1 tablet (10 mg total) by mouth daily. 04/07/17   Volney American, PA-C  atorvastatin (LIPITOR) 20 MG tablet Take 1 tablet (20 mg total) by mouth daily. 04/07/17   Volney American, PA-C  butalbital-acetaminophen-caffeine [place], ESGIC) [phone] MG tablet Take 1-2 tablets by mouth every 6 (six) hours as needed for headache. 04/12/17 04/12/18  Loney Hering, MD  cloNIDine (CATAPRES) 0.1 MG tablet Take 1 tablet (0.1 mg total) by mouth 2 (two) times daily. 05/09/17   Volney American, PA-C    colchicine 0.6 MG tablet Take 1 tablet (0.6 mg total) by mouth 2 (two) times daily. As needed for gout flares 04/19/17   Volney American, PA-C  furosemide (LASIX) 40 MG tablet Take 1 tablet (40 mg total) by mouth daily. 03/24/17 06/22/17  Alisa Graff, FNP  hydrALAZINE (APRESOLINE) 50 MG tablet Take 1 tablet (50 mg total) by mouth 3 (three) times daily. 04/07/17   Volney American, PA-C  lisinopril (PRINIVIL,ZESTRIL) 20 MG tablet Take 1 tablet (20 mg total) by mouth daily. 03/24/17   Alisa Graff, FNP  metoprolol succinate (TOPROL-XL) 100 MG 24 hr tablet Take 1 tablet (100 mg total) by mouth daily. Take with or immediately following a meal. 04/07/17   Volney American, PA-C  ondansetron (ZOFRAN ODT) 4 MG disintegrating tablet Take 1 tablet (4 mg total) by mouth every 8 (eight) hours as needed for nausea or vomiting. 04/12/17   Loney Hering, MD  potassium chloride SA (KLOR-CON M20) 20 MEQ tablet Take 1 tablet (20 mEq total) by mouth daily. 03/24/17   Alisa Graff, FNP  sertraline (ZOLOFT) 100 MG tablet Take 1 tablet (100 mg total) by mouth daily. 04/07/17   Volney American, PA-C  spironolactone (ALDACTONE) 25 MG tablet Take 2 tablets (50 mg total) by mouth daily. 04/07/17   Volney American, PA-C  Allergies as of 05/26/2017 - Review Complete 05/09/2017  Allergen Reaction Noted  . Morphine and related Hives 02/16/2015  . Penicillins Other (See Comments) 02/16/2015    Family History  Problem Relation Age of Onset  . Prostate cancer Father   . Rectal cancer Sister   . Breast cancer Sister   . Cancer Maternal Grandmother   . Cancer Maternal Grandfather     Social History   Socioeconomic History  . Marital status: Legally Separated    Spouse name: Not on file  . Number of children: Not on file  . Years of education: Not on file  . Highest education level: Not on file  Occupational History  . Not on file  Social Needs  . Financial resource  strain: Not on file  . Food insecurity:    Worry: Not on file    Inability: Not on file  . Transportation needs:    Medical: Not on file    Non-medical: Not on file  Tobacco Use  . Smoking status: Former Smoker    Types: Cigarettes    Last attempt to quit: 01/26/2016    Years since quitting: 1.3  . Smokeless tobacco: Never Used  Substance and Sexual Activity  . Alcohol use: Not Currently    Comment: occ  . Drug use: No  . Sexual activity: Never  Lifestyle  . Physical activity:    Days per week: Not on file    Minutes per session: Not on file  . Stress: Not on file  Relationships  . Social connections:    Talks on phone: Not on file    Gets together: Not on file    Attends religious service: Not on file    Active member of club or organization: Not on file    Attends meetings of clubs or organizations: Not on file    Relationship status: Not on file  . Intimate partner violence:    Fear of current or ex partner: Not on file    Emotionally abused: Not on file    Physically abused: Not on file    Forced sexual activity: Not on file  Other Topics Concern  . Not on file  Social History Narrative  . Not on file    Review of Systems: See HPI, otherwise negative ROS  Physical Exam: BP (!) 157/126   Pulse 87   Temp 97.7 F (36.5 C) (Temporal)   Ht 5\' 10"  (1.778 m)   Wt 226 lb (102.5 kg)   SpO2 98%   BMI 32.43 kg/m  General:   Alert,  pleasant and cooperative in NAD Head:  Normocephalic and atraumatic. Neck:  Supple; no masses or thyromegaly. Lungs:  Clear throughout to auscultation.    Heart:  Regular rate and rhythm. Abdomen:  Soft, nontender and nondistended. Normal bowel sounds, without guarding, and without rebound.   Neurologic:  Alert and  oriented x4;  grossly normal neurologically.  Impression/Plan: [name] is now here to undergo a screening colonoscopy.  Risks, benefits, and alternatives regarding colonoscopy have been reviewed with the patient.   Questions have been answered.  All parties agreeable.

## 2017-06-06 NOTE — Progress Notes (Signed)
Colonoscopy cancelled by Dr. Junious Dresser, anesthesiologist, due to elevated blood pressure.

## 2017-06-08 ENCOUNTER — Telehealth: Payer: Self-pay

## 2017-06-08 NOTE — Telephone Encounter (Signed)
Copied from [facility] #[id number]. Topic: General - Other >> Jun 08, 2017  9:40 AM Carolyn Stare wrote:  Cpap machine is broken and need a order for a new one,   [facility]    1 [phone]    Pt said she could not have a colonoscopy cause her bp was to high

## 2017-06-08 NOTE — Telephone Encounter (Signed)
Yes, Thank you

## 2017-06-08 NOTE — Telephone Encounter (Signed)
Will contact [place] about cpap for next steps.   Please advise for patient's BP because of her Colonoscopy.

## 2017-06-08 NOTE — Telephone Encounter (Signed)
I am happy to give a verbal order.  Her BP may be up due to broken CPAP.  If not, needs to be seen

## 2017-06-08 NOTE — Telephone Encounter (Signed)
Spoke with [facility].  Insurance will require a visit from [place] about her difficulties being off CPAP and that it is broken. Also will need copy of sleep study before they can send the request to insurance.  Patient was a new patient of [facility]. We don't know how long patient's had the machine or who did the sleep study. But patient needs an appointment first.  [name], is this okay?

## 2017-06-09 NOTE — Telephone Encounter (Signed)
Could someone schedule an appointment for patient to go over her CPAP and Sleep for insurance reasons. Also needs to know where she got her sleep study.

## 2017-06-09 NOTE — Telephone Encounter (Signed)
Patient scheduled 06/14/2017 at 3:30 with [name].

## 2017-06-14 ENCOUNTER — Ambulatory Visit (INDEPENDENT_AMBULATORY_CARE_PROVIDER_SITE_OTHER): Payer: Medicaid Other | Admitting: Unknown Physician Specialty

## 2017-06-14 ENCOUNTER — Encounter: Payer: Self-pay | Admitting: Unknown Physician Specialty

## 2017-06-14 VITALS — BP 129/93 | HR 90 | Temp 97.8°F | Ht 66.0 in | Wt 228.2 lb

## 2017-06-14 DIAGNOSIS — I10 Essential (primary) hypertension: Secondary | ICD-10-CM | POA: Diagnosis not present

## 2017-06-14 DIAGNOSIS — G4730 Sleep apnea, unspecified: Secondary | ICD-10-CM

## 2017-06-14 DIAGNOSIS — E785 Hyperlipidemia, unspecified: Secondary | ICD-10-CM | POA: Diagnosis not present

## 2017-06-14 MED ORDER — ATORVASTATIN CALCIUM 20 MG PO TABS: 20.0000 mg | ORAL_TABLET | Freq: Every day | ORAL | 1 refills | Status: DC

## 2017-06-14 NOTE — Progress Notes (Signed)
BP (!) 129/93 (BP Location: Left Arm, Cuff Size: Large)   Pulse 90   Temp 97.8 F (36.6 C) (Oral)   Ht 5\' 6"  (1.676 m)   Wt 228 lb 3.2 oz (103.5 kg)   SpO2 97%   BMI 36.83 kg/m    Subjective:    Patient ID: [name], female    DOB: 1965-10-21, 52 y.o.   MRN: [id number]  HPI: [name] is a 52 y.o. female  Chief Complaint  Patient presents with  . CPAP    pt states her CPAP machine is broke and was told to come in to discuss   Sleep apnea Pt uses a CPAP machine but states "it broke" and needs another machine and tubing.  Pt states she was tested, she thinks, 09/2012 at [facility]  I cannot find these records.  If she does not use the CPAP she hears herself snoring, loses breath, and "jumps up."  Can't sleep without is.  She can't find the number on her CPAP.    Hypercholesterol Reviewed labs.  Pt states she is not taking Atorvastain as she didn't know she was supposed to.    Hypertension BP too high at GI and couldn't get her colonoscopy.    Relevant past medical, surgical, family and social history reviewed and updated as indicated. Interim medical history since our last visit reviewed. Allergies and medications reviewed and updated.  Review of Systems  Respiratory: Negative.   Cardiovascular: Negative.   Gastrointestinal: Negative.   Psychiatric/Behavioral: Negative.     Per HPI unless specifically indicated above     Objective:    BP (!) 129/93 (BP Location: Left Arm, Cuff Size: Large)   Pulse 90   Temp 97.8 F (36.6 C) (Oral)   Ht 5\' 6"  (1.676 m)   Wt 228 lb 3.2 oz (103.5 kg)   SpO2 97%   BMI 36.83 kg/m   Wt Readings from Last 3 Encounters:  06/14/17 228 lb 3.2 oz (103.5 kg)  06/06/17 226 lb (102.5 kg)  05/09/17 223 lb 8 oz (101.4 kg)    Physical Exam  Constitutional: She is oriented to person, place, and time. She appears well-developed and well-nourished. No distress.  HENT:  Head: Normocephalic and atraumatic.  Eyes: Conjunctivae  and lids are normal. Right eye exhibits no discharge. Left eye exhibits no discharge. No scleral icterus.  Neck: Normal range of motion. Neck supple. No JVD present. Carotid bruit is not present.  Cardiovascular: Normal rate and normal heart sounds. An irregularly irregular rhythm present.  Pulmonary/Chest: Effort normal and breath sounds normal.  Abdominal: Normal appearance. There is no splenomegaly or hepatomegaly.  Musculoskeletal: Normal range of motion.  Neurological: She is alert and oriented to person, place, and time.  Skin: Skin is warm, dry and intact. No rash noted. No pallor.  Psychiatric: She has a normal mood and affect. Her behavior is normal. Judgment and thought content normal.    Results for orders placed or performed in visit on 05/09/17  Microscopic Examination  Result Value Ref Range   WBC, UA 0-5 0 - 5 /hpf   RBC, UA 0-2 0 - 2 /hpf   Epithelial Cells (non renal) 0-10 0 - 10 /hpf   Bacteria, UA Few None seen/Few  CBC with Differential/Platelet  Result Value Ref Range   WBC 8.4 3.4 - 10.8 x10E3/uL   RBC 4.12 3.77 - 5.28 x10E6/uL   Hemoglobin 11.6 11.1 - 15.9 g/dL   Hematocrit 35.4 34.0 -  46.6 %   MCV 86 79 - 97 fL   MCH 28.2 26.6 - 33.0 pg   MCHC 32.8 31.5 - 35.7 g/dL   RDW 14.1 12.3 - 15.4 %   Platelets 346 150 - 379 x10E3/uL   Neutrophils 68 Not Estab. %   Lymphs 22 Not Estab. %   Monocytes 9 Not Estab. %   Eos 1 Not Estab. %   Basos 0 Not Estab. %   Neutrophils Absolute 5.7 1.4 - 7.0 x10E3/uL   Lymphocytes Absolute 1.9 0.7 - 3.1 x10E3/uL   Monocytes Absolute 0.7 0.1 - 0.9 x10E3/uL   EOS (ABSOLUTE) 0.1 0.0 - 0.4 x10E3/uL   Basophils Absolute 0.0 0.0 - 0.2 x10E3/uL   Immature Granulocytes 0 Not Estab. %   Immature Grans (Abs) 0.0 0.0 - 0.1 x10E3/uL  Comprehensive metabolic panel  Result Value Ref Range   Glucose 85 65 - 99 mg/dL   BUN 13 6 - 24 mg/dL   Creatinine, Ser 0.81 0.57 - 1.00 mg/dL   GFR calc non Af Amer 84 >59 mL/min/1.73   GFR calc Af  Amer 97 >59 mL/min/1.73   BUN/Creatinine Ratio 16 9 - 23   Sodium 147 (H) 134 - 144 mmol/L   Potassium 3.4 (L) 3.5 - 5.2 mmol/L   Chloride 106 96 - 106 mmol/L   CO2 23 20 - 29 mmol/L   Calcium 9.1 8.7 - 10.2 mg/dL   Total Protein 6.3 6.0 - 8.5 g/dL   Albumin 3.6 3.5 - 5.5 g/dL   Globulin, Total 2.7 1.5 - 4.5 g/dL   Albumin/Globulin Ratio 1.3 1.2 - 2.2   Bilirubin Total <0.2 0.0 - 1.2 mg/dL   Alkaline Phosphatase 58 39 - 117 IU/L   AST 12 0 - 40 IU/L   ALT 6 0 - 32 IU/L  Lipid Panel w/o Chol/HDL Ratio  Result Value Ref Range   Cholesterol, Total 202 (H) 100 - 199 mg/dL   Triglycerides 111 0 - 149 mg/dL   HDL 40 >39 mg/dL   VLDL Cholesterol Cal 22 5 - 40 mg/dL   LDL Calculated 140 (H) 0 - 99 mg/dL  TSH  Result Value Ref Range   TSH 0.481 0.450 - 4.500 uIU/mL  UA/M w/rflx Culture, Routine  Result Value Ref Range   Specific Gravity, UA 1.020 1.005 - 1.030   pH, UA 6.5 5.0 - 7.5   Color, UA Orange Yellow   Appearance Ur Cloudy (A) Clear   Leukocytes, UA Trace (A) Negative   Protein, UA 1+ (A) Negative/Trace   Glucose, UA Negative Negative   Ketones, UA Trace (A) Negative   RBC, UA Negative Negative   Bilirubin, UA Negative Negative   Urobilinogen, Ur 0.2 0.2 - 1.0 mg/dL   Nitrite, UA Negative Negative   Microscopic Examination See below:   Uric acid  Result Value Ref Range   Uric Acid 6.5 2.5 - 7.1 mg/dL      Assessment & Plan:   Problem List Items Addressed This Visit      Unprioritized   Essential hypertension (Chronic)    High at GI but excellent here.  She thinks maybe she skipped some medicine that day      Relevant Medications   atorvastatin (LIPITOR) 20 MG tablet   Hyperlipidemia    Refill Atorvastatin as pt not aware she should be taking it.        Relevant Medications   atorvastatin (LIPITOR) 20 MG tablet   Sleep apnea - Primary  Unable to find notes from [facility] showing sleep study.  Will Refer to [facility] to get her a new machine.  If that  does not work, put in a referral for a new sleep study      Relevant Orders   Ambulatory referral to Sleep Studies       Follow up plan: Return for October visit with Merrie Roof PA.

## 2017-06-14 NOTE — Assessment & Plan Note (Signed)
Refill Atorvastatin as pt not aware she should be taking it.

## 2017-06-14 NOTE — Assessment & Plan Note (Signed)
Unable to find notes from [facility] showing sleep study.  Will Refer to [facility] to get her a new machine.  If that does not work, put in a referral for a new sleep study

## 2017-06-14 NOTE — Assessment & Plan Note (Signed)
High at GI but excellent here.  She thinks maybe she skipped some medicine that day

## 2017-06-17 ENCOUNTER — Ambulatory Visit: Payer: Medicaid Other | Admitting: Unknown Physician Specialty

## 2017-10-13 ENCOUNTER — Other Ambulatory Visit: Payer: Self-pay | Admitting: Family Medicine

## 2017-11-08 ENCOUNTER — Encounter: Payer: Self-pay | Admitting: Family Medicine

## 2017-11-08 ENCOUNTER — Ambulatory Visit (INDEPENDENT_AMBULATORY_CARE_PROVIDER_SITE_OTHER): Payer: Medicaid Other | Admitting: Family Medicine

## 2017-11-08 VITALS — BP 120/94 | HR 74 | Temp 98.0°F | Ht 66.5 in | Wt 235.6 lb

## 2017-11-08 DIAGNOSIS — I5032 Chronic diastolic (congestive) heart failure: Secondary | ICD-10-CM | POA: Diagnosis not present

## 2017-11-08 DIAGNOSIS — I10 Essential (primary) hypertension: Secondary | ICD-10-CM | POA: Diagnosis not present

## 2017-11-08 DIAGNOSIS — G4730 Sleep apnea, unspecified: Secondary | ICD-10-CM

## 2017-11-08 DIAGNOSIS — E876 Hypokalemia: Secondary | ICD-10-CM

## 2017-11-08 DIAGNOSIS — J449 Chronic obstructive pulmonary disease, unspecified: Secondary | ICD-10-CM

## 2017-11-08 DIAGNOSIS — E785 Hyperlipidemia, unspecified: Secondary | ICD-10-CM

## 2017-11-08 DIAGNOSIS — M1A9XX0 Chronic gout, unspecified, without tophus (tophi): Secondary | ICD-10-CM

## 2017-11-08 MED ORDER — METOPROLOL SUCCINATE ER 100 MG PO TB24: 100.0000 mg | ORAL_TABLET | Freq: Every day | ORAL | 1 refills | Status: DC

## 2017-11-08 MED ORDER — ALLOPURINOL 100 MG PO TABS: 100.0000 mg | ORAL_TABLET | Freq: Every day | ORAL | 6 refills | Status: DC

## 2017-11-08 MED ORDER — FUROSEMIDE 40 MG PO TABS: 40.0000 mg | ORAL_TABLET | Freq: Every day | ORAL | 1 refills | Status: DC

## 2017-11-08 MED ORDER — ATORVASTATIN CALCIUM 20 MG PO TABS: 20.0000 mg | ORAL_TABLET | Freq: Every day | ORAL | 1 refills | Status: DC

## 2017-11-08 MED ORDER — PANTOPRAZOLE SODIUM 40 MG PO TBEC: 40.0000 mg | DELAYED_RELEASE_TABLET | Freq: Every day | ORAL | 3 refills | Status: DC

## 2017-11-08 MED ORDER — CLONIDINE HCL 0.1 MG PO TABS: 0.1000 mg | ORAL_TABLET | Freq: Two times a day (BID) | ORAL | 6 refills | Status: DC

## 2017-11-08 NOTE — Patient Instructions (Signed)
Call the [facility] for a follow up appointment

## 2017-11-08 NOTE — Progress Notes (Signed)
BP (!) 120/94   Pulse 74   Temp 98 F (36.7 C) (Oral)   Ht 5' 6.5" (1.689 m)   Wt 235 lb 9.6 oz (106.9 kg)   SpO2 96%   BMI 37.46 kg/m    Subjective:    Patient ID: [name], female    DOB: [birth year], 52 y.o.   MRN: [id number]  HPI: [name] is a 51 y.o. female  Chief Complaint  Patient presents with  . Follow-up    6 month F/U for HTN, Sleep Apnea, and Hyperlipidemia.   . Hypertension    High BP reading. Patient states she just took her medication  . Ear Problem    Patient states she had an appointment to get her ears cleaned, but missed it.   . Hyperlipidemia  . Sleep Apnea   Here today for 6 month f/u. Not checking home BPs. Taking medications daily "as best as I can". Denies side effects, CP, SOB.   Has been out of lasix for about a month. Has also been out of metoprolol and clonidine as well. Did not follow up with HF clinic as instructed, states she didn't realize she was supposed to go back. No edema, orthopnea, DOE.   Got new CPAP and sleeping much better, uses nightly.   Taking lipitor for HLD, just started this back in May 2019 as she was unaware she was supposed to be taking it prior to that time.   Moods stable with zoloft, no concerns. Denies SI/HI, sleep or appetite issues.   Depression screen [facility] 2/9 11/08/2017 05/09/2017 04/07/2017  Decreased Interest 1 1 1   Down, Depressed, Hopeless 2 1 1   PHQ - 2 Score 3 2 2   Altered sleeping 3 2 2   Tired, decreased energy 2 2 1   Change in appetite 1 3 0  Feeling bad or failure about yourself  1 1 0  Trouble concentrating 1 1 0  Moving slowly or fidgety/restless 1 1 1   Suicidal thoughts 0 1 0  PHQ-9 Score 12 13 6   Difficult doing work/chores - - -    Relevant past medical, surgical, family and social history reviewed and updated as indicated. Interim medical history since our last visit reviewed. Allergies and medications reviewed and updated.  Review of Systems  Per HPI unless specifically  indicated above     Objective:    BP (!) 120/94   Pulse 74   Temp 98 F (36.7 C) (Oral)   Ht 5' 6.5" (1.689 m)   Wt 235 lb 9.6 oz (106.9 kg)   SpO2 96%   BMI 37.46 kg/m   Wt Readings from Last 3 Encounters:  11/08/17 235 lb 9.6 oz (106.9 kg)  06/14/17 228 lb 3.2 oz (103.5 kg)  06/06/17 226 lb (102.5 kg)    Physical Exam  Constitutional: She is oriented to person, place, and time. She appears well-developed and well-nourished. No distress.  HENT:  Head: Atraumatic.  Eyes: Conjunctivae and EOM are normal.  Neck: Normal range of motion. Neck supple.  Cardiovascular: Normal rate and normal heart sounds.  Pulmonary/Chest: Effort normal and breath sounds normal.  Musculoskeletal: Normal range of motion. She exhibits no edema.  Neurological: She is alert and oriented to person, place, and time. No cranial nerve deficit.  Skin: Skin is warm and dry.  Psychiatric: She has a normal mood and affect. Her behavior is normal.  Nursing note and vitals reviewed.   Results for orders placed or performed in visit on  11/08/17  Lipid Panel w/o Chol/HDL Ratio  Result Value Ref Range   Cholesterol, Total 208 (H) 100 - 199 mg/dL   Triglycerides 122 0 - 149 mg/dL   HDL 74 >39 mg/dL   VLDL Cholesterol Cal 24 5 - 40 mg/dL   LDL Calculated 110 (H) 0 - 99 mg/dL  Comprehensive metabolic panel  Result Value Ref Range   Glucose 87 65 - 99 mg/dL   BUN 17 6 - 24 mg/dL   Creatinine, Ser 0.62 0.57 - 1.00 mg/dL   GFR calc non Af Amer 105 >59 mL/min/1.73   GFR calc Af Amer 121 >59 mL/min/1.73   BUN/Creatinine Ratio 27 (H) 9 - 23   Sodium 150 (H) 134 - 144 mmol/L   Potassium 3.4 (L) 3.5 - 5.2 mmol/L   Chloride 105 96 - 106 mmol/L   CO2 25 20 - 29 mmol/L   Calcium 9.5 8.7 - 10.2 mg/dL   Total Protein 6.4 6.0 - 8.5 g/dL   Albumin 4.0 3.5 - 5.5 g/dL   Globulin, Total 2.4 1.5 - 4.5 g/dL   Albumin/Globulin Ratio 1.7 1.2 - 2.2   Bilirubin Total 0.6 0.0 - 1.2 mg/dL   Alkaline Phosphatase 68 39 - 117  IU/L   AST 12 0 - 40 IU/L   ALT 10 0 - 32 IU/L      Assessment & Plan:   Problem List Items Addressed This Visit      Cardiovascular and Mediastinum   Essential hypertension - Primary (Chronic)    Out of several medications, BP slightly above goal. Start checking at home regularly, work on medication compliance. WIll restart full regimen      Relevant Medications   furosemide (LASIX) 40 MG tablet   cloNIDine (CATAPRES) 0.1 MG tablet   metoprolol succinate (TOPROL-XL) 100 MG 24 hr tablet   atorvastatin (LIPITOR) 20 MG tablet   Chronic diastolic heart failure (HCC) (Chronic)    Schedule f/u with HF clinic ASAP, stressed importance of keeping these appts for monitoring. Restart lasix. Monitor electrolytes      Relevant Medications   furosemide (LASIX) 40 MG tablet   cloNIDine (CATAPRES) 0.1 MG tablet   metoprolol succinate (TOPROL-XL) 100 MG 24 hr tablet   atorvastatin (LIPITOR) 20 MG tablet     Respiratory   COPD (chronic obstructive pulmonary disease) (HCC)    Stable with no recent flares, not currently on any inhalers      Sleep apnea    Stable on CPAP, continue nightly use        Other   Hypokalemia    Recheck BMP today. Will adjust potassium depending on results. Also has been out of lasix for a month now, will recheck after about 2 weeks back on.       Hyperlipidemia    Check lipids and adjust as needed. Lifestyle modifications reviewed      Relevant Medications   furosemide (LASIX) 40 MG tablet   cloNIDine (CATAPRES) 0.1 MG tablet   metoprolol succinate (TOPROL-XL) 100 MG 24 hr tablet   atorvastatin (LIPITOR) 20 MG tablet   Other Relevant Orders   Lipid Panel w/o Chol/HDL Ratio (Completed)   Comprehensive metabolic panel (Completed)   Gout    No recent flares, check BMP today. Continue allopurinol          Follow up plan: Return in about 3 months (around 02/08/2018) for BP f/u.

## 2017-11-09 LAB — COMPREHENSIVE METABOLIC PANEL
A/G RATIO: 1.7 (ref 1.2–2.2)
ALT: 10 IU/L (ref 0–32)
AST: 12 IU/L (ref 0–40)
Albumin: 4 g/dL (ref 3.5–5.5)
Alkaline Phosphatase: 68 IU/L (ref 39–117)
BUN/Creatinine Ratio: 27 — ABNORMAL HIGH (ref 9–23)
BUN: 17 mg/dL (ref 6–24)
Bilirubin Total: 0.6 mg/dL (ref 0.0–1.2)
CALCIUM: 9.5 mg/dL (ref 8.7–10.2)
CO2: 25 mmol/L (ref 20–29)
CREATININE: 0.62 mg/dL (ref 0.57–1.00)
Chloride: 105 mmol/L (ref 96–106)
GFR, EST AFRICAN AMERICAN: 121 mL/min/{1.73_m2} (ref >59)
GFR, EST NON AFRICAN AMERICAN: 105 mL/min/{1.73_m2} (ref >59)
Globulin, Total: 2.4 g/dL (ref 1.5–4.5)
Glucose: 87 mg/dL (ref 65–99)
POTASSIUM: 3.4 mmol/L — AB (ref 3.5–5.2)
Sodium: 150 mmol/L — ABNORMAL HIGH (ref 134–144)
Total Protein: 6.4 g/dL (ref 6.0–8.5)

## 2017-11-09 LAB — LIPID PANEL W/O CHOL/HDL RATIO
CHOLESTEROL TOTAL: 208 mg/dL — AB (ref 100–199)
HDL: 74 mg/dL (ref >39)
LDL CALC: 110 mg/dL — AB (ref 0–99)
Triglycerides: 122 mg/dL (ref 0–149)
VLDL Cholesterol Cal: 24 mg/dL (ref 5–40)

## 2017-11-13 NOTE — Assessment & Plan Note (Signed)
Out of several medications, BP slightly above goal. Start checking at home regularly, work on medication compliance. WIll restart full regimen

## 2017-11-13 NOTE — Assessment & Plan Note (Signed)
No recent flares, check BMP today. Continue allopurinol

## 2017-11-13 NOTE — Assessment & Plan Note (Signed)
Stable with no recent flares, not currently on any inhalers

## 2017-11-13 NOTE — Assessment & Plan Note (Signed)
Stable on CPAP, continue nightly use

## 2017-11-13 NOTE — Assessment & Plan Note (Signed)
Check lipids and adjust as needed. Lifestyle modifications reviewed

## 2017-11-13 NOTE — Assessment & Plan Note (Signed)
Schedule f/u with HF clinic ASAP, stressed importance of keeping these appts for monitoring. Restart lasix. Monitor electrolytes

## 2017-11-13 NOTE — Assessment & Plan Note (Signed)
Recheck BMP today. Will adjust potassium depending on results. Also has been out of lasix for a month now, will recheck after about 2 weeks back on.

## 2017-11-14 ENCOUNTER — Other Ambulatory Visit: Payer: Self-pay | Admitting: Family Medicine

## 2017-11-14 DIAGNOSIS — E876 Hypokalemia: Secondary | ICD-10-CM

## 2017-11-14 MED ORDER — ATORVASTATIN CALCIUM 40 MG PO TABS: 40.0000 mg | ORAL_TABLET | Freq: Every day | ORAL | 1 refills | Status: DC

## 2017-11-24 ENCOUNTER — Other Ambulatory Visit: Payer: Self-pay | Admitting: Family Medicine

## 2017-12-01 ENCOUNTER — Ambulatory Visit: Payer: Medicaid Other | Attending: Family | Admitting: Family

## 2017-12-01 ENCOUNTER — Encounter: Payer: Self-pay | Admitting: Family

## 2017-12-01 VITALS — BP 139/104 | HR 77 | Resp 18 | Ht 66.0 in | Wt 236.5 lb

## 2017-12-01 DIAGNOSIS — I10 Essential (primary) hypertension: Secondary | ICD-10-CM

## 2017-12-01 DIAGNOSIS — Z79899 Other long term (current) drug therapy: Secondary | ICD-10-CM | POA: Diagnosis not present

## 2017-12-01 DIAGNOSIS — R5383 Other fatigue: Secondary | ICD-10-CM | POA: Diagnosis present

## 2017-12-01 DIAGNOSIS — I5032 Chronic diastolic (congestive) heart failure: Secondary | ICD-10-CM | POA: Insufficient documentation

## 2017-12-01 DIAGNOSIS — Z8673 Personal history of transient ischemic attack (TIA), and cerebral infarction without residual deficits: Secondary | ICD-10-CM | POA: Insufficient documentation

## 2017-12-01 DIAGNOSIS — M109 Gout, unspecified: Secondary | ICD-10-CM | POA: Insufficient documentation

## 2017-12-01 DIAGNOSIS — Z885 Allergy status to narcotic agent status: Secondary | ICD-10-CM | POA: Diagnosis not present

## 2017-12-01 DIAGNOSIS — J449 Chronic obstructive pulmonary disease, unspecified: Secondary | ICD-10-CM | POA: Diagnosis not present

## 2017-12-01 DIAGNOSIS — K760 Fatty (change of) liver, not elsewhere classified: Secondary | ICD-10-CM | POA: Diagnosis not present

## 2017-12-01 DIAGNOSIS — Z7982 Long term (current) use of aspirin: Secondary | ICD-10-CM | POA: Insufficient documentation

## 2017-12-01 DIAGNOSIS — I110 Hypertensive heart disease with heart failure: Secondary | ICD-10-CM | POA: Diagnosis not present

## 2017-12-01 DIAGNOSIS — Z87891 Personal history of nicotine dependence: Secondary | ICD-10-CM | POA: Insufficient documentation

## 2017-12-01 DIAGNOSIS — Z880 Allergy status to penicillin: Secondary | ICD-10-CM | POA: Insufficient documentation

## 2017-12-01 DIAGNOSIS — I4891 Unspecified atrial fibrillation: Secondary | ICD-10-CM | POA: Insufficient documentation

## 2017-12-01 DIAGNOSIS — Z86711 Personal history of pulmonary embolism: Secondary | ICD-10-CM | POA: Diagnosis not present

## 2017-12-01 DIAGNOSIS — K219 Gastro-esophageal reflux disease without esophagitis: Secondary | ICD-10-CM | POA: Diagnosis not present

## 2017-12-01 DIAGNOSIS — G4733 Obstructive sleep apnea (adult) (pediatric): Secondary | ICD-10-CM | POA: Insufficient documentation

## 2017-12-01 MED ORDER — METOPROLOL SUCCINATE ER 200 MG PO TB24: 200.0000 mg | ORAL_TABLET | Freq: Every day | ORAL | 3 refills | Status: DC

## 2017-12-01 NOTE — Progress Notes (Signed)
HPI   [name] is a 52 y/o female with a history of pulmonary embolus, obstructive sleep apnea, atrial fibrillation, COPD, gout, HTN, CVA, remote tobacco use and chronic heart failure.   Echo report from 06/17/16 reviewed and showed an EF of 55-65%. Had a pharmacologic stress trest done 09/12/12 and had an estimated EF of 49%.   Was in the ED 11/16/17 due to headache where she was treated and released.   She presents today for a follow-up visit with a chief complaint of moderate fatigue upon minimal exertion. She describes this as chronic in nature having been present for several years. She has recently moved and thinks this may have worsened her fatigue some. She has associated shortness of breath and slight weight gain along with this. She denies any difficulty sleeping, abdominal distention, palpitations, pedal edema, chest pain, cough or dizziness.   Past Medical History:  Diagnosis Date  . A-fib ([place]) 2010  . Adrenal adenoma, left   . Alcohol abuse   . Allergy   . CHF (congestive heart failure) ([place])   . COPD (chronic obstructive pulmonary disease) ([place])   . Fatty liver   . GERD (gastroesophageal reflux disease)   . Gout   . Hypertension   . Mild cognitive impairment   . Non-healing non-surgical wound    right leg  . Obesity   . Pulmonary embolus ([place])   . Sleep apnea    uses Cpap  . Stroke ([place])   . Thyroid disease    Past Surgical History:  Procedure Laterality Date  . bilateral wrist fractures  2010  . lt ankle fracture  2002   Family History  Problem Relation Age of Onset  . Prostate cancer Father   . Rectal cancer Sister   . Breast cancer Sister   . Cancer Maternal Grandmother   . Cancer Maternal Grandfather    Social History   Tobacco Use  . Smoking status: Former Smoker    Types: Cigarettes    Last attempt to quit: 01/26/2016    Years since quitting: 1.8  . Smokeless tobacco: Never Used  Substance Use Topics  . Alcohol use: Not Currently    Comment: occ    Allergies  Allergen Reactions  . Morphine And Related Hives  . Penicillins Other (See Comments)    Painful urination Has patient had a PCN reaction causing immediate rash, facial/tongue/throat swelling, SOB or lightheadedness with hypotension: yes Has patient had a PCN reaction causing severe rash involving mucus membranes or skin necrosis: no Has patient had a PCN reaction that required hospitalization no Has patient had a PCN reaction occurring within the last 10 years: no If all of the above answers are "NO", then may proceed with Cephalosporin use.    Prior to Admission medications   Medication Sig Start Date End Date Taking? Authorizing Provider  allopurinol (ZYLOPRIM) 100 MG tablet Take 1 tablet (100 mg total) by mouth daily. 11/08/17  Yes Volney American, PA-C  amLODipine (NORVASC) 10 MG tablet Take 1 tablet (10 mg total) by mouth daily. 04/07/17  Yes Volney American, PA-C  aspirin EC 81 MG tablet Take 81 mg by mouth daily.   Yes [provider]  atorvastatin (LIPITOR) 40 MG tablet Take 1 tablet (40 mg total) by mouth daily. 11/14/17  Yes Volney American, PA-C  butalbital-acetaminophen-caffeine [place], [place]) [phone] MG tablet Take 1-2 tablets by mouth every 6 (six) hours as needed for headache. 04/12/17 04/12/18 Yes Loney Hering, MD  cloNIDine (CATAPRES) 0.1 MG tablet Take 1 tablet (0.1 mg total) by mouth 2 (two) times daily. 11/08/17  Yes Volney American, PA-C  colchicine 0.6 MG tablet Take 1 tablet (0.6 mg total) by mouth 2 (two) times daily. As needed for gout flares 04/19/17  Yes Volney American, PA-C  diphenhydrAMINE (BENADRYL) 25 MG tablet Take 25 mg by mouth every 6 (six) hours as needed.   Yes [provider]  furosemide (LASIX) 40 MG tablet Take 1 tablet (40 mg total) by mouth daily. 11/08/17 02/06/18 Yes Volney American, PA-C  hydrALAZINE (APRESOLINE) 50 MG tablet TAKE 1 TABLET BY MOUTH THREE TIMES A DAY  11/24/17  Yes Volney American, PA-C  lisinopril (PRINIVIL,ZESTRIL) 20 MG tablet Take 1 tablet (20 mg total) by mouth daily. 03/24/17  Yes Darylene Price A, FNP  metoprolol succinate (TOPROL-XL) 100 MG 24 hr tablet Take 1 tablet (100 mg total) by mouth daily. Take with or immediately following a meal. 11/08/17  Yes Volney American, PA-C  ondansetron (ZOFRAN ODT) 4 MG disintegrating tablet Take 1 tablet (4 mg total) by mouth every 8 (eight) hours as needed for nausea or vomiting. 04/12/17  Yes Loney Hering, MD  pantoprazole (PROTONIX) 40 MG tablet Take 1 tablet (40 mg total) by mouth daily. 11/08/17  Yes Volney American, PA-C  potassium chloride SA (KLOR-CON M20) 20 MEQ tablet Take 1 tablet (20 mEq total) by mouth daily. Patient taking differently: Take 20 mEq by mouth 2 (two) times daily.  03/24/17  Yes Darylene Price A, FNP  sertraline (ZOLOFT) 100 MG tablet TAKE 1 TABLET BY MOUTH EVERY DAY 10/13/17  Yes Volney American, PA-C  spironolactone (ALDACTONE) 25 MG tablet Take 2 tablets (50 mg total) by mouth daily. 04/07/17  Yes Volney American, PA-C    Review of Systems  Constitutional: Positive for fatigue (with minimal exertion). Negative for appetite change.  HENT: Negative for congestion and postnasal drip.   Eyes: Negative.   Respiratory: Positive for shortness of breath. Negative for cough and chest tightness.   Cardiovascular: Negative for chest pain, palpitations and leg swelling.  Gastrointestinal: Negative for abdominal distention, abdominal pain and nausea.  Endocrine: Negative.   Genitourinary: Negative.   Musculoskeletal: Negative for back pain.  Skin: Negative.   Allergic/Immunologic: Negative.   Neurological: Negative for dizziness and light-headedness.  Hematological: Negative for adenopathy. Does not bruise/bleed easily.  Psychiatric/Behavioral: Negative for dysphoric mood and sleep disturbance (wearing CPAP nightly; sleeping on 2 pillows). The  patient is not nervous/anxious.    Vitals:   12/01/17 1053  BP: (!) 139/104  Pulse: 77  Resp: 18  SpO2: 98%  Weight: 236 lb 8 oz (107.3 kg)  Height: 5\' 6"  (1.676 m)   Wt Readings from Last 3 Encounters:  12/01/17 236 lb 8 oz (107.3 kg)  11/08/17 235 lb 9.6 oz (106.9 kg)  06/14/17 228 lb 3.2 oz (103.5 kg)   Lab Results  Component Value Date   CREATININE 0.62 11/08/2017   CREATININE 0.81 05/09/2017   CREATININE 0.72 04/12/2017    Physical Exam  Constitutional: She is oriented to person, place, and time. She appears well-developed and well-nourished.  HENT:  Head: Normocephalic and atraumatic.  Neck: Normal range of motion. Neck supple. No JVD present.  Cardiovascular: Normal rate and regular rhythm.  Pulmonary/Chest: Effort normal. She has no wheezes. She has no rales.  Abdominal: Soft. She exhibits no distension. There is no tenderness.  Musculoskeletal: She exhibits no edema or  tenderness.  Neurological: She is alert and oriented to person, place, and time.  Skin: Skin is warm and dry.  Psychiatric: She has a normal mood and affect. Her behavior is normal. Thought content normal.  Nursing note and vitals reviewed.   Assessment & Plan:  1: Chronic heart failure with preserved ejection fraction- - NYHA class III - euvolemic today - weighing daily. She is to call for an overnight weight gain of >2 pounds or a weekly weight gain of >5 pounds.  - weight up 4 pounds since her last visit here 8 months ago - not adding salt to her food. Reminded to closely follow a 2000mg  sodium diet  - saw cardiology Margarito Courser) 06/18/16 - does not get the flu vaccine; encouraged good handwashing - PharmD reconciled medications with the patient  2: HTN- - BP elevated today - will increase metoprolol succinate to 200mg  daily; she can finish out her current bottle by taking 2 daily until gone and then begin the 200mg  dose as 1 tablet daily - saw PCP Orene Desanctis) 11/08/17 - reviewed BMP done 11/08/17  and it showed sodium 150, potassium 3.4, creatinine 0.62 and GFR 121  Patient did not bring her medications nor a list. Each medication was verbally reviewed with the patient and she was encouraged to bring the bottles to every visit to confirm accuracy of list.  Return in 1 month or sooner for any questions/problems before then.

## 2017-12-01 NOTE — Patient Instructions (Addendum)
Continue weighing daily and call for an overnight weight gain of > 2 pounds or a weekly weight gain of >5 pounds.  Finish current metoprolol bottle by taking 2 tablets daily until gone. Then begin the new prescription of 200mg  tablet once daily.

## 2017-12-22 NOTE — Progress Notes (Signed)
HPI   [name] is a 52 y/o female with a history of pulmonary embolus, obstructive sleep apnea, atrial fibrillation, COPD, gout, HTN, CVA, remote tobacco use and chronic heart failure.   Echo report from 06/17/16 reviewed and showed an EF of 55-65%. Had a pharmacologic stress trest done 09/12/12 and had an estimated EF of 49%.   Was in the ED 11/16/17 due to headache where she was treated and released.   She presents today for a follow-up visit with a chief complaint of moderate shortness of breath upon minimal exertion. She describes this as chronic in nature having been present for several years but has worsened over the last few days. She has associated fatigue and swelling in her abdomen. She denies any difficulty sleeping, palpitations, pedal edema, chest pain, cough or dizziness. Hasn't been weighing herself consistently.   Past Medical History:  Diagnosis Date  . A-fib ([place]) 2010  . Adrenal adenoma, left   . Alcohol abuse   . Allergy   . CHF (congestive heart failure) ([place])   . COPD (chronic obstructive pulmonary disease) ([place])   . Fatty liver   . GERD (gastroesophageal reflux disease)   . Gout   . Hypertension   . Mild cognitive impairment   . Non-healing non-surgical wound    right leg  . Obesity   . Pulmonary embolus ([place])   . Sleep apnea    uses Cpap  . Stroke ([place])   . Thyroid disease    Past Surgical History:  Procedure Laterality Date  . bilateral wrist fractures  2010  . lt ankle fracture  2002   Family History  Problem Relation Age of Onset  . Prostate cancer Father   . Rectal cancer Sister   . Breast cancer Sister   . Cancer Maternal Grandmother   . Cancer Maternal Grandfather    Social History   Tobacco Use  . Smoking status: Former Smoker    Types: Cigarettes    Last attempt to quit: 01/26/2016    Years since quitting: 1.9  . Smokeless tobacco: Never Used  Substance Use Topics  . Alcohol use: Not Currently    Comment: occ   Allergies  Allergen  Reactions  . Morphine And Related Hives  . Penicillins Other (See Comments)    Painful urination Has patient had a PCN reaction causing immediate rash, facial/tongue/throat swelling, SOB or lightheadedness with hypotension: yes Has patient had a PCN reaction causing severe rash involving mucus membranes or skin necrosis: no Has patient had a PCN reaction that required hospitalization no Has patient had a PCN reaction occurring within the last 10 years: no If all of the above answers are "NO", then may proceed with Cephalosporin use.    Prior to Admission medications   Medication Sig Start Date End Date Taking? Authorizing Provider  allopurinol (ZYLOPRIM) 100 MG tablet Take 1 tablet (100 mg total) by mouth daily. 11/08/17  Yes Volney American, PA-C  amLODipine (NORVASC) 10 MG tablet Take 1 tablet (10 mg total) by mouth daily. 04/07/17  Yes Volney American, PA-C  aspirin EC 81 MG tablet Take 81 mg by mouth daily.   Yes [provider]  atorvastatin (LIPITOR) 40 MG tablet Take 1 tablet (40 mg total) by mouth daily. 11/14/17  Yes Volney American, PA-C  butalbital-acetaminophen-caffeine [place], [place]) [phone] MG tablet Take 1-2 tablets by mouth every 6 (six) hours as needed for headache. 04/12/17 04/12/18 Yes Loney Hering, MD  cloNIDine (CATAPRES) 0.1 MG  tablet Take 1 tablet (0.1 mg total) by mouth 2 (two) times daily. 11/08/17  Yes Volney American, PA-C  colchicine 0.6 MG tablet Take 1 tablet (0.6 mg total) by mouth 2 (two) times daily. As needed for gout flares 04/19/17  Yes Volney American, PA-C  diphenhydrAMINE (BENADRYL) 25 MG tablet Take 25 mg by mouth every 6 (six) hours as needed.   Yes [provider]  furosemide (LASIX) 40 MG tablet Take 1 tablet (40 mg total) by mouth daily. 12/26/17 03/26/18 Yes Darylene Price A, FNP  hydrALAZINE (APRESOLINE) 50 MG tablet TAKE 1 TABLET BY MOUTH THREE TIMES A DAY 11/24/17  Yes Volney American,  PA-C  lisinopril (PRINIVIL,ZESTRIL) 20 MG tablet Take 1 tablet (20 mg total) by mouth daily. 03/24/17  Yes Darylene Price A, FNP  metoprolol (TOPROL XL) 200 MG 24 hr tablet Take 1 tablet (200 mg total) by mouth daily. 12/01/17  Yes Hackney, Otila Kluver A, FNP  ondansetron (ZOFRAN ODT) 4 MG disintegrating tablet Take 1 tablet (4 mg total) by mouth every 8 (eight) hours as needed for nausea or vomiting. 04/12/17  Yes Loney Hering, MD  pantoprazole (PROTONIX) 40 MG tablet Take 1 tablet (40 mg total) by mouth daily. 11/08/17  Yes Volney American, PA-C  potassium chloride SA (KLOR-CON M20) 20 MEQ tablet Take 1 tablet (20 mEq total) by mouth daily. Patient taking differently: Take 20 mEq by mouth 2 (two) times daily.  03/24/17  Yes Darylene Price A, FNP  sertraline (ZOLOFT) 100 MG tablet TAKE 1 TABLET BY MOUTH EVERY DAY 10/13/17  Yes Volney American, PA-C  spironolactone (ALDACTONE) 25 MG tablet Take 2 tablets (50 mg total) by mouth daily. 04/07/17  Yes Volney American, PA-C    Review of Systems  Constitutional: Positive for fatigue (with minimal exertion). Negative for appetite change.  HENT: Negative for congestion and postnasal drip.   Eyes: Negative.   Respiratory: Positive for shortness of breath. Negative for cough and chest tightness.   Cardiovascular: Negative for chest pain, palpitations and leg swelling.  Gastrointestinal: Positive for abdominal distention. Negative for abdominal pain and nausea.  Endocrine: Negative.   Genitourinary: Negative.   Musculoskeletal: Negative for back pain.  Skin: Negative.   Allergic/Immunologic: Negative.   Neurological: Negative for dizziness and light-headedness.  Hematological: Negative for adenopathy. Does not bruise/bleed easily.  Psychiatric/Behavioral: Negative for dysphoric mood and sleep disturbance (wearing CPAP nightly; sleeping on 2 pillows). The patient is not nervous/anxious.    Vitals:   12/26/17 1122  BP: (!) 158/98  Pulse:  69  SpO2: 98%  Weight: 240 lb 6 oz (109 kg)  Height: 5\' 6"  (1.676 m)   Wt Readings from Last 3 Encounters:  12/26/17 240 lb 6 oz (109 kg)  12/01/17 236 lb 8 oz (107.3 kg)  11/08/17 235 lb 9.6 oz (106.9 kg)   Lab Results  Component Value Date   CREATININE 0.62 11/08/2017   CREATININE 0.81 05/09/2017   CREATININE 0.72 04/12/2017    Physical Exam  Constitutional: She is oriented to person, place, and time. She appears well-developed and well-nourished.  HENT:  Head: Normocephalic and atraumatic.  Neck: Normal range of motion. Neck supple. No JVD present.  Cardiovascular: Normal rate and regular rhythm.  Pulmonary/Chest: Effort normal. She has no wheezes. She has no rales.  Abdominal: Soft. She exhibits no distension. There is no tenderness.  Musculoskeletal: She exhibits no edema or tenderness.  Neurological: She is alert and oriented to person, place, and  time.  Skin: Skin is warm and dry.  Psychiatric: She has a normal mood and affect. Her behavior is normal. Thought content normal.  Nursing note and vitals reviewed.   Assessment & Plan:  1: Chronic heart failure with preserved ejection fraction- - NYHA class III - euvolemic today - not weighing daily. Reminded to weigh daily so that she can call for an overnight weight gain of >2 pounds or a weekly weight gain of >5 pounds.  - weight up 4 pounds from last visit 1 month ago; patient describes feeling "tight" in her abdomen with worsening shortness of breath - advised her to take an additional 40mg  furosemide and 60meq potassium today only - not adding salt to her food. Reminded to closely follow a 2000mg  sodium diet  - saw cardiology Margarito Courser) 06/18/16 - does not get the flu vaccine; encouraged good handwashing  2: HTN- - BP mildly elevated today but she hasn't taken her medications yet today; will do so upon return home - encouraged her to get a BP cuff to monitor her BP at home; she says that she sometimes skips her  medications for a day or two because she feels better when she doesn't take them - explained that her body can get used to high BP's and then if it drops too much at one time, it can make her feel bad; may need to adjust timing of medications if she reports large drops in BP - metoprolol succinate increased to 200mg  daily at her last visit - saw PCP Orene Desanctis) 11/08/17 and returns January 2020 - reviewed BMP done 11/08/17 and it showed sodium 150, potassium 3.4, creatinine 0.62 and GFR 121  Patient did not bring her medications nor a list. Each medication was verbally reviewed with the patient and she was encouraged to bring the bottles to every visit to confirm accuracy of list.  Return in 3 months or sooner for any questions/problems before then.

## 2017-12-26 ENCOUNTER — Encounter: Payer: Self-pay | Admitting: Family

## 2017-12-26 ENCOUNTER — Ambulatory Visit: Payer: Medicaid Other | Attending: Family | Admitting: Family

## 2017-12-26 VITALS — BP 158/98 | HR 69 | Ht 66.0 in | Wt 240.4 lb

## 2017-12-26 DIAGNOSIS — Z880 Allergy status to penicillin: Secondary | ICD-10-CM | POA: Insufficient documentation

## 2017-12-26 DIAGNOSIS — Z7982 Long term (current) use of aspirin: Secondary | ICD-10-CM | POA: Diagnosis not present

## 2017-12-26 DIAGNOSIS — I4891 Unspecified atrial fibrillation: Secondary | ICD-10-CM | POA: Insufficient documentation

## 2017-12-26 DIAGNOSIS — Z87891 Personal history of nicotine dependence: Secondary | ICD-10-CM | POA: Insufficient documentation

## 2017-12-26 DIAGNOSIS — I10 Essential (primary) hypertension: Secondary | ICD-10-CM

## 2017-12-26 DIAGNOSIS — Z79899 Other long term (current) drug therapy: Secondary | ICD-10-CM | POA: Diagnosis not present

## 2017-12-26 DIAGNOSIS — G4733 Obstructive sleep apnea (adult) (pediatric): Secondary | ICD-10-CM | POA: Insufficient documentation

## 2017-12-26 DIAGNOSIS — I5032 Chronic diastolic (congestive) heart failure: Secondary | ICD-10-CM | POA: Diagnosis not present

## 2017-12-26 DIAGNOSIS — M109 Gout, unspecified: Secondary | ICD-10-CM | POA: Diagnosis not present

## 2017-12-26 DIAGNOSIS — J449 Chronic obstructive pulmonary disease, unspecified: Secondary | ICD-10-CM | POA: Insufficient documentation

## 2017-12-26 DIAGNOSIS — I110 Hypertensive heart disease with heart failure: Secondary | ICD-10-CM | POA: Diagnosis not present

## 2017-12-26 DIAGNOSIS — Z885 Allergy status to narcotic agent status: Secondary | ICD-10-CM | POA: Diagnosis not present

## 2017-12-26 DIAGNOSIS — Z86711 Personal history of pulmonary embolism: Secondary | ICD-10-CM | POA: Diagnosis not present

## 2017-12-26 DIAGNOSIS — Z8673 Personal history of transient ischemic attack (TIA), and cerebral infarction without residual deficits: Secondary | ICD-10-CM | POA: Insufficient documentation

## 2017-12-26 MED ORDER — FUROSEMIDE 40 MG PO TABS: 40.0000 mg | ORAL_TABLET | Freq: Every day | ORAL | 3 refills | Status: DC

## 2017-12-26 NOTE — Patient Instructions (Addendum)
Resume weighing daily and call for an overnight weight gain of > 2 pounds or a weekly weight gain of >5 pounds.  Today only take an extra fluid pill (furosemide) and an extra potassium pill.

## 2018-01-26 ENCOUNTER — Emergency Department
Admission: EM | Admit: 2018-01-26 | Discharge: 2018-01-26 | Disposition: A | Discharge status: Home / Self Care | Payer: Medicaid Other | Attending: Emergency Medicine | Admitting: Emergency Medicine

## 2018-01-26 ENCOUNTER — Other Ambulatory Visit: Payer: Self-pay

## 2018-01-26 ENCOUNTER — Encounter: Payer: Self-pay | Admitting: Emergency Medicine

## 2018-01-26 ENCOUNTER — Emergency Department: Payer: Medicaid Other

## 2018-01-26 DIAGNOSIS — Z87891 Personal history of nicotine dependence: Secondary | ICD-10-CM | POA: Insufficient documentation

## 2018-01-26 DIAGNOSIS — R112 Nausea with vomiting, unspecified: Secondary | ICD-10-CM

## 2018-01-26 DIAGNOSIS — R109 Unspecified abdominal pain: Secondary | ICD-10-CM | POA: Diagnosis present

## 2018-01-26 DIAGNOSIS — J189 Pneumonia, unspecified organism: Secondary | ICD-10-CM | POA: Insufficient documentation

## 2018-01-26 DIAGNOSIS — J181 Lobar pneumonia, unspecified organism: Secondary | ICD-10-CM

## 2018-01-26 LAB — CBC WITH DIFFERENTIAL/PLATELET
Abs Immature Granulocytes: 0.02 10*3/uL (ref 0.00–0.07)
BASOS ABS: 0 10*3/uL (ref 0.0–0.1)
Basophils Relative: 0 %
EOS ABS: 0.1 10*3/uL (ref 0.0–0.5)
EOS PCT: 2 %
HEMATOCRIT: 40 % (ref 36.0–46.0)
Hemoglobin: 12.6 g/dL (ref 12.0–15.0)
IMMATURE GRANULOCYTES: 0 %
LYMPHS ABS: 1.4 10*3/uL (ref 0.7–4.0)
Lymphocytes Relative: 19 %
MCH: 27.5 pg (ref 26.0–34.0)
MCHC: 31.5 g/dL (ref 30.0–36.0)
MCV: 87.1 fL (ref 80.0–100.0)
Monocytes Absolute: 0.9 10*3/uL (ref 0.1–1.0)
Monocytes Relative: 12 %
NEUTROS PCT: 67 %
NRBC: 0 % (ref 0.0–0.2)
Neutro Abs: 4.6 10*3/uL (ref 1.7–7.7)
PLATELETS: 256 10*3/uL (ref 150–400)
RBC: 4.59 MIL/uL (ref 3.87–5.11)
RDW: 14.6 % (ref 11.5–15.5)
WBC: 7 10*3/uL (ref 4.0–10.5)

## 2018-01-26 LAB — COMPREHENSIVE METABOLIC PANEL
ALBUMIN: 3.7 g/dL (ref 3.5–5.0)
ALT: 13 U/L (ref 0–44)
ANION GAP: 9 (ref 5–15)
AST: 16 U/L (ref 15–41)
Alkaline Phosphatase: 76 U/L (ref 38–126)
BILIRUBIN TOTAL: 0.5 mg/dL (ref 0.3–1.2)
BUN: 18 mg/dL (ref 6–20)
CHLORIDE: 106 mmol/L (ref 98–111)
CO2: 27 mmol/L (ref 22–32)
Calcium: 9 mg/dL (ref 8.9–10.3)
Creatinine, Ser: 0.65 mg/dL (ref 0.44–1.00)
GFR calc Af Amer: >60 mL/min (ref >60)
GFR calc non Af Amer: >60 mL/min (ref >60)
GLUCOSE: 121 mg/dL — AB (ref 70–99)
Potassium: 3.1 mmol/L — ABNORMAL LOW (ref 3.5–5.1)
Sodium: 142 mmol/L (ref 135–145)
TOTAL PROTEIN: 7.3 g/dL (ref 6.5–8.1)

## 2018-01-26 LAB — URINALYSIS, COMPLETE (UACMP) WITH MICROSCOPIC
BILIRUBIN URINE: NEGATIVE
Glucose, UA: NEGATIVE mg/dL
KETONES UR: NEGATIVE mg/dL
Leukocytes, UA: NEGATIVE
NITRITE: NEGATIVE
PH: 5 (ref 5.0–8.0)
Protein, ur: 30 mg/dL — AB
SPECIFIC GRAVITY, URINE: 1.019 (ref 1.005–1.030)

## 2018-01-26 LAB — LIPASE, BLOOD: LIPASE: 27 U/L (ref 11–51)

## 2018-01-26 LAB — INFLUENZA PANEL BY PCR (TYPE A & B)
Influenza A By PCR: NEGATIVE
Influenza B By PCR: NEGATIVE

## 2018-01-26 LAB — BRAIN NATRIURETIC PEPTIDE: B Natriuretic Peptide: 46 pg/mL (ref 0.0–100.0)

## 2018-01-26 MED ORDER — ONDANSETRON 4 MG PO TBDP: 4.0000 mg | ORAL_TABLET | Freq: Three times a day (TID) | ORAL | 0 refills | Status: DC | PRN

## 2018-01-26 MED ORDER — SODIUM CHLORIDE 0.9 % IV SOLN
Freq: Once | INTRAVENOUS | Status: AC
  Administered 2018-01-26: 07:00:00 via INTRAVENOUS

## 2018-01-26 MED ORDER — ONDANSETRON HCL 4 MG/2ML IJ SOLN
4.0000 mg | Freq: Once | INTRAMUSCULAR | Status: AC
  Administered 2018-01-26: 4 mg via INTRAVENOUS
  Filled 2018-01-26: qty 2

## 2018-01-26 MED ORDER — LEVOFLOXACIN 750 MG PO TABS: 750.0000 mg | ORAL_TABLET | Freq: Every day | ORAL | 0 refills | Status: DC

## 2018-01-26 MED ORDER — LEVOFLOXACIN IN D5W 750 MG/150ML IV SOLN
750.0000 mg | Freq: Once | INTRAVENOUS | Status: AC
  Administered 2018-01-26: 750 mg via INTRAVENOUS
  Filled 2018-01-26: qty 150

## 2018-01-26 MED ORDER — HYDROCOD POLST-CPM POLST ER 10-8 MG/5ML PO SUER: 5.0000 mL | Freq: Two times a day (BID) | ORAL | 0 refills | Status: DC

## 2018-01-26 MED ORDER — KETOROLAC TROMETHAMINE 30 MG/ML IJ SOLN
30.0000 mg | Freq: Once | INTRAMUSCULAR | Status: AC
  Administered 2018-01-26: 30 mg via INTRAVENOUS
  Filled 2018-01-26: qty 1

## 2018-01-26 NOTE — ED Notes (Signed)
Pt oxygen saturation at 90% on room. Pt informed this RN that she wears 2L nasal cannula at home when she feels SOB. This RN placed pt on 2L nasal cannula. Pt oxygen saturation now 96% on 2L. MD aware.

## 2018-01-26 NOTE — ED Triage Notes (Addendum)
Pt to triage via w/c with no distress noted, brought in by EMS with reports of "flu-like symptoms" x 3 days; mask in place; pt c/o N/V and generalized abd pain; pt with prod cough and SHOB; pt with CHF, recently seen PCP and told she had fluid "on her belly"

## 2018-01-26 NOTE — ED Provider Notes (Signed)
[facility] Emergency Department Provider Note       Time seen: ----------------------------------------- 7:29 AM on 01/26/2018 -----------------------------------------   I have reviewed the triage vital signs and the nursing notes.  HISTORY   Chief Complaint Emesis and Abdominal Pain    HPI [name] is a 53 y.o. female with a history of atrial fibrillation, alcohol abuse, CHF, COPD, GERD, hypertension, obesity, PE who presents to the ED for flulike symptoms for the past 3 days.  Patient planes of nausea vomiting as well as abdominal pain that is worse when she coughs.  She also has productive cough and shortness of breath.  She has a history of diastolic heart failure.  She wears 2 L of nasal cannula oxygen all the time.  Past Medical History:  Diagnosis Date  . A-fib ([place]) 2010  . Adrenal adenoma, left   . Alcohol abuse   . Allergy   . CHF (congestive heart failure) ([place])   . COPD (chronic obstructive pulmonary disease) ([place])   . Fatty liver   . GERD (gastroesophageal reflux disease)   . Gout   . Hypertension   . Mild cognitive impairment   . Non-healing non-surgical wound    right leg  . Obesity   . Pulmonary embolus ([place])   . Sleep apnea    uses Cpap  . Stroke ([place])   . Thyroid disease     Patient Active Problem List   Diagnosis Date Noted  . Gout 05/09/2017  . COPD (chronic obstructive pulmonary disease) ([place]) 04/10/2017  . Sleep apnea 04/10/2017  . History of pulmonary embolism 04/10/2017  . History of thyroid disease 04/10/2017  . Hyperlipidemia 12/31/2016  . Essential hypertension 05/26/2016  . Chronic diastolic heart failure ([place]) 05/26/2016  . Hypokalemia 05/26/2016  . A-fib ([place]) 01/26/2008    Past Surgical History:  Procedure Laterality Date  . bilateral wrist fractures  2010  . lt ankle fracture  2002    Allergies Morphine and related and Penicillins  Social History Social History   Tobacco Use  .  Smoking status: Former Smoker    Types: Cigarettes    Last attempt to quit: 01/26/2016    Years since quitting: 2.0  . Smokeless tobacco: Never Used  Substance Use Topics  . Alcohol use: Not Currently    Comment: occ  . Drug use: No   Review of Systems Constitutional: Negative for fever. Cardiovascular: Negative for chest pain. Respiratory: Positive shortness of breath and cough Gastrointestinal: Positive for abdominal pain and vomiting Musculoskeletal: Negative for back pain. Skin: Negative for rash. Neurological: Negative for headaches, focal weakness or numbness.  All systems negative/normal/unremarkable except as stated in the HPI  ____________________________________________   PHYSICAL EXAM:  VITAL SIGNS: ED Triage Vitals  Enc Vitals Group     BP 01/26/18 0439 (!) 163/112     Pulse Rate 01/26/18 0439 (!) 106     Resp 01/26/18 0439 20     Temp 01/26/18 0439 99 F (37.2 C)     Temp Source 01/26/18 0439 Oral     SpO2 01/26/18 0439 92 %     Weight 01/26/18 0440 240 lb (108.9 kg)     Height 01/26/18 0440 5\' 10"  (1.778 m)     Head Circumference --      Peak Flow --      Pain Score 01/26/18 0440 9     Pain Loc --      Pain Edu? --  Excl. in [place]? --    Constitutional: Alert and oriented.  No acute distress Eyes: Conjunctivae are normal. Normal extraocular movements. ENT      Head: Normocephalic and atraumatic.      Nose: No congestion/rhinnorhea.      Mouth/Throat: Mucous membranes are moist.      Neck: No stridor. Cardiovascular: Normal rate, regular rhythm. No murmurs, rubs, or gallops. Respiratory: Normal respiratory effort without tachypnea nor retractions. Breath sounds are clear and equal bilaterally.  No obvious wheezing, rales or rhonchi Gastrointestinal: Soft and nontender. Normal bowel sounds Musculoskeletal: Nontender with normal range of motion in extremities. No lower extremity tenderness nor edema. Neurologic:  Normal speech and language. No gross  focal neurologic deficits are appreciated.  Skin:  Skin is warm, dry and intact. No rash noted. Psychiatric: Mood and affect are normal. Speech and behavior are normal.  ____________________________________________  EKG: Interpreted by me.  Sinus tachycardia with PACs, rate is 106 bpm, normal QRS, normal QT, leftward axis  ____________________________________________  ED COURSE:  As part of my medical decision making, I reviewed the following data within the [place] History obtained from family if available, nursing notes, old chart and ekg, as well as notes from prior ED visits. Patient presented for flulike symptoms, we will assess with labs and imaging as indicated at this time.   Procedures ____________________________________________   LABS (pertinent positives/negatives)  Labs Reviewed  COMPREHENSIVE METABOLIC PANEL - Abnormal; Notable for the following components:      Result Value   Potassium 3.1 (*)    Glucose, Bld 121 (*)    All other components within normal limits  URINALYSIS, COMPLETE (UACMP) WITH MICROSCOPIC - Abnormal; Notable for the following components:   Color, Urine YELLOW (*)    APPearance HAZY (*)    Hgb urine dipstick SMALL (*)    Protein, ur 30 (*)    Bacteria, UA RARE (*)    All other components within normal limits  CBC WITH DIFFERENTIAL/PLATELET  LIPASE, BLOOD  INFLUENZA PANEL BY PCR (TYPE A & B)  BRAIN NATRIURETIC PEPTIDE    RADIOLOGY  Chest x-ray IMPRESSION: Left upper lung zone and retrocardiac airspace opacity is compatible with pneumonia.  ____________________________________________   DIFFERENTIAL DIAGNOSIS   Influenza, viral illness, bronchitis, pneumonia, dehydration, electrolyte abnormality  FINAL ASSESSMENT AND PLAN  Community-acquired pneumonia   Plan: The patient had presented for flulike symptoms. Patient's labs are overall reassuring. Patient's imaging did reveal a left upper lung and retrocardiac  airspace opacity consistent with pneumonia.  She was given fluids here as well as Zofran, Toradol and IV Levaquin.  She will be discharged home with Levaquin and cough medicine with Zofran as needed.  She is cleared for outpatient follow-up on her nasal cannula oxygen.   Laurence Aly, MD    Note: This note was generated in part or whole with voice recognition software. Voice recognition is usually quite accurate but there are transcription errors that can and very often do occur. I apologize for any typographical errors that were not detected and corrected.     Earleen Newport, MD 01/26/18 [phone]

## 2018-02-13 ENCOUNTER — Ambulatory Visit: Payer: Medicaid Other | Admitting: Family Medicine

## 2018-02-17 ENCOUNTER — Ambulatory Visit: Payer: Self-pay | Admitting: *Deleted

## 2018-02-17 ENCOUNTER — Encounter: Payer: Self-pay | Admitting: Family Medicine

## 2018-02-17 ENCOUNTER — Ambulatory Visit: Payer: Medicaid Other | Admitting: Family Medicine

## 2018-02-17 VITALS — BP 154/87 | HR 69 | Temp 98.2°F | Wt 242.9 lb

## 2018-02-17 DIAGNOSIS — M79671 Pain in right foot: Secondary | ICD-10-CM

## 2018-02-17 MED ORDER — PREDNISONE 20 MG PO TABS: 40.0000 mg | ORAL_TABLET | Freq: Every day | ORAL | 0 refills | Status: DC

## 2018-02-17 MED ORDER — GABAPENTIN 300 MG PO CAPS: 300.0000 mg | ORAL_CAPSULE | Freq: Every day | ORAL | 1 refills | Status: DC

## 2018-02-17 NOTE — Progress Notes (Signed)
BP (!) 154/87   Pulse 69   Temp 98.2 F (36.8 C) (Oral)   Wt 242 lb 14.4 oz (110.2 kg)   SpO2 92%   BMI 34.85 kg/m    Subjective:    Patient ID: [name], female    DOB: 04-24-1965, 53 y.o.   MRN: [id number]  HPI: [name] is a 53 y.o. female  Chief Complaint  Patient presents with  . Toe Pain    right foot, states she hurt her toe about 3 weeks ago. States her leg has started to swell as well    Here today for 3 weeks of right 5th toe pain, redness, and warmth. States the entire right leg seems to be swelling these days off and on but this toe has been this way consistently. Seems to be some better the past day or two. Does not feel consistent to past gout flares, no known injury or skin breakdown, no fevers, chills, sweats. Not taking anything for sxs other than her current medications.   Relevant past medical, surgical, family and social history reviewed and updated as indicated. Interim medical history since our last visit reviewed. Allergies and medications reviewed and updated.  Review of Systems  Per HPI unless specifically indicated above     Objective:    BP (!) 154/87   Pulse 69   Temp 98.2 F (36.8 C) (Oral)   Wt 242 lb 14.4 oz (110.2 kg)   SpO2 92%   BMI 34.85 kg/m   Wt Readings from Last 3 Encounters:  02/17/18 242 lb 14.4 oz (110.2 kg)  01/26/18 240 lb (108.9 kg)  12/26/17 240 lb 6 oz (109 kg)    Physical Exam Vitals signs and nursing note reviewed.  Constitutional:      Appearance: Normal appearance. She is not ill-appearing.  HENT:     Head: Atraumatic.  Eyes:     Extraocular Movements: Extraocular movements intact.     Conjunctiva/sclera: Conjunctivae normal.  Neck:     Musculoskeletal: Normal range of motion and neck supple.  Cardiovascular:     Rate and Rhythm: Normal rate and regular rhythm.     Pulses: Normal pulses.     Heart sounds: Normal heart sounds.  Pulmonary:     Effort: Pulmonary effort is normal.     Breath  sounds: Normal breath sounds.  Musculoskeletal: Normal range of motion.     Comments: No noticeable asymmetry of LE edema, trace b/l LE's Neg squeeze test, neg homan's sign  Skin:    General: Skin is warm and dry.     Comments: Right 5th toe diffusely erythematous, mildly edematous, and warm to touch. Good ROM, non tender to palpation  Neurological:     Mental Status: She is alert and oriented to person, place, and time.  Psychiatric:        Mood and Affect: Mood normal.        Thought Content: Thought content normal.        Judgment: Judgment normal.     Results for orders placed or performed during the hospital encounter of 01/26/18  CBC with Differential  Result Value Ref Range   WBC 7.0 4.0 - 10.5 K/uL   RBC 4.59 3.87 - 5.11 MIL/uL   Hemoglobin 12.6 12.0 - 15.0 g/dL   HCT 40.0 36.0 - 46.0 %   MCV 87.1 80.0 - 100.0 fL   MCH 27.5 26.0 - 34.0 pg   MCHC 31.5 30.0 - 36.0 g/dL  RDW 14.6 11.5 - 15.5 %   Platelets 256 150 - 400 K/uL   nRBC 0.0 0.0 - 0.2 %   Neutrophils Relative % 67 %   Neutro Abs 4.6 1.7 - 7.7 K/uL   Lymphocytes Relative 19 %   Lymphs Abs 1.4 0.7 - 4.0 K/uL   Monocytes Relative 12 %   Monocytes Absolute 0.9 0.1 - 1.0 K/uL   Eosinophils Relative 2 %   Eosinophils Absolute 0.1 0.0 - 0.5 K/uL   Basophils Relative 0 %   Basophils Absolute 0.0 0.0 - 0.1 K/uL   Immature Granulocytes 0 %   Abs Immature Granulocytes 0.02 0.00 - 0.07 K/uL  Comprehensive metabolic panel  Result Value Ref Range   Sodium 142 135 - 145 mmol/L   Potassium 3.1 (L) 3.5 - 5.1 mmol/L   Chloride 106 98 - 111 mmol/L   CO2 27 22 - 32 mmol/L   Glucose, Bld 121 (H) 70 - 99 mg/dL   BUN 18 6 - 20 mg/dL   Creatinine, Ser 0.65 0.44 - 1.00 mg/dL   Calcium 9.0 8.9 - 10.3 mg/dL   Total Protein 7.3 6.5 - 8.1 g/dL   Albumin 3.7 3.5 - 5.0 g/dL   AST 16 15 - 41 U/L   ALT 13 0 - 44 U/L   Alkaline Phosphatase 76 38 - 126 U/L   Total Bilirubin 0.5 0.3 - 1.2 mg/dL   GFR calc non Af Amer >60 >60  mL/min   GFR calc Af Amer >60 >60 mL/min   Anion gap 9 5 - 15  Lipase, blood  Result Value Ref Range   Lipase 27 11 - 51 U/L  Urinalysis, Complete w Microscopic  Result Value Ref Range   Color, Urine YELLOW (A) YELLOW   APPearance HAZY (A) CLEAR   Specific Gravity, Urine 1.019 1.005 - 1.030   pH 5.0 5.0 - 8.0   Glucose, UA NEGATIVE NEGATIVE mg/dL   Hgb urine dipstick SMALL (A) NEGATIVE   Bilirubin Urine NEGATIVE NEGATIVE   Ketones, ur NEGATIVE NEGATIVE mg/dL   Protein, ur 30 (A) NEGATIVE mg/dL   Nitrite NEGATIVE NEGATIVE   Leukocytes, UA NEGATIVE NEGATIVE   RBC / HPF 0-5 0 - 5 RBC/hpf   WBC, UA 0-5 0 - 5 WBC/hpf   Bacteria, UA RARE (A) NONE SEEN   Squamous Epithelial / LPF 6-10 0 - 5   Mucus PRESENT   Influenza panel by PCR (type A & B)  Result Value Ref Range   Influenza A By PCR NEGATIVE NEGATIVE   Influenza B By PCR NEGATIVE NEGATIVE  Brain natriuretic peptide  Result Value Ref Range   B Natriuretic Peptide 46.0 0.0 - 100.0 pg/mL      Assessment & Plan:   Problem List Items Addressed This Visit    None    Visit Diagnoses    Right foot pain    -  Primary   Continue allopurinol, start prednisone burst and epsom salt soaks and monitor for improvement. Obtain x-ray if no better   Relevant Orders   DG Foot Complete Right       Follow up plan: Return if symptoms worsen or fail to improve.

## 2018-02-17 NOTE — Telephone Encounter (Signed)
Pt states that she was bitten by a spider last year but the area is healed; she now how swelling on her pinky toe and now she has a burning sensation, and the swelling has to her hip; the area is painful; she says that her toe is red; the swelling in her toe has been for 2-3 months; the swelling above her knee started 2 weeks ago;  recommendations made per nurse triage protocol; pt offered and accepted appointment with Merrie Roof, [place], 02/17/2018 at 1300; she verbalized understanding; will cancel previously scheduled appointment 02/20/2018; will also route to office for notification.  Reason for Disposition . [1] MODERATE leg swelling (e.g., swelling extends up to knees) AND [2] new onset or worsening  Answer Assessment - Initial Assessment Questions 1. ONSET: "When did the swelling start?" (e.g., minutes, hours, days)    2 weeks for leg; 2-3 months for right pinky toe 2. LOCATION: "What part of the leg is swollen?"  "Are both legs swollen or just one leg?"     Right leg 3. SEVERITY: "How bad is the swelling?" (e.g., localized; mild, moderate, severe)  - Localized - small area of swelling localized to one leg  - MILD pedal edema - swelling limited to foot and ankle, pitting edema < 1/4 inch (6 mm) deep, rest and elevation eliminate most or all swelling  - MODERATE edema - swelling of lower leg to knee, pitting edema > 1/4 inch (6 mm) deep, rest and elevation only partially reduce swelling  - SEVERE edema - swelling extends above knee, facial or hand swelling present      moderate 4. REDNESS: "Does the swelling look red or infected?"     Yes  5. PAIN: "Is the swelling painful to touch?" If so, ask: "How painful is it?"   (Scale 1-10; mild, moderate or severe)     Mild pain 6. FEVER: "Do you have a fever?" If so, ask: "What is it, how was it measured, and when did it start?"      no 7. CAUSE: "What do you think is causing the leg swelling?"     Not sure 8. MEDICAL HISTORY: "Do you have a  history of heart failure, kidney disease, liver failure, or cancer?"     Heart failure 9. RECURRENT SYMPTOM: "Have you had leg swelling before?" If so, ask: "When was the last time?" "What happened that time?"     no 10. OTHER SYMPTOMS: "Do you have any other symptoms?" (e.g., chest pain, difficulty breathing)       no 11. PREGNANCY: "Is there any chance you are pregnant?" "When was your last menstrual period?"       No  Protocols used: LEG SWELLING AND EDEMA-A-AH

## 2018-02-20 ENCOUNTER — Ambulatory Visit: Payer: Medicaid Other | Admitting: Family Medicine

## 2018-03-25 ENCOUNTER — Emergency Department: Payer: Medicaid Other

## 2018-03-25 ENCOUNTER — Other Ambulatory Visit: Payer: Self-pay

## 2018-03-25 ENCOUNTER — Inpatient Hospital Stay
Admission: EM | Admit: 2018-03-25 | Discharge: 2018-03-27 | DRG: 287 | Disposition: A | Discharge status: Home / Self Care | Payer: Medicaid Other | Attending: Internal Medicine | Admitting: Internal Medicine

## 2018-03-25 DIAGNOSIS — Z86711 Personal history of pulmonary embolism: Secondary | ICD-10-CM

## 2018-03-25 DIAGNOSIS — R0600 Dyspnea, unspecified: Secondary | ICD-10-CM

## 2018-03-25 DIAGNOSIS — Z87891 Personal history of nicotine dependence: Secondary | ICD-10-CM | POA: Diagnosis not present

## 2018-03-25 DIAGNOSIS — R000 Tachycardia, unspecified: Secondary | ICD-10-CM | POA: Diagnosis present

## 2018-03-25 DIAGNOSIS — K760 Fatty (change of) liver, not elsewhere classified: Secondary | ICD-10-CM | POA: Diagnosis present

## 2018-03-25 DIAGNOSIS — R7989 Other specified abnormal findings of blood chemistry: Secondary | ICD-10-CM | POA: Diagnosis present

## 2018-03-25 DIAGNOSIS — Z885 Allergy status to narcotic agent status: Secondary | ICD-10-CM | POA: Diagnosis not present

## 2018-03-25 DIAGNOSIS — I4891 Unspecified atrial fibrillation: Secondary | ICD-10-CM | POA: Diagnosis present

## 2018-03-25 DIAGNOSIS — G3184 Mild cognitive impairment, so stated: Secondary | ICD-10-CM | POA: Diagnosis present

## 2018-03-25 DIAGNOSIS — E876 Hypokalemia: Secondary | ICD-10-CM | POA: Diagnosis present

## 2018-03-25 DIAGNOSIS — I5032 Chronic diastolic (congestive) heart failure: Secondary | ICD-10-CM | POA: Diagnosis present

## 2018-03-25 DIAGNOSIS — Z7952 Long term (current) use of systemic steroids: Secondary | ICD-10-CM | POA: Diagnosis not present

## 2018-03-25 DIAGNOSIS — I110 Hypertensive heart disease with heart failure: Secondary | ICD-10-CM | POA: Diagnosis present

## 2018-03-25 DIAGNOSIS — Z79899 Other long term (current) drug therapy: Secondary | ICD-10-CM

## 2018-03-25 DIAGNOSIS — J302 Other seasonal allergic rhinitis: Secondary | ICD-10-CM | POA: Diagnosis present

## 2018-03-25 DIAGNOSIS — M109 Gout, unspecified: Secondary | ICD-10-CM | POA: Diagnosis present

## 2018-03-25 DIAGNOSIS — Z8673 Personal history of transient ischemic attack (TIA), and cerebral infarction without residual deficits: Secondary | ICD-10-CM

## 2018-03-25 DIAGNOSIS — Z7982 Long term (current) use of aspirin: Secondary | ICD-10-CM

## 2018-03-25 DIAGNOSIS — E785 Hyperlipidemia, unspecified: Secondary | ICD-10-CM | POA: Diagnosis present

## 2018-03-25 DIAGNOSIS — R079 Chest pain, unspecified: Secondary | ICD-10-CM

## 2018-03-25 DIAGNOSIS — G4733 Obstructive sleep apnea (adult) (pediatric): Secondary | ICD-10-CM | POA: Diagnosis present

## 2018-03-25 DIAGNOSIS — K219 Gastro-esophageal reflux disease without esophagitis: Secondary | ICD-10-CM | POA: Diagnosis present

## 2018-03-25 DIAGNOSIS — Z880 Allergy status to penicillin: Secondary | ICD-10-CM

## 2018-03-25 DIAGNOSIS — Z86718 Personal history of other venous thrombosis and embolism: Secondary | ICD-10-CM | POA: Diagnosis not present

## 2018-03-25 DIAGNOSIS — J449 Chronic obstructive pulmonary disease, unspecified: Secondary | ICD-10-CM | POA: Diagnosis present

## 2018-03-25 DIAGNOSIS — I200 Unstable angina: Principal | ICD-10-CM | POA: Diagnosis present

## 2018-03-25 LAB — BASIC METABOLIC PANEL
Anion gap: 9 (ref 5–15)
BUN: 14 mg/dL (ref 6–20)
CALCIUM: 8.5 mg/dL — AB (ref 8.9–10.3)
CO2: 24 mmol/L (ref 22–32)
Chloride: 109 mmol/L (ref 98–111)
Creatinine, Ser: 0.65 mg/dL (ref 0.44–1.00)
GFR calc non Af Amer: >60 mL/min (ref >60)
Glucose, Bld: 83 mg/dL (ref 70–99)
Potassium: 3 mmol/L — ABNORMAL LOW (ref 3.5–5.1)
Sodium: 142 mmol/L (ref 135–145)

## 2018-03-25 LAB — CBC
HCT: 42.9 % (ref 36.0–46.0)
Hemoglobin: 13.6 g/dL (ref 12.0–15.0)
MCH: 27.6 pg (ref 26.0–34.0)
MCHC: 31.7 g/dL (ref 30.0–36.0)
MCV: 87.2 fL (ref 80.0–100.0)
Platelets: 295 10*3/uL (ref 150–400)
RBC: 4.92 MIL/uL (ref 3.87–5.11)
RDW: 15 % (ref 11.5–15.5)
WBC: 13.2 10*3/uL — AB (ref 4.0–10.5)
nRBC: 0 % (ref 0.0–0.2)

## 2018-03-25 LAB — PROTIME-INR
INR: 0.9 (ref 0.8–1.2)
Prothrombin Time: 11.9 seconds (ref 11.4–15.2)

## 2018-03-25 LAB — LIPID PANEL
Cholesterol: 192 mg/dL (ref 0–200)
HDL: 61 mg/dL (ref >40)
LDL Cholesterol: 111 mg/dL — ABNORMAL HIGH (ref 0–99)
Total CHOL/HDL Ratio: 3.1 RATIO
Triglycerides: 100 mg/dL (ref <150)
VLDL: 20 mg/dL (ref 0–40)

## 2018-03-25 LAB — TROPONIN I
Troponin I: 0.04 ng/mL (ref <0.03)
Troponin I: 0.03 ng/mL (ref <0.03)

## 2018-03-25 LAB — APTT: aPTT: 24 seconds (ref 24–36)

## 2018-03-25 LAB — MAGNESIUM: Magnesium: 1.8 mg/dL (ref 1.7–2.4)

## 2018-03-25 MED ORDER — ONDANSETRON HCL 4 MG/2ML IJ SOLN
4.0000 mg | Freq: Once | INTRAMUSCULAR | Status: AC
  Administered 2018-03-25: 4 mg via INTRAVENOUS

## 2018-03-25 MED ORDER — SODIUM CHLORIDE 0.9% FLUSH
3.0000 mL | INTRAVENOUS | Status: DC | PRN
  Administered 2018-03-25 – 2018-03-26 (×2): 3 mL via INTRAVENOUS
  Filled 2018-03-25 (×2): qty 3

## 2018-03-25 MED ORDER — POLYETHYLENE GLYCOL 3350 17 G PO PACK: 17.0000 g | PACK | Freq: Every day | ORAL | Status: DC | PRN

## 2018-03-25 MED ORDER — FUROSEMIDE 40 MG PO TABS
40.0000 mg | ORAL_TABLET | Freq: Every day | ORAL | Status: DC
  Administered 2018-03-25 – 2018-03-27 (×3): 40 mg via ORAL
  Filled 2018-03-25 (×4): qty 1

## 2018-03-25 MED ORDER — ALBUTEROL SULFATE (2.5 MG/3ML) 0.083% IN NEBU: 3.0000 mL | INHALATION_SOLUTION | Freq: Four times a day (QID) | RESPIRATORY_TRACT | Status: DC | PRN

## 2018-03-25 MED ORDER — HYDRALAZINE HCL 20 MG/ML IJ SOLN: 10.0000 mg | Freq: Four times a day (QID) | INTRAMUSCULAR | Status: DC | PRN

## 2018-03-25 MED ORDER — ALLOPURINOL 100 MG PO TABS
100.0000 mg | ORAL_TABLET | Freq: Every day | ORAL | Status: DC
  Administered 2018-03-26 – 2018-03-27 (×2): 100 mg via ORAL
  Filled 2018-03-25 (×3): qty 1

## 2018-03-25 MED ORDER — ONDANSETRON HCL 4 MG/2ML IJ SOLN: 4.0000 mg | Freq: Four times a day (QID) | INTRAMUSCULAR | Status: DC | PRN

## 2018-03-25 MED ORDER — POTASSIUM CHLORIDE CRYS ER 20 MEQ PO TBCR
40.0000 meq | EXTENDED_RELEASE_TABLET | Freq: Once | ORAL | Status: AC
  Administered 2018-03-25: 40 meq via ORAL
  Filled 2018-03-25: qty 2

## 2018-03-25 MED ORDER — ONDANSETRON HCL 4 MG/2ML IJ SOLN
INTRAMUSCULAR | Status: AC
  Filled 2018-03-25: qty 2

## 2018-03-25 MED ORDER — SODIUM CHLORIDE 0.9 % IV SOLN: 250.0000 mL | INTRAVENOUS | Status: DC | PRN

## 2018-03-25 MED ORDER — ATORVASTATIN CALCIUM 20 MG PO TABS: 40.0000 mg | ORAL_TABLET | Freq: Every day | ORAL | Status: DC

## 2018-03-25 MED ORDER — SODIUM CHLORIDE 0.9% FLUSH
3.0000 mL | Freq: Two times a day (BID) | INTRAVENOUS | Status: DC
  Administered 2018-03-26: 3 mL via INTRAVENOUS

## 2018-03-25 MED ORDER — NITROGLYCERIN 0.4 MG SL SUBL: 0.4000 mg | SUBLINGUAL_TABLET | SUBLINGUAL | Status: DC | PRN

## 2018-03-25 MED ORDER — CLONIDINE HCL 0.1 MG PO TABS
0.1000 mg | ORAL_TABLET | Freq: Two times a day (BID) | ORAL | Status: DC
  Administered 2018-03-25 – 2018-03-27 (×4): 0.1 mg via ORAL
  Filled 2018-03-25 (×4): qty 1

## 2018-03-25 MED ORDER — IOHEXOL 350 MG/ML SOLN
75.0000 mL | Freq: Once | INTRAVENOUS | Status: AC | PRN
  Administered 2018-03-25: 75 mL via INTRAVENOUS

## 2018-03-25 MED ORDER — GABAPENTIN 300 MG PO CAPS
300.0000 mg | ORAL_CAPSULE | Freq: Every day | ORAL | Status: DC
  Administered 2018-03-25 – 2018-03-26 (×2): 300 mg via ORAL
  Filled 2018-03-25 (×2): qty 1

## 2018-03-25 MED ORDER — SODIUM CHLORIDE 0.9 % IV BOLUS
1000.0000 mL | Freq: Once | INTRAVENOUS | Status: AC
  Administered 2018-03-25: 1000 mL via INTRAVENOUS

## 2018-03-25 MED ORDER — ACETAMINOPHEN 325 MG PO TABS: 650.0000 mg | ORAL_TABLET | Freq: Four times a day (QID) | ORAL | Status: DC | PRN

## 2018-03-25 MED ORDER — PANTOPRAZOLE SODIUM 40 MG PO TBEC
40.0000 mg | DELAYED_RELEASE_TABLET | Freq: Every day | ORAL | Status: DC
  Administered 2018-03-25 – 2018-03-27 (×3): 40 mg via ORAL
  Filled 2018-03-25 (×3): qty 1

## 2018-03-25 MED ORDER — ONDANSETRON HCL 4 MG PO TABS: 4.0000 mg | ORAL_TABLET | Freq: Four times a day (QID) | ORAL | Status: DC | PRN

## 2018-03-25 MED ORDER — BUTALBITAL-APAP-CAFFEINE 50-325-40 MG PO TABS
1.0000 | ORAL_TABLET | Freq: Four times a day (QID) | ORAL | Status: DC | PRN
  Administered 2018-03-25 – 2018-03-26 (×2): 1 via ORAL
  Filled 2018-03-25 (×2): qty 1

## 2018-03-25 MED ORDER — METOPROLOL SUCCINATE ER 100 MG PO TB24
200.0000 mg | ORAL_TABLET | Freq: Every day | ORAL | Status: DC
  Administered 2018-03-26 – 2018-03-27 (×2): 200 mg via ORAL
  Filled 2018-03-25: qty 4
  Filled 2018-03-25 (×2): qty 2

## 2018-03-25 MED ORDER — ASPIRIN EC 81 MG PO TBEC
81.0000 mg | DELAYED_RELEASE_TABLET | Freq: Every day | ORAL | Status: DC
  Administered 2018-03-26 – 2018-03-27 (×2): 81 mg via ORAL
  Filled 2018-03-25 (×3): qty 1

## 2018-03-25 MED ORDER — ATORVASTATIN CALCIUM 20 MG PO TABS
40.0000 mg | ORAL_TABLET | Freq: Every day | ORAL | Status: DC
  Administered 2018-03-26 – 2018-03-27 (×2): 40 mg via ORAL
  Filled 2018-03-25 (×3): qty 2

## 2018-03-25 MED ORDER — HEPARIN (PORCINE) 25000 UT/250ML-% IV SOLN
1200.0000 [IU]/h | INTRAVENOUS | Status: DC
  Administered 2018-03-25 – 2018-03-26 (×2): 1200 [IU]/h via INTRAVENOUS
  Filled 2018-03-25 (×2): qty 250

## 2018-03-25 MED ORDER — AMLODIPINE BESYLATE 10 MG PO TABS
10.0000 mg | ORAL_TABLET | Freq: Every day | ORAL | Status: DC
  Administered 2018-03-26 – 2018-03-27 (×2): 10 mg via ORAL
  Filled 2018-03-25 (×2): qty 1

## 2018-03-25 MED ORDER — PREDNISONE 20 MG PO TABS
40.0000 mg | ORAL_TABLET | Freq: Every day | ORAL | Status: DC
  Administered 2018-03-26: 40 mg via ORAL
  Filled 2018-03-25: qty 2

## 2018-03-25 MED ORDER — LISINOPRIL 20 MG PO TABS
20.0000 mg | ORAL_TABLET | Freq: Every day | ORAL | Status: DC
  Administered 2018-03-25 – 2018-03-27 (×3): 20 mg via ORAL
  Filled 2018-03-25: qty 2
  Filled 2018-03-25 (×2): qty 1

## 2018-03-25 MED ORDER — HEPARIN BOLUS VIA INFUSION
4000.0000 [IU] | Freq: Once | INTRAVENOUS | Status: AC
  Administered 2018-03-25: 4000 [IU] via INTRAVENOUS
  Filled 2018-03-25: qty 4000

## 2018-03-25 MED ORDER — ACETAMINOPHEN 650 MG RE SUPP: 650.0000 mg | Freq: Four times a day (QID) | RECTAL | Status: DC | PRN

## 2018-03-25 MED ORDER — COLCHICINE 0.6 MG PO TABS
0.6000 mg | ORAL_TABLET | Freq: Two times a day (BID) | ORAL | Status: DC
  Administered 2018-03-25 – 2018-03-27 (×4): 0.6 mg via ORAL
  Filled 2018-03-25 (×6): qty 1

## 2018-03-25 MED ORDER — HYDRALAZINE HCL 50 MG PO TABS
50.0000 mg | ORAL_TABLET | Freq: Three times a day (TID) | ORAL | Status: DC
  Administered 2018-03-25 – 2018-03-27 (×5): 50 mg via ORAL
  Filled 2018-03-25 (×5): qty 1

## 2018-03-25 MED ORDER — KETOROLAC TROMETHAMINE 30 MG/ML IJ SOLN
30.0000 mg | Freq: Once | INTRAMUSCULAR | Status: AC
  Administered 2018-03-25: 30 mg via INTRAVENOUS
  Filled 2018-03-25: qty 1

## 2018-03-25 NOTE — ED Notes (Signed)
Patient transported to CT 

## 2018-03-25 NOTE — ED Notes (Signed)
MD notified of trop of 0.04

## 2018-03-25 NOTE — ED Notes (Signed)
Jessica RN, aware of bed assigned  

## 2018-03-25 NOTE — ED Triage Notes (Addendum)
Pt comes from home via EMS after being called out for breathing issues. Pt was tachy with ems upon arrival with initial rate 150s-170s. Pt has had 324mg  aspirin and 574ml bolus and 2.5mg  of metorprolol which brought her rate down to 100. Pt has hx of CHF but no new swelling or weight gain. Pt also had a mechanical fall last week and c/o right lower breast/chest pain.

## 2018-03-25 NOTE — H&P (Signed)
[place] [facility] [place] NAME: [name]    MR#:  [id number]  DATE OF BIRTH:  [birth year]  DATE OF ADMISSION:  03/25/2018  PRIMARY CARE PHYSICIAN: Volney American, PA-C   REQUESTING/REFERRING PHYSICIAN: dr Virgie Dad  CHIEF COMPLAINT:   Chest pain HISTORY OF PRESENT ILLNESS:  [name]  is a 53 y.o. female with a known history ofchronic diastolic hert failure with pEF, PE and COPD who presents to ED via EMS due to substernal chset pain associated with SOB and palpitations reading to her right arm.  Her chest pain started around 1:00 PM.  Reports she was sitting at her house when the chest pain started.  Chest pain lasted approximately 25 minutes and resolved on its own.  No reported relieving or aggravating factors.  EMS reprots heart rate around 150 bpm. She was given metoprolol which improved her heart rate. She reported that her chest pain had improved, however while in the ED she had another episode of chest pain and her troponin is 0.4.  SHe is currently chest pain-free.  CT Chest was negative for PE/acute pathology.  EKG shows sinus tachycardia no ST elevation/depression.  She also reports that she fell a few days ago and had chest pain however her chest pain is located on the left side under her rib cage.  This is very different than the chest pain she is experiencing today. PAST MEDICAL HISTORY:   Past Medical History:  Diagnosis Date  . A-fib ([place]) 2010  . Adrenal adenoma, left   . Alcohol abuse   . Allergy   . CHF (congestive heart failure) ([place])   . COPD (chronic obstructive pulmonary disease) ([place])   . Fatty liver   . GERD (gastroesophageal reflux disease)   . Gout   . Hypertension   . Mild cognitive impairment   . Non-healing non-surgical wound    right leg  . Obesity   . Pulmonary embolus ([place])   . Sleep apnea    uses Cpap  . Stroke ([place])   . Thyroid disease     PAST SURGICAL HISTORY:   Past Surgical  History:  Procedure Laterality Date  . bilateral wrist fractures  2010  . lt ankle fracture  2002    SOCIAL HISTORY:   Social History   Tobacco Use  . Smoking status: Former Smoker    Types: Cigarettes    Last attempt to quit: 01/26/2016    Years since quitting: 2.1  . Smokeless tobacco: Never Used  Substance Use Topics  . Alcohol use: Not Currently    Comment: occ    FAMILY HISTORY:   Family History  Problem Relation Age of Onset  . Prostate cancer Father   . Rectal cancer Sister   . Breast cancer Sister   . Cancer Maternal Grandmother   . Cancer Maternal Grandfather     DRUG ALLERGIES:   Allergies  Allergen Reactions  . Morphine And Related Hives  . Penicillins Other (See Comments)    Painful urination Has patient had a PCN reaction causing immediate rash, facial/tongue/throat swelling, SOB or lightheadedness with hypotension: yes Has patient had a PCN reaction causing severe rash involving mucus membranes or skin necrosis: no Has patient had a PCN reaction that required hospitalization no Has patient had a PCN reaction occurring within the last 10 years: no If all of the above answers are "NO", then may proceed with Cephalosporin use.     REVIEW OF SYSTEMS:  Review of Systems  Constitutional: Positive for malaise/fatigue. Negative for chills and fever.  HENT: Negative.  Negative for ear discharge, ear pain, hearing loss, nosebleeds and sore throat.   Eyes: Negative.  Negative for blurred vision and pain.  Respiratory: Positive for shortness of breath. Negative for cough, hemoptysis and wheezing.   Cardiovascular: Positive for chest pain and palpitations. Negative for leg swelling.  Gastrointestinal: Negative.  Negative for abdominal pain, blood in stool, diarrhea, nausea and vomiting.  Genitourinary: Negative.  Negative for dysuria.  Musculoskeletal: Negative.  Negative for back pain.  Skin: Negative.   Neurological: Negative for dizziness, tremors, speech  change, focal weakness, seizures and headaches.  Endo/Heme/Allergies: Negative.  Does not bruise/bleed easily.  Psychiatric/Behavioral: Negative.  Negative for depression, hallucinations and suicidal ideas.    MEDICATIONS AT HOME:   Prior to Admission medications   Medication Sig Start Date End Date Taking? Authorizing Provider  albuterol (PROVENTIL HFA;VENTOLIN HFA) 108 (90 Base) MCG/ACT inhaler Inhale 2 puffs into the lungs every 6 (six) hours as needed for wheezing or shortness of breath.   Yes [provider]  allopurinol (ZYLOPRIM) 100 MG tablet Take 1 tablet (100 mg total) by mouth daily. 11/08/17  Yes Volney American, PA-C  amLODipine (NORVASC) 10 MG tablet Take 1 tablet (10 mg total) by mouth daily. 04/07/17  Yes Volney American, PA-C  aspirin EC 81 MG tablet Take 81 mg by mouth daily.   Yes [provider]  atorvastatin (LIPITOR) 40 MG tablet Take 1 tablet (40 mg total) by mouth daily. 11/14/17  Yes Volney American, PA-C  butalbital-acetaminophen-caffeine [place], [place]) [phone] MG tablet Take 1-2 tablets by mouth every 6 (six) hours as needed for headache. 04/12/17 04/12/18 Yes Loney Hering, MD  cloNIDine (CATAPRES) 0.1 MG tablet Take 1 tablet (0.1 mg total) by mouth 2 (two) times daily. 11/08/17  Yes Volney American, PA-C  colchicine 0.6 MG tablet Take 1 tablet (0.6 mg total) by mouth 2 (two) times daily. As needed for gout flares 04/19/17  Yes Volney American, PA-C  furosemide (LASIX) 40 MG tablet Take 1 tablet (40 mg total) by mouth daily. 12/26/17 03/26/18 Yes Hackney, Otila Kluver A, FNP  gabapentin (NEURONTIN) 300 MG capsule Take 1 capsule (300 mg total) by mouth at bedtime. 02/17/18  Yes Volney American, PA-C  hydrALAZINE (APRESOLINE) 50 MG tablet TAKE 1 TABLET BY MOUTH THREE TIMES A DAY 11/24/17  Yes Volney American, PA-C  lisinopril (PRINIVIL,ZESTRIL) 20 MG tablet Take 1 tablet (20 mg total) by mouth daily. 03/24/17   Yes Darylene Price A, FNP  metoprolol (TOPROL XL) 200 MG 24 hr tablet Take 1 tablet (200 mg total) by mouth daily. 12/01/17  Yes Hackney, Otila Kluver A, FNP  pantoprazole (PROTONIX) 40 MG tablet Take 1 tablet (40 mg total) by mouth daily. 11/08/17  Yes Volney American, PA-C  potassium chloride SA (KLOR-CON M20) 20 MEQ tablet Take 1 tablet (20 mEq total) by mouth daily. Patient taking differently: Take 20 mEq by mouth 2 (two) times daily.  03/24/17  Yes Alisa Graff, FNP  predniSONE (DELTASONE) 20 MG tablet Take 2 tablets (40 mg total) by mouth daily with breakfast. 02/17/18  Yes [place], [place]:  Blood pressure (!) 136/104, pulse 100, temperature (!) 97.3 F (36.3 C), temperature source Oral, resp. rate (!) 28, height 5\' 9"  (1.753 m), weight 109.8 kg, SpO2 93 %.  PHYSICAL EXAMINATION:   Physical Exam Constitutional:  General: She is not in acute distress. HENT:     Head: Normocephalic.  Eyes:     General: No scleral icterus. Neck:     Musculoskeletal: Normal range of motion and neck supple.     Vascular: No JVD.     Trachea: No tracheal deviation.  Cardiovascular:     Rate and Rhythm: Regular rhythm. Tachycardia present.     Heart sounds: Normal heart sounds. No murmur. No friction rub. No gallop.   Pulmonary:     Effort: Pulmonary effort is normal. No tachypnea or respiratory distress.     Breath sounds: Normal breath sounds. No wheezing or rales.  Chest:     Chest wall: No tenderness.  Abdominal:     General: Bowel sounds are normal. There is no distension.     Palpations: Abdomen is soft. There is no mass.     Tenderness: There is no abdominal tenderness. There is no guarding or rebound.  Musculoskeletal: Normal range of motion.  Skin:    General: Skin is warm.     Findings: No erythema or rash.     Comments: Bruising on her knees from her fall  Neurological:     General: No focal deficit present.     Mental Status: She is alert and  oriented to person, place, and time.  Psychiatric:        Mood and Affect: Mood is not anxious.        Behavior: Behavior is agitated.        Judgment: Judgment normal.       LABORATORY PANEL:   CBC Recent Labs  Lab 03/25/18 1520  WBC 13.2*  HGB 13.6  HCT 42.9  PLT 295   ------------------------------------------------------------------------------------------------------------------  Chemistries  Recent Labs  Lab 03/25/18 1724  NA 142  K 3.0*  CL 109  CO2 24  GLUCOSE 83  BUN 14  CREATININE 0.65  CALCIUM 8.5*   ------------------------------------------------------------------------------------------------------------------  Cardiac Enzymes Recent Labs  Lab 03/25/18 1724  TROPONINI 0.04*   ------------------------------------------------------------------------------------------------------------------  RADIOLOGY:  Dg Chest 2 View  Result Date: 03/25/2018 CLINICAL DATA:  Pt comes from home via EMS after being called out for breathing issues. Pt was tachy with ems upon arrival with initial rate 150s-170s. Pt has had 324mg  aspirin and 52ml bolus and 2.5mg  of metoprolol which brought her rate down. Fell last week. RIGHT LOWER breast/chest pain. History of alcohol abuse, smoking. EXAM: CHEST - 2 VIEW COMPARISON:  01/26/2018 FINDINGS: Heart is enlarged. There is tortuosity of the thoracic aorta. There is mild prominence of interstitial markings. LEFT UPPER lobe opacity is consistent with scarring. Numerous remote bilateral rib fractures. No pulmonary edema. Further wedge compression fracture of T8. IMPRESSION: 1. Cardiomegaly. 2. LEFT UPPER lobe scarring. 3. Further wedge compression fracture of T8. Electronically Signed   By: Nolon Nations M.D.   On: 03/25/2018 16:00   Ct Angio Chest Pe W And/or Wo Contrast  Result Date: 03/25/2018 CLINICAL DATA:  Chest pain and difficulty breathing today. EXAM: CT ANGIOGRAPHY CHEST WITH CONTRAST TECHNIQUE: Multidetector CT  imaging of the chest was performed using the standard protocol during bolus administration of intravenous contrast. Multiplanar CT image reconstructions and MIPs were obtained to evaluate the vascular anatomy. CONTRAST:  40mL OMNIPAQUE IOHEXOL 350 MG/ML SOLN COMPARISON:  Chest CT 09/12/2012 FINDINGS: Cardiovascular: The heart is mildly enlarged. There is tortuosity and ectasia of the thoracic aorta. Scattered atherosclerotic calcifications. No dissection. Branch vessels are patent but are also very tortuous. No obvious coronary  artery calcifications. The pulmonary arterial tree is fairly well opacified. No filling defects to suggest pulmonary embolism. Mediastinum/Nodes: No mediastinal or hilar mass or adenopathy. Small scattered lymph nodes are noted. The esophagus is grossly normal. Lungs/Pleura: No acute pulmonary findings. No infiltrates, edema or effusions. Chronic scarring changes and loss of volume in the left hemithorax and chronic areas of bilateral pleural thickening. Upper Abdomen: No significant upper abdominal findings. Stable left adrenal gland nodule consistent with a benign adenoma. Musculoskeletal: Evidence of significant remote thoracic trauma with numerous bilateral healed rib fractures and healed clavicle fracture with massive callus formation. Review of the MIP images confirms the above findings. IMPRESSION: 1. No CT findings for acute pulmonary embolism. 2. Tortuosity and ectasia of the thoracic aorta but no focal aneurysm or dissection. 3. No mediastinal or hilar mass or adenopathy. 4. No acute pulmonary findings.  Chronic pulmonary scarring changes. 5. Evidence of severe remote trauma to the bony thorax. 6. Stable left adrenal gland adenoma. Aortic Atherosclerosis (ICD10-I70.0). Electronically Signed   By: Marijo Sanes M.D.   On: 03/25/2018 17:30    EKG:  Sinus tachycardia no ST elevation or depression   IMPRESSION AND PLAN:   53 y/o female with history of diastolic congestive heart  failure, with questionable history of atrial fibrillation, hypertension, history of pulmonary embolism/DVT, ischemic stroke in 2010, dyslipidemia, obstructive sleep apnea, and COPD on supplemental oxygen as needed who presents to ED with chest pain.  1. [place]: Patient with symptoms concerning for ACS. Start Heparin gtt [facility] cardiology consult Continue tele and follow troponins Continue ASA, statin, BB and statin NTG PRN  Check A1c and lipid panel  Further work-up pending troponins and cardiology Chest CT negative for PE   2. Hypokalemia: Check magnesium and replete  3. essential hypertension: Continue Norvasc, clonidine, hydralazine, lisinopril, metoprolol  4.  Chronic diastolic heart failure without signs of exacerbation: Continue Lasix 5. OSA: CPAP at night 6.  COPD without signs of exacerbation on PRN oxygen at home.   All the records are reviewed and case discussed with ED provider. Management plans discussed with the patient and she in agreement  CODE STATUS: full  TOTAL TIME TAKING CARE OF THIS PATIENT: 45 minutes.    Seanna Sisler M.D on 03/25/2018 at 8:20 PM  Between 7am to 6pm - Pager - [phone]  After 6pm go to [email or web address] - password [email or web address] [place] [facility]  Office  [phone]  CC: Primary care physician; Volney American, PA-C

## 2018-03-25 NOTE — Consult Note (Signed)
ANTICOAGULATION CONSULT NOTE - Initial Consult  Pharmacy Consult for Heparin Indication: chest pain/ACS/STEMI  Allergies  Allergen Reactions  . Morphine And Related Hives  . Penicillins Other (See Comments)    Painful urination Has patient had a PCN reaction causing immediate rash, facial/tongue/throat swelling, SOB or lightheadedness with hypotension: yes Has patient had a PCN reaction causing severe rash involving mucus membranes or skin necrosis: no Has patient had a PCN reaction that required hospitalization no Has patient had a PCN reaction occurring within the last 10 years: no If all of the above answers are "NO", then may proceed with Cephalosporin use.     Patient Measurements: Height: 5\' 9"  (175.3 cm) Weight: 242 lb (109.8 kg) IBW/kg (Calculated) : 66.2 Heparin Dosing Weight: 90.9  Vital Signs: Temp: 97.3 F (36.3 C) (02/29 1509) Temp Source: Oral (02/29 1509) BP: 136/104 (02/29 2000) Pulse Rate: 100 (02/29 2000)  Labs: Recent Labs    03/25/18 1520 03/25/18 1724 03/25/18 2007  HGB 13.6  --   --   HCT 42.9  --   --   PLT 295  --   --   CREATININE  --  0.65  --   TROPONINI  --  0.04* 0.03*    Estimated Creatinine Clearance: 108.6 mL/min (by C-G formula based on SCr of 0.65 mg/dL).   Medical History: Past Medical History:  Diagnosis Date  . A-fib ([place]) 2010  . Adrenal adenoma, left   . Alcohol abuse   . Allergy   . CHF (congestive heart failure) ([place])   . COPD (chronic obstructive pulmonary disease) ([place])   . Fatty liver   . GERD (gastroesophageal reflux disease)   . Gout   . Hypertension   . Mild cognitive impairment   . Non-healing non-surgical wound    right leg  . Obesity   . Pulmonary embolus ([place])   . Sleep apnea    uses Cpap  . Stroke ([place])   . Thyroid disease     Medications:  No PTA anticoagulants noted  Assessment: Pharmacy has been consulted for a heparin drip for ACS/STEMI  Goal of Therapy:  Heparin level 0.3-0.7  units/ml Monitor platelets by anticoagulation protocol: Yes   Plan:  Will give 4000 unit heparin bolus, followed by 1200 units/hour  Will check HL @ 0300 3/01 (in 6 hours) per protocol  Will monitor CBC's daily while on heparin  Lu Duffel, PharmD, BCPS Clinical Pharmacist 03/25/2018 9:00 PM

## 2018-03-25 NOTE — ED Notes (Signed)
attempted to call report at this time but rn unavailable

## 2018-03-25 NOTE — ED Notes (Signed)
Pt c/o burning in her chest again. MD notified. Pt also dropping sats in high 80s so pt placed on 2L O2.

## 2018-03-25 NOTE — Consult Note (Signed)
PHARMACY CONSULT NOTE - FOLLOW UP  Pharmacy Consult for Electrolyte Monitoring and Replacement   Recent Labs: Potassium (mmol/L)  Date Value  03/25/2018 3.0 (L)  01/15/2014 3.9   Magnesium (mg/dL)  Date Value  01/14/2014 1.8   Calcium (mg/dL)  Date Value  03/25/2018 8.5 (L)   Calcium, Total (mg/dL)  Date Value  01/15/2014 8.6   Albumin (g/dL)  Date Value  01/26/2018 3.7  11/08/2017 4.0  01/14/2014 3.2 (L)   Sodium (mmol/L)  Date Value  03/25/2018 142  11/08/2017 150 (H)  01/15/2014 139     Assessment: Patient is mildly hypokalemic with K = 3.0.   Pharmacy has been consulted to monitor and replenish electrolytes per consult  Goal of Therapy:  Electrolytes wnl's  Plan:  Will give 57meq oral x 2 and recheck potassium with am labs  Will also check magnesium with AM labs  Lu Duffel ,PharmD Clinical Pharmacist 03/25/2018 9:02 PM

## 2018-03-25 NOTE — ED Notes (Signed)
ED TO INPATIENT HANDOFF REPORT  Name/Age/Gender [name] 53 y.o. female  Code Status    Code Status Orders  (From admission, onward)         Start     Ordered   03/25/18 2019  Full code  Continuous     03/25/18 2019        Code Status History    This patient has a current code status but no historical code status.      Home/SNF/Other Home  Chief Complaint chest pain  Level of Care/Admitting Diagnosis ED Disposition    ED Disposition Condition [facility] Area: Sun [[id number]]  Level of Care: Telemetry [5]  Diagnosis: Unstable angina [facility]) [[id number]]  Admitting Physician: Bettey Costa [[id number]]  Attending Physician: MODY, Ulice Bold [[id number]]  Estimated length of stay: past midnight tomorrow  Certification:: I certify this patient will need inpatient services for at least 2 midnights  PT Class (Do Not Modify): Inpatient [101]  PT Acc Code (Do Not Modify): Private [1]       Medical History Past Medical History:  Diagnosis Date  . A-fib ([place]) 2010  . Adrenal adenoma, left   . Alcohol abuse   . Allergy   . CHF (congestive heart failure) ([place])   . COPD (chronic obstructive pulmonary disease) ([place])   . Fatty liver   . GERD (gastroesophageal reflux disease)   . Gout   . Hypertension   . Mild cognitive impairment   . Non-healing non-surgical wound    right leg  . Obesity   . Pulmonary embolus ([place])   . Sleep apnea    uses Cpap  . Stroke ([place])   . Thyroid disease     Allergies Allergies  Allergen Reactions  . Morphine And Related Hives  . Penicillins Other (See Comments)    Painful urination Has patient had a PCN reaction causing immediate rash, facial/tongue/throat swelling, SOB or lightheadedness with hypotension: yes Has patient had a PCN reaction causing severe rash involving mucus membranes or skin necrosis: no Has patient had a PCN reaction that required hospitalization no Has patient had a PCN reaction  occurring within the last 10 years: no If all of the above answers are "NO", then may proceed with Cephalosporin use.     IV Location/Drains/Wounds Patient Lines/Drains/Airways Status   Active Line/Drains/Airways    Name:   Placement date:   Placement time:   Site:   Days:   Peripheral IV 03/25/18 Left Hand   03/25/18    1514    Hand   less than 1   Peripheral IV 03/25/18 Left Arm   03/25/18    1746    Arm   less than 1          Labs/Imaging Results for orders placed or performed during the hospital encounter of 03/25/18 (from the past 48 hour(s))  CBC     Status: Abnormal   Collection Time: 03/25/18  3:20 PM  Result Value Ref Range   WBC 13.2 (H) 4.0 - 10.5 K/uL   RBC 4.92 3.87 - 5.11 MIL/uL   Hemoglobin 13.6 12.0 - 15.0 g/dL   HCT 42.9 36.0 - 46.0 %   MCV 87.2 80.0 - 100.0 fL   MCH 27.6 26.0 - 34.0 pg   MCHC 31.7 30.0 - 36.0 g/dL   RDW 15.0 11.5 - 15.5 %   Platelets 295 150 - 400 K/uL   nRBC 0.0 0.0 - 0.2 %  Comment: Performed at [facility], [address] [place], [place] [address]  Basic metabolic panel     Status: Abnormal   Collection Time: 03/25/18  5:24 PM  Result Value Ref Range   Sodium 142 135 - 145 mmol/L   Potassium 3.0 (L) 3.5 - 5.1 mmol/L   Chloride 109 98 - 111 mmol/L   CO2 24 22 - 32 mmol/L   Glucose, Bld 83 70 - 99 mg/dL   BUN 14 6 - 20 mg/dL   Creatinine, Ser 0.65 0.44 - 1.00 mg/dL   Calcium 8.5 (L) 8.9 - 10.3 mg/dL   GFR calc non Af Amer >60 >60 mL/min   GFR calc Af Amer >60 >60 mL/min   Anion gap 9 5 - 15    Comment: Performed at [facility], [address] [place], [place] [address]  Troponin I -     Status: Abnormal   Collection Time: 03/25/18  5:24 PM  Result Value Ref Range   Troponin I 0.04 (HH) <0.03 ng/mL    Comment: CRITICAL RESULT CALLED TO, READ BACK BY AND VERIFIED WITH JESSICA Kriste Broman AT 1805 03/25/2018.PMF Performed at [facility], [address] [place], [place] [address]   Troponin I -  Once-Timed     Status: Abnormal   Collection Time: 03/25/18  8:07 PM  Result Value Ref Range   Troponin I 0.03 (HH) <0.03 ng/mL    Comment: CRITICAL VALUE NOTED. VALUE IS CONSISTENT WITH PREVIOUSLY REPORTED/CALLED VALUE.MSS Performed at [facility], [address] [place], [place] [address]   Magnesium     Status: None   Collection Time: 03/25/18  8:07 PM  Result Value Ref Range   Magnesium 1.8 1.7 - 2.4 mg/dL    Comment: Performed at [facility], [address] [place], [place] [address]  Lipid panel     Status: Abnormal   Collection Time: 03/25/18  8:07 PM  Result Value Ref Range   Cholesterol 192 0 - 200 mg/dL   Triglycerides 100 <150 mg/dL   HDL 61 >40 mg/dL   Total CHOL/HDL Ratio 3.1 RATIO   VLDL 20 0 - 40 mg/dL   LDL Cholesterol 111 (H) 0 - 99 mg/dL    Comment:        Total Cholesterol/HDL:CHD Risk Coronary Heart Disease Risk Table                     Men   Women  1/2 Average Risk   3.4   3.3  Average Risk       5.0   4.4  2 X Average Risk   9.6   7.1  3 X Average Risk  23.4   11.0        Use the calculated Patient Ratio above and the CHD Risk Table to determine the patient's CHD Risk.        ATP III CLASSIFICATION (LDL):  <100     mg/dL   Optimal  100-129  mg/dL   Near or Above                    Optimal  130-159  mg/dL   Borderline  160-189  mg/dL   High  >190     mg/dL   Very High Performed at [facility], [address] [place], [place] [address]   APTT     Status: None   Collection Time: 03/25/18  9:13 PM  Result Value  Ref Range   aPTT 24 24 - 36 seconds    Comment: Performed at [facility], [address] [place], [place] [address]  Protime-INR     Status: None   Collection Time: 03/25/18  9:13 PM  Result Value Ref Range   Prothrombin Time 11.9 11.4 - 15.2 seconds   INR 0.9 0.8 - 1.2    Comment: (NOTE) INR goal varies based on device and disease states. Performed at [facility], [address] [place], [place] [address]    Dg Chest 2 View  Result Date: 03/25/2018 CLINICAL DATA:  Pt comes from home via EMS after being called out for breathing issues. Pt was tachy with ems upon arrival with initial rate 150s-170s. Pt has had 324mg  aspirin and 521ml bolus and 2.5mg  of metoprolol which brought her rate down. Fell last week. RIGHT LOWER breast/chest pain. History of alcohol abuse, smoking. EXAM: CHEST - 2 VIEW COMPARISON:  01/26/2018 FINDINGS: Heart is enlarged. There is tortuosity of the thoracic aorta. There is mild prominence of interstitial markings. LEFT UPPER lobe opacity is consistent with scarring. Numerous remote bilateral rib fractures. No pulmonary edema. Further wedge compression fracture of T8. IMPRESSION: 1. Cardiomegaly. 2. LEFT UPPER lobe scarring. 3. Further wedge compression fracture of T8. Electronically Signed   By: Nolon Nations M.D.   On: 03/25/2018 16:00   Ct Angio Chest Pe W And/or Wo Contrast  Result Date: 03/25/2018 CLINICAL DATA:  Chest pain and difficulty breathing today. EXAM: CT ANGIOGRAPHY CHEST WITH CONTRAST TECHNIQUE: Multidetector CT imaging of the chest was performed using the standard protocol during bolus administration of intravenous contrast. Multiplanar CT image reconstructions and MIPs were obtained to evaluate the vascular anatomy. CONTRAST:  59mL OMNIPAQUE IOHEXOL 350 MG/ML SOLN COMPARISON:  Chest CT 09/12/2012 FINDINGS: Cardiovascular: The heart is mildly enlarged. There is tortuosity and ectasia of the thoracic aorta. Scattered atherosclerotic calcifications. No dissection. Branch vessels are patent but are also very tortuous. No obvious coronary artery calcifications. The pulmonary arterial tree is fairly well opacified. No filling defects to suggest pulmonary embolism. Mediastinum/Nodes: No mediastinal or hilar mass or adenopathy. Small scattered lymph nodes are noted. The esophagus is grossly normal. Lungs/Pleura: No acute pulmonary findings. No  infiltrates, edema or effusions. Chronic scarring changes and loss of volume in the left hemithorax and chronic areas of bilateral pleural thickening. Upper Abdomen: No significant upper abdominal findings. Stable left adrenal gland nodule consistent with a benign adenoma. Musculoskeletal: Evidence of significant remote thoracic trauma with numerous bilateral healed rib fractures and healed clavicle fracture with massive callus formation. Review of the MIP images confirms the above findings. IMPRESSION: 1. No CT findings for acute pulmonary embolism. 2. Tortuosity and ectasia of the thoracic aorta but no focal aneurysm or dissection. 3. No mediastinal or hilar mass or adenopathy. 4. No acute pulmonary findings.  Chronic pulmonary scarring changes. 5. Evidence of severe remote trauma to the bony thorax. 6. Stable left adrenal gland adenoma. Aortic Atherosclerosis (ICD10-I70.0). Electronically Signed   By: Marijo Sanes M.D.   On: 03/25/2018 17:30    Pending Labs Unresulted Labs (From admission, onward)    Start     Ordered   03/26/18 0500  CBC  Tomorrow morning,   STAT     03/25/18 2019   03/26/18 6546  Basic metabolic panel  Tomorrow morning,   STAT     03/25/18 2019   03/26/18 0500  HIV antibody (Routine Testing)  Tomorrow morning,   STAT  03/25/18 2026   03/26/18 0500  Magnesium  Tomorrow morning,   STAT     03/25/18 2113   03/26/18 0300  Heparin level (unfractionated)  Once-Timed,   STAT     03/25/18 2057   03/25/18 2017  Troponin I - Now Then Q6H  Now then every 6 hours,   STAT     03/25/18 2019   03/25/18 1520  Hemoglobin A1c  Once,   AD     03/25/18 1520          Vitals/Pain Today's Vitals   03/25/18 2121 03/25/18 2130 03/25/18 2137 03/25/18 2200  BP: (!) 156/120 (!) 153/103  (!) 141/113  Pulse: 97 94  93  Resp: (!) 25 14  (!) 32  Temp:      TempSrc:      SpO2: 95% (!) 87%  95%  Weight:      Height:      PainSc:   7      Isolation Precautions No active  isolations  Medications Medications  aspirin EC tablet 81 mg (81 mg Oral Not Given 03/25/18 2110)  lisinopril (PRINIVIL,ZESTRIL) tablet 20 mg (20 mg Oral Given 03/25/18 2111)  amLODipine (NORVASC) tablet 10 mg (has no administration in time range)  butalbital-acetaminophen-caffeine (FIORICET, ESGIC) 50-325-40 MG per tablet 1-2 tablet (1 tablet Oral Given 03/25/18 2136)  colchicine tablet 0.6 mg (has no administration in time range)  cloNIDine (CATAPRES) tablet 0.1 mg (has no administration in time range)  pantoprazole (PROTONIX) EC tablet 40 mg (40 mg Oral Given 03/25/18 2111)  allopurinol (ZYLOPRIM) tablet 100 mg (has no administration in time range)  atorvastatin (LIPITOR) tablet 40 mg (has no administration in time range)  hydrALAZINE (APRESOLINE) tablet 50 mg (has no administration in time range)  metoprolol succinate (TOPROL-XL) 24 hr tablet 200 mg (has no administration in time range)  furosemide (LASIX) tablet 40 mg (40 mg Oral Given 03/25/18 2111)  albuterol (PROVENTIL) (2.5 MG/3ML) 0.083% nebulizer solution 3 mL (has no administration in time range)  predniSONE (DELTASONE) tablet 40 mg (has no administration in time range)  gabapentin (NEURONTIN) capsule 300 mg (has no administration in time range)  hydrALAZINE (APRESOLINE) injection 10 mg (has no administration in time range)  nitroGLYCERIN (NITROSTAT) SL tablet 0.4 mg (has no administration in time range)  sodium chloride flush (NS) 0.9 % injection 3 mL (has no administration in time range)  sodium chloride flush (NS) 0.9 % injection 3 mL (has no administration in time range)  0.9 %  sodium chloride infusion (has no administration in time range)  acetaminophen (TYLENOL) tablet 650 mg (has no administration in time range)    Or  acetaminophen (TYLENOL) suppository 650 mg (has no administration in time range)  ondansetron (ZOFRAN) tablet 4 mg (has no administration in time range)    Or  ondansetron (ZOFRAN) injection 4 mg (has no  administration in time range)  polyethylene glycol (MIRALAX / GLYCOLAX) packet 17 g (has no administration in time range)  heparin bolus via infusion 4,000 Units (4,000 Units Intravenous Bolus from Bag 03/25/18 2119)    Followed by  heparin ADULT infusion 100 units/mL (25000 units/240mL sodium chloride 0.45%) (1,200 Units/hr Intravenous New Bag/Given 03/25/18 2120)  potassium chloride SA (K-DUR,KLOR-CON) CR tablet 40 mEq (has no administration in time range)  sodium chloride 0.9 % bolus 1,000 mL (1,000 mLs Intravenous Bolus 03/25/18 1552)  iohexol (OMNIPAQUE) 350 MG/ML injection 75 mL (75 mLs Intravenous Contrast Given 03/25/18 1618)  ketorolac (TORADOL) 30 MG/ML injection  30 mg (30 mg Intravenous Given 03/25/18 1934)  ondansetron (ZOFRAN) injection 4 mg (4 mg Intravenous Given 03/25/18 1940)  potassium chloride SA (K-DUR,KLOR-CON) CR tablet 40 mEq (40 mEq Oral Given 03/25/18 2111)    Mobility indepedent

## 2018-03-25 NOTE — ED Notes (Signed)
Pt vomitting from the pain of the headache. MD notified.

## 2018-03-25 NOTE — ED Notes (Signed)
Pt assisted to bathroom

## 2018-03-25 NOTE — ED Provider Notes (Signed)
[facility] Emergency Department Provider Note  Time seen: 3:52 PM  I have reviewed the triage vital signs and the nursing notes.   HISTORY  Chief Complaint Tachycardia and Shortness of Breath    HPI [name] is a 53 y.o. female with a past medical history of atrial fibrillation, CHF, COPD, gastric reflux, hypertension, prior DVT on 81 mg aspirin, presents to the emergency department for acute onset of chest pain shortness of breath and rapid heart rate.  According to the patient around 1 PM today she had acute onset of chest pain and felt short of breath with a rapid heart rate.  EMS states upon their arrival patient was tachycardic greater than 150 bpm.  They gave the patient 2.5 mg of metoprolol with good response in her heart rate.  Upon arrival patient states her chest pain/discomfort has decreased from an 8/10 to currently a 3/10.  Denies any pleuritic pain.  Denies any leg pain or swelling.  Does state a history of a right lower extremity DVT years ago currently on 81 mg aspirin per patient.   Past Medical History:  Diagnosis Date  . A-fib ([place]) 2010  . Adrenal adenoma, left   . Alcohol abuse   . Allergy   . CHF (congestive heart failure) ([place])   . COPD (chronic obstructive pulmonary disease) ([place])   . Fatty liver   . GERD (gastroesophageal reflux disease)   . Gout   . Hypertension   . Mild cognitive impairment   . Non-healing non-surgical wound    right leg  . Obesity   . Pulmonary embolus ([place])   . Sleep apnea    uses Cpap  . Stroke ([place])   . Thyroid disease     Patient Active Problem List   Diagnosis Date Noted  . Gout 05/09/2017  . COPD (chronic obstructive pulmonary disease) ([place]) 04/10/2017  . Sleep apnea 04/10/2017  . History of pulmonary embolism 04/10/2017  . History of thyroid disease 04/10/2017  . Hyperlipidemia 12/31/2016  . Essential hypertension 05/26/2016  . Chronic diastolic heart failure ([place]) 05/26/2016  .  Hypokalemia 05/26/2016  . A-fib ([place]) 01/26/2008    Past Surgical History:  Procedure Laterality Date  . bilateral wrist fractures  2010  . lt ankle fracture  2002    Prior to Admission medications   Medication Sig Start Date End Date Taking? Authorizing Provider  albuterol (PROVENTIL HFA;VENTOLIN HFA) 108 (90 Base) MCG/ACT inhaler Inhale 2 puffs into the lungs every 6 (six) hours as needed for wheezing or shortness of breath.    [provider]  allopurinol (ZYLOPRIM) 100 MG tablet Take 1 tablet (100 mg total) by mouth daily. 11/08/17   Volney American, PA-C  amLODipine (NORVASC) 10 MG tablet Take 1 tablet (10 mg total) by mouth daily. 04/07/17   Volney American, PA-C  aspirin EC 81 MG tablet Take 81 mg by mouth daily.    [provider]  atorvastatin (LIPITOR) 40 MG tablet Take 1 tablet (40 mg total) by mouth daily. 11/14/17   Volney American, PA-C  butalbital-acetaminophen-caffeine [place], ESGIC) [phone] MG tablet Take 1-2 tablets by mouth every 6 (six) hours as needed for headache. 04/12/17 04/12/18  Loney Hering, MD  chlorpheniramine-HYDROcodone [facility] PENNKINETIC ER) 10-8 MG/5ML SUER Take 5 mLs by mouth 2 (two) times daily. 01/26/18   Earleen Newport, MD  cloNIDine (CATAPRES) 0.1 MG tablet Take 1 tablet (0.1 mg total) by mouth 2 (two) times daily. 11/08/17  Volney American, PA-C  colchicine 0.6 MG tablet Take 1 tablet (0.6 mg total) by mouth 2 (two) times daily. As needed for gout flares 04/19/17   Volney American, PA-C  diphenhydrAMINE (BENADRYL) 25 MG tablet Take 25 mg by mouth every 6 (six) hours as needed.    [provider]  furosemide (LASIX) 40 MG tablet Take 1 tablet (40 mg total) by mouth daily. 12/26/17 03/26/18  Alisa Graff, FNP  gabapentin (NEURONTIN) 300 MG capsule Take 1 capsule (300 mg total) by mouth at bedtime. 02/17/18   Volney American, PA-C  hydrALAZINE (APRESOLINE) 50 MG tablet TAKE  1 TABLET BY MOUTH THREE TIMES A DAY 11/24/17   Volney American, PA-C  levofloxacin (LEVAQUIN) 750 MG tablet Take 1 tablet (750 mg total) by mouth daily. 01/26/18   Earleen Newport, MD  lisinopril (PRINIVIL,ZESTRIL) 20 MG tablet Take 1 tablet (20 mg total) by mouth daily. 03/24/17   Alisa Graff, FNP  metoprolol (TOPROL XL) 200 MG 24 hr tablet Take 1 tablet (200 mg total) by mouth daily. 12/01/17   Alisa Graff, FNP  ondansetron (ZOFRAN ODT) 4 MG disintegrating tablet Take 1 tablet (4 mg total) by mouth every 8 (eight) hours as needed for nausea or vomiting. 01/26/18   Earleen Newport, MD  pantoprazole (PROTONIX) 40 MG tablet Take 1 tablet (40 mg total) by mouth daily. 11/08/17   Volney American, PA-C  potassium chloride SA (KLOR-CON M20) 20 MEQ tablet Take 1 tablet (20 mEq total) by mouth daily. Patient taking differently: Take 20 mEq by mouth 2 (two) times daily.  03/24/17   Alisa Graff, FNP  predniSONE (DELTASONE) 20 MG tablet Take 2 tablets (40 mg total) by mouth daily with breakfast. 02/17/18   Volney American, PA-C  sertraline (ZOLOFT) 100 MG tablet TAKE 1 TABLET BY MOUTH EVERY DAY 10/13/17   Volney American, PA-C  spironolactone (ALDACTONE) 25 MG tablet Take 2 tablets (50 mg total) by mouth daily. 04/07/17   Volney American, PA-C    Allergies  Allergen Reactions  . Morphine And Related Hives  . Penicillins Other (See Comments)    Painful urination Has patient had a PCN reaction causing immediate rash, facial/tongue/throat swelling, SOB or lightheadedness with hypotension: yes Has patient had a PCN reaction causing severe rash involving mucus membranes or skin necrosis: no Has patient had a PCN reaction that required hospitalization no Has patient had a PCN reaction occurring within the last 10 years: no If all of the above answers are "NO", then may proceed with Cephalosporin use.     Family History  Problem Relation Age of Onset  .  Prostate cancer Father   . Rectal cancer Sister   . Breast cancer Sister   . Cancer Maternal Grandmother   . Cancer Maternal Grandfather     Social History Social History   Tobacco Use  . Smoking status: Former Smoker    Types: Cigarettes    Last attempt to quit: 01/26/2016    Years since quitting: 2.1  . Smokeless tobacco: Never Used  Substance Use Topics  . Alcohol use: Not Currently    Comment: occ  . Drug use: No    Review of Systems Constitutional: Negative for fever. Cardiovascular: Acute onset of chest discomfort around 1 PM.  Described as moderate burning type pain in her chest. Respiratory: Positive for shortness of breath starting at 1 PM. Gastrointestinal: Negative for abdominal pain, vomiting Genitourinary: Negative for  urinary compaints Musculoskeletal: Negative for leg pain or swelling. Skin: Negative for skin complaints  Neurological: Negative for headache All other ROS negative  ____________________________________________   PHYSICAL EXAM:  VITAL SIGNS: ED Triage Vitals  Enc Vitals Group     BP 03/25/18 1512 139/88     Pulse Rate 03/25/18 1512 (!) 105     Resp 03/25/18 1512 (!) 23     Temp 03/25/18 1509 (!) 97.3 F (36.3 C)     Temp Source 03/25/18 1509 Oral     SpO2 03/25/18 1512 95 %     Weight 03/25/18 1509 242 lb (109.8 kg)     Height 03/25/18 1509 5\' 9"  (1.753 m)     Head Circumference --      Peak Flow --      Pain Score 03/25/18 1509 8     Pain Loc --      Pain Edu? --      Excl. in [place]? --    Constitutional: Alert and oriented. Well appearing and in no distress. Eyes: Normal exam ENT   Head: Normocephalic and atraumatic.   Mouth/Throat: Mucous membranes are moist. Cardiovascular: Regular rhythm, rate around 166 bpm.  2/6 systolic murmur. Respiratory: Normal respiratory effort without tachypnea nor retractions. Breath sounds are clear  Gastrointestinal: Soft and nontender. No distention.   Musculoskeletal: Nontender with  normal range of motion in all extremities.  Neurologic:  Normal speech and language. No gross focal neurologic deficits Skin:  Skin is warm Psychiatric: Mood and affect are normal.   ____________________________________________    EKG  EKG viewed and interpreted by myself shows sinus tachycardia at 103 bpm, narrow QRS, normal axis, largely normal intervals, nonspecific ST changes without ST elevation.  ____________________________________________    RADIOLOGY  ETA is negative for acute abnormality.  ____________________________________________   INITIAL IMPRESSION / ASSESSMENT AND PLAN / ED COURSE  Pertinent labs & imaging results that were available during my care of the patient were reviewed by me and considered in my medical decision making (see chart for details).  Patient presents to the emergency department for acute onset of chest pain shortness of breath and rapid heart rate began around 1 PM.  Differential this time would include pulmonary embolism, ACS, chest wall pain, pneumonia, pneumothorax.  Patient states her discomfort is down to a 3/10.  Denies any shortness of breath at this time.  She remains tachycardic around 110 bpm status post 2.5 mg of metoprolol by EMS.  Satting 95% on room air, respiration rate of 23.  History of DVT years ago in the right lower extremity although denies any pain or swelling currently.  No tenderness on exam.  No edema on exam.  We will obtain CT angiography of the chest to rule out PE.  We will check labs including cardiac enzymes and continue to closely monitor.  Patient CT angiography is negative for acute abnormality.  Patient states her chest pain and shortness of breath are completely gone at this time.  Patient has a heart rate in the mid 80s, satting 98% on room air.  Has not received any further medications since the metoprolol given by EMS.  Patient is having a moderate headache.  We will dose Toradol and Tylenol for her headache.  We  will repeat a troponin at 8 PM.  If the patient's repeat troponin is negative and the patient still wishes to be discharged home I believe this would be safe with cardiology follow-up.  Patient agreeable to plan of  care.  Unsure exactly what caused the patient's rapid heart rate based of the EMS rhythm strip possibly A. fib with RVR.  Patient now states return of chest pain.  We will admit to the hospitalist service for chest pain monitoring and work-up.  ____________________________________________   FINAL CLINICAL IMPRESSION(S) / ED DIAGNOSES  Chest pain Shortness of breath Tachycardia   Harvest Dark, MD 03/25/18 2010

## 2018-03-25 NOTE — ED Notes (Signed)
Admitting MD at bedside.

## 2018-03-26 ENCOUNTER — Inpatient Hospital Stay
Admit: 2018-03-26 | Discharge: 2018-03-26 | Disposition: A | Discharge status: Home / Self Care | Payer: Medicaid Other | Attending: Internal Medicine | Admitting: Internal Medicine

## 2018-03-26 LAB — HEPARIN LEVEL (UNFRACTIONATED)
Heparin Unfractionated: 0.42 IU/mL (ref 0.30–0.70)
Heparin Unfractionated: 0.32 IU/mL (ref 0.30–0.70)
Heparin Unfractionated: 0.31 IU/mL (ref 0.30–0.70)

## 2018-03-26 LAB — BASIC METABOLIC PANEL
Anion gap: 7 (ref 5–15)
BUN: 13 mg/dL (ref 6–20)
CO2: 25 mmol/L (ref 22–32)
Calcium: 8.4 mg/dL — ABNORMAL LOW (ref 8.9–10.3)
Chloride: 110 mmol/L (ref 98–111)
Creatinine, Ser: 0.76 mg/dL (ref 0.44–1.00)
GFR calc Af Amer: >60 mL/min (ref >60)
GFR calc non Af Amer: >60 mL/min (ref >60)
Glucose, Bld: 114 mg/dL — ABNORMAL HIGH (ref 70–99)
Potassium: 3.4 mmol/L — ABNORMAL LOW (ref 3.5–5.1)
SODIUM: 142 mmol/L (ref 135–145)

## 2018-03-26 LAB — TROPONIN I
Troponin I: 0.04 ng/mL (ref <0.03)
Troponin I: <0.03 ng/mL (ref <0.03)

## 2018-03-26 LAB — ECHOCARDIOGRAM COMPLETE
Height: 69 in
Weight: 3848 oz

## 2018-03-26 LAB — CBC
HCT: 38 % (ref 36.0–46.0)
Hemoglobin: 11.7 g/dL — ABNORMAL LOW (ref 12.0–15.0)
MCH: 27.8 pg (ref 26.0–34.0)
MCHC: 30.8 g/dL (ref 30.0–36.0)
MCV: 90.3 fL (ref 80.0–100.0)
NRBC: 0 % (ref 0.0–0.2)
PLATELETS: 267 10*3/uL (ref 150–400)
RBC: 4.21 MIL/uL (ref 3.87–5.11)
RDW: 15.1 % (ref 11.5–15.5)
WBC: 10.1 10*3/uL (ref 4.0–10.5)

## 2018-03-26 LAB — HEMOGLOBIN A1C
HEMOGLOBIN A1C: 6 % — AB (ref 4.8–5.6)
Mean Plasma Glucose: 125.5 mg/dL

## 2018-03-26 LAB — MAGNESIUM: Magnesium: 2 mg/dL (ref 1.7–2.4)

## 2018-03-26 MED ORDER — SODIUM CHLORIDE 0.9 % WEIGHT BASED INFUSION
3.0000 mL/kg/h | INTRAVENOUS | Status: DC
  Administered 2018-03-27: 3 mL/kg/h via INTRAVENOUS

## 2018-03-26 MED ORDER — SODIUM CHLORIDE 0.9 % WEIGHT BASED INFUSION: 1.0000 mL/kg/h | INTRAVENOUS | Status: DC

## 2018-03-26 MED ORDER — SODIUM CHLORIDE 0.9% FLUSH
3.0000 mL | Freq: Two times a day (BID) | INTRAVENOUS | Status: DC
  Administered 2018-03-27: 3 mL via INTRAVENOUS

## 2018-03-26 MED ORDER — ASPIRIN 81 MG PO CHEW
81.0000 mg | CHEWABLE_TABLET | ORAL | Status: AC
  Administered 2018-03-27: 81 mg via ORAL
  Filled 2018-03-26: qty 1

## 2018-03-26 MED ORDER — SODIUM CHLORIDE 0.9 % IV SOLN: 250.0000 mL | INTRAVENOUS | Status: DC | PRN

## 2018-03-26 MED ORDER — POTASSIUM CHLORIDE CRYS ER 20 MEQ PO TBCR
40.0000 meq | EXTENDED_RELEASE_TABLET | Freq: Once | ORAL | Status: AC
  Administered 2018-03-26: 40 meq via ORAL
  Filled 2018-03-26: qty 2

## 2018-03-26 MED ORDER — SODIUM CHLORIDE 0.9% FLUSH: 3.0000 mL | INTRAVENOUS | Status: DC | PRN

## 2018-03-26 MED ORDER — SODIUM CHLORIDE 0.9 % WEIGHT BASED INFUSION: 3.0000 mL/kg/h | INTRAVENOUS | Status: DC

## 2018-03-26 NOTE — Progress Notes (Signed)
Pharmacy Electrolyte Monitoring Consult:  Pharmacy consulted to assist in monitoring and replacing electrolytes in this 53 y.o. female admitted on 03/25/2018 with Tachycardia and Shortness of Breath   Labs:  Sodium (mmol/L)  Date Value  03/26/2018 142  11/08/2017 150 (H)  01/15/2014 139   Potassium (mmol/L)  Date Value  03/26/2018 3.4 (L)  01/15/2014 3.9   Magnesium (mg/dL)  Date Value  03/26/2018 2.0  01/14/2014 1.8   Calcium (mg/dL)  Date Value  03/26/2018 8.4 (L)   Calcium, Total (mg/dL)  Date Value  01/15/2014 8.6   Albumin (g/dL)  Date Value  01/26/2018 3.7  11/08/2017 4.0  01/14/2014 3.2 (L)    Assessment/Plan: Patient received potassium 31mEq PO x 2 on 3/1. Will order additional potassium 26mEq PO x 1 and recheck BMP with am labs.   Pharmacy will continue to monitor and adjust per consult.   Zarin Hagmann L 03/26/2018 8:06 AM

## 2018-03-26 NOTE — Consult Note (Signed)
[facility] Cardiology  CARDIOLOGY CONSULT NOTE  Patient ID: [name] MRN: [id number] DOB/AGE: 1965-02-27 53 y.o.  Admit date: 03/25/2018 Referring [place] Primary Physician [place] Primary Cardiologist  Reason for Consultation unstable angina  HPI: 53 year old female referred for evaluation of chest pain.  The patient has a history of COPD, chronic diastolic congestive heart failure, and history of prior pulmonary embolus.  He presents with acute onset of chest pain, left arm weakness, and shortness of breath.  In route, the patient apparently was tachycardic.  An ECG revealed sinus tachycardia at a rate of 103 bpm.  Labs notable for borderline elevated troponin of 0.4.  Chest CT was negative for pulmonary embolus.  By her 2D echocardiogram 06/17/2016 revealed normal left ventricular function, with LVEF 55 to 60%.  Patient reports feeling much better this morning, currently denies chest pain.  She is on a heparin drip.  Review of systems complete and found to be negative unless listed above     Past Medical History:  Diagnosis Date  . A-fib ([place]) 2010  . Adrenal adenoma, left   . Alcohol abuse   . Allergy   . CHF (congestive heart failure) ([place])   . COPD (chronic obstructive pulmonary disease) ([place])   . Fatty liver   . GERD (gastroesophageal reflux disease)   . Gout   . Hypertension   . Mild cognitive impairment   . Non-healing non-surgical wound    right leg  . Obesity   . Pulmonary embolus ([place])   . Sleep apnea    uses Cpap  . Stroke ([place])   . Thyroid disease     Past Surgical History:  Procedure Laterality Date  . bilateral wrist fractures  2010  . lt ankle fracture  2002    Medications Prior to Admission  Medication Sig Dispense Refill Last Dose  . albuterol (PROVENTIL HFA;VENTOLIN HFA) 108 (90 Base) MCG/ACT inhaler Inhale 2 puffs into the lungs every 6 (six) hours as needed for wheezing or shortness of breath.   prn at prn  . allopurinol (ZYLOPRIM) 100 MG  tablet Take 1 tablet (100 mg total) by mouth daily. 30 tablet 6 03/25/2018 at 0600  . amLODipine (NORVASC) 10 MG tablet Take 1 tablet (10 mg total) by mouth daily. 30 tablet 1 03/25/2018 at 0600  . aspirin EC 81 MG tablet Take 81 mg by mouth daily.   03/25/2018 at 0600  . atorvastatin (LIPITOR) 40 MG tablet Take 1 tablet (40 mg total) by mouth daily. 90 tablet 1 03/25/2018 at 0600  . butalbital-acetaminophen-caffeine (FIORICET, ESGIC) 50-325-40 MG tablet Take 1-2 tablets by mouth every 6 (six) hours as needed for headache. 20 tablet 0 prn at prn  . cloNIDine (CATAPRES) 0.1 MG tablet Take 1 tablet (0.1 mg total) by mouth 2 (two) times daily. 60 tablet 6 03/25/2018 at 0600  . colchicine 0.6 MG tablet Take 1 tablet (0.6 mg total) by mouth 2 (two) times daily. As needed for gout flares 30 tablet 1 prn at prn  . furosemide (LASIX) 40 MG tablet Take 1 tablet (40 mg total) by mouth daily. 90 tablet 3 03/25/2018 at 0600  . gabapentin (NEURONTIN) 300 MG capsule Take 1 capsule (300 mg total) by mouth at bedtime. 30 capsule 1 03/24/2018 at 2000  . hydrALAZINE (APRESOLINE) 50 MG tablet TAKE 1 TABLET BY MOUTH THREE TIMES A DAY 90 tablet 2 03/25/2018 at 0600  . lisinopril (PRINIVIL,ZESTRIL) 20 MG tablet Take 1 tablet (20 mg total) by mouth daily. [place]  tablet 5 03/25/2018 at 0600  . metoprolol (TOPROL XL) 200 MG 24 hr tablet Take 1 tablet (200 mg total) by mouth daily. 90 tablet 3 03/25/2018 at 0600  . pantoprazole (PROTONIX) 40 MG tablet Take 1 tablet (40 mg total) by mouth daily. 30 tablet 3 03/25/2018 at 0600  . potassium chloride SA (KLOR-CON M20) 20 MEQ tablet Take 1 tablet (20 mEq total) by mouth daily. (Patient taking differently: Take 20 mEq by mouth 2 (two) times daily. ) 30 tablet 5 03/25/2018 at 0600  . predniSONE (DELTASONE) 20 MG tablet Take 2 tablets (40 mg total) by mouth daily with breakfast. 10 tablet 0 03/25/2018 at 0600   Social History   Socioeconomic History  . Marital status: Legally Separated    Spouse  name: Not on file  . Number of children: Not on file  . Years of education: Not on file  . Highest education level: Not on file  Occupational History  . Not on file  Social Needs  . Financial resource strain: Not on file  . Food insecurity:    Worry: Not on file    Inability: Not on file  . Transportation needs:    Medical: Not on file    Non-medical: Not on file  Tobacco Use  . Smoking status: Former Smoker    Types: Cigarettes    Last attempt to quit: 01/26/2016    Years since quitting: 2.1  . Smokeless tobacco: Never Used  Substance and Sexual Activity  . Alcohol use: Not Currently    Comment: occ  . Drug use: No  . Sexual activity: Never  Lifestyle  . Physical activity:    Days per week: Not on file    Minutes per session: Not on file  . Stress: Not on file  Relationships  . Social connections:    Talks on phone: Not on file    Gets together: Not on file    Attends religious service: Not on file    Active member of club or organization: Not on file    Attends meetings of clubs or organizations: Not on file    Relationship status: Not on file  . Intimate partner violence:    Fear of current or ex partner: Not on file    Emotionally abused: Not on file    Physically abused: Not on file    Forced sexual activity: Not on file  Other Topics Concern  . Not on file  Social History Narrative  . Not on file    Family History  Problem Relation Age of Onset  . Prostate cancer Father   . Rectal cancer Sister   . Breast cancer Sister   . Cancer Maternal Grandmother   . Cancer Maternal Grandfather       Review of systems complete and found to be negative unless listed above      PHYSICAL EXAM  General: Well developed, well nourished, in no acute distress HEENT:  Normocephalic and atramatic Neck:  No JVD.  Lungs: Clear bilaterally to auscultation and percussion. Heart: HRRR . Normal S1 and S2 without gallops or murmurs.  Abdomen: Bowel sounds are positive,  abdomen soft and non-tender  Msk:  Back normal, normal gait. Normal strength and tone for age. Extremities: No clubbing, cyanosis or edema.   Neuro: Alert and oriented X 3. Psych:  Good affect, responds appropriately  Labs:   Lab Results  Component Value Date   WBC 10.1 03/26/2018   HGB 11.7 (L) 03/26/2018  HCT 38.0 03/26/2018   MCV 90.3 03/26/2018   PLT 267 03/26/2018    Recent Labs  Lab 03/26/18 0259  NA 142  K 3.4*  CL 110  CO2 25  BUN 13  CREATININE 0.76  CALCIUM 8.4*  GLUCOSE 114*   Lab Results  Component Value Date   CKTOTAL 29 08/20/2013   CKMB 0.9 01/14/2014   TROPONINI 0.04 (HH) 03/26/2018    Lab Results  Component Value Date   CHOL 192 03/25/2018   CHOL 208 (H) 11/08/2017   CHOL 202 (H) 05/09/2017   Lab Results  Component Value Date   HDL 61 03/25/2018   HDL 74 11/08/2017   HDL 40 05/09/2017   Lab Results  Component Value Date   LDLCALC 111 (H) 03/25/2018   LDLCALC 110 (H) 11/08/2017   LDLCALC 140 (H) 05/09/2017   Lab Results  Component Value Date   TRIG 100 03/25/2018   TRIG 122 11/08/2017   TRIG 111 05/09/2017   Lab Results  Component Value Date   CHOLHDL 3.1 03/25/2018   No results found for: LDLDIRECT    Radiology: Dg Chest 2 View  Result Date: 03/25/2018 CLINICAL DATA:  Pt comes from home via EMS after being called out for breathing issues. Pt was tachy with ems upon arrival with initial rate 150s-170s. Pt has had 324mg  aspirin and 531ml bolus and 2.5mg  of metoprolol which brought her rate down. Fell last week. RIGHT LOWER breast/chest pain. History of alcohol abuse, smoking. EXAM: CHEST - 2 VIEW COMPARISON:  01/26/2018 FINDINGS: Heart is enlarged. There is tortuosity of the thoracic aorta. There is mild prominence of interstitial markings. LEFT UPPER lobe opacity is consistent with scarring. Numerous remote bilateral rib fractures. No pulmonary edema. Further wedge compression fracture of T8. IMPRESSION: 1. Cardiomegaly. 2. LEFT UPPER  lobe scarring. 3. Further wedge compression fracture of T8. Electronically Signed   By: Nolon Nations M.D.   On: 03/25/2018 16:00   Ct Angio Chest Pe W And/or Wo Contrast  Result Date: 03/25/2018 CLINICAL DATA:  Chest pain and difficulty breathing today. EXAM: CT ANGIOGRAPHY CHEST WITH CONTRAST TECHNIQUE: Multidetector CT imaging of the chest was performed using the standard protocol during bolus administration of intravenous contrast. Multiplanar CT image reconstructions and MIPs were obtained to evaluate the vascular anatomy. CONTRAST:  30mL OMNIPAQUE IOHEXOL 350 MG/ML SOLN COMPARISON:  Chest CT 09/12/2012 FINDINGS: Cardiovascular: The heart is mildly enlarged. There is tortuosity and ectasia of the thoracic aorta. Scattered atherosclerotic calcifications. No dissection. Branch vessels are patent but are also very tortuous. No obvious coronary artery calcifications. The pulmonary arterial tree is fairly well opacified. No filling defects to suggest pulmonary embolism. Mediastinum/Nodes: No mediastinal or hilar mass or adenopathy. Small scattered lymph nodes are noted. The esophagus is grossly normal. Lungs/Pleura: No acute pulmonary findings. No infiltrates, edema or effusions. Chronic scarring changes and loss of volume in the left hemithorax and chronic areas of bilateral pleural thickening. Upper Abdomen: No significant upper abdominal findings. Stable left adrenal gland nodule consistent with a benign adenoma. Musculoskeletal: Evidence of significant remote thoracic trauma with numerous bilateral healed rib fractures and healed clavicle fracture with massive callus formation. Review of the MIP images confirms the above findings. IMPRESSION: 1. No CT findings for acute pulmonary embolism. 2. Tortuosity and ectasia of the thoracic aorta but no focal aneurysm or dissection. 3. No mediastinal or hilar mass or adenopathy. 4. No acute pulmonary findings.  Chronic pulmonary scarring changes. 5. Evidence of  severe remote trauma  to the bony thorax. 6. Stable left adrenal gland adenoma. Aortic Atherosclerosis (ICD10-I70.0). Electronically Signed   By: Marijo Sanes M.D.   On: 03/25/2018 17:30    EKG: Sinus tachycardia with nonspecific ST-T wave abnormalities.  ASSESSMENT AND PLAN:   1.  Acute onset of chest pain, nondiagnostic ECG, borderline elevated troponin 2.  Chronic diastolic congestive heart failure 3.  COPD  Recommendations  1.  Agree with current therapy 2.  Continue heparin drip 3.  Proceed with cardiac catheterization with selective coronary arteriography in the a.m. via right radial artery approach.  The risks, benefits and alternatives of cardiac catheterization and possible PCI were explained to the patient and informed written consent was obtained.  Signed: Isaias Cowman MD,PhD, [facility] 03/26/2018, 9:00 AM

## 2018-03-26 NOTE — Progress Notes (Signed)
ANTICOAGULATION CONSULT NOTE  Pharmacy Consult for Heparin Drip Management  Indication: chest pain/ACS  Allergies  Allergen Reactions  . Morphine And Related Hives  . Penicillins Other (See Comments)    Painful urination Has patient had a PCN reaction causing immediate rash, facial/tongue/throat swelling, SOB or lightheadedness with hypotension: yes Has patient had a PCN reaction causing severe rash involving mucus membranes or skin necrosis: no Has patient had a PCN reaction that required hospitalization no Has patient had a PCN reaction occurring within the last 10 years: no If all of the above answers are "NO", then may proceed with Cephalosporin use.     Patient Measurements: Height: 5\' 9"  (175.3 cm) Weight: 240 lb 1.6 oz (108.9 kg) IBW/kg (Calculated) : 66.2 Heparin Dosing Weight: 90.9kg  Vital Signs: Temp: 98.1 F (36.7 C) (03/01 1623) Temp Source: Oral (03/01 1623) BP: 136/98 (03/01 1623) Pulse Rate: 73 (03/01 1623)  Labs: Recent Labs    03/25/18 1520  03/25/18 1724 03/25/18 2007 03/25/18 2113 03/26/18 0259 03/26/18 0824 03/26/18 1118 03/26/18 1659  HGB 13.6  --   --   --   --  11.7*  --   --   --   HCT 42.9  --   --   --   --  38.0  --   --   --   PLT 295  --   --   --   --  267  --   --   --   APTT  --   --   --   --  24  --   --   --   --   LABPROT  --   --   --   --  11.9  --   --   --   --   INR  --   --   --   --  0.9  --   --   --   --   HEPARINUNFRC  --   --   --   --   --  0.42  --  0.32 0.31  CREATININE  --   --  0.65  --   --  0.76  --   --   --   TROPONINI  --    < > 0.04* 0.03*  --  0.04* <0.03  --   --    < > = values in this interval not displayed.    Estimated Creatinine Clearance: 108.2 mL/min (by C-G formula based on SCr of 0.76 mg/dL).   Medications:  Scheduled:  . allopurinol  100 mg Oral Daily  . amLODipine  10 mg Oral Daily  . [START ON 03/27/2018] aspirin  81 mg Oral Pre-Cath  . aspirin EC  81 mg Oral Daily  . atorvastatin  40  mg Oral Daily  . cloNIDine  0.1 mg Oral BID  . colchicine  0.6 mg Oral BID  . furosemide  40 mg Oral Daily  . gabapentin  300 mg Oral QHS  . hydrALAZINE  50 mg Oral TID  . lisinopril  20 mg Oral Daily  . metoprolol  200 mg Oral Daily  . pantoprazole  40 mg Oral Daily  . predniSONE  40 mg Oral Q breakfast  . sodium chloride flush  3 mL Intravenous Q12H  . sodium chloride flush  3 mL Intravenous Q12H   Infusions:  . sodium chloride    . sodium chloride    . [START ON 03/27/2018] sodium chloride  Followed by  . [START ON 03/27/2018] sodium chloride    . heparin 1,200 Units/hr (03/26/18 1519)    Assessment: Pharmacy consulted for heparin drip management for 53 yo female initiated on heparin for possible ACS. Patient has past medical history significant for CHF, hypertension, PE/DVT, ischemic stroke, dyslipidemia, COPD and possible atrial fibrillation. Patient is not currently on oral anticoagulation as an outpatient. Heparin is currently infusing at 1200 units/hr.   Goal of Therapy:  Heparin level 0.3-0.7 units/ml Monitor platelets by anticoagulation protocol: Yes   Plan:  Will continue heparin at 1200 units/hr. Heparin level remains low, but continues in range. Will obtain follow up anti-Xa level with am labs..    Pharmacy will continue to monitor and adjust per consult.   Rayley Gao L 03/26/2018,7:38 PM

## 2018-03-26 NOTE — Progress Notes (Signed)
ANTICOAGULATION CONSULT NOTE  Pharmacy Consult for Heparin Drip Management  Indication: chest pain/ACS  Allergies  Allergen Reactions  . Morphine And Related Hives  . Penicillins Other (See Comments)    Painful urination Has patient had a PCN reaction causing immediate rash, facial/tongue/throat swelling, SOB or lightheadedness with hypotension: yes Has patient had a PCN reaction causing severe rash involving mucus membranes or skin necrosis: no Has patient had a PCN reaction that required hospitalization no Has patient had a PCN reaction occurring within the last 10 years: no If all of the above answers are "NO", then may proceed with Cephalosporin use.     Patient Measurements: Height: 5\' 9"  (175.3 cm) Weight: 240 lb 8 oz (109.1 kg) IBW/kg (Calculated) : 66.2 Heparin Dosing Weight: 90.9kg  Vital Signs: Temp: 98 F (36.7 C) (03/01 0410) Temp Source: Oral (02/29 2306) BP: 103/59 (03/01 0410) Pulse Rate: 85 (03/01 0410)  Labs: Recent Labs    03/25/18 1520 03/25/18 1724 03/25/18 2007 03/25/18 2113 03/26/18 0259  HGB 13.6  --   --   --  11.7*  HCT 42.9  --   --   --  38.0  PLT 295  --   --   --  267  APTT  --   --   --  24  --   LABPROT  --   --   --  11.9  --   INR  --   --   --  0.9  --   HEPARINUNFRC  --   --   --   --  0.42  CREATININE  --  0.65  --   --  0.76  TROPONINI  --  0.04* 0.03*  --  0.04*    Estimated Creatinine Clearance: 108.3 mL/min (by C-G formula based on SCr of 0.76 mg/dL).   Medications:  Scheduled:  . allopurinol  100 mg Oral Daily  . amLODipine  10 mg Oral Daily  . aspirin EC  81 mg Oral Daily  . atorvastatin  40 mg Oral Daily  . cloNIDine  0.1 mg Oral BID  . colchicine  0.6 mg Oral BID  . furosemide  40 mg Oral Daily  . gabapentin  300 mg Oral QHS  . hydrALAZINE  50 mg Oral TID  . lisinopril  20 mg Oral Daily  . metoprolol  200 mg Oral Daily  . pantoprazole  40 mg Oral Daily  . potassium chloride  40 mEq Oral Once  . predniSONE   40 mg Oral Q breakfast  . sodium chloride flush  3 mL Intravenous Q12H   Infusions:  . sodium chloride    . heparin 1,200 Units/hr (03/25/18 2120)    Assessment: Pharmacy consulted for heparin drip management for 53 yo female initiated on heparin for possible ACS. Patient has past medical history significant for CHF, hypertension, PE/DVT, ischemic stroke, dyslipidemia, COPD and possible atrial fibrillation. Patient is not currently on oral anticoagulation as an outpatient. Heparin is currently infusing at 1200 units/hr.   Goal of Therapy:  Heparin level 0.3-0.7 units/ml Monitor platelets by anticoagulation protocol: Yes   Plan:  Will continue heparin at 1200 units/hr. Will obtain confirmatory heparin level at 1100.   Pharmacy will continue to monitor and adjust per consult.   Lelar Farewell L 03/26/2018,8:08 AM

## 2018-03-26 NOTE — Progress Notes (Signed)
[facility] NAME: [name]    MR#:  [id number]  DATE OF BIRTH:  09-13-1965  SUBJECTIVE: Patient admitted for unstable angina with chest pain.  No chest pain today morning or shortness of breath.  On heparin drip.  Peak troponin is 0.04.  CHIEF COMPLAINT:   Chief Complaint  Patient presents with  . Tachycardia  . Shortness of Breath    REVIEW OF SYSTEMS:    Review of Systems  Constitutional: Negative for chills and fever.  HENT: Negative for hearing loss.   Eyes: Negative for blurred vision, double vision and photophobia.  Respiratory: Negative for cough, hemoptysis and shortness of breath.   Cardiovascular: Negative for palpitations, orthopnea and leg swelling.  Gastrointestinal: Negative for abdominal pain, diarrhea and vomiting.  Genitourinary: Negative for dysuria and urgency.  Musculoskeletal: Negative for myalgias and neck pain.  Skin: Negative for rash.  Neurological: Negative for dizziness, focal weakness, seizures, weakness and headaches.  Psychiatric/Behavioral: Negative for memory loss. The patient does not have insomnia.     Nutrition:  Tolerating Diet: Tolerating PT:      DRUG ALLERGIES:   Allergies  Allergen Reactions  . Morphine And Related Hives  . Penicillins Other (See Comments)    Painful urination Has patient had a PCN reaction causing immediate rash, facial/tongue/throat swelling, SOB or lightheadedness with hypotension: yes Has patient had a PCN reaction causing severe rash involving mucus membranes or skin necrosis: no Has patient had a PCN reaction that required hospitalization no Has patient had a PCN reaction occurring within the last 10 years: no If all of the above answers are "NO", then may proceed with Cephalosporin use.     VITALS:  Blood pressure 119/84, pulse 76, temperature 97.8 F (36.6 C), temperature source Oral, resp. rate (!) 22, height 5\' 9"  (1.753 m), weight  109.1 kg, SpO2 96 %.  PHYSICAL EXAMINATION:   Physical Exam  GENERAL:  53 y.o.-year-old patient lying in the bed with no acute distress.  EYES: Pupils equal, round, reactive to light and accommodation. No scleral icterus. Extraocular muscles intact.  HEENT: Head atraumatic, normocephalic. Oropharynx and nasopharynx clear.  NECK:  Supple, no jugular venous distention. No thyroid enlargement, no tenderness.  LUNGS: Normal breath sounds bilaterally, no wheezing, rales,rhonchi or crepitation. No use of accessory muscles of respiration.  CARDIOVASCULAR: S1, S2 normal. No murmurs, rubs, or gallops.  ABDOMEN: Soft, nontender, nondistended. Bowel sounds present. No organomegaly or mass.  EXTREMITIES: No pedal edema, cyanosis, or clubbing.  NEUROLOGIC: Cranial nerves II through XII are intact. Muscle strength 5/5 in all extremities. Sensation intact. Gait not checked.  PSYCHIATRIC: The patient is alert and oriented x 3.  SKIN: No obvious rash, lesion, or ulcer.    LABORATORY PANEL:   CBC Recent Labs  Lab 03/26/18 0259  WBC 10.1  HGB 11.7*  HCT 38.0  PLT 267   ------------------------------------------------------------------------------------------------------------------  Chemistries  Recent Labs  Lab 03/26/18 0259  NA 142  K 3.4*  CL 110  CO2 25  GLUCOSE 114*  BUN 13  CREATININE 0.76  CALCIUM 8.4*  MG 2.0   ------------------------------------------------------------------------------------------------------------------  Cardiac Enzymes Recent Labs  Lab 03/26/18 0824  TROPONINI <0.03   ------------------------------------------------------------------------------------------------------------------  RADIOLOGY:  Dg Chest 2 View  Result Date: 03/25/2018 CLINICAL DATA:  Pt comes from home via EMS after being called out for breathing issues. Pt was tachy with ems upon arrival with initial rate 150s-170s. Pt has had 324mg  aspirin and  516ml bolus and 2.5mg  of metoprolol  which brought her rate down. Fell last week. RIGHT LOWER breast/chest pain. History of alcohol abuse, smoking. EXAM: CHEST - 2 VIEW COMPARISON:  01/26/2018 FINDINGS: Heart is enlarged. There is tortuosity of the thoracic aorta. There is mild prominence of interstitial markings. LEFT UPPER lobe opacity is consistent with scarring. Numerous remote bilateral rib fractures. No pulmonary edema. Further wedge compression fracture of T8. IMPRESSION: 1. Cardiomegaly. 2. LEFT UPPER lobe scarring. 3. Further wedge compression fracture of T8. Electronically Signed   By: Nolon Nations M.D.   On: 03/25/2018 16:00   Ct Angio Chest Pe W And/or Wo Contrast  Result Date: 03/25/2018 CLINICAL DATA:  Chest pain and difficulty breathing today. EXAM: CT ANGIOGRAPHY CHEST WITH CONTRAST TECHNIQUE: Multidetector CT imaging of the chest was performed using the standard protocol during bolus administration of intravenous contrast. Multiplanar CT image reconstructions and MIPs were obtained to evaluate the vascular anatomy. CONTRAST:  35mL OMNIPAQUE IOHEXOL 350 MG/ML SOLN COMPARISON:  Chest CT 09/12/2012 FINDINGS: Cardiovascular: The heart is mildly enlarged. There is tortuosity and ectasia of the thoracic aorta. Scattered atherosclerotic calcifications. No dissection. Branch vessels are patent but are also very tortuous. No obvious coronary artery calcifications. The pulmonary arterial tree is fairly well opacified. No filling defects to suggest pulmonary embolism. Mediastinum/Nodes: No mediastinal or hilar mass or adenopathy. Small scattered lymph nodes are noted. The esophagus is grossly normal. Lungs/Pleura: No acute pulmonary findings. No infiltrates, edema or effusions. Chronic scarring changes and loss of volume in the left hemithorax and chronic areas of bilateral pleural thickening. Upper Abdomen: No significant upper abdominal findings. Stable left adrenal gland nodule consistent with a benign adenoma. Musculoskeletal:  Evidence of significant remote thoracic trauma with numerous bilateral healed rib fractures and healed clavicle fracture with massive callus formation. Review of the MIP images confirms the above findings. IMPRESSION: 1. No CT findings for acute pulmonary embolism. 2. Tortuosity and ectasia of the thoracic aorta but no focal aneurysm or dissection. 3. No mediastinal or hilar mass or adenopathy. 4. No acute pulmonary findings.  Chronic pulmonary scarring changes. 5. Evidence of severe remote trauma to the bony thorax. 6. Stable left adrenal gland adenoma. Aortic Atherosclerosis (ICD10-I70.0). Electronically Signed   By: Marijo Sanes M.D.   On: 03/25/2018 17:30     ASSESSMENT AND PLAN:   Active Problems:   Unstable angina (HCC)   Acute left-sided chest pain with borderline elevation of troponins, EKG nondiagnostic, admitted to telemetry, started on heparin drip because of chest pain concerning for unstable angina due to her chest pain relieved with nitroglycerin.  Seen by cardiology, recommends continuing heparin drip, proceed with cardiac cath tomorrow morning.  Continue aspirin, beta-blockers, high intensity statins, patient LDL is 111,   #2 history of COPD: No wheezing. 3.  History of obstructive sleep apnea, use CPAP at night. 4.  Essential hypertension: Controlled.,  Continue hydralazine, clonidine, Norvasc, lisinopril, metoprolol. 5.  History of heart failure with preserved ejection fraction, EF 50%. #6 history of DVT, PE, history of for ischemic stroke in 2010.  Presently patient is on heparin drip.  CT chest is negative for PE. 7.  Hypokalemia: Replaced.,  Patient also had hypomagnesemia, replaced.  All the records are reviewed and case discussed with Care Management/Social Workerr. Management plans discussed with the patient, family and they are in agreement.  CODE STATUS: Full code  TOTAL TIME TAKING CARE OF THIS PATIENT: 38 minutes.   POSSIBLE D/C IN 1-2 DAYS, DEPENDING ON  CLINICAL CONDITION. More than 50% time spent in counseling, coordination of care  Epifanio Lesches M.D on 03/26/2018 at 11:51 AM  Between 7am to 6pm - Pager - [phone]  After 6pm go to [email or web address] - password [email or web address]  Office  [phone]  CC: Primary care physician; Volney American, PA-C

## 2018-03-26 NOTE — Progress Notes (Signed)
F/u visit pt was alert and stable. Pt shared that she was interested in an AD. Ch did education of AD with pt and sister on speaker phone. Pt will be having a procedure tomorrow so ch shared that she should wait until the sister is present and when she has recovered form being on anesthesia. Pt and sister agree. Pt was grateful of ch visit.    03/26/18 2100  Clinical Encounter Type  Visited With Patient and family together (spoke w/ family via phone )  Visit Type Psychological support;Spiritual support;Social support (AD education )  Referral From Physician  Consult/Referral To Chaplain  Spiritual Encounters  Spiritual Needs Emotional;Grief support  Stress Factors  Patient Stress Factors Major life changes  Family Stress Factors None identified  Advance Directives (For Healthcare)  Does Patient Have a Medical Advance Directive? No  Would patient like information on creating a medical advance directive? Yes (Inpatient - patient defers creating a medical advance directive at this time) (defer until post-op recovery for AD completion )

## 2018-03-26 NOTE — Progress Notes (Signed)
ANTICOAGULATION CONSULT NOTE  Pharmacy Consult for Heparin Drip Management  Indication: chest pain/ACS  Allergies  Allergen Reactions  . Morphine And Related Hives  . Penicillins Other (See Comments)    Painful urination Has patient had a PCN reaction causing immediate rash, facial/tongue/throat swelling, SOB or lightheadedness with hypotension: yes Has patient had a PCN reaction causing severe rash involving mucus membranes or skin necrosis: no Has patient had a PCN reaction that required hospitalization no Has patient had a PCN reaction occurring within the last 10 years: no If all of the above answers are "NO", then may proceed with Cephalosporin use.     Patient Measurements: Height: 5\' 9"  (175.3 cm) Weight: 240 lb 8 oz (109.1 kg) IBW/kg (Calculated) : 66.2 Heparin Dosing Weight: 90.9kg  Vital Signs: Temp: 97.8 F (36.6 C) (03/01 0847) Temp Source: Oral (03/01 0847) BP: 119/84 (03/01 0847) Pulse Rate: 76 (03/01 0847)  Labs: Recent Labs    03/25/18 1520  03/25/18 1724 03/25/18 2007 03/25/18 2113 03/26/18 0259 03/26/18 0824 03/26/18 1118  HGB 13.6  --   --   --   --  11.7*  --   --   HCT 42.9  --   --   --   --  38.0  --   --   PLT 295  --   --   --   --  267  --   --   APTT  --   --   --   --  24  --   --   --   LABPROT  --   --   --   --  11.9  --   --   --   INR  --   --   --   --  0.9  --   --   --   HEPARINUNFRC  --   --   --   --   --  0.42  --  0.32  CREATININE  --   --  0.65  --   --  0.76  --   --   TROPONINI  --    < > 0.04* 0.03*  --  0.04* <0.03  --    < > = values in this interval not displayed.    Estimated Creatinine Clearance: 108.3 mL/min (by C-G formula based on SCr of 0.76 mg/dL).   Medications:  Scheduled:  . allopurinol  100 mg Oral Daily  . amLODipine  10 mg Oral Daily  . aspirin EC  81 mg Oral Daily  . atorvastatin  40 mg Oral Daily  . cloNIDine  0.1 mg Oral BID  . colchicine  0.6 mg Oral BID  . furosemide  40 mg Oral Daily  .  gabapentin  300 mg Oral QHS  . hydrALAZINE  50 mg Oral TID  . lisinopril  20 mg Oral Daily  . metoprolol  200 mg Oral Daily  . pantoprazole  40 mg Oral Daily  . predniSONE  40 mg Oral Q breakfast  . sodium chloride flush  3 mL Intravenous Q12H   Infusions:  . sodium chloride    . heparin 1,200 Units/hr (03/25/18 2120)    Assessment: Pharmacy consulted for heparin drip management for 53 yo female initiated on heparin for possible ACS. Patient has past medical history significant for CHF, hypertension, PE/DVT, ischemic stroke, dyslipidemia, COPD and possible atrial fibrillation. Patient is not currently on oral anticoagulation as an outpatient. Heparin is currently infusing at 1200 units/hr.  Goal of Therapy:  Heparin level 0.3-0.7 units/ml Monitor platelets by anticoagulation protocol: Yes   Plan:  Will continue heparin at 1200 units/hr. Anti-Xa level is trending down. Will obtain level at 1700.   Pharmacy will continue to monitor and adjust per consult.   , L 03/26/2018,1:51 PM

## 2018-03-26 NOTE — Progress Notes (Signed)
Ch visited pt to complete OR for AD. Pt was resting at the time. Pt also had a CPAP on but seemed to be stable.     03/26/18 1350  Clinical Encounter Type  Visited With Patient  Visit Type Psychological support;Spiritual support;Social support;Other (Comment) (AD education )  Referral From Chaplain  Consult/Referral To Chaplain  Spiritual Encounters  Spiritual Needs Emotional;Grief support  Stress Factors  Patient Stress Factors None identified  Family Stress Factors None identified

## 2018-03-27 ENCOUNTER — Encounter: Payer: Self-pay | Admitting: Cardiology

## 2018-03-27 ENCOUNTER — Ambulatory Visit: Payer: Medicaid Other | Admitting: Family

## 2018-03-27 ENCOUNTER — Encounter
Admission: EM | Disposition: A | Discharge status: Home / Self Care | Payer: Self-pay | Source: Home / Self Care | Attending: Internal Medicine

## 2018-03-27 HISTORY — PX: LEFT HEART CATH AND CORONARY ANGIOGRAPHY: CATH118249

## 2018-03-27 LAB — BASIC METABOLIC PANEL
ANION GAP: 9 (ref 5–15)
BUN: 17 mg/dL (ref 6–20)
CO2: 22 mmol/L (ref 22–32)
Calcium: 8.7 mg/dL — ABNORMAL LOW (ref 8.9–10.3)
Chloride: 110 mmol/L (ref 98–111)
Creatinine, Ser: 0.6 mg/dL (ref 0.44–1.00)
GFR calc non Af Amer: >60 mL/min (ref >60)
Glucose, Bld: 127 mg/dL — ABNORMAL HIGH (ref 70–99)
Potassium: 3.7 mmol/L (ref 3.5–5.1)
Sodium: 141 mmol/L (ref 135–145)

## 2018-03-27 LAB — POCT I-STAT CREATININE: Creatinine, Ser: 0.6 mg/dL (ref 0.44–1.00)

## 2018-03-27 LAB — CBC
HCT: 39.6 % (ref 36.0–46.0)
Hemoglobin: 11.5 g/dL — ABNORMAL LOW (ref 12.0–15.0)
MCH: 27.3 pg (ref 26.0–34.0)
MCHC: 29 g/dL — ABNORMAL LOW (ref 30.0–36.0)
MCV: 94.1 fL (ref 80.0–100.0)
NRBC: 0 % (ref 0.0–0.2)
Platelets: 251 10*3/uL (ref 150–400)
RBC: 4.21 MIL/uL (ref 3.87–5.11)
RDW: 15 % (ref 11.5–15.5)
WBC: 9 10*3/uL (ref 4.0–10.5)

## 2018-03-27 LAB — HEPARIN LEVEL (UNFRACTIONATED): Heparin Unfractionated: 0.52 IU/mL (ref 0.30–0.70)

## 2018-03-27 SURGERY — LEFT HEART CATH AND CORONARY ANGIOGRAPHY
Anesthesia: Moderate Sedation

## 2018-03-27 MED ORDER — SODIUM CHLORIDE 0.9 % IV SOLN: 250.0000 mL | INTRAVENOUS | Status: DC | PRN

## 2018-03-27 MED ORDER — FENTANYL CITRATE (PF) 100 MCG/2ML IJ SOLN
INTRAMUSCULAR | Status: DC | PRN
  Administered 2018-03-27: 25 ug via INTRAVENOUS

## 2018-03-27 MED ORDER — FENTANYL CITRATE (PF) 100 MCG/2ML IJ SOLN
INTRAMUSCULAR | Status: AC
  Filled 2018-03-27: qty 2

## 2018-03-27 MED ORDER — POTASSIUM CHLORIDE CRYS ER 20 MEQ PO TBCR
20.0000 meq | EXTENDED_RELEASE_TABLET | Freq: Once | ORAL | Status: AC
  Administered 2018-03-27: 20 meq via ORAL
  Filled 2018-03-27: qty 1

## 2018-03-27 MED ORDER — ACETAMINOPHEN 325 MG PO TABS: 650.0000 mg | ORAL_TABLET | ORAL | Status: DC | PRN

## 2018-03-27 MED ORDER — SODIUM CHLORIDE 0.9% FLUSH: 3.0000 mL | INTRAVENOUS | Status: DC | PRN

## 2018-03-27 MED ORDER — SODIUM CHLORIDE 0.9% FLUSH: 3.0000 mL | Freq: Two times a day (BID) | INTRAVENOUS | Status: DC

## 2018-03-27 MED ORDER — IOPAMIDOL (ISOVUE-300) INJECTION 61%
INTRAVENOUS | Status: DC | PRN
  Administered 2018-03-27: 110 mL via INTRA_ARTERIAL

## 2018-03-27 MED ORDER — HEPARIN SODIUM (PORCINE) 1000 UNIT/ML IJ SOLN
INTRAMUSCULAR | Status: AC
  Filled 2018-03-27: qty 1

## 2018-03-27 MED ORDER — VERAPAMIL HCL 2.5 MG/ML IV SOLN
INTRAVENOUS | Status: AC
  Filled 2018-03-27: qty 2

## 2018-03-27 MED ORDER — MIDAZOLAM HCL 2 MG/2ML IJ SOLN
INTRAMUSCULAR | Status: DC | PRN
  Administered 2018-03-27: 1 mg via INTRAVENOUS

## 2018-03-27 MED ORDER — HEPARIN (PORCINE) IN NACL 1000-0.9 UT/500ML-% IV SOLN
INTRAVENOUS | Status: AC
  Filled 2018-03-27: qty 1000

## 2018-03-27 MED ORDER — MIDAZOLAM HCL 2 MG/2ML IJ SOLN
INTRAMUSCULAR | Status: AC
  Filled 2018-03-27: qty 2

## 2018-03-27 MED ORDER — SODIUM CHLORIDE 0.9 % WEIGHT BASED INFUSION
1.0000 mL/kg/h | INTRAVENOUS | Status: DC
  Administered 2018-03-27: 1 mL/kg/h via INTRAVENOUS

## 2018-03-27 MED ORDER — ONDANSETRON HCL 4 MG/2ML IJ SOLN: 4.0000 mg | Freq: Four times a day (QID) | INTRAMUSCULAR | Status: DC | PRN

## 2018-03-27 SURGICAL SUPPLY — 12 items
CATH INFINITI 5FR ANG PIGTAIL (CATHETERS) ×2 IMPLANT
CATH INFINITI 5FR JL4 (CATHETERS) ×2 IMPLANT
CATH INFINITI 5FR JL5 (CATHETERS) ×2 IMPLANT
CATH INFINITI JR4 5F (CATHETERS) ×2 IMPLANT
DEVICE CLOSURE MYNXGRIP 5F (Vascular Products) ×2 IMPLANT
KIT MANI 3VAL PERCEP (MISCELLANEOUS) ×2 IMPLANT
NEEDLE PERC 18GX7CM (NEEDLE) ×2 IMPLANT
PACK CARDIAC CATH (CUSTOM PROCEDURE TRAY) ×2 IMPLANT
PAD ONESTEP ZOLL R SERIES ADT (MISCELLANEOUS) ×2 IMPLANT
PANNUS RETENTION SYSTEM 2 PAD (MISCELLANEOUS) ×2 IMPLANT
SHEATH AVANTI 5FR X 11CM (SHEATH) ×2 IMPLANT
WIRE GUIDERIGHT .035X150 (WIRE) ×2 IMPLANT

## 2018-03-27 NOTE — Progress Notes (Signed)
Pt encouraged to watch EMMI videos. Pt states she watched them at heart failure clinic. Pt still not weighing herself everyday. RN encouraged daily weights and sodium regulation. I will continue to assess and encourage patient.

## 2018-03-27 NOTE — Progress Notes (Signed)
   03/27/18 1300  Clinical Encounter Type  Visited With Patient and family together  Visit Type Initial  Referral From Nurse    Chaplain received a page from the Unit Secretary [id number] an AD for the patient. Chaplain arrived to the patient's room to find her sitting in bed with her sister and daughter at the bedside. Chaplain assessed and confirmed capacity then secured a notary and witnesses. Document finalized in the presence of two witnesses, then hard copy given to patient and scanned electronic copy put on-file.

## 2018-03-27 NOTE — Progress Notes (Signed)
Pharmacy Electrolyte Monitoring Consult:  Pharmacy consulted to assist in monitoring and replacing electrolytes in this 53 y.o. female admitted on 03/25/2018 with Tachycardia and Shortness of Breath   Labs:  Sodium (mmol/L)  Date Value  03/27/2018 141  11/08/2017 150 (H)  01/15/2014 139   Potassium (mmol/L)  Date Value  03/27/2018 3.7  01/15/2014 3.9   Magnesium (mg/dL)  Date Value  03/26/2018 2.0  01/14/2014 1.8   Calcium (mg/dL)  Date Value  03/27/2018 8.7 (L)   Calcium, Total (mg/dL)  Date Value  01/15/2014 8.6   Albumin (g/dL)  Date Value  01/26/2018 3.7  11/08/2017 4.0  01/14/2014 3.2 (L)    Assessment/Plan: K 3.7  Mag (3/1= 2.0)  Scr 0.60 Patient on Furosemide 40mg  PO daily. Will order potassium 49mEq PO x 1 and recheck BMP with am labs.   Pharmacy will continue to monitor and adjust per consult.   Jermell Holeman A 03/27/2018 8:22 AM

## 2018-03-27 NOTE — Progress Notes (Signed)
ANTICOAGULATION CONSULT NOTE  Pharmacy Consult for Heparin Drip Management  Indication: chest pain/ACS  Allergies  Allergen Reactions  . Morphine And Related Hives  . Penicillins Other (See Comments)    Painful urination Has patient had a PCN reaction causing immediate rash, facial/tongue/throat swelling, SOB or lightheadedness with hypotension: yes Has patient had a PCN reaction causing severe rash involving mucus membranes or skin necrosis: no Has patient had a PCN reaction that required hospitalization no Has patient had a PCN reaction occurring within the last 10 years: no If all of the above answers are "NO", then may proceed with Cephalosporin use.     Patient Measurements: Height: 5\' 9"  (175.3 cm) Weight: 243 lb (110.2 kg) IBW/kg (Calculated) : 66.2 Heparin Dosing Weight: 90.9kg  Vital Signs: Temp: 97.6 F (36.4 C) (03/02 0453) Temp Source: Oral (03/02 0453) BP: 134/99 (03/02 0453) Pulse Rate: 58 (03/02 0453)  Labs: Recent Labs    03/25/18 1520  03/25/18 1724 03/25/18 2007 03/25/18 2113  03/26/18 0259 03/26/18 0824 03/26/18 1118 03/26/18 1659 03/27/18 0433 03/27/18 0524  HGB 13.6  --   --   --   --   --  11.7*  --   --   --  11.5*  --   HCT 42.9  --   --   --   --   --  38.0  --   --   --  39.6  --   PLT 295  --   --   --   --   --  267  --   --   --  251  --   APTT  --   --   --   --  24  --   --   --   --   --   --   --   LABPROT  --   --   --   --  11.9  --   --   --   --   --   --   --   INR  --   --   --   --  0.9  --   --   --   --   --   --   --   HEPARINUNFRC  --   --   --   --   --    < > 0.42  --  0.32 0.31  --  0.52  CREATININE  --   --  0.65  --   --   --  0.76  --   --   --  0.60  --   TROPONINI  --    < > 0.04* 0.03*  --   --  0.04* <0.03  --   --   --   --    < > = values in this interval not displayed.    Estimated Creatinine Clearance: 108.8 mL/min (by C-G formula based on SCr of 0.6 mg/dL).   Medications:  Scheduled:  . allopurinol   100 mg Oral Daily  . amLODipine  10 mg Oral Daily  . aspirin EC  81 mg Oral Daily  . atorvastatin  40 mg Oral Daily  . cloNIDine  0.1 mg Oral BID  . colchicine  0.6 mg Oral BID  . furosemide  40 mg Oral Daily  . gabapentin  300 mg Oral QHS  . hydrALAZINE  50 mg Oral TID  . lisinopril  20 mg Oral Daily  . metoprolol  200 mg Oral Daily  . pantoprazole  40 mg Oral Daily  . predniSONE  40 mg Oral Q breakfast  . sodium chloride flush  3 mL Intravenous Q12H  . sodium chloride flush  3 mL Intravenous Q12H   Infusions:  . sodium chloride    . sodium chloride    . sodium chloride     Followed by  . sodium chloride    . heparin 1,200 Units/hr (03/26/18 1519)    Assessment: Pharmacy consulted for heparin drip management for 53 yo female initiated on heparin for possible ACS. Patient has past medical history significant for CHF, hypertension, PE/DVT, ischemic stroke, dyslipidemia, COPD and possible atrial fibrillation. Patient is not currently on oral anticoagulation as an outpatient. Heparin is currently infusing at 1200 units/hr.   Goal of Therapy:  Heparin level 0.3-0.7 units/ml Monitor platelets by anticoagulation protocol: Yes   Plan:  03/02 @ 0530 HL 0.52 therapeutic. Will continue current rate and will recheck HL w/ am labs.  Tobie Lords, PharmD, BCPS Clinical Pharmacist 03/27/2018

## 2018-03-27 NOTE — Discharge Summary (Signed)
[name], is a 53 y.o. female  DOB [birth year]  MRN [id number].  Admission date:  03/25/2018  Admitting Physician  Bettey Costa, MD  Discharge Date:  03/27/2018   Primary MD  Volney American, PA-C  Recommendations for primary care physician for things to follow:  With PCP in 1 week   Admission Diagnosis  Tachycardia [R00.0] Dyspnea, unspecified type [R06.00] Chest pain, unspecified type [R07.9]   Discharge Diagnosis  Tachycardia [R00.0] Dyspnea, unspecified type [R06.00] Chest pain, unspecified type [R07.9]    Active Problems:   Unstable angina [facility])      Past Medical History:  Diagnosis Date  . A-fib ([place]) 2010  . Adrenal adenoma, left   . Alcohol abuse   . Allergy   . CHF (congestive heart failure) ([place])   . COPD (chronic obstructive pulmonary disease) ([place])   . Fatty liver   . GERD (gastroesophageal reflux disease)   . Gout   . Hypertension   . Mild cognitive impairment   . Non-healing non-surgical wound    right leg  . Obesity   . Pulmonary embolus ([place])   . Sleep apnea    uses Cpap  . Stroke ([place])   . Thyroid disease     Past Surgical History:  Procedure Laterality Date  . bilateral wrist fractures  2010  . LEFT HEART CATH AND CORONARY ANGIOGRAPHY N/A 03/27/2018   Procedure: LEFT HEART CATH AND CORONARY ANGIOGRAPHY;  Surgeon: Isaias Cowman, MD;  Location: [place] [facility];  Service: Cardiovascular;  Laterality: N/A;  . lt ankle fracture  2002       History of present illness and  Hospital Course:     Kindly see H&P for history of present illness and admission details, please review complete Labs, Consult reports and Test reports for all details in brief  HPI  from the history and physical done on the day of admission 53 year old female patient with history of COPD,  obstructive sleep apnea, essential hypertension, chronic diastolic heart failure admitted because of acute left-sided chest pain and concern for unstable angina.   Hospital Course   Acute left-sided chest pain with borderline elevation of troponins, EKG did not show acute ST-T changes.  Admitted to telemetry, started on heparin drip, seen by cardiology, troponins are slightly elevated up to 0.04 only.  Patient is taken to cardiac cath today, patient had normal coronaries, no intervention is done.  Patient advised to continue her home medicines for blood pressure.  Patient chest pain thought to be secondary to musculoskeletal chest pain. 2.  Essential hypertension: Controlled, continue hydralazine, clonidine, Norvasc, lisinopril, Toprol. 3.  Hyperlipidemia, LDL is slightly high, advised her to be compliant with her diet is low fats, continue statins. 4.  Hypokalemia: Replaced. 5. shortness of breath, CT angios chest negative for PE.  Patient had history of DVT.  Discussed the findings with patient, patient sister.   Discharge Condition:stable   Follow UP  Follow-up Information    Volney American, PA-C. Schedule an appointment as soon as possible for a visit in 1 week(s).   Specialty:  Family Medicine Contact information: [place] [address] [phone]        Philip Follow up on 04/03/2018.   Specialty:  Cardiology Why:  at 12:00pm Contact information: [address] [place] [phone]            Discharge Instructions  and  Discharge Medications      Allergies as of 03/27/2018      Reactions   Morphine And Related Hives   Penicillins Other (See Comments)   Painful urination Has patient had a PCN reaction causing immediate rash, facial/tongue/throat swelling, SOB or lightheadedness with hypotension: yes Has patient had a PCN reaction causing severe rash  involving mucus membranes or skin necrosis: no Has patient had a PCN reaction that required hospitalization no Has patient had a PCN reaction occurring within the last 10 years: no If all of the above answers are "NO", then may proceed with Cephalosporin use.      Medication List    TAKE these medications   albuterol 108 (90 Base) MCG/ACT inhaler Commonly known as:  PROVENTIL HFA;VENTOLIN HFA Inhale 2 puffs into the lungs every 6 (six) hours as needed for wheezing or shortness of breath.   allopurinol 100 MG tablet Commonly known as:  ZYLOPRIM Take 1 tablet (100 mg total) by mouth daily.   amLODipine 10 MG tablet Commonly known as:  NORVASC Take 1 tablet (10 mg total) by mouth daily.   aspirin EC 81 MG tablet Take 81 mg by mouth daily.   atorvastatin 40 MG tablet Commonly known as:  LIPITOR Take 1 tablet (40 mg total) by mouth daily.   butalbital-acetaminophen-caffeine 50-325-40 MG tablet Commonly known as:  FIORICET, ESGIC Take 1-2 tablets by mouth every 6 (six) hours as needed for headache.   cloNIDine 0.1 MG tablet Commonly known as:  CATAPRES Take 1 tablet (0.1 mg total) by mouth 2 (two) times daily.   colchicine 0.6 MG tablet Take 1 tablet (0.6 mg total) by mouth 2 (two) times daily. As needed for gout flares   furosemide 40 MG tablet Commonly known as:  LASIX Take 1 tablet (40 mg total) by mouth daily.   gabapentin 300 MG capsule Commonly known as:  NEURONTIN Take 1 capsule (300 mg total) by mouth at bedtime.   hydrALAZINE 50 MG tablet Commonly known as:  APRESOLINE TAKE 1 TABLET BY MOUTH THREE TIMES A DAY   lisinopril 20 MG tablet Commonly known as:  PRINIVIL,ZESTRIL Take 1 tablet (20 mg total) by mouth daily.   metoprolol 200 MG 24 hr tablet Commonly known as:  TOPROL XL Take 1 tablet (200 mg total) by mouth daily.   pantoprazole 40 MG tablet Commonly known as:  PROTONIX Take 1 tablet (40 mg total) by mouth daily.   potassium chloride SA 20 MEQ  tablet Commonly known as:  KLOR-CON M20 Take 1 tablet (20 mEq total) by mouth daily. What changed:  when to take this   predniSONE 20 MG tablet Commonly known as:  DELTASONE Take 2 tablets (40 mg total) by mouth daily with breakfast.         Diet and Activity recommendation: See Discharge Instructions above   Consults obtained -cardiology   Major procedures and Radiology Reports - PLEASE review detailed and final reports for all details, in brief -      Dg Chest 2 View  Result Date: 03/25/2018 CLINICAL DATA:  Pt comes from home via EMS after being called out for breathing issues. Pt was tachy with ems upon arrival with initial rate 150s-170s. Pt has had 324mg  aspirin and 550ml bolus and 2.5mg  of metoprolol which brought her rate down. Fell last week. RIGHT LOWER breast/chest pain. History of alcohol abuse, smoking. EXAM: CHEST - 2 VIEW COMPARISON:  01/26/2018 FINDINGS: Heart is enlarged. There is tortuosity of the thoracic aorta.  There is mild prominence of interstitial markings. LEFT UPPER lobe opacity is consistent with scarring. Numerous remote bilateral rib fractures. No pulmonary edema. Further wedge compression fracture of T8. IMPRESSION: 1. Cardiomegaly. 2. LEFT UPPER lobe scarring. 3. Further wedge compression fracture of T8. Electronically Signed   By: Nolon Nations M.D.   On: 03/25/2018 16:00   Ct Angio Chest Pe W And/or Wo Contrast  Result Date: 03/25/2018 CLINICAL DATA:  Chest pain and difficulty breathing today. EXAM: CT ANGIOGRAPHY CHEST WITH CONTRAST TECHNIQUE: Multidetector CT imaging of the chest was performed using the standard protocol during bolus administration of intravenous contrast. Multiplanar CT image reconstructions and MIPs were obtained to evaluate the vascular anatomy. CONTRAST:  96mL OMNIPAQUE IOHEXOL 350 MG/ML SOLN COMPARISON:  Chest CT 09/12/2012 FINDINGS: Cardiovascular: The heart is mildly enlarged. There is tortuosity and ectasia of the thoracic  aorta. Scattered atherosclerotic calcifications. No dissection. Branch vessels are patent but are also very tortuous. No obvious coronary artery calcifications. The pulmonary arterial tree is fairly well opacified. No filling defects to suggest pulmonary embolism. Mediastinum/Nodes: No mediastinal or hilar mass or adenopathy. Small scattered lymph nodes are noted. The esophagus is grossly normal. Lungs/Pleura: No acute pulmonary findings. No infiltrates, edema or effusions. Chronic scarring changes and loss of volume in the left hemithorax and chronic areas of bilateral pleural thickening. Upper Abdomen: No significant upper abdominal findings. Stable left adrenal gland nodule consistent with a benign adenoma. Musculoskeletal: Evidence of significant remote thoracic trauma with numerous bilateral healed rib fractures and healed clavicle fracture with massive callus formation. Review of the MIP images confirms the above findings. IMPRESSION: 1. No CT findings for acute pulmonary embolism. 2. Tortuosity and ectasia of the thoracic aorta but no focal aneurysm or dissection. 3. No mediastinal or hilar mass or adenopathy. 4. No acute pulmonary findings.  Chronic pulmonary scarring changes. 5. Evidence of severe remote trauma to the bony thorax. 6. Stable left adrenal gland adenoma. Aortic Atherosclerosis (ICD10-I70.0). Electronically Signed   By: Marijo Sanes M.D.   On: 03/25/2018 17:30    Micro Results     No results found for this or any previous visit (from the past 240 hour(s)).     Today   Subjective:   [name] today has no headache,no chest abdominal pain,no new weakness tingling or numbness, feels much better wants to go home today.   Objective:   Blood pressure (!) 138/103, pulse 65, temperature 97.7 F (36.5 C), temperature source Oral, resp. rate (!) 21, height 5\' 9"  (1.753 m), weight 110.2 kg, SpO2 96 %.   Intake/Output Summary (Last 24 hours) at 03/27/2018 1247 Last data filed at  03/27/2018 0706 Gross per 24 hour  Intake 480 ml  Output 700 ml  Net -220 ml    Exam Awake Alert, Oriented x 3, No new F.N deficits, Normal affect [place].AT,PERRAL Supple Neck,No JVD, No cervical lymphadenopathy appriciated.  Symmetrical Chest wall movement, Good air movement bilaterally, CTAB RRR,No Gallops,Rubs or new Murmurs, No Parasternal Heave +ve B.Sounds, Abd Soft, Non tender, No organomegaly appriciated, No rebound -guarding or rigidity. No Cyanosis, Clubbing or edema, No new Rash or bruise  Data Review   CBC w Diff:  Lab Results  Component Value Date   WBC 9.0 03/27/2018   HGB 11.5 (L) 03/27/2018   HGB 11.6 05/09/2017   HCT 39.6 03/27/2018   HCT 35.4 05/09/2017   PLT 251 03/27/2018   PLT 346 05/09/2017   LYMPHOPCT 19 01/26/2018   LYMPHOPCT 16.6 01/15/2014  MONOPCT 12 01/26/2018   MONOPCT 9.6 01/15/2014   EOSPCT 2 01/26/2018   EOSPCT 0.5 01/15/2014   BASOPCT 0 01/26/2018   BASOPCT 0.4 01/15/2014    CMP:  Lab Results  Component Value Date   NA 141 03/27/2018   NA 150 (H) 11/08/2017   NA 139 01/15/2014   K 3.7 03/27/2018   K 3.9 01/15/2014   CL 110 03/27/2018   CL 105 01/15/2014   CO2 22 03/27/2018   CO2 28 01/15/2014   BUN 17 03/27/2018   BUN 17 11/08/2017   BUN 17 01/15/2014   CREATININE 0.60 03/27/2018   CREATININE 1.36 (H) 01/15/2014   PROT 7.3 01/26/2018   PROT 6.4 11/08/2017   PROT 7.5 01/14/2014   ALBUMIN 3.7 01/26/2018   ALBUMIN 4.0 11/08/2017   ALBUMIN 3.2 (L) 01/14/2014   BILITOT 0.5 01/26/2018   BILITOT 0.6 11/08/2017   BILITOT 0.2 01/14/2014   ALKPHOS 76 01/26/2018   ALKPHOS 93 01/14/2014   AST 16 01/26/2018   AST 43 (H) 01/14/2014   ALT 13 01/26/2018   ALT 39 01/14/2014  .   Total Time in preparing paper work, data evaluation and todays exam - 39 minutes  Epifanio Lesches M.D on 03/27/2018 at 12:47 PM    Note: This dictation was prepared with Dragon dictation along with smaller phrase technology. Any transcriptional errors  that result from this process are unintentional.

## 2018-03-28 LAB — HIV ANTIBODY (ROUTINE TESTING W REFLEX): HIV Screen 4th Generation wRfx: NONREACTIVE

## 2018-03-30 NOTE — Progress Notes (Signed)
HPI   [name] is a 53 y/o female with a history of pulmonary embolus, obstructive sleep apnea, atrial fibrillation, COPD, gout, HTN, CVA, remote tobacco use and chronic heart failure.   Echo report from 03/26/2018 reviewed and showed an EF of 60-65% along with mild/ moderate TR. Echo report from 06/17/16 reviewed and showed an EF of 55-65%. Had a pharmacologic stress trest done 09/12/12 and had an estimated EF of 49%.   Catheterization done 03/27/2018 showed normal coronary arteries and normal left ventricular function.  Admitted 03/25/2018 due to chest pain. Cardiology consult was obtained. Cath done which was normal and she was discharged after 2 days. Was in the ED 01/26/2018 due to emesis and abdominal pain. CXR showed pneumonia so she was treated with antibiotics and zofran and was released. Was in the ED 11/16/17 due to headache where she was treated and released.   She presents today for a follow-up visit with a chief complaint of minimal fatigue upon moderate exertion. She describes this as chronic in nature having been present for several years. She has associated shortness of breath and intermittent chest pain along with this. She denies any difficulty sleeping, dizziness, abdominal distention, palpitations, pedal edema, chest pain, cough or weight gain. Hasn't been weighing herself daily but says that she'll start.   Past Medical History:  Diagnosis Date  . A-fib ([place]) 2010  . Adrenal adenoma, left   . Alcohol abuse   . Allergy   . CHF (congestive heart failure) ([place])   . COPD (chronic obstructive pulmonary disease) ([place])   . Fatty liver   . GERD (gastroesophageal reflux disease)   . Gout   . Hypertension   . Mild cognitive impairment   . Non-healing non-surgical wound    right leg  . Obesity   . Pulmonary embolus ([place])   . Sleep apnea    uses Cpap  . Stroke ([place])   . Thyroid disease    Past Surgical History:  Procedure Laterality Date  . bilateral wrist fractures  2010  .  LEFT HEART CATH AND CORONARY ANGIOGRAPHY N/A 03/27/2018   Procedure: LEFT HEART CATH AND CORONARY ANGIOGRAPHY;  Surgeon: Isaias Cowman, MD;  Location: [place] [facility];  Service: Cardiovascular;  Laterality: N/A;  . lt ankle fracture  2002   Family History  Problem Relation Age of Onset  . Prostate cancer Father   . Rectal cancer Sister   . Breast cancer Sister   . Cancer Maternal Grandmother   . Cancer Maternal Grandfather    Social History   Tobacco Use  . Smoking status: Former Smoker    Types: Cigarettes    Last attempt to quit: 01/26/2016    Years since quitting: 2.1  . Smokeless tobacco: Never Used  Substance Use Topics  . Alcohol use: Not Currently    Comment: occ   Allergies  Allergen Reactions  . Morphine And Related Hives  . Penicillins Other (See Comments)    Painful urination Has patient had a PCN reaction causing immediate rash, facial/tongue/throat swelling, SOB or lightheadedness with hypotension: yes Has patient had a PCN reaction causing severe rash involving mucus membranes or skin necrosis: no Has patient had a PCN reaction that required hospitalization no Has patient had a PCN reaction occurring within the last 10 years: no If all of the above answers are "NO", then may proceed with Cephalosporin use.    Prior to Admission medications   Medication Sig Start Date End Date Taking? Authorizing Provider  albuterol (PROVENTIL HFA;VENTOLIN HFA) 108 (90 Base) MCG/ACT inhaler Inhale 2 puffs into the lungs every 6 (six) hours as needed for wheezing or shortness of breath.   Yes [provider]  allopurinol (ZYLOPRIM) 100 MG tablet Take 1 tablet (100 mg total) by mouth daily. 11/08/17  Yes Volney American, PA-C  amLODipine (NORVASC) 10 MG tablet Take 1 tablet (10 mg total) by mouth daily. 04/07/17  Yes Volney American, PA-C  aspirin EC 81 MG tablet Take 81 mg by mouth daily.   Yes [provider]  atorvastatin (LIPITOR) 40 MG  tablet Take 1 tablet (40 mg total) by mouth daily. 11/14/17  Yes Volney American, PA-C  butalbital-acetaminophen-caffeine [place]) [phone] MG tablet Take 1-2 tablets by mouth every 6 (six) hours as needed for headache. 04/12/17 04/12/18 Yes Loney Hering, MD  cloNIDine (CATAPRES) 0.1 MG tablet Take 1 tablet (0.1 mg total) by mouth 2 (two) times daily. 11/08/17  Yes Volney American, PA-C  colchicine 0.6 MG tablet Take 1 tablet (0.6 mg total) by mouth 2 (two) times daily. As needed for gout flares 04/19/17  Yes Volney American, PA-C  furosemide (LASIX) 40 MG tablet Take 1 tablet (40 mg total) by mouth daily. 12/26/17 04/03/18 Yes Hackney, Otila Kluver A, FNP  gabapentin (NEURONTIN) 300 MG capsule Take 1 capsule (300 mg total) by mouth at bedtime. 02/17/18  Yes Volney American, PA-C  hydrALAZINE (APRESOLINE) 50 MG tablet TAKE 1 TABLET BY MOUTH THREE TIMES A DAY 11/24/17  Yes Volney American, PA-C  lisinopril (PRINIVIL,ZESTRIL) 20 MG tablet Take 1 tablet (20 mg total) by mouth daily. 03/24/17  Yes Darylene Price A, FNP  metoprolol (TOPROL XL) 200 MG 24 hr tablet Take 1 tablet (200 mg total) by mouth daily. 12/01/17  Yes Hackney, Otila Kluver A, FNP  pantoprazole (PROTONIX) 40 MG tablet Take 1 tablet (40 mg total) by mouth daily. 11/08/17  Yes Volney American, PA-C  potassium chloride SA (KLOR-CON M20) 20 MEQ tablet Take 1 tablet (20 mEq total) by mouth daily. Patient taking differently: Take 20 mEq by mouth 2 (two) times daily.  03/24/17  Yes Alisa Graff, FNP    Review of Systems  Constitutional: Positive for fatigue (with moderate exertion). Negative for appetite change.  HENT: Negative for congestion and postnasal drip.   Eyes: Negative.   Respiratory: Positive for shortness of breath. Negative for cough and chest tightness.   Cardiovascular: Negative for chest pain, palpitations and leg swelling.  Gastrointestinal: Negative for abdominal distention, abdominal  pain and nausea.  Endocrine: Negative.   Genitourinary: Negative.   Musculoskeletal: Negative for back pain.  Skin: Negative.   Allergic/Immunologic: Negative.   Neurological: Negative for dizziness and light-headedness.  Hematological: Negative for adenopathy. Does not bruise/bleed easily.  Psychiatric/Behavioral: Negative for dysphoric mood and sleep disturbance (wearing CPAP nightly; sleeping on 2 pillows). The patient is not nervous/anxious.    Vitals:   04/03/18 1157  BP: (!) 142/88  Pulse: 64  Resp: 18  SpO2: 96%  Weight: 241 lb 2 oz (109.4 kg)  Height: 5\' 10"  (1.778 m)   Wt Readings from Last 3 Encounters:  04/03/18 241 lb 2 oz (109.4 kg)  03/27/18 242 lb 15.2 oz (110.2 kg)  02/17/18 242 lb 14.4 oz (110.2 kg)   Lab Results  Component Value Date   CREATININE 0.60 03/27/2018   CREATININE 0.76 03/26/2018   CREATININE 0.65 03/25/2018    Physical Exam  Constitutional: She is oriented to person,  place, and time. She appears well-developed and well-nourished.  HENT:  Head: Normocephalic and atraumatic.  Neck: Normal range of motion. Neck supple. No JVD present.  Cardiovascular: Normal rate and regular rhythm.  Pulmonary/Chest: Effort normal. She has no wheezes. She has no rales.  Abdominal: Soft. She exhibits no distension. There is no abdominal tenderness.  Musculoskeletal:        General: No tenderness or edema.  Neurological: She is alert and oriented to person, place, and time.  Skin: Skin is warm and dry.  Psychiatric: She has a normal mood and affect. Her behavior is normal. Thought content normal.  Nursing note and vitals reviewed.   Assessment & Plan:  1: Chronic heart failure with preserved ejection fraction- - NYHA class II - euvolemic today - not weighing daily. Encouraged to weigh daily so that she can call for an overnight weight gain of >2 pounds or a weekly weight gain of >5 pounds.  - weight unchanged from last visit here 6 months ago - not adding  salt to her food. Reminded to closely follow a 2000mg  sodium diet  - saw cardiology Margarito Courser) 06/18/16 - does not get the flu vaccine; encouraged good handwashing - PharmD reconciled medications with the patient  2: HTN- - BP looks good today - saw PCP Orene Desanctis) 02/17/2018 & returns tomorrow - reviewed BMP done 03/27/2018 and it showed sodium 141, potassium 3.7, creatinine 0.60 and GFR >60  Patient did not bring her medications nor a list. Each medication was verbally reviewed with the patient and she was encouraged to bring the bottles to every visit to confirm accuracy of list.  Return in 3 months or sooner for any questions/problems before then.

## 2018-04-03 ENCOUNTER — Encounter: Payer: Self-pay | Admitting: Pharmacist

## 2018-04-03 ENCOUNTER — Ambulatory Visit: Payer: Medicaid Other | Attending: Family | Admitting: Family

## 2018-04-03 ENCOUNTER — Encounter: Payer: Self-pay | Admitting: Family

## 2018-04-03 VITALS — BP 142/88 | HR 64 | Resp 18 | Ht 70.0 in | Wt 241.1 lb

## 2018-04-03 DIAGNOSIS — Z87891 Personal history of nicotine dependence: Secondary | ICD-10-CM | POA: Insufficient documentation

## 2018-04-03 DIAGNOSIS — M109 Gout, unspecified: Secondary | ICD-10-CM | POA: Insufficient documentation

## 2018-04-03 DIAGNOSIS — Z86711 Personal history of pulmonary embolism: Secondary | ICD-10-CM | POA: Insufficient documentation

## 2018-04-03 DIAGNOSIS — Z6834 Body mass index (BMI) 34.0-34.9, adult: Secondary | ICD-10-CM | POA: Diagnosis not present

## 2018-04-03 DIAGNOSIS — E079 Disorder of thyroid, unspecified: Secondary | ICD-10-CM | POA: Insufficient documentation

## 2018-04-03 DIAGNOSIS — Z880 Allergy status to penicillin: Secondary | ICD-10-CM | POA: Insufficient documentation

## 2018-04-03 DIAGNOSIS — K219 Gastro-esophageal reflux disease without esophagitis: Secondary | ICD-10-CM | POA: Diagnosis not present

## 2018-04-03 DIAGNOSIS — Z7982 Long term (current) use of aspirin: Secondary | ICD-10-CM | POA: Diagnosis not present

## 2018-04-03 DIAGNOSIS — I5032 Chronic diastolic (congestive) heart failure: Secondary | ICD-10-CM | POA: Diagnosis not present

## 2018-04-03 DIAGNOSIS — Z885 Allergy status to narcotic agent status: Secondary | ICD-10-CM | POA: Insufficient documentation

## 2018-04-03 DIAGNOSIS — E669 Obesity, unspecified: Secondary | ICD-10-CM | POA: Insufficient documentation

## 2018-04-03 DIAGNOSIS — J449 Chronic obstructive pulmonary disease, unspecified: Secondary | ICD-10-CM | POA: Diagnosis not present

## 2018-04-03 DIAGNOSIS — Z79899 Other long term (current) drug therapy: Secondary | ICD-10-CM | POA: Diagnosis not present

## 2018-04-03 DIAGNOSIS — G4733 Obstructive sleep apnea (adult) (pediatric): Secondary | ICD-10-CM | POA: Diagnosis not present

## 2018-04-03 DIAGNOSIS — I10 Essential (primary) hypertension: Secondary | ICD-10-CM

## 2018-04-03 DIAGNOSIS — I110 Hypertensive heart disease with heart failure: Secondary | ICD-10-CM | POA: Diagnosis not present

## 2018-04-03 DIAGNOSIS — G3184 Mild cognitive impairment, so stated: Secondary | ICD-10-CM | POA: Diagnosis not present

## 2018-04-03 DIAGNOSIS — Z8673 Personal history of transient ischemic attack (TIA), and cerebral infarction without residual deficits: Secondary | ICD-10-CM | POA: Insufficient documentation

## 2018-04-03 DIAGNOSIS — I4891 Unspecified atrial fibrillation: Secondary | ICD-10-CM | POA: Insufficient documentation

## 2018-04-03 DIAGNOSIS — I509 Heart failure, unspecified: Secondary | ICD-10-CM | POA: Diagnosis present

## 2018-04-03 NOTE — Progress Notes (Signed)
[name] - PHARMACIST COUNSELING NOTE  ADHERENCE ASSESSMENT  Adherence strategy: keeps medications together   Do you ever forget to take your medication? [] Yes (1) [x] No (0)  Do you ever skip doses due to side effects? [] Yes (1) [x] No (0)  Do you have trouble affording your medicines? [] Yes (1) [x] No (0)  Are you ever unable to pick up your medication due to transportation difficulties? [] Yes (1) [x] No (0)  Do you ever stop taking your medications because you don't believe they are helping? [] Yes (1) [x] No (0)  Total score _0______    Recommendations given to patient about increasing adherence: None. Patient reports compliance with medication regimen and is familiar with the medications she is taking.  Guideline-Directed Medical Therapy/Evidence Based Medicine    ACE/ARB/ARNI: lisinopril 20 mg daily   Beta Blocker: metoprolol succinate 200 mg daily   Aldosterone Antagonist: none Diuretic: furosemide 40 mg daily    SUBJECTIVE   HPI: Here for follow up visit. Says she has a lot of side effects from her medications but it depends on the day what they are. Does not weigh daily.  Past Medical History:  Diagnosis Date  . A-fib ([place]) 2010  . Adrenal adenoma, left   . Alcohol abuse   . Allergy   . CHF (congestive heart failure) ([place])   . COPD (chronic obstructive pulmonary disease) ([place])   . Fatty liver   . GERD (gastroesophageal reflux disease)   . Gout   . Hypertension   . Mild cognitive impairment   . Non-healing non-surgical wound    right leg  . Obesity   . Pulmonary embolus ([place])   . Sleep apnea    uses Cpap  . Stroke ([place])   . Thyroid disease         OBJECTIVE    ECHO: Date 03/26/18, EF 60-65%, notes: mild/moderate TR  Cath: Date 03/27/18, notes: normal coronary arteries and left ventricular function  BMP Latest Ref Rng & Units 03/27/2018 03/26/2018 03/25/2018  Glucose 70 - 99 mg/dL 127(H) 114(H) 83  BUN 6 - 20 mg/dL  17 13 14   Creatinine 0.44 - 1.00 mg/dL 0.60 0.76 0.65  BUN/Creat Ratio 9 - 23 - - -  Sodium 135 - 145 mmol/L 141 142 142  Potassium 3.5 - 5.1 mmol/L 3.7 3.4(L) 3.0(L)  Chloride 98 - 111 mmol/L 110 110 109  CO2 22 - 32 mmol/L 22 25 24   Calcium 8.9 - 10.3 mg/dL 8.7(L) 8.4(L) 8.5(L)    ASSESSMENT 53 year old female with HFpEF. Says she has a lot of side effects from her medications but that they may be different depending on the day. When questioned, she said they can sometimes make her sleepy for days. She is familiar with all of her medications. Denies swelling. Endorses some shortness of breath and occasional dizziness/light-headedness that occurs randomly.   PLAN Continue medication regimen. Encouraged patient to weigh daily.     Time spent: 10 minutes  [place], Pharm.D. 04/03/2018 12:06 PM    Current Outpatient Medications:  .  albuterol (PROVENTIL HFA;VENTOLIN HFA) 108 (90 Base) MCG/ACT inhaler, Inhale 2 puffs into the lungs every 6 (six) hours as needed for wheezing or shortness of breath., Disp: , Rfl:  .  allopurinol (ZYLOPRIM) 100 MG tablet, Take 1 tablet (100 mg total) by mouth daily., Disp: 30 tablet, Rfl: 6 .  amLODipine (NORVASC) 10 MG tablet, Take 1 tablet (10 mg total) by mouth daily., Disp: 30 tablet,  Rfl: 1 .  aspirin EC 81 MG tablet, Take 81 mg by mouth daily., Disp: , Rfl:  .  atorvastatin (LIPITOR) 40 MG tablet, Take 1 tablet (40 mg total) by mouth daily., Disp: 90 tablet, Rfl: 1 .  butalbital-acetaminophen-caffeine (FIORICET, ESGIC) 50-325-40 MG tablet, Take 1-2 tablets by mouth every 6 (six) hours as needed for headache., Disp: 20 tablet, Rfl: 0 .  cloNIDine (CATAPRES) 0.1 MG tablet, Take 1 tablet (0.1 mg total) by mouth 2 (two) times daily., Disp: 60 tablet, Rfl: 6 .  colchicine 0.6 MG tablet, Take 1 tablet (0.6 mg total) by mouth 2 (two) times daily. As needed for gout flares, Disp: 30 tablet, Rfl: 1 .  furosemide (LASIX) 40 MG tablet, Take 1 tablet (40  mg total) by mouth daily., Disp: 90 tablet, Rfl: 3 .  gabapentin (NEURONTIN) 300 MG capsule, Take 1 capsule (300 mg total) by mouth at bedtime., Disp: 30 capsule, Rfl: 1 .  hydrALAZINE (APRESOLINE) 50 MG tablet, TAKE 1 TABLET BY MOUTH THREE TIMES A DAY, Disp: 90 tablet, Rfl: 2 .  lisinopril (PRINIVIL,ZESTRIL) 20 MG tablet, Take 1 tablet (20 mg total) by mouth daily., Disp: 30 tablet, Rfl: 5 .  metoprolol (TOPROL XL) 200 MG 24 hr tablet, Take 1 tablet (200 mg total) by mouth daily., Disp: 90 tablet, Rfl: 3 .  pantoprazole (PROTONIX) 40 MG tablet, Take 1 tablet (40 mg total) by mouth daily., Disp: 30 tablet, Rfl: 3 .  potassium chloride SA (KLOR-CON M20) 20 MEQ tablet, Take 1 tablet (20 mEq total) by mouth daily. (Patient taking differently: Take 20 mEq by mouth 2 (two) times daily. ), Disp: 30 tablet, Rfl: 5 .  predniSONE (DELTASONE) 20 MG tablet, Take 2 tablets (40 mg total) by mouth daily with breakfast. (Patient not taking: Reported on 04/03/2018), Disp: 10 tablet, Rfl: 0   COUNSELING POINTS/CLINICAL PEARLS  Metoprolol Succinate (Goal: 200 mg once daily) Warn patient to avoid activities requiring mental alertness or coordination until drug effects are realized, as drug may cause dizziness. Tell patient planning major surgery with anesthesia to alert physician that drug is being used, as drug impairs ability of heart to respond to reflex adrenergic stimuli. Drug may cause diarrhea, fatigue, headache, or depression. Advise diabetic patient to carefully monitor blood glucose as drug may mask symptoms of hypoglycemia. Patient should take extended-release tablet with or immediately following meals. Counsel patient against sudden discontinuation of drug, as this may precipitate hypertension, angina, or myocardial infarction. In the event of a missed dose, counsel patient to skip the missed dose and maintain a regular dosing schedule. Lisinopril (Goal: 20 to 40 mg once daily)  This drug may cause  nausea, vomiting, dizziness, headache, or angioedema of face, lips, throat, or intestines.  Instruct patient to report signs/symptoms of hypotension, or a persistent cough.  Advise patient against sudden discontinuation of drug. Furosemide  Drug causes sun-sensitivity. Advise patient to use sunscreen and avoid tanning beds. Patient should avoid activities requiring coordination until drug effects are realized, as drug may cause dizziness, vertigo, or blurred vision. This drug may cause hyperglycemia, hyperuricemia, constipation, diarrhea, loss of appetite, nausea, vomiting, purpuric disorder, cramps, spasticity, asthenia, headache, paresthesia, or scaling eczema. Instruct patient to report unusual bleeding/bruising or signs/symptoms of hypotension, infection, pancreatitis, or ototoxicity (tinnitus, hearing impairment). Advise patient to report signs/symptoms of a severe skin reactions (flu-like symptoms, spreading red rash, or skin/mucous membrane blistering) or erythema multiforme. Instruct patient to eat high-potassium foods during drug therapy, as directed by healthcare professional.  Patient should not drink alcohol while taking this drug.   DRUGS TO AVOID IN HEART FAILURE  Drug or Class Mechanism  Analgesics . NSAIDs . COX-2 inhibitors . Glucocorticoids  Sodium and water retention, increased systemic vascular resistance, decreased response to diuretics   Diabetes Medications . Metformin . Thiazolidinediones o Rosiglitazone (Avandia) o Pioglitazone (Actos) . DPP4 Inhibitors o Saxagliptin (Onglyza) o Sitagliptin (Januvia)   Lactic acidosis Possible calcium channel blockade   Unknown  Antiarrhythmics . Class I  o Flecainide o Disopyramide . Class III o Sotalol . Other o Dronedarone  Negative inotrope, proarrhythmic   Proarrhythmic, beta blockade  Negative inotrope  Antihypertensives . Alpha Blockers o Doxazosin . Calcium Channel  Blockers o Diltiazem o Verapamil o Nifedipine . Central Alpha Adrenergics o Moxonidine . Peripheral Vasodilators o Minoxidil  Increases renin and aldosterone  Negative inotrope    Possible sympathetic withdrawal  Unknown  Anti-infective . Itraconazole . Amphotericin B  Negative inotrope Unknown  Hematologic . Anagrelide . Cilostazol   Possible inhibition of PD IV Inhibition of PD III causing arrhythmias  Neurologic/Psychiatric . Stimulants . Anti-Seizure Drugs o Carbamazepine o Pregabalin . Antidepressants o Tricyclics o Citalopram . Parkinsons o Bromocriptine o Pergolide o Pramipexole . Antipsychotics o Clozapine . Antimigraine o Ergotamine o Methysergide . Appetite suppressants . Bipolar o Lithium  Peripheral alpha and beta agonist activity  Negative inotrope and chronotrope Calcium channel blockade  Negative inotrope, proarrhythmic Dose-dependent QT prolongation  Excessive serotonin activity/valvular damage Excessive serotonin activity/valvular damage Unknown  IgE mediated hypersensitivy, calcium channel blockade  Excessive serotonin activity/valvular damage Excessive serotonin activity/valvular damage Valvular damage  Direct myofibrillar degeneration, adrenergic stimulation  Antimalarials . Chloroquine . Hydroxychloroquine Intracellular inhibition of lysosomal enzymes  Urologic Agents . Alpha Blockers o Doxazosin o Prazosin o Tamsulosin o Terazosin  Increased renin and aldosterone  Adapted from Page RL, et al. "Drugs That May Cause or Exacerbate Heart Failure: A Scientific Statement from the [place]." Circulation 2016; 160:V37-T06. DOI: 10.1161/CIR.0000000000000426   MEDICATION ADHERENCES TIPS AND STRATEGIES 1. Taking medication as prescribed improves patient outcomes in heart failure (reduces hospitalizations, improves symptoms, increases survival) 2. Side effects of medications can be managed by decreasing  doses, switching agents, stopping drugs, or adding additional therapy. Please let someone in the [facility] know if you have having bothersome side effects so we can modify your regimen. Do not alter your medication regimen without talking to [place].  3. Medication reminders can help patients remember to take drugs on time. If you are missing or forgetting doses you can try linking behaviors, using pill boxes, or an electronic reminder like an alarm on your phone or an app. Some people can also get automated phone calls as medication reminders.

## 2018-04-03 NOTE — Patient Instructions (Addendum)
Resume weighing daily and call for an overnight weight gain of > 2 pounds or a weekly weight gain of >5 pounds. 

## 2018-04-04 ENCOUNTER — Ambulatory Visit (INDEPENDENT_AMBULATORY_CARE_PROVIDER_SITE_OTHER): Payer: Medicaid Other | Admitting: Family Medicine

## 2018-04-04 ENCOUNTER — Encounter: Payer: Self-pay | Admitting: Family Medicine

## 2018-04-04 VITALS — BP 147/109 | HR 96 | Temp 98.1°F | Ht 66.5 in | Wt 242.8 lb

## 2018-04-04 DIAGNOSIS — I5032 Chronic diastolic (congestive) heart failure: Secondary | ICD-10-CM

## 2018-04-04 DIAGNOSIS — I10 Essential (primary) hypertension: Secondary | ICD-10-CM

## 2018-04-04 DIAGNOSIS — I4891 Unspecified atrial fibrillation: Secondary | ICD-10-CM | POA: Diagnosis not present

## 2018-04-04 DIAGNOSIS — R079 Chest pain, unspecified: Secondary | ICD-10-CM

## 2018-04-04 NOTE — Progress Notes (Signed)
BP (!) 147/109 (BP Location: Left Arm, Patient Position: Sitting, Cuff Size: Large) Comment: Patient states she has not taken her medication this morning  Pulse 96   Temp 98.1 F (36.7 C) (Oral)   Ht 5' 6.5" (1.689 m)   Wt 242 lb 12.8 oz (110.1 kg)   SpO2 98%   BMI 38.60 kg/m    Subjective:    Patient ID: [name], female    DOB: [birth year], 53 y.o.   MRN: [id number]  HPI: [name] is a 53 y.o. female  Chief Complaint  Patient presents with  . Hospitalization Follow-up    Patient states she's feeling a little better. Saw Cardiology yesterday.    Here today for hospital f/u for tachycardia, left sided CP, dyspnea. She had mildly elevated troponins on admission so cardiac catheterization was performed which was negative for blockages. Per Cardiology consult, her CP is thought to be related to musculoskeletal causes. She was d/c'd home on typical regimen. Saw HF clinic yesterday, no medication changes made at that time. Patient reports feeling much better since d/c home, with no lingering concerns.   Relevant past medical, surgical, family and social history reviewed and updated as indicated. Interim medical history since our last visit reviewed. Allergies and medications reviewed and updated.  Review of Systems  Per HPI unless specifically indicated above     Objective:    BP (!) 147/109 (BP Location: Left Arm, Patient Position: Sitting, Cuff Size: Large) Comment: Patient states she has not taken her medication this morning  Pulse 96   Temp 98.1 F (36.7 C) (Oral)   Ht 5' 6.5" (1.689 m)   Wt 242 lb 12.8 oz (110.1 kg)   SpO2 98%   BMI 38.60 kg/m   Wt Readings from Last 3 Encounters:  04/04/18 242 lb 12.8 oz (110.1 kg)  04/03/18 241 lb 2 oz (109.4 kg)  03/27/18 242 lb 15.2 oz (110.2 kg)    Physical Exam Vitals signs and nursing note reviewed.  Constitutional:      Appearance: Normal appearance. She is not ill-appearing.  HENT:     Head: Atraumatic.    Eyes:     Extraocular Movements: Extraocular movements intact.     Conjunctiva/sclera: Conjunctivae normal.  Neck:     Musculoskeletal: Normal range of motion and neck supple.  Cardiovascular:     Rate and Rhythm: Normal rate and regular rhythm.     Heart sounds: Normal heart sounds.  Pulmonary:     Effort: Pulmonary effort is normal.     Breath sounds: Normal breath sounds. No wheezing or rales.  Musculoskeletal: Normal range of motion.        General: No swelling.  Skin:    General: Skin is warm and dry.  Neurological:     Mental Status: She is alert and oriented to person, place, and time. Mental status is at baseline.  Psychiatric:        Mood and Affect: Mood normal.        Thought Content: Thought content normal.        Judgment: Judgment normal.     Results for orders placed or performed during the hospital encounter of 03/25/18  CBC  Result Value Ref Range   WBC 13.2 (H) 4.0 - 10.5 K/uL   RBC 4.92 3.87 - 5.11 MIL/uL   Hemoglobin 13.6 12.0 - 15.0 g/dL   HCT 42.9 36.0 - 46.0 %   MCV 87.2 80.0 - 100.0 fL   MCH  27.6 26.0 - 34.0 pg   MCHC 31.7 30.0 - 36.0 g/dL   RDW 15.0 11.5 - 15.5 %   Platelets 295 150 - 400 K/uL   nRBC 0.0 0.0 - 0.2 %  Basic metabolic panel  Result Value Ref Range   Sodium 142 135 - 145 mmol/L   Potassium 3.0 (L) 3.5 - 5.1 mmol/L   Chloride 109 98 - 111 mmol/L   CO2 24 22 - 32 mmol/L   Glucose, Bld 83 70 - 99 mg/dL   BUN 14 6 - 20 mg/dL   Creatinine, Ser 0.65 0.44 - 1.00 mg/dL   Calcium 8.5 (L) 8.9 - 10.3 mg/dL   GFR calc non Af Amer >60 >60 mL/min   GFR calc Af Amer >60 >60 mL/min   Anion gap 9 5 - 15  Troponin I -  Result Value Ref Range   Troponin I 0.04 (HH) <0.03 ng/mL  Troponin I - Once-Timed  Result Value Ref Range   Troponin I 0.03 (HH) <0.03 ng/mL  Troponin I - Now Then Q6H  Result Value Ref Range   Troponin I 0.04 (HH) <0.03 ng/mL  Troponin I - Now Then Q6H  Result Value Ref Range   Troponin I <0.03 <0.03 ng/mL   Magnesium  Result Value Ref Range   Magnesium 1.8 1.7 - 2.4 mg/dL  Lipid panel  Result Value Ref Range   Cholesterol 192 0 - 200 mg/dL   Triglycerides 100 <150 mg/dL   HDL 61 >40 mg/dL   Total CHOL/HDL Ratio 3.1 RATIO   VLDL 20 0 - 40 mg/dL   LDL Cholesterol 111 (H) 0 - 99 mg/dL  CBC  Result Value Ref Range   WBC 10.1 4.0 - 10.5 K/uL   RBC 4.21 3.87 - 5.11 MIL/uL   Hemoglobin 11.7 (L) 12.0 - 15.0 g/dL   HCT 38.0 36.0 - 46.0 %   MCV 90.3 80.0 - 100.0 fL   MCH 27.8 26.0 - 34.0 pg   MCHC 30.8 30.0 - 36.0 g/dL   RDW 15.1 11.5 - 15.5 %   Platelets 267 150 - 400 K/uL   nRBC 0.0 0.0 - 0.2 %  Basic metabolic panel  Result Value Ref Range   Sodium 142 135 - 145 mmol/L   Potassium 3.4 (L) 3.5 - 5.1 mmol/L   Chloride 110 98 - 111 mmol/L   CO2 25 22 - 32 mmol/L   Glucose, Bld 114 (H) 70 - 99 mg/dL   BUN 13 6 - 20 mg/dL   Creatinine, Ser 0.76 0.44 - 1.00 mg/dL   Calcium 8.4 (L) 8.9 - 10.3 mg/dL   GFR calc non Af Amer >60 >60 mL/min   GFR calc Af Amer >60 >60 mL/min   Anion gap 7 5 - 15  HIV antibody (Routine Testing)  Result Value Ref Range   HIV Screen 4th Generation wRfx Non Reactive Non Reactive  Hemoglobin A1c  Result Value Ref Range   Hgb A1c MFr Bld 6.0 (H) 4.8 - 5.6 %   Mean Plasma Glucose 125.5 mg/dL  APTT  Result Value Ref Range   aPTT 24 24 - 36 seconds  Protime-INR  Result Value Ref Range   Prothrombin Time 11.9 11.4 - 15.2 seconds   INR 0.9 0.8 - 1.2  Heparin level (unfractionated)  Result Value Ref Range   Heparin Unfractionated 0.42 0.30 - 0.70 IU/mL  Magnesium  Result Value Ref Range   Magnesium 2.0 1.7 - 2.4 mg/dL  Heparin level (unfractionated)  Result Value Ref Range   Heparin Unfractionated 0.32 0.30 - 0.70 IU/mL  Heparin level (unfractionated)  Result Value Ref Range   Heparin Unfractionated 0.31 0.30 - 0.70 IU/mL  Basic metabolic panel  Result Value Ref Range   Sodium 141 135 - 145 mmol/L   Potassium 3.7 3.5 - 5.1 mmol/L   Chloride 110 98 -  111 mmol/L   CO2 22 22 - 32 mmol/L   Glucose, Bld 127 (H) 70 - 99 mg/dL   BUN 17 6 - 20 mg/dL   Creatinine, Ser 0.60 0.44 - 1.00 mg/dL   Calcium 8.7 (L) 8.9 - 10.3 mg/dL   GFR calc non Af Amer >60 >60 mL/min   GFR calc Af Amer >60 >60 mL/min   Anion gap 9 5 - 15  CBC  Result Value Ref Range   WBC 9.0 4.0 - 10.5 K/uL   RBC 4.21 3.87 - 5.11 MIL/uL   Hemoglobin 11.5 (L) 12.0 - 15.0 g/dL   HCT 39.6 36.0 - 46.0 %   MCV 94.1 80.0 - 100.0 fL   MCH 27.3 26.0 - 34.0 pg   MCHC 29.0 (L) 30.0 - 36.0 g/dL   RDW 15.0 11.5 - 15.5 %   Platelets 251 150 - 400 K/uL   nRBC 0.0 0.0 - 0.2 %  Heparin level (unfractionated)  Result Value Ref Range   Heparin Unfractionated 0.52 0.30 - 0.70 IU/mL  I-STAT creatinine  Result Value Ref Range   Creatinine, Ser 0.60 0.44 - 1.00 mg/dL  ECHOCARDIOGRAM COMPLETE  Result Value Ref Range   Weight 3,848 oz   Height 69 in   BP 119/84 mmHg      Assessment & Plan:   Problem List Items Addressed This Visit      Cardiovascular and Mediastinum   Essential hypertension (Chronic)    Elevated today but did not take her medicines. Was at goal per HF clinic yesterday. Discussed good compliance with regimen, start taking home readings and logging them. Call with persistent abnormal readings      Chronic diastolic heart failure ([place]) (Chronic)    Following with HF clinic, last visit was yesterday. Euvolemic without current sxs      A-fib ([place])    Rate well controlled on metoprolol       Other Visit Diagnoses    Chest pain, unspecified type    -  Primary   Cardiac cath negative during admission. Suspected to be muscular, and improved now. Following with Cardiology regularly. Return precautions given       Follow up plan: Return in about 4 weeks (around 05/02/2018) for 6 month f/u.

## 2018-04-08 NOTE — Assessment & Plan Note (Signed)
Elevated today but did not take her medicines. Was at goal per HF clinic yesterday. Discussed good compliance with regimen, start taking home readings and logging them. Call with persistent abnormal readings

## 2018-04-08 NOTE — Assessment & Plan Note (Signed)
Rate well controlled on metoprolol

## 2018-04-08 NOTE — Assessment & Plan Note (Signed)
Following with HF clinic, last visit was yesterday. Euvolemic without current sxs

## 2018-04-25 ENCOUNTER — Emergency Department: Payer: Medicaid Other

## 2018-04-25 ENCOUNTER — Other Ambulatory Visit: Payer: Self-pay

## 2018-04-25 ENCOUNTER — Emergency Department
Admission: EM | Admit: 2018-04-25 | Discharge: 2018-04-25 | Disposition: A | Discharge status: Home / Self Care | Payer: Medicaid Other | Attending: Emergency Medicine | Admitting: Emergency Medicine

## 2018-04-25 DIAGNOSIS — I110 Hypertensive heart disease with heart failure: Secondary | ICD-10-CM | POA: Insufficient documentation

## 2018-04-25 DIAGNOSIS — R197 Diarrhea, unspecified: Secondary | ICD-10-CM | POA: Insufficient documentation

## 2018-04-25 DIAGNOSIS — J449 Chronic obstructive pulmonary disease, unspecified: Secondary | ICD-10-CM | POA: Insufficient documentation

## 2018-04-25 DIAGNOSIS — R51 Headache: Secondary | ICD-10-CM

## 2018-04-25 DIAGNOSIS — R112 Nausea with vomiting, unspecified: Secondary | ICD-10-CM

## 2018-04-25 DIAGNOSIS — I10 Essential (primary) hypertension: Secondary | ICD-10-CM | POA: Insufficient documentation

## 2018-04-25 DIAGNOSIS — Z87891 Personal history of nicotine dependence: Secondary | ICD-10-CM | POA: Diagnosis not present

## 2018-04-25 DIAGNOSIS — R8271 Bacteriuria: Secondary | ICD-10-CM | POA: Insufficient documentation

## 2018-04-25 DIAGNOSIS — R05 Cough: Secondary | ICD-10-CM

## 2018-04-25 DIAGNOSIS — R519 Headache, unspecified: Secondary | ICD-10-CM

## 2018-04-25 DIAGNOSIS — I5032 Chronic diastolic (congestive) heart failure: Secondary | ICD-10-CM | POA: Insufficient documentation

## 2018-04-25 DIAGNOSIS — R059 Cough, unspecified: Secondary | ICD-10-CM

## 2018-04-25 LAB — COMPREHENSIVE METABOLIC PANEL
ALT: 10 U/L (ref 0–44)
AST: 14 U/L — ABNORMAL LOW (ref 15–41)
Albumin: 3.9 g/dL (ref 3.5–5.0)
Alkaline Phosphatase: 65 U/L (ref 38–126)
Anion gap: 14 (ref 5–15)
BUN: 12 mg/dL (ref 6–20)
CALCIUM: 9.2 mg/dL (ref 8.9–10.3)
CO2: 25 mmol/L (ref 22–32)
Chloride: 105 mmol/L (ref 98–111)
Creatinine, Ser: 0.59 mg/dL (ref 0.44–1.00)
Glucose, Bld: 105 mg/dL — ABNORMAL HIGH (ref 70–99)
Potassium: 3.5 mmol/L (ref 3.5–5.1)
Sodium: 144 mmol/L (ref 135–145)
Total Bilirubin: 0.8 mg/dL (ref 0.3–1.2)
Total Protein: 7.4 g/dL (ref 6.5–8.1)

## 2018-04-25 LAB — BRAIN NATRIURETIC PEPTIDE: B Natriuretic Peptide: 111 pg/mL — ABNORMAL HIGH (ref 0.0–100.0)

## 2018-04-25 LAB — CBC WITH DIFFERENTIAL/PLATELET
Abs Immature Granulocytes: 0.04 10*3/uL (ref 0.00–0.07)
BASOS PCT: 0 %
Basophils Absolute: 0 10*3/uL (ref 0.0–0.1)
EOS ABS: 0.1 10*3/uL (ref 0.0–0.5)
Eosinophils Relative: 1 %
HCT: 41.5 % (ref 36.0–46.0)
Hemoglobin: 12.6 g/dL (ref 12.0–15.0)
Immature Granulocytes: 0 %
Lymphocytes Relative: 20 %
Lymphs Abs: 2.1 10*3/uL (ref 0.7–4.0)
MCH: 27.6 pg (ref 26.0–34.0)
MCHC: 30.4 g/dL (ref 30.0–36.0)
MCV: 91 fL (ref 80.0–100.0)
Monocytes Absolute: 1 10*3/uL (ref 0.1–1.0)
Monocytes Relative: 9 %
NRBC: 0 % (ref 0.0–0.2)
Neutro Abs: 7.5 10*3/uL (ref 1.7–7.7)
Neutrophils Relative %: 70 %
Platelets: 298 10*3/uL (ref 150–400)
RBC: 4.56 MIL/uL (ref 3.87–5.11)
RDW: 14.6 % (ref 11.5–15.5)
WBC: 10.7 10*3/uL — ABNORMAL HIGH (ref 4.0–10.5)

## 2018-04-25 LAB — URINALYSIS, COMPLETE (UACMP) WITH MICROSCOPIC
Bilirubin Urine: NEGATIVE
Glucose, UA: NEGATIVE mg/dL
Hgb urine dipstick: NEGATIVE
Ketones, ur: NEGATIVE mg/dL
Leukocytes,Ua: NEGATIVE
Nitrite: NEGATIVE
Protein, ur: NEGATIVE mg/dL
Specific Gravity, Urine: 1.006 (ref 1.005–1.030)
pH: 8 (ref 5.0–8.0)

## 2018-04-25 LAB — LACTIC ACID, PLASMA: Lactic Acid, Venous: 1.3 mmol/L (ref 0.5–1.9)

## 2018-04-25 LAB — PROCALCITONIN: Procalcitonin: <0.1 ng/mL

## 2018-04-25 LAB — INFLUENZA PANEL BY PCR (TYPE A & B)
Influenza A By PCR: NEGATIVE
Influenza B By PCR: NEGATIVE

## 2018-04-25 LAB — TROPONIN I

## 2018-04-25 MED ORDER — METOPROLOL TARTRATE 5 MG/5ML IV SOLN
10.0000 mg | Freq: Once | INTRAVENOUS | Status: AC
  Administered 2018-04-25: 10 mg via INTRAVENOUS
  Filled 2018-04-25: qty 10

## 2018-04-25 MED ORDER — ONDANSETRON HCL 4 MG/2ML IJ SOLN
4.0000 mg | Freq: Once | INTRAMUSCULAR | Status: AC
  Administered 2018-04-25: 4 mg via INTRAVENOUS
  Filled 2018-04-25: qty 2

## 2018-04-25 MED ORDER — ACETAMINOPHEN 500 MG PO TABS
1000.0000 mg | ORAL_TABLET | Freq: Once | ORAL | Status: AC
  Administered 2018-04-25: 1000 mg via ORAL
  Filled 2018-04-25: qty 2

## 2018-04-25 MED ORDER — ONDANSETRON 4 MG PO TBDP: 4.0000 mg | ORAL_TABLET | Freq: Three times a day (TID) | ORAL | 0 refills | Status: DC | PRN

## 2018-04-25 MED ORDER — CEPHALEXIN 500 MG PO CAPS
500.0000 mg | ORAL_CAPSULE | Freq: Once | ORAL | Status: AC
  Administered 2018-04-25: 500 mg via ORAL
  Filled 2018-04-25: qty 1

## 2018-04-25 MED ORDER — HYDRALAZINE HCL 20 MG/ML IJ SOLN
10.0000 mg | Freq: Once | INTRAMUSCULAR | Status: AC
  Administered 2018-04-25: 10 mg via INTRAVENOUS
  Filled 2018-04-25: qty 1

## 2018-04-25 MED ORDER — CEPHALEXIN 500 MG PO CAPS: 500.0000 mg | ORAL_CAPSULE | Freq: Four times a day (QID) | ORAL | 0 refills | Status: AC

## 2018-04-25 NOTE — ED Notes (Signed)
X-ray at bedside

## 2018-04-25 NOTE — ED Notes (Signed)
Patient transported to CT 

## 2018-04-25 NOTE — ED Provider Notes (Addendum)
[facility] Emergency Department Provider Note  ____________________________________________  Time seen: Approximately 3:25 AM  I have reviewed the triage vital signs and the nursing notes.   HISTORY  Chief Complaint Headache and Emesis    HPI ELIANIE Randall is a 53 y.o. female history of A. fib and PE on asa only, COPD on 2 to 3 L nasal cannula at night only, CHF, presenting for shortness of breath.  The patient reports that for the past 2 to 3 days, she has had a cough productive of phlegm associated with a mild sore throat, feeling hot and cold but no known fever, and shortness of breath.  She has not had any anosmia or change in her taste.  She then developed nausea vomiting and diarrhea without any abdominal pain.  She has also been having a headache and feeling short of breath with exertion.  She has not had any known sick contacts, nor any contacts who travel.  She mostly stays at home during this pandemic.   Reviewed the patient's chart and she was discharged 03/27/2018 in the setting of chest pain and shortness of breath where she underwent cardiac catheterization that showed no coronary artery disease, and had CT angiography that was negative for PE.   Past Medical History:  Diagnosis Date  . A-fib ([place]) 2010  . Adrenal adenoma, left   . Alcohol abuse   . Allergy   . CHF (congestive heart failure) ([place])   . COPD (chronic obstructive pulmonary disease) ([place])   . Fatty liver   . GERD (gastroesophageal reflux disease)   . Gout   . Hypertension   . Mild cognitive impairment   . Non-healing non-surgical wound    right leg  . Obesity   . Pulmonary embolus ([place])   . Sleep apnea    uses Cpap  . Stroke ([place])   . Thyroid disease     Patient Active Problem List   Diagnosis Date Noted  . Unstable angina ([place]) 03/25/2018  . Gout 05/09/2017  . COPD (chronic obstructive pulmonary disease) ([place]) 04/10/2017  . Sleep apnea 04/10/2017  . History of  pulmonary embolism 04/10/2017  . History of thyroid disease 04/10/2017  . Hyperlipidemia 12/31/2016  . Essential hypertension 05/26/2016  . Chronic diastolic heart failure ([place]) 05/26/2016  . Hypokalemia 05/26/2016  . A-fib ([place]) 01/26/2008    Past Surgical History:  Procedure Laterality Date  . bilateral wrist fractures  2010  . LEFT HEART CATH AND CORONARY ANGIOGRAPHY N/A 03/27/2018   Procedure: LEFT HEART CATH AND CORONARY ANGIOGRAPHY;  Surgeon: Isaias Cowman, MD;  Location: [place] [facility];  Service: Cardiovascular;  Laterality: N/A;  . lt ankle fracture  2002    Current Outpatient Rx  . Order #: [id number] Class: Historical Med  . Order #: [id number] Class: Normal  . Order #: [id number] Class: Normal  . Order #: [id number] Class: Historical Med  . Order #: [id number] Class: Normal  . Order #: [id number] Class: Print  . Order #: [id number] Class: Normal  . Order #: [id number] Class: Normal  . Order #: [id number] Class: Normal  . Order #: [id number] Class: Normal  . Order #: [id number] Class: Normal  . Order #: [id number] Class: Normal  . Order #: [id number] Class: Normal  . Order #: [id number] Class: Print  . Order #: [id number] Class: Normal  . Order #: [id number] Class: Normal    Allergies Morphine and related and Penicillins  Family History  Problem Relation Age of Onset  . Prostate cancer Father   . Rectal cancer Sister   . Breast  cancer Sister   . Cancer Maternal Grandmother   . Cancer Maternal Grandfather     Social History Social History   Tobacco Use  . Smoking status: Former Smoker    Types: Cigarettes    Last attempt to quit: 01/26/2016    Years since quitting: 2.2  . Smokeless tobacco: Never Used  Substance Use Topics  . Alcohol use: Not Currently    Comment: Occassionally  . Drug use: No    Review of Systems Constitutional: No fever/chills but subjectively feeling hot and cold.  Positive general malaise.. Eyes: No visual changes.  No eye discharge. ENT: Mild  sore throat. No congestion or rhinorrhea. Cardiovascular: Denies chest pain. Denies palpitations. Respiratory: Positive shortness of breath.  Positive productive cough. Gastrointestinal: No abdominal pain.  Positive nausea vomiting and diarrhea.  No constipation. Genitourinary: Negative for dysuria. Musculoskeletal: Negative for back pain.  No lower extremity swelling or calf pain. Skin: Negative for rash. Neurological: Negative for headaches. No focal numbness, tingling or weakness.     ____________________________________________   PHYSICAL EXAM:  VITAL SIGNS: ED Triage Vitals  Enc Vitals Group     BP 04/25/18 0312 (!) 202/129     Pulse Rate 04/25/18 0308 72     Resp 04/25/18 0308 16     Temp 04/25/18 0308 98.1 F (36.7 C)     Temp Source 04/25/18 0308 Oral     SpO2 04/25/18 0308 93 %     Weight 04/25/18 0309 224 lb (101.6 kg)     Height 04/25/18 0309 5\' 6"  (1.676 m)     Head Circumference --      Peak Flow --      Pain Score 04/25/18 0308 8     Pain Loc --      Pain Edu? --      Excl. in [place]? --     Constitutional: Alert and oriented.  Answers questions appropriately.  Chronically ill-appearing. Eyes: Conjunctivae are normal.  EOMI. No scleral icterus.  No eye discharge. Head: Atraumatic. Nose: No congestion/rhinnorhea. Mouth/Throat: Mucous membranes are moist.  Neck: No stridor.  Supple.  Positive mild JVD. Cardiovascular: Normal rate, regular rhythm. No murmurs, rubs or gallops.  Respiratory: Prolonged expiratory phase.  Patient is able to speak in full sentences without difficulty.  No accessory muscle use or retractions. Lungs CTAB.  No wheezes, rales or ronchi. Gastrointestinal: Obese.  Soft, nontender and nondistended.  No guarding or rebound.  No peritoneal signs. Musculoskeletal: No LE edema. No ttp in the calves or palpable cords.  Negative Homan's sign. Neurologic:  A&Ox3.  Speech is clear.  Face and smile are symmetric.  EOMI.  Moves all extremities  well. Skin:  Skin is warm, dry and intact. No rash noted. Psychiatric: Mood and affect are normal. Speech and behavior are normal.  Normal judgement.  ____________________________________________   LABS (all labs ordered are listed, but only abnormal results are displayed)  Labs Reviewed  COMPREHENSIVE METABOLIC PANEL - Abnormal; Notable for the following components:      Result Value   Glucose, Bld 105 (*)    AST 14 (*)    All other components within normal limits  CBC WITH DIFFERENTIAL/PLATELET - Abnormal; Notable for the following components:   WBC 10.7 (*)    All other components within normal limits  URINALYSIS, COMPLETE (UACMP) WITH MICROSCOPIC - Abnormal; Notable for the following components:   Color, Urine STRAW (*)    APPearance HAZY (*)    Bacteria, UA RARE (*)  All other components within normal limits  BRAIN NATRIURETIC PEPTIDE - Abnormal; Notable for the following components:   B Natriuretic Peptide 111.0 (*)    All other components within normal limits  URINE CULTURE  TROPONIN I  INFLUENZA PANEL BY PCR (TYPE A & B)  LACTIC ACID, PLASMA  PROCALCITONIN   ____________________________________________  EKG  ED ECG REPORT I, Anne-Caroline Mariea Clonts, the attending physician, personally viewed and interpreted this ECG.   Date: 04/25/2018  EKG Time: 318  Rate: 67  Rhythm: normal sinus rhythm  Axis: leftward  Intervals:none  ST&T Change: no STEMI  ____________________________________________  RADIOLOGY  Ct Head Wo Contrast  Result Date: 04/25/2018 CLINICAL DATA:  Acute headache with normal neuro exam EXAM: CT HEAD WITHOUT CONTRAST TECHNIQUE: Contiguous axial images were obtained from the base of the skull through the vertex without intravenous contrast. COMPARISON:  02/16/2015 FINDINGS: Brain: No evidence of acute infarction, hemorrhage, hydrocephalus, extra-axial collection or mass lesion/mass effect. Heterogeneous deep gray nuclei, stable from prior.Mild  presumed chronic small vessel ischemic change in the cerebral white matter. Vascular: Atherosclerotic calcification Skull: Negative Sinuses/Orbits: Negative. IMPRESSION: No acute finding. Mild chronic small vessel ischemia. Electronically Signed   By: Monte Fantasia M.D.   On: 04/25/2018 04:18   Dg Chest Portable 1 View  Result Date: 04/25/2018 CLINICAL DATA:  Cough and shortness of breath EXAM: PORTABLE CHEST 1 VIEW COMPARISON:  03/25/2018 FINDINGS: Chronic cardiomegaly and vascular pedicle widening. Patchy increased density from callus associated with remote rib fractures. There is no edema, consolidation, effusion, or pneumothorax. IMPRESSION: 1. No acute finding when compared to prior. 2. Cardiomegaly. Electronically Signed   By: Monte Fantasia M.D.   On: 04/25/2018 04:11    ____________________________________________   PROCEDURES  Procedure(s) performed: None  Procedures  Critical Care performed: No ____________________________________________   INITIAL IMPRESSION / ASSESSMENT AND PLAN / ED COURSE  Pertinent labs & imaging results that were available during my care of the patient were reviewed by me and considered in my medical decision making (see chart for details).  53 y.o. female with multiple chronic pulmonary and cardiac comorbidities presenting with several days of productive cough, and shortness of breath, nausea vomiting and diarrhea, and headache.  Overall, the patient is hypertensive and I have ordered metoprolol as well as Tylenol to help her headache his pain may be driving her blood pressure up.  Today, she has no neurologic deficits and a subarachnoid hemorrhage or intracranial mass is less likely but a CT scan has been ordered.  We also get a VBG, lactic acid and procalcitonin, as well as chest x-ray, to evaluate for pneumonia.  Bacterial infection is possible.  Influenza or viral syndrome including coronavirus is also possible.  Plan reevaluation for final  disposition.  ----------------------------------------- 4:52 AM on 04/25/2018 -----------------------------------------  She does have bacteriuria and a urine culture has been sent.  Given the current pandemic, we will treat her with Keflex and she has received her first dose here.  The patient CT head has been reassuring and her headache has resolved now that her blood pressure has been controlled.  He continues to have no neurologic deficits.  The patient's work-up for other infection has been reassuring.  Her lactic acid and procalcitonin have been normal.  White blood cell count is 10.7 and her absolute lymphocyte count is normal at 2.1.  Her LFTs are not elevated.  He does not have evidence of a pneumonia on her chest x-ray.  Coronavirus is still on the differential, and  I have asked the patient to initiate full self-isolation starting today.  Cardiac standpoint, the patient's BNP is 111 and her troponin is negative.  I do not suspect a florid CHF exacerbation today.  This time, the patient is safe for discharge home.  Follow-up instructions as well as return precautions were discussed.  ____________________________________________  FINAL CLINICAL IMPRESSION(S) / ED DIAGNOSES  Final diagnoses:  Nausea vomiting and diarrhea  Cough  Hypertension, unspecified type  Acute nonintractable headache, unspecified headache type  Bacteriuria         NEW MEDICATIONS STARTED DURING THIS VISIT:  New Prescriptions   CEPHALEXIN (KEFLEX) 500 MG CAPSULE    Take 1 capsule (500 mg total) by mouth 4 (four) times daily for 5 days.   ONDANSETRON (ZOFRAN ODT) 4 MG DISINTEGRATING TABLET    Take 1 tablet (4 mg total) by mouth every 8 (eight) hours as needed.      Eula Listen, MD 04/25/18 2336    Eula Listen, MD 04/25/18 [phone]

## 2018-04-25 NOTE — Discharge Instructions (Addendum)
Take a clear liquid diet for the next 24 hours, then advance to a bland diet as tolerated.  You may take Zofran for your nausea and vomiting. Return immediately home, and start self-isolation immediately.  Please have someone pick up your prescription for you and bring it to your doorstep; do not interact with the person who brings it to you. If you develop fever or pain, you may take Tylenol but please avoid Motrin.     Person Under Monitoring Name: [name]  Location: [facility] [address]   Infection Prevention Recommendations for Individuals Confirmed to have, or Being Evaluated for, 2019 Novel Coronavirus (COVID-19) Infection Who Receive Care at Home  Individuals who are confirmed to have, or are being evaluated for, COVID-19 should follow the prevention steps below until a healthcare provider or local or state health department says they can return to normal activities.  Stay home except to get medical care You should restrict activities outside your home, except for getting emergency medical care. Do not go to work, school, or public areas, and do not use public transportation or taxis.  Call ahead before visiting your doctor Before your medical appointment, call the healthcare provider and tell them that you have, or are being evaluated for, COVID-19 infection. This will help the healthcare providers office take steps to keep other people from getting infected. Ask your healthcare provider to call the local or state health department.  Monitor your symptoms Seek prompt medical attention if your illness is worsening (e.g., difficulty breathing). Before going to your medical appointment, call the healthcare provider and tell them that you have, or are being evaluated for, COVID-19 infection. Ask your healthcare provider to call the local or state health department.  Wear a facemask You should wear a facemask that covers your nose and mouth when  you are in the same room with other people and when you visit a healthcare provider. People who live with or visit you should also wear a facemask while they are in the same room with you.  Separate yourself from other people in your home As much as possible, you should stay in a different room from other people in your home. Also, you should use a separate bathroom, if available.  Avoid sharing household items You should not share dishes, drinking glasses, cups, eating utensils, towels, bedding, or other items with other people in your home. After using these items, you should wash them thoroughly with soap and water.  Cover your coughs and sneezes Cover your mouth and nose with a tissue when you cough or sneeze, or you can cough or sneeze into your sleeve. Throw used tissues in a lined trash can, and immediately wash your hands with soap and water for at least 20 seconds or use an alcohol-based hand rub.  Wash your [facility] your hands often and thoroughly with soap and water for at least 20 seconds. You can use an alcohol-based hand sanitizer if soap and water are not available and if your hands are not visibly dirty. Avoid touching your eyes, nose, and mouth with unwashed hands.   Prevention Steps for Caregivers and Household Members of Individuals Confirmed to have, or Being Evaluated for, COVID-19 Infection Being Cared for in the Home  If you live with, or provide care at home for, a person confirmed to have, or being evaluated for, COVID-19 infection please follow these guidelines to prevent infection:  Follow healthcare providers instructions Make  sure that you understand and can help the patient follow any healthcare provider instructions for all care.  Provide for the patients basic needs You should help the patient with basic needs in the home and provide support for getting groceries, prescriptions, and other personal needs.  Monitor the patients symptoms If they  are getting sicker, call his or her medical provider and tell them that the patient has, or is being evaluated for, COVID-19 infection. This will help the healthcare providers office take steps to keep other people from getting infected. Ask the healthcare provider to call the local or state health department.  Limit the number of people who have contact with the patient If possible, have only one caregiver for the patient. Other household members should stay in another home or place of residence. If this is not possible, they should stay in another room, or be separated from the patient as much as possible. Use a separate bathroom, if available. Restrict visitors who do not have an essential need to be in the home.  Keep older adults, very young children, and other sick people away from the patient Keep older adults, very young children, and those who have compromised immune systems or chronic health conditions away from the patient. This includes people with chronic heart, lung, or kidney conditions, diabetes, and cancer.  Ensure good ventilation Make sure that shared spaces in the home have good air flow, such as from an air conditioner or an opened window, weather permitting.  Wash your hands often Wash your hands often and thoroughly with soap and water for at least 20 seconds. You can use an alcohol based hand sanitizer if soap and water are not available and if your hands are not visibly dirty. Avoid touching your eyes, nose, and mouth with unwashed hands. Use disposable paper towels to dry your hands. If not available, use dedicated cloth towels and replace them when they become wet.  Wear a facemask and gloves Wear a disposable facemask at all times in the room and gloves when you touch or have contact with the patients blood, body fluids, and/or secretions or excretions, such as sweat, saliva, sputum, nasal mucus, vomit, urine, or feces.  Ensure the mask fits over your nose and  mouth tightly, and do not touch it during use. Throw out disposable facemasks and gloves after using them. Do not reuse. Wash your hands immediately after removing your facemask and gloves. If your personal clothing becomes contaminated, carefully remove clothing and launder. Wash your hands after handling contaminated clothing. Place all used disposable facemasks, gloves, and other waste in a lined container before disposing them with other household waste. Remove gloves and wash your hands immediately after handling these items.  Do not share dishes, glasses, or other household items with the patient Avoid sharing household items. You should not share dishes, drinking glasses, cups, eating utensils, towels, bedding, or other items with a patient who is confirmed to have, or being evaluated for, COVID-19 infection. After the person uses these items, you should wash them thoroughly with soap and water.  Wash laundry thoroughly Immediately remove and wash clothes or bedding that have blood, body fluids, and/or secretions or excretions, such as sweat, saliva, sputum, nasal mucus, vomit, urine, or feces, on them. Wear gloves when handling laundry from the patient. Read and follow directions on labels of laundry or clothing items and detergent. In general, wash and dry with the warmest temperatures recommended on the label.  Clean all areas the individual  has used often Clean all touchable surfaces, such as counters, tabletops, doorknobs, bathroom fixtures, toilets, phones, keyboards, tablets, and bedside tables, every day. Also, clean any surfaces that may have blood, body fluids, and/or secretions or excretions on them. Wear gloves when cleaning surfaces the patient has come in contact with. Use a diluted bleach solution (e.g., dilute bleach with 1 part bleach and 10 parts water) or a household disinfectant with a label that says EPA-registered for coronaviruses. To make a bleach solution at home,  add 1 tablespoon of bleach to 1 quart (4 cups) of water. For a larger supply, add  cup of bleach to 1 gallon (16 cups) of water. Read labels of cleaning products and follow recommendations provided on product labels. Labels contain instructions for safe and effective use of the cleaning product including precautions you should take when applying the product, such as wearing gloves or eye protection and making sure you have good ventilation during use of the product. Remove gloves and wash hands immediately after cleaning.  Monitor yourself for signs and symptoms of illness Caregivers and household members are considered close contacts, should monitor their health, and will be asked to limit movement outside of the home to the extent possible. Follow the monitoring steps for close contacts listed on the symptom monitoring form.   ? If you have additional questions, contact your local health department or call the epidemiologist on call at [phone] (available 24/7). ? This guidance is subject to change. For the most up-to-date guidance from [facility], please refer to their website: YouBlogs.pl

## 2018-04-25 NOTE — ED Notes (Signed)
Waiting in room until ride arrives

## 2018-04-25 NOTE — ED Triage Notes (Signed)
Pt to ED via ems from home. Pt c/o N/V/D and headache. Pt states symptoms started 12 hours ago, pt arrives stating she is short of breath new today. Hx COPD.

## 2018-04-25 NOTE — ED Notes (Signed)
Left with sister

## 2018-04-25 NOTE — ED Notes (Signed)
Pt states that her sister is not able to pick up her up until the morning. Pt states that she is okay with being discharged to the lobby, but MD Mariea Clonts stated that she would rather the patient stay in the room until being discharged. Charge nurse notified and okayed blocking off room until discharge. Pt okayed plan and would call out when ride appeared to pick up patient.

## 2018-04-26 LAB — URINE CULTURE

## 2018-05-01 ENCOUNTER — Telehealth: Payer: Self-pay | Admitting: Family Medicine

## 2018-05-01 NOTE — Telephone Encounter (Signed)
Called pt in regards to appt tomorrow no answer left voicemail

## 2018-05-02 ENCOUNTER — Ambulatory Visit: Payer: Medicaid Other | Admitting: Family Medicine

## 2018-05-17 ENCOUNTER — Other Ambulatory Visit: Payer: Self-pay | Admitting: Family Medicine

## 2018-05-17 NOTE — Telephone Encounter (Signed)
Requested medication (s) are due for refill today: yes  Requested medication (s) are on the active medication list: no  Last refill:  10/13/17  Future visit scheduled: no  Notes to clinic:  Med was dc 'd  03/25/18 "pt not taking"   Requested Prescriptions  Pending Prescriptions Disp Refills   sertraline (ZOLOFT) 100 MG tablet [Pharmacy Med Name: SERTRALINE HCL 100 MG TABLET] 30 tablet 1    Sig: TAKE 1 TABLET BY MOUTH EVERY DAY     Psychiatry:  Antidepressants - SSRI Passed - 05/17/2018  2:17 PM      Passed - Valid encounter within last 6 months    Recent Outpatient Visits          1 month ago Chest pain, unspecified type   [facility] Volney American, [place]   2 months ago Right foot pain   [place], [place], [place]   6 months ago Essential hypertension   [place], [place], [place]   11 months ago Sleep apnea, unspecified type   [facility] [name], NP   1 year ago Essential hypertension   [facility] Merrie Roof [place], [place]

## 2018-06-12 ENCOUNTER — Telehealth: Payer: Self-pay | Admitting: Family Medicine

## 2018-06-12 NOTE — Telephone Encounter (Signed)
Spoke with patient. She is going to see how she is doing in the morning.  If she is no better tomorrow she plans to go to UC or call back to schedule with Apolonio Schneiders

## 2018-06-12 NOTE — Telephone Encounter (Signed)
OK to schedule in office as soon as she can, she can see a different provider if needed  Copied from [place] [id number]. Topic: Appointment Scheduling - Scheduling Inquiry for Clinic >> Jun 12, 2018 12:23 PM Richardo Priest, [place] wrote: Reason for CRM: Patient is calling in requesting an appointment be made for her. States she has been having left leg pain for the past week and notice, while bathing, she has a knot in between her buttock cheeks. Is requesting a call back to see what can be done [phone]. >> Jun 12, 2018  1:22 PM Shaune Pollack wrote: Patient is complaining of numbness also.  I was going to schedule her  in office visit on Thurs but was not sure if she should wait that long.  Please advise.  Thank you

## 2018-06-21 ENCOUNTER — Telehealth: Payer: Self-pay

## 2018-06-21 ENCOUNTER — Telehealth: Payer: Self-pay | Admitting: Family Medicine

## 2018-06-21 ENCOUNTER — Other Ambulatory Visit: Payer: Self-pay | Admitting: Family Medicine

## 2018-06-21 MED ORDER — COLCHICINE 0.6 MG PO TABS: 0.6000 mg | ORAL_TABLET | Freq: Two times a day (BID) | ORAL | 1 refills | Status: DC

## 2018-06-21 NOTE — Telephone Encounter (Signed)
Copied from [place] [id number]. Topic: Quick Communication - Rx Refill/Question >> Jun 21, 2018  8:02 AM Richardo Priest, [place] wrote: Medication:  gabapentin (NEURONTIN) 300 MG capsule  Has the patient contacted their pharmacy? Yes advised to call office  Preferred Pharmacy (with phone number or street name):  [id number] - [place], [address] [phone] (Phone) [phone] (Fax)  Agent: Please be advised that RX refills may take up to 3 business days. We ask that you follow-up with your pharmacy.

## 2018-06-21 NOTE — Telephone Encounter (Signed)
PA for Colchicine submitted via [employer]. Confirmation number: [id number]

## 2018-06-21 NOTE — Telephone Encounter (Signed)
°  Relation to pt: self Call back number [phone] :   Pharmacy: [id number] - [place], [address] [phone] (Phone) [phone] (Fax)    Reason for call:  Patient called back also requesting colchicine 0.6 MG tablet for gout outbreak, please advise

## 2018-06-21 NOTE — Telephone Encounter (Signed)
Prescription for colchicine  0.6 mg tab expired on 04/19/2018  PCP Merrie Roof

## 2018-06-21 NOTE — Telephone Encounter (Signed)
Rx refilled.

## 2018-06-26 NOTE — Telephone Encounter (Signed)
PA denied.

## 2018-07-03 ENCOUNTER — Encounter: Payer: Self-pay | Admitting: Family

## 2018-07-03 ENCOUNTER — Ambulatory Visit: Payer: Medicaid Other | Attending: Family | Admitting: Family

## 2018-07-03 ENCOUNTER — Other Ambulatory Visit: Payer: Self-pay

## 2018-07-03 VITALS — BP 160/80 | HR 95 | Temp 98.2°F | Resp 18 | Ht 70.0 in | Wt 240.0 lb

## 2018-07-03 DIAGNOSIS — Z880 Allergy status to penicillin: Secondary | ICD-10-CM | POA: Diagnosis not present

## 2018-07-03 DIAGNOSIS — Z885 Allergy status to narcotic agent status: Secondary | ICD-10-CM | POA: Diagnosis not present

## 2018-07-03 DIAGNOSIS — Z87891 Personal history of nicotine dependence: Secondary | ICD-10-CM | POA: Insufficient documentation

## 2018-07-03 DIAGNOSIS — Z7982 Long term (current) use of aspirin: Secondary | ICD-10-CM | POA: Insufficient documentation

## 2018-07-03 DIAGNOSIS — Z803 Family history of malignant neoplasm of breast: Secondary | ICD-10-CM | POA: Insufficient documentation

## 2018-07-03 DIAGNOSIS — E669 Obesity, unspecified: Secondary | ICD-10-CM | POA: Diagnosis not present

## 2018-07-03 DIAGNOSIS — M109 Gout, unspecified: Secondary | ICD-10-CM | POA: Diagnosis not present

## 2018-07-03 DIAGNOSIS — I509 Heart failure, unspecified: Secondary | ICD-10-CM | POA: Diagnosis present

## 2018-07-03 DIAGNOSIS — Z8673 Personal history of transient ischemic attack (TIA), and cerebral infarction without residual deficits: Secondary | ICD-10-CM | POA: Insufficient documentation

## 2018-07-03 DIAGNOSIS — G4733 Obstructive sleep apnea (adult) (pediatric): Secondary | ICD-10-CM | POA: Insufficient documentation

## 2018-07-03 DIAGNOSIS — Z86711 Personal history of pulmonary embolism: Secondary | ICD-10-CM | POA: Diagnosis not present

## 2018-07-03 DIAGNOSIS — G3184 Mild cognitive impairment, so stated: Secondary | ICD-10-CM | POA: Insufficient documentation

## 2018-07-03 DIAGNOSIS — J449 Chronic obstructive pulmonary disease, unspecified: Secondary | ICD-10-CM | POA: Diagnosis not present

## 2018-07-03 DIAGNOSIS — Z79899 Other long term (current) drug therapy: Secondary | ICD-10-CM | POA: Diagnosis not present

## 2018-07-03 DIAGNOSIS — I110 Hypertensive heart disease with heart failure: Secondary | ICD-10-CM | POA: Insufficient documentation

## 2018-07-03 DIAGNOSIS — R441 Visual hallucinations: Secondary | ICD-10-CM | POA: Diagnosis not present

## 2018-07-03 DIAGNOSIS — I5032 Chronic diastolic (congestive) heart failure: Secondary | ICD-10-CM | POA: Insufficient documentation

## 2018-07-03 DIAGNOSIS — E079 Disorder of thyroid, unspecified: Secondary | ICD-10-CM | POA: Insufficient documentation

## 2018-07-03 DIAGNOSIS — Z6834 Body mass index (BMI) 34.0-34.9, adult: Secondary | ICD-10-CM | POA: Insufficient documentation

## 2018-07-03 DIAGNOSIS — I4891 Unspecified atrial fibrillation: Secondary | ICD-10-CM | POA: Diagnosis not present

## 2018-07-03 DIAGNOSIS — I10 Essential (primary) hypertension: Secondary | ICD-10-CM

## 2018-07-03 MED ORDER — TIOTROPIUM BROMIDE MONOHYDRATE 18 MCG IN CAPS: 18.0000 ug | ORAL_CAPSULE | Freq: Every day | RESPIRATORY_TRACT | 5 refills | Status: DC

## 2018-07-03 MED ORDER — ALBUTEROL SULFATE HFA 108 (90 BASE) MCG/ACT IN AERS: 2.0000 | INHALATION_SPRAY | Freq: Four times a day (QID) | RESPIRATORY_TRACT | 5 refills | Status: DC | PRN

## 2018-07-03 NOTE — Progress Notes (Signed)
HPI   [name] is a 53 y/o female with a history of pulmonary embolus, obstructive sleep apnea, atrial fibrillation, COPD, gout, HTN, CVA, remote tobacco use and chronic heart failure.   Echo report from 03/26/2018 reviewed and showed an EF of 60-65% along with mild/ moderate TR. Echo report from 06/17/16 reviewed and showed an EF of 55-65%. Had a pharmacologic stress trest done 09/12/12 and had an estimated EF of 49%.   Catheterization done 03/27/2018 showed normal coronary arteries and normal left ventricular function.  Was in the ED 04/25/2018 due to nausea/ vomiting/ HTN where she was treated and released. Admitted 03/25/2018 due to chest pain. Cardiology consult was obtained. Cath done which was normal and she was discharged after 2 days. Was in the ED 01/26/2018 due to emesis and abdominal pain. CXR showed pneumonia so she was treated with antibiotics and zofran and was released.    She presents today for a follow-up visit with a chief complaint of minimal fatigue upon moderate exertion. She describes this as chronic in nature having been present for several years. She has associated blurry vision, shortness of breath, chest tenderness, palpitations and dizziness along with this. She denies cough, pedal edema, abdominal distention, difficulty sleeping or weight gain. Has been having visual hallucinations that she thinks may be related to sertraline.    Past Medical History:  Diagnosis Date  . A-fib ([place]) 2010  . Adrenal adenoma, left   . Alcohol abuse   . Allergy   . CHF (congestive heart failure) ([place])   . COPD (chronic obstructive pulmonary disease) ([place])   . Fatty liver   . GERD (gastroesophageal reflux disease)   . Gout   . Hypertension   . Mild cognitive impairment   . Non-healing non-surgical wound    right leg  . Obesity   . Pulmonary embolus ([place])   . Sleep apnea    uses Cpap  . Stroke ([place])   . Thyroid disease    Past Surgical History:  Procedure Laterality Date  . bilateral  wrist fractures  2010  . LEFT HEART CATH AND CORONARY ANGIOGRAPHY N/A 03/27/2018   Procedure: LEFT HEART CATH AND CORONARY ANGIOGRAPHY;  Surgeon: Isaias Cowman, MD;  Location: [facility];  Service: Cardiovascular;  Laterality: N/A;  . lt ankle fracture  2002   Family History  Problem Relation Age of Onset  . Prostate cancer Father   . Rectal cancer Sister   . Breast cancer Sister   . Cancer Maternal Grandmother   . Cancer Maternal Grandfather    Social History   Tobacco Use  . Smoking status: Former Smoker    Types: Cigarettes    Last attempt to quit: 01/26/2016    Years since quitting: 2.4  . Smokeless tobacco: Never Used  Substance Use Topics  . Alcohol use: Not Currently    Comment: Occassionally   Allergies  Allergen Reactions  . Morphine And Related Hives  . Penicillins Other (See Comments)    Painful urination Has patient had a PCN reaction causing immediate rash, facial/tongue/throat swelling, SOB or lightheadedness with hypotension: yes Has patient had a PCN reaction causing severe rash involving mucus membranes or skin necrosis: no Has patient had a PCN reaction that required hospitalization no Has patient had a PCN reaction occurring within the last 10 years: no If all of the above answers are "NO", then may proceed with Cephalosporin use.    Prior to Admission medications   Medication Sig Start Date End  Date Taking? Authorizing Provider  albuterol (PROVENTIL HFA;VENTOLIN HFA) 108 (90 Base) MCG/ACT inhaler Inhale 2 puffs into the lungs every 6 (six) hours as needed for wheezing or shortness of breath.   Yes [provider]  allopurinol (ZYLOPRIM) 100 MG tablet Take 1 tablet (100 mg total) by mouth daily. 11/08/17  Yes Volney American, PA-C  amLODipine (NORVASC) 10 MG tablet Take 1 tablet (10 mg total) by mouth daily. 04/07/17  Yes Volney American, PA-C  aspirin EC 81 MG tablet Take 81 mg by mouth daily.   Yes [provider]  atorvastatin (LIPITOR) 40 MG tablet Take 1 tablet (40 mg total) by mouth daily. 11/14/17  Yes Volney American, PA-C  cloNIDine (CATAPRES) 0.1 MG tablet Take 1 tablet (0.1 mg total) by mouth 2 (two) times daily. 11/08/17  Yes Volney American, PA-C  colchicine 0.6 MG tablet Take 1 tablet (0.6 mg total) by mouth 2 (two) times daily. As needed for gout flares 06/21/18  Yes Volney American, PA-C  gabapentin (NEURONTIN) 300 MG capsule Take 1 capsule (300 mg total) by mouth at bedtime. 02/17/18  Yes Volney American, PA-C  hydrALAZINE (APRESOLINE) 50 MG tablet TAKE 1 TABLET BY MOUTH THREE TIMES A DAY 11/24/17  Yes Volney American, PA-C  lisinopril (PRINIVIL,ZESTRIL) 20 MG tablet Take 1 tablet (20 mg total) by mouth daily. 03/24/17  Yes Darylene Price A, FNP  metoprolol (TOPROL XL) 200 MG 24 hr tablet Take 1 tablet (200 mg total) by mouth daily. 12/01/17  Yes , Otila Kluver A, FNP  ondansetron (ZOFRAN ODT) 4 MG disintegrating tablet Take 1 tablet (4 mg total) by mouth every 8 (eight) hours as needed. 04/25/18  Yes Eula Listen, MD  pantoprazole (PROTONIX) 40 MG tablet Take 1 tablet (40 mg total) by mouth daily. 11/08/17  Yes Volney American, PA-C  potassium chloride SA (KLOR-CON M20) 20 MEQ tablet Take 1 tablet (20 mEq total) by mouth daily. Patient taking differently: Take 20 mEq by mouth 2 (two) times daily.  03/24/17  Yes Darylene Price A, FNP  sertraline (ZOLOFT) 100 MG tablet TAKE 1 TABLET BY MOUTH EVERY DAY 05/17/18  Yes Volney American, PA-C  furosemide (LASIX) 40 MG tablet Take 1 tablet (40 mg total) by mouth daily. 12/26/17 04/04/18  Alisa Graff, FNP     Review of Systems  Constitutional: Positive for fatigue (with moderate exertion). Negative for appetite change.  HENT: Negative for congestion and postnasal drip.   Eyes: Positive for visual disturbance (blurry vision).  Respiratory: Positive for shortness of breath. Negative for cough and  chest tightness.   Cardiovascular: Positive for chest pain and palpitations. Negative for leg swelling.  Gastrointestinal: Negative for abdominal distention and abdominal pain.  Endocrine: Negative.   Genitourinary: Negative.   Musculoskeletal: Negative for back pain.  Skin: Negative.   Allergic/Immunologic: Negative.   Neurological: Positive for dizziness. Negative for light-headedness.  Hematological: Negative for adenopathy. Does not bruise/bleed easily.  Psychiatric/Behavioral: Negative for dysphoric mood and sleep disturbance (wearing CPAP nightly; sleeping on 2 pillows). The patient is not nervous/anxious.    Vitals:   07/03/18 1148  BP: (!) 160/80  Pulse: 95  Resp: 18  Temp: 98.2 F (36.8 C)  SpO2: 98%  Weight: 240 lb (108.9 kg)  Height: 5\' 10"  (1.778 m)   Wt Readings from Last 3 Encounters:  07/03/18 240 lb (108.9 kg)  04/25/18 224 lb (101.6 kg)  04/04/18 242 lb 12.8 oz (110.1 kg)  Lab Results  Component Value Date   CREATININE 0.59 04/25/2018   CREATININE 0.60 03/27/2018   CREATININE 0.76 03/26/2018    Physical Exam  Constitutional: She is oriented to person, place, and time. She appears well-developed and well-nourished.  HENT:  Head: Normocephalic and atraumatic.  Neck: Normal range of motion. Neck supple. No JVD present.  Cardiovascular: Normal rate and regular rhythm.  Pulmonary/Chest: Effort normal. She has no wheezes. She has no rales.  Abdominal: Soft. She exhibits no distension. There is no abdominal tenderness.  Musculoskeletal:        General: No tenderness or edema.  Neurological: She is alert and oriented to person, place, and time.  Skin: Skin is warm and dry.  Psychiatric: She has a normal mood and affect. Her behavior is normal. Thought content normal.  Nursing note and vitals reviewed.   Assessment & Plan:  1: Chronic heart failure with preserved ejection fraction- - NYHA class II - euvolemic today - not weighing daily. Encouraged to  weigh daily so that she can call for an overnight weight gain of >2 pounds or a weekly weight gain of >5 pounds.  - weight down 1.2 poundsfrom last visit 3 months ago - not adding salt to her food. Reminded to closely follow a 2000mg  sodium diet  - saw cardiology Margarito Courser) 06/18/16; phone number given to her so that she can call and get an appointment scheduled - wearing oxygen at 2L during the day - BNP 04/25/2018 was 111.0  2: HTN- - BP mildly elevated but she says that she hasn't taken her medications yet today - saw PCP Orene Desanctis) 04/04/2018 - reviewed BMP done 04/25/2018 and it showed sodium 144, potassium 3.5, creatinine 0.59 and GFR >60  3: Obstructive sleep apnea- - wearing CPAP nightly  4: Hallucinations- - patient says that she thinks this may be related to her sertraline - says that she is having visual hallucinations at times - encouraged her to follow-up with PCP regarding this  Patient did not bring her medications nor a list. Each medication was verbally reviewed with the patient and she was encouraged to bring the bottles to every visit to confirm accuracy of list.  Return in 3 months or sooner for any questions/problems before then.

## 2018-07-03 NOTE — Patient Instructions (Signed)
Continue weighing daily and call for an overnight weight gain of > 2 pounds or a weekly weight gain of >5 pounds.  Call Dr. Saralyn Pilar office (cardiology) and get an appointment scheduled. Last seen May 2018 [phone]  Call Primary care doctor to get an appointment to discuss medications and hallucinations.

## 2018-07-18 ENCOUNTER — Other Ambulatory Visit: Payer: Self-pay | Admitting: Family Medicine

## 2018-07-18 ENCOUNTER — Other Ambulatory Visit: Payer: Self-pay | Admitting: Family

## 2018-07-18 NOTE — Telephone Encounter (Signed)
Last filled 03/19 please advise

## 2018-08-01 ENCOUNTER — Emergency Department: Payer: Medicaid Other

## 2018-08-01 ENCOUNTER — Encounter: Payer: Self-pay | Admitting: Emergency Medicine

## 2018-08-01 ENCOUNTER — Other Ambulatory Visit: Payer: Self-pay

## 2018-08-01 ENCOUNTER — Emergency Department
Admission: EM | Admit: 2018-08-01 | Discharge: 2018-08-01 | Disposition: A | Discharge status: Home / Self Care | Payer: Medicaid Other | Attending: Student in an Organized Health Care Education/Training Program | Admitting: Student in an Organized Health Care Education/Training Program

## 2018-08-01 ENCOUNTER — Ambulatory Visit: Payer: Self-pay | Admitting: *Deleted

## 2018-08-01 DIAGNOSIS — J449 Chronic obstructive pulmonary disease, unspecified: Secondary | ICD-10-CM | POA: Diagnosis not present

## 2018-08-01 DIAGNOSIS — I5032 Chronic diastolic (congestive) heart failure: Secondary | ICD-10-CM | POA: Diagnosis not present

## 2018-08-01 DIAGNOSIS — Z87891 Personal history of nicotine dependence: Secondary | ICD-10-CM | POA: Insufficient documentation

## 2018-08-01 DIAGNOSIS — I110 Hypertensive heart disease with heart failure: Secondary | ICD-10-CM | POA: Diagnosis not present

## 2018-08-01 DIAGNOSIS — M79661 Pain in right lower leg: Secondary | ICD-10-CM

## 2018-08-01 DIAGNOSIS — Z79899 Other long term (current) drug therapy: Secondary | ICD-10-CM | POA: Diagnosis not present

## 2018-08-01 DIAGNOSIS — Z86718 Personal history of other venous thrombosis and embolism: Secondary | ICD-10-CM | POA: Diagnosis not present

## 2018-08-01 DIAGNOSIS — M25571 Pain in right ankle and joints of right foot: Secondary | ICD-10-CM | POA: Insufficient documentation

## 2018-08-01 DIAGNOSIS — R2240 Localized swelling, mass and lump, unspecified lower limb: Secondary | ICD-10-CM | POA: Diagnosis present

## 2018-08-01 DIAGNOSIS — Z7982 Long term (current) use of aspirin: Secondary | ICD-10-CM | POA: Diagnosis not present

## 2018-08-01 DIAGNOSIS — M79669 Pain in unspecified lower leg: Secondary | ICD-10-CM

## 2018-08-01 LAB — CBC WITH DIFFERENTIAL/PLATELET
Abs Immature Granulocytes: 0.03 10*3/uL (ref 0.00–0.07)
Basophils Absolute: 0 10*3/uL (ref 0.0–0.1)
Basophils Relative: 0 %
Eosinophils Absolute: 0.1 10*3/uL (ref 0.0–0.5)
Eosinophils Relative: 1 %
HCT: 45.2 % (ref 36.0–46.0)
Hemoglobin: 14.1 g/dL (ref 12.0–15.0)
Immature Granulocytes: 0 %
Lymphocytes Relative: 18 %
Lymphs Abs: 1.6 10*3/uL (ref 0.7–4.0)
MCH: 28.4 pg (ref 26.0–34.0)
MCHC: 31.2 g/dL (ref 30.0–36.0)
MCV: 90.9 fL (ref 80.0–100.0)
Monocytes Absolute: 0.7 10*3/uL (ref 0.1–1.0)
Monocytes Relative: 9 %
Neutro Abs: 6.1 10*3/uL (ref 1.7–7.7)
Neutrophils Relative %: 72 %
Platelets: 279 10*3/uL (ref 150–400)
RBC: 4.97 MIL/uL (ref 3.87–5.11)
RDW: 15.7 % — ABNORMAL HIGH (ref 11.5–15.5)
WBC: 8.6 10*3/uL (ref 4.0–10.5)
nRBC: 0 % (ref 0.0–0.2)

## 2018-08-01 LAB — COMPREHENSIVE METABOLIC PANEL
ALT: 12 U/L (ref 0–44)
AST: 20 U/L (ref 15–41)
Albumin: 4 g/dL (ref 3.5–5.0)
Alkaline Phosphatase: 77 U/L (ref 38–126)
Anion gap: 9 (ref 5–15)
BUN: 9 mg/dL (ref 6–20)
CO2: 29 mmol/L (ref 22–32)
Calcium: 9.3 mg/dL (ref 8.9–10.3)
Chloride: 104 mmol/L (ref 98–111)
Creatinine, Ser: 0.7 mg/dL (ref 0.44–1.00)
GFR calc Af Amer: >60 mL/min (ref >60)
GFR calc non Af Amer: >60 mL/min (ref >60)
Glucose, Bld: 102 mg/dL — ABNORMAL HIGH (ref 70–99)
Potassium: 3.4 mmol/L — ABNORMAL LOW (ref 3.5–5.1)
Sodium: 142 mmol/L (ref 135–145)
Total Bilirubin: 1 mg/dL (ref 0.3–1.2)
Total Protein: 8 g/dL (ref 6.5–8.1)

## 2018-08-01 LAB — BRAIN NATRIURETIC PEPTIDE: B Natriuretic Peptide: 106 pg/mL — ABNORMAL HIGH (ref 0.0–100.0)

## 2018-08-01 LAB — URIC ACID: Uric Acid, Serum: 8 mg/dL — ABNORMAL HIGH (ref 2.5–7.1)

## 2018-08-01 MED ORDER — TRAMADOL HCL 50 MG PO TABS
50.0000 mg | ORAL_TABLET | Freq: Once | ORAL | Status: AC
  Administered 2018-08-01: 14:00:00 50 mg via ORAL
  Filled 2018-08-01: qty 1

## 2018-08-01 MED ORDER — CEPHALEXIN 500 MG PO CAPS
500.0000 mg | ORAL_CAPSULE | Freq: Once | ORAL | Status: AC
  Administered 2018-08-01: 19:00:00 500 mg via ORAL
  Filled 2018-08-01: qty 1

## 2018-08-01 MED ORDER — TRAMADOL HCL 50 MG PO TABS: 50.0000 mg | ORAL_TABLET | Freq: Four times a day (QID) | ORAL | 0 refills | Status: DC | PRN

## 2018-08-01 MED ORDER — CEPHALEXIN 500 MG PO CAPS: 500.0000 mg | ORAL_CAPSULE | Freq: Three times a day (TID) | ORAL | 0 refills | Status: DC

## 2018-08-01 NOTE — Telephone Encounter (Signed)
Agree with going to ER 

## 2018-08-01 NOTE — ED Notes (Signed)
Pt to US.

## 2018-08-01 NOTE — ED Provider Notes (Signed)
[facility] Emergency Department Provider Note    First MD Initiated Contact with Patient 08/01/18 1402     (approximate)  I have reviewed the triage vital signs and the nursing notes.   HISTORY  Chief Complaint Foot Pain    HPI [name] is a 53 y.o. female below listed past medical history presents the ER for several weeks progressively worsening leg swelling and right ankle pain.  States that she has a history of gout and has been taking medications for gout without any improvement.  States she also has a remote history of DVT was not on any anticoagulation currently.  States she has chronic shortness of breath and wears 2 L nasal cannula and/or CPAP at night.  Denies any chest pain or pressure right now.  No fevers.  Denies any injury to her legs.  States she is been compliant with her medications.    Past Medical History:  Diagnosis Date   A-fib [facility]) 2010   Adrenal adenoma, left    Alcohol abuse    Allergy    CHF (congestive heart failure) ([facility])    COPD (chronic obstructive pulmonary disease) ([facility])    Fatty liver    GERD (gastroesophageal reflux disease)    Gout    Hypertension    Mild cognitive impairment    Non-healing non-surgical wound    right leg   Obesity    Pulmonary embolus ([facility])    Sleep apnea    uses Cpap   Stroke ([place])    Thyroid disease    Family History  Problem Relation Age of Onset   Prostate cancer Father    Rectal cancer Sister    Breast cancer Sister    Cancer Maternal Grandmother    Cancer Maternal Grandfather    Past Surgical History:  Procedure Laterality Date   bilateral wrist fractures  2010   LEFT HEART CATH AND CORONARY ANGIOGRAPHY N/A 03/27/2018   Procedure: LEFT HEART CATH AND CORONARY ANGIOGRAPHY;  Surgeon: Isaias Cowman, MD;  Location: [facility];  Service: Cardiovascular;  Laterality: N/A;   lt ankle fracture  2002   Patient Active Problem List   Diagnosis Date Noted   Hallucinations, visual 07/03/2018   Unstable angina ([place]) 03/25/2018   Gout 05/09/2017   COPD (chronic obstructive pulmonary disease) ([place]) 04/10/2017   Sleep apnea 04/10/2017   History of pulmonary embolism 04/10/2017   History of thyroid disease 04/10/2017   Hyperlipidemia 12/31/2016   Essential hypertension 05/26/2016   Chronic diastolic heart failure ([place]) 05/26/2016   Hypokalemia 05/26/2016   A-fib ([place]) 01/26/2008      Prior to Admission medications   Medication Sig Start Date End Date Taking? Authorizing Provider  albuterol (VENTOLIN HFA) 108 (90 Base) MCG/ACT inhaler Inhale 2 puffs into the lungs every 6 (six) hours as needed for wheezing or shortness of breath. 07/03/18   Alisa Graff, FNP  allopurinol (ZYLOPRIM) 100 MG tablet Take 1 tablet (100 mg total) by mouth daily. 11/08/17   Volney American, PA-C  amLODipine (NORVASC) 10 MG tablet TAKE 1 TABLET BY MOUTH EVERY DAY 07/19/18   Volney American, PA-C  aspirin EC 81 MG tablet Take 81 mg by mouth daily.    [provider]  atorvastatin (LIPITOR) 40 MG tablet Take 1 tablet (40 mg total) by mouth daily. 11/14/17   Volney American, PA-C  cloNIDine (CATAPRES) 0.1 MG tablet Take 1 tablet (0.1 mg total) by mouth 2 (two) times  daily. 11/08/17   Volney American, PA-C  colchicine 0.6 MG tablet Take 1 tablet (0.6 mg total) by mouth 2 (two) times daily. As needed for gout flares 06/21/18   Volney American, PA-C  furosemide (LASIX) 40 MG tablet Take 1 tablet (40 mg total) by mouth daily. 12/26/17 04/04/18  Alisa Graff, FNP  gabapentin (NEURONTIN) 300 MG capsule Take 1 capsule (300 mg total) by mouth at bedtime. 02/17/18   Volney American, PA-C  hydrALAZINE (APRESOLINE) 50 MG tablet TAKE 1 TABLET BY MOUTH THREE TIMES A DAY 11/24/17   Volney American, PA-C  lisinopril (ZESTRIL) 20 MG tablet TAKE 1 TABLET BY MOUTH EVERY DAY 07/19/18   Darylene Price A,  FNP  metoprolol (TOPROL XL) 200 MG 24 hr tablet Take 1 tablet (200 mg total) by mouth daily. 12/01/17   Alisa Graff, FNP  ondansetron (ZOFRAN ODT) 4 MG disintegrating tablet Take 1 tablet (4 mg total) by mouth every 8 (eight) hours as needed. 04/25/18   Eula Listen, MD  pantoprazole (PROTONIX) 40 MG tablet Take 1 tablet (40 mg total) by mouth daily. 11/08/17   Volney American, PA-C  potassium chloride SA (KLOR-CON M20) 20 MEQ tablet Take 1 tablet (20 mEq total) by mouth 2 (two) times daily. 07/19/18   Alisa Graff, FNP  sertraline (ZOLOFT) 100 MG tablet TAKE 1 TABLET BY MOUTH EVERY DAY 05/17/18   Volney American, PA-C  tiotropium (SPIRIVA HANDIHALER) 18 MCG inhalation capsule Place 1 capsule (18 mcg total) into inhaler and inhale daily. 07/03/18   Alisa Graff, FNP  traMADol (ULTRAM) 50 MG tablet Take 1 tablet (50 mg total) by mouth every 6 (six) hours as needed. 08/01/18 08/01/19  Merlyn Lot, MD    Allergies Morphine and related and Penicillins    Social History Social History   Tobacco Use   Smoking status: Former Smoker    Types: Cigarettes    Quit date: 01/26/2016    Years since quitting: 2.5   Smokeless tobacco: Never Used  Substance Use Topics   Alcohol use: Not Currently    Comment: Occassionally   Drug use: No    Review of Systems Patient denies headaches, rhinorrhea, blurry vision, numbness, shortness of breath, chest pain, edema, cough, abdominal pain, nausea, vomiting, diarrhea, dysuria, fevers, rashes or hallucinations unless otherwise stated above in HPI. ____________________________________________   PHYSICAL EXAM:  VITAL SIGNS: Vitals:   08/01/18 1513  BP: (!) 178/133  Pulse: 62  Resp: 16  Temp: 98.6 F (37 C)  SpO2: 97%    Constitutional: Alert and oriented.  Eyes: Conjunctivae are normal.  Head: Atraumatic. Nose: No congestion/rhinnorhea. Mouth/Throat: Mucous membranes are moist.   Neck: No stridor. Painless ROM.    Cardiovascular: Normal rate, regular rhythm. Grossly normal heart sounds.  Good peripheral circulation. Respiratory: Normal respiratory effort.  No retractions. Lungs CTAB. Gastrointestinal: Soft and nontender. No distention. No abdominal bruits. No CVA tenderness. Genitourinary: deferred Musculoskeletal: 1+ BLE.  No warmth or cellulitis changes.  No effusion or deformity Neurologic:  Normal speech and language. No gross focal neurologic deficits are appreciated. No facial droop Skin:  Skin is warm, dry and intact. No rash noted. Psychiatric: Mood and affect are normal. Speech and behavior are normal.  ____________________________________________   LABS (all labs ordered are listed, but only abnormal results are displayed)  No results found for this or any previous visit (from the past 24 hour(s)). ____________________________________________ ____________________________________________  [id number]  pending ____________________________________________   PROCEDURES  Procedure(s) performed:  Procedures    Critical Care performed: no ____________________________________________   INITIAL IMPRESSION / ASSESSMENT AND PLAN / ED COURSE  Pertinent labs & imaging results that were available during my care of the patient were reviewed by me and considered in my medical decision making (see chart for details).   DDX: DVT, edema, CHF, gout, arthritis  MARTRICE APT is a 53 y.o. who presents to the ED with symptoms as described above.  Patient well-appearing in no acute distress.  Does not seem consistent consistent with infectious process.  Does not seem consistent with gout.  Will order ultrasound to evaluate for any evidence of DVT.  Will evaluate for any evidence of volume overload.  Lab work and imaging still pending at shift change patient will be signed out to oncoming physician to follow-up on labs.  Patient appears well and is not hypoxic therefore assuming no significant  abnormality anticipate that she will be appropriate for outpatient follow-up.    The patient was evaluated in Emergency Department today for the symptoms described in the history of present illness. He/she was evaluated in the context of the global COVID-19 pandemic, which necessitated consideration that the patient might be at risk for infection with the SARS-CoV-2 virus that causes COVID-19. Institutional protocols and algorithms that pertain to the evaluation of patients at risk for COVID-19 are in a state of rapid change based on information released by regulatory bodies including the CDC and federal and state organizations. These policies and algorithms were followed during the patient's care in the ED.   As part of my medical decision making, I reviewed the following data within the [place] notes reviewed and incorporated, Labs reviewed, notes from prior ED visits and [place] Controlled Substance Database   ____________________________________________   FINAL CLINICAL IMPRESSION(S) / ED DIAGNOSES  Final diagnoses:  Pain and swelling of lower leg, unspecified laterality      NEW MEDICATIONS STARTED DURING THIS VISIT:  New Prescriptions   TRAMADOL (ULTRAM) 50 MG TABLET    Take 1 tablet (50 mg total) by mouth every 6 (six) hours as needed.     Note:  This document was prepared using Dragon voice recognition software and may include unintentional dictation errors.    Merlyn Lot, MD 08/01/18 1524

## 2018-08-01 NOTE — Telephone Encounter (Signed)
Pt called with swelling in both her legs which she thinks she has blood clots.  Her legs are swollen from the ankle to the calves. She as a hx of gout and vein stripping. vein stripping was done in 1992. She can not walk. Normally she uses a cane. She wears oxygen at needed and stated a little short of breath. The right ankle is red and both legs hurt. Per protocol, she should be seen in the ED. She is calling 911 and going to [facility]. Routing to [facility] for review.  Reason for Disposition . [1] Can't walk or can barely walk AND [2] new onset  Answer Assessment - Initial Assessment Questions 1. ONSET: "When did the swelling start?" (e.g., minutes, hours, days)     A month ago 2. LOCATION: "What part of the leg is swollen?"  "Are both legs swollen or just one leg?"     Ankles and calves 3. SEVERITY: "How bad is the swelling?" (e.g., localized; mild, moderate, severe)  - Localized - small area of swelling localized to one leg  - MILD pedal edema - swelling limited to foot and ankle, pitting edema < 1/4 inch (6 mm) deep, rest and elevation eliminate most or all swelling  - MODERATE edema - swelling of lower leg to knee, pitting edema > 1/4 inch (6 mm) deep, rest and elevation only partially reduce swelling  - SEVERE edema - swelling extends above knee, facial or hand swelling present     severe 4. REDNESS: "Does the swelling look red or infected?"     Redness on right ankle 5. PAIN: "Is the swelling painful to touch?" If so, ask: "How painful is it?"   (Scale 1-10; mild, moderate or severe)     Pain # pass 10 6. FEVER: "Do you have a fever?" If so, ask: "What is it, how was it measured, and when did it start?"      no 7. CAUSE: "What do you think is causing the leg swelling?"     Veins and gout 8. MEDICAL HISTORY: "Do you have a history of heart failure, kidney disease, liver failure, or cancer?"     CHF 9. RECURRENT SYMPTOM: "Have you had leg swelling before?" If so, ask:  "When was the last time?" "What happened that time?"     Yes and had veins stripping done 10. OTHER SYMPTOMS: "Do you have any other symptoms?" (e.g., chest pain, difficulty breathing)       On Oxygen 2 Liter prn 11. PREGNANCY: "Is there any chance you are pregnant?" "When was your last menstrual period?"       Not pregnant. No periodds  Protocols used: LEG SWELLING AND EDEMA-A-AH

## 2018-08-01 NOTE — Telephone Encounter (Signed)
Forwarding to PCP and provider in office

## 2018-08-01 NOTE — Discharge Instructions (Addendum)
I think you may have a skin infection called cellulitis.  I am going to give you some antibiotics called Keflex 1 pill 3 times a day.  Please follow-up with your regular doctor tomorrow please return if you are worse or if you are no better in 2 days.

## 2018-08-01 NOTE — ED Provider Notes (Addendum)
Patient is taking colchicine and prednisone 20 every day.  Additionally the redness over her right ankle is progressing up the lateral calf.  Is warm and red may be cellulitis.  I will treat her for cellulitis.  She is following up with her doctor tomorrow and a previously scheduled appointment.  Additionally I have told her if she gets worse or does not get any better to come back.  I have drawn a line with a sharpie around the limits of the redness.  I will give the patient Keflex.  Her allergy to penicillin was burning when she urinated after she took penicillin.   Nena Polio, MD 08/01/18 [place] was evaluated in Emergency Department on 08/01/2018 for the symptoms described in the history of present illness. She was evaluated in the context of the global COVID-19 pandemic, which necessitated consideration that the patient might be at risk for infection with the SARS-CoV-2 virus that causes COVID-19. Institutional protocols and algorithms that pertain to the evaluation of patients at risk for COVID-19 are in a state of rapid change based on information released by regulatory bodies including the CDC and federal and state organizations. These policies and algorithms were followed during the patient's care in the ED.   Nena Polio, MD 08/01/18 [phone]

## 2018-08-01 NOTE — ED Triage Notes (Signed)
Patient presents to the ED via EMS from home for bilateral foot pain and swelling.  Patient states she has had pain x 1 month.  Patient reports known blood clots to both feet that she is being treated for by her provider.  Patient denies any new pain or injury today.

## 2018-08-01 NOTE — ED Notes (Signed)
Bilateral foot pain, right more than left. +2 pulses bilaterally, no obvious injury, no swelling noted, bilateral calfs soft

## 2018-08-02 ENCOUNTER — Encounter: Payer: Self-pay | Admitting: Family Medicine

## 2018-08-02 ENCOUNTER — Ambulatory Visit (INDEPENDENT_AMBULATORY_CARE_PROVIDER_SITE_OTHER): Payer: Medicaid Other | Admitting: Family Medicine

## 2018-08-02 ENCOUNTER — Other Ambulatory Visit: Payer: Self-pay

## 2018-08-02 VITALS — BP 150/114 | HR 73 | Temp 98.0°F | Ht 70.0 in | Wt 231.0 lb

## 2018-08-02 DIAGNOSIS — M79604 Pain in right leg: Secondary | ICD-10-CM | POA: Diagnosis not present

## 2018-08-02 DIAGNOSIS — M7989 Other specified soft tissue disorders: Secondary | ICD-10-CM

## 2018-08-02 DIAGNOSIS — I5032 Chronic diastolic (congestive) heart failure: Secondary | ICD-10-CM

## 2018-08-02 DIAGNOSIS — R238 Other skin changes: Secondary | ICD-10-CM

## 2018-08-02 DIAGNOSIS — M1A9XX0 Chronic gout, unspecified, without tophus (tophi): Secondary | ICD-10-CM

## 2018-08-02 MED ORDER — ALLOPURINOL 300 MG PO TABS: 300.0000 mg | ORAL_TABLET | Freq: Every day | ORAL | 6 refills | Status: DC

## 2018-08-02 MED ORDER — SULFAMETHOXAZOLE-TRIMETHOPRIM 800-160 MG PO TABS: 1.0000 | ORAL_TABLET | Freq: Two times a day (BID) | ORAL | 0 refills | Status: DC

## 2018-08-02 NOTE — Progress Notes (Signed)
BP (!) 150/114   Pulse 73   Temp 98 F (36.7 C) (Oral)   Ht 5\' 10"  (1.778 m)   Wt 231 lb (104.8 kg)   SpO2 97%   BMI 33.15 kg/m    Subjective:    Patient ID: [name], female    DOB: 11/24/1965, 53 y.o.   MRN: [id number]  HPI: [name] is a 53 y.o. female  Chief Complaint  Patient presents with  . Leg Pain    bilateral but mainly the right foot. Been occurring for 2-3 weeks. went to ER yesterday  . Medication Refill    colchicine   Patient with hx of DVT, chronic gout, a fib, CHF, neuropathy, and multiple other chronic medical problems presenting today following up on ER visit yesterday for severe right lower leg pain that has been ongoing and worsening for 2-[redacted] weeks along with some swelling and redness. Called yesterday to triage concerned about DVT and was sent to ER. Labs and U/S's performed in ER reviewed personally, u/s reassuring with no evidence of DVT b/l LEs. Labs without evidence of significant infection, elevated uric acid and BNP which is not abnormal for patient. Discharged home with script for keflex in case infection and told to continue home gout medications in case gout flare. Also given tramadol for prn pain relief. States her pain is still significant and the redness has spread about 1 cm from the line the ER doctor drew yesterday on leg. Otherwise, feeling better than yesterday. No fevers, chills, CP, SOB. Does note she was unable to fill her keflex as the pharmacy never received the medication so she has not gotten to start the medication.   Relevant past medical, surgical, family and social history reviewed and updated as indicated. Interim medical history since our last visit reviewed. Allergies and medications reviewed and updated.  Review of Systems  Per HPI unless specifically indicated above     Objective:    BP (!) 150/114   Pulse 73   Temp 98 F (36.7 C) (Oral)   Ht 5\' 10"  (1.778 m)   Wt 231 lb (104.8 kg)   SpO2 97%   BMI 33.15  kg/m   Wt Readings from Last 3 Encounters:  08/04/18 243 lb (110.2 kg)  08/02/18 231 lb (104.8 kg)  07/03/18 240 lb (108.9 kg)    Physical Exam Vitals signs and nursing note reviewed.  Constitutional:      Appearance: Normal appearance. She is not ill-appearing.  HENT:     Head: Atraumatic.  Eyes:     Extraocular Movements: Extraocular movements intact.     Conjunctiva/sclera: Conjunctivae normal.  Neck:     Musculoskeletal: Normal range of motion and neck supple.  Cardiovascular:     Rate and Rhythm: Normal rate and regular rhythm.     Pulses: Normal pulses.     Heart sounds: Normal heart sounds.  Pulmonary:     Effort: Pulmonary effort is normal.     Breath sounds: Normal breath sounds.  Musculoskeletal: Normal range of motion.        General: Swelling (Trace edema mainly surrounding lateral right ankle and extending upward to mid calf, with significant ttp in this region) and tenderness present.  Skin:    General: Skin is warm and dry.     Findings: Erythema (in area of edema right lateral lower leg mainly at ankle and upward toward knee. Redness extending about 1 cm beyond marker line drawn yesterday) present.  Neurological:  Mental Status: She is alert and oriented to person, place, and time.  Psychiatric:        Mood and Affect: Mood normal.        Thought Content: Thought content normal.        Judgment: Judgment normal.     Results for orders placed or performed during the hospital encounter of 08/01/18  CBC with Differential/Platelet  Result Value Ref Range   WBC 8.6 4.0 - 10.5 K/uL   RBC 4.97 3.87 - 5.11 MIL/uL   Hemoglobin 14.1 12.0 - 15.0 g/dL   HCT 45.2 36.0 - 46.0 %   MCV 90.9 80.0 - 100.0 fL   MCH 28.4 26.0 - 34.0 pg   MCHC 31.2 30.0 - 36.0 g/dL   RDW 15.7 (H) 11.5 - 15.5 %   Platelets 279 150 - 400 K/uL   nRBC 0.0 0.0 - 0.2 %   Neutrophils Relative % 72 %   Neutro Abs 6.1 1.7 - 7.7 K/uL   Lymphocytes Relative 18 %   Lymphs Abs 1.6 0.7 - 4.0  K/uL   Monocytes Relative 9 %   Monocytes Absolute 0.7 0.1 - 1.0 K/uL   Eosinophils Relative 1 %   Eosinophils Absolute 0.1 0.0 - 0.5 K/uL   Basophils Relative 0 %   Basophils Absolute 0.0 0.0 - 0.1 K/uL   Immature Granulocytes 0 %   Abs Immature Granulocytes 0.03 0.00 - 0.07 K/uL  Comprehensive metabolic panel  Result Value Ref Range   Sodium 142 135 - 145 mmol/L   Potassium 3.4 (L) 3.5 - 5.1 mmol/L   Chloride 104 98 - 111 mmol/L   CO2 29 22 - 32 mmol/L   Glucose, Bld 102 (H) 70 - 99 mg/dL   BUN 9 6 - 20 mg/dL   Creatinine, Ser 0.70 0.44 - 1.00 mg/dL   Calcium 9.3 8.9 - 10.3 mg/dL   Total Protein 8.0 6.5 - 8.1 g/dL   Albumin 4.0 3.5 - 5.0 g/dL   AST 20 15 - 41 U/L   ALT 12 0 - 44 U/L   Alkaline Phosphatase 77 38 - 126 U/L   Total Bilirubin 1.0 0.3 - 1.2 mg/dL   GFR calc non Af Amer >60 >60 mL/min   GFR calc Af Amer >60 >60 mL/min   Anion gap 9 5 - 15  Brain natriuretic peptide  Result Value Ref Range   B Natriuretic Peptide 106.0 (H) 0.0 - 100.0 pg/mL  Uric acid  Result Value Ref Range   Uric Acid, Serum 8.0 (H) 2.5 - 7.1 mg/dL      Assessment & Plan:   Problem List Items Addressed This Visit      Cardiovascular and Mediastinum   Chronic diastolic heart failure (HCC) - Primary (Chronic)    Not currently wearing compression stockings, discussed daily use and importance of keeping leg swelling to a minimum as she frequently has issues with this. F/u with HF clinic as scheduled and continue per their recommendations        Other   Gout    Taking colchicine daily currently in case current pain sxs related to gout flare. Will also increase allopurinol dose to 300 mg as her uric acid is persistently above goal when checked. Tart cherry supplements, diet modifications reviewed       Other Visit Diagnoses    Right leg pain       Has tramadol prn from ER, continue this and her gabapentin as needed and recheck in  2 days after abx are initiated   Redness and swelling of  lower leg       Cellulitis vs gout, treating for both. Could not start keflex, will send bactrim. Recheck in 2 days. Elevate legs, compression hose,        Follow up plan: Return in about 2 days (around 08/04/2018) for cellulitis f/u.

## 2018-08-04 ENCOUNTER — Ambulatory Visit: Payer: Medicaid Other | Admitting: Family Medicine

## 2018-08-04 ENCOUNTER — Encounter: Payer: Self-pay | Admitting: Family Medicine

## 2018-08-04 ENCOUNTER — Other Ambulatory Visit: Payer: Self-pay

## 2018-08-04 VITALS — BP 116/82 | HR 90 | Temp 97.6°F | Ht 70.0 in | Wt 243.0 lb

## 2018-08-04 DIAGNOSIS — R600 Localized edema: Secondary | ICD-10-CM | POA: Diagnosis not present

## 2018-08-04 DIAGNOSIS — M79605 Pain in left leg: Secondary | ICD-10-CM

## 2018-08-04 NOTE — Progress Notes (Signed)
BP 116/82   Pulse 90   Temp 97.6 F (36.4 C) (Oral)   Ht 5\' 10"  (1.778 m)   Wt 243 lb (110.2 kg)   SpO2 96%   BMI 34.87 kg/m    Subjective:    Patient ID: [name], female    DOB: [birth year], 52 y.o.   MRN: [id number]  HPI: [name] is a 53 y.o. female  Chief Complaint  Patient presents with  . Leg Pain   Patient here today for 2 day right lower leg pain and swelling f/u. Started bactrim and notes redness and pain has not necessarily gotten worse but has not gotten much better. Still taking colchicine and tramadol, notes the tramadol doesn't really help and makes her sleepy. No fevers, chills, sweats, worsening redness. Has been trying to keep leg elevated which does seem to be helping some.   Relevant past medical, surgical, family and social history reviewed and updated as indicated. Interim medical history since our last visit reviewed. Allergies and medications reviewed and updated.  Review of Systems  Per HPI unless specifically indicated above     Objective:    BP 116/82   Pulse 90   Temp 97.6 F (36.4 C) (Oral)   Ht 5\' 10"  (1.778 m)   Wt 243 lb (110.2 kg)   SpO2 96%   BMI 34.87 kg/m   Wt Readings from Last 3 Encounters:  08/04/18 243 lb (110.2 kg)  08/02/18 231 lb (104.8 kg)  07/03/18 240 lb (108.9 kg)    Physical Exam Vitals signs and nursing note reviewed.  Constitutional:      Appearance: Normal appearance. She is not ill-appearing.  HENT:     Head: Atraumatic.  Eyes:     Extraocular Movements: Extraocular movements intact.     Conjunctiva/sclera: Conjunctivae normal.  Neck:     Musculoskeletal: Normal range of motion and neck supple.  Cardiovascular:     Rate and Rhythm: Normal rate.     Heart sounds: Normal heart sounds.  Pulmonary:     Effort: Pulmonary effort is normal.     Breath sounds: Normal breath sounds.  Musculoskeletal: Normal range of motion.        General: Swelling (trace edema left lower leg) and tenderness  present.  Skin:    General: Skin is warm and dry.     Findings: Erythema (dark but improving erythema left lateral lower leg from mid calf down to ankle) present.  Neurological:     Mental Status: She is alert and oriented to person, place, and time.  Psychiatric:        Mood and Affect: Mood normal.        Thought Content: Thought content normal.        Judgment: Judgment normal.     Results for orders placed or performed during the hospital encounter of 08/01/18  CBC with Differential/Platelet  Result Value Ref Range   WBC 8.6 4.0 - 10.5 K/uL   RBC 4.97 3.87 - 5.11 MIL/uL   Hemoglobin 14.1 12.0 - 15.0 g/dL   HCT 45.2 36.0 - 46.0 %   MCV 90.9 80.0 - 100.0 fL   MCH 28.4 26.0 - 34.0 pg   MCHC 31.2 30.0 - 36.0 g/dL   RDW 15.7 (H) 11.5 - 15.5 %   Platelets 279 150 - 400 K/uL   nRBC 0.0 0.0 - 0.2 %   Neutrophils Relative % 72 %   Neutro Abs 6.1 1.7 - 7.7 K/uL  Lymphocytes Relative 18 %   Lymphs Abs 1.6 0.7 - 4.0 K/uL   Monocytes Relative 9 %   Monocytes Absolute 0.7 0.1 - 1.0 K/uL   Eosinophils Relative 1 %   Eosinophils Absolute 0.1 0.0 - 0.5 K/uL   Basophils Relative 0 %   Basophils Absolute 0.0 0.0 - 0.1 K/uL   Immature Granulocytes 0 %   Abs Immature Granulocytes 0.03 0.00 - 0.07 K/uL  Comprehensive metabolic panel  Result Value Ref Range   Sodium 142 135 - 145 mmol/L   Potassium 3.4 (L) 3.5 - 5.1 mmol/L   Chloride 104 98 - 111 mmol/L   CO2 29 22 - 32 mmol/L   Glucose, Bld 102 (H) 70 - 99 mg/dL   BUN 9 6 - 20 mg/dL   Creatinine, Ser 0.70 0.44 - 1.00 mg/dL   Calcium 9.3 8.9 - 10.3 mg/dL   Total Protein 8.0 6.5 - 8.1 g/dL   Albumin 4.0 3.5 - 5.0 g/dL   AST 20 15 - 41 U/L   ALT 12 0 - 44 U/L   Alkaline Phosphatase 77 38 - 126 U/L   Total Bilirubin 1.0 0.3 - 1.2 mg/dL   GFR calc non Af Amer >60 >60 mL/min   GFR calc Af Amer >60 >60 mL/min   Anion gap 9 5 - 15  Brain natriuretic peptide  Result Value Ref Range   B Natriuretic Peptide 106.0 (H) 0.0 - 100.0 pg/mL   Uric acid  Result Value Ref Range   Uric Acid, Serum 8.0 (H) 2.5 - 7.1 mg/dL      Assessment & Plan:   Problem List Items Addressed This Visit    None    Visit Diagnoses    Leg edema, left    -  Primary   Complete abx given persistent pain and redness, continue colchicine for now. Compression stockings, leg elevation. Return precautions given   Pain of left lower extremity       D/c tramadol as it's not beneficial and is sedating for her. Increase gabapentin to 1-3 capsules daily prn       Follow up plan: Return in about 1 week (around 08/11/2018) for leg recheck if not better.

## 2018-08-06 NOTE — Assessment & Plan Note (Signed)
Not currently wearing compression stockings, discussed daily use and importance of keeping leg swelling to a minimum as she frequently has issues with this. F/u with HF clinic as scheduled and continue per their recommendations

## 2018-08-06 NOTE — Assessment & Plan Note (Addendum)
Taking colchicine daily currently in case current pain sxs related to gout flare. Will also increase allopurinol dose to 300 mg as her uric acid is persistently above goal when checked. Tart cherry supplements, diet modifications reviewed

## 2018-08-08 ENCOUNTER — Ambulatory Visit: Payer: Self-pay

## 2018-08-08 NOTE — Telephone Encounter (Signed)
Agree with ER evaluation. Thanks!

## 2018-08-08 NOTE — Telephone Encounter (Signed)
Contacted by patient Antionette Poles support, Gilmore Laroche. She states patient is calling stating that her cellulitis had worsen to the point that she can not stand on her left leg. Kim describes lower leg as swollen and red and now extending into the left foot. Pt describes severe pain Gilmore Laroche has been in contact with Pt Sister who she believes wants her transported to the ER for evaluation. Maudie Mercury will call back if sister decides that she would like evaluation by PCP  Answer Assessment - Initial Assessment Questions 1. REASON FOR CALL or QUESTION: "What is your reason for calling today?" or "How can I best help you?" or "What question do you have that I can help answer?"     Contacted by patient Antionette Poles support, Gilmore Laroche. She states patient is calling stating that her cellulitis had worsen to the point that she can not stand on her left leg. Kim describes lower leg as swollen and red and now extending into the left foot. She has been in contact with Pt Sister who she believes wants her transported to the Er for evaluation. Maudie Mercury will call back if sister decides that she would like evaluation by PCP  Protocols used: INFORMATION ONLY CALL - NO TRIAGE-A-AH

## 2018-08-09 NOTE — Telephone Encounter (Signed)
Unable to reach pt. Did speak with sister so verified that pt did not go to E.R. She refused saying she would wait and see. Sister is going to attempt to reach pt to have her contact us as she has not spoken to her today so she is unsure the status of swelling at this point.

## 2018-08-09 NOTE — Telephone Encounter (Signed)
I don't see ER notes, please call and check in on her and again make recommendation to go to ER if worsening

## 2018-08-09 NOTE — Telephone Encounter (Signed)
Pt called back. Spoke w/ patient. Stated that leg is "terrible, still cannot walk on it". Pt was given providers recommendation of seeking care at E.R. Pt verbalized understanding and stated she would.

## 2018-08-14 ENCOUNTER — Telehealth: Payer: Self-pay | Admitting: Family Medicine

## 2018-08-14 NOTE — Telephone Encounter (Signed)
Pt returning your call.  Pt wanting the pain medication.

## 2018-08-14 NOTE — Telephone Encounter (Signed)
Left message on machine for pt to return call to the office.  

## 2018-08-14 NOTE — Telephone Encounter (Signed)
It looks like she was given oxycodone in the ER, she had told me tramadol didn't help and made her groggy so we had increased her gabapentin in efforts to avoid any controlled pain medication. Please see if that has helped her at all. My other thought aside from maybe needing to increase the gabapentin would be trying a small round of prednisone  Copied from [place] [id number]. Topic: General - Inquiry >> Aug 11, 2018 10:00 AM Virl Axe D wrote: Reason for CRM: Pt stated she is still having pain in her leg and would like to know what next steps are. She went to [facility] yesterday and they told her she did not have what she was diagnosed with. She stated she has not gotten a solid answer and needs something for pain. Would like to speak with Apolonio Schneiders or Corey Skains. Please advise.

## 2018-08-15 NOTE — Telephone Encounter (Signed)
Left message on machine for pt to return call to the office.  

## 2018-08-27 ENCOUNTER — Encounter: Payer: Self-pay | Admitting: Emergency Medicine

## 2018-08-27 ENCOUNTER — Ambulatory Visit
Admission: EM | Admit: 2018-08-27 | Discharge: 2018-08-27 | Disposition: A | Discharge status: Home / Self Care | Payer: Medicaid Other | Attending: Physician Assistant | Admitting: Physician Assistant

## 2018-08-27 ENCOUNTER — Other Ambulatory Visit: Payer: Self-pay

## 2018-08-27 DIAGNOSIS — M25471 Effusion, right ankle: Secondary | ICD-10-CM

## 2018-08-27 DIAGNOSIS — L539 Erythematous condition, unspecified: Secondary | ICD-10-CM | POA: Diagnosis not present

## 2018-08-27 DIAGNOSIS — M25571 Pain in right ankle and joints of right foot: Secondary | ICD-10-CM | POA: Diagnosis not present

## 2018-08-27 DIAGNOSIS — Z8739 Personal history of other diseases of the musculoskeletal system and connective tissue: Secondary | ICD-10-CM

## 2018-08-27 MED ORDER — PREDNISONE 10 MG PO TABS: ORAL_TABLET | ORAL | 0 refills | Status: DC

## 2018-08-27 MED ORDER — KETOROLAC TROMETHAMINE 60 MG/2ML IM SOLN: 30.0000 mg | Freq: Once | INTRAMUSCULAR | Status: DC

## 2018-08-27 MED ORDER — DOXYCYCLINE HYCLATE 100 MG PO CAPS: 100.0000 mg | ORAL_CAPSULE | Freq: Two times a day (BID) | ORAL | 0 refills | Status: AC

## 2018-08-27 NOTE — ED Provider Notes (Signed)
[facility]    CSN: [id number] Arrival date & time: 08/27/18  1154     History   Chief Complaint Chief Complaint  Patient presents with  . Ankle Pain    left    HPI [name] is a 53 y.o. female. Patient presents with 1 month history of left ankle swelling and pain. Also admits to redness that seems to be getting a little worse. She says she has seen her PCP and been to two different ERs. She has had an ultrasound and x-ray which were both normal. She does have a history of gout, but says last flare up was over 3 years ago. Patient has been taking Naproxen and Tylenol and using a post op shoe as well as avoiding weightbearing without any improvement in condition. Denies numbness/weakness/tingling. There have been no injuries to the foot. Pain is moderate and worse with weightbearing so she has been using a wheelchair and not ambulating. She has no other complaints or concerns at this time.  HPI  Past Medical History:  Diagnosis Date  . A-fib ([place]) 2010  . Adrenal adenoma, left   . Alcohol abuse   . Allergy   . CHF (congestive heart failure) ([place])   . COPD (chronic obstructive pulmonary disease) ([place])   . Fatty liver   . GERD (gastroesophageal reflux disease)   . Gout   . Hypertension   . Mild cognitive impairment   . Non-healing non-surgical wound    right leg  . Obesity   . Pulmonary embolus ([place])   . Sleep apnea    uses Cpap  . Stroke ([place])   . Thyroid disease     Patient Active Problem List   Diagnosis Date Noted  . Hallucinations, visual 07/03/2018  . Unstable angina ([place]) 03/25/2018  . Gout 05/09/2017  . COPD (chronic obstructive pulmonary disease) ([place]) 04/10/2017  . Sleep apnea 04/10/2017  . History of pulmonary embolism 04/10/2017  . History of thyroid disease 04/10/2017  . Hyperlipidemia 12/31/2016  . Essential hypertension 05/26/2016  . Chronic diastolic heart failure ([place]) 05/26/2016  . Hypokalemia 05/26/2016  . A-fib ([place])  01/26/2008    Past Surgical History:  Procedure Laterality Date  . bilateral wrist fractures  2010  . LEFT HEART CATH AND CORONARY ANGIOGRAPHY N/A 03/27/2018   Procedure: LEFT HEART CATH AND CORONARY ANGIOGRAPHY;  Surgeon: Isaias Cowman, MD;  Location: [facility];  Service: Cardiovascular;  Laterality: N/A;  . lt ankle fracture  2002    OB History    Gravida  1   Para      Term      Preterm      AB      Living  1     SAB      TAB      Ectopic      Multiple      Live Births               Home Medications    Prior to Admission medications   Medication Sig Start Date End Date Taking? Authorizing Provider  albuterol (VENTOLIN HFA) 108 (90 Base) MCG/ACT inhaler Inhale 2 puffs into the lungs every 6 (six) hours as needed for wheezing or shortness of breath. 07/03/18  Yes Hackney, Otila Kluver A, FNP  allopurinol (ZYLOPRIM) 300 MG tablet Take 1 tablet (300 mg total) by mouth daily. 08/02/18  Yes Volney American, PA-C  amLODipine (NORVASC) 10 MG tablet TAKE 1 TABLET BY MOUTH EVERY  DAY 07/19/18  Yes Volney American, PA-C  aspirin EC 81 MG tablet Take 81 mg by mouth daily.   Yes [provider]  atorvastatin (LIPITOR) 40 MG tablet Take 1 tablet (40 mg total) by mouth daily. 11/14/17  Yes Volney American, PA-C  cloNIDine (CATAPRES) 0.1 MG tablet Take 1 tablet (0.1 mg total) by mouth 2 (two) times daily. 11/08/17  Yes Volney American, PA-C  colchicine 0.6 MG tablet Take 1 tablet (0.6 mg total) by mouth 2 (two) times daily. As needed for gout flares 06/21/18  Yes Volney American, PA-C  furosemide (LASIX) 40 MG tablet Take 1 tablet (40 mg total) by mouth daily. 12/26/17 08/27/18 Yes Hackney, Otila Kluver A, FNP  gabapentin (NEURONTIN) 300 MG capsule Take 1 capsule (300 mg total) by mouth at bedtime. 02/17/18  Yes Volney American, PA-C  hydrALAZINE (APRESOLINE) 50 MG tablet TAKE 1 TABLET BY MOUTH THREE TIMES A DAY 11/24/17  Yes Volney American, PA-C  lisinopril (ZESTRIL) 20 MG tablet TAKE 1 TABLET BY MOUTH EVERY DAY 07/19/18  Yes Darylene Price A, FNP  metoprolol (TOPROL XL) 200 MG 24 hr tablet Take 1 tablet (200 mg total) by mouth daily. 12/01/17  Yes Hackney, Otila Kluver A, FNP  ondansetron (ZOFRAN ODT) 4 MG disintegrating tablet Take 1 tablet (4 mg total) by mouth every 8 (eight) hours as needed. 04/25/18  Yes Eula Listen, MD  pantoprazole (PROTONIX) 40 MG tablet Take 1 tablet (40 mg total) by mouth daily. 11/08/17  Yes Volney American, PA-C  potassium chloride SA (KLOR-CON M20) 20 MEQ tablet Take 1 tablet (20 mEq total) by mouth 2 (two) times daily. 07/19/18  Yes Darylene Price A, FNP  sertraline (ZOLOFT) 100 MG tablet TAKE 1 TABLET BY MOUTH EVERY DAY 05/17/18  Yes Volney American, PA-C  tiotropium (SPIRIVA HANDIHALER) 18 MCG inhalation capsule Place 1 capsule (18 mcg total) into inhaler and inhale daily. 07/03/18  Yes Hackney, Otila Kluver A, FNP  traMADol (ULTRAM) 50 MG tablet Take 1 tablet (50 mg total) by mouth every 6 (six) hours as needed. 08/01/18 08/01/19 Yes Merlyn Lot, MD  doxycycline (VIBRAMYCIN) 100 MG capsule Take 1 capsule (100 mg total) by mouth 2 (two) times daily for 7 days. 08/27/18 09/03/18  Danton Clap, PA-C  predniSONE (DELTASONE) 10 MG tablet Take 4 tabs PO x 3 days, 3 tabs PO x 3 days, 2 tabs PO x 2 days, and 1 tab PO x 2 days. 08/27/18   Laurene Footman B, PA-C  sulfamethoxazole-trimethoprim (BACTRIM DS) 800-160 MG tablet Take 1 tablet by mouth 2 (two) times daily. 08/02/18   Volney American, PA-C    Family History Family History  Problem Relation Age of Onset  . Prostate cancer Father   . Rectal cancer Sister   . Breast cancer Sister   . Cancer Maternal Grandmother   . Cancer Maternal Grandfather     Social History Social History   Tobacco Use  . Smoking status: Former Smoker    Types: Cigarettes    Quit date: 01/26/2016    Years since quitting: 2.5  . Smokeless tobacco: Never  Used  Substance Use Topics  . Alcohol use: Not Currently    Comment: Occassionally  . Drug use: No     Allergies   Morphine and related and Penicillins   Review of Systems Review of Systems  Constitutional: Negative for fatigue and fever.  Gastrointestinal: Negative for nausea and vomiting.  Musculoskeletal: Positive for arthralgias, gait  problem and joint swelling. Negative for myalgias.  Skin: Positive for color change (redness). Negative for pallor and wound.  Neurological: Negative for weakness and numbness.  Hematological: Does not bruise/bleed easily.     Physical Exam Triage Vital Signs ED Triage Vitals  Enc Vitals Group     BP 08/27/18 1209 (!) 152/112     Pulse Rate 08/27/18 1209 77     Resp 08/27/18 1209 18     Temp 08/27/18 1209 98.3 F (36.8 C)     Temp Source 08/27/18 1209 Oral     SpO2 08/27/18 1209 97 %     Weight 08/27/18 1205 243 lb (110.2 kg)     Height 08/27/18 1205 5\' 9"  (1.753 m)     Head Circumference --      Peak Flow --      Pain Score 08/27/18 1205 10     Pain Loc --      Pain Edu? --      Excl. in [place]? --    No data found.  Updated Vital Signs BP (!) 140/100 (BP Location: Left Arm)   Pulse 77   Temp 98.3 F (36.8 C) (Oral)   Resp 18   Ht 5\' 9"  (1.753 m)   Wt 243 lb (110.2 kg)   SpO2 97%   BMI 35.88 kg/m      Physical Exam Vitals signs and nursing note reviewed.  Constitutional:      General: She is not in acute distress.    Appearance: She is obese. She is not ill-appearing.  HENT:     Head: Normocephalic and atraumatic.  Eyes:     General: No scleral icterus. Cardiovascular:     Rate and Rhythm: Normal rate and regular rhythm.     Pulses: Normal pulses.     Heart sounds: No murmur.  Pulmonary:     Effort: Pulmonary effort is normal.     Breath sounds: Normal breath sounds. No wheezing, rhonchi or rales.  Musculoskeletal:     Comments: RIGHT LOWER EXTREMITY: There is diffuse swelling of the right ankle with most  swelling dependent to the lateral ankle. 1+non pitting edema to anterior tibia. Faint erythema of dorsal ankle extending to anterior lower leg,  Mild increased warmth of skin. Mild calf tenderness. Obvious varicose veins present. No lacerations/abrasions/deformities. Reduced ROM of ankle due to pain/guarding. 4/5 strength right ankle, 5/5 left. Normal sensation. NVI  Skin:    General: Skin is warm and dry.     Capillary Refill: Capillary refill takes less than 2 seconds.     Findings: No bruising.  Neurological:     General: No focal deficit present.     Mental Status: She is alert. Mental status is at baseline.     Sensory: No sensory deficit.     Motor: No weakness.     Gait: Gait abnormal (not weightbearing, using wheelchair).  Psychiatric:        Mood and Affect: Mood normal.        Behavior: Behavior normal.        Thought Content: Thought content normal.      UC Treatments / Results  Labs (all labs ordered are listed, but only abnormal results are displayed) Labs Reviewed - No data to display  EKG   Radiology No results found.  Procedures Procedures (including critical care time)  Medications Ordered in UC Medications - No data to display  Initial Impression / Assessment and Plan / UC Course  I  have reviewed the triage vital signs and the nursing notes.  Pertinent labs & imaging results that were available during my care of the patient were reviewed by me and considered in my medical decision making (see chart for details).     Final Clinical Impressions(s) / UC Diagnoses   Final diagnoses:  Right ankle swelling  Acute right ankle pain  History of gout  Erythematous condition     Discharge Instructions     RIGHT ANKLE PAIN/SWELLING:  Your recent x-ray and ultrasound have been negative for fracture and deep vein thrombosis. There is concern that this could be a gout flare up versus cellulitis (bacterial infection) since the redness and swelling are not  improving and seem to be getting worse. Continue with post op shoe. Begin prednisone tomorrow. This will help with inflammation and is great for a gout flare up. Begin antibiotics today to treat for possible soft tissue infection. You do have severe varicose veins which could be causing some of the pain and contributing to the swelling. Wear compression hose and consider following up with vein specialist. If swelling/redness/pain acutely worsen or you have a fever, go immediately to ER for evaluation of severe infection or blood clot. Otherwise make an appointment with your PCP in the next 3 days for follow up and further testing.    ED Prescriptions    Medication Sig Dispense Auth. Provider   predniSONE (DELTASONE) 10 MG tablet Take 4 tabs PO x 3 days, 3 tabs PO x 3 days, 2 tabs PO x 2 days, and 1 tab PO x 2 days. 27 tablet Laurene Footman B, PA-C   doxycycline (VIBRAMYCIN) 100 MG capsule Take 1 capsule (100 mg total) by mouth 2 (two) times daily for 7 days. 20 capsule Danton Clap, PA-C     Controlled Substance Prescriptions [place] Controlled Substance Registry consulted? Yes, I have consulted the [place] Controlled Substances Registry for this patient, and feel the risk/benefit ratio today is favorable for proceeding with this prescription for a controlled substance.   Danton Clap, PA-C 08/27/18 1728

## 2018-08-27 NOTE — ED Triage Notes (Signed)
Pt c/o left ankle pain and swelling. She has been to multiple doctors and had multiple test but no one can tell her what is wrong. Started about 2 months ago.

## 2018-08-27 NOTE — Discharge Instructions (Addendum)
RIGHT ANKLE PAIN/SWELLING:  Your recent x-ray and ultrasound have been negative for fracture and deep vein thrombosis. There is concern that this could be a gout flare up versus cellulitis (bacterial infection) since the redness and swelling are not improving and seem to be getting worse. Continue with post op shoe. Begin prednisone tomorrow. This will help with inflammation and is great for a gout flare up. Begin antibiotics today to treat for possible soft tissue infection. You do have severe varicose veins which could be causing some of the pain and contributing to the swelling. Wear compression hose and consider following up with vein specialist. If swelling/redness/pain acutely worsen or you have a fever, go immediately to ER for evaluation of severe infection or blood clot. Otherwise make an appointment with your PCP in the next 3 days for follow up and further testing.

## 2018-08-30 ENCOUNTER — Telehealth: Payer: Self-pay | Admitting: Family Medicine

## 2018-08-30 NOTE — Telephone Encounter (Signed)
I've not seen her for about a month for her leg pain, will need appt if not getting better. Can be virtual if needed as long as she has video capability  Copied from [place] [phone]. Topic: General - Other >> Aug 30, 2018 12:09 PM Leward Quan A wrote: Reason for CRM: Patient called to ask the Dr if she can prescribe her a pain medication. Per patient she is still in her wheelchair because of the pain and its causing her to have difficulty with walkig so she need a pain medicine to combat this issue. Please call patient at Ph#  [phone] or sister Lennox Pippins Ph# [phone]

## 2018-09-01 ENCOUNTER — Encounter: Payer: Self-pay | Admitting: Family Medicine

## 2018-09-01 NOTE — Telephone Encounter (Signed)
Mailing letter to pt to call back.

## 2018-09-01 NOTE — Telephone Encounter (Signed)
Called pt again to get scheduled, no answer, left vm

## 2018-09-13 ENCOUNTER — Ambulatory Visit: Payer: Medicaid Other | Admitting: Nurse Practitioner

## 2018-09-19 ENCOUNTER — Other Ambulatory Visit: Payer: Self-pay | Admitting: Family Medicine

## 2018-09-28 ENCOUNTER — Other Ambulatory Visit: Payer: Self-pay

## 2018-09-28 ENCOUNTER — Ambulatory Visit (INDEPENDENT_AMBULATORY_CARE_PROVIDER_SITE_OTHER): Payer: Medicaid Other | Admitting: Family Medicine

## 2018-09-28 ENCOUNTER — Encounter: Payer: Self-pay | Admitting: Family Medicine

## 2018-09-28 ENCOUNTER — Telehealth: Payer: Self-pay

## 2018-09-28 VITALS — BP 120/89 | HR 93 | Temp 98.5°F | Ht 70.0 in | Wt 239.0 lb

## 2018-09-28 DIAGNOSIS — M1A9XX0 Chronic gout, unspecified, without tophus (tophi): Secondary | ICD-10-CM

## 2018-09-28 DIAGNOSIS — I83813 Varicose veins of bilateral lower extremities with pain: Secondary | ICD-10-CM | POA: Diagnosis not present

## 2018-09-28 DIAGNOSIS — H9202 Otalgia, left ear: Secondary | ICD-10-CM

## 2018-09-28 MED ORDER — COLCHICINE 0.6 MG PO TABS: 0.6000 mg | ORAL_TABLET | Freq: Two times a day (BID) | ORAL | 1 refills | Status: DC

## 2018-09-28 MED ORDER — GABAPENTIN 300 MG PO CAPS: 300.0000 mg | ORAL_CAPSULE | Freq: Two times a day (BID) | ORAL | 5 refills | Status: DC

## 2018-09-28 MED ORDER — PREDNISONE 10 MG PO TABS: ORAL_TABLET | ORAL | 0 refills | Status: DC

## 2018-09-28 MED ORDER — TRAMADOL HCL 50 MG PO TABS: 50.0000 mg | ORAL_TABLET | Freq: Three times a day (TID) | ORAL | 0 refills | Status: DC | PRN

## 2018-09-28 NOTE — Assessment & Plan Note (Signed)
Flare in left great toe. Refill colcrys, continue allopurinol. Prednisone taper sent along with tramadol for rare prn use for flare. Significant precautions reviewed.

## 2018-09-28 NOTE — Telephone Encounter (Signed)
Prior Authorization initiated via [employer] for [facility] Confirmation #: [id number] Prior Approval [id number]

## 2018-09-28 NOTE — Progress Notes (Signed)
BP 120/89   Pulse 93   Temp 98.5 F (36.9 C) (Oral)   Ht 5\' 10"  (1.778 m)   Wt 239 lb (108.4 kg)   SpO2 97%   BMI 34.29 kg/m    Subjective:    Patient ID: [name], female    DOB: [birth year], 53 y.o.   MRN: [id number]  HPI: [name] is a 53 y.o. female  Chief Complaint  Patient presents with  . Gout    bilateral feet. flare up about a month ago  . Pain    gabapentin, tramadol refill   Pt presents today for painful left great toe for about 2 days now. Red, swollen, significantly painful to the point where she can't walk on it. Hx of gout, currently on allopurinol 300 mg daily. Has been taking tylenol around the clock with minimal relief. Denies fevers, chills, injury.  Left ear was bleeding and hurting the other night, just a little sore now. Does use q tips to clean the ears out. Does not recall digging in her ear prior to bleeding. No fevers, muffled hearing, congestion.   Still having painful varicose veins b/l LEs. Swelling has improved but chronic redness and pain. Does not regularly wear compression hose.   Relevant past medical, surgical, family and social history reviewed and updated as indicated. Interim medical history since our last visit reviewed. Allergies and medications reviewed and updated.  Review of Systems  Per HPI unless specifically indicated above     Objective:    BP 120/89   Pulse 93   Temp 98.5 F (36.9 C) (Oral)   Ht 5\' 10"  (1.778 m)   Wt 239 lb (108.4 kg)   SpO2 97%   BMI 34.29 kg/m   Wt Readings from Last 3 Encounters:  09/28/18 239 lb (108.4 kg)  08/27/18 243 lb (110.2 kg)  08/04/18 243 lb (110.2 kg)    Physical Exam Vitals signs and nursing note reviewed.  Constitutional:      Appearance: Normal appearance. She is not ill-appearing.  HENT:     Head: Atraumatic.     Right Ear: Tympanic membrane normal.     Left Ear: Tympanic membrane normal.     Ears:     Comments: Small amount of dried blood and wax present  left ear canal Eyes:     Extraocular Movements: Extraocular movements intact.     Conjunctiva/sclera: Conjunctivae normal.  Neck:     Musculoskeletal: Normal range of motion and neck supple.  Cardiovascular:     Rate and Rhythm: Normal rate and regular rhythm.     Heart sounds: Normal heart sounds.     Comments: Inflamed varicose veins b/l LEs Pulmonary:     Effort: Pulmonary effort is normal.     Breath sounds: Normal breath sounds.  Musculoskeletal: Normal range of motion.        General: Swelling (left great toe erythematous, edematous and significantly ttp) present.  Skin:    General: Skin is warm and dry.     Findings: Erythema (b/l lower legs near ankles) present.  Neurological:     Mental Status: She is alert and oriented to person, place, and time.  Psychiatric:        Mood and Affect: Mood normal.        Thought Content: Thought content normal.        Judgment: Judgment normal.     Results for orders placed or performed during the hospital encounter of 08/01/18  CBC with Differential/Platelet  Result Value Ref Range   WBC 8.6 4.0 - 10.5 K/uL   RBC 4.97 3.87 - 5.11 MIL/uL   Hemoglobin 14.1 12.0 - 15.0 g/dL   HCT 45.2 36.0 - 46.0 %   MCV 90.9 80.0 - 100.0 fL   MCH 28.4 26.0 - 34.0 pg   MCHC 31.2 30.0 - 36.0 g/dL   RDW 15.7 (H) 11.5 - 15.5 %   Platelets 279 150 - 400 K/uL   nRBC 0.0 0.0 - 0.2 %   Neutrophils Relative % 72 %   Neutro Abs 6.1 1.7 - 7.7 K/uL   Lymphocytes Relative 18 %   Lymphs Abs 1.6 0.7 - 4.0 K/uL   Monocytes Relative 9 %   Monocytes Absolute 0.7 0.1 - 1.0 K/uL   Eosinophils Relative 1 %   Eosinophils Absolute 0.1 0.0 - 0.5 K/uL   Basophils Relative 0 %   Basophils Absolute 0.0 0.0 - 0.1 K/uL   Immature Granulocytes 0 %   Abs Immature Granulocytes 0.03 0.00 - 0.07 K/uL  Comprehensive metabolic panel  Result Value Ref Range   Sodium 142 135 - 145 mmol/L   Potassium 3.4 (L) 3.5 - 5.1 mmol/L   Chloride 104 98 - 111 mmol/L   CO2 29 22 - 32  mmol/L   Glucose, Bld 102 (H) 70 - 99 mg/dL   BUN 9 6 - 20 mg/dL   Creatinine, Ser 0.70 0.44 - 1.00 mg/dL   Calcium 9.3 8.9 - 10.3 mg/dL   Total Protein 8.0 6.5 - 8.1 g/dL   Albumin 4.0 3.5 - 5.0 g/dL   AST 20 15 - 41 U/L   ALT 12 0 - 44 U/L   Alkaline Phosphatase 77 38 - 126 U/L   Total Bilirubin 1.0 0.3 - 1.2 mg/dL   GFR calc non Af Amer >60 >60 mL/min   GFR calc Af Amer >60 >60 mL/min   Anion gap 9 5 - 15  Brain natriuretic peptide  Result Value Ref Range   B Natriuretic Peptide 106.0 (H) 0.0 - 100.0 pg/mL  Uric acid  Result Value Ref Range   Uric Acid, Serum 8.0 (H) 2.5 - 7.1 mg/dL      Assessment & Plan:   Problem List Items Addressed This Visit      Other   Gout - Primary    Flare in left great toe. Refill colcrys, continue allopurinol. Prednisone taper sent along with tramadol for rare prn use for flare. Significant precautions reviewed.        Other Visit Diagnoses    Left ear pain       TM intact, some dried blood present in canal. Suspect trauma either from her CPAP or q tips. Bleeding has stopped   Varicose veins of both lower extremities with pain       Compression stockings daily. Referral to Vascular placed for further evaluation   Relevant Orders   Ambulatory referral to Vascular Surgery       Follow up plan: Return in about 3 months (around 12/28/2018) for 6 month f/u.

## 2018-10-02 ENCOUNTER — Other Ambulatory Visit: Payer: Self-pay | Admitting: Family Medicine

## 2018-10-03 NOTE — Telephone Encounter (Signed)
Will need to be seen if needing more than this one refill. Should be doing much better from her gout flare by now and if she's not she needs to be seen. Will refill this once more

## 2018-10-03 NOTE — Telephone Encounter (Signed)
Called and left a detailed message for patient.  

## 2018-10-08 NOTE — Progress Notes (Deleted)
HPI   Ms Saint is a 53 y/o female with a history of pulmonary embolus, obstructive sleep apnea, atrial fibrillation, COPD, gout, HTN, CVA, remote tobacco use and chronic heart failure.   Echo report from 03/26/2018 reviewed and showed an EF of 60-65% along with mild/ moderate TR. Echo report from 06/17/16 reviewed and showed an EF of 55-65%. Had a pharmacologic stress trest done 09/12/12 and had an estimated EF of 49%.   Catheterization done 03/27/2018 showed normal coronary arteries and normal left ventricular function.  Was in the ED 08/10/2018 and 08/27/2018 for right ankle swelling and pain and was treated and released. Was in the ED 08/01/2018 for right leg cellulitis and was treated and released. Went to the ED 04/25/2018 due to nausea/ vomiting/ HTN where she was treated and released.   She presents today for a follow-up visit with a chief complaint of   Past Medical History:  Diagnosis Date  . A-fib ([place]) 2010  . Adrenal adenoma, left   . Alcohol abuse   . Allergy   . CHF (congestive heart failure) ([place])   . COPD (chronic obstructive pulmonary disease) ([place])   . Fatty liver   . GERD (gastroesophageal reflux disease)   . Gout   . Hypertension   . Mild cognitive impairment   . Non-healing non-surgical wound    right leg  . Obesity   . Pulmonary embolus ([place])   . Sleep apnea    uses Cpap  . Stroke ([place])   . Thyroid disease    Past Surgical History:  Procedure Laterality Date  . bilateral wrist fractures  2010  . LEFT HEART CATH AND CORONARY ANGIOGRAPHY N/A 03/27/2018   Procedure: LEFT HEART CATH AND CORONARY ANGIOGRAPHY;  Surgeon: Isaias Cowman, MD;  Location: [facility];  Service: Cardiovascular;  Laterality: N/A;  . lt ankle fracture  2002   Family History  Problem Relation Age of Onset  . Prostate cancer Father   . Rectal cancer Sister   . Breast cancer Sister   . Cancer Maternal Grandmother   . Cancer Maternal Grandfather    Social History   Tobacco Use   . Smoking status: Former Smoker    Types: Cigarettes    Quit date: 01/26/2016    Years since quitting: 2.7  . Smokeless tobacco: Never Used  Substance Use Topics  . Alcohol use: Not Currently    Comment: Occassionally   Allergies  Allergen Reactions  . Morphine And Related Hives  . Penicillins Other (See Comments)    Painful urination Has patient had a PCN reaction causing immediate rash, facial/tongue/throat swelling, SOB or lightheadedness with hypotension: yes Has patient had a PCN reaction causing severe rash involving mucus membranes or skin necrosis: no Has patient had a PCN reaction that required hospitalization no Has patient had a PCN reaction occurring within the last 10 years: no If all of the above answers are "NO", then may proceed with Cephalosporin use.       Review of Systems  Constitutional: Positive for fatigue (with moderate exertion). Negative for appetite change.  HENT: Negative for congestion and postnasal drip.   Eyes: Positive for visual disturbance (blurry vision).  Respiratory: Positive for shortness of breath. Negative for cough and chest tightness.   Cardiovascular: Positive for chest pain and palpitations. Negative for leg swelling.  Gastrointestinal: Negative for abdominal distention and abdominal pain.  Endocrine: Negative.   Genitourinary: Negative.   Musculoskeletal: Negative for back pain.  Skin: Negative.  Allergic/Immunologic: Negative.   Neurological: Positive for dizziness. Negative for light-headedness.  Hematological: Negative for adenopathy. Does not bruise/bleed easily.  Psychiatric/Behavioral: Negative for dysphoric mood and sleep disturbance (wearing CPAP nightly; sleeping on 2 pillows). The patient is not nervous/anxious.     Physical Exam  Constitutional: She is oriented to person, place, and time. She appears well-developed and well-nourished.  HENT:  Head: Normocephalic and atraumatic.  Neck: Normal range of motion. Neck  supple. No JVD present.  Cardiovascular: Normal rate and regular rhythm.  Pulmonary/Chest: Effort normal. She has no wheezes. She has no rales.  Abdominal: Soft. She exhibits no distension. There is no abdominal tenderness.  Musculoskeletal:        General: No tenderness or edema.  Neurological: She is alert and oriented to person, place, and time.  Skin: Skin is warm and dry.  Psychiatric: She has a normal mood and affect. Her behavior is normal. Thought content normal.  Nursing note and vitals reviewed.   Assessment & Plan:  1: Chronic heart failure with preserved ejection fraction- - NYHA class II - euvolemic today - not weighing daily. Encouraged to weigh daily so that she can call for an overnight weight gain of >2 pounds or a weekly weight gain of >5 pounds.  - weight down 1.2 poundsfrom last visit 3 months ago - not adding salt to her food. Reminded to closely follow a 2000mg  sodium diet  - saw cardiology Margarito Courser) 06/18/16; phone number given to her so that she can call and get an appointment scheduled - wearing oxygen at 2L during the day - BNP 08/01/2018 was 106.0  2: HTN- - BP mildly elevated but she says that she hasn't taken her medications yet today - saw PCP Orene Desanctis) 09/28/2018 - reviewed BMP done 08/10/2018 and it showed sodium 141, potassium 4.0, creatinine 0.73 and GFR >90  3: Obstructive sleep apnea- - wearing CPAP nightly  4: Hallucinations- - patient says that she thinks this may be related to her sertraline - says that she is having visual hallucinations at times - encouraged her to follow-up with PCP regarding this  Patient did not bring her medications nor a list. Each medication was verbally reviewed with the patient and she was encouraged to bring the bottles to every visit to confirm accuracy of list.  Return in 3 months or sooner for any questions/problems before then.

## 2018-10-09 ENCOUNTER — Ambulatory Visit: Payer: Medicaid Other | Admitting: Family

## 2018-10-09 ENCOUNTER — Telehealth: Payer: Self-pay | Admitting: Family

## 2018-10-09 NOTE — Telephone Encounter (Signed)
Patient did not show for her [facility] appointment on 10/09/2018. Will attempt to reschedule.

## 2018-10-13 ENCOUNTER — Telehealth: Payer: Self-pay | Admitting: Family Medicine

## 2018-10-13 NOTE — Telephone Encounter (Signed)
If she is still having a flare up 3 weeks after her visit she really needs to be seen. Has been given tramadol and oxycodone the past month or so already.

## 2018-10-13 NOTE — Telephone Encounter (Signed)
Called pt scheduled appt for Monday 10/16/2018

## 2018-10-13 NOTE — Telephone Encounter (Signed)
Pt called and stated that she would like something called in for pain. Pt states that she was prescribed oxycodone 5mg  on 08/10/18. Pt would like to know if you could send in a higher dose and quantity. Pt states that gout is very painful. Please advise

## 2018-10-16 ENCOUNTER — Other Ambulatory Visit: Payer: Self-pay

## 2018-10-16 ENCOUNTER — Encounter: Payer: Self-pay | Admitting: Family Medicine

## 2018-10-16 ENCOUNTER — Ambulatory Visit: Payer: Medicaid Other | Admitting: Family Medicine

## 2018-10-16 VITALS — BP 130/92 | HR 94 | Temp 97.8°F | Ht 70.0 in | Wt 244.0 lb

## 2018-10-16 DIAGNOSIS — M79604 Pain in right leg: Secondary | ICD-10-CM

## 2018-10-16 DIAGNOSIS — M1A9XX0 Chronic gout, unspecified, without tophus (tophi): Secondary | ICD-10-CM | POA: Diagnosis not present

## 2018-10-16 MED ORDER — TRAMADOL HCL 50 MG PO TABS: 50.0000 mg | ORAL_TABLET | Freq: Three times a day (TID) | ORAL | 0 refills | Status: DC | PRN

## 2018-10-16 NOTE — Assessment & Plan Note (Signed)
Recheck labs, may need to adjust regimen given frequency of flares and pain sxs. Diet modifications and supplements reviewed

## 2018-10-16 NOTE — Progress Notes (Signed)
BP (!) 130/92 (BP Location: Left Arm, Patient Position: Sitting, Cuff Size: Normal)   Pulse 94   Temp 97.8 F (36.6 C) (Oral)   Ht 5\' 10"  (1.778 m)   Wt 244 lb (110.7 kg)   SpO2 97%   BMI 35.01 kg/m    Subjective:    Patient ID: [name], female    DOB: February 13, 1965, 53 y.o.   MRN: [id number]  HPI: [name] is a 53 y.o. female  Chief Complaint  Patient presents with  . Gout    Both legs down to ankle and foot.    Patient presenting for persistent b/l leg pain, worst in right ankle area to the point where bending her ankle is nearly impossible. Has been having frequent gout flares lately but also issues with ongoing lower extremity redness and at times edema. Seeing vascular in 2 weeks for consultation of this. Wearing an ankle brace and taking allopurinol and naproxen daily. Could not afford colchicine. Taking tramadol prn for severe pain, states it does take the edge off and help her sleep. Denies fevers, chills, wounds, CP, SOB.   Relevant past medical, surgical, family and social history reviewed and updated as indicated. Interim medical history since our last visit reviewed. Allergies and medications reviewed and updated.  Review of Systems  Per HPI unless specifically indicated above     Objective:    BP (!) 130/92 (BP Location: Left Arm, Patient Position: Sitting, Cuff Size: Normal)   Pulse 94   Temp 97.8 F (36.6 C) (Oral)   Ht 5\' 10"  (1.778 m)   Wt 244 lb (110.7 kg)   SpO2 97%   BMI 35.01 kg/m   Wt Readings from Last 3 Encounters:  10/16/18 244 lb (110.7 kg)  09/28/18 239 lb (108.4 kg)  08/27/18 243 lb (110.2 kg)    Physical Exam Vitals signs and nursing note reviewed.  Constitutional:      Appearance: Normal appearance. She is not ill-appearing.  HENT:     Head: Atraumatic.  Eyes:     Extraocular Movements: Extraocular movements intact.     Conjunctiva/sclera: Conjunctivae normal.  Neck:     Musculoskeletal: Normal range of motion and  neck supple.  Cardiovascular:     Rate and Rhythm: Normal rate and regular rhythm.     Heart sounds: Normal heart sounds.  Pulmonary:     Effort: Pulmonary effort is normal.     Breath sounds: Normal breath sounds.  Musculoskeletal:        General: Tenderness (right lower leg near ankle, some mild redness in area) present. No swelling.  Skin:    General: Skin is warm and dry.     Findings: Erythema present.  Neurological:     Mental Status: She is alert and oriented to person, place, and time.  Psychiatric:        Mood and Affect: Mood normal.        Thought Content: Thought content normal.        Judgment: Judgment normal.     Results for orders placed or performed during the hospital encounter of 08/01/18  CBC with Differential/Platelet  Result Value Ref Range   WBC 8.6 4.0 - 10.5 K/uL   RBC 4.97 3.87 - 5.11 MIL/uL   Hemoglobin 14.1 12.0 - 15.0 g/dL   HCT 45.2 36.0 - 46.0 %   MCV 90.9 80.0 - 100.0 fL   MCH 28.4 26.0 - 34.0 pg   MCHC 31.2 30.0 - 36.0  g/dL   RDW 15.7 (H) 11.5 - 15.5 %   Platelets 279 150 - 400 K/uL   nRBC 0.0 0.0 - 0.2 %   Neutrophils Relative % 72 %   Neutro Abs 6.1 1.7 - 7.7 K/uL   Lymphocytes Relative 18 %   Lymphs Abs 1.6 0.7 - 4.0 K/uL   Monocytes Relative 9 %   Monocytes Absolute 0.7 0.1 - 1.0 K/uL   Eosinophils Relative 1 %   Eosinophils Absolute 0.1 0.0 - 0.5 K/uL   Basophils Relative 0 %   Basophils Absolute 0.0 0.0 - 0.1 K/uL   Immature Granulocytes 0 %   Abs Immature Granulocytes 0.03 0.00 - 0.07 K/uL  Comprehensive metabolic panel  Result Value Ref Range   Sodium 142 135 - 145 mmol/L   Potassium 3.4 (L) 3.5 - 5.1 mmol/L   Chloride 104 98 - 111 mmol/L   CO2 29 22 - 32 mmol/L   Glucose, Bld 102 (H) 70 - 99 mg/dL   BUN 9 6 - 20 mg/dL   Creatinine, Ser 0.70 0.44 - 1.00 mg/dL   Calcium 9.3 8.9 - 10.3 mg/dL   Total Protein 8.0 6.5 - 8.1 g/dL   Albumin 4.0 3.5 - 5.0 g/dL   AST 20 15 - 41 U/L   ALT 12 0 - 44 U/L   Alkaline Phosphatase  77 38 - 126 U/L   Total Bilirubin 1.0 0.3 - 1.2 mg/dL   GFR calc non Af Amer >60 >60 mL/min   GFR calc Af Amer >60 >60 mL/min   Anion gap 9 5 - 15  Brain natriuretic peptide  Result Value Ref Range   B Natriuretic Peptide 106.0 (H) 0.0 - 100.0 pg/mL  Uric acid  Result Value Ref Range   Uric Acid, Serum 8.0 (H) 2.5 - 7.1 mg/dL      Assessment & Plan:   Problem List Items Addressed This Visit      Other   Gout    Recheck labs, may need to adjust regimen given frequency of flares and pain sxs. Diet modifications and supplements reviewed      Relevant Orders   Basic metabolic panel   Uric acid    Other Visit Diagnoses    Pain of right lower extremity    -  Primary   Will recheck labs, await vascular consultation, and continue present medications in meantime. Refill tramadol for rare prn use for severe pain.    Relevant Orders   Basic metabolic panel   Uric acid       Follow up plan: Return for as scheduled.

## 2018-10-17 LAB — BASIC METABOLIC PANEL
BUN/Creatinine Ratio: 18 (ref 9–23)
BUN: 17 mg/dL (ref 6–24)
CO2: 24 mmol/L (ref 20–29)
Calcium: 9.5 mg/dL (ref 8.7–10.2)
Chloride: 105 mmol/L (ref 96–106)
Creatinine, Ser: 0.94 mg/dL (ref 0.57–1.00)
GFR calc Af Amer: 81 mL/min/{1.73_m2} (ref >59)
GFR calc non Af Amer: 70 mL/min/{1.73_m2} (ref >59)
Glucose: 85 mg/dL (ref 65–99)
Potassium: 3.9 mmol/L (ref 3.5–5.2)
Sodium: 145 mmol/L — ABNORMAL HIGH (ref 134–144)

## 2018-10-17 LAB — URIC ACID: Uric Acid: 6.7 mg/dL (ref 2.5–7.1)

## 2018-10-27 ENCOUNTER — Other Ambulatory Visit: Payer: Self-pay

## 2018-10-27 ENCOUNTER — Telehealth: Payer: Self-pay | Admitting: Family Medicine

## 2018-10-27 ENCOUNTER — Ambulatory Visit (INDEPENDENT_AMBULATORY_CARE_PROVIDER_SITE_OTHER): Payer: Medicaid Other | Admitting: Nurse Practitioner

## 2018-10-27 ENCOUNTER — Encounter (INDEPENDENT_AMBULATORY_CARE_PROVIDER_SITE_OTHER): Payer: Self-pay | Admitting: Nurse Practitioner

## 2018-10-27 VITALS — BP 193/139 | HR 77 | Resp 17 | Ht 70.0 in | Wt 253.0 lb

## 2018-10-27 DIAGNOSIS — M79604 Pain in right leg: Secondary | ICD-10-CM

## 2018-10-27 DIAGNOSIS — M79605 Pain in left leg: Secondary | ICD-10-CM | POA: Diagnosis not present

## 2018-10-27 DIAGNOSIS — E785 Hyperlipidemia, unspecified: Secondary | ICD-10-CM | POA: Diagnosis not present

## 2018-10-27 DIAGNOSIS — I10 Essential (primary) hypertension: Secondary | ICD-10-CM | POA: Diagnosis not present

## 2018-10-27 NOTE — Telephone Encounter (Signed)
Pt is calling and tramadol is not working. Pt saw vein specialist today and pt would like percocet . Pt has another appt with vein specialist on 11-15-2018. [facility]. Pt is having leg pain

## 2018-10-30 NOTE — Telephone Encounter (Signed)
I am comfortable increasing her gabapentin if she would like me to do that.

## 2018-10-30 NOTE — Telephone Encounter (Signed)
LVM for patient to return phone call.  

## 2018-10-31 ENCOUNTER — Encounter (INDEPENDENT_AMBULATORY_CARE_PROVIDER_SITE_OTHER): Payer: Self-pay | Admitting: Nurse Practitioner

## 2018-10-31 ENCOUNTER — Other Ambulatory Visit: Payer: Self-pay

## 2018-10-31 ENCOUNTER — Encounter: Payer: Self-pay | Admitting: Emergency Medicine

## 2018-10-31 DIAGNOSIS — J449 Chronic obstructive pulmonary disease, unspecified: Secondary | ICD-10-CM | POA: Diagnosis not present

## 2018-10-31 DIAGNOSIS — I5032 Chronic diastolic (congestive) heart failure: Secondary | ICD-10-CM | POA: Diagnosis not present

## 2018-10-31 DIAGNOSIS — Z7982 Long term (current) use of aspirin: Secondary | ICD-10-CM | POA: Insufficient documentation

## 2018-10-31 DIAGNOSIS — M7989 Other specified soft tissue disorders: Secondary | ICD-10-CM | POA: Diagnosis not present

## 2018-10-31 DIAGNOSIS — Z87891 Personal history of nicotine dependence: Secondary | ICD-10-CM | POA: Insufficient documentation

## 2018-10-31 DIAGNOSIS — I110 Hypertensive heart disease with heart failure: Secondary | ICD-10-CM | POA: Diagnosis not present

## 2018-10-31 DIAGNOSIS — Z79899 Other long term (current) drug therapy: Secondary | ICD-10-CM | POA: Diagnosis not present

## 2018-10-31 LAB — CBC WITH DIFFERENTIAL/PLATELET
Abs Immature Granulocytes: 0.02 10*3/uL (ref 0.00–0.07)
Basophils Absolute: 0 10*3/uL (ref 0.0–0.1)
Basophils Relative: 1 %
Eosinophils Absolute: 0.2 10*3/uL (ref 0.0–0.5)
Eosinophils Relative: 3 %
HCT: 38.9 % (ref 36.0–46.0)
Hemoglobin: 12.3 g/dL (ref 12.0–15.0)
Immature Granulocytes: 0 %
Lymphocytes Relative: 37 %
Lymphs Abs: 2.1 10*3/uL (ref 0.7–4.0)
MCH: 28 pg (ref 26.0–34.0)
MCHC: 31.6 g/dL (ref 30.0–36.0)
MCV: 88.6 fL (ref 80.0–100.0)
Monocytes Absolute: 0.6 10*3/uL (ref 0.1–1.0)
Monocytes Relative: 10 %
Neutro Abs: 2.9 10*3/uL (ref 1.7–7.7)
Neutrophils Relative %: 49 %
Platelets: 262 10*3/uL (ref 150–400)
RBC: 4.39 MIL/uL (ref 3.87–5.11)
RDW: 14.8 % (ref 11.5–15.5)
WBC: 5.7 10*3/uL (ref 4.0–10.5)
nRBC: 0 % (ref 0.0–0.2)

## 2018-10-31 LAB — COMPREHENSIVE METABOLIC PANEL
ALT: 14 U/L (ref 0–44)
AST: 17 U/L (ref 15–41)
Albumin: 3.9 g/dL (ref 3.5–5.0)
Alkaline Phosphatase: 70 U/L (ref 38–126)
Anion gap: 9 (ref 5–15)
BUN: 12 mg/dL (ref 6–20)
CO2: 27 mmol/L (ref 22–32)
Calcium: 9.3 mg/dL (ref 8.9–10.3)
Chloride: 106 mmol/L (ref 98–111)
Creatinine, Ser: 0.78 mg/dL (ref 0.44–1.00)
GFR calc Af Amer: >60 mL/min (ref >60)
GFR calc non Af Amer: >60 mL/min (ref >60)
Glucose, Bld: 115 mg/dL — ABNORMAL HIGH (ref 70–99)
Potassium: 3.4 mmol/L — ABNORMAL LOW (ref 3.5–5.1)
Sodium: 142 mmol/L (ref 135–145)
Total Bilirubin: 0.7 mg/dL (ref 0.3–1.2)
Total Protein: 7 g/dL (ref 6.5–8.1)

## 2018-10-31 LAB — BRAIN NATRIURETIC PEPTIDE: B Natriuretic Peptide: 59 pg/mL (ref 0.0–100.0)

## 2018-10-31 NOTE — ED Triage Notes (Addendum)
Pt to triage via w/c, mask in place with no distress noted; Brought in by EMS from home for bilat leg swelling x 63mos; st was seen by [facility] with no dx and has had negative u/s; pt st she is here due to pain and was not given any rx for pain; pt denies any accomp symptoms

## 2018-10-31 NOTE — Telephone Encounter (Signed)
Called patient. Left VM for patient to return call to the office. 

## 2018-10-31 NOTE — Progress Notes (Signed)
SUBJECTIVE:  Patient ID: [name], female    DOB: May 28, 1965, 53 y.o.   MRN: [id number] Chief Complaint  Patient presents with  . Follow-up    Varicose veins    HPI  [name] is a 53 y.o. female Patient is seen for evaluation of leg pain and leg swelling. The patient first noticed the swelling remotely. The swelling is associated with pain and discoloration. The pain and swelling worsens with prolonged dependency and improves with elevation. The pain is unrelated to activity.  The patient notes that in the morning the legs are significantly improved but they steadily worsened throughout the course of the day. The patient also notes a steady worsening of the discoloration in the ankle and shin area.   The patient denies claudication symptoms.  The patient denies symptoms consistent with rest pain.  The patient denies and extensive history of DJD and LS spine disease.  The patient has no had any past angiography, interventions or vascular surgery.  Elevation makes the leg symptoms better, dependency makes them much worse. There is no history of ulcerations. The patient denies any recent changes in medications.  The patient has not been wearing graduated compression.  The patient denies a history of DVT or PE. There is no prior history of phlebitis. There is no history of primary lymphedema.  No history of malignancies. No history of trauma or groin or pelvic surgery. There is no history of radiation treatment to the groin or pelvis  The patient denies amaurosis fugax or recent TIA symptoms. There are no recent neurological changes noted. The patient denies recent episodes of angina or shortness of breath  Past Medical History:  Diagnosis Date  . A-fib ([place]) 2010  . Adrenal adenoma, left   . Alcohol abuse   . Allergy   . CHF (congestive heart failure) ([place])   . COPD (chronic obstructive pulmonary disease) ([place])   . Fatty liver   . GERD (gastroesophageal reflux  disease)   . Gout   . Hypertension   . Mild cognitive impairment   . Non-healing non-surgical wound    right leg  . Obesity   . Pulmonary embolus ([place])   . Sleep apnea    uses Cpap  . Stroke ([place])   . Thyroid disease     Past Surgical History:  Procedure Laterality Date  . bilateral wrist fractures  2010  . LEFT HEART CATH AND CORONARY ANGIOGRAPHY N/A 03/27/2018   Procedure: LEFT HEART CATH AND CORONARY ANGIOGRAPHY;  Surgeon: Isaias Cowman, MD;  Location: [facility];  Service: Cardiovascular;  Laterality: N/A;  . lt ankle fracture  2002    Social History   Socioeconomic History  . Marital status: Legally Separated    Spouse name: Not on file  . Number of children: Not on file  . Years of education: Not on file  . Highest education level: Not on file  Occupational History  . Not on file  Social Needs  . Financial resource strain: Not on file  . Food insecurity    Worry: Not on file    Inability: Not on file  . Transportation needs    Medical: Not on file    Non-medical: Not on file  Tobacco Use  . Smoking status: Former Smoker    Types: Cigarettes    Quit date: 01/26/2016    Years since quitting: 2.7  . Smokeless tobacco: Never Used  Substance and Sexual Activity  . Alcohol use: Not  Currently    Comment: Occassionally  . Drug use: No  . Sexual activity: Never  Lifestyle  . Physical activity    Days per week: Not on file    Minutes per session: Not on file  . Stress: Not on file  Relationships  . Social Herbalist on phone: Not on file    Gets together: Not on file    Attends religious service: Not on file    Active member of club or organization: Not on file    Attends meetings of clubs or organizations: Not on file    Relationship status: Not on file  . Intimate partner violence    Fear of current or ex partner: Not on file    Emotionally abused: Not on file    Physically abused: Not on file    Forced sexual activity: Not on  file  Other Topics Concern  . Not on file  Social History Narrative  . Not on file    Family History  Problem Relation Age of Onset  . Prostate cancer Father   . Rectal cancer Sister   . Breast cancer Sister   . Cancer Maternal Grandmother   . Cancer Maternal Grandfather     Allergies  Allergen Reactions  . Morphine And Related Hives  . Penicillins Other (See Comments)    Painful urination Has patient had a PCN reaction causing immediate rash, facial/tongue/throat swelling, SOB or lightheadedness with hypotension: yes Has patient had a PCN reaction causing severe rash involving mucus membranes or skin necrosis: no Has patient had a PCN reaction that required hospitalization no Has patient had a PCN reaction occurring within the last 10 years: no If all of the above answers are "NO", then may proceed with Cephalosporin use.      Review of Systems   Review of Systems: Negative Unless Checked Constitutional: [] Weight loss  [] Fever  [] Chills Cardiac: [] Chest pain   []  Atrial Fibrillation  [] Palpitations   [] Shortness of breath when laying flat   [x] Shortness of breath with exertion. [] Shortness of breath at rest Vascular:  [] Pain in legs with walking   [] Pain in legs with standing [] Pain in legs when laying flat   [] Claudication    [] Pain in feet when laying flat    [] History of DVT   [] Phlebitis   [x] Swelling in legs   [x] Varicose veins   [] Non-healing ulcers Pulmonary:   [] Uses home oxygen   [] Productive cough   [] Hemoptysis   [] Wheeze  [x] COPD   [] Asthma Neurologic:  [] Dizziness   [] Seizures  [] Blackouts [] History of stroke   [] History of TIA  [] Aphasia   [] Temporary Blindness   [] Weakness or numbness in arm   [] Weakness or numbness in leg Musculoskeletal:   [] Joint swelling   [] Joint pain   [] Low back pain  []  History of Knee Replacement [] Arthritis [] back Surgeries  []  Spinal Stenosis    Hematologic:  [] Easy bruising  [] Easy bleeding   [] Hypercoagulable state   [] Anemic  Gastrointestinal:  [] Diarrhea   [] Vomiting  [] Gastroesophageal reflux/heartburn   [] Difficulty swallowing. [] Abdominal pain Genitourinary:  [] Chronic kidney disease   [] Difficult urination  [] Anuric   [] Blood in urine [] Frequent urination  [] Burning with urination   [] Hematuria Skin:  [] Rashes   [] Ulcers [] Wounds Psychological:  [] History of anxiety   []  History of major depression  []  Memory Difficulties      OBJECTIVE:   Physical Exam  BP (!) 193/139 (BP Location: Right Arm)   Pulse 77  Resp 17   Ht 5\' 10"  (1.778 m)   Wt 253 lb (114.8 kg)   BMI 36.30 kg/m   Gen: WD/WN, NAD Head: [place]/AT, No temporalis wasting.  Ear/Nose/Throat: Hearing grossly intact, nares w/o erythema or drainage Eyes: PER, EOMI, sclera nonicteric.  Neck: Supple, no masses.  No JVD.  Pulmonary:  Good air movement, no use of accessory muscles.  Cardiac: RRR Vascular:  Vessel Right Left  Radial Palpable Palpable  Dorsalis Pedis Palpable Palpable  Posterior Tibial Palpable Palpable   Gastrointestinal: soft, non-distended. No guarding/no peritoneal signs.  Musculoskeletal: M/S 5/5 throughout.  No deformity or atrophy.  Neurologic: Pain and light touch intact in extremities.  Symmetrical.  Speech is fluent. Motor exam as listed above. Psychiatric: Judgment intact, Mood & affect appropriate for pt's clinical situation. Dermatologic: No Venous rashes. No Ulcers Noted.  No changes consistent with cellulitis. Lymph : No Cervical lymphadenopathy, no lichenification or skin changes of chronic lymphedema.       ASSESSMENT AND PLAN:  1. Pain in both lower extremities  Recommend:  The patient has atypical pain symptoms for pure atherosclerotic disease. However, on physical exam there is evidence of mixed venous and arterial disease, given the diminished pulses and the edema associated with venous changes of the legs.  Noninvasive studies including ABI's and venous ultrasound of the legs will be obtained and the  patient will follow up with me to review these studies.  The patient should continue walking and begin a more formal exercise program. The patient should continue his antiplatelet therapy and aggressive treatment of the lipid abnormalities.  The patient should begin wearing graduated compression socks 15-20 mmHg strength to control edema.   2. Essential hypertension Continue antihypertensive medications as already ordered, these medications have been reviewed and there are no changes at this time.   3. Hyperlipidemia, unspecified hyperlipidemia type Continue statin as ordered and reviewed, no changes at this time    Current Outpatient Medications on File Prior to Visit  Medication Sig Dispense Refill  . albuterol (VENTOLIN HFA) 108 (90 Base) MCG/ACT inhaler Inhale 2 puffs into the lungs every 6 (six) hours as needed for wheezing or shortness of breath. 1 Inhaler 5  . allopurinol (ZYLOPRIM) 300 MG tablet Take 1 tablet (300 mg total) by mouth daily. 30 tablet 6  . amLODipine (NORVASC) 10 MG tablet TAKE 1 TABLET BY MOUTH EVERY DAY 30 tablet 0  . aspirin EC 81 MG tablet Take 81 mg by mouth daily.    Marland Kitchen atorvastatin (LIPITOR) 40 MG tablet Take 1 tablet (40 mg total) by mouth daily. 90 tablet 1  . cloNIDine (CATAPRES) 0.1 MG tablet Take 1 tablet (0.1 mg total) by mouth 2 (two) times daily. 60 tablet 6  . colchicine 0.6 MG tablet Take 1 tablet (0.6 mg total) by mouth 2 (two) times daily. As needed for gout flares 30 tablet 1  . gabapentin (NEURONTIN) 300 MG capsule Take 1 capsule (300 mg total) by mouth 2 (two) times daily. 60 capsule 5  . hydrALAZINE (APRESOLINE) 50 MG tablet TAKE 1 TABLET BY MOUTH THREE TIMES A DAY 90 tablet 2  . lisinopril (ZESTRIL) 20 MG tablet TAKE 1 TABLET BY MOUTH EVERY DAY 30 tablet 5  . metoprolol (TOPROL XL) 200 MG 24 hr tablet Take 1 tablet (200 mg total) by mouth daily. 90 tablet 3  . ondansetron (ZOFRAN ODT) 4 MG disintegrating tablet Take 1 tablet (4 mg total) by  mouth every 8 (eight) hours as needed. 10  tablet 0  . pantoprazole (PROTONIX) 40 MG tablet TAKE 1 TABLET BY MOUTH EVERY DAY 90 tablet 0  . potassium chloride SA (KLOR-CON M20) 20 MEQ tablet Take 1 tablet (20 mEq total) by mouth 2 (two) times daily. 60 tablet 5  . sertraline (ZOLOFT) 100 MG tablet TAKE 1 TABLET BY MOUTH EVERY DAY 30 tablet 1  . tiotropium (SPIRIVA HANDIHALER) 18 MCG inhalation capsule Place 1 capsule (18 mcg total) into inhaler and inhale daily. 30 capsule 5  . traMADol (ULTRAM) 50 MG tablet Take 1 tablet (50 mg total) by mouth 3 (three) times daily as needed. 20 tablet 0  . furosemide (LASIX) 40 MG tablet Take 1 tablet (40 mg total) by mouth daily. 90 tablet 3   No current facility-administered medications on file prior to visit.     There are no Patient Instructions on file for this visit. No follow-ups on file.   Kris Hartmann, NP  This note was completed with Sales executive.  Any errors are purely unintentional.

## 2018-11-01 ENCOUNTER — Emergency Department: Payer: Medicaid Other

## 2018-11-01 ENCOUNTER — Emergency Department
Admission: EM | Admit: 2018-11-01 | Discharge: 2018-11-01 | Disposition: A | Discharge status: Home / Self Care | Payer: Medicaid Other | Attending: Emergency Medicine | Admitting: Emergency Medicine

## 2018-11-01 DIAGNOSIS — M7989 Other specified soft tissue disorders: Secondary | ICD-10-CM

## 2018-11-01 MED ORDER — TRAMADOL HCL 50 MG PO TABS: 50.0000 mg | ORAL_TABLET | Freq: Four times a day (QID) | ORAL | 0 refills | Status: DC | PRN

## 2018-11-01 MED ORDER — ACETAMINOPHEN 500 MG PO TABS
1000.0000 mg | ORAL_TABLET | Freq: Once | ORAL | Status: AC
  Administered 2018-11-01: 1000 mg via ORAL
  Filled 2018-11-01: qty 2

## 2018-11-01 MED ORDER — TRAMADOL HCL 50 MG PO TABS
50.0000 mg | ORAL_TABLET | Freq: Once | ORAL | Status: AC
  Administered 2018-11-01: 50 mg via ORAL
  Filled 2018-11-01: qty 1

## 2018-11-01 NOTE — Telephone Encounter (Signed)
Called and left a message asking patient to return my call.  

## 2018-11-01 NOTE — ED Provider Notes (Signed)
[facility] Emergency Department Provider Note  ____________________________________________  Time seen: Approximately 2:04 AM  I have reviewed the triage vital signs and the nursing notes.   HISTORY  Chief Complaint Leg Swelling   HPI [name] is a 53 y.o. female with a history of A. fib, HFpEF, stroke, PE not on anticoagulation who presents for evaluation of bilateral leg pain and swelling. Patient's symptoms have been ongoing for 5 months.  She is complaining of constant severe throbbing pain on both her legs.  Nothing makes the pain better or worse.  The pain is unchanged at rest or with ambulation.  She was finally able to see a vascular surgeon 2 days ago who have ordered Doppler studies and ABIs.  Patient underwent Doppler studies 3 months ago which were negative for DVT.  She does have a history of PE and is not on anticoagulation.  She reports that the only medication her primary care doctor has given her for pain is naproxen which makes her "fall asleep" but does not help her pain when she is awake.  She reports that she is here today because she could not sleep tonight due to the pain.  She denies any changes in the pain quality or intensity.  Past Medical History:  Diagnosis Date   A-fib [facility]) 2010   Adrenal adenoma, left    Alcohol abuse    Allergy    CHF (congestive heart failure) (HCC)    COPD (chronic obstructive pulmonary disease) (HCC)    Fatty liver    GERD (gastroesophageal reflux disease)    Gout    Hypertension    Mild cognitive impairment    Non-healing non-surgical wound    right leg   Obesity    Pulmonary embolus (HCC)    Sleep apnea    uses Cpap   Stroke [facility])    Thyroid disease     Patient Active Problem List   Diagnosis Date Noted   Hallucinations, visual 07/03/2018   Unstable angina ([place]) 03/25/2018   Gout 05/09/2017   COPD (chronic obstructive pulmonary disease) ([place]) 04/10/2017   Sleep  apnea 04/10/2017   History of pulmonary embolism 04/10/2017   History of thyroid disease 04/10/2017   Hyperlipidemia 12/31/2016   Essential hypertension 05/26/2016   Chronic diastolic heart failure ([place]) 05/26/2016   Hypokalemia 05/26/2016   A-fib ([place]) 01/26/2008    Past Surgical History:  Procedure Laterality Date   bilateral wrist fractures  2010   LEFT HEART CATH AND CORONARY ANGIOGRAPHY N/A 03/27/2018   Procedure: LEFT HEART CATH AND CORONARY ANGIOGRAPHY;  Surgeon: Isaias Cowman, MD;  Location: [place] [facility];  Service: Cardiovascular;  Laterality: N/A;   lt ankle fracture  2002    Prior to Admission medications   Medication Sig Start Date End Date Taking? Authorizing Provider  albuterol (VENTOLIN HFA) 108 (90 Base) MCG/ACT inhaler Inhale 2 puffs into the lungs every 6 (six) hours as needed for wheezing or shortness of breath. 07/03/18   Alisa Graff, FNP  allopurinol (ZYLOPRIM) 300 MG tablet Take 1 tablet (300 mg total) by mouth daily. 08/02/18   Volney American, PA-C  amLODipine (NORVASC) 10 MG tablet TAKE 1 TABLET BY MOUTH EVERY DAY 07/19/18   Volney American, PA-C  aspirin EC 81 MG tablet Take 81 mg by mouth daily.    [provider]  atorvastatin (LIPITOR) 40 MG tablet Take 1 tablet (40 mg total) by mouth daily. 11/14/17   Merrie Roof  Elizabeth, PA-C  cloNIDine (CATAPRES) 0.1 MG tablet Take 1 tablet (0.1 mg total) by mouth 2 (two) times daily. 11/08/17   Volney American, PA-C  colchicine 0.6 MG tablet Take 1 tablet (0.6 mg total) by mouth 2 (two) times daily. As needed for gout flares 09/28/18   Volney American, PA-C  furosemide (LASIX) 40 MG tablet Take 1 tablet (40 mg total) by mouth daily. 12/26/17 10/16/18  Alisa Graff, FNP  gabapentin (NEURONTIN) 300 MG capsule Take 1 capsule (300 mg total) by mouth 2 (two) times daily. 09/28/18   Volney American, PA-C  hydrALAZINE (APRESOLINE) 50 MG tablet TAKE 1 TABLET BY  MOUTH THREE TIMES A DAY 11/24/17   Volney American, PA-C  lisinopril (ZESTRIL) 20 MG tablet TAKE 1 TABLET BY MOUTH EVERY DAY 07/19/18   Darylene Price A, FNP  metoprolol (TOPROL XL) 200 MG 24 hr tablet Take 1 tablet (200 mg total) by mouth daily. 12/01/17   Alisa Graff, FNP  ondansetron (ZOFRAN ODT) 4 MG disintegrating tablet Take 1 tablet (4 mg total) by mouth every 8 (eight) hours as needed. 04/25/18   Eula Listen, MD  pantoprazole (PROTONIX) 40 MG tablet TAKE 1 TABLET BY MOUTH EVERY DAY 09/19/18   Volney American, PA-C  potassium chloride SA (KLOR-CON M20) 20 MEQ tablet Take 1 tablet (20 mEq total) by mouth 2 (two) times daily. 07/19/18   Alisa Graff, FNP  sertraline (ZOLOFT) 100 MG tablet TAKE 1 TABLET BY MOUTH EVERY DAY 05/17/18   Volney American, PA-C  tiotropium (SPIRIVA HANDIHALER) 18 MCG inhalation capsule Place 1 capsule (18 mcg total) into inhaler and inhale daily. 07/03/18   Alisa Graff, FNP  traMADol (ULTRAM) 50 MG tablet Take 1 tablet (50 mg total) by mouth every 6 (six) hours as needed. 11/01/18 11/01/19  Rudene Re, MD    Allergies Morphine and related and Penicillins  Family History  Problem Relation Age of Onset   Prostate cancer Father    Rectal cancer Sister    Breast cancer Sister    Cancer Maternal Grandmother    Cancer Maternal Grandfather     Social History Social History   Tobacco Use   Smoking status: Former Smoker    Types: Cigarettes    Quit date: 01/26/2016    Years since quitting: 2.7   Smokeless tobacco: Never Used  Substance Use Topics   Alcohol use: Not Currently    Comment: Occassionally   Drug use: No    Review of Systems  Constitutional: Negative for fever. Eyes: Negative for visual changes. ENT: Negative for sore throat. Neck: No neck pain  Cardiovascular: Negative for chest pain. Respiratory: Negative for shortness of breath. Gastrointestinal: Negative for abdominal pain, vomiting or  diarrhea. Genitourinary: Negative for dysuria. Musculoskeletal: Negative for back pain. + bilateral leg pain and swelling Skin: Negative for rash. Neurological: Negative for headaches, weakness or numbness. Psych: No SI or HI  ____________________________________________   PHYSICAL EXAM:  VITAL SIGNS: ED Triage Vitals  Enc Vitals Group     BP 10/31/18 2301 (!) 131/100     Pulse Rate 10/31/18 2301 80     Resp 10/31/18 2301 20     Temp 10/31/18 2301 (!) 97.5 F (36.4 C)     Temp Source 10/31/18 2301 Oral     SpO2 10/31/18 2301 97 %     Weight 10/31/18 2302 244 lb (110.7 kg)     Height 10/31/18 2302 5\' 10"  (1.778 m)  Head Circumference --      Peak Flow --      Pain Score 10/31/18 2301 10     Pain Loc --      Pain Edu? --      Excl. in [place]? --     Constitutional: Alert and oriented. Well appearing and in no apparent distress. HEENT:      Head: Normocephalic and atraumatic.         Eyes: Conjunctivae are normal. Sclera is non-icteric.       Mouth/Throat: Mucous membranes are moist.       Neck: Supple with no signs of meningismus. Cardiovascular: Regular rate and rhythm. No murmurs, gallops, or rubs. 2+ symmetrical distal pulses are present in all extremities. No JVD. Respiratory: Normal respiratory effort. Lungs are clear to auscultation bilaterally. No wheezes, crackles, or rhonchi.  Gastrointestinal: Soft, non tender Musculoskeletal: Bilateral nonpitting edema with right worse than left, limbs are warm and well-perfused with brisk capillary refill, normal palpable DP and PT pulses bilaterally, no erythema or warmth  neurologic: Normal speech and language. Face is symmetric. Moving all extremities. No gross focal neurologic deficits are appreciated. Skin: Skin is warm, dry and intact. No rash noted. Psychiatric: Mood and affect are normal. Speech and behavior are normal.  ____________________________________________   LABS (all labs ordered are listed, but only  abnormal results are displayed)  Labs Reviewed  COMPREHENSIVE METABOLIC PANEL - Abnormal; Notable for the following components:      Result Value   Potassium 3.4 (*)    Glucose, Bld 115 (*)    All other components within normal limits  CBC WITH DIFFERENTIAL/PLATELET  BRAIN NATRIURETIC PEPTIDE   ____________________________________________  EKG  none  ____________________________________________  RADIOLOGY  I have personally reviewed the images performed during this visit and I agree with the Radiologist's read.   Interpretation by Radiologist:  US Venous Img Lower Bilateral  Result Date: 11/01/2018 CLINICAL DATA:  Pain and swelling EXAM: BILATERAL LOWER EXTREMITY VENOUS DOPPLER ULTRASOUND TECHNIQUE: Gray-scale sonography with graded compression, as well as color Doppler and duplex ultrasound were performed to evaluate the lower extremity deep venous systems from the level of the common femoral vein and including the common femoral, femoral, profunda femoral, popliteal and calf veins including the posterior tibial, peroneal and gastrocnemius veins when visible. The superficial great saphenous vein was also interrogated. Spectral Doppler was utilized to evaluate flow at rest and with distal augmentation maneuvers in the common femoral, femoral and popliteal veins. COMPARISON:  None. FINDINGS: RIGHT LOWER EXTREMITY Common Femoral Vein: No evidence of thrombus. Normal compressibility, respiratory phasicity and response to augmentation. Saphenofemoral Junction: No evidence of thrombus. Normal compressibility and flow on color Doppler imaging. Profunda Femoral Vein: No evidence of thrombus. Normal compressibility and flow on color Doppler imaging. Femoral Vein: No evidence of thrombus. Normal compressibility, respiratory phasicity and response to augmentation. Popliteal Vein: No evidence of thrombus. Normal compressibility, respiratory phasicity and response to augmentation. Calf Veins: No  evidence of thrombus. Normal compressibility and flow on color Doppler imaging. Superficial Great Saphenous Vein: No evidence of thrombus. Normal compressibility. Venous Reflux:  None. Other Findings:  None. LEFT LOWER EXTREMITY Common Femoral Vein: No evidence of thrombus. Normal compressibility, respiratory phasicity and response to augmentation. Saphenofemoral Junction: No evidence of thrombus. Normal compressibility and flow on color Doppler imaging. Profunda Femoral Vein: No evidence of thrombus. Normal compressibility and flow on color Doppler imaging. Femoral Vein: No evidence of thrombus. Normal compressibility, respiratory phasicity and response to augmentation.  Popliteal Vein: No evidence of thrombus. Normal compressibility, respiratory phasicity and response to augmentation. Calf Veins: No evidence of thrombus. Normal compressibility and flow on color Doppler imaging. Superficial Great Saphenous Vein: No evidence of thrombus. Normal compressibility. Venous Reflux:  None. Other Findings:  None. IMPRESSION: No evidence of deep venous thrombosis in either lower extremity. Electronically Signed   By: Prudencio Pair M.D.   On: 11/01/2018 03:19      ____________________________________________   PROCEDURES  Procedure(s) performed: None Procedures Critical Care performed:  None ____________________________________________   INITIAL IMPRESSION / ASSESSMENT AND PLAN / ED COURSE  53 y.o. female with a history of A. fib, HFpEF, stroke, PE not on anticoagulation who presents for evaluation of bilateral leg pain and swelling x 5 months. No changes in symptoms, patient unable to sleep due to pain this evening and only has naproxen at home. Seen by vascular 2 days ago. Scheduled for a repeat doppler US on the 21st. Since patient has a h/o PE in the past and is not anticoagulated I repeated doppler US now which is again negative for DVT. No signs of ischemia or cellulitis. Labs wnl with normal kidney  function, no anemia, normal electrolytes, and normal BNP.  I explained to the patient that as an ER physician I am unable to provide prescriptions for management of chronic pain however I am happy to give her 2 days worth of tramadol so she can try at home.  Explained she will have to call her doctor in the morning to request any further prescriptions.  Also recommended that she keeps her appointment on the 21st for her ABIs and follow-up with vascular surgery.   .    As part of my medical decision making, I reviewed the following data within the [place] notes reviewed and incorporated, Labs reviewed , EKG interpreted , Old chart reviewed, Radiograph reviewed , Notes from prior ED visits and [place] Controlled Substance Database   Patient was evaluated in Emergency Department today for the symptoms described in the history of present illness. Patient was evaluated in the context of the global COVID-19 pandemic, which necessitated consideration that the patient might be at risk for infection with the SARS-CoV-2 virus that causes COVID-19. Institutional protocols and algorithms that pertain to the evaluation of patients at risk for COVID-19 are in a state of rapid change based on information released by regulatory bodies including the CDC and federal and state organizations. These policies and algorithms were followed during the patient's care in the ED.   ____________________________________________   FINAL CLINICAL IMPRESSION(S) / ED DIAGNOSES   Final diagnoses:  Leg swelling      NEW MEDICATIONS STARTED DURING THIS VISIT:  ED Discharge Orders         Ordered    traMADol (ULTRAM) 50 MG tablet  Every 6 hours PRN     11/01/18 0327           Note:  This document was prepared using Dragon voice recognition software and may include unintentional dictation errors.    Alfred Levins, [place], MD 11/01/18 [id number]

## 2018-11-01 NOTE — ED Notes (Signed)
Ultrasound at bedside

## 2018-11-01 NOTE — ED Notes (Signed)
Patient states she has an appointment in Oct. 21 for her leg issues.  Patient states she was seen at [facility] for her leg pain and was given naproxen. Patient states that medication is putting her to sleep and wanting something else that wont make her sleep but helps with her pain. Patient states she has seen her PCP.

## 2018-11-01 NOTE — Discharge Instructions (Addendum)
Follow-up with vascular surgery.  Return to the emergency room for worsening pain, redness of your leg, fever, chest pain or shortness of breath.  Pain control: Take tylenol 1000mg  every 8 hours. Take 50 mg of tramadol every 6 hours for breakthrough pain. If you need the tramadol make sure to take one senokot as well to prevent constipation.  Do not drink alcohol, drive or participate in any other potentially dangerous activities while taking this medication as it may make you sleepy. Do not take this medication with any other sedating medications, either prescription or over-the-counter.

## 2018-11-01 NOTE — ED Notes (Signed)
Ultrasound complete 

## 2018-11-01 NOTE — ED Notes (Signed)
Patient legs swollen with +1 pitting bilaterally.  Color appears to be WDL. Will continue to monitor.

## 2018-11-02 ENCOUNTER — Telehealth: Payer: Self-pay

## 2018-11-02 NOTE — Telephone Encounter (Signed)
[facility]

## 2018-11-02 NOTE — Telephone Encounter (Signed)
Patient called and would like to be scheduled for a visit tomorrow, tried to call patient, no answer, will close this encounter and leave the other one open.

## 2018-11-02 NOTE — Telephone Encounter (Signed)
  Called patient to schedule appt, no anwer, left a message for patient to call and schedule.  Copied from [place] [id number]. Topic: Appointment Scheduling - Scheduling Inquiry for Clinic >> Nov 02, 2018  8:41 AM Richardo Priest, NT wrote: Reason for CRM: Patient called in and wanted to inform PCP that she was in the ED due to her legs swelling. They are swollen to the beginning of her thigh and red, as well as warm to touch. Patient stated the ED discharged her and told her they could not find anything. Patient is in pain and would like something for to be prescribed as ED did not give her anything. Patient is also trying to make an appointment for tomorrow as she does not have transportation today. Please advise. >> Nov 02, 2018  9:11 AM Don Perking M wrote: Could you call and schedule this appt please.

## 2018-11-03 ENCOUNTER — Encounter: Payer: Self-pay | Admitting: Family Medicine

## 2018-11-03 ENCOUNTER — Ambulatory Visit (INDEPENDENT_AMBULATORY_CARE_PROVIDER_SITE_OTHER): Payer: Medicaid Other | Admitting: Family Medicine

## 2018-11-03 ENCOUNTER — Other Ambulatory Visit: Payer: Self-pay

## 2018-11-03 VITALS — BP 141/85 | HR 89 | Temp 98.6°F

## 2018-11-03 DIAGNOSIS — I10 Essential (primary) hypertension: Secondary | ICD-10-CM

## 2018-11-03 DIAGNOSIS — M79604 Pain in right leg: Secondary | ICD-10-CM | POA: Diagnosis not present

## 2018-11-03 DIAGNOSIS — R7301 Impaired fasting glucose: Secondary | ICD-10-CM | POA: Insufficient documentation

## 2018-11-03 DIAGNOSIS — I5032 Chronic diastolic (congestive) heart failure: Secondary | ICD-10-CM

## 2018-11-03 DIAGNOSIS — J449 Chronic obstructive pulmonary disease, unspecified: Secondary | ICD-10-CM

## 2018-11-03 DIAGNOSIS — I4891 Unspecified atrial fibrillation: Secondary | ICD-10-CM

## 2018-11-03 DIAGNOSIS — Z1231 Encounter for screening mammogram for malignant neoplasm of breast: Secondary | ICD-10-CM

## 2018-11-03 DIAGNOSIS — M79605 Pain in left leg: Secondary | ICD-10-CM

## 2018-11-03 DIAGNOSIS — E785 Hyperlipidemia, unspecified: Secondary | ICD-10-CM

## 2018-11-03 MED ORDER — SERTRALINE HCL 100 MG PO TABS: 100.0000 mg | ORAL_TABLET | Freq: Every day | ORAL | 1 refills | Status: DC

## 2018-11-03 MED ORDER — AMLODIPINE BESYLATE 10 MG PO TABS: 10.0000 mg | ORAL_TABLET | Freq: Every day | ORAL | 1 refills | Status: DC

## 2018-11-03 MED ORDER — SPIRIVA HANDIHALER 18 MCG IN CAPS: 18.0000 ug | ORAL_CAPSULE | Freq: Every day | RESPIRATORY_TRACT | 5 refills | Status: DC

## 2018-11-03 NOTE — Progress Notes (Signed)
BP (!) 141/85   Pulse 89   Temp 98.6 F (37 C) (Oral)   SpO2 97%    Subjective:    Patient ID: [name], female    DOB: Nov 08, 1965, 53 y.o.   MRN: [id number]  HPI: AASHIKA Randall is a 53 y.o. female  Chief Complaint  Patient presents with  . Hospitalization Follow-up   Patient here today for ER f/u for ongoing lower leg pains and swelling. Currently being worked up by Vascular Specialist for her pains as they have been poorly controlled with tramadol, oxycodone, and gabapentin. Tramadol was refilled by ER, but she states all this does is help her rest and it doesn't really take the pain away for her. Denies significant swelling, redness, warmth at this time but pain is constant and significant.   Also due for 6 month f/u chronic conditions. IFG currently diet controlled. Not following a particular diet and unable to exercise due to leg pains. No low blood sugar spells, polyuria, polydipsia. HLD on lipitor without side effects. Denies CP, SOB, myalgias. HTN has been under fairly good control the past few months with readings typically below 140/90, but the pain has become severe which is seeming to elevate it at times when she hasn't taken any medication for the leg pain. HF followed by HF Clinic closely.   Moods fairly stable on zoloft, denies severe mood swings, SI/HI.   Relevant past medical, surgical, family and social history reviewed and updated as indicated. Interim medical history since our last visit reviewed. Allergies and medications reviewed and updated.  Review of Systems  Per HPI unless specifically indicated above     Objective:    BP (!) 141/85   Pulse 89   Temp 98.6 F (37 C) (Oral)   SpO2 97%   Wt Readings from Last 3 Encounters:  10/31/18 244 lb (110.7 kg)  10/27/18 253 lb (114.8 kg)  10/16/18 244 lb (110.7 kg)    Physical Exam Vitals signs and nursing note reviewed.  Constitutional:      Appearance: Normal appearance. She is not ill-appearing.   HENT:     Head: Atraumatic.  Eyes:     Extraocular Movements: Extraocular movements intact.     Conjunctiva/sclera: Conjunctivae normal.  Neck:     Musculoskeletal: Normal range of motion and neck supple.  Cardiovascular:     Rate and Rhythm: Normal rate and regular rhythm.     Heart sounds: Normal heart sounds.  Pulmonary:     Effort: Pulmonary effort is normal.     Breath sounds: Normal breath sounds.  Musculoskeletal: Normal range of motion.  Skin:    General: Skin is warm and dry.     Findings: Erythema present.  Neurological:     Mental Status: She is alert and oriented to person, place, and time.  Psychiatric:        Mood and Affect: Mood normal.        Thought Content: Thought content normal.        Judgment: Judgment normal.     Results for orders placed or performed in visit on 11/03/18  HgB A1c  Result Value Ref Range   Hgb A1c MFr Bld 6.0 (H) 4.8 - 5.6 %   Est. average glucose Bld gHb Est-mCnc 126 mg/dL  Lipid Panel w/o Chol/HDL Ratio  Result Value Ref Range   Cholesterol, Total 177 100 - 199 mg/dL   Triglycerides 127 0 - 149 mg/dL   HDL 45 >39 mg/dL  VLDL Cholesterol Cal 23 5 - 40 mg/dL   LDL Chol Calc (NIH) 109 (H) 0 - 99 mg/dL      Assessment & Plan:   Problem List Items Addressed This Visit      Cardiovascular and Mediastinum   Essential hypertension (Chronic)    BP mildly elevated today likely secondary to pain, the past few months has been WNL when checked in clinic. Continue current regimen, will work on better pain control      Relevant Medications   amLODipine (NORVASC) 10 MG tablet   Chronic diastolic heart failure (HCC) (Chronic)    Euvolemic today, continue current regimen per HF Clinic      Relevant Medications   amLODipine (NORVASC) 10 MG tablet   A-fib (HCC)    Rate controlled, stable without palpitations. Continue current regimen      Relevant Medications   amLODipine (NORVASC) 10 MG tablet     Respiratory   COPD (chronic  obstructive pulmonary disease) (HCC)    Breathing stable without flares, continue current regimen      Relevant Medications   tiotropium (SPIRIVA HANDIHALER) 18 MCG inhalation capsule     Endocrine   IFG (impaired fasting glucose)    Recheck A1C, adjust as needed.       Relevant Orders   HgB A1c (Completed)     Other   Hyperlipidemia - Primary    Recheck lipids, adjust as needed. Work on diet and exercise changes for improved control      Relevant Medications   amLODipine (NORVASC) 10 MG tablet   Other Relevant Orders   Lipid Panel w/o Chol/HDL Ratio (Completed)    Other Visit Diagnoses    Bilateral leg pain       Continue workup per Vascular, will also refer to Pain Management given her severe uncontrolled pain. Continue rare prn tramadol use per ER   Relevant Orders   Ambulatory referral to Pain Clinic   Encounter for screening mammogram for malignant neoplasm of breast       Relevant Orders   MM DIGITAL SCREENING BILATERAL       Follow up plan: Return in about 6 months (around 05/04/2019) for 6 month f/u.

## 2018-11-04 LAB — LIPID PANEL W/O CHOL/HDL RATIO
Cholesterol, Total: 177 mg/dL (ref 100–199)
HDL: 45 mg/dL (ref >39)
LDL Chol Calc (NIH): 109 mg/dL — ABNORMAL HIGH (ref 0–99)
Triglycerides: 127 mg/dL (ref 0–149)
VLDL Cholesterol Cal: 23 mg/dL (ref 5–40)

## 2018-11-04 LAB — HEMOGLOBIN A1C
Est. average glucose Bld gHb Est-mCnc: 126 mg/dL
Hgb A1c MFr Bld: 6 % — ABNORMAL HIGH (ref 4.8–5.6)

## 2018-11-06 ENCOUNTER — Encounter: Payer: Self-pay | Admitting: Family Medicine

## 2018-11-08 NOTE — Assessment & Plan Note (Signed)
Recheck A1C, adjust as needed.

## 2018-11-08 NOTE — Assessment & Plan Note (Signed)
BP mildly elevated today likely secondary to pain, the past few months has been WNL when checked in clinic. Continue current regimen, will work on better pain control

## 2018-11-08 NOTE — Assessment & Plan Note (Signed)
Rate controlled, stable without palpitations. Continue current regimen

## 2018-11-08 NOTE — Assessment & Plan Note (Signed)
Breathing stable without flares, continue current regimen

## 2018-11-08 NOTE — Assessment & Plan Note (Signed)
Recheck lipids, adjust as needed. Work on diet and exercise changes for improved control

## 2018-11-08 NOTE — Assessment & Plan Note (Signed)
Euvolemic today, continue current regimen per HF Clinic

## 2018-11-11 ENCOUNTER — Emergency Department
Admission: EM | Admit: 2018-11-11 | Discharge: 2018-11-11 | Disposition: A | Discharge status: Home / Self Care | Payer: Medicaid Other | Attending: Emergency Medicine | Admitting: Emergency Medicine

## 2018-11-11 ENCOUNTER — Encounter: Payer: Self-pay | Admitting: Emergency Medicine

## 2018-11-11 ENCOUNTER — Other Ambulatory Visit: Payer: Self-pay

## 2018-11-11 DIAGNOSIS — E876 Hypokalemia: Secondary | ICD-10-CM | POA: Insufficient documentation

## 2018-11-11 DIAGNOSIS — I4891 Unspecified atrial fibrillation: Secondary | ICD-10-CM | POA: Diagnosis not present

## 2018-11-11 DIAGNOSIS — I110 Hypertensive heart disease with heart failure: Secondary | ICD-10-CM | POA: Diagnosis not present

## 2018-11-11 DIAGNOSIS — Z20828 Contact with and (suspected) exposure to other viral communicable diseases: Secondary | ICD-10-CM | POA: Insufficient documentation

## 2018-11-11 DIAGNOSIS — B349 Viral infection, unspecified: Secondary | ICD-10-CM | POA: Diagnosis not present

## 2018-11-11 DIAGNOSIS — Z79899 Other long term (current) drug therapy: Secondary | ICD-10-CM | POA: Diagnosis not present

## 2018-11-11 DIAGNOSIS — J449 Chronic obstructive pulmonary disease, unspecified: Secondary | ICD-10-CM | POA: Insufficient documentation

## 2018-11-11 DIAGNOSIS — Z87891 Personal history of nicotine dependence: Secondary | ICD-10-CM | POA: Insufficient documentation

## 2018-11-11 DIAGNOSIS — E039 Hypothyroidism, unspecified: Secondary | ICD-10-CM | POA: Insufficient documentation

## 2018-11-11 DIAGNOSIS — Z7982 Long term (current) use of aspirin: Secondary | ICD-10-CM | POA: Diagnosis not present

## 2018-11-11 DIAGNOSIS — M791 Myalgia, unspecified site: Secondary | ICD-10-CM | POA: Diagnosis present

## 2018-11-11 DIAGNOSIS — I509 Heart failure, unspecified: Secondary | ICD-10-CM | POA: Insufficient documentation

## 2018-11-11 LAB — URINALYSIS, COMPLETE (UACMP) WITH MICROSCOPIC
Bacteria, UA: NONE SEEN
Bilirubin Urine: NEGATIVE
Glucose, UA: NEGATIVE mg/dL
Hgb urine dipstick: NEGATIVE
Ketones, ur: NEGATIVE mg/dL
Leukocytes,Ua: NEGATIVE
Nitrite: NEGATIVE
Protein, ur: NEGATIVE mg/dL
Specific Gravity, Urine: 1.014 (ref 1.005–1.030)
pH: 5 (ref 5.0–8.0)

## 2018-11-11 LAB — CBC WITH DIFFERENTIAL/PLATELET
Abs Immature Granulocytes: 0.04 10*3/uL (ref 0.00–0.07)
Basophils Absolute: 0 10*3/uL (ref 0.0–0.1)
Basophils Relative: 0 %
Eosinophils Absolute: 0.1 10*3/uL (ref 0.0–0.5)
Eosinophils Relative: 1 %
HCT: 39.8 % (ref 36.0–46.0)
Hemoglobin: 12.6 g/dL (ref 12.0–15.0)
Immature Granulocytes: 1 %
Lymphocytes Relative: 26 %
Lymphs Abs: 1.9 10*3/uL (ref 0.7–4.0)
MCH: 27.9 pg (ref 26.0–34.0)
MCHC: 31.7 g/dL (ref 30.0–36.0)
MCV: 88.1 fL (ref 80.0–100.0)
Monocytes Absolute: 0.8 10*3/uL (ref 0.1–1.0)
Monocytes Relative: 11 %
Neutro Abs: 4.6 10*3/uL (ref 1.7–7.7)
Neutrophils Relative %: 61 %
Platelets: 286 10*3/uL (ref 150–400)
RBC: 4.52 MIL/uL (ref 3.87–5.11)
RDW: 14.7 % (ref 11.5–15.5)
WBC: 7.4 10*3/uL (ref 4.0–10.5)
nRBC: 0 % (ref 0.0–0.2)

## 2018-11-11 LAB — INFLUENZA PANEL BY PCR (TYPE A & B)
Influenza A By PCR: NEGATIVE
Influenza B By PCR: NEGATIVE

## 2018-11-11 LAB — COMPREHENSIVE METABOLIC PANEL
ALT: 20 U/L (ref 0–44)
AST: 28 U/L (ref 15–41)
Albumin: 3.6 g/dL (ref 3.5–5.0)
Alkaline Phosphatase: 63 U/L (ref 38–126)
Anion gap: 10 (ref 5–15)
BUN: 9 mg/dL (ref 6–20)
CO2: 28 mmol/L (ref 22–32)
Calcium: 9.4 mg/dL (ref 8.9–10.3)
Chloride: 105 mmol/L (ref 98–111)
Creatinine, Ser: 0.71 mg/dL (ref 0.44–1.00)
GFR calc Af Amer: >60 mL/min (ref >60)
GFR calc non Af Amer: >60 mL/min (ref >60)
Glucose, Bld: 107 mg/dL — ABNORMAL HIGH (ref 70–99)
Potassium: 3 mmol/L — ABNORMAL LOW (ref 3.5–5.1)
Sodium: 143 mmol/L (ref 135–145)
Total Bilirubin: 0.9 mg/dL (ref 0.3–1.2)
Total Protein: 7 g/dL (ref 6.5–8.1)

## 2018-11-11 LAB — GROUP A STREP BY PCR: Group A Strep by PCR: NOT DETECTED

## 2018-11-11 MED ORDER — POTASSIUM CHLORIDE ER 10 MEQ PO TBCR: 10.0000 meq | EXTENDED_RELEASE_TABLET | Freq: Every day | ORAL | 0 refills | Status: DC

## 2018-11-11 NOTE — Discharge Instructions (Signed)
You have been diagnosed with mild hypokalemia.  Please take supplemental potassium prescribed during his emergency department encounter once daily for the next 5 days. Please stay quarantined at home until COVID-19 results return.

## 2018-11-11 NOTE — ED Triage Notes (Signed)
Pt to ED via [facility] from home for flu like symptoms. Pt is in NAD.

## 2018-11-11 NOTE — ED Provider Notes (Signed)
[facility] Emergency Department Provider Note  ____________________________________________  Time seen: Approximately 9:07 PM  I have reviewed the triage vital signs and the nursing notes.   HISTORY  Chief Complaint Generalized Body Aches    HPI [name] is a 53 y.o. female with a history of A. fib, CHF, COPD and GERD presents to the emergency department with concern for headache, pharyngitis, body aches, chills and fatigue over the past 3 to 4 days.  Patient also states that she has lost her sense of smell. She denies cough. She is concerned that she has COVID-19.  Patient reports that she lives alone and does not have any caretakers at home and became concerned.  She denies chest pain, chest tightness or abdominal pain.  She has had some mild diarrhea.  Patient states that her appetite has been diminished.  She has been sleeping more at home. No alleviating measures have been attempted.         Past Medical History:  Diagnosis Date  . A-fib ([place]) 2010  . Adrenal adenoma, left   . Alcohol abuse   . Allergy   . CHF (congestive heart failure) ([place])   . COPD (chronic obstructive pulmonary disease) ([place])   . Fatty liver   . GERD (gastroesophageal reflux disease)   . Gout   . Hypertension   . Mild cognitive impairment   . Non-healing non-surgical wound    right leg  . Obesity   . Pulmonary embolus ([place])   . Sleep apnea    uses Cpap  . Stroke ([place])   . Thyroid disease     Patient Active Problem List   Diagnosis Date Noted  . IFG (impaired fasting glucose) 11/03/2018  . Hallucinations, visual 07/03/2018  . Unstable angina ([place]) 03/25/2018  . Gout 05/09/2017  . COPD (chronic obstructive pulmonary disease) ([place]) 04/10/2017  . Sleep apnea 04/10/2017  . History of pulmonary embolism 04/10/2017  . History of thyroid disease 04/10/2017  . Hyperlipidemia 12/31/2016  . Essential hypertension 05/26/2016  . Chronic diastolic heart failure ([place])  05/26/2016  . Hypokalemia 05/26/2016  . A-fib ([place]) 01/26/2008    Past Surgical History:  Procedure Laterality Date  . bilateral wrist fractures  2010  . LEFT HEART CATH AND CORONARY ANGIOGRAPHY N/A 03/27/2018   Procedure: LEFT HEART CATH AND CORONARY ANGIOGRAPHY;  Surgeon: Isaias Cowman, MD;  Location: [facility];  Service: Cardiovascular;  Laterality: N/A;  . lt ankle fracture  2002    Prior to Admission medications   Medication Sig Start Date End Date Taking? Authorizing Provider  albuterol (VENTOLIN HFA) 108 (90 Base) MCG/ACT inhaler Inhale 2 puffs into the lungs every 6 (six) hours as needed for wheezing or shortness of breath. 07/03/18   Alisa Graff, FNP  allopurinol (ZYLOPRIM) 300 MG tablet Take 1 tablet (300 mg total) by mouth daily. 08/02/18   Volney American, PA-C  amLODipine (NORVASC) 10 MG tablet Take 1 tablet (10 mg total) by mouth daily. 11/03/18   Volney American, PA-C  aspirin EC 81 MG tablet Take 81 mg by mouth daily.    [provider]  atorvastatin (LIPITOR) 40 MG tablet Take 1 tablet (40 mg total) by mouth daily. 11/14/17   Volney American, PA-C  cloNIDine (CATAPRES) 0.1 MG tablet Take 1 tablet (0.1 mg total) by mouth 2 (two) times daily. 11/08/17   Volney American, PA-C  colchicine 0.6 MG tablet Take 1 tablet (0.6 mg total) by mouth  2 (two) times daily. As needed for gout flares 09/28/18   Volney American, PA-C  furosemide (LASIX) 40 MG tablet Take 1 tablet (40 mg total) by mouth daily. 12/26/17 10/16/18  Alisa Graff, FNP  gabapentin (NEURONTIN) 300 MG capsule Take 1 capsule (300 mg total) by mouth 2 (two) times daily. 09/28/18   Volney American, PA-C  hydrALAZINE (APRESOLINE) 50 MG tablet TAKE 1 TABLET BY MOUTH THREE TIMES A DAY Patient not taking: Reported on 11/03/2018 11/24/17   Volney American, PA-C  lisinopril (ZESTRIL) 20 MG tablet TAKE 1 TABLET BY MOUTH EVERY DAY 07/19/18   Darylene Price A, FNP   metoprolol (TOPROL XL) 200 MG 24 hr tablet Take 1 tablet (200 mg total) by mouth daily. 12/01/17   Alisa Graff, FNP  ondansetron (ZOFRAN ODT) 4 MG disintegrating tablet Take 1 tablet (4 mg total) by mouth every 8 (eight) hours as needed. 04/25/18   Eula Listen, MD  pantoprazole (PROTONIX) 40 MG tablet TAKE 1 TABLET BY MOUTH EVERY DAY 09/19/18   Volney American, PA-C  potassium chloride (KLOR-CON) 10 MEQ tablet Take 1 tablet (10 mEq total) by mouth daily for 5 days. 11/11/18 11/16/18  Lannie Fields, PA-C  potassium chloride SA (KLOR-CON M20) 20 MEQ tablet Take 1 tablet (20 mEq total) by mouth 2 (two) times daily. 07/19/18   Alisa Graff, FNP  sertraline (ZOLOFT) 100 MG tablet Take 1 tablet (100 mg total) by mouth daily. 11/03/18   Volney American, PA-C  tiotropium (SPIRIVA HANDIHALER) 18 MCG inhalation capsule Place 1 capsule (18 mcg total) into inhaler and inhale daily. 11/03/18   Volney American, PA-C  traMADol (ULTRAM) 50 MG tablet Take 1 tablet (50 mg total) by mouth every 6 (six) hours as needed. 11/01/18 11/01/19  Rudene Re, MD    Allergies Morphine and related and Penicillins  Family History  Problem Relation Age of Onset  . Prostate cancer Father   . Rectal cancer Sister   . Breast cancer Sister   . Cancer Maternal Grandmother   . Cancer Maternal Grandfather     Social History Social History   Tobacco Use  . Smoking status: Former Smoker    Types: Cigarettes    Quit date: 01/26/2016    Years since quitting: 2.7  . Smokeless tobacco: Never Used  Substance Use Topics  . Alcohol use: Not Currently    Comment: Occassionally  . Drug use: No      Review of Systems  Constitutional: Patient has chills.  Eyes: No visual changes. No discharge ENT: Patient has congestion.  Cardiovascular: no chest pain. Respiratory: No cough.  Gastrointestinal: No abdominal pain.  No nausea, no vomiting. Patient had diarrhea.  Genitourinary: Negative  for dysuria. No hematuria Musculoskeletal: Patient has myalgias.  Skin: Negative for rash, abrasions, lacerations, ecchymosis. Neurological: Patient has headache, no focal weakness or numbness.     ____________________________________________   PHYSICAL EXAM:  VITAL SIGNS: ED Triage Vitals  Enc Vitals Group     BP 11/11/18 1625 (!) 146/98     Pulse Rate 11/11/18 1625 72     Resp 11/11/18 1625 16     Temp 11/11/18 1625 97.6 F (36.4 C)     Temp Source 11/11/18 1625 Oral     SpO2 11/11/18 1625 97 %     Weight 11/11/18 1622 234 lb (106.1 kg)     Height 11/11/18 1622 5\' 10"  (1.778 m)     Head Circumference --  Peak Flow --      Pain Score 11/11/18 1622 10     Pain Loc --      Pain Edu? --      Excl. in [place]? --      Constitutional: Alert and oriented. Patient is lying supine. Eyes: Conjunctivae are normal. PERRL. EOMI. Head: Atraumatic. ENT:      Ears: Tympanic membranes are mildly injected with mild effusion bilaterally.       Nose: No congestion/rhinnorhea.      Mouth/Throat: Mucous membranes are moist. Posterior pharynx is mildly erythematous.  Hematological/Lymphatic/Immunilogical: No cervical lymphadenopathy.  Cardiovascular: Normal rate, regular rhythm. Normal S1 and S2.  Good peripheral circulation. Respiratory: Normal respiratory effort without tachypnea or retractions. Lungs CTAB. Good air entry to the bases with no decreased or absent breath sounds. Gastrointestinal: Bowel sounds 4 quadrants. Soft and nontender to palpation. No guarding or rigidity. No palpable masses. No distention. No CVA tenderness. Musculoskeletal: Full range of motion to all extremities. No gross deformities appreciated. Neurologic:  Normal speech and language. No gross focal neurologic deficits are appreciated.  Skin:  Skin is warm, dry and intact. No rash noted. Psychiatric: Mood and affect are normal. Speech and behavior are normal. Patient exhibits appropriate insight and  judgement.    ____________________________________________   LABS (all labs ordered are listed, but only abnormal results are displayed)  Labs Reviewed  URINALYSIS, COMPLETE (UACMP) WITH MICROSCOPIC - Abnormal; Notable for the following components:      Result Value   Color, Urine YELLOW (*)    APPearance CLEAR (*)    All other components within normal limits  COMPREHENSIVE METABOLIC PANEL - Abnormal; Notable for the following components:   Potassium 3.0 (*)    Glucose, Bld 107 (*)    All other components within normal limits  GROUP A STREP BY PCR  SARS CORONAVIRUS 2 (TAT 6-24 HRS)  INFLUENZA PANEL BY PCR (TYPE A & B)  CBC WITH DIFFERENTIAL/PLATELET   ____________________________________________  EKG   ____________________________________________  RADIOLOGY   No results found.  ____________________________________________    PROCEDURES  Procedure(s) performed:    Procedures    Medications - No data to display   ____________________________________________   INITIAL IMPRESSION / ASSESSMENT AND PLAN / ED COURSE  Pertinent labs & imaging results that were available during my care of the patient were reviewed by me and considered in my medical decision making (see chart for details).  Review of the [place] CSRS was performed in accordance of the [place] prior to dispensing any controlled drugs.  Clinical Course as of Nov 10 2105  Sat Nov 11, 2018  2027 Potassium(!): 3.0 [JW]    Clinical Course User Index [JW] Bryauna, Aldea M, PA-C          Assessment and plan Mild hypokalemia Viral syndrome  53 year old female presents to the emergency department with headache, body aches, sore throat and loss of smell over the past 2 to 3 days in the absence of cough.  Patient was afebrile at triage.  She was hypertensive but vital signs were otherwise stable.  On physical exam, patient appeared to be resting comfortably.  Posterior pharynx was mildly erythematous.   No adventitious lung sounds were auscultated.  Basic labs were obtained in the emergency department and CMP was concerning for mild hypokalemia.  No leukocytosis.  Urinalysis was noncontributory for cystitis. Group A Strep testing was negative.  COVID-19 testing is in process at this time.  Patient was discharged with supplemental potassium  for the next 5 days.  Rest and hydration were encouraged.  Patient was advised to stay quarantined in her home until COVID-19 results return.  She voiced understanding.  PENNIE SHAW was evaluated in Emergency Department on 11/11/2018 for the symptoms described in the history of present illness. She was evaluated in the context of the global COVID-19 pandemic, which necessitated consideration that the patient might be at risk for infection with the SARS-CoV-2 virus that causes COVID-19. Institutional protocols and algorithms that pertain to the evaluation of patients at risk for COVID-19 are in a state of rapid change based on information released by regulatory bodies including the CDC and federal and state organizations. These policies and algorithms were followed during the patient's care in the ED.    ____________________________________________  FINAL CLINICAL IMPRESSION(S) / ED DIAGNOSES  Final diagnoses:  Viral syndrome  Hypokalemia      NEW MEDICATIONS STARTED DURING THIS VISIT:  ED Discharge Orders         Ordered    potassium chloride (KLOR-CON) 10 MEQ tablet  Daily     11/11/18 2042              This chart was dictated using voice recognition software/Dragon. Despite best efforts to proofread, errors can occur which can change the meaning. Any change was purely unintentional.    Layaan, Quist 11/11/18 2120    Nance Pear, MD 11/11/18 2147

## 2018-11-12 LAB — SARS CORONAVIRUS 2 (TAT 6-24 HRS): SARS Coronavirus 2: NEGATIVE

## 2018-11-14 ENCOUNTER — Ambulatory Visit: Payer: Self-pay | Admitting: Family Medicine

## 2018-11-14 NOTE — Telephone Encounter (Signed)
Pt reports "Aching all over." States 12/10 body aches, chills alternating with sweating. Does not have thermometer. Seen in ED for similar symptoms 10/17. Covid and flu tests pending. K+ level low in ED, 3.0, taking supplements as ordered. Denies CP, no SOB, denies back pain, dysuria.  "Just achy." Reports sore throat. Has not taken anything OTC for aches; advised tylenol, stay hydrated, rest. Advised ED if symptoms worsen. Assured pt [place] would route to practice for Ms. Lane's review. Pt verbalizes understanding. After hours call. Please advise: [phone]  Reason for Disposition . [1] MODERATE pain (e.g., interferes with normal activities) AND [2] present > 3 days  Answer Assessment - Initial Assessment Questions 1. ONSET: "When did the muscle aches or body pains start?"      Saturday 2. LOCATION: "What part of your body is hurting?" (e.g., entire body, arms, legs)      Entire body 3. SEVERITY: "How bad is the pain?" (Scale 1-10; or mild, moderate, severe)   - MILD (1-3): doesn't interfere with normal activities    - MODERATE (4-7): interferes with normal activities or awakens from sleep    - SEVERE (8-10):  excruciating pain, unable to do any normal activities     12-13/10 4. CAUSE: "What do you think is causing the pains?"     Unsure 5. FEVER: "Have you been having fever?"     Sweating/ chills 6. OTHER SYMPTOMS: "Do you have any other symptoms?" (e.g., chest pain, weakness, rash, cold or flu symptoms, weight loss)     No appetite, leg swelling for 5 months  Protocols used: MUSCLE ACHES AND BODY PAIN-A-AH

## 2018-11-15 ENCOUNTER — Encounter (INDEPENDENT_AMBULATORY_CARE_PROVIDER_SITE_OTHER): Payer: Medicaid Other

## 2018-11-15 ENCOUNTER — Ambulatory Visit (INDEPENDENT_AMBULATORY_CARE_PROVIDER_SITE_OTHER): Payer: Medicaid Other | Admitting: Nurse Practitioner

## 2018-11-15 NOTE — Telephone Encounter (Signed)
Called patient. Left VM for patient to return call to the office. 

## 2018-11-15 NOTE — Telephone Encounter (Signed)
Looks like test results for viral infection negative. Let's recheck her potassium Friday with a lab visit if possible and have her stop the lipitor in case all of a sudden that's causing sxs. Recheck next week if not improving.

## 2018-11-16 NOTE — Telephone Encounter (Signed)
Called patient. Left VM for patient to return call to the office. 

## 2018-11-17 ENCOUNTER — Telehealth: Payer: Self-pay | Admitting: General Practice

## 2018-11-17 NOTE — Telephone Encounter (Signed)
Called patient. Left VM for patient to return call to the office. 

## 2018-11-17 NOTE — Telephone Encounter (Signed)
Negative COVID results given. Patient results "NOT Detected." Caller expressed understanding. ° °

## 2018-11-20 NOTE — Telephone Encounter (Signed)
OK to send letter. Thanks.

## 2018-11-20 NOTE — Telephone Encounter (Signed)
Called patient. Left VM for patient to return call to the office.  Apolonio Schneiders we have been unable to reach this patient after multiple tries, what would you like to do?

## 2018-11-21 NOTE — Telephone Encounter (Signed)
Letter sent.

## 2018-11-29 ENCOUNTER — Ambulatory Visit (INDEPENDENT_AMBULATORY_CARE_PROVIDER_SITE_OTHER): Payer: Medicaid Other

## 2018-11-29 ENCOUNTER — Ambulatory Visit (INDEPENDENT_AMBULATORY_CARE_PROVIDER_SITE_OTHER): Payer: Medicaid Other | Admitting: Nurse Practitioner

## 2018-11-29 ENCOUNTER — Other Ambulatory Visit: Payer: Self-pay

## 2018-11-29 ENCOUNTER — Encounter (INDEPENDENT_AMBULATORY_CARE_PROVIDER_SITE_OTHER): Payer: Self-pay | Admitting: Nurse Practitioner

## 2018-11-29 VITALS — BP 203/146 | HR 69 | Resp 16 | Wt 242.8 lb

## 2018-11-29 DIAGNOSIS — M79604 Pain in right leg: Secondary | ICD-10-CM

## 2018-11-29 DIAGNOSIS — M79605 Pain in left leg: Secondary | ICD-10-CM

## 2018-11-29 DIAGNOSIS — E785 Hyperlipidemia, unspecified: Secondary | ICD-10-CM

## 2018-11-29 DIAGNOSIS — I890 Lymphedema, not elsewhere classified: Secondary | ICD-10-CM | POA: Diagnosis not present

## 2018-11-29 DIAGNOSIS — J449 Chronic obstructive pulmonary disease, unspecified: Secondary | ICD-10-CM

## 2018-12-03 ENCOUNTER — Encounter (INDEPENDENT_AMBULATORY_CARE_PROVIDER_SITE_OTHER): Payer: Self-pay | Admitting: Nurse Practitioner

## 2018-12-03 NOTE — Progress Notes (Signed)
SUBJECTIVE:  Patient ID: [name], female    DOB: 1965/02/28, 53 y.o.   MRN: [id number] Chief Complaint  Patient presents with  . Follow-up    ultrasound follow up    HPI  [name] is a 53 y.o. female Patient is seen for evaluation of leg pain and leg swelling. The patient first noticed the swelling remotely. The swelling is associated with pain and discoloration. The pain and swelling worsens with prolonged dependency and improves with elevation. The pain is unrelated to activity.  The patient notes that in the morning the legs are significantly improved but they steadily worsened throughout the course of the day. The patient also notes a steady worsening of the discoloration in the ankle and shin area.   The patient denies claudication symptoms.  The patient denies symptoms consistent with rest pain.  The patient denies and extensive history of DJD and LS spine disease.  The patient has no had any past angiography, interventions or vascular surgery.  Elevation makes the leg symptoms better, dependency makes them much worse. There is no history of ulcerations. The patient denies any recent changes in medications.  The patient has not been wearing graduated compression.  The patient denies a history of DVT or PE. There is no prior history of phlebitis. There is no history of primary lymphedema.  No history of malignancies. No history of trauma or groin or pelvic surgery. There is no history of radiation treatment to the groin or pelvis  The patient denies amaurosis fugax or recent TIA symptoms. There are no recent neurological changes noted. The patient denies recent episodes of angina or shortness of breath  The patient underwent ABIs which reveals an ABI 1.19 on the right and 1.17 on the left.  She has triphasic waveforms in the bilateral tibial arteries.  The patient underwent bilateral venous reflux studies which reveals no evidence of reflux within the right lower  extremity and reflux at the great saphenous vein at the saphenofemoral junction.  There is no evidence of DVT or superficial venous thrombosis bilaterally.  Past Medical History:  Diagnosis Date  . A-fib ([place]) 2010  . Adrenal adenoma, left   . Alcohol abuse   . Allergy   . CHF (congestive heart failure) ([place])   . COPD (chronic obstructive pulmonary disease) ([place])   . Fatty liver   . GERD (gastroesophageal reflux disease)   . Gout   . Hypertension   . Mild cognitive impairment   . Non-healing non-surgical wound    right leg  . Obesity   . Pulmonary embolus ([place])   . Sleep apnea    uses Cpap  . Stroke ([place])   . Thyroid disease     Past Surgical History:  Procedure Laterality Date  . bilateral wrist fractures  2010  . LEFT HEART CATH AND CORONARY ANGIOGRAPHY N/A 03/27/2018   Procedure: LEFT HEART CATH AND CORONARY ANGIOGRAPHY;  Surgeon: Isaias Cowman, MD;  Location: [facility];  Service: Cardiovascular;  Laterality: N/A;  . lt ankle fracture  2002    Social History   Socioeconomic History  . Marital status: Legally Separated    Spouse name: Not on file  . Number of children: Not on file  . Years of education: Not on file  . Highest education level: Not on file  Occupational History  . Not on file  Social Needs  . Financial resource strain: Not on file  . Food insecurity    Worry:  Not on file    Inability: Not on file  . Transportation needs    Medical: Not on file    Non-medical: Not on file  Tobacco Use  . Smoking status: Former Smoker    Types: Cigarettes    Quit date: 01/26/2016    Years since quitting: 2.8  . Smokeless tobacco: Never Used  Substance and Sexual Activity  . Alcohol use: Not Currently    Comment: Occassionally  . Drug use: No  . Sexual activity: Never  Lifestyle  . Physical activity    Days per week: Not on file    Minutes per session: Not on file  . Stress: Not on file  Relationships  . Social Herbalist on  phone: Not on file    Gets together: Not on file    Attends religious service: Not on file    Active member of club or organization: Not on file    Attends meetings of clubs or organizations: Not on file    Relationship status: Not on file  . Intimate partner violence    Fear of current or ex partner: Not on file    Emotionally abused: Not on file    Physically abused: Not on file    Forced sexual activity: Not on file  Other Topics Concern  . Not on file  Social History Narrative  . Not on file    Family History  Problem Relation Age of Onset  . Prostate cancer Father   . Rectal cancer Sister   . Breast cancer Sister   . Cancer Maternal Grandmother   . Cancer Maternal Grandfather     Allergies  Allergen Reactions  . Morphine And Related Hives  . Penicillins Other (See Comments)    Painful urination Has patient had a PCN reaction causing immediate rash, facial/tongue/throat swelling, SOB or lightheadedness with hypotension: yes Has patient had a PCN reaction causing severe rash involving mucus membranes or skin necrosis: no Has patient had a PCN reaction that required hospitalization no Has patient had a PCN reaction occurring within the last 10 years: no If all of the above answers are "NO", then may proceed with Cephalosporin use.      Review of Systems   Review of Systems: Negative Unless Checked Constitutional: [] Weight loss  [] Fever  [] Chills Cardiac: [] Chest pain   []  Atrial Fibrillation  [] Palpitations   [] Shortness of breath when laying flat   [] Shortness of breath with exertion. [] Shortness of breath at rest Vascular:  [] Pain in legs with walking   [x] Pain in legs with standing [x] Pain in legs when laying flat   [] Claudication    [] Pain in feet when laying flat    [] History of DVT   [] Phlebitis   [x] Swelling in legs   [x] Varicose veins   [] Non-healing ulcers Pulmonary:   [] Uses home oxygen   [] Productive cough   [] Hemoptysis   [] Wheeze  [] COPD   [] Asthma  Neurologic:  [] Dizziness   [] Seizures  [] Blackouts [] History of stroke   [] History of TIA  [] Aphasia   [] Temporary Blindness   [] Weakness or numbness in arm   [x] Weakness or numbness in leg Musculoskeletal:   [] Joint swelling   [] Joint pain   [] Low back pain  []  History of Knee Replacement [x] Arthritis [] back Surgeries  []  Spinal Stenosis    Hematologic:  [] Easy bruising  [] Easy bleeding   [] Hypercoagulable state   [] Anemic Gastrointestinal:  [] Diarrhea   [] Vomiting  [x] Gastroesophageal reflux/heartburn   [] Difficulty swallowing. [] Abdominal  pain Genitourinary:  [] Chronic kidney disease   [] Difficult urination  [] Anuric   [] Blood in urine [] Frequent urination  [] Burning with urination   [] Hematuria Skin:  [] Rashes   [] Ulcers [] Wounds Psychological:  [] History of anxiety   []  History of major depression  []  Memory Difficulties      OBJECTIVE:   Physical Exam  BP (!) 203/146 (BP Location: Right Arm)   Pulse 69   Resp 16   Wt 242 lb 12.8 oz (110.1 kg)   BMI 34.84 kg/m   Gen: WD/WN, NAD Head: [place]/AT, No temporalis wasting.  Ear/Nose/Throat: Hearing grossly intact, nares w/o erythema or drainage Eyes: PER, EOMI, sclera nonicteric.  Neck: Supple, no masses.  No JVD.  Pulmonary:  Good air movement, no use of accessory muscles.  Cardiac: RRR Vascular:  2-3+ soft edema bilaterally, bilateral stasis dermatitis Vessel Right Left  Radial Palpable Palpable  Dorsalis Pedis Palpable Palpable  Posterior Tibial Palpable Palpable   Gastrointestinal: soft, non-distended. No guarding/no peritoneal signs.  Musculoskeletal: M/S 5/5 throughout.  No deformity or atrophy.  Neurologic: Pain and light touch intact in extremities.  Symmetrical.  Speech is fluent. Motor exam as listed above. Psychiatric: Judgment intact, Mood & affect appropriate for pt's clinical situation. Dermatologic: No Venous rashes. No Ulcers Noted.  No changes consistent with cellulitis. Lymph : No Cervical lymphadenopathy, dermal  thickening bilaterally      ASSESSMENT AND PLAN:  1. Lymphedema The patient does have extensive swelling of her bilateral lower extremities.  We will place the patient in bilateral Unna wraps to gain control of the swelling as well as to see if this alleviates the patient's pain.  The patient will continue to elevate her lower extremities much as possible as well as exercise 30 minutes/day.  Patient will present to the office to have her wraps changed on a weekly basis, we will reevaluate her lower extremity edema after 4 weeks.  Pending results of Unna wraps we will discuss lymph pump therapy as well.  2. Chronic obstructive pulmonary disease, unspecified COPD type ([place]) Continue pulmonary medications and aerosols as already ordered, these medications have been reviewed and there are no changes at this time.    3. Hyperlipidemia, unspecified hyperlipidemia type Continue statin as ordered and reviewed, no changes at this time   4. Pain in both lower extremities Recommend:  I do not find evidence of Vascular pathology that would explain the patient's symptoms  The patient has atypical pain symptoms for vascular disease  I do not find evidence of Vascular pathology that would explain the patient's symptoms and I suspect the patient is c/o pseudoclaudication.  Patient should have an evaluation of his LS spine which I defer to the primary service.  Noninvasive studies including venous ultrasound of the legs do not identify vascular problems   The patient should continue his antiplatelet therapy and aggressive treatment of the lipid abnormalities.  Further work-up of her lower extremity pain is deferred to the primary service        Current Outpatient Medications on File Prior to Visit  Medication Sig Dispense Refill  . albuterol (VENTOLIN HFA) 108 (90 Base) MCG/ACT inhaler Inhale 2 puffs into the lungs every 6 (six) hours as needed for wheezing or shortness of breath. 1 Inhaler  5  . allopurinol (ZYLOPRIM) 300 MG tablet Take 1 tablet (300 mg total) by mouth daily. 30 tablet 6  . amLODipine (NORVASC) 10 MG tablet Take 1 tablet (10 mg total) by mouth daily. 90 tablet 1  .  aspirin EC 81 MG tablet Take 81 mg by mouth daily.    Marland Kitchen atorvastatin (LIPITOR) 40 MG tablet Take 1 tablet (40 mg total) by mouth daily. 90 tablet 1  . cloNIDine (CATAPRES) 0.1 MG tablet Take 1 tablet (0.1 mg total) by mouth 2 (two) times daily. 60 tablet 6  . colchicine 0.6 MG tablet Take 1 tablet (0.6 mg total) by mouth 2 (two) times daily. As needed for gout flares 30 tablet 1  . gabapentin (NEURONTIN) 300 MG capsule Take 1 capsule (300 mg total) by mouth 2 (two) times daily. 60 capsule 5  . lisinopril (ZESTRIL) 20 MG tablet TAKE 1 TABLET BY MOUTH EVERY DAY 30 tablet 5  . metoprolol (TOPROL XL) 200 MG 24 hr tablet Take 1 tablet (200 mg total) by mouth daily. 90 tablet 3  . ondansetron (ZOFRAN ODT) 4 MG disintegrating tablet Take 1 tablet (4 mg total) by mouth every 8 (eight) hours as needed. 10 tablet 0  . pantoprazole (PROTONIX) 40 MG tablet TAKE 1 TABLET BY MOUTH EVERY DAY 90 tablet 0  . potassium chloride SA (KLOR-CON M20) 20 MEQ tablet Take 1 tablet (20 mEq total) by mouth 2 (two) times daily. 60 tablet 5  . sertraline (ZOLOFT) 100 MG tablet Take 1 tablet (100 mg total) by mouth daily. 90 tablet 1  . tiotropium (SPIRIVA HANDIHALER) 18 MCG inhalation capsule Place 1 capsule (18 mcg total) into inhaler and inhale daily. 30 capsule 5  . traMADol (ULTRAM) 50 MG tablet Take 1 tablet (50 mg total) by mouth every 6 (six) hours as needed. 12 tablet 0  . furosemide (LASIX) 40 MG tablet Take 1 tablet (40 mg total) by mouth daily. 90 tablet 3  . hydrALAZINE (APRESOLINE) 50 MG tablet TAKE 1 TABLET BY MOUTH THREE TIMES A DAY (Patient not taking: Reported on 11/03/2018) 90 tablet 2  . potassium chloride (KLOR-CON) 10 MEQ tablet Take 1 tablet (10 mEq total) by mouth daily for 5 days. 5 tablet 0   No current  facility-administered medications on file prior to visit.     There are no Patient Instructions on file for this visit. No follow-ups on file.   Kris Hartmann, NP  This note was completed with Sales executive.  Any errors are purely unintentional.

## 2018-12-06 ENCOUNTER — Ambulatory Visit (INDEPENDENT_AMBULATORY_CARE_PROVIDER_SITE_OTHER): Payer: Medicaid Other | Admitting: Nurse Practitioner

## 2018-12-06 ENCOUNTER — Other Ambulatory Visit: Payer: Self-pay

## 2018-12-06 ENCOUNTER — Encounter (INDEPENDENT_AMBULATORY_CARE_PROVIDER_SITE_OTHER): Payer: Self-pay | Admitting: Nurse Practitioner

## 2018-12-06 DIAGNOSIS — I890 Lymphedema, not elsewhere classified: Secondary | ICD-10-CM | POA: Insufficient documentation

## 2018-12-06 NOTE — Progress Notes (Signed)
History of Present Illness  There is no documented history at this time  Assessments & Plan   There are no diagnoses linked to this encounter.    Additional instructions  Subjective:  Patient presents with venous ulcer of the Bilateral lower extremity.    Procedure:  3 layer unna wrap was placed Bilateral lower extremity.   Plan:   Follow up in one week.  

## 2018-12-13 ENCOUNTER — Encounter (INDEPENDENT_AMBULATORY_CARE_PROVIDER_SITE_OTHER): Payer: Self-pay

## 2018-12-13 ENCOUNTER — Ambulatory Visit (INDEPENDENT_AMBULATORY_CARE_PROVIDER_SITE_OTHER): Payer: Medicaid Other | Admitting: Nurse Practitioner

## 2018-12-13 ENCOUNTER — Other Ambulatory Visit: Payer: Self-pay

## 2018-12-13 VITALS — BP 161/110 | HR 94 | Resp 16 | Wt 239.8 lb

## 2018-12-13 DIAGNOSIS — I890 Lymphedema, not elsewhere classified: Secondary | ICD-10-CM | POA: Diagnosis not present

## 2018-12-13 NOTE — Progress Notes (Signed)
History of Present Illness  There is no documented history at this time  Assessments & Plan   There are no diagnoses linked to this encounter.    Additional instructions  Subjective:  Patient presents with venous ulcer of the Bilateral lower extremity.    Procedure:  3 layer unna wrap was placed Bilateral lower extremity.   Plan:   Follow up in one week.  

## 2018-12-17 ENCOUNTER — Encounter (INDEPENDENT_AMBULATORY_CARE_PROVIDER_SITE_OTHER): Payer: Self-pay | Admitting: Nurse Practitioner

## 2018-12-20 ENCOUNTER — Ambulatory Visit (INDEPENDENT_AMBULATORY_CARE_PROVIDER_SITE_OTHER): Payer: Medicaid Other | Admitting: Nurse Practitioner

## 2018-12-20 ENCOUNTER — Other Ambulatory Visit: Payer: Self-pay | Admitting: Family Medicine

## 2018-12-20 ENCOUNTER — Other Ambulatory Visit: Payer: Self-pay

## 2018-12-20 VITALS — BP 142/100 | HR 84 | Resp 16 | Ht 69.0 in | Wt 239.0 lb

## 2018-12-20 DIAGNOSIS — I890 Lymphedema, not elsewhere classified: Secondary | ICD-10-CM | POA: Diagnosis not present

## 2018-12-20 NOTE — Progress Notes (Signed)
History of Present Illness  There is no documented history at this time  Assessments & Plan   There are no diagnoses linked to this encounter.    Additional instructions  Subjective:  Patient presents with venous ulcer of the Bilateral lower extremity.    Procedure:  3 layer unna wrap was placed Bilateral lower extremity.   Plan:   Follow up in one week.  

## 2018-12-26 ENCOUNTER — Encounter (INDEPENDENT_AMBULATORY_CARE_PROVIDER_SITE_OTHER): Payer: Self-pay | Admitting: Nurse Practitioner

## 2018-12-27 ENCOUNTER — Ambulatory Visit (INDEPENDENT_AMBULATORY_CARE_PROVIDER_SITE_OTHER): Payer: Medicaid Other | Admitting: Nurse Practitioner

## 2018-12-28 ENCOUNTER — Ambulatory Visit (INDEPENDENT_AMBULATORY_CARE_PROVIDER_SITE_OTHER): Payer: Medicaid Other | Admitting: Nurse Practitioner

## 2018-12-28 ENCOUNTER — Other Ambulatory Visit: Payer: Self-pay

## 2018-12-28 VITALS — BP 144/101 | HR 59 | Resp 14 | Ht 69.0 in | Wt 241.0 lb

## 2018-12-28 DIAGNOSIS — I890 Lymphedema, not elsewhere classified: Secondary | ICD-10-CM

## 2018-12-28 NOTE — Progress Notes (Signed)
History of Present Illness  There is no documented history at this time  Assessments & Plan   There are no diagnoses linked to this encounter.    Additional instructions  Subjective:  Patient presents with venous ulcer of the Bilateral lower extremity.    Procedure:  3 layer unna wrap was placed Bilateral lower extremity.   Plan:   Follow up in one week.  

## 2018-12-29 ENCOUNTER — Other Ambulatory Visit: Payer: Self-pay | Admitting: Family Medicine

## 2018-12-29 MED ORDER — HYDRALAZINE HCL 50 MG PO TABS: 50.0000 mg | ORAL_TABLET | Freq: Three times a day (TID) | ORAL | 0 refills | Status: DC

## 2018-12-29 MED ORDER — SPIRIVA HANDIHALER 18 MCG IN CAPS: 18.0000 ug | ORAL_CAPSULE | Freq: Every day | RESPIRATORY_TRACT | 0 refills | Status: DC

## 2018-12-29 NOTE — Telephone Encounter (Signed)
Routing to provider  

## 2018-12-29 NOTE — Telephone Encounter (Signed)
Requested medication (s) are due for refill today: yes  Requested medication (s) are on the active medication list: yes  Last refill:  07/03/2018  Future visit scheduled: yes  Notes to clinic:  Review for refill Last filled by different provider    Requested Prescriptions  Pending Prescriptions Disp Refills   albuterol (VENTOLIN HFA) 108 (90 Base) MCG/ACT inhaler      Sig: Inhale 2 puffs into the lungs every 6 (six) hours as needed for wheezing or shortness of breath.     Pulmonology:  Beta Agonists Failed - 12/29/2018 10:50 AM      Failed - One inhaler should last at least one month. If the patient is requesting refills earlier, contact the patient to check for uncontrolled symptoms.      Passed - Valid encounter within last 12 months    Recent Outpatient Visits          1 month ago Hyperlipidemia, unspecified hyperlipidemia type   [facility] [place], [place]   2 months ago Pain of right lower extremity   [place], [place], [place]   3 months ago Chronic gout without tophus, unspecified cause, unspecified site   [facility], [place], [place]   4 months ago Leg edema, left   [place], [place], [place]   4 months ago Chronic diastolic heart failure [facility])   [facility] [place], [place]      Future Appointments            In 3 days Orene Desanctis, Lilia Argue, PA-C [employer], [facility]           Signed Prescriptions Disp Refills   hydrALAZINE (APRESOLINE) 50 MG tablet 90 tablet 0    Sig: Take 1 tablet (50 mg total) by mouth 3 (three) times daily.     Cardiovascular:  Vasodilators Failed - 12/29/2018 10:50 AM      Failed - Last BP in normal range    BP Readings from Last 1 Encounters:  12/28/18 (!) 144/101         Passed - HCT in normal range and within 360 days    HCT  Date Value Ref Range Status  11/11/2018 39.8 36.0 - 46.0 % Final    Hematocrit  Date Value Ref Range Status  05/09/2017 35.4 34.0 - 46.6 % Final         Passed - HGB in normal range and within 360 days    Hemoglobin  Date Value Ref Range Status  11/11/2018 12.6 12.0 - 15.0 g/dL Final  05/09/2017 11.6 11.1 - 15.9 g/dL Final         Passed - RBC in normal range and within 360 days    RBC  Date Value Ref Range Status  11/11/2018 4.52 3.87 - 5.11 MIL/uL Final         Passed - WBC in normal range and within 360 days    WBC  Date Value Ref Range Status  11/11/2018 7.4 4.0 - 10.5 K/uL Final         Passed - PLT in normal range and within 360 days    Platelets  Date Value Ref Range Status  11/11/2018 286 150 - 400 K/uL Final  05/09/2017 346 150 - 379 x10E3/uL Final         Passed - Valid encounter within last 12 months    Recent Outpatient Visits  1 month ago Hyperlipidemia, unspecified hyperlipidemia type   [place], [place]   2 months ago Pain of right lower extremity   [place], [place], [place]   3 months ago Chronic gout without tophus, unspecified cause, unspecified site   [facility], [place], [place]   4 months ago Leg edema, left   [place], [place], [place]   4 months ago Chronic diastolic heart failure [facility])   [facility] [place], [place]      Future Appointments            In 3 days Orene Desanctis, Lilia Argue, PA-C [place], [facility]            tiotropium (SPIRIVA HANDIHALER) 18 MCG inhalation capsule 30 capsule 0    Sig: Place 1 capsule (18 mcg total) into inhaler and inhale daily.     Pulmonology:  Anticholinergic Agents Passed - 12/29/2018 10:50 AM      Passed - Valid encounter within last 12 months    Recent Outpatient Visits          1 month ago Hyperlipidemia, unspecified hyperlipidemia type   [place], [place]   2  months ago Pain of right lower extremity   [place], [place], [place]   3 months ago Chronic gout without tophus, unspecified cause, unspecified site   [facility], [place], [place]   4 months ago Leg edema, left   [place], [place], [place]   4 months ago Chronic diastolic heart failure [facility])   [facility] [place], [place]      Future Appointments            In 3 days Orene Desanctis, Lilia Argue, [place], [place]

## 2018-12-29 NOTE — Telephone Encounter (Signed)
tiotropium (SPIRIVA HANDIHALER) 18 MCG inhalation capsule albuterol (VENTOLIN HFA) 108 (90 Base) MCG/ACT inhaler hydrALAZINE (APRESOLINE) 50 MG tablet     Patient is requesting refills.    Pharmacy:  [id number] - [place], [place] [address] [phone] (Phone) [phone] (Fax)

## 2018-12-31 ENCOUNTER — Encounter (INDEPENDENT_AMBULATORY_CARE_PROVIDER_SITE_OTHER): Payer: Self-pay | Admitting: Nurse Practitioner

## 2019-01-01 ENCOUNTER — Other Ambulatory Visit: Payer: Self-pay

## 2019-01-01 ENCOUNTER — Encounter: Payer: Self-pay | Admitting: Family Medicine

## 2019-01-01 ENCOUNTER — Ambulatory Visit (INDEPENDENT_AMBULATORY_CARE_PROVIDER_SITE_OTHER): Payer: Medicaid Other | Admitting: Family Medicine

## 2019-01-01 MED ORDER — ALBUTEROL SULFATE HFA 108 (90 BASE) MCG/ACT IN AERS: 2.0000 | INHALATION_SPRAY | Freq: Four times a day (QID) | RESPIRATORY_TRACT | 1 refills | Status: DC | PRN

## 2019-01-01 NOTE — Progress Notes (Signed)
Pt presented today, and upon chart review the appt was not necessary as 6 month f/u performed 10/2018. No new concerns so pt no charged for visit and asked to come back 04/2019 for next f/u

## 2019-01-04 ENCOUNTER — Emergency Department
Admission: EM | Admit: 2019-01-04 | Discharge: 2019-01-04 | Disposition: A | Discharge status: Home / Self Care | Payer: Medicaid Other | Attending: Emergency Medicine | Admitting: Emergency Medicine

## 2019-01-04 ENCOUNTER — Emergency Department: Payer: Medicaid Other

## 2019-01-04 ENCOUNTER — Other Ambulatory Visit: Payer: Self-pay

## 2019-01-04 ENCOUNTER — Ambulatory Visit (INDEPENDENT_AMBULATORY_CARE_PROVIDER_SITE_OTHER): Payer: Medicaid Other | Admitting: Nurse Practitioner

## 2019-01-04 ENCOUNTER — Encounter: Payer: Self-pay | Admitting: Intensive Care

## 2019-01-04 DIAGNOSIS — R109 Unspecified abdominal pain: Secondary | ICD-10-CM | POA: Insufficient documentation

## 2019-01-04 DIAGNOSIS — I110 Hypertensive heart disease with heart failure: Secondary | ICD-10-CM | POA: Insufficient documentation

## 2019-01-04 DIAGNOSIS — I509 Heart failure, unspecified: Secondary | ICD-10-CM | POA: Insufficient documentation

## 2019-01-04 DIAGNOSIS — I4891 Unspecified atrial fibrillation: Secondary | ICD-10-CM | POA: Diagnosis not present

## 2019-01-04 DIAGNOSIS — Z86711 Personal history of pulmonary embolism: Secondary | ICD-10-CM | POA: Insufficient documentation

## 2019-01-04 DIAGNOSIS — R1010 Upper abdominal pain, unspecified: Secondary | ICD-10-CM

## 2019-01-04 DIAGNOSIS — R0600 Dyspnea, unspecified: Secondary | ICD-10-CM | POA: Insufficient documentation

## 2019-01-04 DIAGNOSIS — J449 Chronic obstructive pulmonary disease, unspecified: Secondary | ICD-10-CM | POA: Diagnosis not present

## 2019-01-04 DIAGNOSIS — Z87891 Personal history of nicotine dependence: Secondary | ICD-10-CM | POA: Insufficient documentation

## 2019-01-04 DIAGNOSIS — R0602 Shortness of breath: Secondary | ICD-10-CM | POA: Diagnosis present

## 2019-01-04 LAB — CBC
HCT: 42.8 % (ref 36.0–46.0)
Hemoglobin: 14 g/dL (ref 12.0–15.0)
MCH: 27.5 pg (ref 26.0–34.0)
MCHC: 32.7 g/dL (ref 30.0–36.0)
MCV: 83.9 fL (ref 80.0–100.0)
Platelets: 301 10*3/uL (ref 150–400)
RBC: 5.1 MIL/uL (ref 3.87–5.11)
RDW: 14.9 % (ref 11.5–15.5)
WBC: 8.6 10*3/uL (ref 4.0–10.5)
nRBC: 0 % (ref 0.0–0.2)

## 2019-01-04 LAB — BASIC METABOLIC PANEL
Anion gap: 14 (ref 5–15)
BUN: 12 mg/dL (ref 6–20)
CO2: 22 mmol/L (ref 22–32)
Calcium: 9.1 mg/dL (ref 8.9–10.3)
Chloride: 104 mmol/L (ref 98–111)
Creatinine, Ser: 0.7 mg/dL (ref 0.44–1.00)
GFR calc Af Amer: >60 mL/min (ref >60)
GFR calc non Af Amer: >60 mL/min (ref >60)
Glucose, Bld: 114 mg/dL — ABNORMAL HIGH (ref 70–99)
Potassium: 3.1 mmol/L — ABNORMAL LOW (ref 3.5–5.1)
Sodium: 140 mmol/L (ref 135–145)

## 2019-01-04 LAB — TROPONIN I (HIGH SENSITIVITY): Troponin I (High Sensitivity): 7 ng/L (ref <18)

## 2019-01-04 MED ORDER — LORAZEPAM 1 MG PO TABS
1.0000 mg | ORAL_TABLET | Freq: Once | ORAL | Status: AC
  Administered 2019-01-04: 1 mg via ORAL
  Filled 2019-01-04: qty 1

## 2019-01-04 MED ORDER — ONDANSETRON 4 MG PO TBDP
4.0000 mg | ORAL_TABLET | Freq: Once | ORAL | Status: AC | PRN
  Administered 2019-01-04: 4 mg via ORAL
  Filled 2019-01-04: qty 1

## 2019-01-04 MED ORDER — LORAZEPAM 0.5 MG PO TABS: 0.5000 mg | ORAL_TABLET | Freq: Three times a day (TID) | ORAL | 0 refills | Status: AC | PRN

## 2019-01-04 NOTE — ED Provider Notes (Signed)
[facility] Emergency Department Provider Note       Time seen: ----------------------------------------- 5:44 PM on 01/04/2019 -----------------------------------------   I have reviewed the triage vital signs and the nursing notes.  HISTORY   Chief Complaint Shortness of Breath and Abdominal Pain (upper)   HPI [name] is a 53 y.o. female with a history of atrial fibrillation, alcohol abuse, CHF, COPD, GERD, hypertension, obesity, PE who presents to the ED for shortness of breath for several days on and off.  She has a history of COPD and CHF, wears intermittent nasal cannula oxygen.  She was also having some upper abdominal pain.  All of her symptoms have resolved at the time my evaluation.  Past Medical History:  Diagnosis Date  . A-fib ([place]) 2010  . Adrenal adenoma, left   . Alcohol abuse   . Allergy   . CHF (congestive heart failure) ([place])   . COPD (chronic obstructive pulmonary disease) ([place])   . Fatty liver   . GERD (gastroesophageal reflux disease)   . Gout   . Hypertension   . Mild cognitive impairment   . Non-healing non-surgical wound    right leg  . Obesity   . Pulmonary embolus ([place])   . Sleep apnea    uses Cpap  . Stroke ([place])   . Thyroid disease     Patient Active Problem List   Diagnosis Date Noted  . Lymphedema 12/06/2018  . IFG (impaired fasting glucose) 11/03/2018  . Hallucinations, visual 07/03/2018  . Unstable angina ([place]) 03/25/2018  . Gout 05/09/2017  . COPD (chronic obstructive pulmonary disease) ([place]) 04/10/2017  . Sleep apnea 04/10/2017  . History of pulmonary embolism 04/10/2017  . History of thyroid disease 04/10/2017  . Hyperlipidemia 12/31/2016  . Essential hypertension 05/26/2016  . Chronic diastolic heart failure ([place]) 05/26/2016  . Hypokalemia 05/26/2016  . A-fib ([place]) 01/26/2008    Past Surgical History:  Procedure Laterality Date  . bilateral wrist fractures  2010  . LEFT HEART CATH AND  CORONARY ANGIOGRAPHY N/A 03/27/2018   Procedure: LEFT HEART CATH AND CORONARY ANGIOGRAPHY;  Surgeon: Isaias Cowman, MD;  Location: [place] [facility];  Service: Cardiovascular;  Laterality: N/A;  . lt ankle fracture  2002    Allergies Morphine and related and Penicillins  Social History Social History   Tobacco Use  . Smoking status: Former Smoker    Types: Cigarettes    Quit date: 01/26/2016    Years since quitting: 2.9  . Smokeless tobacco: Never Used  Substance Use Topics  . Alcohol use: Yes    Comment: Occassionally  . Drug use: No    Review of Systems Constitutional: Negative for fever. Cardiovascular: Negative for chest pain. Respiratory: Positive for shortness of breath Gastrointestinal: Positive for abdominal pain Musculoskeletal: Negative for back pain. Skin: Negative for rash. Neurological: Negative for headaches, focal weakness or numbness.  All systems negative/normal/unremarkable except as stated in the HPI  ____________________________________________   PHYSICAL EXAM:  VITAL SIGNS: ED Triage Vitals  Enc Vitals Group     BP 01/04/19 1445 129/85     Pulse Rate 01/04/19 1445 100     Resp 01/04/19 1445 (!) 22     Temp 01/04/19 1445 98.2 F (36.8 C)     Temp Source 01/04/19 1445 Oral     SpO2 01/04/19 1445 96 %     Weight 01/04/19 1446 244 lb (110.7 kg)     Height 01/04/19 1446 5\' 9"  (1.753 m)  Head Circumference --      Peak Flow --      Pain Score 01/04/19 1446 9     Pain Loc --      Pain Edu? --      Excl. in [place]? --     Constitutional: Alert and oriented. Well appearing and in no distress. Eyes: Conjunctivae are normal. Normal extraocular movements. ENT      Head: Normocephalic and atraumatic.      Nose: No congestion/rhinnorhea.      Mouth/Throat: Mucous membranes are moist.      Neck: No stridor. Cardiovascular: Normal rate, regular rhythm. No murmurs, rubs, or gallops. Respiratory: Normal respiratory effort without tachypnea  nor retractions. Breath sounds are clear and equal bilaterally. No wheezes/rales/rhonchi. Gastrointestinal: Soft and nontender. Normal bowel sounds Musculoskeletal: Nontender with normal range of motion in extremities. No lower extremity tenderness nor edema. Neurologic:  Normal speech and language. No gross focal neurologic deficits are appreciated.  Skin:  Skin is warm, dry and intact. No rash noted. Psychiatric: Mood and affect are normal. Speech and behavior are normal.  ____________________________________________  EKG: Interpreted by me.  Sinus rhythm with PACs, normal PR interval, leftward axis, normal QT  ____________________________________________  ED COURSE:  As part of my medical decision making, I reviewed the following data within the [place] History obtained from family if available, nursing notes, old chart and ekg, as well as notes from prior ED visits. Patient presented for shortness of breath and upper abdominal pain, we will assess with labs and imaging as indicated at this time.   Procedures  [name] was evaluated in Emergency Department on 01/04/2019 for the symptoms described in the history of present illness. She was evaluated in the context of the global COVID-19 pandemic, which necessitated consideration that the patient might be at risk for infection with the SARS-CoV-2 virus that causes COVID-19. Institutional protocols and algorithms that pertain to the evaluation of patients at risk for COVID-19 are in a state of rapid change based on information released by regulatory bodies including the CDC and federal and state organizations. These policies and algorithms were followed during the patient's care in the ED.  ____________________________________________   LABS (pertinent positives/negatives)  Labs Reviewed  BASIC METABOLIC PANEL - Abnormal; Notable for the following components:      Result Value   Potassium 3.1 (*)    Glucose, Bld  114 (*)    All other components within normal limits  CBC  TROPONIN I (HIGH SENSITIVITY)  TROPONIN I (HIGH SENSITIVITY)    RADIOLOGY  Chest x-ray is unremarkable  ____________________________________________   DIFFERENTIAL DIAGNOSIS   Anxiety, CHF, COPD, GERD, peptic ulcer disease  FINAL ASSESSMENT AND PLAN  Dyspnea, upper abdominal pain   Plan: The patient had presented for shortness of breath and upper abdominal pain, the etiology of which is unclear. Patient's labs are unremarkable. Patient's imaging did not reveal any acute process.  She is cleared for outpatient follow-up.   Laurence Aly, MD    Note: This note was generated in part or whole with voice recognition software. Voice recognition is usually quite accurate but there are transcription errors that can and very often do occur. I apologize for any typographical errors that were not detected and corrected.     Earleen Newport, MD 01/04/19 [phone]

## 2019-01-04 NOTE — ED Notes (Signed)
Pt states SOB has improved. Pt offered wheelchair to lobby, pt refused, stated she will sit in lobby and call daughter. Pt offered O2 while she waited as pt states she uses O2 at home, pt refused, pt states she had taken off O2 [place] in room a while ago. Pt's sats 95-97% on room air. Pt ambulated to lobby with steady gait.

## 2019-01-04 NOTE — ED Triage Notes (Addendum)
Patient arrived by EMS from home for SOB Xcouple of days. HX COPD and CHF. Wears O2 at when needed. Also c/o upper abdominal pain. Feels like stomach is in knots

## 2019-01-04 NOTE — ED Triage Notes (Signed)
First RN Note: Pt presents to ED via [facility] with c/o [facility], per EMS pt was going to appt with vein and vascular when she became [facility]. EMS reports upon their arrival pt denies [facility], then c/o scratchy throat to EMS. Per EMS all symptoms x several days.    150/81 78HR 96%

## 2019-01-04 NOTE — ED Notes (Signed)
See triage note. Pt alert and sitting calmly in bed. Resp currently regular/unlabored.

## 2019-01-08 ENCOUNTER — Encounter (INDEPENDENT_AMBULATORY_CARE_PROVIDER_SITE_OTHER): Payer: Self-pay | Admitting: Nurse Practitioner

## 2019-01-08 ENCOUNTER — Ambulatory Visit (INDEPENDENT_AMBULATORY_CARE_PROVIDER_SITE_OTHER): Payer: Medicaid Other | Admitting: Nurse Practitioner

## 2019-01-08 ENCOUNTER — Other Ambulatory Visit: Payer: Self-pay

## 2019-01-08 VITALS — BP 139/100 | HR 85 | Resp 16 | Wt 239.0 lb

## 2019-01-08 DIAGNOSIS — E785 Hyperlipidemia, unspecified: Secondary | ICD-10-CM | POA: Diagnosis not present

## 2019-01-08 DIAGNOSIS — I890 Lymphedema, not elsewhere classified: Secondary | ICD-10-CM

## 2019-01-08 DIAGNOSIS — I10 Essential (primary) hypertension: Secondary | ICD-10-CM | POA: Diagnosis not present

## 2019-01-08 NOTE — Progress Notes (Signed)
SUBJECTIVE:  Patient ID: [name], female    DOB: 10-05-1965, 53 y.o.   MRN: [id number] Chief Complaint  Patient presents with  . Follow-up    unna check    HPI  OLLA Randall is a 53 y.o. female that presents to the office for evaluation of lower extremity edema after 4 weeks of Unna wraps.  The edema has greatly improved however the patient still continues to have some small ulcerations.  She states that her legs have felt much better with the bilateral Unna wraps.  She continues to elevate her lower extremities as much as possible as well as ambulate.  She denies any fever, chills, nausea, vomiting or diarrhea.  She denies any chest pain or shortness of breath.  Past Medical History:  Diagnosis Date  . A-fib ([place]) 2010  . Adrenal adenoma, left   . Alcohol abuse   . Allergy   . CHF (congestive heart failure) ([place])   . COPD (chronic obstructive pulmonary disease) ([place])   . Fatty liver   . GERD (gastroesophageal reflux disease)   . Gout   . Hypertension   . Mild cognitive impairment   . Non-healing non-surgical wound    right leg  . Obesity   . Pulmonary embolus ([place])   . Sleep apnea    uses Cpap  . Stroke ([place])   . Thyroid disease     Past Surgical History:  Procedure Laterality Date  . bilateral wrist fractures  2010  . LEFT HEART CATH AND CORONARY ANGIOGRAPHY N/A 03/27/2018   Procedure: LEFT HEART CATH AND CORONARY ANGIOGRAPHY;  Surgeon: Isaias Cowman, MD;  Location: [facility];  Service: Cardiovascular;  Laterality: N/A;  . lt ankle fracture  2002    Social History   Socioeconomic History  . Marital status: Legally Separated    Spouse name: Not on file  . Number of children: Not on file  . Years of education: Not on file  . Highest education level: Not on file  Occupational History  . Not on file  Tobacco Use  . Smoking status: Former Smoker    Types: Cigarettes    Quit date: 01/26/2016    Years since quitting: 2.9  . Smokeless  tobacco: Never Used  Substance and Sexual Activity  . Alcohol use: Yes    Comment: Occassionally  . Drug use: No  . Sexual activity: Never  Other Topics Concern  . Not on file  Social History Narrative  . Not on file   Social Determinants of Health   Financial Resource Strain:   . Difficulty of Paying Living Expenses: Not on file  Food Insecurity:   . Worried About Charity fundraiser in the Last Year: Not on file  . Ran Out of Food in the Last Year: Not on file  Transportation Needs:   . Lack of Transportation (Medical): Not on file  . Lack of Transportation (Non-Medical): Not on file  Physical Activity:   . Days of Exercise per Week: Not on file  . Minutes of Exercise per Session: Not on file  Stress:   . Feeling of Stress : Not on file  Social Connections:   . Frequency of Communication with Friends and Family: Not on file  . Frequency of Social Gatherings with Friends and Family: Not on file  . Attends Religious Services: Not on file  . Active Member of Clubs or Organizations: Not on file  . Attends Archivist Meetings: Not  on file  . Marital Status: Not on file  Intimate Partner Violence:   . Fear of Current or Ex-Partner: Not on file  . Emotionally Abused: Not on file  . Physically Abused: Not on file  . Sexually Abused: Not on file    Family History  Problem Relation Age of Onset  . Prostate cancer Father   . Rectal cancer Sister   . Breast cancer Sister   . Cancer Maternal Grandmother   . Cancer Maternal Grandfather     Allergies  Allergen Reactions  . Morphine And Related Hives  . Penicillins Other (See Comments)    Painful urination Has patient had a PCN reaction causing immediate rash, facial/tongue/throat swelling, SOB or lightheadedness with hypotension: yes Has patient had a PCN reaction causing severe rash involving mucus membranes or skin necrosis: no Has patient had a PCN reaction that required hospitalization no Has patient had a  PCN reaction occurring within the last 10 years: no If all of the above answers are "NO", then may proceed with Cephalosporin use.      Review of Systems   Review of Systems: Negative Unless Checked Constitutional: [] Weight loss  [] Fever  [] Chills Cardiac: [] Chest pain   [x]  Atrial Fibrillation  [] Palpitations   [] Shortness of breath when laying flat   [] Shortness of breath with exertion. [] Shortness of breath at rest Vascular:  [] Pain in legs with walking   [] Pain in legs with standing [] Pain in legs when laying flat   [] Claudication    [] Pain in feet when laying flat    [] History of DVT   [] Phlebitis   [x] Swelling in legs   [] Varicose veins   [] Non-healing ulcers Pulmonary:   [] Uses home oxygen   [] Productive cough   [] Hemoptysis   [] Wheeze  [x] COPD   [] Asthma Neurologic:  [] Dizziness   [] Seizures  [] Blackouts [] History of stroke   [] History of TIA  [] Aphasia   [] Temporary Blindness   [] Weakness or numbness in arm   [] Weakness or numbness in leg Musculoskeletal:   [] Joint swelling   [] Joint pain   [] Low back pain  []  History of Knee Replacement [] Arthritis [] back Surgeries  []  Spinal Stenosis    Hematologic:  [] Easy bruising  [] Easy bleeding   [] Hypercoagulable state   [] Anemic Gastrointestinal:  [] Diarrhea   [] Vomiting  [] Gastroesophageal reflux/heartburn   [] Difficulty swallowing. [] Abdominal pain Genitourinary:  [] Chronic kidney disease   [] Difficult urination  [] Anuric   [] Blood in urine [] Frequent urination  [] Burning with urination   [] Hematuria Skin:  [] Rashes   [] Ulcers [x] Wounds Psychological:  [] History of anxiety   []  History of major depression  []  Memory Difficulties      OBJECTIVE:   Physical Exam  BP (!) 139/100 (BP Location: Right Arm)   Pulse 85   Resp 16   Wt 239 lb (108.4 kg)   BMI 35.29 kg/m   Gen: WD/WN, NAD Head: /AT, No temporalis wasting.  Ear/Nose/Throat: Hearing grossly intact, nares w/o erythema or drainage Eyes: PER, EOMI, sclera nonicteric.  Neck:  Supple, no masses.  No JVD.  Pulmonary:  Good air movement, no use of accessory muscles.  Cardiac: RRR Vascular:  2+ nonpitting edema bilaterally Vessel Right Left  Dorsalis Pedis Palpable Palpable  Posterior Tibial Palpable Palpable   Gastrointestinal: soft, non-distended. No guarding/no peritoneal signs.  Musculoskeletal: M/S 5/5 throughout.  No deformity or atrophy.  Neurologic: Pain and light touch intact in extremities.  Symmetrical.  Speech is fluent. Motor exam as listed above. Psychiatric: Judgment intact, Mood &  affect appropriate for pt's clinical situation. Dermatologic:  Bilateral stasis dermatitis. No Ulcers Noted.  No changes consistent with cellulitis. Lymph : No Cervical lymphadenopathy, dermal thickening bilaterally       ASSESSMENT AND PLAN:  1. Lymphedema Recommend:  Patient continues to have a small venous ulceration and so we will continue to have the patient in bilateral Unna wraps with the intention to transition to medical grade 1 compression stockings.  She will present to the office on a weekly basis for dressing changes with evaluation in 6 weeks. No surgery or intervention at this point in time.    I have reviewed my previous discussion with the patient regarding swelling and why it causes symptoms.  Patient will continue wearing graduated compression stockings class 1 (20-30 mmHg) on a daily basis. The patient will  beginning wearing the stockings first thing in the morning and removing them in the evening. The patient is instructed specifically not to sleep in the stockings.    In addition, behavioral modification including several periods of elevation of the lower extremities during the day will be continued.  This was reviewed with the patient during the initial visit.  The patient will also continue routine exercise, especially walking on a daily basis as was discussed during the initial visit.    Despite conservative treatments for at least 4 weeks,  including graduated compression therapy class 1 and behavioral modification including exercise and elevation the patient  has not obtained adequate control of the lymphedema.  The patient still has stage 3 lymphedema and therefore, I believe that a lymph pump should be added to improve the control of the patient's lymphedema.  Additionally, a lymph pump is warranted because it will reduce the risk of cellulitis and ulceration in the future.  Patient should follow-up in six weeks  2. Hyperlipidemia, unspecified hyperlipidemia type Continue statin as ordered and reviewed, no changes at this time   3. Essential hypertension Continue antihypertensive medications as already ordered, these medications have been reviewed and there are no changes at this time.    Current Outpatient Medications on File Prior to Visit  Medication Sig Dispense Refill  . albuterol (VENTOLIN HFA) 108 (90 Base) MCG/ACT inhaler Inhale 2 puffs into the lungs every 6 (six) hours as needed for wheezing or shortness of breath. 18 g 1  . allopurinol (ZYLOPRIM) 300 MG tablet Take 1 tablet (300 mg total) by mouth daily. 30 tablet 6  . amLODipine (NORVASC) 10 MG tablet Take 1 tablet (10 mg total) by mouth daily. 90 tablet 1  . aspirin EC 81 MG tablet Take 81 mg by mouth daily.    Marland Kitchen atorvastatin (LIPITOR) 40 MG tablet Take 1 tablet (40 mg total) by mouth daily. 90 tablet 1  . cloNIDine (CATAPRES) 0.1 MG tablet Take 1 tablet (0.1 mg total) by mouth 2 (two) times daily. 60 tablet 6  . colchicine 0.6 MG tablet Take 1 tablet (0.6 mg total) by mouth 2 (two) times daily. As needed for gout flares 30 tablet 1  . gabapentin (NEURONTIN) 300 MG capsule Take 1 capsule (300 mg total) by mouth 2 (two) times daily. 60 capsule 5  . hydrALAZINE (APRESOLINE) 50 MG tablet Take 1 tablet (50 mg total) by mouth 3 (three) times daily. 90 tablet 0  . lisinopril (ZESTRIL) 20 MG tablet TAKE 1 TABLET BY MOUTH EVERY DAY 30 tablet 5  . LORazepam (ATIVAN) 0.5  MG tablet Take 1 tablet (0.5 mg total) by mouth every 8 (eight) hours  as needed for anxiety. 30 tablet 0  . metoprolol (TOPROL XL) 200 MG 24 hr tablet Take 1 tablet (200 mg total) by mouth daily. 90 tablet 3  . ondansetron (ZOFRAN ODT) 4 MG disintegrating tablet Take 1 tablet (4 mg total) by mouth every 8 (eight) hours as needed. 10 tablet 0  . pantoprazole (PROTONIX) 40 MG tablet TAKE 1 TABLET BY MOUTH EVERY DAY 90 tablet 0  . potassium chloride SA (KLOR-CON M20) 20 MEQ tablet Take 1 tablet (20 mEq total) by mouth 2 (two) times daily. 60 tablet 5  . sertraline (ZOLOFT) 100 MG tablet Take 1 tablet (100 mg total) by mouth daily. 90 tablet 1  . tiotropium (SPIRIVA HANDIHALER) 18 MCG inhalation capsule Place 1 capsule (18 mcg total) into inhaler and inhale daily. 30 capsule 0  . furosemide (LASIX) 40 MG tablet Take 1 tablet (40 mg total) by mouth daily. 90 tablet 3  . potassium chloride (KLOR-CON) 10 MEQ tablet Take 1 tablet (10 mEq total) by mouth daily for 5 days. 5 tablet 0   No current facility-administered medications on file prior to visit.    There are no Patient Instructions on file for this visit. No follow-ups on file.   Kris Hartmann, NP  This note was completed with Sales executive.  Any errors are purely unintentional.

## 2019-01-15 ENCOUNTER — Other Ambulatory Visit: Payer: Self-pay

## 2019-01-15 ENCOUNTER — Encounter (INDEPENDENT_AMBULATORY_CARE_PROVIDER_SITE_OTHER): Payer: Self-pay | Admitting: Nurse Practitioner

## 2019-01-15 ENCOUNTER — Ambulatory Visit (INDEPENDENT_AMBULATORY_CARE_PROVIDER_SITE_OTHER): Payer: Medicaid Other | Admitting: Nurse Practitioner

## 2019-01-15 VITALS — BP 145/98 | HR 93 | Resp 17

## 2019-01-15 DIAGNOSIS — I890 Lymphedema, not elsewhere classified: Secondary | ICD-10-CM | POA: Diagnosis not present

## 2019-01-15 NOTE — Progress Notes (Signed)
History of Present Illness  There is no documented history at this time  Assessments & Plan   There are no diagnoses linked to this encounter.    Additional instructions  Subjective:  Patient presents with venous ulcer of the Bilateral lower extremity.    Procedure:  3 layer unna wrap was placed Bilateral lower extremity.   Plan:   Follow up in one week.  

## 2019-01-21 ENCOUNTER — Encounter (INDEPENDENT_AMBULATORY_CARE_PROVIDER_SITE_OTHER): Payer: Self-pay | Admitting: Nurse Practitioner

## 2019-01-22 ENCOUNTER — Ambulatory Visit (INDEPENDENT_AMBULATORY_CARE_PROVIDER_SITE_OTHER): Payer: Medicaid Other | Admitting: Nurse Practitioner

## 2019-01-22 ENCOUNTER — Other Ambulatory Visit: Payer: Self-pay

## 2019-01-22 ENCOUNTER — Encounter (INDEPENDENT_AMBULATORY_CARE_PROVIDER_SITE_OTHER): Payer: Self-pay

## 2019-01-22 VITALS — BP 151/98 | HR 79 | Resp 16 | Wt 246.4 lb

## 2019-01-22 DIAGNOSIS — I890 Lymphedema, not elsewhere classified: Secondary | ICD-10-CM | POA: Diagnosis not present

## 2019-01-22 NOTE — Progress Notes (Signed)
History of Present Illness  There is no documented history at this time  Assessments & Plan   There are no diagnoses linked to this encounter.    Additional instructions  Subjective:  Patient presents with venous ulcer of the Bilateral lower extremity.    Procedure:  3 layer unna wrap was placed Bilateral lower extremity.   Plan:   Follow up in one week.  

## 2019-01-23 ENCOUNTER — Encounter (INDEPENDENT_AMBULATORY_CARE_PROVIDER_SITE_OTHER): Payer: Self-pay | Admitting: Nurse Practitioner

## 2019-01-29 ENCOUNTER — Encounter (INDEPENDENT_AMBULATORY_CARE_PROVIDER_SITE_OTHER): Payer: Self-pay | Admitting: Nurse Practitioner

## 2019-01-29 ENCOUNTER — Other Ambulatory Visit: Payer: Self-pay

## 2019-01-29 ENCOUNTER — Ambulatory Visit (INDEPENDENT_AMBULATORY_CARE_PROVIDER_SITE_OTHER): Payer: Medicaid Other | Admitting: Nurse Practitioner

## 2019-01-29 VITALS — BP 176/76 | HR 75 | Resp 16 | Wt 261.0 lb

## 2019-01-29 DIAGNOSIS — I890 Lymphedema, not elsewhere classified: Secondary | ICD-10-CM | POA: Diagnosis not present

## 2019-01-29 NOTE — Progress Notes (Signed)
History of Present Illness  There is no documented history at this time  Assessments & Plan   There are no diagnoses linked to this encounter.    Additional instructions  Subjective:  Patient presents with venous ulcer of the Bilateral lower extremity.    Procedure:  3 layer unna wrap was placed Bilateral lower extremity.   Plan:   Follow up in one week.  

## 2019-01-31 ENCOUNTER — Encounter: Payer: Self-pay | Admitting: Student in an Organized Health Care Education/Training Program
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
## 2019-02-01 ENCOUNTER — Ambulatory Visit
Payer: Medicaid Other | Attending: Student in an Organized Health Care Education/Training Program | Admitting: Student in an Organized Health Care Education/Training Program

## 2019-02-01 ENCOUNTER — Other Ambulatory Visit: Payer: Self-pay

## 2019-02-01 ENCOUNTER — Encounter: Payer: Self-pay | Admitting: Student in an Organized Health Care Education/Training Program

## 2019-02-01 VITALS — Ht 69.0 in | Wt 239.0 lb

## 2019-02-01 DIAGNOSIS — Z86711 Personal history of pulmonary embolism: Secondary | ICD-10-CM

## 2019-02-01 DIAGNOSIS — I4891 Unspecified atrial fibrillation: Secondary | ICD-10-CM

## 2019-02-01 DIAGNOSIS — G894 Chronic pain syndrome: Secondary | ICD-10-CM

## 2019-02-01 DIAGNOSIS — M1A9XX0 Chronic gout, unspecified, without tophus (tophi): Secondary | ICD-10-CM

## 2019-02-01 DIAGNOSIS — I890 Lymphedema, not elsewhere classified: Secondary | ICD-10-CM

## 2019-02-01 DIAGNOSIS — G4733 Obstructive sleep apnea (adult) (pediatric): Secondary | ICD-10-CM | POA: Diagnosis not present

## 2019-02-01 DIAGNOSIS — J449 Chronic obstructive pulmonary disease, unspecified: Secondary | ICD-10-CM

## 2019-02-01 MED ORDER — GABAPENTIN 300 MG PO CAPS: ORAL_CAPSULE | ORAL | 5 refills | Status: DC

## 2019-02-05 ENCOUNTER — Encounter (INDEPENDENT_AMBULATORY_CARE_PROVIDER_SITE_OTHER): Payer: Self-pay

## 2019-02-05 ENCOUNTER — Other Ambulatory Visit: Payer: Self-pay

## 2019-02-05 ENCOUNTER — Ambulatory Visit (INDEPENDENT_AMBULATORY_CARE_PROVIDER_SITE_OTHER): Payer: Medicaid Other | Admitting: Nurse Practitioner

## 2019-02-05 VITALS — BP 184/132 | HR 76 | Resp 16 | Wt 242.0 lb

## 2019-02-05 DIAGNOSIS — I890 Lymphedema, not elsewhere classified: Secondary | ICD-10-CM | POA: Diagnosis not present

## 2019-02-05 NOTE — Progress Notes (Signed)
History of Present Illness  There is no documented history at this time  Assessments & Plan   There are no diagnoses linked to this encounter.    Additional instructions  Subjective:  Patient presents with venous ulcer of the Bilateral lower extremity.    Procedure:  3 layer unna wrap was placed Bilateral lower extremity.   Plan:   Follow up in one week.  

## 2019-02-12 ENCOUNTER — Other Ambulatory Visit: Payer: Self-pay

## 2019-02-12 ENCOUNTER — Encounter (INDEPENDENT_AMBULATORY_CARE_PROVIDER_SITE_OTHER): Payer: Self-pay | Admitting: Nurse Practitioner

## 2019-02-12 ENCOUNTER — Ambulatory Visit (INDEPENDENT_AMBULATORY_CARE_PROVIDER_SITE_OTHER): Payer: Medicaid Other | Admitting: Nurse Practitioner

## 2019-02-12 VITALS — BP 132/93 | HR 91 | Resp 16 | Wt 237.0 lb

## 2019-02-12 DIAGNOSIS — I890 Lymphedema, not elsewhere classified: Secondary | ICD-10-CM

## 2019-02-12 DIAGNOSIS — E785 Hyperlipidemia, unspecified: Secondary | ICD-10-CM | POA: Diagnosis not present

## 2019-02-12 DIAGNOSIS — I10 Essential (primary) hypertension: Secondary | ICD-10-CM | POA: Diagnosis not present

## 2019-02-12 NOTE — Progress Notes (Signed)
SUBJECTIVE:  Patient ID: [name], female    DOB: 1965-12-14, 54 y.o.   MRN: [id number] Chief Complaint  Patient presents with  . Follow-up    6week unna check    HPI  [name] is a 54 y.o. female that presents today for follow-up evaluation of bilateral Unna wraps.  Today the patient has minimal edema that she states that her legs feel really good compared to how they felt previously.  She states that the pain is nearly gone.  She also endorses receiving her lymphedema pump about a day or so ago.  She is eager to begin using it on a regular basis.  She denies any fever, chills, nausea, vomiting or diarrhea.  She denies any chest pain or shortness of breath.  Past Medical History:  Diagnosis Date  . A-fib ([place]) 2010  . Adrenal adenoma, left   . Alcohol abuse   . Allergy   . CHF (congestive heart failure) ([place])   . COPD (chronic obstructive pulmonary disease) ([place])   . Fatty liver   . GERD (gastroesophageal reflux disease)   . Gout   . Hypertension   . Mild cognitive impairment   . Non-healing non-surgical wound    right leg  . Obesity   . Pulmonary embolus ([place])   . Sleep apnea    uses Cpap  . Stroke ([place])   . Thyroid disease     Past Surgical History:  Procedure Laterality Date  . bilateral wrist fractures  2010  . LEFT HEART CATH AND CORONARY ANGIOGRAPHY N/A 03/27/2018   Procedure: LEFT HEART CATH AND CORONARY ANGIOGRAPHY;  Surgeon: Isaias Cowman, MD;  Location: [facility];  Service: Cardiovascular;  Laterality: N/A;  . lt ankle fracture  2002    Social History   Socioeconomic History  . Marital status: Legally Separated    Spouse name: Not on file  . Number of children: Not on file  . Years of education: Not on file  . Highest education level: Not on file  Occupational History  . Not on file  Tobacco Use  . Smoking status: Former Smoker    Types: Cigarettes    Quit date: 01/26/2016    Years since quitting: 3.0  . Smokeless  tobacco: Never Used  Substance and Sexual Activity  . Alcohol use: Yes    Comment: Occassionally  . Drug use: No  . Sexual activity: Never  Other Topics Concern  . Not on file  Social History Narrative  . Not on file   Social Determinants of Health   Financial Resource Strain:   . Difficulty of Paying Living Expenses: Not on file  Food Insecurity:   . Worried About Charity fundraiser in the Last Year: Not on file  . Ran Out of Food in the Last Year: Not on file  Transportation Needs:   . Lack of Transportation (Medical): Not on file  . Lack of Transportation (Non-Medical): Not on file  Physical Activity:   . Days of Exercise per Week: Not on file  . Minutes of Exercise per Session: Not on file  Stress:   . Feeling of Stress : Not on file  Social Connections:   . Frequency of Communication with Friends and Family: Not on file  . Frequency of Social Gatherings with Friends and Family: Not on file  . Attends Religious Services: Not on file  . Active Member of Clubs or Organizations: Not on file  . Attends Club  or Organization Meetings: Not on file  . Marital Status: Not on file  Intimate Partner Violence:   . Fear of Current or Ex-Partner: Not on file  . Emotionally Abused: Not on file  . Physically Abused: Not on file  . Sexually Abused: Not on file    Family History  Problem Relation Age of Onset  . Prostate cancer Father   . Rectal cancer Sister   . Breast cancer Sister   . Cancer Maternal Grandmother   . Cancer Maternal Grandfather     Allergies  Allergen Reactions  . Morphine And Related Hives  . Penicillins Other (See Comments)    Painful urination Has patient had a PCN reaction causing immediate rash, facial/tongue/throat swelling, SOB or lightheadedness with hypotension: yes Has patient had a PCN reaction causing severe rash involving mucus membranes or skin necrosis: no Has patient had a PCN reaction that required hospitalization no Has patient had a  PCN reaction occurring within the last 10 years: no If all of the above answers are "NO", then may proceed with Cephalosporin use.      Review of Systems   Review of Systems: Negative Unless Checked Constitutional: [] Weight loss  [] Fever  [] Chills Cardiac: [] Chest pain   []  Atrial Fibrillation  [] Palpitations   [] Shortness of breath when laying flat   [] Shortness of breath with exertion. [] Shortness of breath at rest Vascular:  [] Pain in legs with walking   [] Pain in legs with standing [] Pain in legs when laying flat   [] Claudication    [] Pain in feet when laying flat    [] History of DVT   [] Phlebitis   [] Swelling in legs   [] Varicose veins   [] Non-healing ulcers Pulmonary:   [] Uses home oxygen   [] Productive cough   [] Hemoptysis   [] Wheeze  [x] COPD   [] Asthma Neurologic:  [] Dizziness   [] Seizures  [] Blackouts [x] History of stroke   [] History of TIA  [] Aphasia   [] Temporary Blindness   [] Weakness or numbness in arm   [] Weakness or numbness in leg Musculoskeletal:   [] Joint swelling   [] Joint pain   [] Low back pain  []  History of Knee Replacement [] Arthritis [] back Surgeries  []  Spinal Stenosis    Hematologic:  [] Easy bruising  [] Easy bleeding   [] Hypercoagulable state   [] Anemic Gastrointestinal:  [] Diarrhea   [] Vomiting  [x] Gastroesophageal reflux/heartburn   [] Difficulty swallowing. [] Abdominal pain Genitourinary:  [] Chronic kidney disease   [] Difficult urination  [] Anuric   [] Blood in urine [] Frequent urination  [] Burning with urination   [] Hematuria Skin:  [] Rashes   [] Ulcers [] Wounds Psychological:  [] History of anxiety   []  History of major depression  []  Memory Difficulties      OBJECTIVE:   Physical Exam  BP (!) 132/93 (BP Location: Right Arm)   Pulse 91   Resp 16   Wt 237 lb (107.5 kg)   BMI 35.00 kg/m   Gen: WD/WN, NAD Head: [place]/AT, No temporalis wasting.  Ear/Nose/Throat: Hearing grossly intact, nares w/o erythema or drainage Eyes: PER, EOMI, sclera nonicteric.  Neck:  Supple, no masses.  No JVD.  Pulmonary:  Good air movement, no use of accessory muscles.  Cardiac: RRR Vascular: minimal edema bilaterally  Vessel Right Left  Dorsalis Pedis Palpable Palpable  Posterior Tibial Palpable Palpable   Gastrointestinal: soft, non-distended. No guarding/no peritoneal signs.  Musculoskeletal: M/S 5/5 throughout.  No deformity or atrophy.  Neurologic: Pain and light touch intact in extremities.  Symmetrical.  Speech is fluent. Motor exam as listed above. Psychiatric:  Judgment intact, Mood & affect appropriate for pt's clinical situation. Dermatologic: No Venous rashes. No Ulcers Noted.  No changes consistent with cellulitis. Lymph : No Cervical lymphadenopathy, no lichenification or skin changes of chronic lymphedema.       ASSESSMENT AND PLAN:  1. Lymphedema The patient has recently received her lymphedema pump approximately 2 days ago.  Typically we have the patient return in 6 months however the patient has had significant issues with lower extremity edema.  Therefore we will have the patient transition to medical grade 1 compression stockings to be worn every day with elevation and exercise.  She will also begin to utilize her lymphedema pump twice a day for at least an hour.  Due to the significant pain that the patient had we will have her return in 6 weeks to evaluate her progress and if necessary we will place her back in [place] wraps.  The patient is also advised that if she needs to be seen sooner she should contact her office to be seen.  2. Essential hypertension Continue antihypertensive medications as already ordered, these medications have been reviewed and there are no changes at this time.   3. Hyperlipidemia, unspecified hyperlipidemia type Continue statin as ordered and reviewed, no changes at this time    Current Outpatient Medications on File Prior to Visit  Medication Sig Dispense Refill  . albuterol (VENTOLIN HFA) 108 (90 Base) MCG/ACT  inhaler Inhale 2 puffs into the lungs every 6 (six) hours as needed for wheezing or shortness of breath. 18 g 1  . allopurinol (ZYLOPRIM) 300 MG tablet Take 1 tablet (300 mg total) by mouth daily. 30 tablet 6  . amLODipine (NORVASC) 10 MG tablet Take 1 tablet (10 mg total) by mouth daily. 90 tablet 1  . aspirin EC 81 MG tablet Take 81 mg by mouth daily.    Marland Kitchen atorvastatin (LIPITOR) 40 MG tablet Take 1 tablet (40 mg total) by mouth daily. 90 tablet 1  . cloNIDine (CATAPRES) 0.1 MG tablet Take 1 tablet (0.1 mg total) by mouth 2 (two) times daily. 60 tablet 6  . colchicine 0.6 MG tablet Take 1 tablet (0.6 mg total) by mouth 2 (two) times daily. As needed for gout flares 30 tablet 1  . gabapentin (NEURONTIN) 300 MG capsule 300 mg qAM, 600 mg qhs 90 capsule 5  . hydrALAZINE (APRESOLINE) 50 MG tablet Take 1 tablet (50 mg total) by mouth 3 (three) times daily. 90 tablet 0  . lisinopril (ZESTRIL) 20 MG tablet TAKE 1 TABLET BY MOUTH EVERY DAY 30 tablet 5  . LORazepam (ATIVAN) 0.5 MG tablet Take 1 tablet (0.5 mg total) by mouth every 8 (eight) hours as needed for anxiety. 30 tablet 0  . metoprolol (TOPROL XL) 200 MG 24 hr tablet Take 1 tablet (200 mg total) by mouth daily. 90 tablet 3  . ondansetron (ZOFRAN ODT) 4 MG disintegrating tablet Take 1 tablet (4 mg total) by mouth every 8 (eight) hours as needed. 10 tablet 0  . pantoprazole (PROTONIX) 40 MG tablet TAKE 1 TABLET BY MOUTH EVERY DAY 90 tablet 0  . potassium chloride SA (KLOR-CON M20) 20 MEQ tablet Take 1 tablet (20 mEq total) by mouth 2 (two) times daily. 60 tablet 5  . sertraline (ZOLOFT) 100 MG tablet Take 1 tablet (100 mg total) by mouth daily. 90 tablet 1  . tiotropium (SPIRIVA HANDIHALER) 18 MCG inhalation capsule Place 1 capsule (18 mcg total) into inhaler and inhale daily. 30 capsule 0  .  furosemide (LASIX) 40 MG tablet Take 1 tablet (40 mg total) by mouth daily. 90 tablet 3  . potassium chloride (KLOR-CON) 10 MEQ tablet Take 1 tablet (10 mEq  total) by mouth daily for 5 days. 5 tablet 0   No current facility-administered medications on file prior to visit.    There are no Patient Instructions on file for this visit. No follow-ups on file.   Kris Hartmann, NP  This note was completed with Sales executive.  Any errors are purely unintentional.

## 2019-02-19 ENCOUNTER — Telehealth (INDEPENDENT_AMBULATORY_CARE_PROVIDER_SITE_OTHER): Payer: Self-pay

## 2019-02-19 NOTE — Telephone Encounter (Signed)
Patient left a message stating that she has been having bilateral leg swelling since her last visit. The patient stated that she has been using her lymphedema pump and wearing compression stockings. I spoke with Eulogio Ditch NP and she informed that the patient can be schedule for 3-4week unna and see provider.

## 2019-02-21 ENCOUNTER — Other Ambulatory Visit: Payer: Self-pay

## 2019-02-21 ENCOUNTER — Ambulatory Visit (INDEPENDENT_AMBULATORY_CARE_PROVIDER_SITE_OTHER): Payer: Medicaid Other | Admitting: Nurse Practitioner

## 2019-02-21 ENCOUNTER — Encounter (INDEPENDENT_AMBULATORY_CARE_PROVIDER_SITE_OTHER): Payer: Self-pay

## 2019-02-21 VITALS — BP 155/111 | HR 64 | Resp 16 | Wt 240.0 lb

## 2019-02-21 DIAGNOSIS — I890 Lymphedema, not elsewhere classified: Secondary | ICD-10-CM

## 2019-02-21 NOTE — Progress Notes (Signed)
History of Present Illness  There is no documented history at this time  Assessments & Plan   There are no diagnoses linked to this encounter.    Additional instructions  Subjective:  Patient presents with venous ulcer of the Bilateral lower extremity.    Procedure:  3 layer unna wrap was placed Bilateral lower extremity.   Plan:   Follow up in one week.  

## 2019-02-23 ENCOUNTER — Encounter (INDEPENDENT_AMBULATORY_CARE_PROVIDER_SITE_OTHER): Payer: Self-pay | Admitting: Nurse Practitioner

## 2019-02-28 ENCOUNTER — Encounter (INDEPENDENT_AMBULATORY_CARE_PROVIDER_SITE_OTHER): Payer: Medicaid Other

## 2019-03-07 ENCOUNTER — Encounter (INDEPENDENT_AMBULATORY_CARE_PROVIDER_SITE_OTHER): Payer: Medicaid Other

## 2019-03-09 ENCOUNTER — Other Ambulatory Visit: Payer: Self-pay | Admitting: Family Medicine

## 2019-03-09 NOTE — Telephone Encounter (Signed)
Requested Prescriptions  Pending Prescriptions Disp Refills  . pantoprazole (PROTONIX) 40 MG tablet [Pharmacy Med Name: PANTOPRAZOLE SOD DR 40 MG TAB] 90 tablet 0    Sig: TAKE 1 TABLET BY MOUTH EVERY DAY     Gastroenterology: Proton Pump Inhibitors Passed - 03/09/2019 12:44 PM      Passed - Valid encounter within last 12 months    Recent Outpatient Visits          2 months ago Erroneous encounter - disregard   [facility] Volney American, PA-C   4 months ago Hyperlipidemia, unspecified hyperlipidemia type   [place], [place]   4 months ago Pain of right lower extremity   [place], [place], [place]   5 months ago Chronic gout without tophus, unspecified cause, unspecified site   [facility], [place], [place]   7 months ago Leg edema, left   [facility] [place], [place]ilia Arg[place], [place]      Future Appointments            In 3 months Orene Desanctis, [place] Argue, [place], [place]

## 2019-03-14 ENCOUNTER — Ambulatory Visit (INDEPENDENT_AMBULATORY_CARE_PROVIDER_SITE_OTHER): Payer: Medicaid Other | Admitting: Nurse Practitioner

## 2019-03-19 IMAGING — CR DG CHEST 2V
2 series · 2 of 2 positions shown · non-contrast
Comparison: Radiograph 05/22/2016

CLINICAL DATA: Shortness of breath.

EXAM:
CHEST - 2 VIEW

[chest lat]
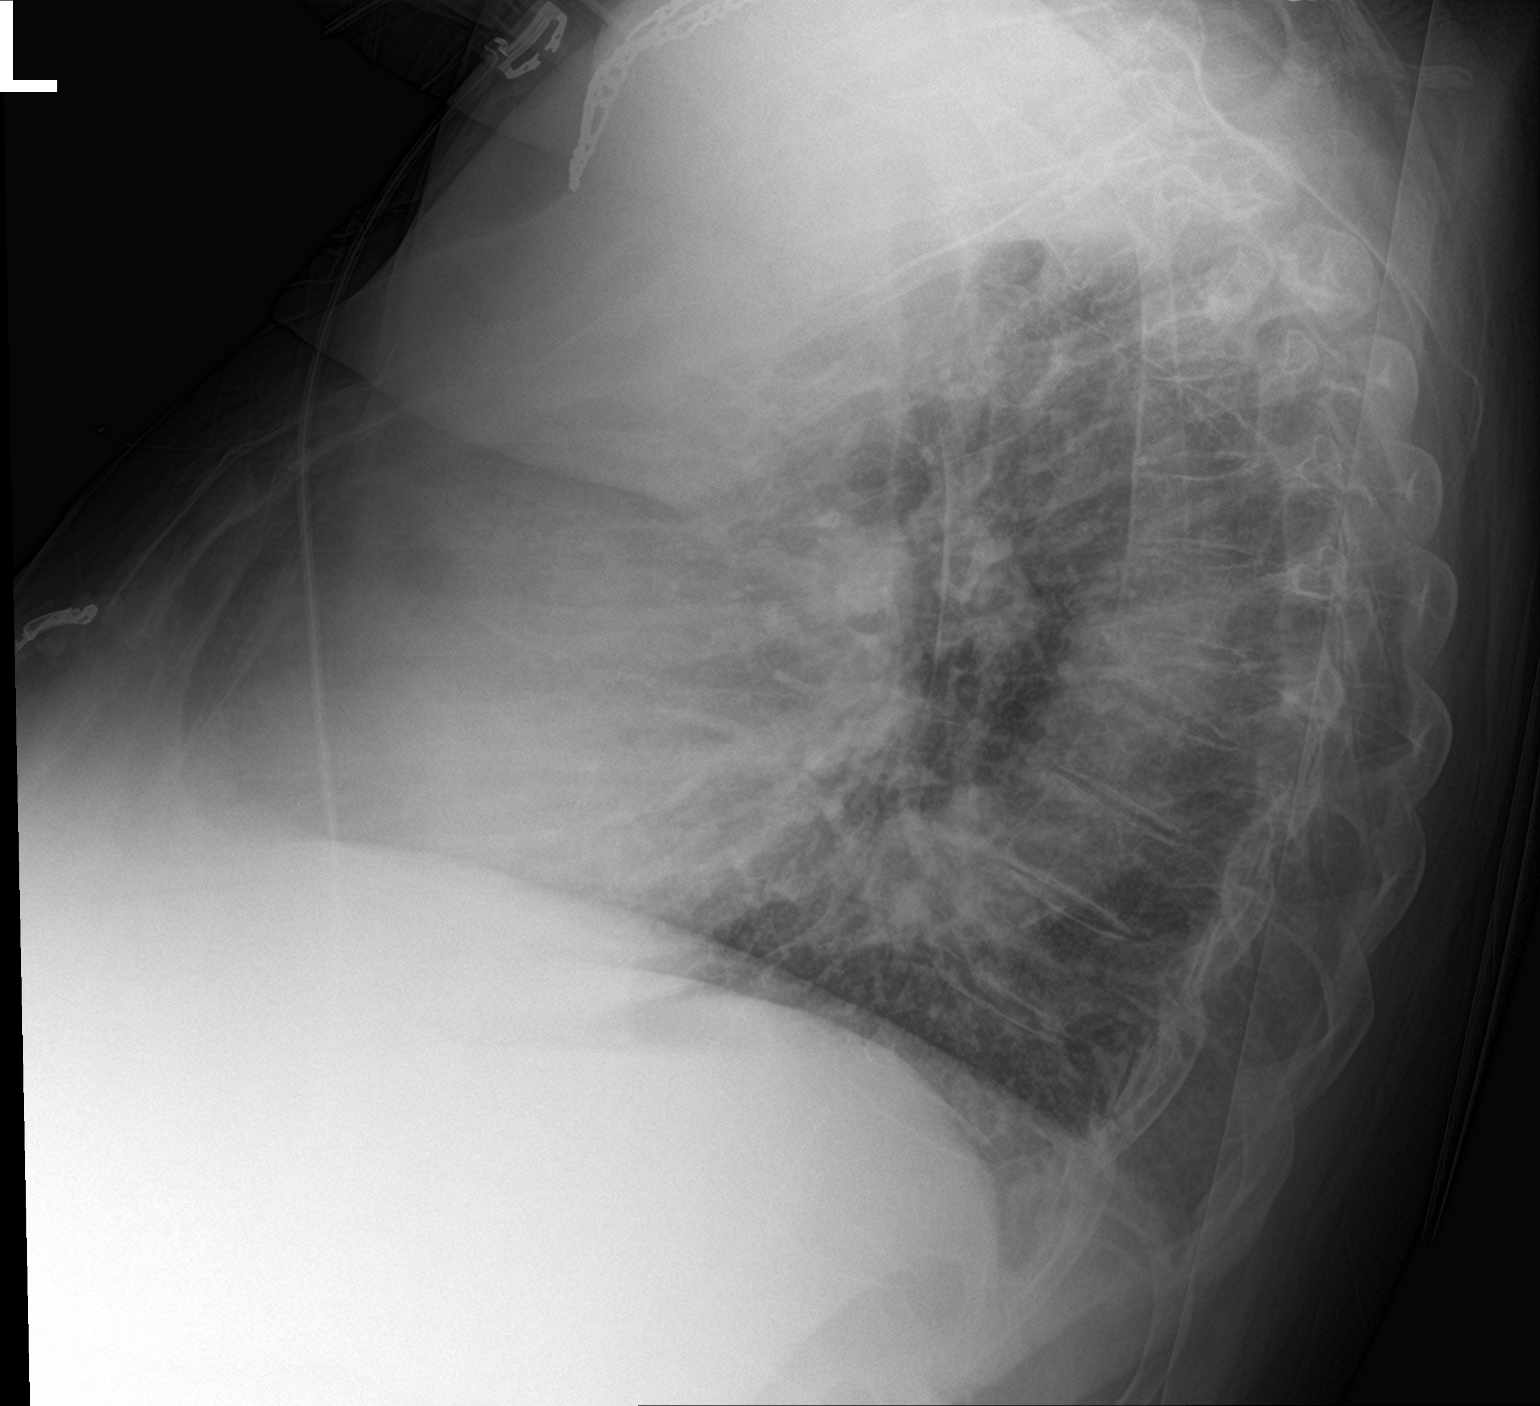

[chest ap]
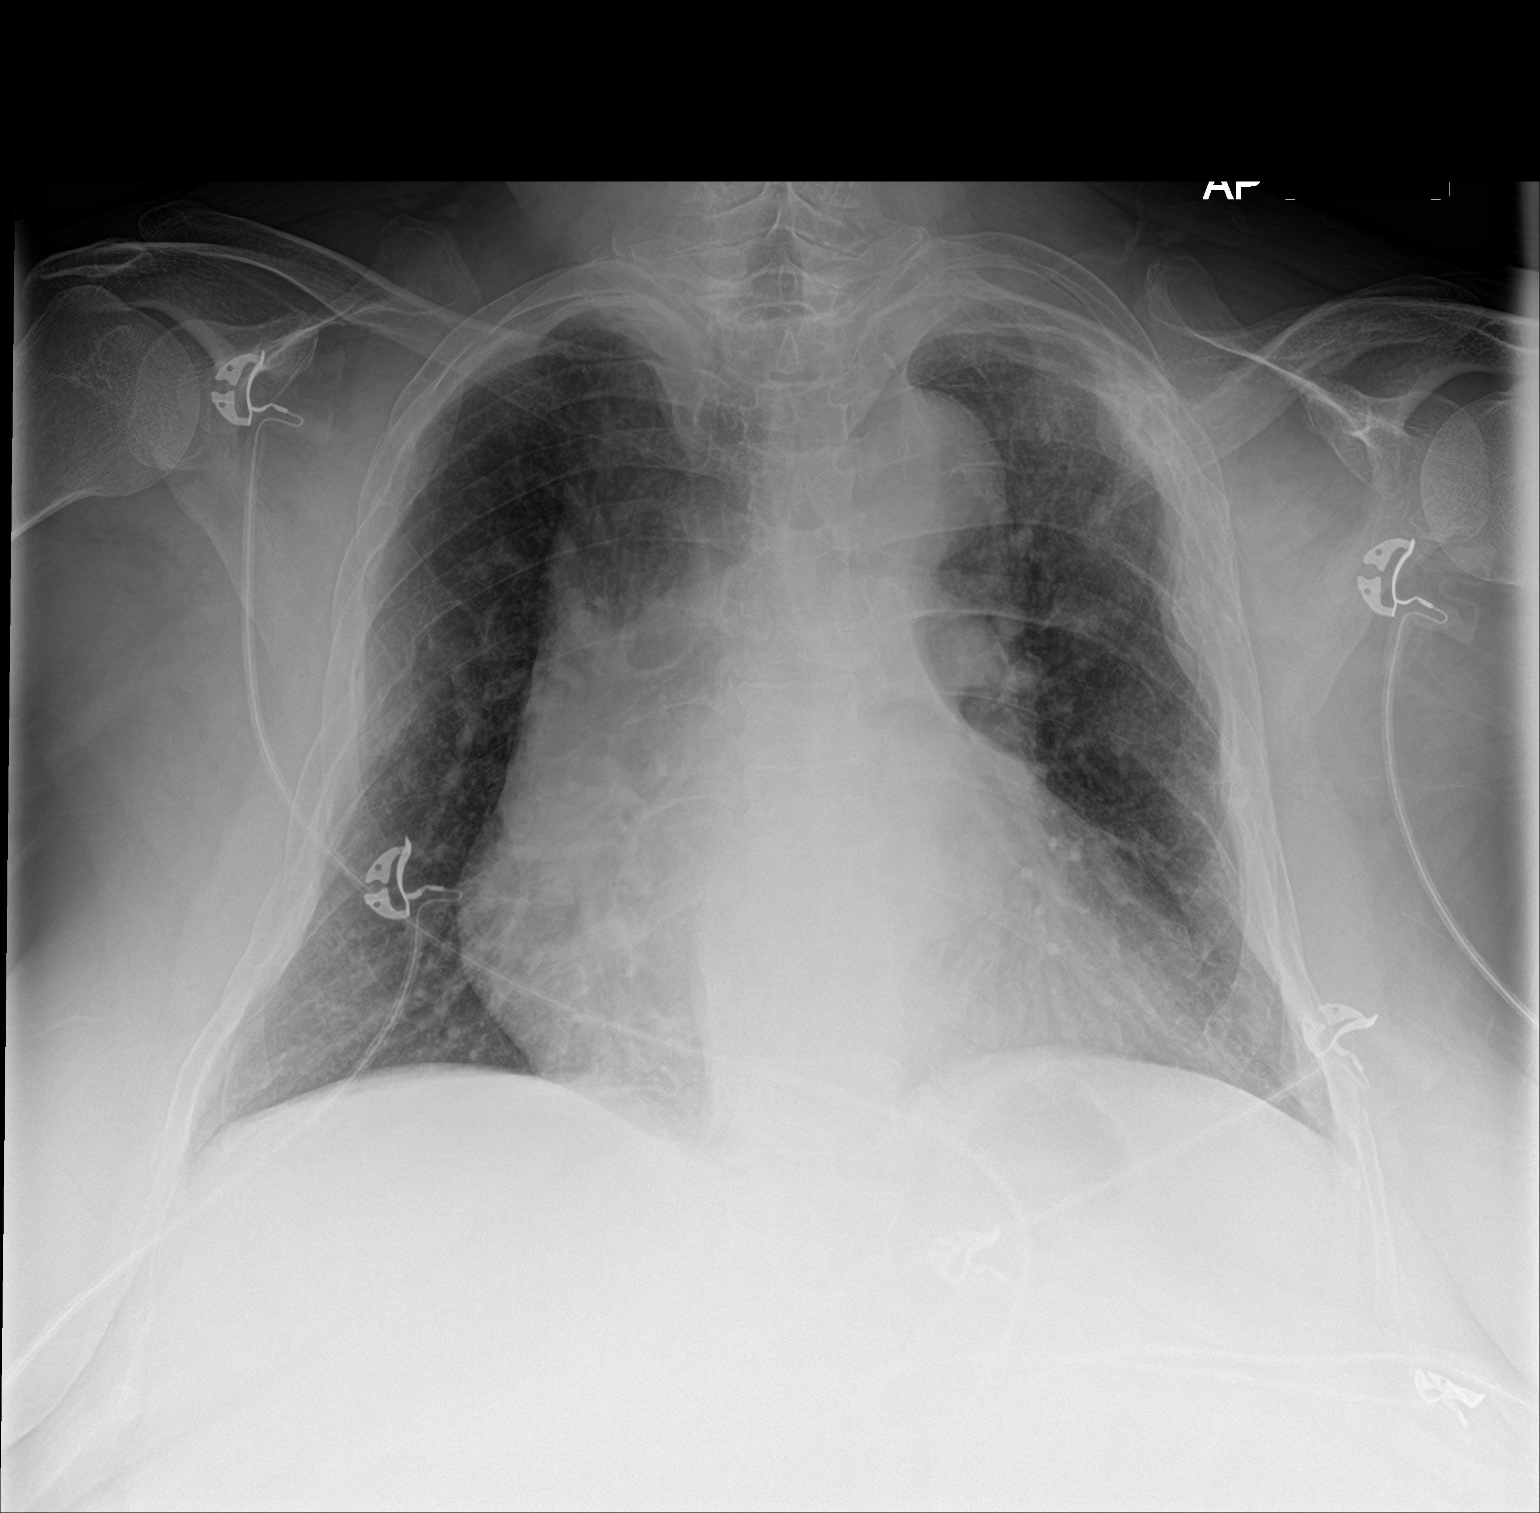

[2 of 2 positions shown; findings below may reference images not displayed]

FINDINGS: Cardiomegaly is unchanged. Tortuous thoracic aorta is unchanged.
Minimal vascular congestion without convincing pulmonary edema. No
pneumothorax, focal airspace disease, pleural effusion or
pneumothorax. Multiple bilateral chronic rib fractures. Compression
fracture in the midthoracic spine is unchanged. Remote sternal
fracture.
IMPRESSION: 1. Unchanged cardiomegaly and tortuous thoracic aorta.
2. Minimal vascular congestion without overt pulmonary edema.

## 2019-03-19 IMAGING — CT CT ABD-PELV W/ CM
2 of 5 series · 16 of 46 positions shown, 18 images · IV contrast (iopamidol)
Comparison: CT abdomen pelvis dated January 14, 2014.

CLINICAL DATA: Nausea and vomiting.

EXAM:
CT ABDOMEN AND PELVIS WITH CONTRAST
TECHNIQUE: Multidetector CT imaging of the abdomen and pelvis was performed
using the standard protocol following bolus administration of
intravenous contrast.
CONTRAST:  100mL WJE4AK-KXX IOPAMIDOL (WJE4AK-KXX) INJECTION 61%

[Series 2: routine abd/pel with · axial · 0.94mm/px · z∈[-482,-72]mm · 13 of 92 slices shown, 15 images]
[im 5/92  soft-tissue]
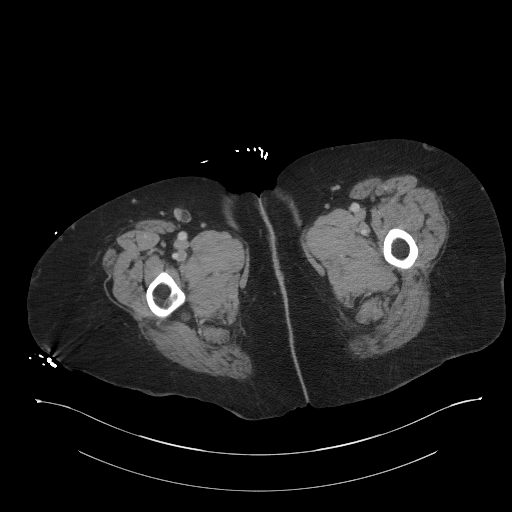
[im 5/92  bone]
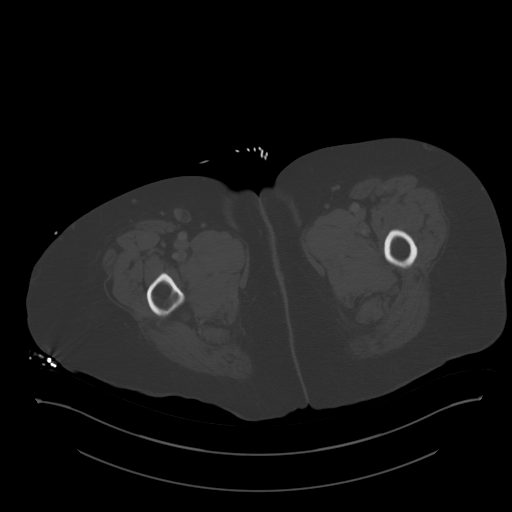
[im 15/92  soft-tissue]
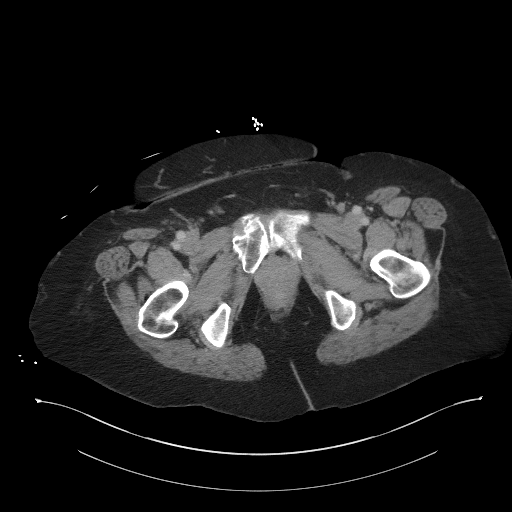
[im 20/92  soft-tissue]
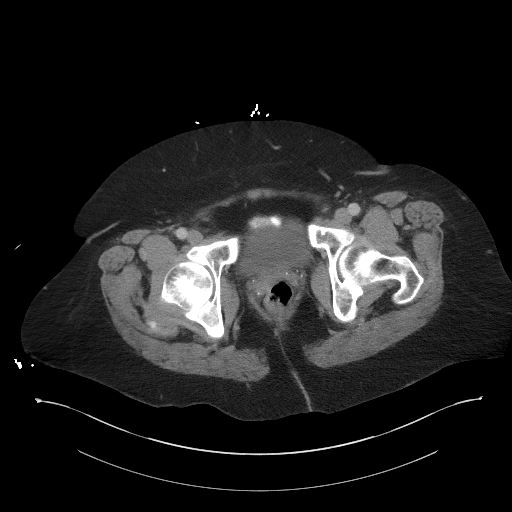
[im 24/92  soft-tissue]
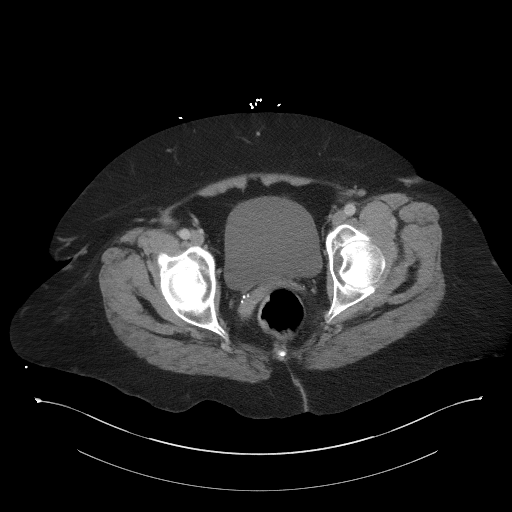
[im 34/92  soft-tissue]
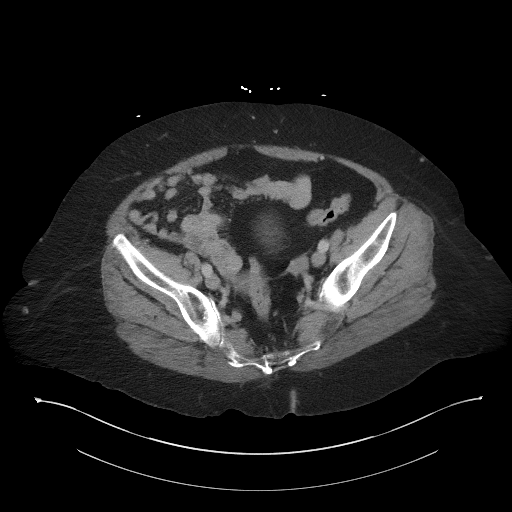
[im 39/92  soft-tissue]
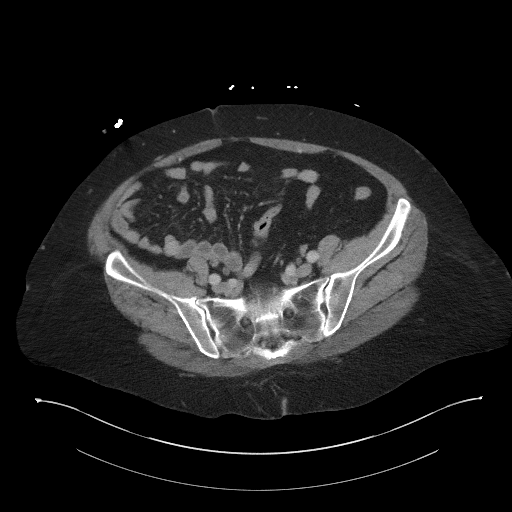
[im 48/92  soft-tissue]
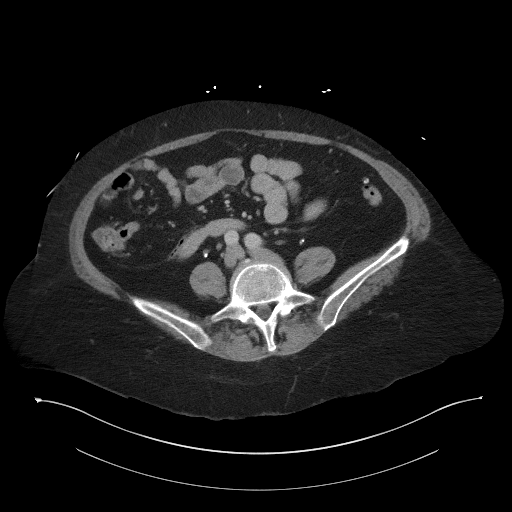
[im 53/92  soft-tissue]
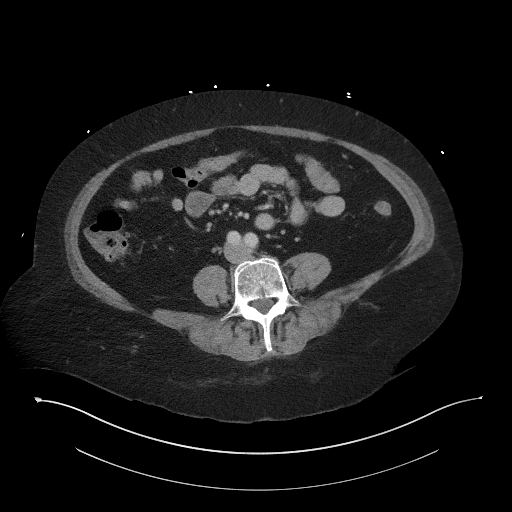
[im 58/92  soft-tissue]
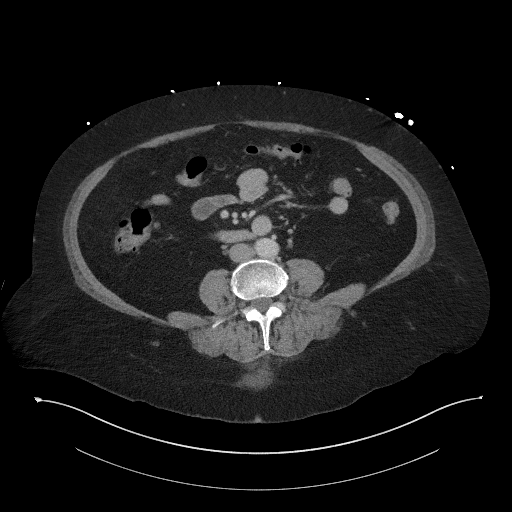
[im 58/92  bone]
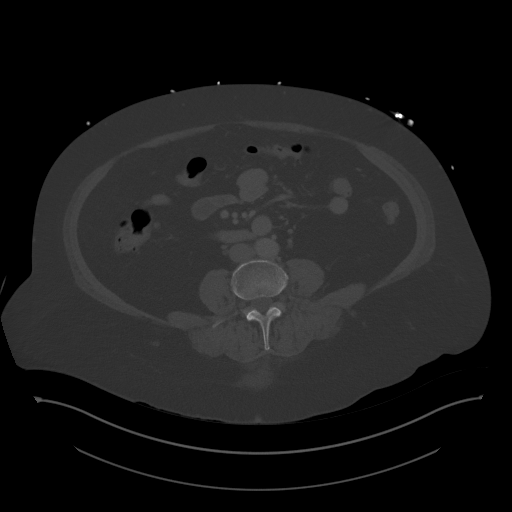
[im 68/92  soft-tissue]
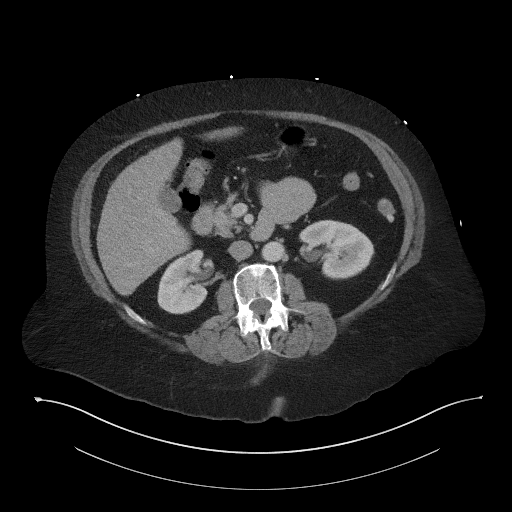
[im 72/92  soft-tissue]
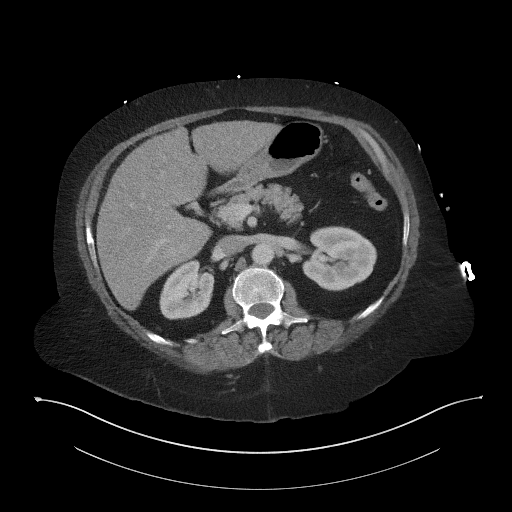
[im 77/92  soft-tissue]
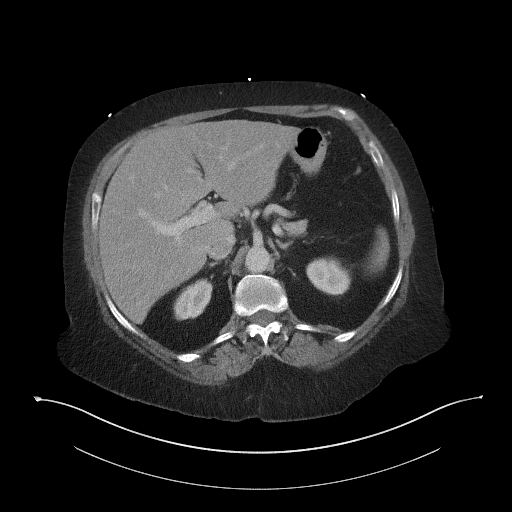
[im 87/92  soft-tissue]
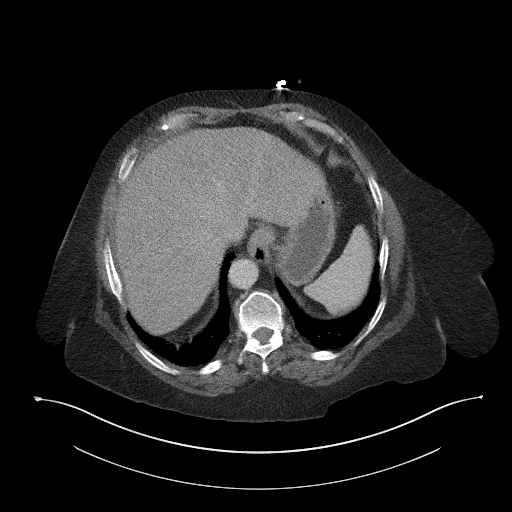

[Series 5: coronal st · coronal · 0.93mm/px · 3 of 101 slices shown]
[im 34/101  soft-tissue]
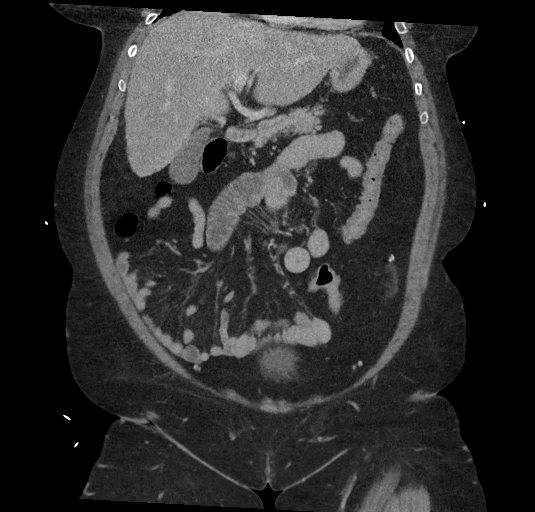
[im 45/101  soft-tissue]
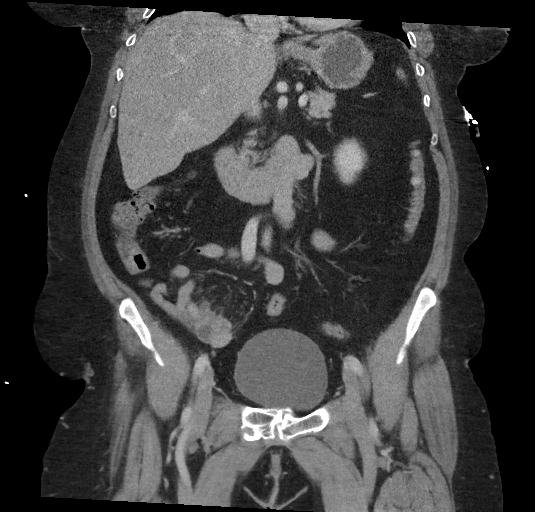
[im 56/101  soft-tissue]
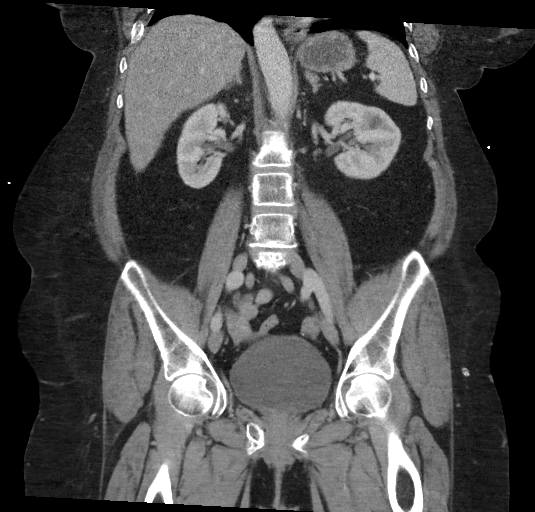

[16 of 46 positions shown; findings below may reference images not displayed]

FINDINGS: Lower chest: No acute abnormality.

Hepatobiliary: Diffuse hepatic steatosis. No focal liver
abnormality. The gallbladder is unremarkable. No biliary dilatation.

Pancreas: Unremarkable. No pancreatic ductal dilatation or
surrounding inflammatory changes.

Spleen: Punctate calcified granuloma in the spleen. Otherwise
unremarkable. Normal in size.

Adrenals/Urinary Tract: Unchanged 3.5 x 2.2 cm left adrenal nodule,
stable since January 2012, benign. The right adrenal gland is
unremarkable. Punctate nonobstructive bilateral renal calculi.
Slight interval increase in size of the 1.2 cm simple cyst in the
left kidney. Other subcentimeter low-density lesions in the left
kidney are too small to characterize. No ureteral calculi or
hydronephrosis. The bladder is unremarkable.

Stomach/Bowel: Small hiatal hernia. The stomach is otherwise within
normal limits. Normal appendix. No bowel wall thickening,
distention, or surrounding inflammatory changes. Mild colonic
diverticulosis.

Vascular/Lymphatic: Minimal aortic atherosclerosis. No abdominal or
pelvic lymphadenopathy.

Reproductive: Uterus and bilateral adnexa are unremarkable.

Other: Tiny fat containing umbilical hernia. No free fluid or
pneumoperitoneum.

Musculoskeletal: No acute or significant osseous findings. Old
bilateral lower rib fractures. Old bilateral inferior pubic rami
fractures. Old right parasymphyseal superior pubic ramus fracture.
IMPRESSION: 1.  No acute intra-abdominal process.
2. Diffuse hepatic steatosis.
3. Punctate bilateral nonobstructive nephrolithiasis.

## 2019-03-26 ENCOUNTER — Ambulatory Visit (INDEPENDENT_AMBULATORY_CARE_PROVIDER_SITE_OTHER): Payer: Medicaid Other | Admitting: Nurse Practitioner

## 2019-04-05 ENCOUNTER — Ambulatory Visit (INDEPENDENT_AMBULATORY_CARE_PROVIDER_SITE_OTHER): Payer: Medicaid Other | Admitting: Nurse Practitioner

## 2019-04-05 ENCOUNTER — Encounter (INDEPENDENT_AMBULATORY_CARE_PROVIDER_SITE_OTHER): Payer: Self-pay | Admitting: Nurse Practitioner

## 2019-04-05 ENCOUNTER — Other Ambulatory Visit: Payer: Self-pay

## 2019-04-05 VITALS — BP 193/124 | HR 69 | Resp 16 | Wt 245.0 lb

## 2019-04-05 DIAGNOSIS — I890 Lymphedema, not elsewhere classified: Secondary | ICD-10-CM | POA: Diagnosis not present

## 2019-04-05 DIAGNOSIS — I10 Essential (primary) hypertension: Secondary | ICD-10-CM | POA: Diagnosis not present

## 2019-04-05 DIAGNOSIS — E785 Hyperlipidemia, unspecified: Secondary | ICD-10-CM | POA: Diagnosis not present

## 2019-04-10 ENCOUNTER — Encounter (INDEPENDENT_AMBULATORY_CARE_PROVIDER_SITE_OTHER): Payer: Self-pay | Admitting: Nurse Practitioner

## 2019-04-10 NOTE — Progress Notes (Signed)
SUBJECTIVE:  Patient ID: [name], female    DOB: 1965-05-25, 54 y.o.   MRN: [id number] Chief Complaint  Patient presents with  . Follow-up    6weeks follow up    HPI  [name] is a 54 y.o. female The patient returns to the office for followup evaluation regarding leg swelling.  The swelling has persisted but with the lymph pump is much, much better controlled. The pain associated with swelling is essentially eliminated. There have not been any interval development of a ulcerations or wounds.  The patient denies problems with the pump, noting it is working well and the leggings are in good condition.  Since the previous visit the patient has been wearing graduated compression stockings and using the lymph pump on a routine basis and  has noted significant improvement in the lymphedema.   Patient stated the lymph pump has been a very positive factor in her care.    Past Medical History:  Diagnosis Date  . A-fib ([place]) 2010  . Adrenal adenoma, left   . Alcohol abuse   . Allergy   . CHF (congestive heart failure) ([place])   . COPD (chronic obstructive pulmonary disease) ([place])   . Fatty liver   . GERD (gastroesophageal reflux disease)   . Gout   . Hypertension   . Mild cognitive impairment   . Non-healing non-surgical wound    right leg  . Obesity   . Pulmonary embolus ([place])   . Sleep apnea    uses Cpap  . Stroke ([place])   . Thyroid disease     Past Surgical History:  Procedure Laterality Date  . bilateral wrist fractures  2010  . LEFT HEART CATH AND CORONARY ANGIOGRAPHY N/A 03/27/2018   Procedure: LEFT HEART CATH AND CORONARY ANGIOGRAPHY;  Surgeon: Isaias Cowman, MD;  Location: [facility];  Service: Cardiovascular;  Laterality: N/A;  . lt ankle fracture  2002    Social History   Socioeconomic History  . Marital status: Legally Separated    Spouse name: Not on file  . Number of children: Not on file  . Years of education: Not on file  .  Highest education level: Not on file  Occupational History  . Not on file  Tobacco Use  . Smoking status: Former Smoker    Types: Cigarettes    Quit date: 01/26/2016    Years since quitting: 3.2  . Smokeless tobacco: Never Used  Substance and Sexual Activity  . Alcohol use: Yes    Comment: Occassionally  . Drug use: No  . Sexual activity: Never  Other Topics Concern  . Not on file  Social History Narrative  . Not on file   Social Determinants of Health   Financial Resource Strain:   . Difficulty of Paying Living Expenses:   Food Insecurity:   . Worried About Charity fundraiser in the Last Year:   . Arboriculturist in the Last Year:   Transportation Needs:   . Film/video editor (Medical):   Marland Kitchen Lack of Transportation (Non-Medical):   Physical Activity:   . Days of Exercise per Week:   . Minutes of Exercise per Session:   Stress:   . Feeling of Stress :   Social Connections:   . Frequency of Communication with Friends and Family:   . Frequency of Social Gatherings with Friends and Family:   . Attends Religious Services:   . Active Member of Clubs or Organizations:   .  Attends Archivist Meetings:   Marland Kitchen Marital Status:   Intimate Partner Violence:   . Fear of Current or Ex-Partner:   . Emotionally Abused:   Marland Kitchen Physically Abused:   . Sexually Abused:     Family History  Problem Relation Age of Onset  . Prostate cancer Father   . Rectal cancer Sister   . Breast cancer Sister   . Cancer Maternal Grandmother   . Cancer Maternal Grandfather     Allergies  Allergen Reactions  . Morphine And Related Hives  . Penicillins Other (See Comments)    Painful urination Has patient had a PCN reaction causing immediate rash, facial/tongue/throat swelling, SOB or lightheadedness with hypotension: yes Has patient had a PCN reaction causing severe rash involving mucus membranes or skin necrosis: no Has patient had a PCN reaction that required hospitalization no Has  patient had a PCN reaction occurring within the last 10 years: no If all of the above answers are "NO", then may proceed with Cephalosporin use.      Review of Systems   Review of Systems: Negative Unless Checked Constitutional: [] Weight loss  [] Fever  [] Chills Cardiac: [] Chest pain   []  Atrial Fibrillation  [] Palpitations   [] Shortness of breath when laying flat   [] Shortness of breath with exertion. [] Shortness of breath at rest Vascular:  [] Pain in legs with walking   [] Pain in legs with standing [] Pain in legs when laying flat   [] Claudication    [] Pain in feet when laying flat    [] History of DVT   [] Phlebitis   [x] Swelling in legs   [] Varicose veins   [] Non-healing ulcers Pulmonary:   [] Uses home oxygen   [] Productive cough   [] Hemoptysis   [] Wheeze  [x] COPD   [] Asthma Neurologic:  [] Dizziness   [] Seizures  [] Blackouts [] History of stroke   [] History of TIA  [] Aphasia   [] Temporary Blindness   [] Weakness or numbness in arm   [] Weakness or numbness in leg Musculoskeletal:   [] Joint swelling   [] Joint pain   [] Low back pain  []  History of Knee Replacement [] Arthritis [] back Surgeries  []  Spinal Stenosis    Hematologic:  [] Easy bruising  [] Easy bleeding   [] Hypercoagulable state   [] Anemic Gastrointestinal:  [] Diarrhea   [] Vomiting  [] Gastroesophageal reflux/heartburn   [] Difficulty swallowing. [] Abdominal pain Genitourinary:  [] Chronic kidney disease   [] Difficult urination  [] Anuric   [] Blood in urine [] Frequent urination  [] Burning with urination   [] Hematuria Skin:  [] Rashes   [] Ulcers [] Wounds Psychological:  [] History of anxiety   []  History of major depression  []  Memory Difficulties      OBJECTIVE:   Physical Exam  BP (!) 193/124 (BP Location: Right Arm)   Pulse 69   Resp 16   Wt 245 lb (111.1 kg)   BMI 36.18 kg/m   Gen: WD/WN, NAD Head: [place]/AT, No temporalis wasting.  Ear/Nose/Throat: Hearing grossly intact, nares w/o erythema or drainage Eyes: PER, EOMI, sclera  nonicteric.  Neck: Supple, no masses.  No JVD.  Pulmonary:  Good air movement, no use of accessory muscles.  Cardiac: RRR Vascular: minimal edema  Vessel Right Left  Radial Palpable Palpable  Dorsalis Pedis Palpable Palpable  Posterior Tibial Palpable Palpable   Gastrointestinal: soft, non-distended. No guarding/no peritoneal signs.  Musculoskeletal: M/S 5/5 throughout.  No deformity or atrophy.  Neurologic: Pain and light touch intact in extremities.  Symmetrical.  Speech is fluent. Motor exam as listed above. Psychiatric: Judgment intact, Mood & affect appropriate for  pt's clinical situation. Dermatologic: No Venous rashes. No Ulcers Noted.  No changes consistent with cellulitis. Lymph : No Cervical lymphadenopathy, no lichenification or skin changes of chronic lymphedema.       ASSESSMENT AND PLAN:  1. Lymphedema  No surgery or intervention at this point in time.    I have reviewed my discussion with the patient regarding lymphedema and why it  causes symptoms.  Patient will continue wearing graduated compression stockings class 1 (20-30 mmHg) on a daily basis a prescription was given. The patient is reminded to put the stockings on first thing in the morning and removing them in the evening. The patient is instructed specifically not to sleep in the stockings.   In addition, behavioral modification throughout the day will be continued.  This will include frequent elevation (such as in a recliner), use of over the counter pain medications as needed and exercise such as walking.  I have reviewed systemic causes for chronic edema such as liver, kidney and cardiac etiologies and there does not appear to be any significant changes in these organ systems over the past year.  The patient is under the impression that these organ systems are all stable and unchanged.    The patient will continue aggressive use of the  lymph pump.  This will continue to improve the edema control and prevent  sequela such as ulcers and infections.   The patient will follow-up with me in 6 months    2. Hyperlipidemia, unspecified hyperlipidemia type Continue statin as ordered and reviewed, no changes at this time   3. Essential hypertension Continue antihypertensive medications as already ordered, these medications have been reviewed and there are no changes at this time.Patient also reports BP is high due to not taking meds today.  Patient showing no signs/symptoms of stroke. Advised to take as soon as she returns home.     Current Outpatient Medications on File Prior to Visit  Medication Sig Dispense Refill  . albuterol (VENTOLIN HFA) 108 (90 Base) MCG/ACT inhaler Inhale 2 puffs into the lungs every 6 (six) hours as needed for wheezing or shortness of breath. 18 g 1  . allopurinol (ZYLOPRIM) 300 MG tablet Take 1 tablet (300 mg total) by mouth daily. 30 tablet 6  . amLODipine (NORVASC) 10 MG tablet Take 1 tablet (10 mg total) by mouth daily. 90 tablet 1  . aspirin EC 81 MG tablet Take 81 mg by mouth daily.    Marland Kitchen atorvastatin (LIPITOR) 40 MG tablet Take 1 tablet (40 mg total) by mouth daily. 90 tablet 1  . cloNIDine (CATAPRES) 0.1 MG tablet Take 1 tablet (0.1 mg total) by mouth 2 (two) times daily. 60 tablet 6  . colchicine 0.6 MG tablet Take 1 tablet (0.6 mg total) by mouth 2 (two) times daily. As needed for gout flares 30 tablet 1  . gabapentin (NEURONTIN) 300 MG capsule 300 mg qAM, 600 mg qhs 90 capsule 5  . hydrALAZINE (APRESOLINE) 50 MG tablet Take 1 tablet (50 mg total) by mouth 3 (three) times daily. 90 tablet 0  . lisinopril (ZESTRIL) 20 MG tablet TAKE 1 TABLET BY MOUTH EVERY DAY 30 tablet 5  . LORazepam (ATIVAN) 0.5 MG tablet Take 1 tablet (0.5 mg total) by mouth every 8 (eight) hours as needed for anxiety. 30 tablet 0  . metoprolol (TOPROL XL) 200 MG 24 hr tablet Take 1 tablet (200 mg total) by mouth daily. 90 tablet 3  . ondansetron (ZOFRAN ODT) 4 MG disintegrating  tablet Take 1  tablet (4 mg total) by mouth every 8 (eight) hours as needed. 10 tablet 0  . pantoprazole (PROTONIX) 40 MG tablet TAKE 1 TABLET BY MOUTH EVERY DAY 90 tablet 0  . potassium chloride SA (KLOR-CON M20) 20 MEQ tablet Take 1 tablet (20 mEq total) by mouth 2 (two) times daily. 60 tablet 5  . sertraline (ZOLOFT) 100 MG tablet Take 1 tablet (100 mg total) by mouth daily. 90 tablet 1  . tiotropium (SPIRIVA HANDIHALER) 18 MCG inhalation capsule Place 1 capsule (18 mcg total) into inhaler and inhale daily. 30 capsule 0  . furosemide (LASIX) 40 MG tablet Take 1 tablet (40 mg total) by mouth daily. 90 tablet 3  . potassium chloride (KLOR-CON) 10 MEQ tablet Take 1 tablet (10 mEq total) by mouth daily for 5 days. 5 tablet 0   No current facility-administered medications on file prior to visit.    There are no Patient Instructions on file for this visit. No follow-ups on file.   Kris Hartmann, NP  This note was completed with Sales executive.  Any errors are purely unintentional.

## 2019-05-15 ENCOUNTER — Other Ambulatory Visit: Payer: Self-pay | Admitting: Family Medicine

## 2019-05-15 MED ORDER — COLCHICINE 0.6 MG PO TABS: 0.6000 mg | ORAL_TABLET | Freq: Two times a day (BID) | ORAL | 1 refills | Status: DC

## 2019-05-15 NOTE — Telephone Encounter (Signed)
Medication Refill - Medication: colchicine   Has the patient contacted their pharmacy? Yes.  Pt states that she is completely out of this medication. Please advise.  (Agent: If no, request that the patient contact the pharmacy for the refill.) (Agent: If yes, when and what did the pharmacy advise?)  Preferred Pharmacy (with phone number or street name):  [id number] - [place], [address]  [address] [place] [address]  Phone: [phone] Fax: [phone]  Not a 24 hour pharmacy; exact hours not known.    Agent: Please be advised that RX refills may take up to 3 business days. We ask that you follow-up with your pharmacy.

## 2019-05-25 ENCOUNTER — Other Ambulatory Visit: Payer: Self-pay

## 2019-05-25 ENCOUNTER — Encounter: Payer: Self-pay | Admitting: Family Medicine

## 2019-05-25 ENCOUNTER — Ambulatory Visit (INDEPENDENT_AMBULATORY_CARE_PROVIDER_SITE_OTHER): Payer: Medicaid Other | Admitting: Family Medicine

## 2019-05-25 VITALS — BP 173/109 | HR 98 | Temp 98.3°F | Wt 244.0 lb

## 2019-05-25 DIAGNOSIS — R200 Anesthesia of skin: Secondary | ICD-10-CM

## 2019-05-25 DIAGNOSIS — M5442 Lumbago with sciatica, left side: Secondary | ICD-10-CM | POA: Diagnosis not present

## 2019-05-25 DIAGNOSIS — M5441 Lumbago with sciatica, right side: Secondary | ICD-10-CM

## 2019-05-25 DIAGNOSIS — R32 Unspecified urinary incontinence: Secondary | ICD-10-CM | POA: Diagnosis not present

## 2019-05-25 DIAGNOSIS — R202 Paresthesia of skin: Secondary | ICD-10-CM

## 2019-05-25 MED ORDER — PREDNISONE 20 MG PO TABS: 40.0000 mg | ORAL_TABLET | Freq: Every day | ORAL | 0 refills | Status: DC

## 2019-05-25 MED ORDER — HYDROCODONE-ACETAMINOPHEN 5-325 MG PO TABS: 1.0000 | ORAL_TABLET | Freq: Three times a day (TID) | ORAL | 0 refills | Status: DC | PRN

## 2019-05-25 NOTE — Progress Notes (Signed)
BP (!) 173/109   Pulse 98   Temp 98.3 F (36.8 C) (Oral)   Wt 244 lb (110.7 kg)   SpO2 96%   BMI 36.03 kg/m    Subjective:    Patient ID: [name], female    DOB: April 11, 1965, 54 y.o.   MRN: [id number]  HPI: [name] is a 54 y.o. female  Chief Complaint  Patient presents with  . Back Pain    pt states she has fallen a month ago  . Numbness    arms, hands and legs. bilateral.  . Urinary Incontinence  . Medication Refill   Arm and leg numbness and tingling, b/l low back pain, and urinary incontinence for about a month now after trying to pick a neighbor up from the ground. The incontinence is worse for about 2 weeks now. Difficulty walking due to pain and numbness. Taking gabapentin, BC powder, tylenol all without any relief. Pain worse down the right leg. Denies fever, chills, leg swelling or discoloration.   Relevant past medical, surgical, family and social history reviewed and updated as indicated. Interim medical history since our last visit reviewed. Allergies and medications reviewed and updated.  Review of Systems  Per HPI unless specifically indicated above     Objective:    BP (!) 173/109   Pulse 98   Temp 98.3 F (36.8 C) (Oral)   Wt 244 lb (110.7 kg)   SpO2 96%   BMI 36.03 kg/m   Wt Readings from Last 3 Encounters:  05/25/19 244 lb (110.7 kg)  04/05/19 245 lb (111.1 kg)  02/21/19 240 lb (108.9 kg)    Physical Exam Vitals and nursing note reviewed.  Constitutional:      Appearance: Normal appearance. She is not ill-appearing.  HENT:     Head: Atraumatic.  Eyes:     Extraocular Movements: Extraocular movements intact.     Conjunctiva/sclera: Conjunctivae normal.  Cardiovascular:     Rate and Rhythm: Normal rate and regular rhythm.     Heart sounds: Normal heart sounds.  Pulmonary:     Effort: Pulmonary effort is normal.     Breath sounds: Normal breath sounds.  Abdominal:     General: Bowel sounds are normal.     Palpations:  Abdomen is soft.     Tenderness: There is no abdominal tenderness.  Musculoskeletal:     Cervical back: Normal range of motion and neck supple.     Comments: SLR positive b/l per patient's reported pain Strength full and adequate all 4 extremities Antalgic gait, walking with a cane  Skin:    General: Skin is warm and dry.  Neurological:     Mental Status: She is alert and oriented to person, place, and time.     Sensory: No sensory deficit.     Motor: No weakness.  Psychiatric:        Mood and Affect: Mood normal.        Thought Content: Thought content normal.        Judgment: Judgment normal.     Results for orders placed or performed during the hospital encounter of [id number]  Basic metabolic panel  Result Value Ref Range   Sodium 140 135 - 145 mmol/L   Potassium 3.1 (L) 3.5 - 5.1 mmol/L   Chloride 104 98 - 111 mmol/L   CO2 22 22 - 32 mmol/L   Glucose, Bld 114 (H) 70 - 99 mg/dL   BUN 12 6 - 20 mg/dL  Creatinine, Ser 0.70 0.44 - 1.00 mg/dL   Calcium 9.1 8.9 - 10.3 mg/dL   GFR calc non Af Amer >60 >60 mL/min   GFR calc Af Amer >60 >60 mL/min   Anion gap 14 5 - 15  CBC  Result Value Ref Range   WBC 8.6 4.0 - 10.5 K/uL   RBC 5.10 3.87 - 5.11 MIL/uL   Hemoglobin 14.0 12.0 - 15.0 g/dL   HCT 42.8 36.0 - 46.0 %   MCV 83.9 80.0 - 100.0 fL   MCH 27.5 26.0 - 34.0 pg   MCHC 32.7 30.0 - 36.0 g/dL   RDW 14.9 11.5 - 15.5 %   Platelets 301 150 - 400 K/uL   nRBC 0.0 0.0 - 0.2 %  Troponin I (High Sensitivity)  Result Value Ref Range   Troponin I (High Sensitivity) 7 <18 ng/L      Assessment & Plan:   Problem List Items Addressed This Visit    None    Visit Diagnoses    Low back pain due to bilateral sciatica    -  Primary   Tx wtih prednisone, norco prn and go to ER for further emergent workup given red flag sxs to r/o cauda equina. F/u as needed   Relevant Medications   predniSONE (DELTASONE) 20 MG tablet   HYDROcodone-acetaminophen (NORCO/VICODIN) 5-325 MG tablet    Urinary incontinence, unspecified type       New since onset of back pain, discussed significant concern and need for ER/emergent MRI. Pt will go tonight or tomorrow   Numbness and tingling       Concern for nerve compression given sxs. ER for emergent MRI, will place urgent Neurosurgery referral if MRI abnormal       Follow up plan: Return if symptoms worsen or fail to improve.

## 2019-05-28 ENCOUNTER — Telehealth: Payer: Self-pay

## 2019-05-28 NOTE — Telephone Encounter (Signed)
Prior Authorization initiated via [employer] for Colchicine Confirmation #: [id number]  PA Approved

## 2019-05-30 ENCOUNTER — Telehealth: Payer: Self-pay | Admitting: Family Medicine

## 2019-05-30 DIAGNOSIS — M5441 Lumbago with sciatica, right side: Secondary | ICD-10-CM

## 2019-05-30 NOTE — Telephone Encounter (Signed)
Pt called stating that the hydrocodone that she was prescribed is not helping with her back pain. Pt is requesting to have a referral to the pain management clinic. Please advise.

## 2019-05-31 NOTE — Telephone Encounter (Signed)
Referral generated

## 2019-05-31 NOTE — Telephone Encounter (Signed)
Pt.notified

## 2019-06-06 ENCOUNTER — Telehealth: Payer: Self-pay | Admitting: Family Medicine

## 2019-06-06 NOTE — Telephone Encounter (Signed)
Copied from [place] [id number]. Topic: General - Other >> Jun 06, 2019 11:23 AM Leward Quan A wrote: Reason for CRM: Patient called to inquire of [name] for some more pain medication for her back pain. She states that she can not see the pain Dr until July asking for a call back at Ph# [phone]

## 2019-06-06 NOTE — Telephone Encounter (Signed)
Routing to provider  

## 2019-06-07 MED ORDER — HYDROCODONE-ACETAMINOPHEN 5-325 MG PO TABS: 1.0000 | ORAL_TABLET | Freq: Three times a day (TID) | ORAL | 0 refills | Status: DC | PRN

## 2019-06-07 NOTE — Telephone Encounter (Signed)
Rx refilled and to be used sparingly. Will need appt if not able to last her until Pain Mgmt appt

## 2019-06-07 NOTE — Telephone Encounter (Signed)
Patient notified

## 2019-06-18 ENCOUNTER — Telehealth: Payer: Self-pay | Admitting: Family Medicine

## 2019-06-18 NOTE — Telephone Encounter (Signed)
Copied from [place] [id number]. Topic: General - Other >> Jun 18, 2019  9:10 AM Oneta Rack wrote: Reason for CRM: patient would like PCP to excuse her from jury duty on 07/02/2019 due to ongoing back pain. Patient would like to drop off form, pleas advise [phone]

## 2019-06-19 NOTE — Telephone Encounter (Signed)
Needs appt for forms, can be virtual

## 2019-06-19 NOTE — Telephone Encounter (Signed)
Called pt scheduled virtual for 5/26 forms in back

## 2019-06-20 ENCOUNTER — Encounter: Payer: Self-pay | Admitting: Family Medicine

## 2019-06-20 ENCOUNTER — Ambulatory Visit (INDEPENDENT_AMBULATORY_CARE_PROVIDER_SITE_OTHER): Payer: Medicaid Other | Admitting: Family Medicine

## 2019-06-20 VITALS — Wt 244.0 lb

## 2019-06-20 DIAGNOSIS — M5442 Lumbago with sciatica, left side: Secondary | ICD-10-CM | POA: Diagnosis not present

## 2019-06-20 DIAGNOSIS — M5441 Lumbago with sciatica, right side: Secondary | ICD-10-CM

## 2019-06-20 NOTE — Progress Notes (Signed)
Wt 244 lb (110.7 kg)   BMI 36.03 kg/m    Subjective:    Patient ID: [name], female    DOB: Jun 22, 1965, 54 y.o.   MRN: [id number]  HPI: [name] is a 54 y.o. female  Chief Complaint  Patient presents with  . Paperwork    for won't be able to do the [employer]    . This visit was completed via telephone due to the restrictions of the COVID-19 pandemic. All issues as above were discussed and addressed. Physical exam was done as above through visual confirmation on telephone. If it was felt that the patient should be evaluated in the office, they were directed there. The patient verbally consented to this visit. . Location of the patient: home . Location of the provider: work . Those involved with this call:  . Provider: Merrie Roof, PA-C . CMA: Lesle Chris, [place] . Front Desk/Registration: Jill Side  . Time spent on call: 10 minutes on the phone discussing health concerns. 5 minutes total spent in review of patient's record and preparation of their chart. I verified patient identity using two factors (patient name and date of birth). Patient consents verbally to being seen via telemedicine visit today.   Pt presenting today requesting excuse for jury duty due to her ongoing chronic back pain issues. States she is not able to sit for long periods of time, especially on the hard chairs in public places. [name] duty is set for June 7th.   Relevant past medical, surgical, family and social history reviewed and updated as indicated. Interim medical history since our last visit reviewed. Allergies and medications reviewed and updated.  Review of Systems  Per HPI unless specifically indicated above     Objective:    Wt 244 lb (110.7 kg)   BMI 36.03 kg/m   Wt Readings from Last 3 Encounters:  06/20/19 244 lb (110.7 kg)  05/25/19 244 lb (110.7 kg)  04/05/19 245 lb (111.1 kg)    Physical Exam  Unable to perform PE due to patient lack of access to video technology  for today's visit  Results for orders placed or performed during the hospital encounter of [id number]  Basic metabolic panel  Result Value Ref Range   Sodium 140 135 - 145 mmol/L   Potassium 3.1 (L) 3.5 - 5.1 mmol/L   Chloride 104 98 - 111 mmol/L   CO2 22 22 - 32 mmol/L   Glucose, Bld 114 (H) 70 - 99 mg/dL   BUN 12 6 - 20 mg/dL   Creatinine, Ser 0.70 0.44 - 1.00 mg/dL   Calcium 9.1 8.9 - 10.3 mg/dL   GFR calc non Af Amer >60 >60 mL/min   GFR calc Af Amer >60 >60 mL/min   Anion gap 14 5 - 15  CBC  Result Value Ref Range   WBC 8.6 4.0 - 10.5 K/uL   RBC 5.10 3.87 - 5.11 MIL/uL   Hemoglobin 14.0 12.0 - 15.0 g/dL   HCT 42.8 36.0 - 46.0 %   MCV 83.9 80.0 - 100.0 fL   MCH 27.5 26.0 - 34.0 pg   MCHC 32.7 30.0 - 36.0 g/dL   RDW 14.9 11.5 - 15.5 %   Platelets 301 150 - 400 K/uL   nRBC 0.0 0.0 - 0.2 %  Troponin I (High Sensitivity)  Result Value Ref Range   Troponin I (High Sensitivity) 7 <18 ng/L      Assessment & Plan:   Problem List  Items Addressed This Visit    None    Visit Diagnoses    Low back pain due to bilateral sciatica    -  Primary   Excuse letter created for jury duty due to ongoing back issues.        Follow up plan: Return if symptoms worsen or fail to improve.

## 2019-06-22 ENCOUNTER — Telehealth: Payer: Self-pay

## 2019-06-22 NOTE — Telephone Encounter (Signed)
PA submitted via cover my meds for colchicine, awaiting approvaql or denial.

## 2019-06-26 NOTE — Telephone Encounter (Signed)
PA needed to be submitted through [employer].  PA was approved.  PA # [id number]

## 2019-07-02 ENCOUNTER — Other Ambulatory Visit: Payer: Self-pay

## 2019-07-02 ENCOUNTER — Ambulatory Visit (INDEPENDENT_AMBULATORY_CARE_PROVIDER_SITE_OTHER): Payer: Medicaid Other | Admitting: Family Medicine

## 2019-07-02 ENCOUNTER — Encounter: Payer: Self-pay | Admitting: Family Medicine

## 2019-07-02 VITALS — BP 138/100 | HR 89 | Temp 98.8°F | Ht 69.0 in | Wt 246.8 lb

## 2019-07-02 DIAGNOSIS — E785 Hyperlipidemia, unspecified: Secondary | ICD-10-CM

## 2019-07-02 DIAGNOSIS — I10 Essential (primary) hypertension: Secondary | ICD-10-CM

## 2019-07-02 DIAGNOSIS — I4891 Unspecified atrial fibrillation: Secondary | ICD-10-CM | POA: Diagnosis not present

## 2019-07-02 DIAGNOSIS — I5032 Chronic diastolic (congestive) heart failure: Secondary | ICD-10-CM

## 2019-07-02 DIAGNOSIS — I890 Lymphedema, not elsewhere classified: Secondary | ICD-10-CM

## 2019-07-02 DIAGNOSIS — G4733 Obstructive sleep apnea (adult) (pediatric): Secondary | ICD-10-CM

## 2019-07-02 DIAGNOSIS — R7301 Impaired fasting glucose: Secondary | ICD-10-CM

## 2019-07-02 DIAGNOSIS — M1A9XX0 Chronic gout, unspecified, without tophus (tophi): Secondary | ICD-10-CM

## 2019-07-02 DIAGNOSIS — J449 Chronic obstructive pulmonary disease, unspecified: Secondary | ICD-10-CM | POA: Diagnosis not present

## 2019-07-02 MED ORDER — ATORVASTATIN CALCIUM 40 MG PO TABS: 40.0000 mg | ORAL_TABLET | Freq: Every day | ORAL | 1 refills | Status: DC

## 2019-07-02 MED ORDER — ALLOPURINOL 300 MG PO TABS: 300.0000 mg | ORAL_TABLET | Freq: Every day | ORAL | 6 refills | Status: DC

## 2019-07-02 MED ORDER — PANTOPRAZOLE SODIUM 40 MG PO TBEC: 40.0000 mg | DELAYED_RELEASE_TABLET | Freq: Every day | ORAL | 1 refills | Status: DC

## 2019-07-02 MED ORDER — CLONIDINE HCL 0.1 MG PO TABS: 0.1000 mg | ORAL_TABLET | Freq: Two times a day (BID) | ORAL | 6 refills | Status: DC

## 2019-07-02 MED ORDER — LISINOPRIL 40 MG PO TABS: 40.0000 mg | ORAL_TABLET | Freq: Every day | ORAL | 0 refills | Status: DC

## 2019-07-02 MED ORDER — ALBUTEROL SULFATE HFA 108 (90 BASE) MCG/ACT IN AERS: 2.0000 | INHALATION_SPRAY | Freq: Four times a day (QID) | RESPIRATORY_TRACT | 1 refills | Status: DC | PRN

## 2019-07-02 MED ORDER — SPIRIVA HANDIHALER 18 MCG IN CAPS: 18.0000 ug | ORAL_CAPSULE | Freq: Every day | RESPIRATORY_TRACT | 0 refills | Status: DC

## 2019-07-02 MED ORDER — HYDROCODONE-ACETAMINOPHEN 5-325 MG PO TABS: 1.0000 | ORAL_TABLET | Freq: Three times a day (TID) | ORAL | 0 refills | Status: DC | PRN

## 2019-07-02 MED ORDER — AMLODIPINE BESYLATE 10 MG PO TABS: 10.0000 mg | ORAL_TABLET | Freq: Every day | ORAL | 1 refills | Status: DC

## 2019-07-02 MED ORDER — POTASSIUM CHLORIDE CRYS ER 20 MEQ PO TBCR: 20.0000 meq | EXTENDED_RELEASE_TABLET | Freq: Two times a day (BID) | ORAL | 5 refills | Status: DC

## 2019-07-02 MED ORDER — HYDRALAZINE HCL 50 MG PO TABS: 50.0000 mg | ORAL_TABLET | Freq: Three times a day (TID) | ORAL | 0 refills | Status: DC

## 2019-07-02 MED ORDER — METOPROLOL SUCCINATE ER 200 MG PO TB24: 200.0000 mg | ORAL_TABLET | Freq: Every day | ORAL | 3 refills | Status: DC

## 2019-07-02 MED ORDER — SERTRALINE HCL 100 MG PO TABS: 100.0000 mg | ORAL_TABLET | Freq: Every day | ORAL | 1 refills | Status: DC

## 2019-07-02 MED ORDER — FUROSEMIDE 40 MG PO TABS: 40.0000 mg | ORAL_TABLET | Freq: Every day | ORAL | 3 refills | Status: DC

## 2019-07-02 NOTE — Progress Notes (Signed)
BP (!) 138/100   Pulse 89   Temp 98.8 F (37.1 C) (Oral)   Ht 5\' 9"  (1.753 m)   Wt 246 lb 12.8 oz (111.9 kg)   SpO2 96%   BMI 36.45 kg/m    Subjective:    Patient ID: [name], female    DOB: Dec 29, 1965, 54 y.o.   MRN: [id number]  HPI: [name] is a 54 y.o. female  Chief Complaint  Patient presents with  . Hypertension    Refill for Lisinopril  . Hyperlipidemia  . COPD  . IFG  . Depression    Refill for Sertraline  . Back Pain    Refill for Hydrocodone   Here today for 6 month f/u chronic conditions.   HTN, HF, atrial fibrillation - under a lot of stress at home right now. States her BPs have been high the past week or so while she's keeping her grandchildren but overall have been 140s-150s/90s. No leg swelling, SOB, CP noted. Overdue for HF Clinic f/u.   COPD - breathing well on spiriva and prn albuterol, no recent exacerbations, wheezing, CP, SOB.   OSA - uses CPAP sometimes, finds it uncomfortable so is not very consistent but it does help when used.   IFG - Not following strict diet or exercising. No polyuria, polydipsia, polyphagia, low blood sugar spells  HLD - On lipitor, tolerating well. Denies claudication, myalgias.   Lymphedema - using leg pumps, following with Vascular specialist. Currently stable  Gout - still having frequent exacerbations, no current exacerbation. Taking allopurinol and has colchicine for prn use.   Relevant past medical, surgical, family and social history reviewed and updated as indicated. Interim medical history since our last visit reviewed. Allergies and medications reviewed and updated.  Review of Systems  Per HPI unless specifically indicated above     Objective:    BP (!) 138/100   Pulse 89   Temp 98.8 F (37.1 C) (Oral)   Ht 5\' 9"  (1.753 m)   Wt 246 lb 12.8 oz (111.9 kg)   SpO2 96%   BMI 36.45 kg/m   Wt Readings from Last 3 Encounters:  07/02/19 246 lb 12.8 oz (111.9 kg)  06/20/19 244 lb (110.7  kg)  05/25/19 244 lb (110.7 kg)    Physical Exam Vitals and nursing note reviewed.  Constitutional:      Appearance: Normal appearance. She is not ill-appearing.  HENT:     Head: Atraumatic.  Eyes:     Extraocular Movements: Extraocular movements intact.     Conjunctiva/sclera: Conjunctivae normal.  Cardiovascular:     Rate and Rhythm: Normal rate.     Heart sounds: Normal heart sounds.  Pulmonary:     Effort: Pulmonary effort is normal.     Breath sounds: Normal breath sounds.  Musculoskeletal:        General: Normal range of motion.     Cervical back: Normal range of motion and neck supple.  Skin:    General: Skin is warm and dry.  Neurological:     Mental Status: She is alert and oriented to person, place, and time.  Psychiatric:        Mood and Affect: Mood normal.        Thought Content: Thought content normal.        Judgment: Judgment normal.     Results for orders placed or performed in visit on 07/02/19  Comprehensive metabolic panel  Result Value Ref Range   Glucose 111 (  H) 65 - 99 mg/dL   BUN 7 6 - 24 mg/dL   Creatinine, Ser 0.73 0.57 - 1.00 mg/dL   GFR calc non Af Amer 94 >59 mL/min/1.73   GFR calc Af Amer 109 >59 mL/min/1.73   BUN/Creatinine Ratio 10 9 - 23   Sodium 143 134 - 144 mmol/L   Potassium 3.4 (L) 3.5 - 5.2 mmol/L   Chloride 104 96 - 106 mmol/L   CO2 23 20 - 29 mmol/L   Calcium 9.4 8.7 - 10.2 mg/dL   Total Protein 6.8 6.0 - 8.5 g/dL   Albumin 4.0 3.8 - 4.9 g/dL   Globulin, Total 2.8 1.5 - 4.5 g/dL   Albumin/Globulin Ratio 1.4 1.2 - 2.2   Bilirubin Total 0.5 0.0 - 1.2 mg/dL   Alkaline Phosphatase 100 48 - 121 IU/L   AST 15 0 - 40 IU/L   ALT 11 0 - 32 IU/L  Lipid Panel w/o Chol/HDL Ratio  Result Value Ref Range   Cholesterol, Total 195 100 - 199 mg/dL   Triglycerides 108 0 - 149 mg/dL   HDL 48 >39 mg/dL   VLDL Cholesterol Cal 19 5 - 40 mg/dL   LDL Chol Calc (NIH) 128 (H) 0 - 99 mg/dL  Uric acid  Result Value Ref Range   Uric Acid 8.2  (H) 3.0 - 7.2 mg/dL  HgB A1c  Result Value Ref Range   Hgb A1c MFr Bld 5.8 (H) 4.8 - 5.6 %   Est. average glucose Bld gHb Est-mCnc 120 mg/dL      Assessment & Plan:   Problem List Items Addressed This Visit      Cardiovascular and Mediastinum   Essential hypertension - Primary (Chronic)    Mildly elevated, but under significant stress. Will continue to monitor closely, increase hydralazine to TID. Recheck 2 weeks      Relevant Medications   lisinopril (ZESTRIL) 40 MG tablet   metoprolol (TOPROL XL) 200 MG 24 hr tablet   hydrALAZINE (APRESOLINE) 50 MG tablet   furosemide (LASIX) 40 MG tablet   cloNIDine (CATAPRES) 0.1 MG tablet   amLODipine (NORVASC) 10 MG tablet   Other Relevant Orders   Comprehensive metabolic panel (Completed)   Chronic diastolic heart failure (HCC) (Chronic)    Stable and well controlled overall, follow up ASAP with HF clinic as she's well overdue. Continue current regimen and close monitoring      Relevant Medications   lisinopril (ZESTRIL) 40 MG tablet   metoprolol (TOPROL XL) 200 MG 24 hr tablet   hydrALAZINE (APRESOLINE) 50 MG tablet   furosemide (LASIX) 40 MG tablet   cloNIDine (CATAPRES) 0.1 MG tablet   amLODipine (NORVASC) 10 MG tablet   A-fib (HCC)    Stable, good rate control. Continue current regimen      Relevant Medications   lisinopril (ZESTRIL) 40 MG tablet   metoprolol (TOPROL XL) 200 MG 24 hr tablet   hydrALAZINE (APRESOLINE) 50 MG tablet   furosemide (LASIX) 40 MG tablet   cloNIDine (CATAPRES) 0.1 MG tablet   amLODipine (NORVASC) 10 MG tablet     Respiratory   COPD (chronic obstructive pulmonary disease) (HCC)    Stable and well controlled, continue current regimen      Relevant Medications   tiotropium (SPIRIVA HANDIHALER) 18 MCG inhalation capsule   albuterol (VENTOLIN HFA) 108 (90 Base) MCG/ACT inhaler   Sleep apnea    Work on consistent use of CPAP, weight loss        Endocrine  IFG (impaired fasting glucose)     Recheck A1C, work on diet and exercise changes      Relevant Orders   HgB A1c (Completed)     Other   Hyperlipidemia    Recheck lipids, adjust as needed. Continue current regimen and work on lifestyle modifications      Relevant Medications   lisinopril (ZESTRIL) 40 MG tablet   metoprolol (TOPROL XL) 200 MG 24 hr tablet   hydrALAZINE (APRESOLINE) 50 MG tablet   furosemide (LASIX) 40 MG tablet   cloNIDine (CATAPRES) 0.1 MG tablet   amLODipine (NORVASC) 10 MG tablet   Other Relevant Orders   Lipid Panel w/o Chol/HDL Ratio (Completed)   Gout    Not well controlled, recheck uric acid and increase medications if needed. Discussed tart cherry supplements and lifestyle modifications additionally      Relevant Orders   Uric acid (Completed)   Lymphedema    Stable and well controlled, continue current regimen          Follow up plan: Return in about 2 weeks (around 07/16/2019) for HTN.

## 2019-07-03 LAB — COMPREHENSIVE METABOLIC PANEL
ALT: 11 IU/L (ref 0–32)
AST: 15 IU/L (ref 0–40)
Albumin/Globulin Ratio: 1.4 (ref 1.2–2.2)
Albumin: 4 g/dL (ref 3.8–4.9)
Alkaline Phosphatase: 100 IU/L (ref 48–121)
BUN/Creatinine Ratio: 10 (ref 9–23)
BUN: 7 mg/dL (ref 6–24)
Bilirubin Total: 0.5 mg/dL (ref 0.0–1.2)
CO2: 23 mmol/L (ref 20–29)
Calcium: 9.4 mg/dL (ref 8.7–10.2)
Chloride: 104 mmol/L (ref 96–106)
Creatinine, Ser: 0.73 mg/dL (ref 0.57–1.00)
GFR calc Af Amer: 109 mL/min/{1.73_m2} (ref >59)
GFR calc non Af Amer: 94 mL/min/{1.73_m2} (ref >59)
Globulin, Total: 2.8 g/dL (ref 1.5–4.5)
Glucose: 111 mg/dL — ABNORMAL HIGH (ref 65–99)
Potassium: 3.4 mmol/L — ABNORMAL LOW (ref 3.5–5.2)
Sodium: 143 mmol/L (ref 134–144)
Total Protein: 6.8 g/dL (ref 6.0–8.5)

## 2019-07-03 LAB — HEMOGLOBIN A1C
Est. average glucose Bld gHb Est-mCnc: 120 mg/dL
Hgb A1c MFr Bld: 5.8 % — ABNORMAL HIGH (ref 4.8–5.6)

## 2019-07-03 LAB — LIPID PANEL W/O CHOL/HDL RATIO
Cholesterol, Total: 195 mg/dL (ref 100–199)
HDL: 48 mg/dL (ref >39)
LDL Chol Calc (NIH): 128 mg/dL — ABNORMAL HIGH (ref 0–99)
Triglycerides: 108 mg/dL (ref 0–149)
VLDL Cholesterol Cal: 19 mg/dL (ref 5–40)

## 2019-07-03 LAB — URIC ACID: Uric Acid: 8.2 mg/dL — ABNORMAL HIGH (ref 3.0–7.2)

## 2019-07-05 ENCOUNTER — Other Ambulatory Visit: Payer: Self-pay | Admitting: Family Medicine

## 2019-07-05 DIAGNOSIS — E876 Hypokalemia: Secondary | ICD-10-CM

## 2019-07-05 MED ORDER — ATORVASTATIN CALCIUM 80 MG PO TABS: 80.0000 mg | ORAL_TABLET | Freq: Every day | ORAL | 1 refills | Status: DC

## 2019-07-05 MED ORDER — POTASSIUM CHLORIDE CRYS ER 20 MEQ PO TBCR: 20.0000 meq | EXTENDED_RELEASE_TABLET | Freq: Three times a day (TID) | ORAL | 5 refills | Status: DC

## 2019-07-09 NOTE — Assessment & Plan Note (Signed)
Work on consistent use of CPAP, weight loss

## 2019-07-09 NOTE — Assessment & Plan Note (Signed)
Stable and well controlled overall, follow up ASAP with HF clinic as she's well overdue. Continue current regimen and close monitoring

## 2019-07-09 NOTE — Assessment & Plan Note (Addendum)
Mildly elevated, but under significant stress. Will continue to monitor closely, increase hydralazine to TID. Recheck 2 weeks

## 2019-07-09 NOTE — Assessment & Plan Note (Signed)
Stable, good rate control. Continue current regimen

## 2019-07-09 NOTE — Assessment & Plan Note (Signed)
Not well controlled, recheck uric acid and increase medications if needed. Discussed tart cherry supplements and lifestyle modifications additionally

## 2019-07-09 NOTE — Assessment & Plan Note (Signed)
Stable and well controlled, continue current regimen 

## 2019-07-09 NOTE — Assessment & Plan Note (Signed)
Recheck A1C, work on diet and exercise changes

## 2019-07-09 NOTE — Assessment & Plan Note (Signed)
Recheck lipids, adjust as needed. Continue current regimen and work on lifestyle modifications

## 2019-07-19 ENCOUNTER — Encounter: Payer: Medicaid Other | Admitting: Student in an Organized Health Care Education/Training Program

## 2019-07-23 ENCOUNTER — Ambulatory Visit: Payer: Medicaid Other | Admitting: Nurse Practitioner

## 2019-07-23 ENCOUNTER — Other Ambulatory Visit: Payer: Self-pay

## 2019-07-23 ENCOUNTER — Encounter: Payer: Self-pay | Admitting: Nurse Practitioner

## 2019-07-23 DIAGNOSIS — H6122 Impacted cerumen, left ear: Secondary | ICD-10-CM | POA: Insufficient documentation

## 2019-07-23 NOTE — Patient Instructions (Signed)
May use Debrox at home as needed.  Earwax Buildup, Adult The ears produce a substance called earwax that helps keep bacteria out of the ear and protects the skin in the ear canal. Occasionally, earwax can build up in the ear and cause discomfort or hearing loss. What increases the risk? This condition is more likely to develop in people who:  Are female.  Are elderly.  Naturally produce more earwax.  Clean their ears often with cotton swabs.  Use earplugs often.  Use in-ear headphones often.  Wear hearing aids.  Have narrow ear canals.  Have earwax that is overly thick or sticky.  Have eczema.  Are dehydrated.  Have excess hair in the ear canal. What are the signs or symptoms? Symptoms of this condition include:  Reduced or muffled hearing.  A feeling of fullness in the ear or feeling that the ear is plugged.  Fluid coming from the ear.  Ear pain.  Ear itch.  Ringing in the ear.  Coughing.  An obvious piece of earwax that can be seen inside the ear canal. How is this diagnosed? This condition may be diagnosed based on:  Your symptoms.  Your medical history.  An ear exam. During the exam, your health care provider will look into your ear with an instrument called an otoscope. You may have tests, including a hearing test. How is this treated? This condition may be treated by:  Using ear drops to soften the earwax.  Having the earwax removed by a health care provider. The health care provider may: ? Flush the ear with water. ? Use an instrument that has a loop on the end (curette). ? Use a suction device.  Surgery to remove the wax buildup. This may be done in severe cases. Follow these instructions at home:   Take over-the-counter and prescription medicines only as told by your health care provider.  Do not put any objects, including cotton swabs, into your ear. You can clean the opening of your ear canal with a washcloth or facial  tissue.  Follow instructions from your health care provider about cleaning your ears. Do not over-clean your ears.  Drink enough fluid to keep your urine clear or pale yellow. This will help to thin the earwax.  Keep all follow-up visits as told by your health care provider. If earwax builds up in your ears often or if you use hearing aids, consider seeing your health care provider for routine, preventive ear cleanings. Ask your health care provider how often you should schedule your cleanings.  If you have hearing aids, clean them according to instructions from the manufacturer and your health care provider. Contact a health care provider if:  You have ear pain.  You develop a fever.  You have blood, pus, or other fluid coming from your ear.  You have hearing loss.  You have ringing in your ears that does not go away.  Your symptoms do not improve with treatment.  You feel like the room is spinning (vertigo). Summary  Earwax can build up in the ear and cause discomfort or hearing loss.  The most common symptoms of this condition include reduced or muffled hearing and a feeling of fullness in the ear or feeling that the ear is plugged.  This condition may be diagnosed based on your symptoms, your medical history, and an ear exam.  This condition may be treated by using ear drops to soften the earwax or by having the earwax removed by a  health care provider.  Do not put any objects, including cotton swabs, into your ear. You can clean the opening of your ear canal with a washcloth or facial tissue. This information is not intended to replace advice given to you by your health care provider. Make sure you discuss any questions you have with your health care provider. Document Revised: 12/24/2016 Document Reviewed: 03/24/2016 Elsevier Patient Education  2020 [employer].

## 2019-07-23 NOTE — Assessment & Plan Note (Signed)
Acute -- irrigation and manual removal cerumen left ear performed and tolerated well with report improvement of symptoms post procedure.  Instructed patient to avoid Q-tips at home.  May use OTC Debrox as needed for cerumen removal.  Return to office for any return of symptoms.

## 2019-07-23 NOTE — Progress Notes (Signed)
BP (!) 130/96   Pulse 82   Temp 98.3 F (36.8 C)   Ht 5' 5.75" (1.67 m)   Wt 238 lb 8 oz (108.2 kg)   SpO2 97%   BMI 38.79 kg/m    Subjective:    Patient ID: [name], female    DOB: 1965-09-08, 54 y.o.   MRN: [id number]  HPI: [name] is a 54 y.o. female  Chief Complaint  Patient presents with  . ears clogged   EARS PRESSURE Reports ears have been bothering her for two weeks, presenting with headaches and mild jaw pain due to pressure.  No pain to ears, only pressure and feeling like she can not hear well out of left ear. Duration: weeks Involved ear(s): bilateral Severity: no pain Quality: pressure only Fever: no Otorrhea: no Upper respiratory infection symptoms: no Pruritus: yes Hearing loss: no Water immersion no Using Q-tips: yes Recurrent otitis media: no Status: stable Treatments attempted: none  Relevant past medical, surgical, family and social history reviewed and updated as indicated. Interim medical history since our last visit reviewed. Allergies and medications reviewed and updated.  Review of Systems  Constitutional: Negative for activity change, appetite change, diaphoresis, fatigue and fever.  HENT: Negative for congestion, ear discharge, ear pain, sinus pressure, sinus pain and tinnitus.   Respiratory: Negative for cough, chest tightness, shortness of breath and wheezing.   Cardiovascular: Negative for chest pain, palpitations and leg swelling.  Gastrointestinal: Negative.   Endocrine: Negative.   Neurological: Positive for headaches. Negative for dizziness, syncope, weakness, light-headedness and numbness.  Psychiatric/Behavioral: Negative.     Per HPI unless specifically indicated above     Objective:    BP (!) 130/96   Pulse 82   Temp 98.3 F (36.8 C)   Ht 5' 5.75" (1.67 m)   Wt 238 lb 8 oz (108.2 kg)   SpO2 97%   BMI 38.79 kg/m   Wt Readings from Last 3 Encounters:  07/23/19 238 lb 8 oz (108.2 kg)  07/02/19 246  lb 12.8 oz (111.9 kg)  06/20/19 244 lb (110.7 kg)    Physical Exam Vitals and nursing note reviewed.  Constitutional:      General: She is awake. She is not in acute distress.    Appearance: She is well-developed and well-groomed. She is obese. She is not ill-appearing.  HENT:     Head: Normocephalic.     Jaw: No tenderness, swelling or pain on movement.     Right Ear: Hearing, tympanic membrane, ear canal and external ear normal.     Left Ear: Hearing normal. There is impacted cerumen.     Ears:     Comments: Irrigation performed with luke warm water and manual removal of cerumen to left ear.  Two rounds performed and tolerated well by patient with moderate cerumen removed from left ear.  Able to view TM after procedure and intact with no erythema.  Patient reported able to hear better and less pressure after procedure.    Nose: Nose normal.     Right Sinus: No maxillary sinus tenderness or frontal sinus tenderness.     Left Sinus: No maxillary sinus tenderness or frontal sinus tenderness.     Mouth/Throat:     Mouth: Mucous membranes are moist.     Pharynx: Oropharynx is clear. Uvula midline.  Eyes:     General: Lids are normal.        Right eye: No discharge.  Left eye: No discharge.     Conjunctiva/sclera: Conjunctivae normal.     Pupils: Pupils are equal, round, and reactive to light.  Neck:     Vascular: No carotid bruit.  Cardiovascular:     Rate and Rhythm: Normal rate and regular rhythm.     Heart sounds: Normal heart sounds. No murmur heard.  No gallop.   Pulmonary:     Effort: Pulmonary effort is normal. No accessory muscle usage or respiratory distress.     Breath sounds: Normal breath sounds.  Abdominal:     General: Bowel sounds are normal.     Palpations: Abdomen is soft.  Musculoskeletal:     Cervical back: Normal range of motion and neck supple.     Right lower leg: No edema.     Left lower leg: No edema.  Skin:    General: Skin is warm and dry.    Neurological:     Mental Status: She is alert and oriented to person, place, and time.  Psychiatric:        Attention and Perception: Attention normal.        Mood and Affect: Mood normal.        Speech: Speech normal.        Behavior: Behavior normal. Behavior is cooperative.        Thought Content: Thought content normal.     Results for orders placed or performed in visit on 07/02/19  Comprehensive metabolic panel  Result Value Ref Range   Glucose 111 (H) 65 - 99 mg/dL   BUN 7 6 - 24 mg/dL   Creatinine, Ser 0.73 0.57 - 1.00 mg/dL   GFR calc non Af Amer 94 >59 mL/min/1.73   GFR calc Af Amer 109 >59 mL/min/1.73   BUN/Creatinine Ratio 10 9 - 23   Sodium 143 134 - 144 mmol/L   Potassium 3.4 (L) 3.5 - 5.2 mmol/L   Chloride 104 96 - 106 mmol/L   CO2 23 20 - 29 mmol/L   Calcium 9.4 8.7 - 10.2 mg/dL   Total Protein 6.8 6.0 - 8.5 g/dL   Albumin 4.0 3.8 - 4.9 g/dL   Globulin, Total 2.8 1.5 - 4.5 g/dL   Albumin/Globulin Ratio 1.4 1.2 - 2.2   Bilirubin Total 0.5 0.0 - 1.2 mg/dL   Alkaline Phosphatase 100 48 - 121 IU/L   AST 15 0 - 40 IU/L   ALT 11 0 - 32 IU/L  Lipid Panel w/o Chol/HDL Ratio  Result Value Ref Range   Cholesterol, Total 195 100 - 199 mg/dL   Triglycerides 108 0 - 149 mg/dL   HDL 48 >39 mg/dL   VLDL Cholesterol Cal 19 5 - 40 mg/dL   LDL Chol Calc (NIH) 128 (H) 0 - 99 mg/dL  Uric acid  Result Value Ref Range   Uric Acid 8.2 (H) 3.0 - 7.2 mg/dL  HgB A1c  Result Value Ref Range   Hgb A1c MFr Bld 5.8 (H) 4.8 - 5.6 %   Est. average glucose Bld gHb Est-mCnc 120 mg/dL      Assessment & Plan:   Problem List Items Addressed This Visit      Nervous and Auditory   Hearing loss due to cerumen impaction, left    Acute -- irrigation and manual removal cerumen left ear performed and tolerated well with report improvement of symptoms post procedure.  Instructed patient to avoid Q-tips at home.  May use OTC Debrox as needed for cerumen removal.  Return  to office for any  return of symptoms.          Follow up plan: Return if symptoms worsen or fail to improve.

## 2019-07-26 ENCOUNTER — Encounter: Payer: Medicaid Other | Admitting: Student in an Organized Health Care Education/Training Program

## 2019-07-31 ENCOUNTER — Encounter: Payer: Medicaid Other | Admitting: Student in an Organized Health Care Education/Training Program

## 2019-08-06 ENCOUNTER — Other Ambulatory Visit: Payer: Self-pay

## 2019-08-08 ENCOUNTER — Ambulatory Visit: Payer: Medicaid Other | Admitting: Student in an Organized Health Care Education/Training Program

## 2019-08-21 ENCOUNTER — Encounter: Payer: Medicaid Other | Admitting: Student in an Organized Health Care Education/Training Program

## 2019-08-25 ENCOUNTER — Encounter: Payer: Self-pay | Admitting: Emergency Medicine

## 2019-08-25 ENCOUNTER — Emergency Department: Payer: Medicaid Other

## 2019-08-25 ENCOUNTER — Emergency Department
Admission: EM | Admit: 2019-08-25 | Discharge: 2019-08-27 | Disposition: A | Discharge status: Home / Self Care | Payer: Medicaid Other | Attending: Emergency Medicine | Admitting: Emergency Medicine

## 2019-08-25 ENCOUNTER — Other Ambulatory Visit: Payer: Self-pay

## 2019-08-25 DIAGNOSIS — Z5321 Procedure and treatment not carried out due to patient leaving prior to being seen by health care provider: Secondary | ICD-10-CM | POA: Diagnosis not present

## 2019-08-25 DIAGNOSIS — R2232 Localized swelling, mass and lump, left upper limb: Secondary | ICD-10-CM | POA: Insufficient documentation

## 2019-08-25 DIAGNOSIS — R2242 Localized swelling, mass and lump, left lower limb: Secondary | ICD-10-CM | POA: Insufficient documentation

## 2019-08-25 NOTE — ED Triage Notes (Signed)
Pt to ED via POV for left leg swelling. Pt states that symptoms started a few days ago. Pt states that she got her first COVID shot on 7/20, unsure if symptoms are related. Pt also got stung by a yellow jacket about 30 minutes PTA. Pt has swelling in her left hand. Pt is in NAD.

## 2019-08-25 NOTE — ED Notes (Signed)
Pt not present upon rounding. Wheelchair empty. Pt not outside or in the bathroom.

## 2019-08-25 NOTE — ED Notes (Signed)
Pt to the desk asking about wait times. Pt updated to the best of this RNs ability. Pt verbalized understanding but then was heard calling family asking them to come pick her up.

## 2019-08-25 NOTE — ED Notes (Signed)
Pt updated on wait time after asking how much longer she will have to wait.

## 2019-09-13 ENCOUNTER — Other Ambulatory Visit: Payer: Self-pay | Admitting: Family Medicine

## 2019-09-13 NOTE — Telephone Encounter (Signed)
2

## 2019-10-08 ENCOUNTER — Ambulatory Visit (INDEPENDENT_AMBULATORY_CARE_PROVIDER_SITE_OTHER): Payer: Medicaid Other | Admitting: Vascular Surgery

## 2019-10-13 ENCOUNTER — Other Ambulatory Visit: Payer: Self-pay

## 2019-10-13 ENCOUNTER — Emergency Department: Payer: Medicaid Other

## 2019-10-13 ENCOUNTER — Emergency Department
Admission: EM | Admit: 2019-10-13 | Discharge: 2019-10-13 | Disposition: A | Discharge status: Home / Self Care | Payer: Medicaid Other | Attending: Emergency Medicine | Admitting: Emergency Medicine

## 2019-10-13 DIAGNOSIS — J449 Chronic obstructive pulmonary disease, unspecified: Secondary | ICD-10-CM | POA: Insufficient documentation

## 2019-10-13 DIAGNOSIS — R519 Headache, unspecified: Secondary | ICD-10-CM | POA: Diagnosis not present

## 2019-10-13 DIAGNOSIS — M791 Myalgia, unspecified site: Secondary | ICD-10-CM | POA: Diagnosis present

## 2019-10-13 DIAGNOSIS — I5032 Chronic diastolic (congestive) heart failure: Secondary | ICD-10-CM | POA: Diagnosis not present

## 2019-10-13 DIAGNOSIS — Z87891 Personal history of nicotine dependence: Secondary | ICD-10-CM | POA: Diagnosis not present

## 2019-10-13 DIAGNOSIS — R531 Weakness: Secondary | ICD-10-CM | POA: Insufficient documentation

## 2019-10-13 DIAGNOSIS — Z79899 Other long term (current) drug therapy: Secondary | ICD-10-CM | POA: Diagnosis not present

## 2019-10-13 DIAGNOSIS — Z7951 Long term (current) use of inhaled steroids: Secondary | ICD-10-CM | POA: Insufficient documentation

## 2019-10-13 DIAGNOSIS — R110 Nausea: Secondary | ICD-10-CM | POA: Diagnosis not present

## 2019-10-13 DIAGNOSIS — Z7982 Long term (current) use of aspirin: Secondary | ICD-10-CM | POA: Insufficient documentation

## 2019-10-13 DIAGNOSIS — N390 Urinary tract infection, site not specified: Secondary | ICD-10-CM | POA: Insufficient documentation

## 2019-10-13 DIAGNOSIS — I110 Hypertensive heart disease with heart failure: Secondary | ICD-10-CM | POA: Insufficient documentation

## 2019-10-13 LAB — URINALYSIS, COMPLETE (UACMP) WITH MICROSCOPIC
Bilirubin Urine: NEGATIVE
Glucose, UA: NEGATIVE mg/dL
Hgb urine dipstick: NEGATIVE
Ketones, ur: NEGATIVE mg/dL
Nitrite: NEGATIVE
Protein, ur: 30 mg/dL — AB
Specific Gravity, Urine: 1.03 (ref 1.005–1.030)
pH: 5 (ref 5.0–8.0)

## 2019-10-13 LAB — CBC WITH DIFFERENTIAL/PLATELET
Abs Immature Granulocytes: 0.02 10*3/uL (ref 0.00–0.07)
Basophils Absolute: 0 10*3/uL (ref 0.0–0.1)
Basophils Relative: 1 %
Eosinophils Absolute: 0.1 10*3/uL (ref 0.0–0.5)
Eosinophils Relative: 2 %
HCT: 48.9 % — ABNORMAL HIGH (ref 36.0–46.0)
Hemoglobin: 15.6 g/dL — ABNORMAL HIGH (ref 12.0–15.0)
Immature Granulocytes: 0 %
Lymphocytes Relative: 29 %
Lymphs Abs: 2.2 10*3/uL (ref 0.7–4.0)
MCH: 27.9 pg (ref 26.0–34.0)
MCHC: 31.9 g/dL (ref 30.0–36.0)
MCV: 87.3 fL (ref 80.0–100.0)
Monocytes Absolute: 0.7 10*3/uL (ref 0.1–1.0)
Monocytes Relative: 9 %
Neutro Abs: 4.5 10*3/uL (ref 1.7–7.7)
Neutrophils Relative %: 59 %
Platelets: 272 10*3/uL (ref 150–400)
RBC: 5.6 MIL/uL — ABNORMAL HIGH (ref 3.87–5.11)
RDW: 13.4 % (ref 11.5–15.5)
WBC: 7.6 10*3/uL (ref 4.0–10.5)
nRBC: 0 % (ref 0.0–0.2)

## 2019-10-13 LAB — COMPREHENSIVE METABOLIC PANEL
ALT: 13 U/L (ref 0–44)
AST: 23 U/L (ref 15–41)
Albumin: 3.8 g/dL (ref 3.5–5.0)
Alkaline Phosphatase: 63 U/L (ref 38–126)
Anion gap: 13 (ref 5–15)
BUN: 13 mg/dL (ref 6–20)
CO2: 25 mmol/L (ref 22–32)
Calcium: 10 mg/dL (ref 8.9–10.3)
Chloride: 102 mmol/L (ref 98–111)
Creatinine, Ser: 0.6 mg/dL (ref 0.44–1.00)
GFR calc Af Amer: >60 mL/min (ref >60)
GFR calc non Af Amer: >60 mL/min (ref >60)
Glucose, Bld: 101 mg/dL — ABNORMAL HIGH (ref 70–99)
Potassium: 3.3 mmol/L — ABNORMAL LOW (ref 3.5–5.1)
Sodium: 140 mmol/L (ref 135–145)
Total Bilirubin: 1 mg/dL (ref 0.3–1.2)
Total Protein: 7.8 g/dL (ref 6.5–8.1)

## 2019-10-13 LAB — PROTIME-INR
INR: 1 (ref 0.8–1.2)
Prothrombin Time: 12.8 seconds (ref 11.4–15.2)

## 2019-10-13 LAB — TROPONIN I (HIGH SENSITIVITY): Troponin I (High Sensitivity): 10 ng/L (ref <18)

## 2019-10-13 LAB — SEDIMENTATION RATE: Sed Rate: 4 mm/hr (ref 0–30)

## 2019-10-13 LAB — LIPASE, BLOOD: Lipase: 34 U/L (ref 11–51)

## 2019-10-13 MED ORDER — SULFAMETHOXAZOLE-TRIMETHOPRIM 800-160 MG PO TABS
1.0000 | ORAL_TABLET | Freq: Once | ORAL | Status: AC
  Administered 2019-10-13: 1 via ORAL
  Filled 2019-10-13: qty 1

## 2019-10-13 MED ORDER — ONDANSETRON HCL 4 MG/2ML IJ SOLN
4.0000 mg | Freq: Once | INTRAMUSCULAR | Status: AC
  Administered 2019-10-13: 4 mg via INTRAVENOUS
  Filled 2019-10-13: qty 2

## 2019-10-13 MED ORDER — SODIUM CHLORIDE 0.9 % IV BOLUS
1000.0000 mL | Freq: Once | INTRAVENOUS | Status: AC
  Administered 2019-10-13: 1000 mL via INTRAVENOUS

## 2019-10-13 MED ORDER — KETOROLAC TROMETHAMINE 30 MG/ML IJ SOLN
15.0000 mg | Freq: Once | INTRAMUSCULAR | Status: AC
  Administered 2019-10-13: 15 mg via INTRAVENOUS
  Filled 2019-10-13: qty 1

## 2019-10-13 MED ORDER — SULFAMETHOXAZOLE-TRIMETHOPRIM 800-160 MG PO TABS: 1.0000 | ORAL_TABLET | Freq: Two times a day (BID) | ORAL | 0 refills | Status: DC

## 2019-10-13 NOTE — ED Notes (Signed)
B/p cuff removed per patient's request.

## 2019-10-13 NOTE — ED Provider Notes (Signed)
[facility] Emergency Department Provider Note  Time seen: 6:09 PM  I have reviewed the triage vital signs and the nursing notes.   HISTORY  Chief Complaint Nausea, Emesis, and Chills   HPI [name] is a 54 y.o. female with a past medical history of atrial fibrillation, alcohol use, CHF, COPD, hypertension, presents to the emergency department with multiple complaints.  According to the patient on August 17 she received her first Covid vaccination as well as got stung by bee.  She states that evening she began feeling flulike symptoms which she describes as body aches generalized weakness intermittent headaches intermittent nausea decreased appetite.  States her symptoms have been ongoing for the past 1 month.  Patient states she has been seen by this emergency department, [facility] and her PCP over the past month with no findings.  Patient denies any exacerbation of her symptoms today but states they have not gotten better so she came back for evaluation.  Currently the patient appears well denies any fever at any point.  Denies any shortness of breath or cough.  States she had a negative Covid test last week.   Past Medical History:  Diagnosis Date  . A-fib (HCC) 2010  . Adrenal adenoma, left   . Alcohol abuse   . Allergy   . CHF (congestive heart failure) (HCC)   . COPD (chronic obstructive pulmonary disease) (HCC)   . Fatty liver   . GERD (gastroesophageal reflux disease)   . Gout   . Hypertension   . Mild cognitive impairment   . Non-healing non-surgical wound    right leg  . Obesity   . Pulmonary embolus (HCC)   . Sleep apnea    uses Cpap  . Stroke (HCC)   . Thyroid disease     Patient Active Problem List   Diagnosis Date Noted  . Hearing loss due to cerumen impaction, left 07/23/2019  . Lymphedema 12/06/2018  . IFG (impaired fasting glucose) 11/03/2018  . Hallucinations, visual 07/03/2018  . Unstable angina (HCC) 03/25/2018  . Gout  05/09/2017  . COPD (chronic obstructive pulmonary disease) (HCC) 04/10/2017  . Sleep apnea 04/10/2017  . History of pulmonary embolism 04/10/2017  . History of thyroid disease 04/10/2017  . Hyperlipidemia 12/31/2016  . Essential hypertension 05/26/2016  . Chronic diastolic heart failure (HCC) 05/26/2016  . Hypokalemia 05/26/2016  . A-fib (HCC) 01/26/2008    Past Surgical History:  Procedure Laterality Date  . bilateral wrist fractures  2010  . LEFT HEART CATH AND CORONARY ANGIOGRAPHY N/A 03/27/2018   Procedure: LEFT HEART CATH AND CORONARY ANGIOGRAPHY;  Surgeon: Marcina Millard, MD;  Location: [facility] INVASIVE CV LAB;  Service: Cardiovascular;  Laterality: N/A;  . lt ankle fracture  2002    Prior to Admission medications   Medication Sig Start Date End Date Taking? Authorizing Provider  albuterol (VENTOLIN HFA) 108 (90 Base) MCG/ACT inhaler Inhale 2 puffs into the lungs every 6 (six) hours as needed for wheezing or shortness of breath. 07/02/19   Particia Nearing, PA-C  allopurinol (ZYLOPRIM) 300 MG tablet Take 1 tablet (300 mg total) by mouth daily. 07/02/19   Particia Nearing, PA-C  amLODipine (NORVASC) 10 MG tablet Take 1 tablet (10 mg total) by mouth daily. 07/02/19   Particia Nearing, PA-C  aspirin EC 81 MG tablet Take 81 mg by mouth daily.    [provider]  atorvastatin (LIPITOR) 80 MG tablet Take 1 tablet (80 mg total) by mouth daily.  07/05/19   Volney American, PA-C  cloNIDine (CATAPRES) 0.1 MG tablet Take 1 tablet (0.1 mg total) by mouth 2 (two) times daily. 07/02/19   Volney American, PA-C  colchicine 0.6 MG tablet Take 1 tablet (0.6 mg total) by mouth 2 (two) times daily. As needed for gout flares 05/15/19   Volney American, PA-C  furosemide (LASIX) 40 MG tablet Take 1 tablet (40 mg total) by mouth daily. 07/02/19 09/30/19  Volney American, PA-C  gabapentin (NEURONTIN) 300 MG capsule 300 mg qAM, 600 mg qhs 02/01/19   Gillis Santa, MD   hydrALAZINE (APRESOLINE) 50 MG tablet TAKE 1 TABLET BY MOUTH THREE TIMES DAILY 09/13/19   Cannady, Henrine Screws T, NP  HYDROcodone-acetaminophen (NORCO/VICODIN) 5-325 MG tablet Take 1 tablet by mouth 3 (three) times daily as needed for moderate pain. 07/02/19   Volney American, PA-C  lisinopril (ZESTRIL) 40 MG tablet Take 1 tablet (40 mg total) by mouth daily. 07/02/19   Volney American, PA-C  LORazepam (ATIVAN) 0.5 MG tablet Take 1 tablet (0.5 mg total) by mouth every 8 (eight) hours as needed for anxiety. 01/04/19 01/04/20  Earleen Newport, MD  metoprolol (TOPROL XL) 200 MG 24 hr tablet Take 1 tablet (200 mg total) by mouth daily. 07/02/19   Volney American, PA-C  ondansetron (ZOFRAN ODT) 4 MG disintegrating tablet Take 1 tablet (4 mg total) by mouth every 8 (eight) hours as needed. 04/25/18   Eula Listen, MD  pantoprazole (PROTONIX) 40 MG tablet Take 1 tablet (40 mg total) by mouth daily. 07/02/19   Volney American, PA-C  potassium chloride SA (KLOR-CON M20) 20 MEQ tablet Take 1 tablet (20 mEq total) by mouth 3 (three) times daily. 07/05/19   Volney American, PA-C  sertraline (ZOLOFT) 100 MG tablet Take 1 tablet (100 mg total) by mouth daily. 07/02/19   Volney American, PA-C  tiotropium (SPIRIVA HANDIHALER) 18 MCG inhalation capsule Place 1 capsule (18 mcg total) into inhaler and inhale daily. 07/02/19   Volney American, PA-C    Allergies  Allergen Reactions  . Morphine And Related Hives  . Penicillins Other (See Comments)    Painful urination Has patient had a PCN reaction causing immediate rash, facial/tongue/throat swelling, SOB or lightheadedness with hypotension: yes Has patient had a PCN reaction causing severe rash involving mucus membranes or skin necrosis: no Has patient had a PCN reaction that required hospitalization no Has patient had a PCN reaction occurring within the last 10 years: no If all of the above answers are "NO", then may  proceed with Cephalosporin use.     Family History  Problem Relation Age of Onset  . Prostate cancer Father   . Rectal cancer Sister   . Breast cancer Sister   . Cancer Maternal Grandmother   . Cancer Maternal Grandfather     Social History Social History   Tobacco Use  . Smoking status: Former Smoker    Types: Cigarettes    Quit date: 01/26/2016    Years since quitting: 3.7  . Smokeless tobacco: Never Used  Vaping Use  . Vaping Use: Never used  Substance Use Topics  . Alcohol use: Yes    Comment: Occassionally  . Drug use: No    Review of Systems Constitutional: Negative for fever.  Positive for generalized fatigue Cardiovascular: Negative for chest pain. Respiratory: Negative for shortness of breath. Gastrointestinal: Negative for abdominal pain positive for intermittent nausea. Genitourinary: Negative for urinary compaints Musculoskeletal: Positive  for generalized body aches Neurological: Intermittent headaches. All other ROS negative  ____________________________________________   PHYSICAL EXAM:  VITAL SIGNS: ED Triage Vitals  Enc Vitals Group     BP 10/13/19 1425 (!) 121/92     Pulse Rate 10/13/19 1425 100     Resp 10/13/19 1425 19     Temp 10/13/19 1425 98.3 F (36.8 C)     Temp Source 10/13/19 1425 Oral     SpO2 10/13/19 1425 97 %     Weight 10/13/19 1426 235 lb 0.2 oz (106.6 kg)     Height 10/13/19 1426 5' 9"  (1.753 m)     Head Circumference --      Peak Flow --      Pain Score 10/13/19 1425 8     Pain Loc --      Pain Edu? --      Excl. in Advance? --     Constitutional: Alert and oriented. Well appearing and in no distress. Eyes: Normal exam ENT      Head: Normocephalic and atraumatic.      Mouth/Throat: Mucous membranes are moist. Cardiovascular: Normal rate, regular rhythm. No murmur Respiratory: Normal respiratory effort without tachypnea nor retractions. Breath sounds are clear  Gastrointestinal: Soft and nontender. No distention.   Musculoskeletal: Nontender with normal range of motion in all extremities.  Neurologic:  Normal speech and language. No gross focal neurologic deficits Skin:  Skin is warm, dry and intact.  Psychiatric: Mood and affect are normal.   ____________________________________________    EKG  EKG viewed and interpreted by myself shows sinus tachycardia 104 bpm with a narrow QRS, normal axis, normal intervals, nonspecific ST changes.  ____________________________________________    RADIOLOGY  Chest x-ray is negative for acute abnormality.  ____________________________________________   INITIAL IMPRESSION / ASSESSMENT AND PLAN / ED COURSE  Pertinent labs & imaging results that were available during my care of the patient were reviewed by me and considered in my medical decision making (see chart for details).   Patient presents emergency department for multiple complaints of generalized fatigue weakness body aches headache nausea ongoing over the past 1 month.  Overall the patient appears well reassuring vitals, reassuring and normal physical exam, reassuring EKG, reassuring lab work.  No chest pain or shortness of breath.  I have added on ESR to evaluate for possible inflammatory conditions.  I did discuss the patient needs to follow-up with her PCP for further work-up and evaluation.  Patient agreeable.  We will IV hydrate, treat nausea and treat with Toradol while awaiting results.  Patient agreeable.  Blood work work is largely nonrevealing including ESR which is normal.  X-ray is nonrevealing for acute abnormality.  Patient's urinalysis does indicate urinary tract infection.  I have added on a urine culture, we will discharge on antibiotic have the patient follow-up with her PCP.  Patient agreeable to plan of care.  MAKYNNA MANOCCHIO was evaluated in Emergency Department on 10/13/2019 for the symptoms described in the history of present illness. She was evaluated in the context of the global  COVID-19 pandemic, which necessitated consideration that the patient might be at risk for infection with the SARS-CoV-2 virus that causes COVID-19. Institutional protocols and algorithms that pertain to the evaluation of patients at risk for COVID-19 are in a state of rapid change based on information released by regulatory bodies including the CDC and federal and state organizations. These policies and algorithms were followed during the patient's care in the ED.  ____________________________________________  FINAL CLINICAL IMPRESSION(S) / ED DIAGNOSES  Weakness Urinary tract infection    Harvest Dark, MD 10/13/19 1946

## 2019-10-13 NOTE — ED Triage Notes (Signed)
Patient arrived via EMS. Patient is ambulatory and AOx4. Patient chief complaint is Nausea/Vomiting, and Chills. All of this began after patient was stung by a bee as well as the same day patient received 2nd COVID vaccination [name]) which was September 11, 2019. Patient states she is also having generalized body aches as well and has a hard time eating or drinking anything. Patient states she is SOB for the past 2x days and some chest pain as well.

## 2019-10-15 LAB — URINE CULTURE

## 2019-10-25 ENCOUNTER — Other Ambulatory Visit: Payer: Self-pay | Admitting: Family Medicine

## 2019-10-25 NOTE — Telephone Encounter (Signed)
Requested medication (s) are due for refill today: yes  Requested medication (s) are on the active medication list:yes  Last refill: 05/15/19  #30  0 refills  Future visit scheduled: yes  Notes to clinic: Uric acid needs rechecked    Requested Prescriptions  Pending Prescriptions Disp Refills   colchicine 0.6 MG tablet [Pharmacy Med Name: COLCHICINE 0.6 MG TABS 0.6 Tablet] 30 tablet 1    Sig: TAKE ONE (1) TABLET BY MOUTH TWICE DAILY AS NEEDED FOR GOUT FLARES      Endocrinology:  Gout Agents Failed - 10/25/2019 11:58 AM      Failed - Uric Acid in normal range and within 360 days    Uric Acid  Date Value Ref Range Status  07/02/2019 8.2 (H) 3.0 - 7.2 mg/dL Final    Comment:               Therapeutic target for gout patients: <6.0          Passed - Cr in normal range and within 360 days    Creatinine  Date Value Ref Range Status  01/15/2014 1.36 (H) 0.60 - 1.30 mg/dL Final   Creatinine, Ser  Date Value Ref Range Status  10/13/2019 0.60 0.44 - 1.00 mg/dL Final          Passed - Valid encounter within last 12 months    Recent Outpatient Visits           3 months ago Hearing loss due to cerumen impaction, left   [place], [place] T, NP   3 months ago Essential hypertension   [facility] Volney American, [place]   4 months ago Low back pain due to bilateral sciatica   [place], PA-C   5 months ago Low back pain due to bilateral sciatica   [place], [place]   9 months ago Erroneous encounter - disregard   [facility] Volney American, [place]       Future Appointments             In 1 week Eulogio Bear, NP [facility]

## 2019-11-02 ENCOUNTER — Telehealth: Payer: Medicaid Other | Admitting: Nurse Practitioner

## 2019-11-15 ENCOUNTER — Other Ambulatory Visit: Payer: Self-pay

## 2019-11-15 ENCOUNTER — Telehealth: Payer: Self-pay

## 2019-11-15 ENCOUNTER — Ambulatory Visit (INDEPENDENT_AMBULATORY_CARE_PROVIDER_SITE_OTHER): Payer: Medicaid Other | Admitting: Unknown Physician Specialty

## 2019-11-15 ENCOUNTER — Encounter: Payer: Self-pay | Admitting: Unknown Physician Specialty

## 2019-11-15 VITALS — BP 143/100 | HR 81 | Temp 97.9°F | Ht 70.0 in | Wt 224.0 lb

## 2019-11-15 DIAGNOSIS — E876 Hypokalemia: Secondary | ICD-10-CM

## 2019-11-15 DIAGNOSIS — I10 Essential (primary) hypertension: Secondary | ICD-10-CM | POA: Diagnosis not present

## 2019-11-15 DIAGNOSIS — E278 Other specified disorders of adrenal gland: Secondary | ICD-10-CM

## 2019-11-15 DIAGNOSIS — Z1159 Encounter for screening for other viral diseases: Secondary | ICD-10-CM | POA: Insufficient documentation

## 2019-11-15 DIAGNOSIS — I5032 Chronic diastolic (congestive) heart failure: Secondary | ICD-10-CM

## 2019-11-15 DIAGNOSIS — R7301 Impaired fasting glucose: Secondary | ICD-10-CM

## 2019-11-15 DIAGNOSIS — I890 Lymphedema, not elsewhere classified: Secondary | ICD-10-CM

## 2019-11-15 NOTE — Assessment & Plan Note (Addendum)
Remains elevated, denies any stressors. Per pt. better in office than at home. Home readings 160/100's. Will check Renin/angiotensin level today. Recheck in 1 week.

## 2019-11-15 NOTE — Assessment & Plan Note (Signed)
States she wears compression hose. Continue to use lymph machine daily. Elevate legs when sitting. AMB referral to wound care center.

## 2019-11-15 NOTE — Telephone Encounter (Signed)
Please let her know that she had a small mass on her adrenal gland when she went to [facility] ER. It's nothing to worry about now, but we do want to make sure it's not anything serious, so I've put in an order for her to get an additional CT to see it better- they should be calling her and she should not be anxious when they do. Thanks!     CT abdomen and pelvis with IV contrast   Comparison: None.   Indication: diffuse myalgias, chest and abdominal pain., R10.84  Generalized abdominal pain.   Technique: CT imaging was performed of the abdomen and pelvis following  the administration of intravenous contrast. Iodinated contrast was used  due to the indications for the examination, to improve disease detection  and further define anatomy. Coronal and sagittal reformatted images were  generated and reviewed.   FINDINGS:   Lower chest: The heart appears mildly enlarged, incompletely visualized.   Liver: No focal lesions. Portal vein is patent.  Gallbladder and Bile Ducts: Decompressed. No biliary ductal dilation.   Spleen: Normal.  Pancreas: Homogeneous enhancement. No ductal dilation.     Adrenals: Normal right adrenal gland 2.8 x 1.6 cm left adrenal mass,  measuring 109 Hounsfield units.  Kidneys: Symmetric enhancement. Bilateral small nonobstructing stones. No  hydronephrosis. Left intrarenal hypodensity too small to fully  characterize.  Bladder: Decompressed.   Reproductive organs: The uterus is present.   GI Tract: No small bowel obstruction. Diverticulosis . Normal appendix.   Vasculature: Normal caliber of the aorta. Calcified atherosclerotic  changes.   Lymph nodes: No lymphadenopathy.  Peritoneum: No intraperitoneal free air. No significant free fluid.  Bones and Soft Tissues: Irregularity of the right pubic body suggesting  previous trauma. Inferior plate deformity of L3 with mild retropulsion of  the inferior endplate. Nonspecific sclerosis within L2 measuring  1.7 cm  best seen on series 6, image 66. Normal soft tissues.   IMPRESSION:  1. Indeterminate enhancing 2.8 cm left adrenal mass. Adrenal CT workup  recommended.  2. Indeterminate round sclerosis measuring 1.5 cm within L2. Bone scan can  be performed, if indicated. This was placed in the unexpected findings  folder.  3. Chronic appearing right pubic body fracture and likely L3 inferior  endplate fracture.  4. Tiny nonobstructing renal stones.

## 2019-11-15 NOTE — Assessment & Plan Note (Addendum)
Bil LE edema, left greater than right. Currently on CCB. Denies SOB. Has not followed up with HF clinic, pass due. Referral resent. Continue current regimen. CMET ordered today. Recheck in 1 month.

## 2019-11-15 NOTE — Progress Notes (Signed)
BP (!) 143/100 (BP Location: Left Arm, Patient Position: Sitting)   Pulse 81   Temp 97.9 F (36.6 C) (Oral)   Ht 5\' 10"  (1.778 m)   Wt 224 lb (101.6 kg)   SpO2 97%   BMI 32.14 kg/m    Subjective:    Patient ID: [name], female    DOB: 06/06/1965, 54 y.o.   MRN: [id number]  HPI: [name] is a 54 y.o. female  Chief Complaint  Patient presents with  . Weight Loss    pt got sick and lost weight, pt is ok with the weight loss  . Pain    X1 month, both legs, mostly left leg, feels like pain is in bones, feels like pins and needles, legs feel like they are burning  . Leg Swelling    X1 month both legs, left leg is worse, elevating legs doesnt help much   . Hepatitis B    wants hep b for work/ has paper to fill out    Leg swelling:  History of bil. Leg swelling. Past month left worse than right. States she wears compression hoses, presents without them today. Has lymph pump at home uses BID one hour at a time. Denies problems with the pump. States she watches sodium intake. Currently taking CCB. Per pt. Has been seen by pain management.   Leg Pain:  Bil. leg pain, pain worse on left. States feels like it's on fire with needles and at times is erythematous. Has hx of IFG. Taking Gapapentin QD without relief. Denies fevers or chills.   Hepatitis B Vaccine: Starting a new job next week and needing Hep B vaccine, forms in hand.     Relevant past medical, surgical, family and social history reviewed and updated as indicated. Interim medical history since our last visit reviewed. Allergies and medications reviewed and updated.  Review of Systems  Constitutional: Negative.        Since last month, appetite has returned and weight loss has subsided.   HENT: Negative.   Eyes: Negative.   Respiratory: Negative.   Cardiovascular: Negative.   Gastrointestinal: Negative.   Endocrine: Negative.   Genitourinary: Negative.   Allergic/Immunologic: Negative.   Neurological:  Negative.   Hematological: Negative.   Psychiatric/Behavioral: Negative.     Per HPI unless specifically indicated above     Objective:    BP (!) 143/100 (BP Location: Left Arm, Patient Position: Sitting)   Pulse 81   Temp 97.9 F (36.6 C) (Oral)   Ht 5\' 10"  (1.778 m)   Wt 224 lb (101.6 kg)   SpO2 97%   BMI 32.14 kg/m   Wt Readings from Last 3 Encounters:  11/15/19 224 lb (101.6 kg)  10/13/19 235 lb 0.2 oz (106.6 kg)  08/25/19 (!) 224 lb (101.6 kg)    Physical Exam Vitals reviewed.  Constitutional:      Appearance: Normal appearance.  HENT:     Head: Normocephalic.  Cardiovascular:     Rate and Rhythm: Normal rate. Rhythm irregular.     Heart sounds: Normal heart sounds.  Pulmonary:     Effort: Pulmonary effort is normal.     Breath sounds: Normal breath sounds.  Abdominal:     Palpations: Abdomen is soft.  Musculoskeletal:     Cervical back: Normal range of motion.     Left lower leg: Edema present.  Skin:    General: Skin is warm and dry.     Comments: Left leg  edematous from knee down to foot.   Neurological:     General: No focal deficit present.     Mental Status: She is alert and oriented to person, place, and time.  Psychiatric:        Mood and Affect: Mood normal.        Behavior: Behavior normal.        Thought Content: Thought content normal.    Past Medical History:  Diagnosis Date  . A-fib ([place]) 2010  . Adrenal adenoma, left   . Alcohol abuse   . Allergy   . CHF (congestive heart failure) ([place])   . COPD (chronic obstructive pulmonary disease) ([place])   . Fatty liver   . GERD (gastroesophageal reflux disease)   . Gout   . Hypertension   . Mild cognitive impairment   . Non-healing non-surgical wound    right leg  . Obesity   . Pulmonary embolus ([place])   . Sleep apnea    uses Cpap  . Stroke ([place])   . Thyroid disease          Assessment & Plan:   Problem List Items Addressed This Visit      Unprioritized   Chronic diastolic  heart failure (HCC) (Chronic)    Bil LE edema, left greater than right. Currently on CCB. Denies SOB. Has not followed up with HF clinic, pass due. Referral resent. Continue current regimen. CMET ordered today. Recheck in 1 month.       Relevant Medications   atorvastatin (LIPITOR) 40 MG tablet   Other Relevant Orders   AMB referral to CHF clinic   Essential hypertension - Primary (Chronic)    Remains elevated, denies any stressors. Per pt. better in office than at home. Home readings 160/100's. Will check Renin/angiotensin level today. Recheck in 1 week.       Relevant Medications   atorvastatin (LIPITOR) 40 MG tablet   Other Relevant Orders   Hgb A1c w/o eAG   Comprehensive metabolic panel   Aldosterone + renin activity w/ ratio   Hypokalemia    Potassium on 9/18 was 3.3. Currently taking potassium 38meq. Recheck BMET today, adjust accordingly.       IFG (impaired fasting glucose)    Pt states wt loss of 31 lbs since August.  Continue carbohydrate diet  modifications and exercise. Will check A1c today.       Lymphedema    States she wears compression hose. Continue to use lymph machine daily. Elevate legs when sitting. AMB referral to wound care center.       Relevant Orders   AMB referral to wound care center   Need for hepatitis B screening test    Hep B vaccine needed for new employment. Hep B surface antibody screeing test today, will f/u at visit next week. Bring employment Hep Bvaccine form next week.      Relevant Orders   Hepatitis B Surface AntiBODY       Follow up plan: Return in about 1 week (around 11/22/2019).

## 2019-11-15 NOTE — Telephone Encounter (Signed)
Copied from [place] [id number]. Topic: General - Other >> Nov 15, 2019  3:34 PM Greggory Keen D wrote: Reason for CRM: Amy a patient navigator  [facility] called saying they were faxing a CT report for the patient.  She came to the ED at [facility] and had a CT done which showed 2 mass.     They recommend a /fu CT be done for the patient.

## 2019-11-15 NOTE — Telephone Encounter (Signed)
Called pt with results. Mass on adrenal gland will schedule another CT scan to see it better. Pt verbalized understanding and stated that someone from radiology had called her with the results as well. Pt verbalized understanding.  KP

## 2019-11-15 NOTE — Assessment & Plan Note (Addendum)
Pt states wt loss of 31 lbs since August.  Continue carbohydrate diet  modifications and exercise. Will check A1c today.

## 2019-11-15 NOTE — Assessment & Plan Note (Signed)
Potassium on 9/18 was 3.3. Currently taking potassium 30meq. Recheck BMET today, adjust accordingly.

## 2019-11-15 NOTE — Assessment & Plan Note (Signed)
Hep B vaccine needed for new employment. Hep B surface antibody screeing test today, will f/u at visit next week. Bring employment Hep Bvaccine form next week.

## 2019-11-16 LAB — COMPREHENSIVE METABOLIC PANEL

## 2019-11-16 LAB — HEPATITIS B SURFACE ANTIBODY,QUALITATIVE: Hep B Surface Ab, Qual: NONREACTIVE

## 2019-11-21 LAB — COMPREHENSIVE METABOLIC PANEL

## 2019-11-23 ENCOUNTER — Other Ambulatory Visit: Payer: Self-pay | Admitting: Family Medicine

## 2019-11-23 LAB — COMPREHENSIVE METABOLIC PANEL

## 2019-11-23 LAB — ALDOSTERONE + RENIN ACTIVITY W/ RATIO
ALDOS/RENIN RATIO: >10.8 (ref 0.0–30.0)
ALDOSTERONE: 1.8 ng/dL (ref 0.0–30.0)
Renin: <0.167 ng/mL/hr — ABNORMAL LOW (ref 0.167–5.380)

## 2019-11-23 LAB — HGB A1C W/O EAG: Hgb A1c MFr Bld: 6.1 % — ABNORMAL HIGH (ref 4.8–5.6)

## 2019-11-27 ENCOUNTER — Ambulatory Visit: Payer: Medicaid Other | Admitting: Family

## 2019-11-27 NOTE — Progress Notes (Signed)
HPI   [name] is a 54 y/o female with a history of pulmonary embolus, obstructive sleep apnea, atrial fibrillation, COPD, gout, HTN, CVA, remote tobacco use and chronic heart failure.   Echo report from 03/26/2018 reviewed and showed an EF of 60-65% along with mild/ moderate TR. Echo report from 06/17/16 reviewed and showed an EF of 55-65%. Had a pharmacologic stress trest done 09/12/12 and had an estimated EF of 49%.   Catheterization done 03/27/2018 showed normal coronary arteries and normal left ventricular function.  Was in the ED 10/28/19 due to fatigue and decreased appetite and concern about having lupus. Was in the ED 10/13/19 due to UTI where she was treated and released.   She presents today for a follow-up visit although hasn't been seen in over 1 year. She presents with a chief complaint of minimal fatigue upon moderate exertion. She describes this as chronic in nature having been present for several years. She has associated shortness of breath, pedal edema, palpitations, abdominal pain and leg pain along with this. She denies any difficulty sleeping, dizziness, abdominal distention, chest pain or cough.   Has had a difficult few months and had a lot of weight loss due to her inability to keep anything down. Says that her appetite is returning and she's slowly feeling better.   Did take her medication today about 4 hours ago but says that her BP "always stays high". Says that she has to let her potassium tablets dissolve because she can't swallow them whole.   Past Medical History:  Diagnosis Date  . A-fib ([place]) 2010  . Adrenal adenoma, left   . Alcohol abuse   . Allergy   . CHF (congestive heart failure) ([place])   . COPD (chronic obstructive pulmonary disease) ([place])   . Fatty liver   . GERD (gastroesophageal reflux disease)   . Gout   . Hypertension   . Mild cognitive impairment   . Non-healing non-surgical wound    right leg  . Obesity   . Pulmonary embolus ([place])   . Sleep  apnea    uses Cpap  . Stroke ([place])   . Thyroid disease    Past Surgical History:  Procedure Laterality Date  . bilateral wrist fractures  2010  . LEFT HEART CATH AND CORONARY ANGIOGRAPHY N/A 03/27/2018   Procedure: LEFT HEART CATH AND CORONARY ANGIOGRAPHY;  Surgeon: Isaias Cowman, MD;  Location: [facility];  Service: Cardiovascular;  Laterality: N/A;  . lt ankle fracture  2002   Family History  Problem Relation Age of Onset  . Prostate cancer Father   . Rectal cancer Sister   . Breast cancer Sister   . Cancer Maternal Grandmother   . Cancer Maternal Grandfather    Social History   Tobacco Use  . Smoking status: Former Smoker    Types: Cigarettes    Quit date: 01/26/2016    Years since quitting: 3.8  . Smokeless tobacco: Never Used  Substance Use Topics  . Alcohol use: Yes    Comment: Occassionally   Allergies  Allergen Reactions  . Morphine And Related Hives  . Penicillins Other (See Comments)    Painful urination Has patient had a PCN reaction causing immediate rash, facial/tongue/throat swelling, SOB or lightheadedness with hypotension: yes Has patient had a PCN reaction causing severe rash involving mucus membranes or skin necrosis: no Has patient had a PCN reaction that required hospitalization no Has patient had a PCN reaction occurring within the last 10 years:  no If all of the above answers are "NO", then may proceed with Cephalosporin use.    Prior to Admission medications   Medication Sig Start Date End Date Taking? Authorizing Provider  albuterol (VENTOLIN HFA) 108 (90 Base) MCG/ACT inhaler Inhale 2 puffs into the lungs every 6 (six) hours as needed for wheezing or shortness of breath. 07/02/19  Yes Volney American, PA-C  allopurinol (ZYLOPRIM) 300 MG tablet Take 1 tablet (300 mg total) by mouth daily. 07/02/19  Yes Volney American, PA-C  amLODipine (NORVASC) 10 MG tablet Take 1 tablet (10 mg total) by mouth daily. 07/02/19  Yes Volney American, PA-C  aspirin EC 81 MG tablet Take 81 mg by mouth daily.   Yes [provider]  atorvastatin (LIPITOR) 40 MG tablet Take 40 mg by mouth daily. 09/21/19  Yes [provider]  cloNIDine (CATAPRES) 0.1 MG tablet Take 1 tablet (0.1 mg total) by mouth 2 (two) times daily. 07/02/19  Yes Volney American, PA-C  colchicine 0.6 MG tablet TAKE ONE (1) TABLET BY MOUTH TWICE DAILY AS NEEDED FOR GOUT FLARES 10/25/19  Yes Noemi Chapel A, NP  furosemide (LASIX) 40 MG tablet Take 1 tablet (40 mg total) by mouth daily. 07/02/19 11/28/19 Yes Volney American, PA-C  gabapentin (NEURONTIN) 300 MG capsule 300 mg qAM, 600 mg qhs 02/01/19  Yes Lateef, Bilal, MD  hydrALAZINE (APRESOLINE) 50 MG tablet TAKE 1 TABLET BY MOUTH THREE TIMES DAILY 09/13/19  Yes Cannady, Jolene T, NP  HYDROcodone-acetaminophen (NORCO/VICODIN) 5-325 MG tablet Take 1 tablet by mouth 3 (three) times daily as needed for moderate pain. 07/02/19  Yes Volney American, PA-C  lisinopril (ZESTRIL) 40 MG tablet Take 1 tablet (40 mg total) by mouth daily. 07/02/19  Yes Volney American, PA-C  LORazepam (ATIVAN) 0.5 MG tablet Take 1 tablet (0.5 mg total) by mouth every 8 (eight) hours as needed for anxiety. 01/04/19 01/04/20 Yes Earleen Newport, MD  metoprolol (TOPROL XL) 200 MG 24 hr tablet Take 1 tablet (200 mg total) by mouth daily. 07/02/19  Yes Volney American, PA-C  ondansetron (ZOFRAN ODT) 4 MG disintegrating tablet Take 1 tablet (4 mg total) by mouth every 8 (eight) hours as needed. 04/25/18  Yes Eula Listen, MD  pantoprazole (PROTONIX) 40 MG tablet TAKE 1 TABLET BY MOUTH EVERY DAY 11/23/19  Yes [name], NP  potassium chloride SA (KLOR-CON M20) 20 MEQ tablet Take 1 tablet (20 mEq total) by mouth 3 (three) times daily. 07/05/19  Yes Volney American, PA-C  sertraline (ZOLOFT) 100 MG tablet Take 1 tablet (100 mg total) by mouth daily. 07/02/19  Yes Volney American, PA-C   tiotropium (SPIRIVA HANDIHALER) 18 MCG inhalation capsule Place 1 capsule (18 mcg total) into inhaler and inhale daily. 07/02/19  Yes Volney American, PA-C     Review of Systems  Constitutional: Positive for fatigue (with moderate exertion). Negative for appetite change.  HENT: Negative for congestion and postnasal drip.   Eyes: Positive for visual disturbance (blurry vision).  Respiratory: Positive for shortness of breath. Negative for cough and chest tightness.   Cardiovascular: Positive for palpitations and leg swelling (left ankle). Negative for chest pain.  Gastrointestinal: Positive for abdominal pain (right side at times). Negative for abdominal distention.  Endocrine: Negative.   Genitourinary: Negative.   Musculoskeletal: Positive for arthralgias (legs hurt). Negative for back pain.  Skin: Negative.   Allergic/Immunologic: Negative.   Neurological: Negative for dizziness and light-headedness.  Hematological:  Negative for adenopathy. Does not bruise/bleed easily.  Psychiatric/Behavioral: Negative for dysphoric mood and sleep disturbance (wearing CPAP nightly; sleeping on 2 pillows). The patient is not nervous/anxious.    Vitals:   11/28/19 0905 11/28/19 0927  BP: (!) 160/109 (!) 152/98  Pulse: 96   Resp: 18   SpO2: 99%   Weight: 228 lb 6 oz (103.6 kg)   Height: 5\' 9"  (1.753 m)    Wt Readings from Last 3 Encounters:  11/28/19 228 lb 6 oz (103.6 kg)  11/15/19 224 lb (101.6 kg)  10/13/19 235 lb 0.2 oz (106.6 kg)   Lab Results  Component Value Date   CREATININE 0.60 10/13/2019   CREATININE 0.73 07/02/2019   CREATININE 0.70 01/04/2019     Physical Exam Vitals and nursing note reviewed.  Constitutional:      Appearance: She is well-developed.  HENT:     Head: Normocephalic and atraumatic.  Neck:     Vascular: No JVD.  Cardiovascular:     Rate and Rhythm: Normal rate and regular rhythm.  Pulmonary:     Effort: Pulmonary effort is normal.     Breath  sounds: No wheezing or rales.  Abdominal:     General: There is no distension.     Palpations: Abdomen is soft.     Tenderness: There is no abdominal tenderness.  Musculoskeletal:        General: No tenderness.     Cervical back: Normal range of motion and neck supple.     Right lower leg: Edema (trace pitting) present.     Left lower leg: Edema (1+ pitting) present.  Skin:    General: Skin is warm and dry.  Neurological:     Mental Status: She is alert and oriented to person, place, and time.  Psychiatric:        Behavior: Behavior normal.        Thought Content: Thought content normal.    Assessment & Plan:  1: Chronic heart failure with preserved ejection fraction- - NYHA class II - euvolemic today - weight slowly rising as her appetite has improved; reminded to call for an overnight weight gain of >2 pounds or a weekly weight gain of >5 pounds.  - not adding salt to her food. Reminded to closely follow a 2000mg  sodium diet  - saw cardiology Margarito Courser) 06/18/16 - wearing oxygen at 2L during the day as needed - echo scheduled for 12/05/19 - BNP 10/31/2018 was 59.0 - received both covid vaccines - has not gotten her flu vaccine for this season as she's been very sick since her last covid vaccine  2: HTN- - BP elevated although slightly better upon recheck with manual cuff; will add spironolactone 25mg  daily - may be able to decrease potassium supplements since she's having to dissolve them - will get BMP at next visit in 2 weeks - saw PCP Julian Hy) 11/15/19 - reviewed BMP done 10/28/19 and it showed sodium 137, potassium 4.1, creatinine 0.9 and GFR 73  3: Obstructive sleep apnea- - wearing CPAP nightly - reports sleeping well  4: Lymphedema- - stage 2 - does wear compression socks although doesn't have them on today - encouraged her to elevate her legs when sitting for long periods of time - has compression boots that she wears 1-2 times/ week   Patient did not bring her  medications nor a list. Each medication was verbally reviewed with the patient and she was encouraged to bring the bottles to every visit to confirm  accuracy of list.  Return in 2 weeks or sooner for any questions/problems before then

## 2019-11-28 ENCOUNTER — Ambulatory Visit: Payer: Medicaid Other | Attending: Family | Admitting: Family

## 2019-11-28 ENCOUNTER — Other Ambulatory Visit: Payer: Self-pay

## 2019-11-28 ENCOUNTER — Encounter: Payer: Self-pay | Admitting: Family

## 2019-11-28 VITALS — BP 152/98 | HR 96 | Resp 18 | Ht 69.0 in | Wt 228.4 lb

## 2019-11-28 DIAGNOSIS — Z7982 Long term (current) use of aspirin: Secondary | ICD-10-CM | POA: Diagnosis not present

## 2019-11-28 DIAGNOSIS — G4733 Obstructive sleep apnea (adult) (pediatric): Secondary | ICD-10-CM | POA: Diagnosis not present

## 2019-11-28 DIAGNOSIS — Z86711 Personal history of pulmonary embolism: Secondary | ICD-10-CM | POA: Insufficient documentation

## 2019-11-28 DIAGNOSIS — Z885 Allergy status to narcotic agent status: Secondary | ICD-10-CM | POA: Insufficient documentation

## 2019-11-28 DIAGNOSIS — Z880 Allergy status to penicillin: Secondary | ICD-10-CM | POA: Diagnosis not present

## 2019-11-28 DIAGNOSIS — Z87891 Personal history of nicotine dependence: Secondary | ICD-10-CM | POA: Insufficient documentation

## 2019-11-28 DIAGNOSIS — Z9981 Dependence on supplemental oxygen: Secondary | ICD-10-CM | POA: Diagnosis not present

## 2019-11-28 DIAGNOSIS — I4891 Unspecified atrial fibrillation: Secondary | ICD-10-CM | POA: Insufficient documentation

## 2019-11-28 DIAGNOSIS — Z79899 Other long term (current) drug therapy: Secondary | ICD-10-CM | POA: Insufficient documentation

## 2019-11-28 DIAGNOSIS — M109 Gout, unspecified: Secondary | ICD-10-CM | POA: Diagnosis not present

## 2019-11-28 DIAGNOSIS — I110 Hypertensive heart disease with heart failure: Secondary | ICD-10-CM | POA: Insufficient documentation

## 2019-11-28 DIAGNOSIS — I10 Essential (primary) hypertension: Secondary | ICD-10-CM

## 2019-11-28 DIAGNOSIS — Z8673 Personal history of transient ischemic attack (TIA), and cerebral infarction without residual deficits: Secondary | ICD-10-CM | POA: Insufficient documentation

## 2019-11-28 DIAGNOSIS — J449 Chronic obstructive pulmonary disease, unspecified: Secondary | ICD-10-CM | POA: Insufficient documentation

## 2019-11-28 DIAGNOSIS — I890 Lymphedema, not elsewhere classified: Secondary | ICD-10-CM | POA: Insufficient documentation

## 2019-11-28 DIAGNOSIS — Z87440 Personal history of urinary (tract) infections: Secondary | ICD-10-CM | POA: Insufficient documentation

## 2019-11-28 DIAGNOSIS — Z9989 Dependence on other enabling machines and devices: Secondary | ICD-10-CM | POA: Insufficient documentation

## 2019-11-28 DIAGNOSIS — I5032 Chronic diastolic (congestive) heart failure: Secondary | ICD-10-CM | POA: Insufficient documentation

## 2019-11-28 MED ORDER — SPIRONOLACTONE 25 MG PO TABS: 25.0000 mg | ORAL_TABLET | Freq: Every day | ORAL | 3 refills | Status: DC

## 2019-11-28 NOTE — Patient Instructions (Signed)
Continue weighing daily and call for an overnight weight gain of > 2 pounds or a weekly weight gain of >5 pounds. 

## 2019-11-29 ENCOUNTER — Ambulatory Visit: Payer: Medicaid Other | Admitting: Unknown Physician Specialty

## 2019-12-04 ENCOUNTER — Ambulatory Visit
Admission: RE | Admit: 2019-12-04 | Discharge: 2019-12-04 | Disposition: A | Discharge status: Home / Self Care | Payer: Medicaid Other | Source: Ambulatory Visit | Attending: Family Medicine | Admitting: Family Medicine

## 2019-12-04 ENCOUNTER — Other Ambulatory Visit: Payer: Self-pay

## 2019-12-04 DIAGNOSIS — E278 Other specified disorders of adrenal gland: Secondary | ICD-10-CM

## 2019-12-05 ENCOUNTER — Ambulatory Visit: Admission: RE | Admit: 2019-12-05 | Payer: Medicaid Other | Source: Ambulatory Visit

## 2019-12-12 ENCOUNTER — Ambulatory Visit: Payer: Medicaid Other | Admitting: Family

## 2019-12-21 ENCOUNTER — Other Ambulatory Visit: Payer: Self-pay | Admitting: Family Medicine

## 2019-12-21 ENCOUNTER — Other Ambulatory Visit: Payer: Self-pay | Admitting: Nurse Practitioner

## 2019-12-21 ENCOUNTER — Other Ambulatory Visit: Payer: Self-pay | Admitting: Unknown Physician Specialty

## 2019-12-26 NOTE — Progress Notes (Deleted)
HPI   [name] is a 54 y/o female with a history of pulmonary embolus, obstructive sleep apnea, atrial fibrillation, COPD, gout, HTN, CVA, remote tobacco use and chronic heart failure.   Echo report from 03/26/2018 reviewed and showed an EF of 60-65% along with mild/ moderate TR. Echo report from 06/17/16 reviewed and showed an EF of 55-65%. Had a pharmacologic stress trest done 09/12/12 and had an estimated EF of 49%.   Catheterization done 03/27/2018 showed normal coronary arteries and normal left ventricular function.  Was in the ED 10/28/19 due to fatigue and decreased appetite and concern about having lupus. Was in the ED 10/13/19 due to UTI where she was treated and released.   She presents today for a follow-up visit with a chief complaint of   Past Medical History:  Diagnosis Date  . A-fib ([place]) 2010  . Adrenal adenoma, left   . Alcohol abuse   . Allergy   . CHF (congestive heart failure) ([place])   . COPD (chronic obstructive pulmonary disease) ([place])   . Fatty liver   . GERD (gastroesophageal reflux disease)   . Gout   . Hypertension   . Mild cognitive impairment   . Non-healing non-surgical wound    right leg  . Obesity   . Pulmonary embolus ([place])   . Sleep apnea    uses Cpap  . Stroke ([place])   . Thyroid disease    Past Surgical History:  Procedure Laterality Date  . bilateral wrist fractures  2010  . LEFT HEART CATH AND CORONARY ANGIOGRAPHY N/A 03/27/2018   Procedure: LEFT HEART CATH AND CORONARY ANGIOGRAPHY;  Surgeon: Isaias Cowman, MD;  Location: [facility];  Service: Cardiovascular;  Laterality: N/A;  . lt ankle fracture  2002   Family History  Problem Relation Age of Onset  . Prostate cancer Father   . Rectal cancer Sister   . Breast cancer Sister   . Cancer Maternal Grandmother   . Cancer Maternal Grandfather    Social History   Tobacco Use  . Smoking status: Former Smoker    Types: Cigarettes    Quit date: 01/26/2016    Years since quitting:  3.9  . Smokeless tobacco: Never Used  Substance Use Topics  . Alcohol use: Yes    Comment: Occassionally   Allergies  Allergen Reactions  . Morphine And Related Hives  . Penicillins Other (See Comments)    Painful urination Has patient had a PCN reaction causing immediate rash, facial/tongue/throat swelling, SOB or lightheadedness with hypotension: yes Has patient had a PCN reaction causing severe rash involving mucus membranes or skin necrosis: no Has patient had a PCN reaction that required hospitalization no Has patient had a PCN reaction occurring within the last 10 years: no If all of the above answers are "NO", then may proceed with Cephalosporin use.       Review of Systems  Constitutional: Positive for fatigue (with moderate exertion). Negative for appetite change.  HENT: Negative for congestion and postnasal drip.   Eyes: Positive for visual disturbance (blurry vision).  Respiratory: Positive for shortness of breath. Negative for cough and chest tightness.   Cardiovascular: Positive for palpitations and leg swelling (left ankle). Negative for chest pain.  Gastrointestinal: Positive for abdominal pain (right side at times). Negative for abdominal distention.  Endocrine: Negative.   Genitourinary: Negative.   Musculoskeletal: Positive for arthralgias (legs hurt). Negative for back pain.  Skin: Negative.   Allergic/Immunologic: Negative.   Neurological: Negative for  dizziness and light-headedness.  Hematological: Negative for adenopathy. Does not bruise/bleed easily.  Psychiatric/Behavioral: Negative for dysphoric mood and sleep disturbance (wearing CPAP nightly; sleeping on 2 pillows). The patient is not nervous/anxious.       Physical Exam Vitals and nursing note reviewed.  Constitutional:      Appearance: She is well-developed.  HENT:     Head: Normocephalic and atraumatic.  Neck:     Vascular: No JVD.  Cardiovascular:     Rate and Rhythm: Normal rate and  regular rhythm.  Pulmonary:     Effort: Pulmonary effort is normal.     Breath sounds: No wheezing or rales.  Abdominal:     General: There is no distension.     Palpations: Abdomen is soft.     Tenderness: There is no abdominal tenderness.  Musculoskeletal:        General: No tenderness.     Cervical back: Normal range of motion and neck supple.     Right lower leg: Edema (trace pitting) present.     Left lower leg: Edema (1+ pitting) present.  Skin:    General: Skin is warm and dry.  Neurological:     Mental Status: She is alert and oriented to person, place, and time.  Psychiatric:        Behavior: Behavior normal.        Thought Content: Thought content normal.    Assessment & Plan:  1: Chronic heart failure with preserved ejection fraction- - NYHA class II - euvolemic today - weight slowly rising as her appetite has improved; reminded to call for an overnight weight gain of >2 pounds or a weekly weight gain of >5 pounds.  - weight 228.6 from last visit here 1 month ago - not adding salt to her food. Reminded to closely follow a 2000mg  sodium diet  - saw cardiology Margarito Courser) 06/18/16 - wearing oxygen at 2L during the day as needed - patient did not show for echo scheduled for 12/05/19 - BNP 10/31/2018 was 59.0 - received both covid vaccines - has not gotten her flu vaccine for this season as she's been very sick since her last covid vaccine  2: HTN- -  - saw PCP Julian Hy) 11/15/19 - reviewed BMP done 10/28/19 and it showed sodium 137, potassium 4.1, creatinine 0.9 and GFR 73 - will check BMP today and see if potassium supplement can be stopped since she's having to dissolve them  3: Obstructive sleep apnea- - wearing CPAP nightly - reports sleeping well  4: Lymphedema- - stage 2 - does wear compression socks although doesn't have them on today - encouraged her to elevate her legs when sitting for long periods of time - has compression boots that she wears 1-2 times/  week   Patient did not bring her medications nor a list. Each medication was verbally reviewed with the patient and she was encouraged to bring the bottles to every visit to confirm accuracy of list.

## 2019-12-27 ENCOUNTER — Telehealth: Payer: Self-pay | Admitting: Family

## 2019-12-27 ENCOUNTER — Ambulatory Visit: Payer: Medicaid Other | Admitting: Family

## 2019-12-27 NOTE — Telephone Encounter (Signed)
Patient did not show for her [facility] Failure [facility] appointment on 12/27/19. Will attempt to reschedule.

## 2019-12-28 ENCOUNTER — Other Ambulatory Visit: Payer: Self-pay

## 2019-12-28 ENCOUNTER — Other Ambulatory Visit: Payer: Self-pay | Admitting: Nurse Practitioner

## 2019-12-28 MED ORDER — COLCHICINE 0.6 MG PO TABS: ORAL_TABLET | ORAL | 1 refills | Status: DC

## 2019-12-28 NOTE — Telephone Encounter (Signed)
Received refill request for Colchicine 0.6mg  tabs  Last OV 11/15/19 No future appointment Last refill 12/05/19

## 2020-01-04 ENCOUNTER — Telehealth: Payer: Self-pay

## 2020-01-04 ENCOUNTER — Other Ambulatory Visit: Payer: Self-pay

## 2020-01-04 MED ORDER — HYDRALAZINE HCL 50 MG PO TABS
50.0000 mg | ORAL_TABLET | Freq: Three times a day (TID) | ORAL | 2 refills | Status: DC
Start: 2020-01-04 — End: 2020-06-26

## 2020-01-04 NOTE — Telephone Encounter (Signed)
Previous RL patient. Last seen by Malachy Mood 11/15/19.

## 2020-01-04 NOTE — Telephone Encounter (Signed)
error 

## 2020-01-07 ENCOUNTER — Ambulatory Visit: Payer: Medicaid Other | Admitting: Family

## 2020-01-07 ENCOUNTER — Telehealth: Payer: Self-pay | Admitting: Family

## 2020-01-07 NOTE — Telephone Encounter (Signed)
Patient did not show for her [facility] Failure Clinic appointment on 01/07/20. Will attempt to reschedule.

## 2020-01-23 ENCOUNTER — Ambulatory Visit: Payer: Medicaid Other | Admitting: Internal Medicine

## 2020-02-19 ENCOUNTER — Emergency Department: Payer: Medicaid Other

## 2020-02-19 ENCOUNTER — Other Ambulatory Visit: Payer: Self-pay

## 2020-02-19 ENCOUNTER — Encounter: Payer: Self-pay | Admitting: Emergency Medicine

## 2020-02-19 DIAGNOSIS — I5032 Chronic diastolic (congestive) heart failure: Secondary | ICD-10-CM | POA: Diagnosis not present

## 2020-02-19 DIAGNOSIS — J441 Chronic obstructive pulmonary disease with (acute) exacerbation: Secondary | ICD-10-CM | POA: Diagnosis not present

## 2020-02-19 DIAGNOSIS — Z7982 Long term (current) use of aspirin: Secondary | ICD-10-CM | POA: Insufficient documentation

## 2020-02-19 DIAGNOSIS — I110 Hypertensive heart disease with heart failure: Secondary | ICD-10-CM | POA: Insufficient documentation

## 2020-02-19 DIAGNOSIS — R0602 Shortness of breath: Secondary | ICD-10-CM | POA: Diagnosis present

## 2020-02-19 DIAGNOSIS — Z951 Presence of aortocoronary bypass graft: Secondary | ICD-10-CM | POA: Insufficient documentation

## 2020-02-19 DIAGNOSIS — R042 Hemoptysis: Secondary | ICD-10-CM | POA: Diagnosis not present

## 2020-02-19 DIAGNOSIS — Z7951 Long term (current) use of inhaled steroids: Secondary | ICD-10-CM | POA: Insufficient documentation

## 2020-02-19 DIAGNOSIS — Z79899 Other long term (current) drug therapy: Secondary | ICD-10-CM | POA: Insufficient documentation

## 2020-02-19 DIAGNOSIS — Z87891 Personal history of nicotine dependence: Secondary | ICD-10-CM | POA: Insufficient documentation

## 2020-02-19 NOTE — ED Triage Notes (Addendum)
EMS brings pt in from home for c/o [facility] & cough when lying supine; hx CHF, COPD, HTN, afib; pt to triage via w/c with no distress noted; pt reports awoke tonight with [facility] then started "gagging" and "threw up some blood"; denies any recent illness; also reports some left sided chest and throat pain; denies any abd pain or nausea O2 at 2l/min via [place] at all times at home; pt reports taking no anticoagulants at this time

## 2020-02-20 ENCOUNTER — Emergency Department
Admission: EM | Admit: 2020-02-20 | Discharge: 2020-02-20 | Disposition: A | Discharge status: Home / Self Care | Payer: Medicaid Other | Attending: Student in an Organized Health Care Education/Training Program | Admitting: Student in an Organized Health Care Education/Training Program

## 2020-02-20 ENCOUNTER — Emergency Department: Payer: Medicaid Other

## 2020-02-20 DIAGNOSIS — I10 Essential (primary) hypertension: Secondary | ICD-10-CM

## 2020-02-20 DIAGNOSIS — J441 Chronic obstructive pulmonary disease with (acute) exacerbation: Secondary | ICD-10-CM

## 2020-02-20 LAB — CBC WITH DIFFERENTIAL/PLATELET
Abs Immature Granulocytes: 0.04 10*3/uL (ref 0.00–0.07)
Basophils Absolute: 0 10*3/uL (ref 0.0–0.1)
Basophils Relative: 0 %
Eosinophils Absolute: 0.1 10*3/uL (ref 0.0–0.5)
Eosinophils Relative: 1 %
HCT: 43.8 % (ref 36.0–46.0)
Hemoglobin: 14 g/dL (ref 12.0–15.0)
Immature Granulocytes: 0 %
Lymphocytes Relative: 18 %
Lymphs Abs: 1.9 10*3/uL (ref 0.7–4.0)
MCH: 29.9 pg (ref 26.0–34.0)
MCHC: 32 g/dL (ref 30.0–36.0)
MCV: 93.6 fL (ref 80.0–100.0)
Monocytes Absolute: 0.9 10*3/uL (ref 0.1–1.0)
Monocytes Relative: 8 %
Neutro Abs: 7.8 10*3/uL — ABNORMAL HIGH (ref 1.7–7.7)
Neutrophils Relative %: 73 %
Platelets: UNDETERMINED 10*3/uL (ref 150–400)
RBC: 4.68 MIL/uL (ref 3.87–5.11)
RDW: 17.9 % — ABNORMAL HIGH (ref 11.5–15.5)
WBC: 10.8 10*3/uL — ABNORMAL HIGH (ref 4.0–10.5)
nRBC: 0 % (ref 0.0–0.2)

## 2020-02-20 LAB — COMPREHENSIVE METABOLIC PANEL
ALT: 12 U/L (ref 0–44)
AST: 23 U/L (ref 15–41)
Albumin: 3.6 g/dL (ref 3.5–5.0)
Alkaline Phosphatase: 70 U/L (ref 38–126)
Anion gap: 14 (ref 5–15)
BUN: 13 mg/dL (ref 6–20)
CO2: 25 mmol/L (ref 22–32)
Calcium: 8.8 mg/dL — ABNORMAL LOW (ref 8.9–10.3)
Chloride: 103 mmol/L (ref 98–111)
Creatinine, Ser: 0.64 mg/dL (ref 0.44–1.00)
GFR, Estimated: >60 mL/min (ref >60)
Glucose, Bld: 148 mg/dL — ABNORMAL HIGH (ref 70–99)
Potassium: 3.3 mmol/L — ABNORMAL LOW (ref 3.5–5.1)
Sodium: 142 mmol/L (ref 135–145)
Total Bilirubin: 0.9 mg/dL (ref 0.3–1.2)
Total Protein: 7.5 g/dL (ref 6.5–8.1)

## 2020-02-20 LAB — TROPONIN I (HIGH SENSITIVITY)
Troponin I (High Sensitivity): 10 ng/L (ref <18)
Troponin I (High Sensitivity): 12 ng/L (ref <18)

## 2020-02-20 MED ORDER — IOHEXOL 350 MG/ML SOLN
75.0000 mL | Freq: Once | INTRAVENOUS | Status: AC | PRN
  Administered 2020-02-20: 75 mL via INTRAVENOUS
  Filled 2020-02-20: qty 75

## 2020-02-20 MED ORDER — PREDNISONE 20 MG PO TABS
60.0000 mg | ORAL_TABLET | Freq: Once | ORAL | Status: AC
  Administered 2020-02-20: 60 mg via ORAL
  Filled 2020-02-20: qty 3

## 2020-02-20 MED ORDER — CLONIDINE HCL 0.1 MG PO TABS
0.2000 mg | ORAL_TABLET | Freq: Once | ORAL | Status: AC
  Administered 2020-02-20: 0.2 mg via ORAL
  Filled 2020-02-20: qty 2

## 2020-02-20 MED ORDER — PREDNISONE 10 MG PO TABS: ORAL_TABLET | ORAL | 0 refills | Status: DC

## 2020-02-20 MED ORDER — ALBUTEROL SULFATE HFA 108 (90 BASE) MCG/ACT IN AERS
2.0000 | INHALATION_SPRAY | Freq: Once | RESPIRATORY_TRACT | Status: AC
  Administered 2020-02-20: 2 via RESPIRATORY_TRACT
  Filled 2020-02-20: qty 6.7

## 2020-02-20 NOTE — ED Provider Notes (Signed)
[facility] Emergency Department Provider Note    Event Date/Time   First MD Initiated Contact with Patient 02/20/20 [phone]     (approximate)  I have reviewed the triage vital signs and the nursing notes.   HISTORY  Chief Complaint Shortness of Breath    HPI [name] is a 55 y.o. female the below listed past medical history presents to the ER for evaluation of chest discomfort shortness of breath when she woke up this morning.  Had an episode of hemoptysis.  No recent fevers.  Does endorse some orthopnea.  States that she is been compliant with her medications.  Chest pain has been persistent for the past several hours.  Does have remote history of PE not on any anticoagulation at this time.    Past Medical History:  Diagnosis Date  . A-fib ([place]) 2010  . Adrenal adenoma, left   . Alcohol abuse   . Allergy   . CHF (congestive heart failure) ([place])   . COPD (chronic obstructive pulmonary disease) ([place])   . Fatty liver   . GERD (gastroesophageal reflux disease)   . Gout   . Hypertension   . Mild cognitive impairment   . Non-healing non-surgical wound    right leg  . Obesity   . Pulmonary embolus ([place])   . Sleep apnea    uses Cpap  . Stroke ([place])   . Thyroid disease    Family History  Problem Relation Age of Onset  . Prostate cancer Father   . Rectal cancer Sister   . Breast cancer Sister   . Cancer Maternal Grandmother   . Cancer Maternal Grandfather    Past Surgical History:  Procedure Laterality Date  . bilateral wrist fractures  2010  . LEFT HEART CATH AND CORONARY ANGIOGRAPHY N/A 03/27/2018   Procedure: LEFT HEART CATH AND CORONARY ANGIOGRAPHY;  Surgeon: Isaias Cowman, MD;  Location: [place] [facility];  Service: Cardiovascular;  Laterality: N/A;  . lt ankle fracture  2002   Patient Active Problem List   Diagnosis Date Noted  . Need for hepatitis B screening test 11/15/2019  . Hearing loss due to cerumen impaction,  left 07/23/2019  . Lymphedema 12/06/2018  . IFG (impaired fasting glucose) 11/03/2018  . Hallucinations, visual 07/03/2018  . Unstable angina ([place]) 03/25/2018  . Gout 05/09/2017  . COPD (chronic obstructive pulmonary disease) ([place]) 04/10/2017  . Sleep apnea 04/10/2017  . History of pulmonary embolism 04/10/2017  . History of thyroid disease 04/10/2017  . Hyperlipidemia 12/31/2016  . Essential hypertension 05/26/2016  . Chronic diastolic heart failure ([employer]) 05/26/2016  . Hypokalemia 05/26/2016  . A-fib ([place]) 01/26/2008      Prior to Admission medications   Medication Sig Start Date End Date Taking? Authorizing Provider  predniSONE (DELTASONE) 10 MG tablet Day 1-2: Take 50mg (5 pills) Day 3-4: Take 40mg (4) Day 5-6: 30mg (3) Day 7-8: 20mg (2) Day 9:10mg (1 02/20/20  Yes Merlyn Lot, MD  allopurinol (ZYLOPRIM) 300 MG tablet Take 1 tablet (300 mg total) by mouth daily. 07/02/19   Volney American, PA-C  amLODipine (NORVASC) 10 MG tablet Take 1 tablet (10 mg total) by mouth daily. 07/02/19   Volney American, PA-C  aspirin EC 81 MG tablet Take 81 mg by mouth daily.    [provider]  atorvastatin (LIPITOR) 40 MG tablet Take 40 mg by mouth daily. 09/21/19   [provider]  cloNIDine (CATAPRES) 0.1 MG tablet Take 1 tablet (0.1 mg total) by mouth  2 (two) times daily. 07/02/19   Volney American, PA-C  colchicine 0.6 MG tablet TAKE ONE (1) TABLET BY MOUTH TWICE DAILY AS NEEDED FOR GOUT FLARES 12/28/19   Cannady, Henrine Screws T, NP  furosemide (LASIX) 40 MG tablet Take 1 tablet (40 mg total) by mouth daily. 07/02/19 11/28/19  Volney American, PA-C  gabapentin (NEURONTIN) 300 MG capsule 300 mg qAM, 600 mg qhs 02/01/19   Gillis Santa, MD  hydrALAZINE (APRESOLINE) 50 MG tablet Take 1 tablet (50 mg total) by mouth 3 (three) times daily. 01/04/20   Johnson, Megan P, DO  HYDROcodone-acetaminophen (NORCO/VICODIN) 5-325 MG tablet Take 1 tablet by mouth 3 (three) times daily as  needed for moderate pain. 07/02/19   Volney American, PA-C  lisinopril (ZESTRIL) 40 MG tablet Take 1 tablet (40 mg total) by mouth daily. 07/02/19   Volney American, PA-C  metoprolol (TOPROL XL) 200 MG 24 hr tablet Take 1 tablet (200 mg total) by mouth daily. 07/02/19   Volney American, PA-C  ondansetron (ZOFRAN ODT) 4 MG disintegrating tablet Take 1 tablet (4 mg total) by mouth every 8 (eight) hours as needed. 04/25/18   Eula Listen, MD  pantoprazole (PROTONIX) 40 MG tablet TAKE 1 TABLET BY MOUTH EVERY DAY 12/21/19   [name], NP  potassium chloride SA (KLOR-CON M20) 20 MEQ tablet Take 1 tablet (20 mEq total) by mouth 3 (three) times daily. 07/05/19   Volney American, PA-C  PROAIR HFA 108 [phone] Base) MCG/ACT inhaler INHALE TWO (2) PUFFS BY MOUTH EVERY 6 HOURS AS NEEDED FOR WHEEZING OR FOR SHORTNESS OF BREATH 12/21/19   [name], NP  sertraline (ZOLOFT) 100 MG tablet Take 1 tablet (100 mg total) by mouth daily. 07/02/19   Volney American, PA-C  spironolactone (ALDACTONE) 25 MG tablet Take 1 tablet (25 mg total) by mouth daily. 11/28/19 02/26/20  Alisa Graff, FNP  tiotropium (SPIRIVA HANDIHALER) 18 MCG inhalation capsule Place 1 capsule (18 mcg total) into inhaler and inhale daily. 07/02/19   Volney American, PA-C    Allergies Morphine and related and Penicillins    Social History Social History   Tobacco Use  . Smoking status: Former Smoker    Types: Cigarettes    Quit date: 01/26/2016    Years since quitting: 4.0  . Smokeless tobacco: Never Used  Vaping Use  . Vaping Use: Never used  Substance Use Topics  . Alcohol use: Yes    Comment: Occassionally  . Drug use: No    Review of Systems Patient denies headaches, rhinorrhea, blurry vision, numbness, shortness of breath, chest pain, edema, cough, abdominal pain, nausea, vomiting, diarrhea, dysuria, fevers, rashes or hallucinations unless otherwise stated above in  HPI. ____________________________________________   PHYSICAL EXAM:  VITAL SIGNS: Vitals:   02/20/20 0614 02/20/20 0914  BP: (!) 195/100 (!) 193/100  Pulse: 87 81  Resp: 18 16  Temp:    SpO2: 98% 97%    Constitutional: Alert and oriented.  Eyes: Conjunctivae are normal.  Head: Atraumatic. Nose: No congestion/rhinnorhea. Mouth/Throat: Mucous membranes are moist.   Neck: No stridor. Painless ROM.  Cardiovascular: Normal rate, regular rhythm. Grossly normal heart sounds.  Good peripheral circulation. Respiratory: Normal respiratory effort.  No retractions. Lungs with coarse wheeze throughout. Gastrointestinal: Soft and nontender. No distention. No abdominal bruits. No CVA tenderness. Genitourinary:  Musculoskeletal: No lower extremity tenderness nor edema.  No joint effusions. Neurologic:  Normal speech and language. No gross focal neurologic deficits are appreciated. No  facial droop Skin:  Skin is warm, dry and intact. No rash noted. Psychiatric: Mood and affect are normal. Speech and behavior are normal.  ____________________________________________   LABS (all labs ordered are listed, but only abnormal results are displayed)  Results for orders placed or performed during the hospital encounter of 02/20/20 (from the past 24 hour(s))  CBC with Differential     Status: Abnormal   Collection Time: 02/19/20 11:48 PM  Result Value Ref Range   WBC 10.8 (H) 4.0 - 10.5 K/uL   RBC 4.68 3.87 - 5.11 MIL/uL   Hemoglobin 14.0 12.0 - 15.0 g/dL   HCT 43.8 36.0 - 46.0 %   MCV 93.6 80.0 - 100.0 fL   MCH 29.9 26.0 - 34.0 pg   MCHC 32.0 30.0 - 36.0 g/dL   RDW 17.9 (H) 11.5 - 15.5 %   Platelets PLATELET CLUMPS NOTED ON SMEAR, UNABLE TO ESTIMATE 150 - 400 K/uL   nRBC 0.0 0.0 - 0.2 %   Neutrophils Relative % 73 %   Neutro Abs 7.8 (H) 1.7 - 7.7 K/uL   Lymphocytes Relative 18 %   Lymphs Abs 1.9 0.7 - 4.0 K/uL   Monocytes Relative 8 %   Monocytes Absolute 0.9 0.1 - 1.0 K/uL   Eosinophils  Relative 1 %   Eosinophils Absolute 0.1 0.0 - 0.5 K/uL   Basophils Relative 0 %   Basophils Absolute 0.0 0.0 - 0.1 K/uL   Immature Granulocytes 0 %   Abs Immature Granulocytes 0.04 0.00 - 0.07 K/uL  Comprehensive metabolic panel     Status: Abnormal   Collection Time: 02/19/20 11:48 PM  Result Value Ref Range   Sodium 142 135 - 145 mmol/L   Potassium 3.3 (L) 3.5 - 5.1 mmol/L   Chloride 103 98 - 111 mmol/L   CO2 25 22 - 32 mmol/L   Glucose, Bld 148 (H) 70 - 99 mg/dL   BUN 13 6 - 20 mg/dL   Creatinine, Ser 0.64 0.44 - 1.00 mg/dL   Calcium 8.8 (L) 8.9 - 10.3 mg/dL   Total Protein 7.5 6.5 - 8.1 g/dL   Albumin 3.6 3.5 - 5.0 g/dL   AST 23 15 - 41 U/L   ALT 12 0 - 44 U/L   Alkaline Phosphatase 70 38 - 126 U/L   Total Bilirubin 0.9 0.3 - 1.2 mg/dL   GFR, Estimated >60 >60 mL/min   Anion gap 14 5 - 15  Troponin I (High Sensitivity)     Status: None   Collection Time: 02/19/20 11:48 PM  Result Value Ref Range   Troponin I (High Sensitivity) 10 <18 ng/L  Troponin I (High Sensitivity)     Status: None   Collection Time: 02/20/20  3:22 AM  Result Value Ref Range   Troponin I (High Sensitivity) 12 <18 ng/L   ____________________________________________  EKG My review and personal interpretation at Time: 23:34   Indication: sob  Rate: 90  Rhythm: sinus Axis: normal Other: normal intervals, no stemi, nonspecific st abn ____________________________________________  RADIOLOGY  I personally reviewed all radiographic images ordered to evaluate for the above acute complaints and reviewed radiology reports and findings.  These findings were personally discussed with the patient.  Please see medical record for radiology report.  ____________________________________________   PROCEDURES  Procedure(s) performed:  Procedures    Critical Care performed: no ____________________________________________   INITIAL IMPRESSION / ASSESSMENT AND PLAN / ED COURSE  Pertinent labs & imaging  results that were available during my  care of the patient were reviewed by me and considered in my medical decision making (see chart for details).   DDX: covid, Asthma, copd, CHF, pna, ptx, malignancy, Pe, anemia   [name] is a 55 y.o. who presents to the ED with presentation as described above.  Patient nontoxic-appearing.  Has multiple comorbidities that could be causing her symptoms.  Have a lower suspicion for ACS she is not describing any significant chest pain or now.  Chest x-ray does not show any significant edema have a lower suspicion for CHF.  She does have wheezing on exam do suspect a component of COPD or bronchitis.  Will test for COVID also she describes an episode of hemoptysis will order CTA as we do not currently have a D-dimer she does have a history of PE in the past.  She not complaining of any abdominal pain at this time.  The patient will be placed on continuous pulse oximetry and telemetry for monitoring.  Laboratory evaluation will be sent to evaluate for the above complaints.     Clinical Course as of 02/20/20 1118  Wed Feb 20, 2020  1108 CT imaging is reassuring no evidence of PE.  Discussed noted aneurysm that will require follow up with patient.  She feels significant improved.  She was able to walk down the hall without any dyspnea.  I'm going to treat her for acute bronchitis.  Her blood pressure is elevated but I don't see any evidence of acute CHF.  She is not having any chest pain or pressure.  No edema on imaging.  She is not hypoxic and patient states that she missed her morning dose of antihypertensives have given her dose here and patient will take her medications when she gets home.  Have discussed with the patient and available family all diagnostics and treatments performed thus far and all questions were answered to the best of my ability. The patient demonstrates understanding and agreement with plan.  [PR]    Clinical Course User Index [PR] Merlyn Lot, MD    The patient was evaluated in Emergency Department today for the symptoms described in the history of present illness. He/she was evaluated in the context of the global COVID-19 pandemic, which necessitated consideration that the patient might be at risk for infection with the SARS-CoV-2 virus that causes COVID-19. Institutional protocols and algorithms that pertain to the evaluation of patients at risk for COVID-19 are in a state of rapid change based on information released by regulatory bodies including the CDC and federal and state organizations. These policies and algorithms were followed during the patient's care in the ED.  As part of my medical decision making, I reviewed the following data within the [facility] notes reviewed and incorporated, Labs reviewed, notes from prior ED visits and [place] Controlled Substance Database   ____________________________________________   FINAL CLINICAL IMPRESSION(S) / ED DIAGNOSES  Final diagnoses:  COPD exacerbation ([place])  Hypertension, unspecified type      NEW MEDICATIONS STARTED DURING THIS VISIT:  New Prescriptions   PREDNISONE (DELTASONE) 10 MG TABLET    Day 1-2: Take 50mg (5 pills) Day 3-4: Take 40mg (4) Day 5-6: 30mg (3) Day 7-8: 20mg (2) Day 9:10mg (1     Note:  This document was prepared using Dragon voice recognition software and may include unintentional dictation errors.    Merlyn Lot, MD 02/20/20 1118

## 2020-02-20 NOTE — ED Notes (Signed)
Patient transported to CT 

## 2020-02-21 ENCOUNTER — Other Ambulatory Visit: Payer: Self-pay | Admitting: Student in an Organized Health Care Education/Training Program

## 2020-02-22 ENCOUNTER — Other Ambulatory Visit: Payer: Self-pay | Admitting: Student in an Organized Health Care Education/Training Program

## 2020-02-26 ENCOUNTER — Other Ambulatory Visit: Payer: Self-pay

## 2020-02-26 ENCOUNTER — Encounter: Payer: Self-pay | Admitting: Family

## 2020-02-26 ENCOUNTER — Ambulatory Visit: Payer: Medicaid Other | Attending: Family | Admitting: Family

## 2020-02-26 VITALS — BP 161/105 | HR 85 | Resp 18 | Ht 69.0 in | Wt 229.4 lb

## 2020-02-26 DIAGNOSIS — Z8673 Personal history of transient ischemic attack (TIA), and cerebral infarction without residual deficits: Secondary | ICD-10-CM | POA: Diagnosis not present

## 2020-02-26 DIAGNOSIS — J449 Chronic obstructive pulmonary disease, unspecified: Secondary | ICD-10-CM | POA: Diagnosis not present

## 2020-02-26 DIAGNOSIS — Z880 Allergy status to penicillin: Secondary | ICD-10-CM | POA: Diagnosis not present

## 2020-02-26 DIAGNOSIS — Z7982 Long term (current) use of aspirin: Secondary | ICD-10-CM | POA: Insufficient documentation

## 2020-02-26 DIAGNOSIS — I10 Essential (primary) hypertension: Secondary | ICD-10-CM

## 2020-02-26 DIAGNOSIS — I4891 Unspecified atrial fibrillation: Secondary | ICD-10-CM | POA: Insufficient documentation

## 2020-02-26 DIAGNOSIS — G4733 Obstructive sleep apnea (adult) (pediatric): Secondary | ICD-10-CM | POA: Insufficient documentation

## 2020-02-26 DIAGNOSIS — I5032 Chronic diastolic (congestive) heart failure: Secondary | ICD-10-CM | POA: Diagnosis not present

## 2020-02-26 DIAGNOSIS — Z86711 Personal history of pulmonary embolism: Secondary | ICD-10-CM | POA: Diagnosis not present

## 2020-02-26 DIAGNOSIS — I110 Hypertensive heart disease with heart failure: Secondary | ICD-10-CM | POA: Diagnosis present

## 2020-02-26 DIAGNOSIS — Z87891 Personal history of nicotine dependence: Secondary | ICD-10-CM | POA: Diagnosis not present

## 2020-02-26 DIAGNOSIS — I890 Lymphedema, not elsewhere classified: Secondary | ICD-10-CM

## 2020-02-26 DIAGNOSIS — M109 Gout, unspecified: Secondary | ICD-10-CM | POA: Insufficient documentation

## 2020-02-26 DIAGNOSIS — Z885 Allergy status to narcotic agent status: Secondary | ICD-10-CM | POA: Insufficient documentation

## 2020-02-26 DIAGNOSIS — Z79899 Other long term (current) drug therapy: Secondary | ICD-10-CM | POA: Diagnosis not present

## 2020-02-26 DIAGNOSIS — Z9981 Dependence on supplemental oxygen: Secondary | ICD-10-CM | POA: Insufficient documentation

## 2020-02-26 NOTE — Patient Instructions (Addendum)
Continue weighing daily and call for an overnight weight gain of > 2 pounds or a weekly weight gain of >5 pounds.   Call your primary care doctor to schedule follow-up appointment and pick up your prednisone.    [facility]  [address], [place], [place] [address] [phone]

## 2020-02-26 NOTE — Progress Notes (Signed)
HPI   [name] is a 55 y/o female with a history of pulmonary embolus, obstructive sleep apnea, atrial fibrillation, COPD, gout, HTN, CVA, remote tobacco use and chronic heart failure.   Echo report from 03/26/2018 reviewed and showed an EF of 60-65% along with mild/ moderate TR. Echo report from 06/17/16 reviewed and showed an EF of 55-65%. Had a pharmacologic stress trest done 09/12/12 and had an estimated EF of 49%.   Catheterization done 03/27/2018 showed normal coronary arteries and normal left ventricular function.  Was in the ED 02/20/20 due to shortness of breath & cough. Chest CT negative for PE. Treated for COPD exacerbation and HTN and was released. Was in the ED 10/13/19 due to UTI where she was treated and released.   She presents today for a follow-up visit with a chief complaint of minimal shortness of breath upon moderate exertion. She describes this as chronic in nature having been present for several years. She does feel like it's a little better since recent ED visit. She has associated chest tightness, fatigue, wheezing and palpitations along with this. She denies any difficulty sleeping, dizziness, abdominal distention, pedal edema, chest pain, cough or weight gain.   Has not picked up prednisone prescription yet but is planning on getting it today. She says that she hasn't taken any of her medications yet today.   Past Medical History:  Diagnosis Date  . A-fib ([place]) 2010  . Adrenal adenoma, left   . Alcohol abuse   . Allergy   . CHF (congestive heart failure) ([place])   . COPD (chronic obstructive pulmonary disease) ([place])   . Fatty liver   . GERD (gastroesophageal reflux disease)   . Gout   . Hypertension   . Mild cognitive impairment   . Non-healing non-surgical wound    right leg  . Obesity   . Pulmonary embolus ([place])   . Sleep apnea    uses Cpap  . Stroke ([place])   . Thyroid disease    Past Surgical History:  Procedure Laterality Date  . bilateral wrist fractures   2010  . LEFT HEART CATH AND CORONARY ANGIOGRAPHY N/A 03/27/2018   Procedure: LEFT HEART CATH AND CORONARY ANGIOGRAPHY;  Surgeon: Isaias Cowman, MD;  Location: [facility];  Service: Cardiovascular;  Laterality: N/A;  . lt ankle fracture  2002   Family History  Problem Relation Age of Onset  . Prostate cancer Father   . Rectal cancer Sister   . Breast cancer Sister   . Cancer Maternal Grandmother   . Cancer Maternal Grandfather    Social History   Tobacco Use  . Smoking status: Former Smoker    Types: Cigarettes    Quit date: 01/26/2016    Years since quitting: 4.0  . Smokeless tobacco: Never Used  Substance Use Topics  . Alcohol use: Yes    Comment: Occassionally   Allergies  Allergen Reactions  . Morphine And Related Hives  . Penicillins Other (See Comments)    Painful urination Has patient had a PCN reaction causing immediate rash, facial/tongue/throat swelling, SOB or lightheadedness with hypotension: yes Has patient had a PCN reaction causing severe rash involving mucus membranes or skin necrosis: no Has patient had a PCN reaction that required hospitalization no Has patient had a PCN reaction occurring within the last 10 years: no If all of the above answers are "NO", then may proceed with Cephalosporin use.    Prior to Admission medications   Medication Sig Start Date  End Date Taking? Authorizing Provider  allopurinol (ZYLOPRIM) 300 MG tablet Take 1 tablet (300 mg total) by mouth daily. 07/02/19  Yes Volney American, PA-C  amLODipine (NORVASC) 10 MG tablet Take 1 tablet (10 mg total) by mouth daily. 07/02/19  Yes Volney American, PA-C  aspirin EC 81 MG tablet Take 81 mg by mouth daily.   Yes [provider]  atorvastatin (LIPITOR) 40 MG tablet Take 40 mg by mouth daily. 09/21/19  Yes [provider]  cloNIDine (CATAPRES) 0.1 MG tablet Take 1 tablet (0.1 mg total) by mouth 2 (two) times daily. 07/02/19  Yes Volney American,  PA-C  colchicine 0.6 MG tablet TAKE ONE (1) TABLET BY MOUTH TWICE DAILY AS NEEDED FOR GOUT FLARES 12/28/19  Yes Cannady, Jolene T, NP  furosemide (LASIX) 40 MG tablet Take 1 tablet (40 mg total) by mouth daily. 07/02/19 11/28/19 Yes Volney American, PA-C  gabapentin (NEURONTIN) 300 MG capsule 300 mg qAM, 600 mg qhs 02/01/19  Yes Gillis Santa, MD  hydrALAZINE (APRESOLINE) 50 MG tablet Take 1 tablet (50 mg total) by mouth 3 (three) times daily. 01/04/20  Yes Johnson, Megan P, DO  HYDROcodone-acetaminophen (NORCO/VICODIN) 5-325 MG tablet Take 1 tablet by mouth 3 (three) times daily as needed for moderate pain. 07/02/19  Yes Volney American, PA-C  lisinopril (ZESTRIL) 40 MG tablet Take 1 tablet (40 mg total) by mouth daily. 07/02/19  Yes Volney American, PA-C  metoprolol (TOPROL XL) 200 MG 24 hr tablet Take 1 tablet (200 mg total) by mouth daily. 07/02/19  Yes Volney American, PA-C  ondansetron (ZOFRAN ODT) 4 MG disintegrating tablet Take 1 tablet (4 mg total) by mouth every 8 (eight) hours as needed. 04/25/18  Yes Eula Listen, MD  pantoprazole (PROTONIX) 40 MG tablet TAKE 1 TABLET BY MOUTH EVERY DAY 12/21/19  Yes [name], NP  potassium chloride SA (KLOR-CON M20) 20 MEQ tablet Take 1 tablet (20 mEq total) by mouth 3 (three) times daily. 07/05/19  Yes Volney American, PA-C  PROAIR HFA 108 [phone] Base) MCG/ACT inhaler INHALE TWO (2) PUFFS BY MOUTH EVERY 6 HOURS AS NEEDED FOR WHEEZING OR FOR SHORTNESS OF BREATH 12/21/19  Yes [name], NP  sertraline (ZOLOFT) 100 MG tablet Take 1 tablet (100 mg total) by mouth daily. 07/02/19  Yes Volney American, PA-C  spironolactone (ALDACTONE) 25 MG tablet Take 1 tablet (25 mg total) by mouth daily. 11/28/19 02/26/20 Yes Janasha Barkalow, Otila Kluver A, FNP  tiotropium (SPIRIVA HANDIHALER) 18 MCG inhalation capsule Place 1 capsule (18 mcg total) into inhaler and inhale daily. 07/02/19  Yes Volney American, PA-C  predniSONE (DELTASONE) 10 MG  tablet Day 1-2: Take 50mg (5 pills) Day 3-4: Take 40mg (4) Day 5-6: 30mg (3) Day 7-8: 20mg (2) Day 9:10mg (1 Patient not taking: Reported on 02/26/2020 02/20/20   Merlyn Lot, MD    Review of Systems  Constitutional: Positive for fatigue (with moderate exertion). Negative for appetite change.  HENT: Negative for congestion and postnasal drip.   Eyes: Positive for visual disturbance (blurry vision).  Respiratory: Positive for chest tightness, shortness of breath and wheezing. Negative for cough.   Cardiovascular: Positive for palpitations. Negative for chest pain and leg swelling.  Gastrointestinal: Negative for abdominal distention and abdominal pain.  Endocrine: Negative.   Genitourinary: Negative.   Musculoskeletal: Negative for arthralgias and back pain.  Skin: Negative.   Allergic/Immunologic: Negative.   Neurological: Negative for dizziness and light-headedness.  Hematological: Negative for adenopathy. Does not bruise/bleed easily.  Psychiatric/Behavioral: Negative for dysphoric mood and sleep disturbance (wearing CPAP nightly; sleeping on 2 pillows). The patient is not nervous/anxious.    Vitals:   02/26/20 1005  BP: (!) 161/105  Pulse: 85  Resp: 18  SpO2: 95%  Weight: 229 lb 6 oz (104 kg)  Height: 5\' 9"  (1.753 m)   Wt Readings from Last 3 Encounters:  02/26/20 229 lb 6 oz (104 kg)  02/19/20 224 lb (101.6 kg)  11/28/19 228 lb 6 oz (103.6 kg)   Lab Results  Component Value Date   CREATININE 0.64 02/19/2020   CREATININE 0.60 10/13/2019   CREATININE 0.73 07/02/2019     Physical Exam Vitals and nursing note reviewed.  Constitutional:      Appearance: She is well-developed.  HENT:     Head: Normocephalic and atraumatic.  Neck:     Vascular: No JVD.  Cardiovascular:     Rate and Rhythm: Normal rate and regular rhythm.  Pulmonary:     Effort: Pulmonary effort is normal. No respiratory distress.     Breath sounds: No wheezing or rales.  Abdominal:     General:  There is no distension.     Palpations: Abdomen is soft.     Tenderness: There is no abdominal tenderness.  Musculoskeletal:        General: No tenderness.     Cervical back: Normal range of motion and neck supple.     Right lower leg: No tenderness. No edema.     Left lower leg: No tenderness. No edema.  Skin:    General: Skin is warm and dry.  Neurological:     Mental Status: She is alert and oriented to person, place, and time.  Psychiatric:        Behavior: Behavior normal.        Thought Content: Thought content normal.    Assessment & Plan:  1: Chronic heart failure with preserved ejection fraction- - NYHA class II - euvolemic today - weighing daily; reminded to call for an overnight weight gain of >2 pounds or a weekly weight gain of >5 pounds.  - weight up 1 pound from last visit here 3 months ago - not adding salt to her food. Reminded to closely follow a 2000mg  sodium diet  - saw cardiology Mellissa Kohut) 06/18/16; f/u appt scheduled for tomorrow  - wearing oxygen at 2L during the day and at bedtime; patient on room air today with sat of 95% - did not show for echo scheduled for 12/05/19; will r/s at next visit if not done with cardiology group - BNP 10/31/2018 was 59.0 - received both covid vaccines - has not gotten her flu vaccine for this season   2: HTN- - BP elevated but she hasn't taken her medications yet today; says that she will take them as soon as she gets home - instructed to take her medications 1-2 hours before any medical appointment so the effectiveness of the medication can be assessed - saw PCP Jamesetta Orleans) 11/15/19 - reviewed BMP done 02/19/20 and it showed sodium 142, potassium 3.3, creatinine 0.64 and GFR >60  3: Obstructive sleep apnea- - wearing CPAP nightly - reports sleeping well  4: Lymphedema- - resolved   Patient did not bring her medications nor a list. Each medication was verbally reviewed with the patient and she was encouraged to bring the  bottles to every visit to confirm accuracy of list.  Return in 3 months or sooner for any questions/problems before then.

## 2020-03-03 DIAGNOSIS — I7121 Aneurysm of the ascending aorta, without rupture: Secondary | ICD-10-CM | POA: Insufficient documentation

## 2020-03-04 ENCOUNTER — Other Ambulatory Visit: Payer: Self-pay

## 2020-03-04 NOTE — Telephone Encounter (Signed)
Patient last seen 10/21.

## 2020-03-17 ENCOUNTER — Ambulatory Visit: Payer: Medicaid Other | Admitting: Internal Medicine

## 2020-03-17 NOTE — Progress Notes (Signed)
Pt canceled her appointment. Office staff will reach out to reschedule.

## 2020-04-14 ENCOUNTER — Ambulatory Visit: Payer: Medicaid Other | Admitting: Internal Medicine

## 2020-04-14 NOTE — Progress Notes (Unsigned)
Pt did not show for scheduled appointment. Office staff will reach out to reschedule.  

## 2020-04-18 NOTE — Progress Notes (Signed)
[facility] [place], [place] [address]  Pulmonary Sleep Medicine   Office Visit Note  Patient Name: [name] DOB: [birth year] MRN [id number]    Chief Complaint: Obstructive Sleep Apnea visit  Brief History:  [name] is seen today for follow oup The patient has a 2 year history of sleep apnea. Patient is using PAP nightly.  The patient feels more after sleeping with PAP.  The patient reports benefiting from PAP use. Reported sleepiness is  improved and the Epworth Sleepiness Score is 4 out of 24. The patient does not take naps. The patient complains of the following: no complaints except for all the supplies are old  The compliance download shows excellent compliance with an average use time of 10.2 hours. The AHI is 2.2  The patient reports control of her RLS with gabapentin 300 mg  ROS  General: (-) fever, (-) chills, (-) night sweat Nose and Sinuses: (-) nasal stuffiness or itchiness, (-) postnasal drip, (-) nosebleeds, (-) sinus trouble. Mouth and Throat: (-) sore throat, (-) hoarseness. Neck: (-) swollen glands, (-) enlarged thyroid, (-) neck pain. Respiratory: - cough, - shortness of breath, - wheezing. Neurologic: - numbness, - tingling. Psychiatric: she has anxiety and depression, but these are controlled.    Current Medication: Outpatient Encounter Medications as of 04/21/2020  Medication Sig  . allopurinol (ZYLOPRIM) 300 MG tablet Take 1 tablet (300 mg total) by mouth daily.  Marland Kitchen amLODipine (NORVASC) 10 MG tablet Take 1 tablet (10 mg total) by mouth daily.  Marland Kitchen aspirin EC 81 MG tablet Take 81 mg by mouth daily.  Marland Kitchen atorvastatin (LIPITOR) 40 MG tablet Take 40 mg by mouth daily.  . cloNIDine (CATAPRES) 0.1 MG tablet Take 1 tablet (0.1 mg total) by mouth 2 (two) times daily.  . colchicine 0.6 MG tablet TAKE ONE (1) TABLET BY MOUTH TWICE DAILY AS NEEDED FOR GOUT FLARES  . furosemide (LASIX) 40 MG tablet Take 1 tablet (40 mg total) by mouth daily.   Marland Kitchen gabapentin (NEURONTIN) 300 MG capsule 300 mg qAM, 600 mg qhs  . hydrALAZINE (APRESOLINE) 50 MG tablet Take 1 tablet (50 mg total) by mouth 3 (three) times daily.  Marland Kitchen HYDROcodone-acetaminophen (NORCO/VICODIN) 5-325 MG tablet Take 1 tablet by mouth 3 (three) times daily as needed for moderate pain.  Marland Kitchen lisinopril (ZESTRIL) 40 MG tablet Take 1 tablet (40 mg total) by mouth daily.  . metoprolol (TOPROL XL) 200 MG 24 hr tablet Take 1 tablet (200 mg total) by mouth daily.  . ondansetron (ZOFRAN ODT) 4 MG disintegrating tablet Take 1 tablet (4 mg total) by mouth every 8 (eight) hours as needed.  . pantoprazole (PROTONIX) 40 MG tablet TAKE 1 TABLET BY MOUTH EVERY DAY  . potassium chloride SA (KLOR-CON M20) 20 MEQ tablet Take 1 tablet (20 mEq total) by mouth 3 (three) times daily.  . predniSONE (DELTASONE) 10 MG tablet Day 1-2: Take 50mg (5 pills) Day 3-4: Take 40mg (4) Day 5-6: 30mg (3) Day 7-8: 20mg (2) Day 9:10mg (1 (Patient not taking: Reported on 02/26/2020)  . PROAIR HFA 108 (90 Base) MCG/ACT inhaler INHALE TWO (2) PUFFS BY MOUTH EVERY 6 HOURS AS NEEDED FOR WHEEZING OR FOR SHORTNESS OF BREATH  . sertraline (ZOLOFT) 100 MG tablet Take 1 tablet (100 mg total) by mouth daily.  Marland Kitchen spironolactone (ALDACTONE) 25 MG tablet Take 1 tablet (25 mg total) by mouth daily.  Marland Kitchen tiotropium (SPIRIVA HANDIHALER) 18 MCG inhalation capsule Place 1 capsule (18 mcg total) into inhaler and inhale daily.  No facility-administered encounter medications on file as of 04/21/2020.    Surgical History: Past Surgical History:  Procedure Laterality Date  . bilateral wrist fractures  2010  . LEFT HEART CATH AND CORONARY ANGIOGRAPHY N/A 03/27/2018   Procedure: LEFT HEART CATH AND CORONARY ANGIOGRAPHY;  Surgeon: Isaias Cowman, MD;  Location: [place] [facility];  Service: Cardiovascular;  Laterality: N/A;  . lt ankle fracture  2002    Medical History: Past Medical History:  Diagnosis Date  . A-fib ([place]) 2010  . Adrenal  adenoma, left   . Alcohol abuse   . Allergy   . CHF (congestive heart failure) ([place])   . COPD (chronic obstructive pulmonary disease) ([place])   . Fatty liver   . GERD (gastroesophageal reflux disease)   . Gout   . Hypertension   . Mild cognitive impairment   . Non-healing non-surgical wound    right leg  . Obesity   . Pulmonary embolus ([place])   . Sleep apnea    uses Cpap  . Stroke ([place])   . Thyroid disease     Family History: Non contributory to the present illness  Social History: Social History   Socioeconomic History  . Marital status: Legally Separated    Spouse name: Not on file  . Number of children: Not on file  . Years of education: Not on file  . Highest education level: Not on file  Occupational History  . Not on file  Tobacco Use  . Smoking status: Former Smoker    Types: Cigarettes    Quit date: 01/26/2016    Years since quitting: 4.2  . Smokeless tobacco: Never Used  Vaping Use  . Vaping Use: Never used  Substance and Sexual Activity  . Alcohol use: Yes    Comment: Occassionally  . Drug use: No  . Sexual activity: Never  Other Topics Concern  . Not on file  Social History Narrative  . Not on file   Social Determinants of Health   Financial Resource Strain: Not on file  Food Insecurity: Not on file  Transportation Needs: Not on file  Physical Activity: Not on file  Stress: Not on file  Social Connections: Not on file  Intimate Partner Violence: Not on file    Vital Signs: Blood pressure (!) 180/104, pulse 96, temperature 98.1 F (36.7 C), temperature source Temporal, resp. rate 16, height 5\' 9"  (1.753 m), weight 224 lb (101.6 kg), SpO2 92 %.  Examination: General Appearance: The patient is well-developed, well-nourished, and in no distress. Neck Circumference: 44 Skin: Gross inspection of skin unremarkable. Head: normocephalic, no gross deformities. Eyes: no gross deformities noted. ENT: ears appear grossly normal Neurologic: Alert and  oriented. No involuntary movements.    EPWORTH SLEEPINESS SCALE:  Scale:  (0)= no chance of dozing; (1)= slight chance of dozing; (2)= moderate chance of dozing; (3)= high chance of dozing  Chance  Situtation    Sitting and reading: 1    Watching TV: 1    Sitting Inactive in public: 0    As a passenger in car: 0      Lying down to rest: 2    Sitting and talking: 0    Sitting quielty after lunch: 0    In a car, stopped in traffic: 0   TOTAL SCORE:   4 out of 24    SLEEP STUDIES:  1. 06/30/17 Split - AHI 112.3,  Low SpO2 67%,  15cmh2o    CPAP COMPLIANCE DATA:  Date  Range: 03/21/20 - 04/19/20  Average Daily Use: 10:09 hours  Median Use: 9:52 hrs  Compliance for > 4 Hours: 100%  AHI: 2.2 respiratory events per hour  Days Used: 30/30 days  Mask Leak: 74.0 lpm  95th Percentile Pressure: 15.0 cmH2O         LABS: Recent Results (from the past 2160 hour(s))  CBC with Differential     Status: Abnormal   Collection Time: 02/19/20 11:48 PM  Result Value Ref Range   WBC 10.8 (H) 4.0 - 10.5 K/uL   RBC 4.68 3.87 - 5.11 MIL/uL   Hemoglobin 14.0 12.0 - 15.0 g/dL   HCT 43.8 36.0 - 46.0 %   MCV 93.6 80.0 - 100.0 fL   MCH 29.9 26.0 - 34.0 pg   MCHC 32.0 30.0 - 36.0 g/dL   RDW 17.9 (H) 11.5 - 15.5 %   Platelets PLATELET CLUMPS NOTED ON SMEAR, UNABLE TO ESTIMATE 150 - 400 K/uL    Comment: Immature Platelet Fraction may be clinically indicated, consider ordering this additional test [id number]    nRBC 0.0 0.0 - 0.2 %   Neutrophils Relative % 73 %   Neutro Abs 7.8 (H) 1.7 - 7.7 K/uL   Lymphocytes Relative 18 %   Lymphs Abs 1.9 0.7 - 4.0 K/uL   Monocytes Relative 8 %   Monocytes Absolute 0.9 0.1 - 1.0 K/uL   Eosinophils Relative 1 %   Eosinophils Absolute 0.1 0.0 - 0.5 K/uL   Basophils Relative 0 %   Basophils Absolute 0.0 0.0 - 0.1 K/uL   Immature Granulocytes 0 %   Abs Immature Granulocytes 0.04 0.00 - 0.07 K/uL    Comment: Performed at [facility], [address] [place], [place] [address]  Comprehensive metabolic panel     Status: Abnormal   Collection Time: 02/19/20 11:48 PM  Result Value Ref Range   Sodium 142 135 - 145 mmol/L   Potassium 3.3 (L) 3.5 - 5.1 mmol/L    Comment: HEMOLYSIS AT THIS LEVEL MAY AFFECT RESULT   Chloride 103 98 - 111 mmol/L   CO2 25 22 - 32 mmol/L   Glucose, Bld 148 (H) 70 - 99 mg/dL    Comment: Glucose reference range applies only to samples taken after fasting for at least 8 hours.   BUN 13 6 - 20 mg/dL   Creatinine, Ser 0.64 0.44 - 1.00 mg/dL   Calcium 8.8 (L) 8.9 - 10.3 mg/dL   Total Protein 7.5 6.5 - 8.1 g/dL   Albumin 3.6 3.5 - 5.0 g/dL   AST 23 15 - 41 U/L   ALT 12 0 - 44 U/L   Alkaline Phosphatase 70 38 - 126 U/L   Total Bilirubin 0.9 0.3 - 1.2 mg/dL   GFR, Estimated >60 >60 mL/min    Comment: (NOTE) Calculated using the CKD-EPI Creatinine Equation (2021)    Anion gap 14 5 - 15    Comment: Performed at [facility], [place], [place] [address]  Troponin I (High Sensitivity)     Status: None   Collection Time: 02/19/20 11:48 PM  Result Value Ref Range   Troponin I (High Sensitivity) 10 <18 ng/L    Comment: (NOTE) Elevated high sensitivity troponin I (hsTnI) values and significant  changes across serial measurements may suggest ACS but many other  chronic and acute conditions are known to elevate hsTnI results.  Refer to the "Links" section for chest pain algorithms and additional  guidance. Performed at  [place], [place] [address]   Troponin I (High Sensitivity)     Status: None   Collection Time: 02/20/20  3:22 AM  Result Value Ref Range   Troponin I (High Sensitivity) 12 <18 ng/L    Comment: (NOTE) Elevated high sensitivity troponin I (hsTnI) values and significant  changes across serial measurements may suggest ACS but many other  chronic and acute conditions are known to elevate hsTnI results.   Refer to the "Links" section for chest pain algorithms and additional  guidance. Performed at [facility], [address], [place], [place] [address]     Radiology: DG Chest 2 View  Result Date: 02/20/2020 CLINICAL DATA:  Dyspnea EXAM: CHEST - 2 VIEW COMPARISON:  10/13/2019, CT 03/25/2018 FINDINGS: Lungs are clear. No pneumothorax or pleural effusion. Cardiac size within normal limits. The thoracic aorta is tortuous and ectatic, unchanged from prior examination. Numerous healed bilateral rib fractures are again identified. No acute bone abnormality. IMPRESSION: No active cardiopulmonary disease. Electronically Signed   By: Fidela Salisbury MD   On: 02/20/2020 00:02   CT Angio Chest PE W and/or Wo Contrast  Result Date: 02/20/2020 CLINICAL DATA:  Cough and shortness of breath EXAM: CT ANGIOGRAPHY CHEST WITH CONTRAST TECHNIQUE: Multidetector CT imaging of the chest was performed using the standard protocol during bolus administration of intravenous contrast. Multiplanar CT image reconstructions and MIPs were obtained to evaluate the vascular anatomy. CONTRAST:  23mL OMNIPAQUE IOHEXOL 350 MG/ML SOLN COMPARISON:  Chest radiograph February 19, 2020; CT angiogram chest March 25, 2018 FINDINGS: Cardiovascular: There is no demonstrable pulmonary embolus. There is prominence of the ascending thoracic aorta with a measured diameter of 4.6 x 4.6 cm. No evident dissection. There is mild calcification in the proximal left subclavian artery. There is also slight calcification right innominate artery. There are foci of aortic atherosclerosis. There is no pericardial effusion or pericardial thickening. There is tortuosity in the distal descending thoracic aorta. Mediastinum/Nodes: Visualized thyroid appears unremarkable. No evident thoracic adenopathy. There is a small hiatal hernia. Lungs/Pleura: There is scarring in the lung base regions. There are areas of pleural thickening on the left at the sites of  prior rib trauma. There is no edema or airspace opacity. No appreciable pleural effusions. No pneumothorax. Trachea and major bronchial structures appear patent. Upper Abdomen: Stable left adrenal mass, most likely an adenoma. This apparent adenoma measures 2.7 x 1.9 cm. There is evident hepatic steatosis. Musculoskeletal: Stable compression fractures in the upper and midthoracic region noted. Multiple healed rib fractures noted on the left. Prior fracture of the medial left clavicle, unchanged. There also several healed rib fractures on the right with less displacement than on the left side. Review of the MIP images confirms the above findings. IMPRESSION: 1.  No demonstrable pulmonary embolus. 2. Prominence of the ascending thoracic aorta measuring 4.6 x 4.6 cm. No evident dissection. Ascending thoracic aortic aneurysm. Recommend semi-annual imaging followup by CTA or MRA and referral to cardiothoracic surgery if not already obtained. This recommendation follows 2010 ACCF/AHA/AATS/ACR/ASA/SCA/SCAI/SIR/STS/SVM Guidelines for the Diagnosis and Management of Patients With Thoracic Aortic Disease. Circulation. 2010; 121: V956-L875. Aortic aneurysm NOS (ICD10-I71.9). There is aortic atherosclerosis as well as scattered foci of great vessel calcification. 3. Scarring in the lung bases. No edema or airspace opacity. Pleural thickening on the left at sites of previous rib fractures, stable. 4.  No adenopathy. 5.  Small hiatal hernia. 6.  Stable left adrenal adenoma. 7.  Hepatic steatosis.  8.  Extensive bony trauma in the thorax, stable. Aortic Atherosclerosis (ICD10-I70.0). Electronically Signed   By: Lowella Grip III M.D.   On: 02/20/2020 10:48    No results found.  No results found.    Assessment and Plan: Patient Active Problem List   Diagnosis Date Noted  . OSA on CPAP 04/21/2020  . CPAP use counseling 04/21/2020  . Need for hepatitis B screening test 11/15/2019  . Hearing loss due to cerumen  impaction, left 07/23/2019  . Lymphedema 12/06/2018  . IFG (impaired fasting glucose) 11/03/2018  . Hallucinations, visual 07/03/2018  . Unstable angina ([place]) 03/25/2018  . Gout 05/09/2017  . COPD (chronic obstructive pulmonary disease) ([place]) 04/10/2017  . Sleep apnea 04/10/2017  . History of pulmonary embolism 04/10/2017  . History of thyroid disease 04/10/2017  . Hyperlipidemia 12/31/2016  . Essential hypertension 05/26/2016  . Chronic diastolic heart failure ([place]) 05/26/2016  . Hypokalemia 05/26/2016  . COPD (chronic obstructive pulmonary disease) ([place]) 09/11/2013  . Essential hypertension 10/27/2009  . Esophageal reflux 12/23/2008    1. OSA on CPAP The patient does tolerate PAP and reports significant benefit from PAP use. The patient' mask if falling apart and there is black mold in her machine. She was reminded how to clean the machine and advised to get all new supplies. The patient was also counselled continue to exercise and watch her diet. The compliance has been excellent and the apnea is controlled. Mask leak is old and leak is elevated. OSA- continue excellent compliance    2. CPAP use counseling CPAP Counseling: had a lengthy discussion with the patient regarding the importance of PAP therapy in management of the sleep apnea. Patient appears to understand the risk factor reduction and also understands the risks associated with untreated sleep apnea. Patient will try to make a good faith effort to remain compliant with therapy. Also instructed the patient on proper cleaning of the device including the water must be changed daily if possible and use of distilled water is preferred. Patient understands that the machine should be regularly cleaned with appropriate recommended cleaning solutions that do not damage the PAP machine for example given white vinegar and water rinses. Other methods such as ozone treatment may not be as good as these simple methods to achieve  cleaning.  3. Chronic diastolic heart failure ([place]) Compensated, but she has been off her diuretic and her blood pressure is not controlled. She will call her doctor today to discuss her numbers and addressing this issue.   4. Essential hypertension Elevated today. Asymptomatic. Per pt not well controlled recently. She will call her provider today. Hypertension Counseling:   The following hypertensive lifestyle modification were recommended and discussed:  1. Limiting alcohol intake to less than 1 oz/day of ethanol:(24 oz of beer or 8 oz of wine or 2 oz of 100-proof whiskey). 2. Take baby ASA 81 mg daily. 3. Importance of regular aerobic exercise and losing weight. 4. Reduce dietary saturated fat and cholesterol intake for overall cardiovascular health. 5. Maintaining adequate dietary potassium, calcium, and magnesium intake. 6. Regular monitoring of the blood pressure. 7. Reduce sodium intake to less than 100 mmol/day (less than 2.3 gm of sodium or less than 6 gm of sodium choride)   General Counseling: I have discussed the findings of the evaluation and examination with [name].  I have also discussed any further diagnostic evaluation thatmay be needed or ordered today. Shelia verbalizes understanding of the findings of todays visit. We also reviewed her  medications today and discussed drug interactions and side effects including but not limited excessive drowsiness and altered mental states. We also discussed that there is always a risk not just to her but also people around her. she has been encouraged to call the office with any questions or concerns that should arise related to todays visit.  No orders of the defined types were placed in this encounter.       I have personally obtained a history, examined the patient, evaluated laboratory and imaging results, formulated the assessment and plan and placed orders.  This patient was seen today by Tressie Ellis, PA-C in collaboration  with Dr. Devona Konig.   Richelle Ito Saunders Glance, PhD, FAASM  Diplomate, American Board of Sleep Medicine    Allyne Gee, MD [facility] Diplomate ABMS Pulmonary and Critical Care Medicine Sleep medicine

## 2020-04-21 ENCOUNTER — Ambulatory Visit (INDEPENDENT_AMBULATORY_CARE_PROVIDER_SITE_OTHER): Payer: Medicaid Other | Admitting: Internal Medicine

## 2020-04-21 VITALS — BP 180/104 | HR 96 | Temp 98.1°F | Resp 16 | Ht 69.0 in | Wt 224.0 lb

## 2020-04-21 DIAGNOSIS — I5032 Chronic diastolic (congestive) heart failure: Secondary | ICD-10-CM

## 2020-04-21 DIAGNOSIS — I10 Essential (primary) hypertension: Secondary | ICD-10-CM

## 2020-04-21 DIAGNOSIS — G4733 Obstructive sleep apnea (adult) (pediatric): Secondary | ICD-10-CM

## 2020-04-21 DIAGNOSIS — Z7189 Other specified counseling: Secondary | ICD-10-CM | POA: Diagnosis not present

## 2020-04-21 DIAGNOSIS — Z9989 Dependence on other enabling machines and devices: Secondary | ICD-10-CM

## 2020-04-21 NOTE — Patient Instructions (Signed)

## 2020-04-25 ENCOUNTER — Ambulatory Visit (LOCAL_COMMUNITY_HEALTH_CENTER): Payer: Self-pay

## 2020-04-25 ENCOUNTER — Other Ambulatory Visit: Payer: Self-pay

## 2020-04-25 DIAGNOSIS — Z111 Encounter for screening for respiratory tuberculosis: Secondary | ICD-10-CM

## 2020-04-28 ENCOUNTER — Other Ambulatory Visit: Payer: Self-pay

## 2020-04-28 ENCOUNTER — Ambulatory Visit (LOCAL_COMMUNITY_HEALTH_CENTER): Payer: Self-pay

## 2020-04-28 DIAGNOSIS — Z111 Encounter for screening for respiratory tuberculosis: Secondary | ICD-10-CM

## 2020-04-28 LAB — TB SKIN TEST
Induration: 0 mm
TB Skin Test: NEGATIVE

## 2020-05-01 ENCOUNTER — Other Ambulatory Visit: Payer: Self-pay | Admitting: Family Medicine

## 2020-05-01 NOTE — Telephone Encounter (Signed)
FYI Pt scheduled for 05/05/2020

## 2020-05-01 NOTE — Telephone Encounter (Signed)
Requested medication (s) are due for refill today: no  Requested medication (s) are on the active medication list: yes  Last refill:  04/09/2020  Future visit scheduled: no  Notes to clinic: due for follow up  Vm left for patient to callback    Requested Prescriptions  Pending Prescriptions Disp Refills   potassium chloride SA (KLOR-CON) 20 MEQ tablet [Pharmacy Med Name: POTASSIUM CHL 20MEQ MICRO T 20 Tablet] 90 tablet 10    Sig: TAKE 1 TABLET BY Alpha      Endocrinology:  Minerals - Potassium Supplementation Failed - 05/01/2020  2:42 PM      Failed - K in normal range and within 360 days    Potassium  Date Value Ref Range Status  02/19/2020 3.3 (L) 3.5 - 5.1 mmol/L Final    Comment:    HEMOLYSIS AT THIS LEVEL MAY AFFECT RESULT  01/15/2014 3.9 3.5 - 5.1 mmol/L Final          Passed - Cr in normal range and within 360 days    Creatinine  Date Value Ref Range Status  01/15/2014 1.36 (H) 0.60 - 1.30 mg/dL Final   Creatinine, Ser  Date Value Ref Range Status  02/19/2020 0.64 0.44 - 1.00 mg/dL Final          Passed - Valid encounter within last 12 months    Recent Outpatient Visits           5 months ago Essential hypertension   [name] [name], NP   9 months ago Hearing loss due to cerumen impaction, left   [facility], [place] T, NP   10 months ago Essential hypertension   [facility] [place], [place]   10 months ago Low back pain due to bilateral sciatica   [place], [place]   11 months ago Low back pain due to bilateral sciatica   [facility] [facility], [place]

## 2020-05-01 NOTE — Telephone Encounter (Signed)
Routing to provider  

## 2020-05-05 ENCOUNTER — Ambulatory Visit: Payer: Medicaid Other | Admitting: Nurse Practitioner

## 2020-05-05 NOTE — Progress Notes (Deleted)
   There were no vitals taken for this visit.   Subjective:    Patient ID: [name], female    DOB: 04-21-1965, 55 y.o.   MRN: [id number]  HPI: [name] is a 55 y.o. female  No chief complaint on file.   1: Chronic heart failure with preserved ejection fraction- - Follow up with Cardiology every 3 months.  Was supposed to get an updated ECHO but no showed for appointment x2. - NYHA class II - wearing oxygen at 2L during the day and at bedtime.  2: HTN- - BP elevated but she hasn't taken her medications yet today; says that she will take them as soon as she gets home -Blood pressures are not checked at home  3: Obstructive sleep apnea- - wearing CPAP nightly - reports sleeping well  4: Lymphedema- - no current problems  5. COPD - Patient wears 2L during the day and at bedtime. -SOB at baseline   Relevant past medical, surgical, family and social history reviewed and updated as indicated. Interim medical history since our last visit reviewed. Allergies and medications reviewed and updated.  Review of Systems  Per HPI unless specifically indicated above     Objective:    There were no vitals taken for this visit.  Wt Readings from Last 3 Encounters:  04/21/20 224 lb (101.6 kg)  02/26/20 229 lb 6 oz (104 kg)  02/19/20 224 lb (101.6 kg)    Physical Exam  Results for orders placed or performed in visit on 04/25/20  TB Skin Test  Result Value Ref Range   TB Skin Test Negative    Induration 0 mm      Assessment & Plan:   Problem List Items Addressed This Visit   None      Follow up plan: No follow-ups on file.

## 2020-05-24 NOTE — Progress Notes (Deleted)
HPI   [name] is a 55 y/o female with a history of pulmonary embolus, obstructive sleep apnea, atrial fibrillation, COPD, gout, HTN, CVA, remote tobacco use and chronic heart failure.   Echo report from 03/26/2018 reviewed and showed an EF of 60-65% along with mild/ moderate TR. Echo report from 06/17/16 reviewed and showed an EF of 55-65%. Had a pharmacologic stress trest done 09/12/12 and had an estimated EF of 49%.   Catheterization done 03/27/2018 showed normal coronary arteries and normal left ventricular function.  Was in the ED 02/20/20 due to shortness of breath & cough. Chest CT negative for PE. Treated for COPD exacerbation and HTN and was released.   She presents today for a follow-up visit with a chief complaint of    Past Medical History:  Diagnosis Date  . A-fib ([place]) 2010  . Adrenal adenoma, left   . Alcohol abuse   . Allergy   . CHF (congestive heart failure) ([place])   . COPD (chronic obstructive pulmonary disease) ([place])   . Fatty liver   . GERD (gastroesophageal reflux disease)   . Gout   . Hypertension   . Mild cognitive impairment   . Non-healing non-surgical wound    right leg  . Obesity   . Pulmonary embolus ([place])   . Sleep apnea    uses Cpap  . Stroke ([place])   . Thyroid disease    Past Surgical History:  Procedure Laterality Date  . bilateral wrist fractures  2010  . LEFT HEART CATH AND CORONARY ANGIOGRAPHY N/A 03/27/2018   Procedure: LEFT HEART CATH AND CORONARY ANGIOGRAPHY;  Surgeon: Isaias Cowman, MD;  Location: [facility];  Service: Cardiovascular;  Laterality: N/A;  . lt ankle fracture  2002   Family History  Problem Relation Age of Onset  . Prostate cancer Father   . Rectal cancer Sister   . Breast cancer Sister   . Cancer Maternal Grandmother   . Cancer Maternal Grandfather    Social History   Tobacco Use  . Smoking status: Former Smoker    Types: Cigarettes    Quit date: 01/26/2016    Years since quitting: 4.3  . Smokeless  tobacco: Never Used  Substance Use Topics  . Alcohol use: Yes    Comment: Occassionally   Allergies  Allergen Reactions  . Morphine And Related Hives  . Penicillins Other (See Comments)    Painful urination Has patient had a PCN reaction causing immediate rash, facial/tongue/throat swelling, SOB or lightheadedness with hypotension: yes Has patient had a PCN reaction causing severe rash involving mucus membranes or skin necrosis: no Has patient had a PCN reaction that required hospitalization no Has patient had a PCN reaction occurring within the last 10 years: no If all of the above answers are "NO", then may proceed with Cephalosporin use.      Review of Systems  Constitutional: Positive for fatigue (with moderate exertion). Negative for appetite change.  HENT: Negative for congestion and postnasal drip.   Eyes: Positive for visual disturbance (blurry vision).  Respiratory: Positive for chest tightness, shortness of breath and wheezing. Negative for cough.   Cardiovascular: Positive for palpitations. Negative for chest pain and leg swelling.  Gastrointestinal: Negative for abdominal distention and abdominal pain.  Endocrine: Negative.   Genitourinary: Negative.   Musculoskeletal: Negative for arthralgias and back pain.  Skin: Negative.   Allergic/Immunologic: Negative.   Neurological: Negative for dizziness and light-headedness.  Hematological: Negative for adenopathy. Does not bruise/bleed easily.  Psychiatric/Behavioral: Negative for dysphoric mood and sleep disturbance (wearing CPAP nightly; sleeping on 2 pillows). The patient is not nervous/anxious.       Physical Exam Vitals and nursing note reviewed.  Constitutional:      Appearance: She is well-developed.  HENT:     Head: Normocephalic and atraumatic.  Neck:     Vascular: No JVD.  Cardiovascular:     Rate and Rhythm: Normal rate and regular rhythm.  Pulmonary:     Effort: Pulmonary effort is normal. No  respiratory distress.     Breath sounds: No wheezing or rales.  Abdominal:     General: There is no distension.     Palpations: Abdomen is soft.     Tenderness: There is no abdominal tenderness.  Musculoskeletal:        General: No tenderness.     Cervical back: Normal range of motion and neck supple.     Right lower leg: No tenderness. No edema.     Left lower leg: No tenderness. No edema.  Skin:    General: Skin is warm and dry.  Neurological:     Mental Status: She is alert and oriented to person, place, and time.  Psychiatric:        Behavior: Behavior normal.        Thought Content: Thought content normal.    Assessment & Plan:  1: Chronic heart failure with preserved ejection fraction- - NYHA class II - euvolemic today - weighing daily; reminded to call for an overnight weight gain of >2 pounds or a weekly weight gain of >5 pounds.  - weight 229.6 pound from last visit here 3 months ago - not adding salt to her food. Reminded to closely follow a 2000mg  sodium diet  - saw cardiology Margarito Courser) 02/27/20 - wearing oxygen at 2L during the day and at bedtime; patient on room air today with sat of 95% - did not show for echo scheduled for 12/05/19; will r/s at next visit if not done with cardiology group - BNP 10/31/2018 was 59.0   2: HTN- - BP  - saw PCP Julian Hy) 11/15/19 - reviewed BMP done 02/19/20 and it showed sodium 142, potassium 3.3, creatinine 0.64 and GFR >60  3: Obstructive sleep apnea- - wearing CPAP nightly - saw pulmonology Humphrey Rolls) 04/01/20  4: Lymphedema- - resolved   Patient did not bring her medications nor a list. Each medication was verbally reviewed with the patient and she was encouraged to bring the bottles to every visit to confirm accuracy of list.

## 2020-05-26 ENCOUNTER — Ambulatory Visit: Payer: Medicaid Other | Admitting: Family

## 2020-05-26 ENCOUNTER — Telehealth: Payer: Self-pay | Admitting: Family

## 2020-05-26 NOTE — Telephone Encounter (Signed)
Patient did not show for her [facility] appointment on 05/26/20. Will attempt to reschedule.

## 2020-05-29 ENCOUNTER — Emergency Department
Admission: EM | Admit: 2020-05-29 | Discharge: 2020-05-29 | Disposition: A | Discharge status: Home / Self Care | Payer: Medicaid Other | Attending: Emergency Medicine | Admitting: Emergency Medicine

## 2020-05-29 ENCOUNTER — Other Ambulatory Visit: Payer: Self-pay | Admitting: Unknown Physician Specialty

## 2020-05-29 ENCOUNTER — Emergency Department: Payer: Medicaid Other

## 2020-05-29 ENCOUNTER — Other Ambulatory Visit: Payer: Self-pay | Admitting: Family Medicine

## 2020-05-29 ENCOUNTER — Other Ambulatory Visit: Payer: Self-pay

## 2020-05-29 DIAGNOSIS — Z7951 Long term (current) use of inhaled steroids: Secondary | ICD-10-CM | POA: Insufficient documentation

## 2020-05-29 DIAGNOSIS — R109 Unspecified abdominal pain: Secondary | ICD-10-CM | POA: Diagnosis not present

## 2020-05-29 DIAGNOSIS — Z8673 Personal history of transient ischemic attack (TIA), and cerebral infarction without residual deficits: Secondary | ICD-10-CM | POA: Insufficient documentation

## 2020-05-29 DIAGNOSIS — Z79899 Other long term (current) drug therapy: Secondary | ICD-10-CM | POA: Insufficient documentation

## 2020-05-29 DIAGNOSIS — I5032 Chronic diastolic (congestive) heart failure: Secondary | ICD-10-CM | POA: Insufficient documentation

## 2020-05-29 DIAGNOSIS — K219 Gastro-esophageal reflux disease without esophagitis: Secondary | ICD-10-CM | POA: Diagnosis not present

## 2020-05-29 DIAGNOSIS — Z87891 Personal history of nicotine dependence: Secondary | ICD-10-CM | POA: Insufficient documentation

## 2020-05-29 DIAGNOSIS — R079 Chest pain, unspecified: Secondary | ICD-10-CM | POA: Diagnosis present

## 2020-05-29 DIAGNOSIS — J449 Chronic obstructive pulmonary disease, unspecified: Secondary | ICD-10-CM | POA: Insufficient documentation

## 2020-05-29 DIAGNOSIS — Z7982 Long term (current) use of aspirin: Secondary | ICD-10-CM | POA: Insufficient documentation

## 2020-05-29 DIAGNOSIS — I110 Hypertensive heart disease with heart failure: Secondary | ICD-10-CM | POA: Diagnosis not present

## 2020-05-29 LAB — CBC
HCT: 42.5 % (ref 36.0–46.0)
Hemoglobin: 13.8 g/dL (ref 12.0–15.0)
MCH: 29.8 pg (ref 26.0–34.0)
MCHC: 32.5 g/dL (ref 30.0–36.0)
MCV: 91.8 fL (ref 80.0–100.0)
Platelets: 246 10*3/uL (ref 150–400)
RBC: 4.63 MIL/uL (ref 3.87–5.11)
RDW: 13.6 % (ref 11.5–15.5)
WBC: 8.6 10*3/uL (ref 4.0–10.5)
nRBC: 0 % (ref 0.0–0.2)

## 2020-05-29 LAB — BASIC METABOLIC PANEL
Anion gap: 10 (ref 5–15)
BUN: 17 mg/dL (ref 6–20)
CO2: 27 mmol/L (ref 22–32)
Calcium: 8.9 mg/dL (ref 8.9–10.3)
Chloride: 106 mmol/L (ref 98–111)
Creatinine, Ser: 0.69 mg/dL (ref 0.44–1.00)
GFR, Estimated: >60 mL/min (ref >60)
Glucose, Bld: 111 mg/dL — ABNORMAL HIGH (ref 70–99)
Potassium: 2.7 mmol/L — CL (ref 3.5–5.1)
Sodium: 143 mmol/L (ref 135–145)

## 2020-05-29 LAB — TROPONIN I (HIGH SENSITIVITY)
Troponin I (High Sensitivity): 8 ng/L (ref <18)
Troponin I (High Sensitivity): 8 ng/L (ref <18)

## 2020-05-29 MED ORDER — ALUM & MAG HYDROXIDE-SIMETH 200-200-20 MG/5ML PO SUSP
30.0000 mL | Freq: Once | ORAL | Status: AC
  Administered 2020-05-29: 30 mL via ORAL
  Filled 2020-05-29: qty 30

## 2020-05-29 MED ORDER — POTASSIUM CHLORIDE 20 MEQ PO PACK
40.0000 meq | PACK | Freq: Once | ORAL | Status: AC
  Administered 2020-05-29: 40 meq via ORAL
  Filled 2020-05-29: qty 2

## 2020-05-29 MED ORDER — LIDOCAINE VISCOUS HCL 2 % MT SOLN
15.0000 mL | Freq: Once | OROMUCOSAL | Status: AC
  Administered 2020-05-29: 15 mL via ORAL
  Filled 2020-05-29: qty 15

## 2020-05-29 NOTE — ED Notes (Signed)
ED Provider at bedside. 

## 2020-05-29 NOTE — Telephone Encounter (Signed)
No future visit scheduled at this time . 

## 2020-05-29 NOTE — ED Triage Notes (Signed)
Pt presents via EMS for c/o chest pain x 1 hour described as burning, 8/10.  Per EMS, pt was in PSVT en route, max HR 166.   BGL 115, RR 26, wears 2L [place] at home, BP 138/74  Hx of CHF, afib, anxiety.

## 2020-05-29 NOTE — ED Notes (Signed)
Pt. returned from XR. 

## 2020-05-29 NOTE — ED Provider Notes (Signed)
[facility] Emergency Department Provider Note   ____________________________________________   I have reviewed the triage vital signs and the nursing notes.   HISTORY  Chief Complaint Chest Pain   History limited by: Not Limited   HPI [name] is a 55 y.o. female who presents to the emergency department today because of concerns for chest pain.  Patient states that it started suddenly this afternoon.  She describes it as being located in the center of her chest.  It was burning.  She did have some sweating with it.  The time my exam she states that the burning and chest discomfort had significantly improved however she now is complaining of primarily some abdominal discomfort.  The patient denies any unusual exertion today.   Records reviewed. Per medical record review patient has a history of afib, COPD, CHF.   Past Medical History:  Diagnosis Date  . A-fib ([place]) 2010  . Adrenal adenoma, left   . Alcohol abuse   . Allergy   . CHF (congestive heart failure) ([place])   . COPD (chronic obstructive pulmonary disease) ([place])   . Fatty liver   . GERD (gastroesophageal reflux disease)   . Gout   . Hypertension   . Mild cognitive impairment   . Non-healing non-surgical wound    right leg  . Obesity   . Pulmonary embolus ([place])   . Sleep apnea    uses Cpap  . Stroke ([place])   . Thyroid disease     Patient Active Problem List   Diagnosis Date Noted  . OSA on CPAP 04/21/2020  . CPAP use counseling 04/21/2020  . Need for hepatitis B screening test 11/15/2019  . Hearing loss due to cerumen impaction, left 07/23/2019  . Lymphedema 12/06/2018  . IFG (impaired fasting glucose) 11/03/2018  . Hallucinations, visual 07/03/2018  . Unstable angina ([place]) 03/25/2018  . Gout 05/09/2017  . COPD (chronic obstructive pulmonary disease) ([place]) 04/10/2017  . Sleep apnea 04/10/2017  . History of pulmonary embolism 04/10/2017  . History of thyroid disease  04/10/2017  . Hyperlipidemia 12/31/2016  . Essential hypertension 05/26/2016  . Chronic diastolic heart failure ([place]) 05/26/2016  . Hypokalemia 05/26/2016  . Esophageal reflux 12/23/2008    Past Surgical History:  Procedure Laterality Date  . bilateral wrist fractures  2010  . LEFT HEART CATH AND CORONARY ANGIOGRAPHY N/A 03/27/2018   Procedure: LEFT HEART CATH AND CORONARY ANGIOGRAPHY;  Surgeon: Isaias Cowman, MD;  Location: [place] [facility];  Service: Cardiovascular;  Laterality: N/A;  . lt ankle fracture  2002    Prior to Admission medications   Medication Sig Start Date End Date Taking? Authorizing Provider  allopurinol (ZYLOPRIM) 300 MG tablet Take 1 tablet (300 mg total) by mouth daily. 07/02/19   Volney American, PA-C  amLODipine (NORVASC) 10 MG tablet Take 1 tablet (10 mg total) by mouth daily. 07/02/19   Volney American, PA-C  aspirin EC 81 MG tablet Take 81 mg by mouth daily.    [provider]  atorvastatin (LIPITOR) 40 MG tablet Take 40 mg by mouth daily. 09/21/19   [provider]  cloNIDine (CATAPRES) 0.1 MG tablet Take 1 tablet (0.1 mg total) by mouth 2 (two) times daily. 07/02/19   Volney American, PA-C  colchicine 0.6 MG tablet TAKE ONE (1) TABLET BY MOUTH TWICE DAILY AS NEEDED FOR GOUT FLARES 12/28/19   Cannady, Henrine Screws T, NP  furosemide (LASIX) 40 MG tablet Take 1 tablet (40 mg  total) by mouth daily. 07/02/19 11/28/19  Volney American, PA-C  gabapentin (NEURONTIN) 300 MG capsule 300 mg qAM, 600 mg qhs 02/01/19   Gillis Santa, MD  hydrALAZINE (APRESOLINE) 50 MG tablet Take 1 tablet (50 mg total) by mouth 3 (three) times daily. 01/04/20   Johnson, Megan P, DO  HYDROcodone-acetaminophen (NORCO/VICODIN) 5-325 MG tablet Take 1 tablet by mouth 3 (three) times daily as needed for moderate pain. 07/02/19   Volney American, PA-C  lisinopril (ZESTRIL) 40 MG tablet Take 1 tablet (40 mg total) by mouth daily. 07/02/19   Volney American, PA-C  metoprolol (TOPROL XL) 200 MG 24 hr tablet Take 1 tablet (200 mg total) by mouth daily. 07/02/19   Volney American, PA-C  ondansetron (ZOFRAN ODT) 4 MG disintegrating tablet Take 1 tablet (4 mg total) by mouth every 8 (eight) hours as needed. 04/25/18   Eula Listen, MD  pantoprazole (PROTONIX) 40 MG tablet TAKE 1 TABLET BY MOUTH EVERY DAY 05/29/20   [name], NP  potassium chloride SA (KLOR-CON) 20 MEQ tablet TAKE 1 TABLET BY MOUTH THREE TIMES DAILY 05/29/20   Jon Billings, NP  predniSONE (DELTASONE) 10 MG tablet Day 1-2: Take 50mg (5 pills) Day 3-4: Take 40mg (4) Day 5-6: 30mg (3) Day 7-8: 20mg (2) Day 9:10mg (1 Patient not taking: Reported on 02/26/2020 02/20/20   Merlyn Lot, MD  PROAIR HFA 108 [phone] Base) MCG/ACT inhaler INHALE TWO (2) PUFFS BY MOUTH EVERY 6 HOURS AS NEEDED FOR WHEEZING OR FOR SHORTNESS OF BREATH 05/29/20   [name], NP  sertraline (ZOLOFT) 100 MG tablet Take 1 tablet (100 mg total) by mouth daily. 07/02/19   Volney American, PA-C  spironolactone (ALDACTONE) 25 MG tablet Take 1 tablet (25 mg total) by mouth daily. 11/28/19 02/26/20  Alisa Graff, FNP  tiotropium (SPIRIVA HANDIHALER) 18 MCG inhalation capsule Place 1 capsule (18 mcg total) into inhaler and inhale daily. 07/02/19   Volney American, PA-C    Allergies Morphine and related and Penicillins  Family History  Problem Relation Age of Onset  . Prostate cancer Father   . Rectal cancer Sister   . Breast cancer Sister   . Cancer Maternal Grandmother   . Cancer Maternal Grandfather     Social History Social History   Tobacco Use  . Smoking status: Former Smoker    Types: Cigarettes    Quit date: 01/26/2016    Years since quitting: 4.3  . Smokeless tobacco: Never Used  Vaping Use  . Vaping Use: Never used  Substance Use Topics  . Alcohol use: Yes    Comment: Occassionally  . Drug use: No    Review of Systems Constitutional: No fever/chills Eyes: No visual  changes. ENT: No sore throat. Cardiovascular: Positive for chest pain. Respiratory: Denies shortness of breath. Gastrointestinal: Positive for upper abdominal discomfort.  Genitourinary: Negative for dysuria. Musculoskeletal: Negative for back pain. Skin: Negative for rash. Neurological: Negative for headaches, focal weakness or numbness.  ____________________________________________   PHYSICAL EXAM:  VITAL SIGNS: ED Triage Vitals  Enc Vitals Group     BP 05/29/20 1701 116/89     Pulse Rate 05/29/20 1701 88     Resp 05/29/20 1701 20     Temp 05/29/20 1701 98.1 F (36.7 C)     Temp Source 05/29/20 1701 Oral     SpO2 05/29/20 1701 97 %     Weight 05/29/20 1705 224 lb 10.4 oz (101.9 kg)     Height 05/29/20 1705 5\' 9"  (  1.753 m)    Constitutional: Alert and oriented.  Eyes: Conjunctivae are normal.  ENT      Head: Normocephalic and atraumatic.      Nose: No congestion/rhinnorhea.      Mouth/Throat: Mucous membranes are moist.      Neck: No stridor. Hematological/Lymphatic/Immunilogical: No cervical lymphadenopathy. Cardiovascular: Normal rate, regular rhythm.  No murmurs, rubs, or gallops.  Respiratory: Normal respiratory effort without tachypnea nor retractions. Breath sounds are clear and equal bilaterally. No wheezes/rales/rhonchi. Gastrointestinal: Soft and non tender. No rebound. No guarding.  Genitourinary: Deferred Musculoskeletal: Normal range of motion in all extremities. No lower extremity edema. Neurologic:  Normal speech and language. No gross focal neurologic deficits are appreciated.  Skin:  Skin is warm, dry and intact. No rash noted. Psychiatric: Mood and affect are normal. Speech and behavior are normal. Patient exhibits appropriate insight and judgment.  ____________________________________________    LABS (pertinent positives/negatives)  CBC wbc 8.6, hgb 13.8, plt 246 Trop hs 8 BMP na 143, k 2.7, glu 111, cr  0.69 ____________________________________________   EKG  I, Nance Pear, attending physician, personally viewed and interpreted this EKG  EKG Time: 1702 Rate: 102 Rhythm: sinus tachycardia Axis: left axis deviation Intervals: qtc 522 QRS: LVH ST changes: no st elevation Impression: abnormal ekg ____________________________________________    RADIOLOGY  CXR No active cardiopulmonary disease  ____________________________________________   PROCEDURES  Procedures  ____________________________________________   INITIAL IMPRESSION / ASSESSMENT AND PLAN / ED COURSE  Pertinent labs & imaging results that were available during my care of the patient were reviewed by me and considered in my medical decision making (see chart for details).   Patient presented to the emergency department today because of concerns for chest pain.  She described it as burning.  Patient had troponin negative x2.  Chest x-ray was essentially unchanged from previous.  While she does have a large mediastinum.  At this time I doubt dissection given the characterization of pain and the fact that it had significantly improved by the time my exam.  She did feel better after medication here.  I do wonder if patient does suffer from some aspect of gastritis/esophagitis.  Discussed this with the patient.  Will plan on discharging.    ____________________________________________   FINAL CLINICAL IMPRESSION(S) / ED DIAGNOSES  Final diagnoses:  Nonspecific chest pain     Note: This dictation was prepared with Dragon dictation. Any transcriptional errors that result from this process are unintentional     Nance Pear, MD 05/29/20 2217

## 2020-05-29 NOTE — Telephone Encounter (Signed)
Seen 6 months ago no future visit at this time

## 2020-05-29 NOTE — Discharge Instructions (Addendum)
Please seek medical attention for any high fevers, chest pain, shortness of breath, change in behavior, persistent vomiting, bloody stool or any other new or concerning symptoms.  

## 2020-06-26 ENCOUNTER — Other Ambulatory Visit: Payer: Self-pay | Admitting: Nurse Practitioner

## 2020-06-26 ENCOUNTER — Other Ambulatory Visit: Payer: Self-pay | Admitting: Family Medicine

## 2020-06-26 NOTE — Telephone Encounter (Signed)
  Notes to clinic:  Patient no show appt on 05/05/2020 Review for another refill  Last filled on 06/06/2020  Requested Prescriptions  Pending Prescriptions Disp Refills   cloNIDine (CATAPRES) 0.1 MG tablet [Pharmacy Med Name: CLONIDINE 0.1 MG TAB 0.1 Tablet] 60 tablet 10    Sig: TAKE ONE (1) TABLET BY MOUTH TWICE DAILY      Cardiovascular:  Alpha-2 Agonists Failed - 06/26/2020 11:57 AM      Failed - Valid encounter within last 6 months    Recent Outpatient Visits           7 months ago Essential hypertension   [facility] [address] [name], NP   11 months ago Hearing loss due to cerumen impaction, left   [place], Ruth T, NP   12 months ago Essential hypertension   [facility] Volney American, [place]   1 year ago Low back pain due to bilateral sciatica   [facility] Volney American, [place]   1 year ago Low back pain due to bilateral sciatica   [facility] Volney American, [place]                Passed - Last BP in normal range    BP Readings from Last 1 Encounters:  05/29/20 119/87          Passed - Last Heart Rate in normal range    Pulse Readings from Last 1 Encounters:  05/29/20 66

## 2020-06-26 NOTE — Telephone Encounter (Signed)
Attempted to call patient for follow up appointment- left message to call office. Courtesy #30 day RF given

## 2020-06-26 NOTE — Telephone Encounter (Signed)
Last seen 7 months ago . No future visit scheduled

## 2020-07-03 ENCOUNTER — Other Ambulatory Visit: Payer: Self-pay | Admitting: Family Medicine

## 2020-07-03 NOTE — Telephone Encounter (Signed)
Called pt to schedule appt no answer left vm

## 2020-07-03 NOTE — Telephone Encounter (Signed)
   Notes to clinic: review for refill Patient no showed last appt    Requested Prescriptions  Pending Prescriptions Disp Refills   furosemide (LASIX) 40 MG tablet [Pharmacy Med Name: FUROSEMIDE 40 MG TABLET 40 Tablet] 30 tablet 10    Sig: TAKE 1 TABLET BY MOUTH EVERY DAY      Cardiovascular:  Diuretics - Loop Failed - 07/03/2020 10:48 AM      Failed - K in normal range and within 360 days    Potassium  Date Value Ref Range Status  05/29/2020 2.7 (LL) 3.5 - 5.1 mmol/L Final    Comment:    CRITICAL RESULT CALLED TO, READ BACK BY AND VERIFIED WITH MARY ANDERSON @1749  05/29/20 MJU   01/15/2014 3.9 3.5 - 5.1 mmol/L Final          Failed - Valid encounter within last 6 months    Recent Outpatient Visits           7 months ago Essential hypertension   [place] [name], NP   11 months ago Hearing loss due to cerumen impaction, left   [place], [place] T, NP   1 year ago Essential hypertension   [place], [place], [place]   1 year ago Low back pain due to bilateral sciatica   [facility] Volney American, [place]   1 year ago Low back pain due to bilateral sciatica   [facility] Volney American, [place]                [place] in normal range and within 360 days    Calcium  Date Value Ref Range Status  05/29/2020 8.9 8.9 - 10.3 mg/dL Final   Calcium, Total  Date Value Ref Range Status  01/15/2014 8.6 8.5 - 10.1 mg/dL Final          Passed - Na in normal range and within 360 days    Sodium  Date Value Ref Range Status  05/29/2020 143 135 - 145 mmol/L Final  07/02/2019 143 134 - 144 mmol/L Final  01/15/2014 139 136 - 145 mmol/L Final          Passed - Cr in normal range and within 360 days    Creatinine  Date Value Ref Range Status  01/15/2014 1.36 (H) 0.60 - 1.30 mg/dL Final   Creatinine, Ser  Date Value Ref Range Status  05/29/2020 0.69 0.44 - 1.00  mg/dL Final          Passed - Last BP in normal range    BP Readings from Last 1 Encounters:  05/29/20 119/87

## 2020-07-03 NOTE — Telephone Encounter (Signed)
Called to schedule appt no answer left vm  

## 2020-07-04 ENCOUNTER — Other Ambulatory Visit: Payer: Self-pay | Admitting: Family Medicine

## 2020-07-04 NOTE — Telephone Encounter (Signed)
   Notes to clinic: patient is overdue for follow up appt Patient has not called back to schedule  Review for courtesy refill    Requested Prescriptions  Pending Prescriptions Disp Refills   cloNIDine (CATAPRES) 0.1 MG tablet [Pharmacy Med Name: CLONIDINE 0.1 MG TAB 0.1 Tablet] 60 tablet 10    Sig: TAKE ONE (1) TABLET BY MOUTH TWICE DAILY      Cardiovascular:  Alpha-2 Agonists Failed - 07/04/2020  1:16 PM      Failed - Valid encounter within last 6 months    Recent Outpatient Visits           7 months ago Essential hypertension   [facility] [name], NP   11 months ago Hearing loss due to cerumen impaction, left   [facility] [place], Henrine Screws T, NP   1 year ago Essential hypertension   [facility] Volney American, [place]   1 year ago Low back pain due to bilateral sciatica   [facility] Volney American, [place]   1 year ago Low back pain due to bilateral sciatica   [facility] Volney American, [place]                Passed - Last BP in normal range    BP Readings from Last 1 Encounters:  05/29/20 119/87          Passed - Last Heart Rate in normal range    Pulse Readings from Last 1 Encounters:  05/29/20 66

## 2020-07-04 NOTE — Telephone Encounter (Signed)
Called pt to schedule appt no answer left vm

## 2020-07-08 NOTE — Telephone Encounter (Signed)
Called to schedule appt no answer left vm  

## 2020-07-10 ENCOUNTER — Other Ambulatory Visit: Payer: Self-pay | Admitting: Family Medicine

## 2020-07-10 NOTE — Telephone Encounter (Signed)
Called pt to schedule appt no answer left vm

## 2020-07-10 NOTE — Telephone Encounter (Signed)
Requested medication (s) are due for refill today -yes  Requested medication (s) are on the active medication list -yes  Future visit scheduled -no  Last refill: 06/06/20  Notes to clinic: Duplicate request for RF- office attempted to call patient to schedule- pended rx- no response  Requested Prescriptions  Pending Prescriptions Disp Refills   furosemide (LASIX) 40 MG tablet [Pharmacy Med Name: FUROSEMIDE 40 MG TABLET 40 Tablet] 30 tablet 10    Sig: TAKE 1 TABLET BY MOUTH EVERY DAY      Cardiovascular:  Diuretics - Loop Failed - 07/10/2020  2:58 PM      Failed - K in normal range and within 360 days    Potassium  Date Value Ref Range Status  05/29/2020 2.7 (LL) 3.5 - 5.1 mmol/L Final    Comment:    CRITICAL RESULT CALLED TO, READ BACK BY AND VERIFIED WITH MARY ANDERSON @1749  05/29/20 MJU   01/15/2014 3.9 3.5 - 5.1 mmol/L Final          Failed - Valid encounter within last 6 months    Recent Outpatient Visits           7 months ago Essential hypertension   [facility] [name], NP   11 months ago Hearing loss due to cerumen impaction, left   [facility], [place] T, NP   1 year ago Essential hypertension   [place] [place], [place]   1 year ago Low back pain due to bilateral sciatica   [facility] Volney American, [place]   1 year ago Low back pain due to bilateral sciatica   [facility] Volney American, [place]                [place] in normal range and within 360 days    Calcium  Date Value Ref Range Status  05/29/2020 8.9 8.9 - 10.3 mg/dL Final   Calcium, Total  Date Value Ref Range Status  01/15/2014 8.6 8.5 - 10.1 mg/dL Final          Passed - Na in normal range and within 360 days    Sodium  Date Value Ref Range Status  05/29/2020 143 135 - 145 mmol/L Final  07/02/2019 143 134 - 144 mmol/L Final  01/15/2014 139 136 - 145 mmol/L Final           Passed - Cr in normal range and within 360 days    Creatinine  Date Value Ref Range Status  01/15/2014 1.36 (H) 0.60 - 1.30 mg/dL Final   Creatinine, Ser  Date Value Ref Range Status  05/29/2020 0.69 0.44 - 1.00 mg/dL Final          Passed - Last BP in normal range    BP Readings from Last 1 Encounters:  05/29/20 119/87              Requested Prescriptions  Pending Prescriptions Disp Refills   furosemide (LASIX) 40 MG tablet [Pharmacy Med Name: FUROSEMIDE 40 MG TABLET 40 Tablet] 30 tablet 10    Sig: TAKE 1 TABLET BY MOUTH EVERY DAY      Cardiovascular:  Diuretics - Loop Failed - 07/10/2020  2:58 PM      Failed - K in normal range and within 360 days    Potassium  Date Value Ref Range Status  05/29/2020 2.7 (LL) 3.5 - 5.1 mmol/L Final    Comment:    CRITICAL RESULT  CALLED TO, READ BACK BY AND VERIFIED WITH MARY ANDERSON @1749  05/29/20 MJU   01/15/2014 3.9 3.5 - 5.1 mmol/L Final          Failed - Valid encounter within last 6 months    Recent Outpatient Visits           7 months ago Essential hypertension   [facility] [name], NP   11 months ago Hearing loss due to cerumen impaction, left   [place], [place] T, NP   1 year ago Essential hypertension   [facility] Volney American, [place]   1 year ago Low back pain due to bilateral sciatica   [facility] Volney American, [place]   1 year ago Low back pain due to bilateral sciatica   [facility] Volney American, [place]                [place] in normal range and within 360 days    Calcium  Date Value Ref Range Status  05/29/2020 8.9 8.9 - 10.3 mg/dL Final   Calcium, Total  Date Value Ref Range Status  01/15/2014 8.6 8.5 - 10.1 mg/dL Final          Passed - Na in normal range and within 360 days    Sodium  Date Value Ref Range Status  05/29/2020 143 135 - 145 mmol/L Final  07/02/2019 143 134 - 144  mmol/L Final  01/15/2014 139 136 - 145 mmol/L Final          Passed - Cr in normal range and within 360 days    Creatinine  Date Value Ref Range Status  01/15/2014 1.36 (H) 0.60 - 1.30 mg/dL Final   Creatinine, Ser  Date Value Ref Range Status  05/29/2020 0.69 0.44 - 1.00 mg/dL Final          Passed - Last BP in normal range    BP Readings from Last 1 Encounters:  05/29/20 119/87

## 2020-07-14 NOTE — Telephone Encounter (Signed)
Lvm to make this apt. 

## 2020-07-15 ENCOUNTER — Encounter: Payer: Self-pay | Admitting: Nurse Practitioner

## 2020-07-15 NOTE — Telephone Encounter (Signed)
Called [facility] sent letter.

## 2020-07-15 NOTE — Telephone Encounter (Signed)
Called [facility] sent letter

## 2020-07-15 NOTE — Telephone Encounter (Signed)
FYI

## 2020-10-02 ENCOUNTER — Inpatient Hospital Stay
Admission: EM | Admit: 2020-10-02 | Discharge: 2020-10-04 | DRG: 854 | Disposition: A | Discharge status: Home / Self Care | Payer: Medicaid Other | Attending: Internal Medicine | Admitting: Internal Medicine

## 2020-10-02 ENCOUNTER — Inpatient Hospital Stay: Payer: Medicaid Other | Admitting: Registered Nurse

## 2020-10-02 ENCOUNTER — Encounter
Admission: EM | Disposition: A | Discharge status: Home / Self Care | Payer: Self-pay | Source: Home / Self Care | Attending: Internal Medicine

## 2020-10-02 ENCOUNTER — Emergency Department: Payer: Medicaid Other

## 2020-10-02 ENCOUNTER — Other Ambulatory Visit: Payer: Self-pay

## 2020-10-02 ENCOUNTER — Inpatient Hospital Stay: Payer: Medicaid Other

## 2020-10-02 DIAGNOSIS — E785 Hyperlipidemia, unspecified: Secondary | ICD-10-CM | POA: Diagnosis present

## 2020-10-02 DIAGNOSIS — Z885 Allergy status to narcotic agent status: Secondary | ICD-10-CM | POA: Diagnosis not present

## 2020-10-02 DIAGNOSIS — A4150 Gram-negative sepsis, unspecified: Secondary | ICD-10-CM | POA: Diagnosis not present

## 2020-10-02 DIAGNOSIS — E669 Obesity, unspecified: Secondary | ICD-10-CM | POA: Diagnosis present

## 2020-10-02 DIAGNOSIS — N12 Tubulo-interstitial nephritis, not specified as acute or chronic: Secondary | ICD-10-CM

## 2020-10-02 DIAGNOSIS — Z880 Allergy status to penicillin: Secondary | ICD-10-CM

## 2020-10-02 DIAGNOSIS — I480 Paroxysmal atrial fibrillation: Secondary | ICD-10-CM | POA: Diagnosis present

## 2020-10-02 DIAGNOSIS — N390 Urinary tract infection, site not specified: Secondary | ICD-10-CM | POA: Diagnosis not present

## 2020-10-02 DIAGNOSIS — A419 Sepsis, unspecified organism: Principal | ICD-10-CM

## 2020-10-02 DIAGNOSIS — E876 Hypokalemia: Secondary | ICD-10-CM | POA: Diagnosis present

## 2020-10-02 DIAGNOSIS — N139 Obstructive and reflux uropathy, unspecified: Secondary | ICD-10-CM | POA: Diagnosis not present

## 2020-10-02 DIAGNOSIS — I110 Hypertensive heart disease with heart failure: Secondary | ICD-10-CM | POA: Diagnosis present

## 2020-10-02 DIAGNOSIS — Z20822 Contact with and (suspected) exposure to covid-19: Secondary | ICD-10-CM | POA: Diagnosis present

## 2020-10-02 DIAGNOSIS — N136 Pyonephrosis: Secondary | ICD-10-CM | POA: Diagnosis present

## 2020-10-02 DIAGNOSIS — N10 Acute tubulo-interstitial nephritis: Secondary | ICD-10-CM | POA: Diagnosis not present

## 2020-10-02 DIAGNOSIS — Z6833 Body mass index (BMI) 33.0-33.9, adult: Secondary | ICD-10-CM

## 2020-10-02 DIAGNOSIS — Z86711 Personal history of pulmonary embolism: Secondary | ICD-10-CM | POA: Diagnosis not present

## 2020-10-02 DIAGNOSIS — F32A Depression, unspecified: Secondary | ICD-10-CM | POA: Diagnosis present

## 2020-10-02 DIAGNOSIS — N200 Calculus of kidney: Secondary | ICD-10-CM

## 2020-10-02 DIAGNOSIS — Z87891 Personal history of nicotine dependence: Secondary | ICD-10-CM | POA: Diagnosis not present

## 2020-10-02 DIAGNOSIS — K219 Gastro-esophageal reflux disease without esophagitis: Secondary | ICD-10-CM | POA: Diagnosis present

## 2020-10-02 DIAGNOSIS — M109 Gout, unspecified: Secondary | ICD-10-CM | POA: Diagnosis present

## 2020-10-02 DIAGNOSIS — G4733 Obstructive sleep apnea (adult) (pediatric): Secondary | ICD-10-CM | POA: Diagnosis present

## 2020-10-02 DIAGNOSIS — E039 Hypothyroidism, unspecified: Secondary | ICD-10-CM | POA: Diagnosis present

## 2020-10-02 DIAGNOSIS — I5032 Chronic diastolic (congestive) heart failure: Secondary | ICD-10-CM | POA: Diagnosis present

## 2020-10-02 DIAGNOSIS — Z79899 Other long term (current) drug therapy: Secondary | ICD-10-CM

## 2020-10-02 DIAGNOSIS — Z8673 Personal history of transient ischemic attack (TIA), and cerebral infarction without residual deficits: Secondary | ICD-10-CM

## 2020-10-02 DIAGNOSIS — J449 Chronic obstructive pulmonary disease, unspecified: Secondary | ICD-10-CM | POA: Diagnosis present

## 2020-10-02 DIAGNOSIS — N3080 Other cystitis without hematuria: Secondary | ICD-10-CM | POA: Diagnosis not present

## 2020-10-02 DIAGNOSIS — Z7982 Long term (current) use of aspirin: Secondary | ICD-10-CM

## 2020-10-02 DIAGNOSIS — K760 Fatty (change of) liver, not elsewhere classified: Secondary | ICD-10-CM | POA: Diagnosis present

## 2020-10-02 DIAGNOSIS — D72829 Elevated white blood cell count, unspecified: Secondary | ICD-10-CM | POA: Diagnosis not present

## 2020-10-02 DIAGNOSIS — N201 Calculus of ureter: Secondary | ICD-10-CM

## 2020-10-02 HISTORY — PX: CYSTOSCOPY WITH STENT PLACEMENT: SHX5790

## 2020-10-02 LAB — CBC
HCT: 45.4 % (ref 36.0–46.0)
Hemoglobin: 14.7 g/dL (ref 12.0–15.0)
MCH: 29.6 pg (ref 26.0–34.0)
MCHC: 32.4 g/dL (ref 30.0–36.0)
MCV: 91.5 fL (ref 80.0–100.0)
Platelets: 228 10*3/uL (ref 150–400)
RBC: 4.96 MIL/uL (ref 3.87–5.11)
RDW: 14.7 % (ref 11.5–15.5)
WBC: 11.8 10*3/uL — ABNORMAL HIGH (ref 4.0–10.5)
nRBC: 0 % (ref 0.0–0.2)

## 2020-10-02 LAB — COMPREHENSIVE METABOLIC PANEL
ALT: 14 U/L (ref 0–44)
AST: 19 U/L (ref 15–41)
Albumin: 3.6 g/dL (ref 3.5–5.0)
Alkaline Phosphatase: 51 U/L (ref 38–126)
Anion gap: 11 (ref 5–15)
BUN: 11 mg/dL (ref 6–20)
CO2: 26 mmol/L (ref 22–32)
Calcium: 8.8 mg/dL — ABNORMAL LOW (ref 8.9–10.3)
Chloride: 102 mmol/L (ref 98–111)
Creatinine, Ser: 0.82 mg/dL (ref 0.44–1.00)
GFR, Estimated: >60 mL/min (ref >60)
Glucose, Bld: 98 mg/dL (ref 70–99)
Potassium: 2.9 mmol/L — ABNORMAL LOW (ref 3.5–5.1)
Sodium: 139 mmol/L (ref 135–145)
Total Bilirubin: 2.4 mg/dL — ABNORMAL HIGH (ref 0.3–1.2)
Total Protein: 6.9 g/dL (ref 6.5–8.1)

## 2020-10-02 LAB — URINALYSIS, COMPLETE (UACMP) WITH MICROSCOPIC
Glucose, UA: NEGATIVE mg/dL
Ketones, ur: NEGATIVE mg/dL
Nitrite: POSITIVE — AB
Protein, ur: 30 mg/dL — AB
Specific Gravity, Urine: 1.015 (ref 1.005–1.030)
WBC, UA: >50 WBC/hpf (ref 0–5)
pH: 5.5 (ref 5.0–8.0)

## 2020-10-02 LAB — RESP PANEL BY RT-PCR (FLU A&B, COVID) ARPGX2
Influenza A by PCR: NEGATIVE
Influenza B by PCR: NEGATIVE
SARS Coronavirus 2 by RT PCR: NEGATIVE

## 2020-10-02 LAB — LACTIC ACID, PLASMA: Lactic Acid, Venous: 0.7 mmol/L (ref 0.5–1.9)

## 2020-10-02 LAB — LIPASE, BLOOD: Lipase: 26 U/L (ref 11–51)

## 2020-10-02 LAB — MAGNESIUM: Magnesium: 1.7 mg/dL (ref 1.7–2.4)

## 2020-10-02 SURGERY — CYSTOSCOPY, WITH STENT INSERTION
Anesthesia: General | Laterality: Left

## 2020-10-02 MED ORDER — ACETAMINOPHEN 10 MG/ML IV SOLN
INTRAVENOUS | Status: AC
  Filled 2020-10-02: qty 100

## 2020-10-02 MED ORDER — PANTOPRAZOLE SODIUM 40 MG PO TBEC
40.0000 mg | DELAYED_RELEASE_TABLET | Freq: Every day | ORAL | Status: DC
  Administered 2020-10-03 – 2020-10-04 (×2): 40 mg via ORAL
  Filled 2020-10-02 (×2): qty 1

## 2020-10-02 MED ORDER — ALBUTEROL SULFATE HFA 108 (90 BASE) MCG/ACT IN AERS: 2.0000 | INHALATION_SPRAY | RESPIRATORY_TRACT | Status: DC | PRN

## 2020-10-02 MED ORDER — ACETAMINOPHEN 325 MG PO TABS
650.0000 mg | ORAL_TABLET | Freq: Four times a day (QID) | ORAL | Status: DC | PRN
  Administered 2020-10-03: 650 mg via ORAL
  Filled 2020-10-02: qty 2

## 2020-10-02 MED ORDER — LISINOPRIL 10 MG PO TABS
40.0000 mg | ORAL_TABLET | Freq: Every day | ORAL | Status: DC
  Administered 2020-10-03: 40 mg via ORAL
  Filled 2020-10-02: qty 4

## 2020-10-02 MED ORDER — ONDANSETRON HCL 4 MG/2ML IJ SOLN
4.0000 mg | Freq: Once | INTRAMUSCULAR | Status: AC
  Administered 2020-10-02: 4 mg via INTRAVENOUS
  Filled 2020-10-02: qty 2

## 2020-10-02 MED ORDER — ONDANSETRON HCL 4 MG/2ML IJ SOLN: 4.0000 mg | Freq: Once | INTRAMUSCULAR | Status: DC | PRN

## 2020-10-02 MED ORDER — ACETAMINOPHEN 650 MG RE SUPP
650.0000 mg | Freq: Four times a day (QID) | RECTAL | Status: DC | PRN
  Filled 2020-10-02: qty 1

## 2020-10-02 MED ORDER — MIDAZOLAM HCL 2 MG/2ML IJ SOLN
INTRAMUSCULAR | Status: AC
  Filled 2020-10-02: qty 2

## 2020-10-02 MED ORDER — METOPROLOL SUCCINATE ER 50 MG PO TB24
200.0000 mg | ORAL_TABLET | Freq: Every day | ORAL | Status: DC
  Administered 2020-10-03: 200 mg via ORAL
  Filled 2020-10-02: qty 4

## 2020-10-02 MED ORDER — SODIUM CHLORIDE 0.9 % IV SOLN
1.0000 g | Freq: Once | INTRAVENOUS | Status: AC
  Administered 2020-10-02: 1 g via INTRAVENOUS
  Filled 2020-10-02: qty 1

## 2020-10-02 MED ORDER — LACTATED RINGERS IV SOLN: INTRAVENOUS | Status: DC | PRN

## 2020-10-02 MED ORDER — CLONIDINE HCL 0.1 MG PO TABS
0.1000 mg | ORAL_TABLET | Freq: Three times a day (TID) | ORAL | Status: DC
  Administered 2020-10-02: 0.1 mg via ORAL
  Filled 2020-10-02: qty 1

## 2020-10-02 MED ORDER — FUROSEMIDE 40 MG PO TABS
40.0000 mg | ORAL_TABLET | Freq: Every day | ORAL | Status: DC
  Administered 2020-10-03: 40 mg via ORAL
  Filled 2020-10-02: qty 1

## 2020-10-02 MED ORDER — PROPOFOL 10 MG/ML IV BOLUS
INTRAVENOUS | Status: AC
  Filled 2020-10-02: qty 20

## 2020-10-02 MED ORDER — SODIUM CHLORIDE 0.9 % IV SOLN
2.0000 g | INTRAVENOUS | Status: DC
  Administered 2020-10-03: 2 g via INTRAVENOUS
  Filled 2020-10-02 (×2): qty 20

## 2020-10-02 MED ORDER — HYDRALAZINE HCL 50 MG PO TABS
50.0000 mg | ORAL_TABLET | Freq: Three times a day (TID) | ORAL | Status: DC
  Administered 2020-10-02: 50 mg via ORAL
  Filled 2020-10-02: qty 1

## 2020-10-02 MED ORDER — AMLODIPINE BESYLATE 5 MG PO TABS
10.0000 mg | ORAL_TABLET | Freq: Every day | ORAL | Status: DC
  Administered 2020-10-03: 10 mg via ORAL
  Filled 2020-10-02: qty 2

## 2020-10-02 MED ORDER — GABAPENTIN 300 MG PO CAPS
300.0000 mg | ORAL_CAPSULE | Freq: Every morning | ORAL | Status: DC
  Administered 2020-10-03 – 2020-10-04 (×2): 300 mg via ORAL
  Filled 2020-10-02: qty 1

## 2020-10-02 MED ORDER — ONDANSETRON HCL 4 MG PO TABS: 4.0000 mg | ORAL_TABLET | Freq: Four times a day (QID) | ORAL | Status: DC | PRN

## 2020-10-02 MED ORDER — LIDOCAINE HCL (CARDIAC) PF 100 MG/5ML IV SOSY
PREFILLED_SYRINGE | INTRAVENOUS | Status: DC | PRN
  Administered 2020-10-02: 100 mg via INTRAVENOUS

## 2020-10-02 MED ORDER — MAGNESIUM HYDROXIDE 400 MG/5ML PO SUSP: 30.0000 mL | Freq: Every day | ORAL | Status: DC | PRN

## 2020-10-02 MED ORDER — POTASSIUM CHLORIDE 20 MEQ PO PACK
40.0000 meq | PACK | Freq: Once | ORAL | Status: AC
  Administered 2020-10-02: 40 meq via ORAL

## 2020-10-02 MED ORDER — ONDANSETRON HCL 4 MG/2ML IJ SOLN
INTRAMUSCULAR | Status: DC | PRN
  Administered 2020-10-02: 4 mg via INTRAVENOUS

## 2020-10-02 MED ORDER — SODIUM CHLORIDE 0.9 % IV SOLN: INTRAVENOUS | Status: DC

## 2020-10-02 MED ORDER — MAGNESIUM SULFATE 2 GM/50ML IV SOLN: 2.0000 g | Freq: Once | INTRAVENOUS | Status: DC

## 2020-10-02 MED ORDER — DEXAMETHASONE SODIUM PHOSPHATE 10 MG/ML IJ SOLN
INTRAMUSCULAR | Status: DC | PRN
Start: 2020-10-02 — End: 2020-10-02
  Administered 2020-10-02: 5 mg via INTRAVENOUS

## 2020-10-02 MED ORDER — SUCCINYLCHOLINE CHLORIDE 200 MG/10ML IV SOSY
PREFILLED_SYRINGE | INTRAVENOUS | Status: AC
  Filled 2020-10-02: qty 10

## 2020-10-02 MED ORDER — SERTRALINE HCL 50 MG PO TABS
100.0000 mg | ORAL_TABLET | Freq: Every day | ORAL | Status: DC
  Administered 2020-10-03 – 2020-10-04 (×2): 100 mg via ORAL
  Filled 2020-10-02 (×2): qty 2

## 2020-10-02 MED ORDER — ALBUTEROL SULFATE (2.5 MG/3ML) 0.083% IN NEBU: 2.5000 mg | INHALATION_SOLUTION | RESPIRATORY_TRACT | Status: DC | PRN

## 2020-10-02 MED ORDER — COLCHICINE 0.6 MG PO TABS: 0.6000 mg | ORAL_TABLET | Freq: Two times a day (BID) | ORAL | Status: DC | PRN

## 2020-10-02 MED ORDER — TRAZODONE HCL 50 MG PO TABS
25.0000 mg | ORAL_TABLET | Freq: Every evening | ORAL | Status: DC | PRN
  Administered 2020-10-03: 25 mg via ORAL
  Filled 2020-10-02: qty 1

## 2020-10-02 MED ORDER — GABAPENTIN 300 MG PO CAPS: 300.0000 mg | ORAL_CAPSULE | ORAL | Status: DC

## 2020-10-02 MED ORDER — ALLOPURINOL 300 MG PO TABS
300.0000 mg | ORAL_TABLET | Freq: Every day | ORAL | Status: DC
  Administered 2020-10-03 – 2020-10-04 (×2): 300 mg via ORAL
  Filled 2020-10-02 (×2): qty 1

## 2020-10-02 MED ORDER — ATORVASTATIN CALCIUM 20 MG PO TABS
40.0000 mg | ORAL_TABLET | Freq: Every day | ORAL | Status: DC
  Administered 2020-10-03 – 2020-10-04 (×2): 40 mg via ORAL
  Filled 2020-10-02 (×2): qty 2

## 2020-10-02 MED ORDER — FENTANYL CITRATE (PF) 100 MCG/2ML IJ SOLN
INTRAMUSCULAR | Status: AC
  Filled 2020-10-02: qty 2

## 2020-10-02 MED ORDER — 0.9 % SODIUM CHLORIDE (POUR BTL) OPTIME
TOPICAL | Status: DC | PRN
  Administered 2020-10-02: 100 mL

## 2020-10-02 MED ORDER — ONDANSETRON HCL 4 MG/2ML IJ SOLN: 4.0000 mg | Freq: Four times a day (QID) | INTRAMUSCULAR | Status: DC | PRN

## 2020-10-02 MED ORDER — TIOTROPIUM BROMIDE MONOHYDRATE 18 MCG IN CAPS
18.0000 ug | ORAL_CAPSULE | Freq: Every day | RESPIRATORY_TRACT | Status: DC
  Administered 2020-10-03 – 2020-10-04 (×2): 18 ug via RESPIRATORY_TRACT
  Filled 2020-10-02: qty 5

## 2020-10-02 MED ORDER — FENTANYL CITRATE (PF) 100 MCG/2ML IJ SOLN: 25.0000 ug | INTRAMUSCULAR | Status: DC | PRN

## 2020-10-02 MED ORDER — PROPOFOL 10 MG/ML IV BOLUS
INTRAVENOUS | Status: DC | PRN
  Administered 2020-10-02: 50 mg via INTRAVENOUS
  Administered 2020-10-02: 150 mg via INTRAVENOUS

## 2020-10-02 MED ORDER — POTASSIUM CHLORIDE 10 MEQ/100ML IV SOLN
10.0000 meq | Freq: Once | INTRAVENOUS | Status: AC
  Administered 2020-10-02: 10 meq via INTRAVENOUS
  Filled 2020-10-02: qty 100

## 2020-10-02 MED ORDER — LIDOCAINE HCL (PF) 2 % IJ SOLN
INTRAMUSCULAR | Status: AC
  Filled 2020-10-02: qty 5

## 2020-10-02 MED ORDER — POTASSIUM CHLORIDE 20 MEQ PO PACK
40.0000 meq | PACK | Freq: Once | ORAL | Status: AC
  Administered 2020-10-02: 40 meq via ORAL
  Filled 2020-10-02: qty 2

## 2020-10-02 MED ORDER — ACETAMINOPHEN 10 MG/ML IV SOLN
INTRAVENOUS | Status: DC | PRN
  Administered 2020-10-02: 1000 mg via INTRAVENOUS

## 2020-10-02 MED ORDER — SODIUM CHLORIDE 0.9 % IV SOLN
1.0000 g | Freq: Once | INTRAVENOUS | Status: AC
  Administered 2020-10-02: 1 g via INTRAVENOUS
  Filled 2020-10-02: qty 10

## 2020-10-02 MED ORDER — ENOXAPARIN SODIUM 60 MG/0.6ML IJ SOSY
0.5000 mg/kg | PREFILLED_SYRINGE | INTRAMUSCULAR | Status: DC
  Administered 2020-10-03 – 2020-10-04 (×2): 50 mg via SUBCUTANEOUS
  Filled 2020-10-02 (×2): qty 0.6

## 2020-10-02 MED ORDER — GABAPENTIN 300 MG PO CAPS
600.0000 mg | ORAL_CAPSULE | Freq: Every day | ORAL | Status: DC
  Administered 2020-10-02 – 2020-10-03 (×2): 600 mg via ORAL
  Filled 2020-10-02 (×3): qty 2

## 2020-10-02 MED ORDER — POTASSIUM CHLORIDE CRYS ER 20 MEQ PO TBCR
20.0000 meq | EXTENDED_RELEASE_TABLET | Freq: Three times a day (TID) | ORAL | Status: DC
  Administered 2020-10-02 – 2020-10-04 (×5): 20 meq via ORAL
  Filled 2020-10-02 (×4): qty 1

## 2020-10-02 MED ORDER — IOHEXOL 350 MG/ML SOLN
80.0000 mL | Freq: Once | INTRAVENOUS | Status: AC | PRN
  Administered 2020-10-02: 80 mL via INTRAVENOUS
  Filled 2020-10-02: qty 80

## 2020-10-02 MED ORDER — ROCURONIUM BROMIDE 10 MG/ML (PF) SYRINGE
PREFILLED_SYRINGE | INTRAVENOUS | Status: AC
  Filled 2020-10-02: qty 10

## 2020-10-02 MED ORDER — FENTANYL CITRATE (PF) 100 MCG/2ML IJ SOLN
INTRAMUSCULAR | Status: DC | PRN
  Administered 2020-10-02 (×4): 25 ug via INTRAVENOUS

## 2020-10-02 MED ORDER — SPIRONOLACTONE 25 MG PO TABS
25.0000 mg | ORAL_TABLET | Freq: Every day | ORAL | Status: DC
  Administered 2020-10-03: 25 mg via ORAL
  Filled 2020-10-02: qty 1

## 2020-10-02 SURGICAL SUPPLY — 23 items
BAG DRAIN CYSTO-URO LG1000N (MISCELLANEOUS) ×2 IMPLANT
BAG URO DRAIN 4000ML (MISCELLANEOUS) ×2 IMPLANT
BRUSH SCRUB EZ 1% IODOPHOR (MISCELLANEOUS) ×2 IMPLANT
CATH FOL 2WAY LX 16X30 (CATHETERS) ×2 IMPLANT
CATH URETL OPEN 5X70 (CATHETERS) ×2 IMPLANT
GAUZE 4X4 16PLY ~~LOC~~+RFID DBL (SPONGE) ×4 IMPLANT
GLOVE SURG UNDER POLY LF SZ7.5 (GLOVE) ×2 IMPLANT
GOWN STRL REUS W/ TWL LRG LVL3 (GOWN DISPOSABLE) ×1 IMPLANT
GOWN STRL REUS W/ TWL XL LVL3 (GOWN DISPOSABLE) ×1 IMPLANT
GOWN STRL REUS W/TWL LRG LVL3 (GOWN DISPOSABLE) ×1
GOWN STRL REUS W/TWL XL LVL3 (GOWN DISPOSABLE) ×1
GUIDEWIRE STR DUAL SENSOR (WIRE) ×2 IMPLANT
IV NS IRRIG 3000ML ARTHROMATIC (IV SOLUTION) ×2 IMPLANT
KIT TURNOVER CYSTO (KITS) ×2 IMPLANT
PACK CYSTO AR (MISCELLANEOUS) ×2 IMPLANT
SET CYSTO W/LG BORE CLAMP LF (SET/KITS/TRAYS/PACK) ×2 IMPLANT
STENT URET 6FRX24 CONTOUR (STENTS) IMPLANT
STENT URET 6FRX26 CONTOUR (STENTS) ×2 IMPLANT
SURGILUBE 2OZ TUBE FLIPTOP (MISCELLANEOUS) ×2 IMPLANT
SYR TOOMEY IRRIG 70ML (MISCELLANEOUS)
SYRINGE TOOMEY IRRIG 70ML (MISCELLANEOUS) IMPLANT
WATER STERILE IRR 1000ML POUR (IV SOLUTION) ×2 IMPLANT
WATER STERILE IRR 500ML POUR (IV SOLUTION) ×2 IMPLANT

## 2020-10-02 NOTE — H&P (Signed)
[place]   PATIENT NAME: [name]    MR#:  [id number]  DATE OF BIRTH:  17-May-1965  DATE OF ADMISSION:  10/02/2020  PRIMARY CARE PHYSICIAN: Jon Billings, NP   Patient is coming from: Home  REQUESTING/REFERRING PHYSICIAN: Merlyn Lot, MD  CHIEF COMPLAINT:   Chief Complaint  Patient presents with   Abdominal Pain   Generalized Body Aches    HISTORY OF PRESENT ILLNESS:  [name] is a 55 y.o. female with medical history significant for atrial fibrillation, CHF, COPD, GERD, hypertension, CVA and hypothyroidism, presented to the ER with acute onset of left-sided flank pain which has been intermittent over the last week and got significantly worse over the last couple of days.  She graded at 10/10 in severity and it was associated with mild nausea and vomiting as well as tactile fever without chills.  She denies any dyspnea or cough or wheezing.  She has been having urinary frequency and urgency without dysuria or hematuria. ED Course: When she came to the ER blood pressure was 134/91 with a heart rate of 110 and pulse symmetry was 99% on 2 L of O2 by nasal cannula and respiratory rate was normal then 22 and later 26.  Labs revealed hypokalemia of 2.9 with magnesium 1.7 with otherwise unremarkable CMP.  Imaging: Chest x-ray showed the following: Mild bibasilar subsegmental atelectasis or infiltrates are noted. Abdominal and pelvic CT scan showed the following: 1. Left hydroureteronephrosis to the level of a 5 mm stone in the proximal ureter, with an edematous left kidney and periureteric/perinephric stranding recommend correlation with laboratory values to exclude superimposed infection. 2. Punctate 1 mm nonobstructing right renal stone. 3. Mild wall thickening of an incompletely distended urinary bladder. Correlate with urinalysis to exclude cystitis. 4. Colonic diverticulosis without findings of acute diverticulitis. 5. Diffuse hepatic steatosis. 6.  Stable 2.9 cm left adrenal adenoma.  The patient was given 1 g of IV Rocephin and 4 mg IV Zofran as well as 10 mEq IV potassium chloride.  Dr. Glori Luis was notified about the patient and the patient was taken to the OR for left ureteral stent.  She will be admitted to a medical bed for further evaluation and management. PAST MEDICAL HISTORY:   Past Medical History:  Diagnosis Date   A-fib [facility]) 2010   Adrenal adenoma, left    Alcohol abuse    Allergy    CHF (congestive heart failure) (HCC)    COPD (chronic obstructive pulmonary disease) (HCC)    Fatty liver    GERD (gastroesophageal reflux disease)    Gout    Hypertension    Mild cognitive impairment    Non-healing non-surgical wound    right leg   Obesity    Pulmonary embolus (HCC)    Sleep apnea    uses Cpap   Stroke ([place])    Thyroid disease     PAST SURGICAL HISTORY:   Past Surgical History:  Procedure Laterality Date   bilateral wrist fractures  2010   LEFT HEART CATH AND CORONARY ANGIOGRAPHY N/A 03/27/2018   Procedure: LEFT HEART CATH AND CORONARY ANGIOGRAPHY;  Surgeon: Isaias Cowman, MD;  Location: [facility];  Service: Cardiovascular;  Laterality: N/A;   lt ankle fracture  2002    SOCIAL HISTORY:   Social History   Tobacco Use   Smoking status: Former    Types: Cigarettes    Quit date: 01/26/2016    Years since quitting: 4.6  Smokeless tobacco: Never  Substance Use Topics   Alcohol use: Yes    Comment: Occassionally    FAMILY HISTORY:   Family History  Problem Relation Age of Onset   Prostate cancer Father    Rectal cancer Sister    Breast cancer Sister    Cancer Maternal Grandmother    Cancer Maternal Grandfather     DRUG ALLERGIES:   Allergies  Allergen Reactions   Morphine And Related Hives   Penicillins Other (See Comments)    Painful urination Has patient had a PCN reaction causing immediate rash, facial/tongue/throat swelling, SOB or lightheadedness with hypotension:  yes Has patient had a PCN reaction causing severe rash involving mucus membranes or skin necrosis: no Has patient had a PCN reaction that required hospitalization no Has patient had a PCN reaction occurring within the last 10 years: no If all of the above answers are "NO", then may proceed with Cephalosporin use.     REVIEW OF SYSTEMS:   ROS As per history of present illness. All pertinent systems were reviewed above. Constitutional, HEENT, cardiovascular, respiratory, GI, GU, musculoskeletal, neuro, psychiatric, endocrine, integumentary and hematologic systems were reviewed and are otherwise negative/unremarkable except for positive findings mentioned above in the HPI.   MEDICATIONS AT HOME:   Prior to Admission medications   Medication Sig Start Date End Date Taking? Authorizing Provider  allopurinol (ZYLOPRIM) 300 MG tablet TAKE 1 TABLET BY MOUTH EVERY DAY 06/26/20  Yes Jon Billings, NP  amLODipine (NORVASC) 10 MG tablet TAKE 1 TABLET BY MOUTH EVERY DAY 06/26/20  Yes Jon Billings, NP  aspirin EC 81 MG tablet Take 81 mg by mouth daily.   Yes [provider]  atorvastatin (LIPITOR) 40 MG tablet Take 40 mg by mouth daily. 09/21/19  Yes [provider]  cloNIDine (CATAPRES) 0.1 MG tablet TAKE ONE (1) TABLET BY MOUTH TWICE DAILY 07/09/20  Yes Jon Billings, NP  colchicine 0.6 MG tablet TAKE ONE (1) TABLET BY MOUTH TWICE DAILY AS NEEDED FOR GOUT FLARES 12/28/19  Yes Cannady, Jolene T, NP  gabapentin (NEURONTIN) 300 MG capsule 300 mg qAM, 600 mg qhs 02/01/19  Yes Lateef, Bilal, MD  hydrALAZINE (APRESOLINE) 50 MG tablet TAKE 1 TABLET BY MOUTH THREE TIMES DAILY 06/26/20  Yes Jon Billings, NP  lisinopril (ZESTRIL) 40 MG tablet Take 1 tablet (40 mg total) by mouth daily. 07/02/19  Yes Volney American, PA-C  metoprolol (TOPROL XL) 200 MG 24 hr tablet Take 1 tablet (200 mg total) by mouth daily. 07/02/19  Yes Volney American, PA-C  pantoprazole (PROTONIX) 40 MG  tablet TAKE 1 TABLET BY MOUTH EVERY DAY 05/29/20  Yes [name], NP  potassium chloride SA (KLOR-CON) 20 MEQ tablet TAKE 1 TABLET BY MOUTH THREE TIMES DAILY 06/26/20  Yes Jon Billings, NP  PROAIR HFA 108 [phone] Base) MCG/ACT inhaler INHALE TWO (2) PUFFS BY MOUTH EVERY 6 HOURS AS NEEDED FOR WHEEZING OR FOR SHORTNESS OF BREATH 05/29/20  Yes [name], NP  sertraline (ZOLOFT) 100 MG tablet Take 1 tablet (100 mg total) by mouth daily. 07/02/19  Yes Volney American, PA-C  tiotropium (SPIRIVA HANDIHALER) 18 MCG inhalation capsule Place 1 capsule (18 mcg total) into inhaler and inhale daily. 07/02/19  Yes Volney American, PA-C  furosemide (LASIX) 40 MG tablet Take 1 tablet (40 mg total) by mouth daily. 07/02/19 11/28/19  Volney American, PA-C  spironolactone (ALDACTONE) 25 MG tablet Take 1 tablet (25 mg total) by mouth daily. 11/28/19 02/26/20  Darylene Price A, FNP      VITAL SIGNS:  Blood pressure (!) 135/97, pulse 94, temperature 99.8 F (37.7 C), temperature source Oral, resp. rate 18, height '5\' 9"'$  (1.753 m), weight 101.6 kg, SpO2 98 %.  PHYSICAL EXAMINATION:  Physical Exam  GENERAL:  55 y.o.-year-old female patient lying in the bed with no acute distress.  EYES: Pupils equal, round, reactive to light and accommodation. No scleral icterus. Extraocular muscles intact.  HEENT: Head atraumatic, normocephalic. Oropharynx and nasopharynx clear.  NECK:  Supple, no jugular venous distention. No thyroid enlargement, no tenderness.  LUNGS: Normal breath sounds bilaterally, no wheezing, rales,rhonchi or crepitation. No use of accessory muscles of respiration.  CARDIOVASCULAR: Regular rate and rhythm, S1, S2 normal. No murmurs, rubs, or gallops.  ABDOMEN: Soft, nondistended, nontender. Bowel sounds present. No organomegaly or mass.  She had minimal left CVA tenderness. EXTREMITIES: No pedal edema, cyanosis, or clubbing.  NEUROLOGIC: Cranial nerves II through XII are intact. Muscle strength  5/5 in all extremities. Sensation intact. Gait not checked.  PSYCHIATRIC: The patient is alert and oriented x 3.  Normal affect and good eye contact. SKIN: No obvious rash, lesion, or ulcer.   LABORATORY PANEL:   CBC Recent Labs  Lab 10/02/20 1429  WBC 11.8*  HGB 14.7  HCT 45.4  PLT 228   ------------------------------------------------------------------------------------------------------------------  Chemistries  Recent Labs  Lab 10/02/20 1429  NA 139  K 2.9*  CL 102  CO2 26  GLUCOSE 98  BUN 11  CREATININE 0.82  CALCIUM 8.8*  MG 1.7  AST 19  ALT 14  ALKPHOS 51  BILITOT 2.4*   ------------------------------------------------------------------------------------------------------------------  Cardiac Enzymes No results for input(s): TROPONINI in the last 168 hours. ------------------------------------------------------------------------------------------------------------------  RADIOLOGY:  CT ABDOMEN PELVIS W CONTRAST  Result Date: 10/02/2020 CLINICAL DATA:  Left lower quadrant abdominal pain. EXAM: CT ABDOMEN AND PELVIS WITH CONTRAST TECHNIQUE: Multidetector CT imaging of the abdomen and pelvis was performed using the standard protocol following bolus administration of intravenous contrast. CONTRAST:  14m OMNIPAQUE IOHEXOL 350 MG/ML SOLN COMPARISON:  Multiple priors including CT February 20, 2020 and December 04, 2019 FINDINGS: Lower chest: Hypoventilatory changes in lung bases. Tortuous thoracic aorta. Hepatobiliary: Diffuse hepatic steatosis. Gallbladder is unremarkable. No biliary ductal dilation. Pancreas: No evidence of acute inflammation or pancreatic ductal dilation. Spleen: Within normal limits. Adrenals/Urinary Tract: 2.9 cm left adrenal lesion is stable since January 2014 and most consistent with a benign adrenal adenoma. Right adrenal glands unremarkable. No right-sided hydronephrosis. Punctate 1 mm right interpolar renal stone. Subcentimeter hypodense right  renal lesions too small to accurately characterize but statistically likely represent cysts. Left hydroureteronephrosis to the level of a 5 mm stone in the proximal ureter. Kidney is edematous with perinephric and periureteric inflammatory stranding. Hypodense 1.2 cm left upper pole renal lesion which is stable dating back to April 12, 2017 with Hounsfield units of water density consistent with a cyst. Additional subcentimeter left renal lesions are too small to accurately characterize but statistically likely represent cysts. Mild wall thickening of an incompletely distended urinary bladder. Stomach/Bowel: Small hiatal hernia otherwise stomach is unremarkable for degree of distension. No pathologic dilation of small or large bowel. The appendix and terminal ileum appear normal. Colonic diverticulosis without findings of acute diverticulitis. Vascular/Lymphatic: Minimal aortic atherosclerosis without aneurysmal dilation. No pathologically enlarged abdominal or pelvic lymph nodes. Reproductive: Uterus and bilateral adnexa are unremarkable. Other: No walled off fluid collections.  No pneumoperitoneum. Musculoskeletal: Remote bilateral lower rib fractures. Remote bilateral inferior pubic  rami fractures. Remote parasymphyseal superior pubic ramus fracture. Multilevel degenerative changes spine. No acute osseous abnormality. IMPRESSION: 1. Left hydroureteronephrosis to the level of a 5 mm stone in the proximal ureter, with an edematous left kidney and periureteric/perinephric stranding recommend correlation with laboratory values to exclude superimposed infection. 2. Punctate 1 mm nonobstructing right renal stone. 3. Mild wall thickening of an incompletely distended urinary bladder. Correlate with urinalysis to exclude cystitis. 4. Colonic diverticulosis without findings of acute diverticulitis. 5. Diffuse hepatic steatosis. 6. Stable 2.9 cm left adrenal adenoma. Electronically Signed   By: Dahlia Bailiff M.D.   On:  10/02/2020 18:36   DG Chest Portable 1 View  Result Date: 10/02/2020 CLINICAL DATA:  Shortness of breath, fever. EXAM: PORTABLE CHEST 1 VIEW COMPARISON:  May 29, 2020. FINDINGS: Stable cardiomegaly. No pneumothorax or pleural effusion is noted. Old left rib fractures are noted. Mild bibasilar subsegmental atelectasis or infiltrates are noted. IMPRESSION: Mild bibasilar subsegmental atelectasis or infiltrates are noted. Electronically Signed   By: Marijo Conception M.D.   On: 10/02/2020 17:37   DG OR UROLOGY CYSTO IMAGE ([facility] ONLY)  Result Date: 10/02/2020 There is no interpretation for this exam.  This order is for images obtained during a surgical procedure.  Please See "Surgeries" Tab for more information regarding the procedure.      IMPRESSION AND PLAN:  Active Problems:   Acute unilateral obstructive uropathy  1.  Acute left obstructive uropathy due to left ureteral stone with subsequent hydronephrosis and associated pyelonephritis and subsequent sepsis as manifested by tachycardia and tachypnea. -The patient will be admitted to a medical bed. - She will be hydrated with IV normal saline. - We will continue antibiotic therapy with IV Rocephin. - We will follow urine and blood cultures. - Dr. Glori Luis was consulted and will be following with [place].  2.  Hypokalemia. - Potassium will be replaced and magnesium level will be optimized  3.  Essential hypertension. - We will continue Norvasc, Toprol-XL, hydralazine and Catapres.  4.  Dyslipidemia. - We will continue statin therapy.  5.  Compensated chronic diastolic CHF. - We will continue Toprol-XL, Aldactone and Zestril.  6.  GERD. - We will continue PPI therapy.  7.  COPD. - We will continue ProAir and Spiriva.  8.  Depression. - We will continue Zoloft.  9.  Gout. - We will continue allopurinol and colchicine.  10.  Obstructive sleep apnea. - We will continue her CPAP.  11.  Paroxysmal atrial fibrillation, currently in  normal sinus rhythm. - We will obtain an EKG and continue Toprol-XL while monitoring her rhythm here. - She is currently on no anticoagulation.  DVT prophylaxis: Lovenox. Code Status: full code. Family Communication:  The plan of care was discussed in details with the patient (and family). I answered all questions. The patient agreed to proceed with the above mentioned plan. Further management will depend upon hospital course. Disposition Plan: Back to previous home environment Consults called: Allergy.  Neuro All the records are reviewed and case discussed with ED provider.  Status is: Inpatient  Remains inpatient appropriate because:Ongoing active pain requiring inpatient pain management, Ongoing diagnostic testing needed not appropriate for outpatient work up, Unsafe d/c plan, IV treatments appropriate due to intensity of illness or inability to take PO, and Inpatient level of care appropriate due to severity of illness  Dispo: The patient is from: Home              Anticipated d/c is to: Home  Patient currently is not medically stable to d/c.   Difficult to place patient No    TOTAL TIME TAKING CARE OF THIS PATIENT: 55 minutes.    Christel Mormon M.D on 10/02/2020 at 7:53 PM  [facility]   From 7 PM-7 AM, contact night-coverage [email or web address]  CC: Primary care physician; Jon Billings, NP

## 2020-10-02 NOTE — Transfer of Care (Signed)
Immediate Anesthesia Transfer of Care Note  Patient: [name]  Procedure(s) Performed: CYSTOSCOPY WITH STENT PLACEMENT (Left)  Patient Location: PACU  Anesthesia Type:General  Level of Consciousness: sedated  Airway & Oxygen Therapy: Patient Spontanous Breathing and Patient connected to face mask oxygen  Post-op Assessment: Report given to RN and Post -op Vital signs reviewed and stable  Post vital signs: Reviewed and stable  Last Vitals:  Vitals Value Taken Time  BP 101/71 10/02/20 2041  Temp    Pulse 92 10/02/20 2043  Resp 16 10/02/20 2043  SpO2 100 % 10/02/20 2043  Vitals shown include unvalidated device data.  Last Pain:  Vitals:   10/02/20 1426  TempSrc: Oral  PainSc: 10-Worst pain ever         Complications: No notable events documented.

## 2020-10-02 NOTE — Consult Note (Signed)
Urology Consult  I have been asked to see the patient by Dr. Quentin Cornwall, for evaluation and management of left ureteral stone, UTI, possible sepsis from urinary source.  Chief Complaint: Left-sided flank pain  HPI:  [name] is a 55 y.o. year old with a number of comorbidities including A. fib, CHF, COPD, obesity, stroke who presents with 2 to 3 days of left-sided flank and groin pain.  She felt like she had a fever this morning.  Work-up in the ER notable for 5 mm left proximal ureteral stone with upstream hydronephrosis, urinalysis grossly concerning for infection, temperature 100 degrees, tachycardic, and leukocytosis of 11.8k.  She denies any alleviating or aggravating factors.  No prior history of kidney stones.  PMH: Past Medical History:  Diagnosis Date   A-fib ([place]) 2010   Adrenal adenoma, left    Alcohol abuse    Allergy    CHF (congestive heart failure) (HCC)    COPD (chronic obstructive pulmonary disease) (HCC)    Fatty liver    GERD (gastroesophageal reflux disease)    Gout    Hypertension    Mild cognitive impairment    Non-healing non-surgical wound    right leg   Obesity    Pulmonary embolus (HCC)    Sleep apnea    uses Cpap   Stroke [facility])    Thyroid disease     Surgical History: Past Surgical History:  Procedure Laterality Date   bilateral wrist fractures  2010   LEFT HEART CATH AND CORONARY ANGIOGRAPHY N/A 03/27/2018   Procedure: LEFT HEART CATH AND CORONARY ANGIOGRAPHY;  Surgeon: Isaias Cowman, MD;  Location: [facility];  Service: Cardiovascular;  Laterality: N/A;   lt ankle fracture  2002      Allergies:  Allergies  Allergen Reactions   Morphine And Related Hives   Penicillins Other (See Comments)    Painful urination Has patient had a PCN reaction causing immediate rash, facial/tongue/throat swelling, SOB or lightheadedness with hypotension: yes Has patient had a PCN reaction causing severe rash involving mucus membranes  or skin necrosis: no Has patient had a PCN reaction that required hospitalization no Has patient had a PCN reaction occurring within the last 10 years: no If all of the above answers are "NO", then may proceed with Cephalosporin use.     Family History: Family History  Problem Relation Age of Onset   Prostate cancer Father    Rectal cancer Sister    Breast cancer Sister    Cancer Maternal Grandmother    Cancer Maternal Grandfather     Social History:  reports that she quit smoking about 4 years ago. Her smoking use included cigarettes. She has never used smokeless tobacco. She reports current alcohol use. She reports that she does not use drugs.  ROS: Negative aside from those stated in the HPI.  Physical Exam: BP (!) 135/97   Pulse 94   Temp 99.8 F (37.7 C) (Oral)   Resp 18   Ht '5\' 9"'$  (1.753 m)   Wt 101.6 kg   SpO2 98%   BMI 33.08 kg/m    Constitutional:  Alert and oriented, No acute distress. Cardiovascular: Irregularly irregular Respiratory: Diminished bilaterally GI: Abdomen is soft, nontender, nondistended, no abdominal masses GU: Left CVA tenderness Lymph: No cervical or inguinal lymphadenopathy. Skin: No rashes, bruises or suspicious lesions. Neurologic: Grossly intact, no focal deficits, moving all 4 extremities. Psychiatric: Normal mood and affect.  Laboratory Data: Reviewed in epic, see  HPI  Pertinent Imaging: I have personally reviewed the CT showing a 5 mm left proximal ureteral stone with moderate upstream hydronephrosis, no other renal stones.  Assessment & Plan:   Comorbid 55 year old female with a 5 mm left proximal ureteral stone and urinalysis concerning for infection with early signs of sepsis from urinary source.  We discussed the need for drainage in the setting of an infected and obstructed system.  A ureteral stent is a small plastic tube that is placed cystoscopically with one end in the kidney and the other end in the bladder that allows  the infection from the kidney to drain, and relieves pain from the obstructing stone.  We discussed the risks at length including bleeding, infection, sepsis, death, ureteral injury, and stent related symptoms including urgency/frequency/dysuria/flank pain/gross hematuria.  There is a low, but not 0, risk of inability to pass the ureteral stent alongside the stone from below which would require percutaneous nephrostomy tube by interventional radiology.  Finally, we discussed possible prolonged hospitalization and recovery, possible temporary Foley catheter placement, and 10 to 14-day course of antibiotics.  We reviewed the need for a follow-up procedure for definitive management of their stone when the infection has been treated in 2 to 3 weeks with ureteroscopy/laser lithotripsy.   Recommendations: OR for left ureteral stent placement tonight Agree with antibiotics and admission, follow-up cultures Will need outpatient ureteroscopy and laser lithotripsy in 2 to 3 weeks after infection treated  Billey Co, MD   [facility] [address] [place], [place] [address] [phone]

## 2020-10-02 NOTE — Anesthesia Procedure Notes (Signed)
Procedure Name: LMA Insertion Date/Time: 10/02/2020 8:20 PM Performed by: Hedda Slade, CRNA Pre-anesthesia Checklist: Patient identified, Patient being monitored, Timeout performed, Emergency Drugs available and Suction available Patient Re-evaluated:Patient Re-evaluated prior to induction Oxygen Delivery Method: Circle system utilized Preoxygenation: Pre-oxygenation with 100% oxygen Induction Type: IV induction Ventilation: Mask ventilation without difficulty LMA: LMA inserted LMA Size: 3.5 Tube type: Oral Number of attempts: 1 Placement Confirmation: positive ETCO2 and breath sounds checked- equal and bilateral Tube secured with: Tape Dental Injury: Teeth and Oropharynx as per pre-operative assessment

## 2020-10-02 NOTE — Progress Notes (Signed)
PHARMACIST - PHYSICIAN COMMUNICATION  CONCERNING:  Enoxaparin (Lovenox) for DVT Prophylaxis    RECOMMENDATION: Patient was prescribed enoxaprin '40mg'$  q24 hours for VTE prophylaxis.   Filed Weights   10/02/20 1427  Weight: 101.6 kg (224 lb)    Body mass index is 33.08 kg/m.  Estimated Creatinine Clearance: 99.5 mL/min (by C-G formula based on SCr of 0.82 mg/dL).   Based on [place] patient is candidate for enoxaparin 0.'5mg'$ /kg TBW SQ every 24 hours based on BMI being >30.  DESCRIPTION: Pharmacy has adjusted enoxaparin dose per [facility] policy.  Patient is now receiving enoxaparin 0.5 mg/kg every 24 hours   Renda Rolls, PharmD, [facility] 10/02/2020 11:16 PM

## 2020-10-02 NOTE — ED Triage Notes (Signed)
Pt to ER via [facility] from home with complaints of NVD/ LUQ abdominal pain, and a productive cough x1.5 weeks. No new shortness of breath. No known covid exposures.   Wears 2L via [place] chronic.

## 2020-10-02 NOTE — Anesthesia Preprocedure Evaluation (Addendum)
Anesthesia Evaluation  Patient identified by MRN, date of birth, ID band Patient awake    Reviewed: Allergy & Precautions, H&P , NPO status , Patient's Chart, lab work & pertinent test results, reviewed documented beta blocker date and time   Airway Mallampati: III  TM Distance: >3 FB Neck ROM: full    Dental  (+) Teeth Intact, Missing, Partial Upper   Pulmonary neg pulmonary ROS, sleep apnea and Continuous Positive Airway Pressure Ventilation , COPD, former smoker,    Pulmonary exam normal        Cardiovascular Exercise Tolerance: Good hypertension, On Medications + angina with exertion +CHF  negative cardio ROS Normal cardiovascular exam Rate:Normal     Neuro/Psych PSYCHIATRIC DISORDERS CVA, No Residual Symptoms    GI/Hepatic negative GI ROS, Neg liver ROS, GERD  Medicated,  Endo/Other  negative endocrine ROS  Renal/GU negative Renal ROS  negative genitourinary   Musculoskeletal   Abdominal   Peds  Hematology negative hematology ROS (+)   Anesthesia Other Findings   Reproductive/Obstetrics negative OB ROS                             Anesthesia Physical Anesthesia Plan  ASA: 3 and emergent  Anesthesia Plan: General LMA   Post-op Pain Management:    Induction:   PONV Risk Score and Plan:   Airway Management Planned:   Additional Equipment:   Intra-op Plan:   Post-operative Plan:   Informed Consent: I have reviewed the patients History and Physical, chart, labs and discussed the procedure including the risks, benefits and alternatives for the proposed anesthesia with the patient or authorized representative who has indicated his/her understanding and acceptance.       Plan Discussed with: CRNA  Anesthesia Plan Comments:         Anesthesia Quick Evaluation

## 2020-10-02 NOTE — ED Provider Notes (Signed)
[facility] Emergency Department Provider Note    Event Date/Time   First MD Initiated Contact with Patient 10/02/20 1645     (approximate)  I have reviewed the triage vital signs and the nursing notes.   HISTORY  Chief Complaint Abdominal Pain and Generalized Body Aches    HPI [name] is a 55 y.o. female with below listed past medical history presents to the ER for evaluation of 3 days of intractable nausea vomiting as well as back pain.  Denies any dysuria.  Has had cough and some congestion as well.  No recent antibiotics.  States he is having left lower quadrant abdominal pain as well.  Past Medical History:  Diagnosis Date   A-fib [facility]) 2010   Adrenal adenoma, left    Alcohol abuse    Allergy    CHF (congestive heart failure) (HCC)    COPD (chronic obstructive pulmonary disease) (HCC)    Fatty liver    GERD (gastroesophageal reflux disease)    Gout    Hypertension    Mild cognitive impairment    Non-healing non-surgical wound    right leg   Obesity    Pulmonary embolus (HCC)    Sleep apnea    uses Cpap   Stroke ([place])    Thyroid disease    Family History  Problem Relation Age of Onset   Prostate cancer Father    Rectal cancer Sister    Breast cancer Sister    Cancer Maternal Grandmother    Cancer Maternal Grandfather    Past Surgical History:  Procedure Laterality Date   bilateral wrist fractures  2010   LEFT HEART CATH AND CORONARY ANGIOGRAPHY N/A 03/27/2018   Procedure: LEFT HEART CATH AND CORONARY ANGIOGRAPHY;  Surgeon: Isaias Cowman, MD;  Location: [facility];  Service: Cardiovascular;  Laterality: N/A;   lt ankle fracture  2002   Patient Active Problem List   Diagnosis Date Noted   OSA on CPAP 04/21/2020   CPAP use counseling 04/21/2020   Need for hepatitis B screening test 11/15/2019   Hearing loss due to cerumen impaction, left 07/23/2019   Lymphedema 12/06/2018   IFG (impaired fasting glucose)  11/03/2018   Hallucinations, visual 07/03/2018   Unstable angina ([place]) 03/25/2018   Gout 05/09/2017   COPD (chronic obstructive pulmonary disease) ([place]) 04/10/2017   Sleep apnea 04/10/2017   History of pulmonary embolism 04/10/2017   History of thyroid disease 04/10/2017   Hyperlipidemia 12/31/2016   Essential hypertension 05/26/2016   Chronic diastolic heart failure ([place]) 05/26/2016   Hypokalemia 05/26/2016   Esophageal reflux 12/23/2008      Prior to Admission medications   Medication Sig Start Date End Date Taking? Authorizing Provider  allopurinol (ZYLOPRIM) 300 MG tablet TAKE 1 TABLET BY MOUTH EVERY DAY 06/26/20   Jon Billings, NP  amLODipine (NORVASC) 10 MG tablet TAKE 1 TABLET BY MOUTH EVERY DAY 06/26/20   Jon Billings, NP  aspirin EC 81 MG tablet Take 81 mg by mouth daily.    [provider]  atorvastatin (LIPITOR) 40 MG tablet Take 40 mg by mouth daily. 09/21/19   [provider]  cloNIDine (CATAPRES) 0.1 MG tablet TAKE ONE (1) TABLET BY MOUTH TWICE DAILY 07/09/20   Jon Billings, NP  colchicine 0.6 MG tablet TAKE ONE (1) TABLET BY MOUTH TWICE DAILY AS NEEDED FOR GOUT FLARES 12/28/19   Cannady, Henrine Screws T, NP  furosemide (LASIX) 40 MG tablet Take 1 tablet (40 mg total)  by mouth daily. 07/02/19 11/28/19  Volney American, PA-C  gabapentin (NEURONTIN) 300 MG capsule 300 mg qAM, 600 mg qhs 02/01/19   Gillis Santa, MD  hydrALAZINE (APRESOLINE) 50 MG tablet TAKE 1 TABLET BY MOUTH THREE TIMES DAILY 06/26/20   Jon Billings, NP  lisinopril (ZESTRIL) 40 MG tablet Take 1 tablet (40 mg total) by mouth daily. 07/02/19   Volney American, PA-C  metoprolol (TOPROL XL) 200 MG 24 hr tablet Take 1 tablet (200 mg total) by mouth daily. 07/02/19   Volney American, PA-C  pantoprazole (PROTONIX) 40 MG tablet TAKE 1 TABLET BY MOUTH EVERY DAY 05/29/20   [name], NP  potassium chloride SA (KLOR-CON) 20 MEQ tablet TAKE 1 TABLET BY MOUTH THREE TIMES DAILY  06/26/20   Jon Billings, NP  PROAIR HFA [phone] Base) MCG/ACT inhaler INHALE TWO (2) PUFFS BY MOUTH EVERY 6 HOURS AS NEEDED FOR WHEEZING OR FOR SHORTNESS OF BREATH 05/29/20   [name], NP  sertraline (ZOLOFT) 100 MG tablet Take 1 tablet (100 mg total) by mouth daily. 07/02/19   Volney American, PA-C  spironolactone (ALDACTONE) 25 MG tablet Take 1 tablet (25 mg total) by mouth daily. 11/28/19 02/26/20  Alisa Graff, FNP  tiotropium (SPIRIVA HANDIHALER) 18 MCG inhalation capsule Place 1 capsule (18 mcg total) into inhaler and inhale daily. 07/02/19   Volney American, PA-C    Allergies Morphine and related and Penicillins    Social History Social History   Tobacco Use   Smoking status: Former    Types: Cigarettes    Quit date: 01/26/2016    Years since quitting: 4.6   Smokeless tobacco: Never  Vaping Use   Vaping Use: Never used  Substance Use Topics   Alcohol use: Yes    Comment: Occassionally   Drug use: No    Review of Systems Patient denies headaches, rhinorrhea, blurry vision, numbness, shortness of breath, chest pain, edema, cough, abdominal pain, nausea, vomiting, diarrhea, dysuria, fevers, rashes or hallucinations unless otherwise stated above in HPI. ____________________________________________   PHYSICAL EXAM:  VITAL SIGNS: Vitals:   10/02/20 1653 10/02/20 1730  BP: (!) 136/102 (!) 135/97  Pulse: 100 94  Resp: 19 18  Temp:    SpO2: 100% 98%    Constitutional: Alert and oriented.  Eyes: Conjunctivae are normal.  Head: Atraumatic. Nose: No congestion/rhinnorhea. Mouth/Throat: Mucous membranes are moist.   Neck: No stridor. Painless ROM.  Cardiovascular: Normal rate, regular rhythm. Grossly normal heart sounds.  Good peripheral circulation. Respiratory: Normal respiratory effort.  No retractions. Lungs CTAB. Gastrointestinal: Soft and nontender. No distention. No abdominal bruits. left CVA tenderness. Genitourinary:  Musculoskeletal: No lower  extremity tenderness nor edema.  No joint effusions. Neurologic:  Normal speech and language. No gross focal neurologic deficits are appreciated. No facial droop Skin:  Skin is warm, dry and intact. No rash noted. Psychiatric: Mood and affect are normal. Speech and behavior are normal.  ____________________________________________   LABS (all labs ordered are listed, but only abnormal results are displayed)  Results for orders placed or performed during the hospital encounter of 10/02/20 (from the past 24 hour(s))  Lipase, blood     Status: None   Collection Time: 10/02/20  2:29 PM  Result Value Ref Range   Lipase 26 11 - 51 U/L  Comprehensive metabolic panel     Status: Abnormal   Collection Time: 10/02/20  2:29 PM  Result Value Ref Range   Sodium 139 135 - 145 mmol/L  Potassium 2.9 (L) 3.5 - 5.1 mmol/L   Chloride 102 98 - 111 mmol/L   CO2 26 22 - 32 mmol/L   Glucose, Bld 98 70 - 99 mg/dL   BUN 11 6 - 20 mg/dL   Creatinine, Ser 0.82 0.44 - 1.00 mg/dL   Calcium 8.8 (L) 8.9 - 10.3 mg/dL   Total Protein 6.9 6.5 - 8.1 g/dL   Albumin 3.6 3.5 - 5.0 g/dL   AST 19 15 - 41 U/L   ALT 14 0 - 44 U/L   Alkaline Phosphatase 51 38 - 126 U/L   Total Bilirubin 2.4 (H) 0.3 - 1.2 mg/dL   GFR, Estimated >60 >60 mL/min   Anion gap 11 5 - 15  CBC     Status: Abnormal   Collection Time: 10/02/20  2:29 PM  Result Value Ref Range   WBC 11.8 (H) 4.0 - 10.5 K/uL   RBC 4.96 3.87 - 5.11 MIL/uL   Hemoglobin 14.7 12.0 - 15.0 g/dL   HCT 45.4 36.0 - 46.0 %   MCV 91.5 80.0 - 100.0 fL   MCH 29.6 26.0 - 34.0 pg   MCHC 32.4 30.0 - 36.0 g/dL   RDW 14.7 11.5 - 15.5 %   Platelets 228 150 - 400 K/uL   nRBC 0.0 0.0 - 0.2 %  Magnesium     Status: None   Collection Time: 10/02/20  2:29 PM  Result Value Ref Range   Magnesium 1.7 1.7 - 2.4 mg/dL  Urinalysis, Complete w Microscopic     Status: Abnormal   Collection Time: 10/02/20  5:07 PM  Result Value Ref Range   Color, Urine YELLOW YELLOW   APPearance  CLOUDY (A) CLEAR   Specific Gravity, Urine 1.015 1.005 - 1.030   pH 5.5 5.0 - 8.0   Glucose, UA NEGATIVE NEGATIVE mg/dL   Hgb urine dipstick SMALL (A) NEGATIVE   Bilirubin Urine SMALL (A) NEGATIVE   Ketones, ur NEGATIVE NEGATIVE mg/dL   Protein, ur 30 (A) NEGATIVE mg/dL   Nitrite POSITIVE (A) NEGATIVE   Leukocytes,Ua LARGE (A) NEGATIVE   Squamous Epithelial / LPF 0-5 0 - 5   WBC, UA >50 0 - 5 WBC/hpf   RBC / HPF 11-20 0 - 5 RBC/hpf   Bacteria, UA MANY (A) NONE SEEN   Mucus PRESENT    WBC Casts, UA PRESENT   Resp Panel by RT-PCR (Flu A&B, Covid) Nasopharyngeal Swab     Status: None   Collection Time: 10/02/20  5:07 PM   Specimen: Nasopharyngeal Swab; Nasopharyngeal(NP) swabs in vial transport medium  Result Value Ref Range   SARS Coronavirus 2 by RT PCR NEGATIVE NEGATIVE   Influenza A by PCR NEGATIVE NEGATIVE   Influenza B by PCR NEGATIVE NEGATIVE   ____________________________________________  EKG My review and personal interpretation at Time: 17:02   Indication:  nausea Rate: 100  Rhythm: sinus Axis: normal Other: prolonged qt, nonspecific st abn ____________________________________________  RADIOLOGY  I personally reviewed all radiographic images ordered to evaluate for the above acute complaints and reviewed radiology reports and findings.  These findings were personally discussed with the patient.  Please see medical record for radiology report.  ____________________________________________   PROCEDURES  Procedure(s) performed:  Procedures    Critical Care performed: no ____________________________________________   INITIAL IMPRESSION / ASSESSMENT AND PLAN / ED COURSE  Pertinent labs & imaging results that were available during my care of the patient were reviewed by me and considered in my medical decision making (  see chart for details).   DDX: pyelo, enteritis, colitis, sbo, stone  LAURAL GEARTY is a 55 y.o. who presents to the ED with symptoms as  described above.  Patient is AFVSS in ED. Exam as above. Given current presentation have considered the above differential.  Mildly hypokalemic.  Will order ct abd for the above differential. The patient will be placed on continuous pulse oximetry and telemetry for monitoring.  Laboratory evaluation will be sent to evaluate for the above complaints.     Clinical Course as of 10/02/20 1903  Thu Oct 02, 2020  1805 Urinalysis consistent with UTI  [PR]  1824 CT imaging by my review appears consistent with kidney stone and concerning for infected kidney stone.    will consult urology. [PR]  1859 Have paged hospitalist.  I discussed case with Dr. Caprice Beaver of urology who plans to take patient to the OR for stent placement.  She remains hemodynamically stable. [PR]    Clinical Course User Index [PR] Merlyn Lot, MD    The patient was evaluated in Emergency Department today for the symptoms described in the history of present illness. He/she was evaluated in the context of the global COVID-19 pandemic, which necessitated consideration that the patient might be at risk for infection with the SARS-CoV-2 virus that causes COVID-19. Institutional protocols and algorithms that pertain to the evaluation of patients at risk for COVID-19 are in a state of rapid change based on information released by regulatory bodies including the CDC and federal and state organizations. These policies and algorithms were followed during the patient's care in the ED.  As part of my medical decision making, I reviewed the following data within the [facility] notes reviewed and incorporated, Labs reviewed, notes from prior ED visits and [place] Controlled Substance Database   ____________________________________________   FINAL CLINICAL IMPRESSION(S) / ED DIAGNOSES  Final diagnoses:  Pyelonephritis  Kidney stone      NEW MEDICATIONS STARTED DURING THIS VISIT:  New Prescriptions   No  medications on file     Note:  This document was prepared using Dragon voice recognition software and may include unintentional dictation errors.    Merlyn Lot, MD 10/02/20 [phone]

## 2020-10-02 NOTE — Op Note (Signed)
Date of procedure: 10/02/20  Preoperative diagnosis:  Left proximal ureteral stone UTI and sepsis from urinary source  Postoperative diagnosis:  Same  Procedure: Cystoscopy, left ureteral stent placement  Surgeon: Nickolas Madrid, MD  Anesthesia: General  Complications: None  Intraoperative findings:  Erythematous bladder consistent with infection, ureteral orifices orthotopic bilaterally Uncomplicated left ureteral stent placement with purulent urine  EBL: None  Specimens: None  Drains: Left 6 French by 26 cm ureteral stent  Indication: [name] is a 55 y.o. patient with left-sided flank pain, urinalysis concerning for infection, and signs of sepsis from urinary source with tachycardia, fever, and leukocytosis.  After reviewing the management options for treatment, they elected to proceed with the above surgical procedure(s). We have discussed the potential benefits and risks of the procedure, side effects of the proposed treatment, the likelihood of the patient achieving the goals of the procedure, and any potential problems that might occur during the procedure or recuperation. Informed consent has been obtained.  Description of procedure:  The patient was taken to the operating room and general anesthesia was induced. SCDs were placed for DVT prophylaxis. The patient was placed in the dorsal lithotomy position, prepped and draped in the usual sterile fashion, and preoperative antibiotics(ceftriaxone) were administered. A preoperative time-out was performed.   A 21 French rigid cystoscope was used to intubate the urethra and thorough cystoscopy was performed.  There was cystitis cystica consistent with acute infection, but no suspicious lesions, and the ureteral orifices were orthotopic bilaterally.  Fluoroscopy showed retained contrast in the left kidney showing moderate hydronephrosis behind the proximal ureteral stone.  A sensor wire was advanced through the scope and  passed easily alongside the stone into the dilated kidney.  A 6 French by 26 cm ureteral stent advanced easily over the wire with an excellent curl in the upper pole, as well as under direct vision the bladder.  There was purulent urine that drained through the side ports of the stent.  A 16 French Foley was placed to maximize drainage, and 10 mL were placed in the balloon.  Disposition: Stable to PACU  Plan: Admission for broad-spectrum antibiotics and resuscitation Foley can be removed when afebrile 24 hours and clinically improved Will need outpatient ureteroscopy in 2 to 3 weeks for stone removal once infection treated  Nickolas Madrid, MD

## 2020-10-03 ENCOUNTER — Encounter: Payer: Self-pay | Admitting: Family Medicine

## 2020-10-03 DIAGNOSIS — N201 Calculus of ureter: Secondary | ICD-10-CM | POA: Diagnosis not present

## 2020-10-03 DIAGNOSIS — N10 Acute tubulo-interstitial nephritis: Secondary | ICD-10-CM

## 2020-10-03 DIAGNOSIS — D72829 Elevated white blood cell count, unspecified: Secondary | ICD-10-CM

## 2020-10-03 DIAGNOSIS — A419 Sepsis, unspecified organism: Secondary | ICD-10-CM | POA: Diagnosis not present

## 2020-10-03 LAB — BASIC METABOLIC PANEL
Anion gap: 9 (ref 5–15)
BUN: 15 mg/dL (ref 6–20)
CO2: 28 mmol/L (ref 22–32)
Calcium: 8.9 mg/dL (ref 8.9–10.3)
Chloride: 102 mmol/L (ref 98–111)
Creatinine, Ser: 0.74 mg/dL (ref 0.44–1.00)
GFR, Estimated: >60 mL/min (ref >60)
Glucose, Bld: 125 mg/dL — ABNORMAL HIGH (ref 70–99)
Potassium: 4 mmol/L (ref 3.5–5.1)
Sodium: 139 mmol/L (ref 135–145)

## 2020-10-03 LAB — CBC
HCT: 46.6 % — ABNORMAL HIGH (ref 36.0–46.0)
Hemoglobin: 15.2 g/dL — ABNORMAL HIGH (ref 12.0–15.0)
MCH: 30.7 pg (ref 26.0–34.0)
MCHC: 32.6 g/dL (ref 30.0–36.0)
MCV: 94.1 fL (ref 80.0–100.0)
Platelets: 227 10*3/uL (ref 150–400)
RBC: 4.95 MIL/uL (ref 3.87–5.11)
RDW: 14.6 % (ref 11.5–15.5)
WBC: 12.5 10*3/uL — ABNORMAL HIGH (ref 4.0–10.5)
nRBC: 0 % (ref 0.0–0.2)

## 2020-10-03 LAB — SURGICAL PCR SCREEN
MRSA, PCR: NEGATIVE
Staphylococcus aureus: NEGATIVE

## 2020-10-03 LAB — HIV ANTIBODY (ROUTINE TESTING W REFLEX): HIV Screen 4th Generation wRfx: NONREACTIVE

## 2020-10-03 MED ORDER — CLONIDINE HCL 0.1 MG PO TABS: 0.1000 mg | ORAL_TABLET | Freq: Three times a day (TID) | ORAL | Status: DC

## 2020-10-03 MED ORDER — MUPIROCIN 2 % EX OINT
1.0000 "application " | TOPICAL_OINTMENT | Freq: Two times a day (BID) | CUTANEOUS | Status: DC
  Administered 2020-10-03 – 2020-10-04 (×3): 1 via NASAL
  Filled 2020-10-03: qty 22

## 2020-10-03 MED ORDER — OXYBUTYNIN CHLORIDE 5 MG PO TABS
5.0000 mg | ORAL_TABLET | Freq: Three times a day (TID) | ORAL | Status: DC
  Administered 2020-10-03 – 2020-10-04 (×3): 5 mg via ORAL
  Filled 2020-10-03 (×3): qty 1

## 2020-10-03 MED ORDER — NYSTATIN 100000 UNIT/GM EX POWD
Freq: Two times a day (BID) | CUTANEOUS | Status: DC
  Filled 2020-10-03: qty 15

## 2020-10-03 MED ORDER — CHLORHEXIDINE GLUCONATE CLOTH 2 % EX PADS
6.0000 | MEDICATED_PAD | Freq: Every day | CUTANEOUS | Status: DC
  Administered 2020-10-04: 6 via TOPICAL

## 2020-10-03 MED ORDER — HYDRALAZINE HCL 50 MG PO TABS: 50.0000 mg | ORAL_TABLET | Freq: Three times a day (TID) | ORAL | Status: DC

## 2020-10-03 MED ORDER — TAMSULOSIN HCL 0.4 MG PO CAPS
0.4000 mg | ORAL_CAPSULE | Freq: Every day | ORAL | Status: DC
  Administered 2020-10-03: 0.4 mg via ORAL
  Filled 2020-10-03: qty 1

## 2020-10-03 NOTE — Progress Notes (Signed)
Urology Inpatient Progress Note  Subjective: No acute events overnight.  She is afebrile, VSS. A.m. labs pending.  Urine culture pending, blood cultures pending with no growth at <12 hours. On antibiotics as below. Foley catheter in place draining clear, yellow urine. Patient reports overall feeling better compared to preop.  She is having some mild left-sided abdominal discomfort and occasional sensations of urinary urgency today.  She states she would prefer to remain in the hospital until tomorrow.  Anti-infectives: Anti-infectives (From admission, onward)    Start     Dose/Rate Route Frequency Ordered Stop   10/03/20 2000  cefTRIAXone (ROCEPHIN) 2 g in sodium chloride 0.9 % 100 mL IVPB        2 g 200 mL/hr over 30 Minutes Intravenous Every 24 hours 10/02/20 1949     10/02/20 2200  cefTRIAXone (ROCEPHIN) 1 g in sodium chloride 0.9 % 100 mL IVPB        1 g 200 mL/hr over 30 Minutes Intravenous  Once 10/02/20 2007 10/03/20 0014   10/02/20 1800  cefTRIAXone (ROCEPHIN) 1 g in sodium chloride 0.9 % 100 mL IVPB        1 g 200 mL/hr over 30 Minutes Intravenous  Once 10/02/20 1759 10/02/20 2351       Current Facility-Administered Medications  Medication Dose Route Frequency Provider Last Rate Last Admin   0.9 %  sodium chloride infusion   Intravenous Continuous Mansy, Jan A, MD 100 mL/hr at 10/02/20 2343 New Bag at 10/02/20 2343   acetaminophen (TYLENOL) tablet 650 mg  650 mg Oral Q6H PRN Mansy, Jan A, MD       Or   acetaminophen (TYLENOL) suppository 650 mg  650 mg Rectal Q6H PRN Mansy, Jan A, MD       albuterol (PROVENTIL) (2.5 MG/3ML) 0.083% nebulizer solution 2.5 mg  2.5 mg Nebulization Q4H PRN Renda Rolls, RPH       allopurinol (ZYLOPRIM) tablet 300 mg  300 mg Oral Daily Mansy, Jan A, MD       amLODipine (NORVASC) tablet 10 mg  10 mg Oral Daily Mansy, Jan A, MD       atorvastatin (LIPITOR) tablet 40 mg  40 mg Oral Daily Mansy, Jan A, MD       cefTRIAXone (ROCEPHIN) 2 g in  sodium chloride 0.9 % 100 mL IVPB  2 g Intravenous Q24H Mansy, Jan A, MD       Chlorhexidine Gluconate Cloth 2 % PADS 6 each  6 each Topical Daily Mansy, Jan A, MD       cloNIDine (CATAPRES) tablet 0.1 mg  0.1 mg Oral TID Aline August, MD       colchicine tablet 0.6 mg  0.6 mg Oral BID PRN Mansy, Jan A, MD       enoxaparin (LOVENOX) injection 50 mg  0.5 mg/kg Subcutaneous Q24H Mansy, Jan A, MD       furosemide (LASIX) tablet 40 mg  40 mg Oral Daily Mansy, Jan A, MD       gabapentin (NEURONTIN) capsule 300 mg  300 mg Oral q morning Renda Rolls, RPH       And   gabapentin (NEURONTIN) capsule 600 mg  600 mg Oral QHS Renda Rolls, RPH   600 mg at 10/02/20 2349   hydrALAZINE (APRESOLINE) tablet 50 mg  50 mg Oral TID Aline August, MD       lisinopril (ZESTRIL) tablet 40 mg  40 mg Oral Daily Mansy, Jan A,  MD       magnesium hydroxide (MILK OF MAGNESIA) suspension 30 mL  30 mL Oral Daily PRN Mansy, Jan A, MD       magnesium sulfate IVPB 2 g 50 mL  2 g Intravenous Once Mansy, Jan A, MD       metoprolol succinate (TOPROL-XL) 24 hr tablet 200 mg  200 mg Oral Daily Mansy, Jan A, MD       ondansetron [facility]) tablet 4 mg  4 mg Oral Q6H PRN Mansy, Jan A, MD       Or   ondansetron [facility]) injection 4 mg  4 mg Intravenous Q6H PRN Mansy, Jan A, MD       pantoprazole (PROTONIX) EC tablet 40 mg  40 mg Oral Daily Mansy, Jan A, MD       potassium chloride SA (KLOR-CON) CR tablet 20 mEq  20 mEq Oral TID Mansy, Jan A, MD   20 mEq at 10/02/20 2350   sertraline (ZOLOFT) tablet 100 mg  100 mg Oral Daily Mansy, Jan A, MD       spironolactone (ALDACTONE) tablet 25 mg  25 mg Oral Daily Mansy, Jan A, MD       tiotropium [facility]) inhalation capsule [facility] use ONLY) 18 mcg  18 mcg Inhalation Daily Mansy, Jan A, MD       traZODone (DESYREL) tablet 25 mg  25 mg Oral QHS PRN Mansy, Jan A, MD       Objective: Vital signs in last 24 hours: Temp:  [97.4 F (36.3 C)-99.8 F (37.7 C)] 97.4 F (36.3 C) (09/09  0742) Pulse Rate:  [70-110] 70 (09/09 0742) Resp:  [13-26] 20 (09/09 0742) BP: (101-136)/(71-102) 109/84 (09/09 0742) SpO2:  [95 %-100 %] 98 % (09/09 0742) Weight:  [101.6 kg] 101.6 kg (09/08 1427)  Intake/Output from previous day: 09/08 0701 - 09/09 0700 In: 200 [I.V.:200] Out: 300 [Urine:300] Intake/Output this shift: No intake/output data recorded.  Physical Exam Vitals and nursing note reviewed.  Constitutional:      General: She is not in acute distress.    Appearance: She is not ill-appearing, toxic-appearing or diaphoretic.  HENT:     Head: Normocephalic and atraumatic.  Pulmonary:     Effort: Pulmonary effort is normal. No respiratory distress.  Skin:    General: Skin is warm and dry.  Neurological:     Mental Status: She is alert and oriented to person, place, and time.  Psychiatric:        Mood and Affect: Mood normal.        Behavior: Behavior normal.   Lab Results:  Recent Labs    10/02/20 1429  WBC 11.8*  HGB 14.7  HCT 45.4  PLT 228   BMET Recent Labs    10/02/20 1429  NA 139  K 2.9*  CL 102  CO2 26  GLUCOSE 98  BUN 11  CREATININE 0.82  CALCIUM 8.8*   Assessment & Plan: 55 year old female admitted with a 5 mm proximal left ureteral stone and urosepsis, now POD 1 from left ureteral stent placement with Dr. Diamantina Providence.  A.m. labs are pending, however her vitals are stable and she is well-appearing on exam today.  She appears to be having some mild stent related symptoms today.  Recommend starting Flomax 0.4 mg daily and oxybutynin 5 mg Q8 as needed for management of stent discomfort.  I do suspect her bladder spasms may improve once Foley catheter has been removed.  I discussed with the patient  that she will require outpatient ureteroscopy with laser lithotripsy and stent exchange in 2 to 3 weeks for definitive management of her stone.  In the meantime, we discussed normal stent symptoms including flank pain, bladder pain, dysuria, urgency, frequency,  and gross hematuria.  Patient expressed understanding, all questions answered.  Recommendations: -Continue empiric antibiotics and follow urine cultures.  She will require a total of 10 to 14 days of culture appropriate therapy. -Once a.m. labs result, if creatinine is downtrending and leukocytosis is improving, okay to discontinue Foley catheter -Start Flomax and oxybutynin as above -Outpatient ureteroscopy with laser lithotripsy and stent exchange with Dr. Diamantina Providence in 2 to 3 weeks  Debroah Loop, PA-C 10/03/2020

## 2020-10-03 NOTE — Plan of Care (Signed)

## 2020-10-03 NOTE — Progress Notes (Signed)
Patient ID: [name], female   DOB: 1965-05-23, 55 y.o.   MRN: [id number]  PROGRESS NOTE    [name]  [id number] DOB: 1965-10-05 DOA: 10/02/2020 PCP: Jon Billings, NP   Brief Narrative:  55 year old female with history of atrial fibrillation, chronic diastolic CHF, COPD, GERD, hypertension, unspecified CVA and hypothyroidism presented with left-sided flank pain with nausea and vomiting and increased urinary frequency and urgency.  On presentation, she was tachycardic, tachypneic with evidence of UTI.  CT of abdomen and pelvis showed left hydroureteronephrosis with proximal ureteric stone.  She was started on IV fluids and antibiotics.  Urology was consulted.  She underwent cystoscopy and left ureteral stent placement on 10/02/2020.  Assessment & Plan:   Sepsis: Present on admission Acute pyelonephritis Acute left obstructive uropathy due to left ureteral stone and left hydroureteronephrosis -Patient presented with tachycardia, tachypnea with leukocytosis and with evidence of UTI -Underwent cystoscopy and left renal stent placement by urology.  Follow urine cultures and blood cultures.  Follow further urology recommendations. -Continue Rocephin  Leukocytosis -Monitor  Hypokalemia -Resolved  Essential hypertension -Blood pressure on the lower side currently.  Hold all antihypertensives for today.  Monitor  Dyslipidemia --Continue statin  Gout -Continue allopurinol and colchicine  Depression -Continue Zoloft  COPD -Continue ProAir and Spiriva  Chronic compensated diastolic CHF -Currently compensated.  Strict input and output.  Daily weights.  Hold all antihypertensives for today.  Paroxysmal A. fib -Currently rate controlled.  Not on anticoagulation as an outpatient.  Hold Toprol-XL for today  OSA -Continue CPAP nightly  DVT prophylaxis: Lovenox Code Status: Full Family Communication: None at bedside Disposition Plan: Status is: Inpatient  Remains  inpatient appropriate because:Inpatient level of care appropriate due to severity of illness  Dispo: The patient is from: Home              Anticipated d/c is to: Home              Patient currently is not medically stable to d/c.   Difficult to place patient No  Consultants: Urology  Procedures: Cystoscopy and left ureteral stent placement on 10/02/2020  Antimicrobials: Rocephin from 10/02/2020 onwards   Subjective: Patient seen and examined at bedside.  Denies worsening fever, nausea or vomiting.  Feels slightly better.  Objective: Vitals:   10/02/20 2340 10/02/20 2340 10/03/20 0502 10/03/20 0742  BP: (!) 134/102 (!) 134/102 105/84 109/84  Pulse: 80 80 72 70  Resp:   18 20  Temp: 98.3 F (36.8 C) 98.3 F (36.8 C) 98.3 F (36.8 C) (!) 97.4 F (36.3 C)  TempSrc: Oral Oral  Oral  SpO2: 96% 96% 99% 98%  Weight:      Height:        Intake/Output Summary (Last 24 hours) at 10/03/2020 1303 Last data filed at 10/03/2020 0600 Gross per 24 hour  Intake 200 ml  Output 300 ml  Net -100 ml   Filed Weights   10/02/20 1427  Weight: 101.6 kg    Examination:  General exam: Appears calm and comfortable.  Currently on room air. Respiratory system: Bilateral decreased breath sounds at bases Cardiovascular system: S1 & S2 heard, Rate controlled Gastrointestinal system: Abdomen is nondistended, soft and nontender. Normal bowel sounds heard. Extremities: No cyanosis, clubbing, edema  Central nervous system: Alert and oriented. No focal neurological deficits. Moving extremities Skin: No rashes, lesions or ulcers Psychiatry: Judgement and insight appear normal. Mood & affect appropriate.     Data Reviewed: I have personally  reviewed following labs and imaging studies  CBC: Recent Labs  Lab 10/02/20 1429 10/03/20 0841  WBC 11.8* 12.5*  HGB 14.7 15.2*  HCT 45.4 46.6*  MCV 91.5 94.1  PLT 228 [id number]   Basic Metabolic Panel: Recent Labs  Lab 10/02/20 1429 10/03/20 0841  NA 139  139  K 2.9* 4.0  CL 102 102  CO2 26 28  GLUCOSE 98 125*  BUN 11 15  CREATININE 0.82 0.74  CALCIUM 8.8* 8.9  MG 1.7  --    GFR: Estimated Creatinine Clearance: 102 mL/min (by C-G formula based on SCr of 0.74 mg/dL). Liver Function Tests: Recent Labs  Lab 10/02/20 1429  AST 19  ALT 14  ALKPHOS 51  BILITOT 2.4*  PROT 6.9  ALBUMIN 3.6   Recent Labs  Lab 10/02/20 1429  LIPASE 26   No results for input(s): AMMONIA in the last 168 hours. Coagulation Profile: No results for input(s): INR, PROTIME in the last 168 hours. Cardiac Enzymes: No results for input(s): CKTOTAL, CKMB, CKMBINDEX, TROPONINI in the last 168 hours. BNP (last 3 results) No results for input(s): PROBNP in the last 8760 hours. HbA1C: No results for input(s): HGBA1C in the last 72 hours. CBG: No results for input(s): GLUCAP in the last 168 hours. Lipid Profile: No results for input(s): CHOL, HDL, LDLCALC, TRIG, CHOLHDL, LDLDIRECT in the last 72 hours. Thyroid Function Tests: No results for input(s): TSH, T4TOTAL, FREET4, T3FREE, THYROIDAB in the last 72 hours. Anemia Panel: No results for input(s): VITAMINB12, FOLATE, FERRITIN, TIBC, IRON, RETICCTPCT in the last 72 hours. Sepsis Labs: Recent Labs  Lab 10/02/20 2317  LATICACIDVEN 0.7    Recent Results (from the past 240 hour(s))  Resp Panel by RT-PCR (Flu A&B, Covid) Nasopharyngeal Swab     Status: None   Collection Time: 10/02/20  5:07 PM   Specimen: Nasopharyngeal Swab; Nasopharyngeal(NP) swabs in vial transport medium  Result Value Ref Range Status   SARS Coronavirus 2 by RT PCR NEGATIVE NEGATIVE Final    Comment: (NOTE) SARS-CoV-2 target nucleic acids are NOT DETECTED.  The SARS-CoV-2 RNA is generally detectable in upper respiratory specimens during the acute phase of infection. The lowest concentration of SARS-CoV-2 viral copies this assay can detect is 138 copies/mL. A negative result does not preclude SARS-Cov-2 infection and should not  be used as the sole basis for treatment or other patient management decisions. A negative result may occur with  improper specimen collection/handling, submission of specimen other than nasopharyngeal swab, presence of viral mutation(s) within the areas targeted by this assay, and inadequate number of viral copies(<138 copies/mL). A negative result must be combined with clinical observations, patient history, and epidemiological information. The expected result is Negative.  Fact Sheet for Patients:  EntrepreneurPulse.com.au  Fact Sheet for Healthcare Providers:  IncredibleEmployment.be  This test is no t yet approved or cleared by the [place] FDA and  has been authorized for detection and/or diagnosis of SARS-CoV-2 by FDA under an Emergency Use Authorization (EUA). This EUA will remain  in effect (meaning this test can be used) for the duration of the COVID-19 declaration under Section 564(b)(1) of the Act, 21 U.S.C.section 360bbb-3(b)(1), unless the authorization is terminated  or revoked sooner.       Influenza A by PCR NEGATIVE NEGATIVE Final   Influenza B by PCR NEGATIVE NEGATIVE Final    Comment: (NOTE) The Xpert Xpress SARS-CoV-2/FLU/RSV plus assay is intended as an aid in the diagnosis of influenza from Nasopharyngeal swab specimens and  should not be used as a sole basis for treatment. Nasal washings and aspirates are unacceptable for Xpert Xpress SARS-CoV-2/FLU/RSV testing.  Fact Sheet for Patients: EntrepreneurPulse.com.au  Fact Sheet for Healthcare Providers: IncredibleEmployment.be  This test is not yet approved or cleared by the [place] FDA and has been authorized for detection and/or diagnosis of SARS-CoV-2 by FDA under an Emergency Use Authorization (EUA). This EUA will remain in effect (meaning this test can be used) for the duration of the COVID-19 declaration under Section  564(b)(1) of the Act, 21 U.S.C. section 360bbb-3(b)(1), unless the authorization is terminated or revoked.  Performed at [facility], [address] [place], [place] [address]   CULTURE, BLOOD (ROUTINE X 2) w Reflex to ID Panel     Status: None (Preliminary result)   Collection Time: 10/02/20 11:17 PM   Specimen: BLOOD  Result Value Ref Range Status   Specimen Description BLOOD LEFT FOREARM  Final   Special Requests   Final    BOTTLES DRAWN AEROBIC AND ANAEROBIC Blood Culture results may not be optimal due to an inadequate volume of blood received in culture bottles   Culture   Final    NO GROWTH < 12 HOURS Performed at [facility], [address] [place], [place] [address]    Report Status PENDING  Incomplete  CULTURE, BLOOD (ROUTINE X 2) w Reflex to ID Panel     Status: None (Preliminary result)   Collection Time: 10/02/20 11:17 PM   Specimen: BLOOD  Result Value Ref Range Status   Specimen Description BLOOD LEFT ASSIST CONTROL  Final   Special Requests   Final    BOTTLES DRAWN AEROBIC ONLY Blood Culture adequate volume   Culture   Final    NO GROWTH < 12 HOURS Performed at [facility], [address], [place], [place] [address]    Report Status PENDING  Incomplete         Radiology Studies: CT ABDOMEN PELVIS W CONTRAST  Result Date: 10/02/2020 CLINICAL DATA:  Left lower quadrant abdominal pain. EXAM: CT ABDOMEN AND PELVIS WITH CONTRAST TECHNIQUE: Multidetector CT imaging of the abdomen and pelvis was performed using the standard protocol following bolus administration of intravenous contrast. CONTRAST:  36m OMNIPAQUE IOHEXOL 350 MG/ML SOLN COMPARISON:  Multiple priors including CT February 20, 2020 and December 04, 2019 FINDINGS: Lower chest: Hypoventilatory changes in lung bases. Tortuous thoracic aorta. Hepatobiliary: Diffuse hepatic steatosis. Gallbladder is unremarkable. No biliary ductal dilation. Pancreas: No evidence of acute  inflammation or pancreatic ductal dilation. Spleen: Within normal limits. Adrenals/Urinary Tract: 2.9 cm left adrenal lesion is stable since January 2014 and most consistent with a benign adrenal adenoma. Right adrenal glands unremarkable. No right-sided hydronephrosis. Punctate 1 mm right interpolar renal stone. Subcentimeter hypodense right renal lesions too small to accurately characterize but statistically likely represent cysts. Left hydroureteronephrosis to the level of a 5 mm stone in the proximal ureter. Kidney is edematous with perinephric and periureteric inflammatory stranding. Hypodense 1.2 cm left upper pole renal lesion which is stable dating back to April 12, 2017 with Hounsfield units of water density consistent with a cyst. Additional subcentimeter left renal lesions are too small to accurately characterize but statistically likely represent cysts. Mild wall thickening of an incompletely distended urinary bladder. Stomach/Bowel: Small hiatal hernia otherwise stomach is unremarkable for degree of distension. No pathologic dilation of small or large bowel. The appendix and terminal ileum appear normal. Colonic diverticulosis without findings of acute diverticulitis. Vascular/Lymphatic: Minimal  aortic atherosclerosis without aneurysmal dilation. No pathologically enlarged abdominal or pelvic lymph nodes. Reproductive: Uterus and bilateral adnexa are unremarkable. Other: No walled off fluid collections.  No pneumoperitoneum. Musculoskeletal: Remote bilateral lower rib fractures. Remote bilateral inferior pubic rami fractures. Remote parasymphyseal superior pubic ramus fracture. Multilevel degenerative changes spine. No acute osseous abnormality. IMPRESSION: 1. Left hydroureteronephrosis to the level of a 5 mm stone in the proximal ureter, with an edematous left kidney and periureteric/perinephric stranding recommend correlation with laboratory values to exclude superimposed infection. 2. Punctate 1 mm  nonobstructing right renal stone. 3. Mild wall thickening of an incompletely distended urinary bladder. Correlate with urinalysis to exclude cystitis. 4. Colonic diverticulosis without findings of acute diverticulitis. 5. Diffuse hepatic steatosis. 6. Stable 2.9 cm left adrenal adenoma. Electronically Signed   By: Dahlia Bailiff M.D.   On: 10/02/2020 18:36   DG Chest Portable 1 View  Result Date: 10/02/2020 CLINICAL DATA:  Shortness of breath, fever. EXAM: PORTABLE CHEST 1 VIEW COMPARISON:  May 29, 2020. FINDINGS: Stable cardiomegaly. No pneumothorax or pleural effusion is noted. Old left rib fractures are noted. Mild bibasilar subsegmental atelectasis or infiltrates are noted. IMPRESSION: Mild bibasilar subsegmental atelectasis or infiltrates are noted. Electronically Signed   By: Marijo Conception M.D.   On: 10/02/2020 17:37   DG OR UROLOGY CYSTO IMAGE ([facility] ONLY)  Result Date: 10/02/2020 There is no interpretation for this exam.  This order is for images obtained during a surgical procedure.  Please See "Surgeries" Tab for more information regarding the procedure.        Scheduled Meds:  allopurinol  300 mg Oral Daily   amLODipine  10 mg Oral Daily   atorvastatin  40 mg Oral Daily   Chlorhexidine Gluconate Cloth  6 each Topical Daily   cloNIDine  0.1 mg Oral TID   enoxaparin (LOVENOX) injection  0.5 mg/kg Subcutaneous Q24H   furosemide  40 mg Oral Daily   gabapentin  300 mg Oral q morning   And   gabapentin  600 mg Oral QHS   hydrALAZINE  50 mg Oral TID   lisinopril  40 mg Oral Daily   metoprolol  200 mg Oral Daily   mupirocin ointment  1 application Nasal BID   nystatin   Topical BID   pantoprazole  40 mg Oral Daily   potassium chloride SA  20 mEq Oral TID   sertraline  100 mg Oral Daily   spironolactone  25 mg Oral Daily   tiotropium  18 mcg Inhalation Daily   Continuous Infusions:  sodium chloride 100 mL/hr at 10/03/20 0906   cefTRIAXone (ROCEPHIN)  IV     magnesium sulfate  bolus IVPB            Aline August, MD [facility] 10/03/2020, 1:03 PM

## 2020-10-04 LAB — CBC WITH DIFFERENTIAL/PLATELET
Abs Immature Granulocytes: 0.03 10*3/uL (ref 0.00–0.07)
Basophils Absolute: 0 10*3/uL (ref 0.0–0.1)
Basophils Relative: 0 %
Eosinophils Absolute: 0.1 10*3/uL (ref 0.0–0.5)
Eosinophils Relative: 1 %
HCT: 41.6 % (ref 36.0–46.0)
Hemoglobin: 13.4 g/dL (ref 12.0–15.0)
Immature Granulocytes: 0 %
Lymphocytes Relative: 16 %
Lymphs Abs: 1.5 10*3/uL (ref 0.7–4.0)
MCH: 30.2 pg (ref 26.0–34.0)
MCHC: 32.2 g/dL (ref 30.0–36.0)
MCV: 93.7 fL (ref 80.0–100.0)
Monocytes Absolute: 0.8 10*3/uL (ref 0.1–1.0)
Monocytes Relative: 9 %
Neutro Abs: 7 10*3/uL (ref 1.7–7.7)
Neutrophils Relative %: 74 %
Platelets: 220 10*3/uL (ref 150–400)
RBC: 4.44 MIL/uL (ref 3.87–5.11)
RDW: 14.5 % (ref 11.5–15.5)
WBC: 9.5 10*3/uL (ref 4.0–10.5)
nRBC: 0 % (ref 0.0–0.2)

## 2020-10-04 LAB — BASIC METABOLIC PANEL
Anion gap: 5 (ref 5–15)
BUN: 15 mg/dL (ref 6–20)
CO2: 28 mmol/L (ref 22–32)
Calcium: 8.3 mg/dL — ABNORMAL LOW (ref 8.9–10.3)
Chloride: 106 mmol/L (ref 98–111)
Creatinine, Ser: 0.85 mg/dL (ref 0.44–1.00)
GFR, Estimated: >60 mL/min (ref >60)
Glucose, Bld: 100 mg/dL — ABNORMAL HIGH (ref 70–99)
Potassium: 3.5 mmol/L (ref 3.5–5.1)
Sodium: 139 mmol/L (ref 135–145)

## 2020-10-04 LAB — MAGNESIUM: Magnesium: 1.8 mg/dL (ref 1.7–2.4)

## 2020-10-04 MED ORDER — OXYBUTYNIN CHLORIDE 5 MG PO TABS: 5.0000 mg | ORAL_TABLET | Freq: Three times a day (TID) | ORAL | 0 refills | Status: DC

## 2020-10-04 MED ORDER — CEPHALEXIN 500 MG PO CAPS: 500.0000 mg | ORAL_CAPSULE | Freq: Four times a day (QID) | ORAL | 0 refills | Status: AC

## 2020-10-04 MED ORDER — TAMSULOSIN HCL 0.4 MG PO CAPS: 0.4000 mg | ORAL_CAPSULE | Freq: Every day | ORAL | 0 refills | Status: DC

## 2020-10-04 NOTE — Discharge Summary (Signed)
Physician Discharge Summary  [name] [id number] DOB: 10/15/1965 DOA: 10/02/2020  PCP: Jon Billings, NP  Admit date: 10/02/2020 Discharge date: 10/04/2020  Admitted From: Home Disposition: Home  Recommendations for Outpatient Follow-up:  Follow up with PCP in 1 week with repeat CBC/BMP Outpatient followup with urology Follow up in ED if symptoms worsen or new appear   Home Health: No Equipment/Devices: None  Discharge Condition: Stable CODE STATUS: Full Diet recommendation: Heart healthy  Brief/Interim Summary: 55 year old female with history of atrial fibrillation, chronic diastolic CHF, COPD, GERD, hypertension, unspecified CVA and hypothyroidism presented with left-sided flank pain with nausea and vomiting and increased urinary frequency and urgency.  On presentation, she was tachycardic, tachypneic with evidence of UTI.  CT of abdomen and pelvis showed left hydroureteronephrosis with proximal ureteric stone.  She was started on IV fluids and antibiotics.  Urology was consulted.  She underwent cystoscopy and left ureteral stent placement on 10/02/2020.  Subsequently, her condition has remained stable.  She will be discharged home today on oral Keflex with outpatient follow-up with urology.    Discharge Diagnoses:   Sepsis: Present on admission Acute pyelonephritis Acute left obstructive uropathy due to left ureteral stone and left hydroureteronephrosis -Patient presented with tachycardia, tachypnea with leukocytosis and with evidence of UTI -Underwent cystoscopy and left renal stent placement by urology.   -Hemodynamically much improved.  Cultures have remained negative so far.  DC IV fluids.  Tolerating diet.  No vomiting or worsening abdominal pain. -DC Foley catheter today.  Urology has cleared the patient for discharge.  Discharge on oral Keflex for 10 more days.  Outpatient follow-up with urology.   Leukocytosis -Resolved  Hypokalemia -Resolved  Essential  hypertension -Blood pressure still on the lower side currently.  Hold all antihypertensives till reevaluated by PCP.  Dyslipidemia --Continue statin  Gout -Continue allopurinol and colchicine  Depression -Continue Zoloft  COPD -Continue home regimen.  Chronic compensated diastolic CHF -Currently compensated.  Hold all antihypertensives till reevaluation by PCP.  Continue diet and fluid restriction at home.    Paroxysmal A. fib -Currently rate controlled.  Not on anticoagulation as an outpatient.  Hold Toprol-XL till reevaluation by PCP.   OSA -Continue CPAP nightly   Discharge Instructions  Discharge Instructions     Diet - low sodium heart healthy   Complete by: As directed    Increase activity slowly   Complete by: As directed       Allergies as of 10/04/2020       Reactions   Morphine And Related Hives   Penicillins Other (See Comments)   Painful urination Has patient had a PCN reaction causing immediate rash, facial/tongue/throat swelling, SOB or lightheadedness with hypotension: yes Has patient had a PCN reaction causing severe rash involving mucus membranes or skin necrosis: no Has patient had a PCN reaction that required hospitalization no Has patient had a PCN reaction occurring within the last 10 years: no If all of the above answers are "NO", then may proceed with Cephalosporin use.        Medication List     STOP taking these medications    amLODipine 10 MG tablet Commonly known as: NORVASC   cloNIDine 0.1 MG tablet Commonly known as: CATAPRES   furosemide 40 MG tablet Commonly known as: LASIX   hydrALAZINE 50 MG tablet Commonly known as: APRESOLINE   lisinopril 40 MG tablet Commonly known as: ZESTRIL   metoprolol 200 MG 24 hr tablet Commonly known as: Toprol XL  potassium chloride SA 20 MEQ tablet Commonly known as: KLOR-CON   spironolactone 25 MG tablet Commonly known as: ALDACTONE       TAKE these medications     allopurinol 300 MG tablet Commonly known as: ZYLOPRIM TAKE 1 TABLET BY MOUTH EVERY DAY   aspirin EC 81 MG tablet Take 81 mg by mouth daily.   atorvastatin 40 MG tablet Commonly known as: LIPITOR Take 40 mg by mouth daily.   cephALEXin 500 MG capsule Commonly known as: KEFLEX Take 1 capsule (500 mg total) by mouth 4 (four) times daily for 10 days.   colchicine 0.6 MG tablet TAKE ONE (1) TABLET BY MOUTH TWICE DAILY AS NEEDED FOR GOUT FLARES   gabapentin 300 MG capsule Commonly known as: NEURONTIN 300 mg qAM, 600 mg qhs   oxybutynin 5 MG tablet Commonly known as: DITROPAN Take 1 tablet (5 mg total) by mouth 3 (three) times daily.   pantoprazole 40 MG tablet Commonly known as: PROTONIX TAKE 1 TABLET BY MOUTH EVERY DAY   ProAir HFA 108 (90 Base) MCG/ACT inhaler Generic drug: albuterol INHALE TWO (2) PUFFS BY MOUTH EVERY 6 HOURS AS NEEDED FOR WHEEZING OR FOR SHORTNESS OF BREATH   sertraline 100 MG tablet Commonly known as: ZOLOFT Take 1 tablet (100 mg total) by mouth daily.   Spiriva HandiHaler 18 MCG inhalation capsule Generic drug: tiotropium Place 1 capsule (18 mcg total) into inhaler and inhale daily.   tamsulosin 0.4 MG Caps capsule Commonly known as: FLOMAX Take 1 capsule (0.4 mg total) by mouth daily after supper.          Follow-up Information     Jon Billings, NP. Schedule an appointment as soon as possible for a visit in 1 week(s).   Specialty: Nurse Practitioner Why: with repeat cbc/bmp Contact information: [place] [address] [phone]         Billey Co, MD. Schedule an appointment as soon as possible for a visit in 1 week(s).   Specialty: Urology Contact information: [place] [address] [phone]                Allergies  Allergen Reactions   Morphine And Related Hives   Penicillins Other (See Comments)    Painful urination Has patient had a PCN reaction causing  immediate rash, facial/tongue/throat swelling, SOB or lightheadedness with hypotension: yes Has patient had a PCN reaction causing severe rash involving mucus membranes or skin necrosis: no Has patient had a PCN reaction that required hospitalization no Has patient had a PCN reaction occurring within the last 10 years: no If all of the above answers are "NO", then may proceed with Cephalosporin use.     Consultations: Urology   Procedures/Studies: CT ABDOMEN PELVIS W CONTRAST  Result Date: 10/02/2020 CLINICAL DATA:  Left lower quadrant abdominal pain. EXAM: CT ABDOMEN AND PELVIS WITH CONTRAST TECHNIQUE: Multidetector CT imaging of the abdomen and pelvis was performed using the standard protocol following bolus administration of intravenous contrast. CONTRAST:  7m OMNIPAQUE IOHEXOL 350 MG/ML SOLN COMPARISON:  Multiple priors including CT February 20, 2020 and December 04, 2019 FINDINGS: Lower chest: Hypoventilatory changes in lung bases. Tortuous thoracic aorta. Hepatobiliary: Diffuse hepatic steatosis. Gallbladder is unremarkable. No biliary ductal dilation. Pancreas: No evidence of acute inflammation or pancreatic ductal dilation. Spleen: Within normal limits. Adrenals/Urinary Tract: 2.9 cm left adrenal lesion is stable since January 2014 and most consistent with a benign adrenal adenoma. Right adrenal glands unremarkable.  No right-sided hydronephrosis. Punctate 1 mm right interpolar renal stone. Subcentimeter hypodense right renal lesions too small to accurately characterize but statistically likely represent cysts. Left hydroureteronephrosis to the level of a 5 mm stone in the proximal ureter. Kidney is edematous with perinephric and periureteric inflammatory stranding. Hypodense 1.2 cm left upper pole renal lesion which is stable dating back to April 12, 2017 with Hounsfield units of water density consistent with a cyst. Additional subcentimeter left renal lesions are too small to accurately  characterize but statistically likely represent cysts. Mild wall thickening of an incompletely distended urinary bladder. Stomach/Bowel: Small hiatal hernia otherwise stomach is unremarkable for degree of distension. No pathologic dilation of small or large bowel. The appendix and terminal ileum appear normal. Colonic diverticulosis without findings of acute diverticulitis. Vascular/Lymphatic: Minimal aortic atherosclerosis without aneurysmal dilation. No pathologically enlarged abdominal or pelvic lymph nodes. Reproductive: Uterus and bilateral adnexa are unremarkable. Other: No walled off fluid collections.  No pneumoperitoneum. Musculoskeletal: Remote bilateral lower rib fractures. Remote bilateral inferior pubic rami fractures. Remote parasymphyseal superior pubic ramus fracture. Multilevel degenerative changes spine. No acute osseous abnormality. IMPRESSION: 1. Left hydroureteronephrosis to the level of a 5 mm stone in the proximal ureter, with an edematous left kidney and periureteric/perinephric stranding recommend correlation with laboratory values to exclude superimposed infection. 2. Punctate 1 mm nonobstructing right renal stone. 3. Mild wall thickening of an incompletely distended urinary bladder. Correlate with urinalysis to exclude cystitis. 4. Colonic diverticulosis without findings of acute diverticulitis. 5. Diffuse hepatic steatosis. 6. Stable 2.9 cm left adrenal adenoma. Electronically Signed   By: Dahlia Bailiff M.D.   On: 10/02/2020 18:36   DG Chest Portable 1 View  Result Date: 10/02/2020 CLINICAL DATA:  Shortness of breath, fever. EXAM: PORTABLE CHEST 1 VIEW COMPARISON:  May 29, 2020. FINDINGS: Stable cardiomegaly. No pneumothorax or pleural effusion is noted. Old left rib fractures are noted. Mild bibasilar subsegmental atelectasis or infiltrates are noted. IMPRESSION: Mild bibasilar subsegmental atelectasis or infiltrates are noted. Electronically Signed   By: Marijo Conception M.D.   On:  10/02/2020 17:37   DG OR UROLOGY CYSTO IMAGE ([facility] ONLY)  Result Date: 10/02/2020 There is no interpretation for this exam.  This order is for images obtained during a surgical procedure.  Please See "Surgeries" Tab for more information regarding the procedure.      Subjective: Patient seen and examined at bedside.  She feels much better and denies any nausea, vomiting or fever.  Tolerating diet.  Feels okay to go home today.  Discharge Exam: Vitals:   10/04/20 0456 10/04/20 0816  BP: 108/83 106/79  Pulse: 71 76  Resp: 16 18  Temp: 97.9 F (36.6 C) 97.7 F (36.5 C)  SpO2: 93% 96%    General: Pt is alert, awake, not in acute distress.  Currently on room air. Cardiovascular: rate controlled, S1/S2 + Respiratory: bilateral decreased breath sounds at bases Abdominal: Soft, obese, NT, ND, bowel sounds + Extremities: no edema, no cyanosis    The results of significant diagnostics from this hospitalization (including imaging, microbiology, ancillary and laboratory) are listed below for reference.     Microbiology: Recent Results (from the past 240 hour(s))  Resp Panel by RT-PCR (Flu A&B, Covid) Nasopharyngeal Swab     Status: None   Collection Time: 10/02/20  5:07 PM   Specimen: Nasopharyngeal Swab; Nasopharyngeal(NP) swabs in vial transport medium  Result Value Ref Range Status   SARS Coronavirus 2 by RT PCR NEGATIVE NEGATIVE Final  Comment: (NOTE) SARS-CoV-2 target nucleic acids are NOT DETECTED.  The SARS-CoV-2 RNA is generally detectable in upper respiratory specimens during the acute phase of infection. The lowest concentration of SARS-CoV-2 viral copies this assay can detect is 138 copies/mL. A negative result does not preclude SARS-Cov-2 infection and should not be used as the sole basis for treatment or other patient management decisions. A negative result may occur with  improper specimen collection/handling, submission of specimen other than nasopharyngeal swab,  presence of viral mutation(s) within the areas targeted by this assay, and inadequate number of viral copies(<138 copies/mL). A negative result must be combined with clinical observations, patient history, and epidemiological information. The expected result is Negative.  Fact Sheet for Patients:  EntrepreneurPulse.com.au  Fact Sheet for Healthcare Providers:  IncredibleEmployment.be  This test is no t yet approved or cleared by the [place] FDA and  has been authorized for detection and/or diagnosis of SARS-CoV-2 by FDA under an Emergency Use Authorization (EUA). This EUA will remain  in effect (meaning this test can be used) for the duration of the COVID-19 declaration under Section 564(b)(1) of the Act, 21 U.S.C.section 360bbb-3(b)(1), unless the authorization is terminated  or revoked sooner.       Influenza A by PCR NEGATIVE NEGATIVE Final   Influenza B by PCR NEGATIVE NEGATIVE Final    Comment: (NOTE) The Xpert Xpress SARS-CoV-2/FLU/RSV plus assay is intended as an aid in the diagnosis of influenza from Nasopharyngeal swab specimens and should not be used as a sole basis for treatment. Nasal washings and aspirates are unacceptable for Xpert Xpress SARS-CoV-2/FLU/RSV testing.  Fact Sheet for Patients: EntrepreneurPulse.com.au  Fact Sheet for Healthcare Providers: IncredibleEmployment.be  This test is not yet approved or cleared by the [place] FDA and has been authorized for detection and/or diagnosis of SARS-CoV-2 by FDA under an Emergency Use Authorization (EUA). This EUA will remain in effect (meaning this test can be used) for the duration of the COVID-19 declaration under Section 564(b)(1) of the Act, 21 U.S.C. section 360bbb-3(b)(1), unless the authorization is terminated or revoked.  Performed at [facility], [address] [place], [place] [address]   CULTURE, BLOOD  (ROUTINE X 2) w Reflex to ID Panel     Status: None (Preliminary result)   Collection Time: 10/02/20 11:17 PM   Specimen: BLOOD  Result Value Ref Range Status   Specimen Description BLOOD LEFT FOREARM  Final   Special Requests   Final    BOTTLES DRAWN AEROBIC AND ANAEROBIC Blood Culture results may not be optimal due to an inadequate volume of blood received in culture bottles   Culture   Final    NO GROWTH 2 DAYS Performed at [facility], [address], [place], [place] [address]    Report Status PENDING  Incomplete  CULTURE, BLOOD (ROUTINE X 2) w Reflex to ID Panel     Status: None (Preliminary result)   Collection Time: 10/02/20 11:17 PM   Specimen: BLOOD  Result Value Ref Range Status   Specimen Description BLOOD LEFT ASSIST CONTROL  Final   Special Requests   Final    BOTTLES DRAWN AEROBIC ONLY Blood Culture adequate volume   Culture   Final    NO GROWTH 2 DAYS Performed at [facility], [address] [place], [place] [address]    Report Status PENDING  Incomplete  Surgical PCR screen     Status: None   Collection Time: 10/03/20  1:48 PM   Specimen:  Nasal Mucosa; Nasal Swab  Result Value Ref Range Status   MRSA, PCR NEGATIVE NEGATIVE Final   Staphylococcus aureus NEGATIVE NEGATIVE Final    Comment: (NOTE) The Xpert SA Assay (FDA approved for NASAL specimens in patients 92 years of age and older), is one component of a comprehensive surveillance program. It is not intended to diagnose infection nor to guide or monitor treatment. Performed at [facility], [address] [place], [place] [address]      Labs: BNP (last 3 results) No results for input(s): BNP in the last 8760 hours. Basic Metabolic Panel: Recent Labs  Lab 10/02/20 1429 10/03/20 0841 10/04/20 0510  NA 139 139 139  K 2.9* 4.0 3.5  CL 102 102 106  CO2 '26 28 28  '$ GLUCOSE 98 125* 100*  BUN '11 15 15  '$ CREATININE 0.82 0.74 0.85  CALCIUM 8.8* 8.9 8.3*  MG 1.7  --  1.8    Liver Function Tests: Recent Labs  Lab 10/02/20 1429  AST 19  ALT 14  ALKPHOS 51  BILITOT 2.4*  PROT 6.9  ALBUMIN 3.6   Recent Labs  Lab 10/02/20 1429  LIPASE 26   No results for input(s): AMMONIA in the last 168 hours. CBC: Recent Labs  Lab 10/02/20 1429 10/03/20 0841 10/04/20 0510  WBC 11.8* 12.5* 9.5  NEUTROABS  --   --  7.0  HGB 14.7 15.2* 13.4  HCT 45.4 46.6* 41.6  MCV 91.5 94.1 93.7  PLT 228 227 220   Cardiac Enzymes: No results for input(s): CKTOTAL, CKMB, CKMBINDEX, TROPONINI in the last 168 hours. BNP: Invalid input(s): POCBNP CBG: No results for input(s): GLUCAP in the last 168 hours. D-Dimer No results for input(s): DDIMER in the last 72 hours. Hgb A1c No results for input(s): HGBA1C in the last 72 hours. Lipid Profile No results for input(s): CHOL, HDL, LDLCALC, TRIG, CHOLHDL, LDLDIRECT in the last 72 hours. Thyroid function studies No results for input(s): TSH, T4TOTAL, T3FREE, THYROIDAB in the last 72 hours.  Invalid input(s): FREET3 Anemia work up No results for input(s): VITAMINB12, FOLATE, FERRITIN, TIBC, IRON, RETICCTPCT in the last 72 hours. Urinalysis    Component Value Date/Time   COLORURINE YELLOW 10/02/2020 1707   APPEARANCEUR CLOUDY (A) 10/02/2020 1707   APPEARANCEUR Cloudy (A) 05/09/2017 1036   LABSPEC 1.015 10/02/2020 1707   LABSPEC 1.013 01/14/2014 0117   PHURINE 5.5 10/02/2020 1707   GLUCOSEU NEGATIVE 10/02/2020 1707   GLUCOSEU Negative 01/14/2014 0117   HGBUR SMALL (A) 10/02/2020 1707   BILIRUBINUR SMALL (A) 10/02/2020 1707   BILIRUBINUR Negative 05/09/2017 1036   BILIRUBINUR Negative 01/14/2014 0117   KETONESUR NEGATIVE 10/02/2020 1707   PROTEINUR 30 (A) 10/02/2020 1707   UROBILINOGEN 1.0 02/05/2009 2222   NITRITE POSITIVE (A) 10/02/2020 1707   LEUKOCYTESUR LARGE (A) 10/02/2020 1707   LEUKOCYTESUR Negative 01/14/2014 0117   Sepsis Labs Invalid input(s): PROCALCITONIN,  WBC,  LACTICIDVEN Microbiology Recent  Results (from the past 240 hour(s))  Resp Panel by RT-PCR (Flu A&B, Covid) Nasopharyngeal Swab     Status: None   Collection Time: 10/02/20  5:07 PM   Specimen: Nasopharyngeal Swab; Nasopharyngeal(NP) swabs in vial transport medium  Result Value Ref Range Status   SARS Coronavirus 2 by RT PCR NEGATIVE NEGATIVE Final    Comment: (NOTE) SARS-CoV-2 target nucleic acids are NOT DETECTED.  The SARS-CoV-2 RNA is generally detectable in upper respiratory specimens during the acute phase of infection. The lowest concentration of SARS-CoV-2 viral copies this assay can  detect is 138 copies/mL. A negative result does not preclude SARS-Cov-2 infection and should not be used as the sole basis for treatment or other patient management decisions. A negative result may occur with  improper specimen collection/handling, submission of specimen other than nasopharyngeal swab, presence of viral mutation(s) within the areas targeted by this assay, and inadequate number of viral copies(<138 copies/mL). A negative result must be combined with clinical observations, patient history, and epidemiological information. The expected result is Negative.  Fact Sheet for Patients:  EntrepreneurPulse.com.au  Fact Sheet for Healthcare Providers:  IncredibleEmployment.be  This test is no t yet approved or cleared by the [place] FDA and  has been authorized for detection and/or diagnosis of SARS-CoV-2 by FDA under an Emergency Use Authorization (EUA). This EUA will remain  in effect (meaning this test can be used) for the duration of the COVID-19 declaration under Section 564(b)(1) of the Act, 21 U.S.C.section 360bbb-3(b)(1), unless the authorization is terminated  or revoked sooner.       Influenza A by PCR NEGATIVE NEGATIVE Final   Influenza B by PCR NEGATIVE NEGATIVE Final    Comment: (NOTE) The Xpert Xpress SARS-CoV-2/FLU/RSV plus assay is intended as an aid in the  diagnosis of influenza from Nasopharyngeal swab specimens and should not be used as a sole basis for treatment. Nasal washings and aspirates are unacceptable for Xpert Xpress SARS-CoV-2/FLU/RSV testing.  Fact Sheet for Patients: EntrepreneurPulse.com.au  Fact Sheet for Healthcare Providers: IncredibleEmployment.be  This test is not yet approved or cleared by the [place] FDA and has been authorized for detection and/or diagnosis of SARS-CoV-2 by FDA under an Emergency Use Authorization (EUA). This EUA will remain in effect (meaning this test can be used) for the duration of the COVID-19 declaration under Section 564(b)(1) of the Act, 21 U.S.C. section 360bbb-3(b)(1), unless the authorization is terminated or revoked.  Performed at [facility], [address] [place], [place] [address]   CULTURE, BLOOD (ROUTINE X 2) w Reflex to ID Panel     Status: None (Preliminary result)   Collection Time: 10/02/20 11:17 PM   Specimen: BLOOD  Result Value Ref Range Status   Specimen Description BLOOD LEFT FOREARM  Final   Special Requests   Final    BOTTLES DRAWN AEROBIC AND ANAEROBIC Blood Culture results may not be optimal due to an inadequate volume of blood received in culture bottles   Culture   Final    NO GROWTH 2 DAYS Performed at [facility], [address], [place], [place] [address]    Report Status PENDING  Incomplete  CULTURE, BLOOD (ROUTINE X 2) w Reflex to ID Panel     Status: None (Preliminary result)   Collection Time: 10/02/20 11:17 PM   Specimen: BLOOD  Result Value Ref Range Status   Specimen Description BLOOD LEFT ASSIST CONTROL  Final   Special Requests   Final    BOTTLES DRAWN AEROBIC ONLY Blood Culture adequate volume   Culture   Final    NO GROWTH 2 DAYS Performed at [facility], [address], [place], [place] [address]    Report Status PENDING  Incomplete  Surgical PCR screen      Status: None   Collection Time: 10/03/20  1:48 PM   Specimen: Nasal Mucosa; Nasal Swab  Result Value Ref Range Status   MRSA, PCR NEGATIVE NEGATIVE Final   Staphylococcus aureus NEGATIVE NEGATIVE Final    Comment: (NOTE) The Xpert SA Assay (FDA approved for  NASAL specimens in patients 28 years of age and older), is one component of a comprehensive surveillance program. It is not intended to diagnose infection nor to guide or monitor treatment. Performed at [facility], [address], [place], [place] [address]      Time coordinating discharge: 35 minutes  SIGNED:   Aline August, MD  [facility] 10/04/2020, 10:40 AM

## 2020-10-04 NOTE — TOC CM/SW Note (Signed)
CSW completed chart review.  No TOC needs identified. Please place [facility] consult if needs arise.  Oleh Genin, [place]

## 2020-10-04 NOTE — Progress Notes (Signed)
Mobility Specialist - Progress Note   10/04/20 1100  Mobility  Activity Ambulated in hall;Ambulated to bathroom  Level of Assistance Modified independent, requires aide device or extra time  Assistive Device Front wheel walker  Distance Ambulated (ft) 200 ft  Mobility Out of bed for toileting;Ambulated independently in hallway  Mobility Response Tolerated well  Mobility performed by Mobility specialist  $Mobility charge 1 Mobility    Pt ambulated in hallway with RW. Denied SOB on RA. No complaints. Ambulated to bathroom upon return to room, incontinent BM. Floors cleaned by mobility. Independent for peri-care. Pt returned to bed, anticipating d/c later this date.    Kathee Delton Mobility Specialist 10/04/20, 11:38 AM

## 2020-10-05 NOTE — Anesthesia Postprocedure Evaluation (Signed)
Anesthesia Post Note  Patient: MIRCALE CHEAH  Procedure(s) Performed: CYSTOSCOPY WITH STENT PLACEMENT (Left)  Patient location during evaluation: PACU Anesthesia Type: General Level of consciousness: awake and alert Pain management: pain level controlled Vital Signs Assessment: post-procedure vital signs reviewed and stable Respiratory status: spontaneous breathing, nonlabored ventilation, respiratory function stable and patient connected to nasal cannula oxygen Cardiovascular status: blood pressure returned to baseline and stable Postop Assessment: no apparent nausea or vomiting Anesthetic complications: no   No notable events documented.   Last Vitals:  Vitals:   10/04/20 0456 10/04/20 0816  BP: 108/83 106/79  Pulse: 71 76  Resp: 16 18  Temp: 36.6 C 36.5 C  SpO2: 93% 96%    Last Pain:  Vitals:   10/04/20 0816  TempSrc: Oral  PainSc:                  Molli Barrows

## 2020-10-06 ENCOUNTER — Encounter: Payer: Self-pay | Admitting: Urology

## 2020-10-06 ENCOUNTER — Telehealth: Payer: Self-pay | Admitting: Urology

## 2020-10-06 LAB — URINE CULTURE: Culture: >=100000 — AB

## 2020-10-06 NOTE — Telephone Encounter (Signed)
Per Dr. Diamantina Providence Patient is to be scheduled for LEFT URETEROSCOPY,LASER [facility]  was contacted and possible surgical dates were discussed, 10/24/20 was agreed upon for surgery. Patient was directed to call [phone] between 1-3pm the day before surgery to find out surgical arrival time.  Instructions were given not to eat or drink from midnight on the night before surgery and have a driver for the day of surgery. On the surgery day patient was instructed to enter through the [place] entrance of [facility] report the Same Day Surgery desk.   Pre-Admit Testing will be in contact via phone to set up an interview with the anesthesia team to review your history and medications prior to surgery.   Reminder of this information was sent via MAIL to the patient.   Patient is not to hold anticoag's per Dr. Diamantina Providence.  Currently taking ASA 81 MG.

## 2020-10-07 ENCOUNTER — Telehealth: Payer: Self-pay | Admitting: *Deleted

## 2020-10-07 ENCOUNTER — Other Ambulatory Visit: Payer: Self-pay

## 2020-10-07 DIAGNOSIS — N201 Calculus of ureter: Secondary | ICD-10-CM

## 2020-10-07 LAB — CULTURE, BLOOD (ROUTINE X 2)
Culture: NO GROWTH
Culture: NO GROWTH
Special Requests: ADEQUATE

## 2020-10-07 NOTE — Progress Notes (Signed)
[facility] Posting Form    Surgery Date/Time: Date: 10/24/2020   Surgeon: Dr. Nickolas Madrid   Surgery Location: Day Surgery   Inpt ( No  )   Outpt (Yes)   Obs ( No  )    Diagnosis: N20.1 Left Ureteral Stone   -CPT: [phone]    Surgery: Left URS/LL/Stent   Stop Anticoagulations: No     *Orders entered into EPIC  Date: '@TODAY'$ @    *Case booked in EPIC  Date: 10/06/2020   *Notified pt of Surgery: Date: 10/06/2020     *Placed into Prior Authorization Work Fabio Bering Date: '@TODAY'$ @     Assistant/laser/rep:No

## 2020-10-07 NOTE — Progress Notes (Signed)
Surgical Physician Order Form  *Length of Case: 30  *Medical Clearance if needed:    PCP     []      Cardiology    []       Pulmonology []  *MD to give Clearance: n/a  *Anticoagulation: Hold all anticoagulation [No]  May continue aspirin [Yes]          May continue all anticoagulants [Yes]  *Post-op visit Date/Instructions: TBD         KUB prior []    RUS prior   []    Voiding trial  []    Cysto stent removal  []   Follow up with: TBD   *Diagnosis: N20.1 left ureteral stone  *Procedure: Left URS/LL/Stent  Admit type: Outpatient  Yes  Observation No   Inpatient No    Number of Days: ___   Anesthesia: general   Labs:  Per Anesthesia  [YES]    UA&Urine Culture [No]  CBC  []   B MET   []   PT/INR   []  aPTT  []           Urine Pregnancy []   Urine Drug Screen []    Type & Screen  []  Crossmatch: ____ units  Other: ________   Tests:  EKG/Chest x-ray per Anesthesia [YES]         Chest X-ray  []  EKG  []  KUB day of procedure  []   Medications:   Ancef 1g IV  []      Ancef 2g IV  [YES]  Ciprofloxacin 413m IV  []  Vancomycin 1g IV  []  Ceftriaxone (Rocephin) 1g IV  []    Clindamycin 6062mIV  []  Clindamycin 90094mV  []  Ampicillin 1g IV  []  Keflex 500m14m  []  Gentamicin __ mg IV or per pharmacy []  Gemcitabine 2000mg39mdder instillation []     Diflucan 100mg 82m[]   Botox injection [] units ____  Magnesium citrate 1 bottle PO - when?___     Fleets enema - when?__  Other:__________     VTE Prophylaxis:     SCD's  [YES]      TED Hose  []    Heparin 5000 units sq  []        Other: __________

## 2020-10-07 NOTE — Telephone Encounter (Signed)
Transition Care Management Unsuccessful Follow-up Telephone Call  Date of discharge and from where:  10/04/2020  [facility]  Attempts:  1st Attempt  Patient called RN care manager back, did not leave message.  RN care manager called patient back and no answer to telephone.  Reason for unsuccessful TCM follow-up call:  No answer/busy  Jacqlyn Larsen [facility], BSN RN Case Manager [phone]

## 2020-10-07 NOTE — Telephone Encounter (Signed)
Transition Care Management Unsuccessful Follow-up Telephone Call  Date of discharge and from where:  10/04/20 [facility]  Attempts:  1st Attempt  Reason for unsuccessful TCM follow-up call:  Left voice message  Jacqlyn Larsen [facility], BSN RN Case Manager [phone]

## 2020-10-07 NOTE — Progress Notes (Signed)
BP (!) 162/109 (BP Location: Right Arm, Cuff Size: Normal)   Pulse 68   Temp 97.6 F (36.4 C) (Oral)   Wt 214 lb 3.2 oz (97.2 kg)   SpO2 97%   BMI 31.63 kg/m    Subjective:    Patient ID: [name], female    DOB: 11/22/1965, 55 y.o.   MRN: [id number]  HPI: [name] is a 55 y.o. female  Chief Complaint  Patient presents with   Surgery Follow Up    Pt states she had surgery on 10/02/20 to remove large kidney stones. States all medications were stopped except Tamsulosin and Keflex per patient   Vaginal Itching    Pt states since starting the keflex, she has developed vaginal itching.    Transition of Care Hospital Follow up.   Hospital/Facility: [facility] D/C Physician: Aline August, MD  D/C Date: 10/02/2020-10/04/2020  Records Requested: NA Records Received: NA Records Reviewed: Yes  Diagnoses on Discharge:   Sepsis: Present on admission- has surgery scheduled on 10/24/20 for Cystocopy Acute pyelonephritis- Discharged on Keflex for 10 days and follow up with Urology. Patient is taking the Keflex but feels like she might have a yeast infection.  Patient will have the Stent removed on 10/24/20.   2. Leukocytosis- will recheck CBC in office today. -Resolved  3. Hypokalemia- Will recheck CMP in office today. -Resolved  4. Essential hypertension- was instructed to hold all blood pressure medications until after follow up with PCP   Denies HA, CP, SOB, dizziness, palpitations, visual changes, and lower extremity swelling.  Date of interactive Contact within 48 hours of discharge:  Contact was through:  unsuccessful  Date of 7 day or 14 day face-to-face visit:    within 7 days  Outpatient Encounter Medications as of 10/08/2020  Medication Sig   amLODipine (NORVASC) 10 MG tablet Take 1 tablet (10 mg total) by mouth daily.   cephALEXin (KEFLEX) 500 MG capsule Take 1 capsule (500 mg total) by mouth 4 (four) times daily for 10 days.   cloNIDine (CATAPRES) 0.1 MG tablet  Take 1 tablet (0.1 mg total) by mouth daily.   furosemide (LASIX) 20 MG tablet Take 1 tablet (20 mg total) by mouth daily.   furosemide (LASIX) 40 MG tablet Take 1 tablet (40 mg total) by mouth daily.   tamsulosin (FLOMAX) 0.4 MG CAPS capsule Take 1 capsule (0.4 mg total) by mouth daily after supper.   allopurinol (ZYLOPRIM) 300 MG tablet Take 1 tablet (300 mg total) by mouth daily.   aspirin EC 81 MG tablet Take 1 tablet (81 mg total) by mouth daily.   atorvastatin (LIPITOR) 40 MG tablet Take 1 tablet (40 mg total) by mouth daily.   colchicine 0.6 MG tablet TAKE ONE (1) TABLET BY MOUTH TWICE DAILY AS NEEDED FOR GOUT FLARES   gabapentin (NEURONTIN) 300 MG capsule 300 mg qAM, 600 mg qhs   oxybutynin (DITROPAN) 5 MG tablet Take 1 tablet (5 mg total) by mouth 3 (three) times daily. (Patient not taking: Reported on 10/08/2020)   pantoprazole (PROTONIX) 40 MG tablet Take 1 tablet (40 mg total) by mouth daily.   PROAIR HFA 108 (90 Base) MCG/ACT inhaler INHALE TWO (2) PUFFS BY MOUTH EVERY 6 HOURS AS NEEDED FOR WHEEZING OR FOR SHORTNESS OF BREATH   tiotropium (SPIRIVA HANDIHALER) 18 MCG inhalation capsule Place 1 capsule (18 mcg total) into inhaler and inhale daily.   [DISCONTINUED] allopurinol (ZYLOPRIM) 300 MG tablet TAKE 1 TABLET BY MOUTH EVERY DAY (  Patient not taking: Reported on 10/08/2020)   [DISCONTINUED] aspirin EC 81 MG tablet Take 81 mg by mouth daily. (Patient not taking: Reported on 10/08/2020)   [DISCONTINUED] atorvastatin (LIPITOR) 40 MG tablet Take 40 mg by mouth daily. (Patient not taking: Reported on 10/08/2020)   [DISCONTINUED] colchicine 0.6 MG tablet TAKE ONE (1) TABLET BY MOUTH TWICE DAILY AS NEEDED FOR GOUT FLARES (Patient not taking: Reported on 10/08/2020)   [DISCONTINUED] gabapentin (NEURONTIN) 300 MG capsule 300 mg qAM, 600 mg qhs (Patient not taking: Reported on 10/08/2020)   [DISCONTINUED] pantoprazole (PROTONIX) 40 MG tablet TAKE 1 TABLET BY MOUTH EVERY DAY (Patient not taking:  Reported on 10/08/2020)   [DISCONTINUED] PROAIR HFA 108 (90 Base) MCG/ACT inhaler INHALE TWO (2) PUFFS BY MOUTH EVERY 6 HOURS AS NEEDED FOR WHEEZING OR FOR SHORTNESS OF BREATH (Patient not taking: Reported on 10/08/2020)   [DISCONTINUED] sertraline (ZOLOFT) 100 MG tablet Take 1 tablet (100 mg total) by mouth daily. (Patient not taking: Reported on 10/08/2020)   [DISCONTINUED] tiotropium (SPIRIVA HANDIHALER) 18 MCG inhalation capsule Place 1 capsule (18 mcg total) into inhaler and inhale daily. (Patient not taking: Reported on 10/08/2020)   No facility-administered encounter medications on file as of 10/08/2020.    Diagnostic Tests Reviewed/Disposition: Yes  Consults: urology  Discharge Instructions: Reviewed with patient  Disease/illness Education: Completed  Home Health/Community Services Discussions/Referrals: NA  Establishment or re-establishment of referral orders for community resources: NA  Discussion with other health care providers: Has follow up and surgery planned for September 30  Assessment and Support of treatment regimen adherence: provided  Appointments Coordinated with: Urology  Education for self-management, independent living, and ADLs: Provided  Relevant past medical, surgical, family and social history reviewed and updated as indicated. Interim medical history since our last visit reviewed. Allergies and medications reviewed and updated.  Review of Systems  Eyes:  Negative for visual disturbance.  Respiratory:  Negative for cough, chest tightness and shortness of breath.   Cardiovascular:  Negative for chest pain, palpitations and leg swelling.  Genitourinary:  Positive for vaginal discharge.  Neurological:  Negative for dizziness and headaches.   Per HPI unless specifically indicated above     Objective:    BP (!) 162/109 (BP Location: Right Arm, Cuff Size: Normal)   Pulse 68   Temp 97.6 F (36.4 C) (Oral)   Wt 214 lb 3.2 oz (97.2 kg)   SpO2 97%   BMI  31.63 kg/m   Wt Readings from Last 3 Encounters:  10/08/20 214 lb 3.2 oz (97.2 kg)  10/02/20 224 lb (101.6 kg)  05/29/20 224 lb 10.4 oz (101.9 kg)    Physical Exam Vitals and nursing note reviewed.  Constitutional:      General: She is not in acute distress.    Appearance: Normal appearance. She is normal weight. She is not ill-appearing, toxic-appearing or diaphoretic.  HENT:     Head: Normocephalic.     Right Ear: External ear normal.     Left Ear: External ear normal.     Nose: Nose normal.     Mouth/Throat:     Mouth: Mucous membranes are moist.     Pharynx: Oropharynx is clear.  Eyes:     General:        Right eye: No discharge.        Left eye: No discharge.     Extraocular Movements: Extraocular movements intact.     Conjunctiva/sclera: Conjunctivae normal.     Pupils: Pupils are equal, round,  and reactive to light.  Cardiovascular:     Rate and Rhythm: Normal rate and regular rhythm.     Heart sounds: No murmur heard. Pulmonary:     Effort: Pulmonary effort is normal. No respiratory distress.     Breath sounds: Normal breath sounds. No wheezing or rales.  Abdominal:     General: Abdomen is flat. Bowel sounds are normal. There is no distension.     Palpations: Abdomen is soft.     Tenderness: There is no abdominal tenderness. There is no right CVA tenderness, left CVA tenderness or guarding.  Musculoskeletal:     Cervical back: Normal range of motion and neck supple.  Skin:    General: Skin is warm and dry.     Capillary Refill: Capillary refill takes less than 2 seconds.  Neurological:     General: No focal deficit present.     Mental Status: She is alert and oriented to person, place, and time. Mental status is at baseline.  Psychiatric:        Mood and Affect: Mood normal.        Behavior: Behavior normal.        Thought Content: Thought content normal.        Judgment: Judgment normal.    Results for orders placed or performed during the hospital  encounter of 10/02/20  Resp Panel by RT-PCR (Flu A&B, Covid) Nasopharyngeal Swab   Specimen: Nasopharyngeal Swab; Nasopharyngeal(NP) swabs in vial transport medium  Result Value Ref Range   SARS Coronavirus 2 by RT PCR NEGATIVE NEGATIVE   Influenza A by PCR NEGATIVE NEGATIVE   Influenza B by PCR NEGATIVE NEGATIVE  CULTURE, BLOOD (ROUTINE X 2) w Reflex to ID Panel   Specimen: BLOOD  Result Value Ref Range   Specimen Description BLOOD LEFT FOREARM    Special Requests      BOTTLES DRAWN AEROBIC AND ANAEROBIC Blood Culture results may not be optimal due to an inadequate volume of blood received in culture bottles   Culture      NO GROWTH 5 DAYS Performed at [facility], [address] [place], [place] [address]    Report Status 10/07/2020 FINAL   CULTURE, BLOOD (ROUTINE X 2) w Reflex to ID Panel   Specimen: BLOOD  Result Value Ref Range   Specimen Description BLOOD LEFT ASSIST CONTROL    Special Requests      BOTTLES DRAWN AEROBIC ONLY Blood Culture adequate volume   Culture      NO GROWTH 5 DAYS Performed at [facility], [address] [place], [place] [address]    Report Status 10/07/2020 FINAL   Urine Culture   Specimen: Urine, Random  Result Value Ref Range   Specimen Description      URINE, RANDOM Performed at [facility], [address], [place], [place] [address]    Special Requests      NONE Performed at [facility], [address] [place], [place] [address]    Culture >=100,000 COLONIES/mL ESCHERICHIA COLI (A)    Report Status 10/06/2020 FINAL    Organism ID, Bacteria ESCHERICHIA COLI (A)       Susceptibility   Escherichia coli - MIC*    AMPICILLIN >=32 RESISTANT Resistant     CEFAZOLIN <=4 SENSITIVE Sensitive     CEFEPIME <=0.12 SENSITIVE Sensitive     CEFTRIAXONE <=0.25 SENSITIVE Sensitive     CIPROFLOXACIN <=0.25 SENSITIVE Sensitive     GENTAMICIN >=16 RESISTANT Resistant  IMIPENEM <=0.25 SENSITIVE Sensitive      NITROFURANTOIN <=16 SENSITIVE Sensitive     TRIMETH/SULFA >=320 RESISTANT Resistant     AMPICILLIN/SULBACTAM >=32 RESISTANT Resistant     PIP/TAZO <=4 SENSITIVE Sensitive     * >=100,000 COLONIES/mL ESCHERICHIA COLI  Surgical PCR screen   Specimen: Nasal Mucosa; Nasal Swab  Result Value Ref Range   MRSA, PCR NEGATIVE NEGATIVE   Staphylococcus aureus NEGATIVE NEGATIVE  Lipase, blood  Result Value Ref Range   Lipase 26 11 - 51 U/L  Comprehensive metabolic panel  Result Value Ref Range   Sodium 139 135 - 145 mmol/L   Potassium 2.9 (L) 3.5 - 5.1 mmol/L   Chloride 102 98 - 111 mmol/L   CO2 26 22 - 32 mmol/L   Glucose, Bld 98 70 - 99 mg/dL   BUN 11 6 - 20 mg/dL   Creatinine, Ser 0.82 0.44 - 1.00 mg/dL   Calcium 8.8 (L) 8.9 - 10.3 mg/dL   Total Protein 6.9 6.5 - 8.1 g/dL   Albumin 3.6 3.5 - 5.0 g/dL   AST 19 15 - 41 U/L   ALT 14 0 - 44 U/L   Alkaline Phosphatase 51 38 - 126 U/L   Total Bilirubin 2.4 (H) 0.3 - 1.2 mg/dL   GFR, Estimated >60 >60 mL/min   Anion gap 11 5 - 15  CBC  Result Value Ref Range   WBC 11.8 (H) 4.0 - 10.5 K/uL   RBC 4.96 3.87 - 5.11 MIL/uL   Hemoglobin 14.7 12.0 - 15.0 g/dL   HCT 45.4 36.0 - 46.0 %   MCV 91.5 80.0 - 100.0 fL   MCH 29.6 26.0 - 34.0 pg   MCHC 32.4 30.0 - 36.0 g/dL   RDW 14.7 11.5 - 15.5 %   Platelets 228 150 - 400 K/uL   nRBC 0.0 0.0 - 0.2 %  Urinalysis, Complete w Microscopic  Result Value Ref Range   Color, Urine YELLOW YELLOW   APPearance CLOUDY (A) CLEAR   Specific Gravity, Urine 1.015 1.005 - 1.030   pH 5.5 5.0 - 8.0   Glucose, UA NEGATIVE NEGATIVE mg/dL   Hgb urine dipstick SMALL (A) NEGATIVE   Bilirubin Urine SMALL (A) NEGATIVE   Ketones, ur NEGATIVE NEGATIVE mg/dL   Protein, ur 30 (A) NEGATIVE mg/dL   Nitrite POSITIVE (A) NEGATIVE   Leukocytes,Ua LARGE (A) NEGATIVE   Squamous Epithelial / LPF 0-5 0 - 5   WBC, UA >50 0 - 5 WBC/hpf   RBC / HPF 11-20 0 - 5 RBC/hpf   Bacteria, UA MANY (A) NONE SEEN   Mucus PRESENT     WBC Casts, UA PRESENT   Magnesium  Result Value Ref Range   Magnesium 1.7 1.7 - 2.4 mg/dL  Lactic acid, plasma  Result Value Ref Range   Lactic Acid, Venous 0.7 0.5 - 1.9 mmol/L  HIV Antibody (routine testing w rflx)  Result Value Ref Range   HIV Screen 4th Generation wRfx Non Reactive Non Reactive  Basic metabolic panel  Result Value Ref Range   Sodium 139 135 - 145 mmol/L   Potassium 4.0 3.5 - 5.1 mmol/L   Chloride 102 98 - 111 mmol/L   CO2 28 22 - 32 mmol/L   Glucose, Bld 125 (H) 70 - 99 mg/dL   BUN 15 6 - 20 mg/dL   Creatinine, Ser 0.74 0.44 - 1.00 mg/dL   Calcium 8.9 8.9 - 10.3 mg/dL   GFR, Estimated >60 >60 mL/min  Anion gap 9 5 - 15  CBC  Result Value Ref Range   WBC 12.5 (H) 4.0 - 10.5 K/uL   RBC 4.95 3.87 - 5.11 MIL/uL   Hemoglobin 15.2 (H) 12.0 - 15.0 g/dL   HCT 46.6 (H) 36.0 - 46.0 %   MCV 94.1 80.0 - 100.0 fL   MCH 30.7 26.0 - 34.0 pg   MCHC 32.6 30.0 - 36.0 g/dL   RDW 14.6 11.5 - 15.5 %   Platelets 227 150 - 400 K/uL   nRBC 0.0 0.0 - 0.2 %  CBC with Differential/Platelet  Result Value Ref Range   WBC 9.5 4.0 - 10.5 K/uL   RBC 4.44 3.87 - 5.11 MIL/uL   Hemoglobin 13.4 12.0 - 15.0 g/dL   HCT 41.6 36.0 - 46.0 %   MCV 93.7 80.0 - 100.0 fL   MCH 30.2 26.0 - 34.0 pg   MCHC 32.2 30.0 - 36.0 g/dL   RDW 14.5 11.5 - 15.5 %   Platelets 220 150 - 400 K/uL   nRBC 0.0 0.0 - 0.2 %   Neutrophils Relative % 74 %   Neutro Abs 7.0 1.7 - 7.7 K/uL   Lymphocytes Relative 16 %   Lymphs Abs 1.5 0.7 - 4.0 K/uL   Monocytes Relative 9 %   Monocytes Absolute 0.8 0.1 - 1.0 K/uL   Eosinophils Relative 1 %   Eosinophils Absolute 0.1 0.0 - 0.5 K/uL   Basophils Relative 0 %   Basophils Absolute 0.0 0.0 - 0.1 K/uL   Immature Granulocytes 0 %   Abs Immature Granulocytes 0.03 0.00 - 0.07 K/uL  Basic metabolic panel  Result Value Ref Range   Sodium 139 135 - 145 mmol/L   Potassium 3.5 3.5 - 5.1 mmol/L   Chloride 106 98 - 111 mmol/L   CO2 28 22 - 32 mmol/L   Glucose, Bld 100  (H) 70 - 99 mg/dL   BUN 15 6 - 20 mg/dL   Creatinine, Ser 0.85 0.44 - 1.00 mg/dL   Calcium 8.3 (L) 8.9 - 10.3 mg/dL   GFR, Estimated >60 >60 mL/min   Anion gap 5 5 - 15  Magnesium  Result Value Ref Range   Magnesium 1.8 1.7 - 2.4 mg/dL      Assessment & Plan:   Problem List Items Addressed This Visit       Cardiovascular and Mediastinum   Essential hypertension (Chronic)    Ongoing. Blood pressure was low in the hospital and patient was instructed to stop all blood pressure medications.  Will restart Amlodipine, Clonidine, and Lasix today.  Recommend patient follow up with HF clinic for further management.  Follow up in office in 1 month for reevaluation.  If blood pressure is still high at that point can add back Lisinopril and HCTZ.      Relevant Medications   atorvastatin (LIPITOR) 40 MG tablet   aspirin EC 81 MG tablet   cloNIDine (CATAPRES) 0.1 MG tablet   amLODipine (NORVASC) 10 MG tablet   furosemide (LASIX) 20 MG tablet   furosemide (LASIX) 40 MG tablet     Other   Hypokalemia    Labs ordered today. Will make recommendations based on lab results.      Other Visit Diagnoses     Hospital discharge follow-up    -  Primary   Has follow up scheduled with Urology. Plans to call HF clinic today to make follow up appointment.    Relevant Orders   Comp Met (  CMET)   CBC w/Diff   Sepsis, due to unspecified organism, unspecified whether acute organ dysfunction present [facility])       Reviewed the importance of completing course of antibiotics with patient.    Kidney stone       Improving. Follows up with Urology on 10/24/20 to have Stent removed. Denies concerns at visit today.    Relevant Medications   colchicine 0.6 MG tablet   allopurinol (ZYLOPRIM) 300 MG tablet   Vaginal itching       Wet prep obtained and positive for BV. Patient already on Keflex. Will retest at next to see if resolved. If not, will treat with Flagyl at that time.   Relevant Orders   WET PREP FOR  Brandt, YEAST, CLUE        Follow up plan: Return in about 1 month (around 11/07/2020) for blood pressure check       .

## 2020-10-07 NOTE — Telephone Encounter (Signed)
[facility] Posting Form   Surgery Date/Time: Date: 10/24/2020  Surgeon: Dr. Nickolas Madrid  Surgery Location: Day Surgery  Inpt ( No  )   Outpt (Yes)   Obs ( No  )   Diagnosis: N20.1 Left Ureteral Stone  -CPT: [phone]   Surgery: Left URS/LL/Stent  Stop Anticoagulations: No   *Orders entered into EPIC  Date: '@TODAY'$ @   *Case booked in EPIC  Date: 10/06/2020  *Notified pt of Surgery: Date: 10/06/2020   *Placed into Prior Authorization Work Fabio Bering Date: '@TODAY'$ @   Assistant/laser/rep:No

## 2020-10-08 ENCOUNTER — Ambulatory Visit (INDEPENDENT_AMBULATORY_CARE_PROVIDER_SITE_OTHER): Payer: Medicaid Other | Admitting: Nurse Practitioner

## 2020-10-08 ENCOUNTER — Other Ambulatory Visit: Payer: Self-pay

## 2020-10-08 ENCOUNTER — Encounter: Payer: Self-pay | Admitting: Nurse Practitioner

## 2020-10-08 VITALS — BP 162/109 | HR 68 | Temp 97.6°F | Wt 214.2 lb

## 2020-10-08 DIAGNOSIS — Z09 Encounter for follow-up examination after completed treatment for conditions other than malignant neoplasm: Secondary | ICD-10-CM

## 2020-10-08 DIAGNOSIS — I10 Essential (primary) hypertension: Secondary | ICD-10-CM

## 2020-10-08 DIAGNOSIS — E876 Hypokalemia: Secondary | ICD-10-CM

## 2020-10-08 DIAGNOSIS — A419 Sepsis, unspecified organism: Secondary | ICD-10-CM

## 2020-10-08 DIAGNOSIS — N898 Other specified noninflammatory disorders of vagina: Secondary | ICD-10-CM

## 2020-10-08 DIAGNOSIS — N200 Calculus of kidney: Secondary | ICD-10-CM

## 2020-10-08 LAB — WET PREP FOR TRICH, YEAST, CLUE
Clue Cell Exam: POSITIVE — AB
Trichomonas Exam: NEGATIVE
Yeast Exam: NEGATIVE

## 2020-10-08 MED ORDER — CLONIDINE HCL 0.1 MG PO TABS: 0.1000 mg | ORAL_TABLET | Freq: Every day | ORAL | 1 refills | Status: DC

## 2020-10-08 MED ORDER — FUROSEMIDE 20 MG PO TABS: 20.0000 mg | ORAL_TABLET | Freq: Every day | ORAL | 3 refills | Status: DC

## 2020-10-08 MED ORDER — COLCHICINE 0.6 MG PO TABS: ORAL_TABLET | ORAL | 1 refills | Status: DC

## 2020-10-08 MED ORDER — ASPIRIN EC 81 MG PO TBEC: 81.0000 mg | DELAYED_RELEASE_TABLET | Freq: Every day | ORAL | 1 refills | Status: AC

## 2020-10-08 MED ORDER — ALLOPURINOL 300 MG PO TABS: 300.0000 mg | ORAL_TABLET | Freq: Every day | ORAL | 0 refills | Status: DC

## 2020-10-08 MED ORDER — PANTOPRAZOLE SODIUM 40 MG PO TBEC: 40.0000 mg | DELAYED_RELEASE_TABLET | Freq: Every day | ORAL | 6 refills | Status: DC

## 2020-10-08 MED ORDER — PROAIR HFA 108 (90 BASE) MCG/ACT IN AERS: INHALATION_SPRAY | RESPIRATORY_TRACT | 6 refills | Status: DC

## 2020-10-08 MED ORDER — GABAPENTIN 300 MG PO CAPS: ORAL_CAPSULE | ORAL | 5 refills | Status: DC

## 2020-10-08 MED ORDER — SPIRIVA HANDIHALER 18 MCG IN CAPS: 18.0000 ug | ORAL_CAPSULE | Freq: Every day | RESPIRATORY_TRACT | 1 refills | Status: DC

## 2020-10-08 MED ORDER — AMLODIPINE BESYLATE 10 MG PO TABS: 10.0000 mg | ORAL_TABLET | Freq: Every day | ORAL | 1 refills | Status: DC

## 2020-10-08 MED ORDER — ATORVASTATIN CALCIUM 40 MG PO TABS: 40.0000 mg | ORAL_TABLET | Freq: Every day | ORAL | 1 refills | Status: DC

## 2020-10-08 MED ORDER — FUROSEMIDE 40 MG PO TABS: 40.0000 mg | ORAL_TABLET | Freq: Every day | ORAL | 1 refills | Status: DC

## 2020-10-08 NOTE — Progress Notes (Signed)
Results discussed with patient during office visit today.

## 2020-10-08 NOTE — Assessment & Plan Note (Signed)
Ongoing. Blood pressure was low in the hospital and patient was instructed to stop all blood pressure medications.  Will restart Amlodipine, Clonidine, and Lasix today.  Recommend patient follow up with HF clinic for further management.  Follow up in office in 1 month for reevaluation.  If blood pressure is still high at that point can add back Lisinopril and HCTZ.

## 2020-10-08 NOTE — Assessment & Plan Note (Signed)
Labs ordered today.  Will make recommendations based on lab results. ?

## 2020-10-09 LAB — CBC WITH DIFFERENTIAL/PLATELET
Basophils Absolute: 0 10*3/uL (ref 0.0–0.2)
Basos: 1 %
EOS (ABSOLUTE): 0.2 10*3/uL (ref 0.0–0.4)
Eos: 3 %
Hematocrit: 42.3 % (ref 34.0–46.6)
Hemoglobin: 14.3 g/dL (ref 11.1–15.9)
Immature Grans (Abs): 0 10*3/uL (ref 0.0–0.1)
Immature Granulocytes: 1 %
Lymphocytes Absolute: 1.6 10*3/uL (ref 0.7–3.1)
Lymphs: 31 %
MCH: 30 pg (ref 26.6–33.0)
MCHC: 33.8 g/dL (ref 31.5–35.7)
MCV: 89 fL (ref 79–97)
Monocytes Absolute: 0.5 10*3/uL (ref 0.1–0.9)
Monocytes: 8 %
Neutrophils Absolute: 3.1 10*3/uL (ref 1.4–7.0)
Neutrophils: 56 %
Platelets: 139 10*3/uL — ABNORMAL LOW (ref 150–450)
RBC: 4.76 x10E6/uL (ref 3.77–5.28)
RDW: 14.1 % (ref 11.7–15.4)
WBC: 5.5 10*3/uL (ref 3.4–10.8)

## 2020-10-09 LAB — COMPREHENSIVE METABOLIC PANEL
ALT: 8 IU/L (ref 0–32)
AST: 18 IU/L (ref 0–40)
Albumin/Globulin Ratio: 1.3 (ref 1.2–2.2)
Albumin: 4 g/dL (ref 3.8–4.9)
Alkaline Phosphatase: 62 IU/L (ref 44–121)
BUN/Creatinine Ratio: 11 (ref 9–23)
BUN: 7 mg/dL (ref 6–24)
Bilirubin Total: 0.3 mg/dL (ref 0.0–1.2)
CO2: 23 mmol/L (ref 20–29)
Calcium: 9.4 mg/dL (ref 8.7–10.2)
Chloride: 101 mmol/L (ref 96–106)
Creatinine, Ser: 0.65 mg/dL (ref 0.57–1.00)
Globulin, Total: 3.1 g/dL (ref 1.5–4.5)
Glucose: 79 mg/dL (ref 65–99)
Potassium: 3.9 mmol/L (ref 3.5–5.2)
Sodium: 141 mmol/L (ref 134–144)
Total Protein: 7.1 g/dL (ref 6.0–8.5)
eGFR: 105 mL/min/{1.73_m2} (ref >59)

## 2020-10-09 NOTE — Progress Notes (Signed)
Please let the patient know that her lab work continues to improve from her hospitalization.  We will recheck it in 1 month at her follow up.

## 2020-10-15 ENCOUNTER — Other Ambulatory Visit
Admission: RE | Admit: 2020-10-15 | Discharge: 2020-10-15 | Disposition: A | Discharge status: Home / Self Care | Payer: Medicaid Other | Source: Ambulatory Visit | Attending: Urology | Admitting: Urology

## 2020-10-15 ENCOUNTER — Other Ambulatory Visit: Payer: Self-pay

## 2020-10-15 HISTORY — DX: Personal history of urinary calculi: Z87.442

## 2020-10-15 HISTORY — DX: Anxiety disorder, unspecified: F41.9

## 2020-10-15 NOTE — Patient Instructions (Signed)
Your procedure is scheduled on: Friday October 24, 2020. Report to Day Surgery. To find out your arrival time please call [phone] between 1PM - 3PM on Thursday October 23, 2020.  Remember: Instructions that are not followed completely may result in serious medical risk,  up to and including death, or upon the discretion of your surgeon and anesthesiologist your  surgery may need to be rescheduled.     _X__ 1. Do not eat food or drink fluids after midnight the night before your procedure.                 No chewing gum or hard candies.   __X__2.  On the morning of surgery brush your teeth with toothpaste and water, you                may rinse your mouth with mouthwash if you wish.  Do not swallow any toothpaste of mouthwash.     _X__ 3.  No Alcohol for 24 hours before or after surgery.   _X__ 4.  Do Not Smoke or use e-cigarettes For 24 Hours Prior to Your Surgery.                 Do not use any chewable tobacco products for at least 6 hours prior to                 Surgery.  _X__  5.  Do not use any recreational drugs (marijuana, cocaine, heroin, ecstasy, MDMA or other)                For at least one week prior to your surgery.  Combination of these drugs with anesthesia                May have life threatening results.  __X__ 6.  Notify your doctor if there is any change in your medical condition      (cold, fever, infections).     Do not wear jewelry, make-up, hairpins, clips or nail polish. Do not wear lotions, powders, or perfumes. You may wear deodorant. Do not shave 48 hours prior to surgery.  Do not bring valuables to the hospital.    [facility] is not responsible for any belongings or valuables.  Contacts, dentures or bridgework may not be worn into surgery. Leave your suitcase in the car. After surgery it may be brought to your room. For patients admitted to the hospital, discharge time is determined by your treatment  team.   Patients discharged the day of surgery will not be allowed to drive home.   Make arrangements for someone to be with you for the first 24 hours of your Same Day Discharge.   __X__ Take these medicines the morning of surgery with A SIP OF WATER:    1. allopurinol (ZYLOPRIM) 300 MG  2. amLODipine (NORVASC) 10 MG  3. atorvastatin (LIPITOR) 40 MG  4. cloNIDine (CATAPRES) 0.1 MG  5. gabapentin (NEURONTIN) 300 MG   6. pantoprazole (PROTONIX) 40 MG  ____ Fleet Enema (as directed)   ____ Use CHG Soap (or wipes) as directed  ____ Use Benzoyl Peroxide Gel as instructed  __X__ Use inhalers on the day of surgery  PROAIR HFA 108 (90 Base) MCG/ACT inhaler  ____ Stop metformin 2 days prior to surgery    ____ Take 1/2 of usual insulin dose the night before surgery. No insulin the morning          of surgery.   ____  Call your PCP, cardiologist, or Pulmonologist if taking Coumadin/Plavix/aspirin and ask when to stop before your surgery.   __X__ One Week prior to surgery- Stop Anti-inflammatories such as Ibuprofen, Aleve, Advil, Motrin, meloxicam (MOBIC), diclofenac, etodolac, ketorolac, Toradol, Daypro, piroxicam, Goody's or BC powders. OK TO USE TYLENOL IF NEEDED   _X___ Stop supplements until after surgery.    ____ Bring C-Pap to the hospital.    If you have any questions regarding your pre-procedure instructions,  Please call Pre-admit Testing at [phone]

## 2020-10-20 ENCOUNTER — Encounter: Payer: Self-pay | Admitting: Urology

## 2020-10-20 NOTE — Progress Notes (Signed)
Perioperative Services  Pre-Admission/Anesthesia Testing Clinical Review  Date: 10/20/20  Patient Demographics:  Name: [name] DOB:   08-May-1965 MRN:   [id number]  Planned Surgical Procedure(s):    Case: [id number] Date/Time: 10/24/20 1320   Procedure: CYSTOSCOPY/URETEROSCOPY/HOLMIUM LASER/STENT EXCHANGE (Left)   Anesthesia type: General   Pre-op diagnosis: left ureteral stone   Location: [facility] OR ROOM 10 / [place] ORS FOR ANESTHESIA GROUP   Surgeons: Billey Co, MD     NOTE: Available PAT nursing documentation and vital signs have been reviewed. Clinical nursing staff has updated patient's PMH/PSHx, current medication list, and drug allergies/intolerances to ensure comprehensive history available to assist in medical decision making as it pertains to the aforementioned surgical procedure and anticipated anesthetic course. Extensive review of available clinical information performed. [place] PMH and PSHx updated with any diagnoses/procedures that  may have been inadvertently omitted during her intake with the pre-admission testing department's nursing staff.  Clinical Discussion:  [name] is a 55 y.o. female who is submitted for pre-surgical anesthesia review and clearance prior to her undergoing the above procedure. Patient is a Former Smoker (quit 01/2016). Pertinent PMH includes: angina, CHF, PAF, cardiac murmur, tricuspid valve regurgitation, CVA, AAA, HTN, thyroid disease, PE, COPD, OSAH (requires nocturnal PAP therapy), GERD (on daily PPI), polysubstance abuse (cocaine, ETOH), anxiety.  Patient is followed by cardiology Saralyn Pilar, MD). She was last seen in the cardiology clinic on 02/27/2020; notes reviewed.  At the time of her clinic visit, patient doing well overall from a cardiovascular perspective.  She denied any episodes of chest pain, however patient with chronic dyspnea related to her underlying COPD diagnosis; on supplemental oxygen at 2 L.  Patient denied  any episodes of PND, orthopnea, palpitations, significant peripheral edema, vertiginous symptoms, or presyncope/syncope.  PMH significant for cardiovascular diagnoses.  Patient suffered a CVA back in 2010.  Myocardial perfusion imaging study performed on 09/12/2012 revealed a mildly reduced left ventricular systolic function with an EF of 49%.  There was no evidence of significant stress-induced myocardial ischemia or arrhythmia.  Study determined to be normal and low risk.  TTE performed on 03/26/2018 revealed a normal left ventricular systolic function with an EF of 60 to 65%.  Ventricular wall thickness moderately increased.  Diastolic parameters normal.  There was mild to moderate tricuspid valve regurgitation.  There was no evidence of valvular stenosis  Diagnostic left heart catheterization was performed on 03/27/2018 revealing normal left ventricular systolic function.  Coronaries demonstrated normal anatomy with no evidence of significant obstructive atherosclerosis.  Patient with known thoracic aortic aneurysm.  Last imaging performed on 10/28/2019 revealed aneurysmal defect measuring up to 4.6 cm.  Additionally, there was mild dilation of the brachiocephalic artery measuring up to 2.3 cm near the origin.  Patient has been referred to CVTS for further evaluation, however I do not see evidence that patient has been seen in consult as of 10/20/2020.  PMH (+) for PAF; CHA2DS2-VASc Score = 5 (sex, CHF, HTN, previous CVA x 2).  Rate and rhythm controlled without the use of pharmacological intervention.  Patient not currently on daily anticoagulation therapy. Blood pressure elevated at 152/102 despite prescribed CCB, alpha blocker, vasodilator, ACEi, and diuretic therapies.  PMH significant for history of malignant hypertension.  Patient noted that she was not taking her medications as prescribed due to associated side effects; "makes her feel poorly".  Patient is not currently taking any type of  lipid-lowering therapies for ASCVD prevention.  She is not diabetic.  Patient has an OSAH diagnosis; compliant with prescribed nocturnal PAP therapy. Patient remains active, however has no formal exercise regimen. Functional capacity, as defined by DASI, is documented as being >/= 4 METS.  Patient is followed by the heart failure clinic as well.  Given patient's elevated blood pressures, HCTZ was added. No other changes were made to her medication regimen. Repeat TTE order, however to date, as not been performed. Patient was scheduled to follow up with outpatient cardiology in 4 weeks or sooner if needed.  [name] recently found to have a 5 mm ureteral stone on the LEFT. She underwent cystoscopy and stent placement on 10/02/2020. Patient is scheduled for a LEFT CYSTOSCOPY/URETEROSCOPY/HOLMIUM LASER/STENT EXCHANGE on 10/24/2020 with Dr. Nickolas Madrid, MD.  Given patient's past medical history significant for cardiovascular diagnoses, presurgical cardiac clearance was sought by the PAT team. Per cardiology, "this patient is optimized for surgery and may proceed with the planned procedural course with a LOW risk of significant perioperative cardiovascular complications". This patient is on daily antiplatelet therapy. She has been instructed on recommendations for holding her daily low dose ASA for 5 days prior to her procedure with plans to restart as soon as postoperative bleeding risk felt to be minimized by her attending surgeon. The patient has been instructed that her last dose of her anticoagulant will be on 10/18/2020.Marland Kitchen  Patient denies previous perioperative complications with anesthesia in the past. In review of the available records, it is noted that patient underwent a general (LMA) anesthetic course here (ASA III) in 09/2020 without documented complications.   Vitals with BMI 10/15/2020 10/08/2020 10/08/2020  Height $Remov'5\' 10"'oAwUjc$  - -  Weight 214 lbs - 214 lbs 3 oz  BMI 58.09 - 98.33  Systolic - 825  053  Diastolic - 976 734  Pulse - 68 63    Providers/Specialists:   NOTE: Primary physician provider listed below. Patient may have been seen by APP or partner within same practice.   PROVIDER ROLE / SPECIALTY LAST Ranae Pila, MD UROLOGY (SURGEON) 10/02/2020 (ED)  Jon Billings, NP PRIMARY CARE PROVIDER 10/08/2020  Isaias Cowman, MD CARDIOLOGY 02/27/2020   Allergies:  Morphine and related and Penicillins  Current Home Medications:   No current facility-administered medications for this encounter.    allopurinol (ZYLOPRIM) 300 MG tablet   amLODipine (NORVASC) 10 MG tablet   aspirin EC 81 MG tablet   atorvastatin (LIPITOR) 40 MG tablet   cephALEXin (KEFLEX) 500 MG capsule   cloNIDine (CATAPRES) 0.1 MG tablet   colchicine 0.6 MG tablet   furosemide (LASIX) 20 MG tablet   furosemide (LASIX) 40 MG tablet   gabapentin (NEURONTIN) 300 MG capsule   oxybutynin (DITROPAN) 5 MG tablet   pantoprazole (PROTONIX) 40 MG tablet   PROAIR HFA 108 (90 Base) MCG/ACT inhaler   tamsulosin (FLOMAX) 0.4 MG CAPS capsule   tiotropium (SPIRIVA HANDIHALER) 18 MCG inhalation capsule   History:   Past Medical History:  Diagnosis Date   A-fib ([place]) 2010   Adrenal adenoma, left 10/06/2004   a.) CT 10/06/2004 --> measured 3.1 x 1.6 cm   Alcohol abuse    Allergy    Anginal pain (HCC)    Anxiety    Cardiac murmur    CHF (congestive heart failure) (HCC)    COPD (chronic obstructive pulmonary disease) (HCC)    Diverticulosis    Fatty liver    GERD (gastroesophageal reflux disease)    Gout    History of cocaine abuse (  [place])    a.) reports that use discontinued following CVA in 2010.   History of kidney stones    Hypertension    Mild cognitive impairment    Non-healing non-surgical wound    right leg   Obesity    On supplemental oxygen therapy    a.) 2 L/[place]   OSA on CPAP    Pulmonary embolus ([place]) 02/19/2012   a.) CTA --> RIGHT upper lobe.   Stroke [facility]) 2010    Thoracic ascending aortic aneurysm ([place])    a.) CT 10/28/2019 --> measured 4.6 cm.   Thyroid disease    Tricuspid valvular regurgitation    a.) TTE 03/26/2018 --> mild to moderate.   Past Surgical History:  Procedure Laterality Date   CYSTOSCOPY WITH STENT PLACEMENT Left 10/02/2020   Procedure: CYSTOSCOPY WITH STENT PLACEMENT;  Surgeon: Billey Co, MD;  Location: [facility] ORS;  Service: Urology;  Laterality: Left;   LEFT HEART CATH AND CORONARY ANGIOGRAPHY N/A 03/27/2018   Procedure: LEFT HEART CATH AND CORONARY ANGIOGRAPHY;  Surgeon: Isaias Cowman, MD;  Location: [place] CV LAB;  Service: Cardiovascular;  Laterality: N/A;   ORIF ANKLE FRACTURE BIMALLEOLAR Left 09/20/2001   ORIF WRIST FRACTURE Bilateral 2010   Family History  Problem Relation Age of Onset   Prostate cancer Father    Rectal cancer Sister    Breast cancer Sister    Cancer Maternal Grandmother    Cancer Maternal Grandfather    Social History   Tobacco Use   Smoking status: Former    Types: Cigarettes    Quit date: 01/26/2016    Years since quitting: 4.7   Smokeless tobacco: Never  Vaping Use   Vaping Use: Never used  Substance Use Topics   Alcohol use: Yes    Comment: Occassionally   Drug use: No    Pertinent Clinical Results:  LABS: Labs reviewed: Acceptable for surgery.  No visits with results within 3 Day(s) from this visit.  Latest known visit with results is:  Office Visit on 10/08/2020  Component Date Value Ref Range Status   Glucose 10/08/2020 79  65 - 99 mg/dL Final   BUN 10/08/2020 7  6 - 24 mg/dL Final   Creatinine, Ser 10/08/2020 0.65  0.57 - 1.00 mg/dL Final   eGFR 10/08/2020 105  >59 mL/min/1.73 Final   BUN/Creatinine Ratio 10/08/2020 11  9 - 23 Final   Sodium 10/08/2020 141  134 - 144 mmol/L Final   Potassium 10/08/2020 3.9  3.5 - 5.2 mmol/L Final   Chloride 10/08/2020 101  96 - 106 mmol/L Final   CO2 10/08/2020 23  20 - 29 mmol/L Final   Calcium 10/08/2020 9.4  8.7 -  10.2 mg/dL Final   Total Protein 10/08/2020 7.1  6.0 - 8.5 g/dL Final   Albumin 10/08/2020 4.0  3.8 - 4.9 g/dL Final   Globulin, Total 10/08/2020 3.1  1.5 - 4.5 g/dL Final   Albumin/Globulin Ratio 10/08/2020 1.3  1.2 - 2.2 Final   Bilirubin Total 10/08/2020 0.3  0.0 - 1.2 mg/dL Final   Alkaline Phosphatase 10/08/2020 62  44 - 121 IU/L Final   AST 10/08/2020 18  0 - 40 IU/L Final   ALT 10/08/2020 8  0 - 32 IU/L Final   WBC 10/08/2020 5.5  3.4 - 10.8 x10E3/uL Final   Comment: White count and platelets may be decreased due to fibrin strands in the sample submitted.    RBC 10/08/2020 4.76  3.77 - 5.28 x10E6/uL Final  Hemoglobin 10/08/2020 14.3  11.1 - 15.9 g/dL Final   Hematocrit 10/08/2020 42.3  34.0 - 46.6 % Final   MCV 10/08/2020 89  79 - 97 fL Final   MCH 10/08/2020 30.0  26.6 - 33.0 pg Final   MCHC 10/08/2020 33.8  31.5 - 35.7 g/dL Final   RDW 10/08/2020 14.1  11.7 - 15.4 % Final   Platelets 10/08/2020 139 (A) 150 - 450 x10E3/uL Final   Neutrophils 10/08/2020 56  Not Estab. % Final   Lymphs 10/08/2020 31  Not Estab. % Final   Monocytes 10/08/2020 8  Not Estab. % Final   Eos 10/08/2020 3  Not Estab. % Final   Basos 10/08/2020 1  Not Estab. % Final   Neutrophils Absolute 10/08/2020 3.1  1.4 - 7.0 x10E3/uL Final   Lymphocytes Absolute 10/08/2020 1.6  0.7 - 3.1 x10E3/uL Final   Monocytes Absolute 10/08/2020 0.5  0.1 - 0.9 x10E3/uL Final   EOS (ABSOLUTE) 10/08/2020 0.2  0.0 - 0.4 x10E3/uL Final   Basophils Absolute 10/08/2020 0.0  0.0 - 0.2 x10E3/uL Final   Immature Granulocytes 10/08/2020 1  Not Estab. % Final   Immature Grans (Abs) 10/08/2020 0.0  0.0 - 0.1 x10E3/uL Final   Hematology Comments: 10/08/2020 Note:   Final   Verified by microscopic examination.   Trichomonas Exam 10/08/2020 Negative  Negative Final   Yeast Exam 10/08/2020 Negative  Negative Final   Clue Cell Exam 10/08/2020 Positive (A) Negative Final    ECG: Date: 05/29/2020 Time ECG obtained: 1702 PM Rate: 102  bpm Rhythm: sinus tachycardia with PVCs; LVH; prolonged QTc Axis (leads I and aVF): Normal Intervals: PR 149 ms. QRS 89 ms. QTc 522 ms. ST segment and T wave changes: No evidence of acute ST segment elevation or depression Comparison: Similar to previous tracing obtained on 02/19/2020   IMAGING / PROCEDURES: CT ABDOMEN PELVIS WITH CONTRAST performed on 10/02/2020 LEFT hydroureteronephrosis to the level of a 5 mm stone in the proximal ureter with an edematous LEFT kidney and periureteric/perinephric stranding.  Recommend correlation with laboratory values to exclude superimposed infection. Punctate 1 mm nonobstructing RIGHT renal stone. Mild wall thickening of an incompletely distended urinary bladder.  Correlate with urinalysis to exclude cystitis. Colonic diverticulosis without findings of acute diverticulitis. Diffuse hepatic steatosis. Stable 2.9 cm LEFT adrenal adenoma.  LEFT HEART CATHETERIZATION AND CORONARY ANGIOGRAPHY performed on 03/26/2020 Normal LVEF Normal coronary anatomy with no evidence of significant coronary artery atherosclerosis  CTA CHEST performed on 10/28/2019 No evidence of aortic dissection or intramural hematoma Dilation of the aortic root and ascending aorta measuring up to 4.6 cm at the sinuses of Valsalva and 4.4 cm at the tubular ascending aorta Mild dilation of the brachiocephalic artery measuring up to 2.3 cm near the origin Severe compression deformities of T3 and T7 of indeterminate age; recommend correlation for point tenderness  TRANSTHORACIC ECHOCARDIOGRAM performed on 03/26/2018 The left ventricle has normal systolic function with an ejection fraction of 60-65%. The cavity size was normal. There is moderately  increased left ventricular wall thickness. Left ventricular diastolic parameters were normal.  The right ventricle has normal systolic function. The cavity was normal. There is no increase in right ventricular wall thickness.  The mitral valve  is normal in structure.  The tricuspid valve is normal in structure. Tricuspid valve regurgitation is mild-moderate.  The aortic valve is normal in structure.  The pulmonic valve was normal in structure.   Impression and Plan:  [name] has been referred  for pre-anesthesia review and clearance prior to her undergoing the planned anesthetic and procedural courses. Available labs, pertinent testing, and imaging results were personally reviewed by me. This patient has been appropriately cleared by cardiology with an overall LOW risk of significant perioperative cardiovascular complications.  Based on clinical review performed today (10/20/20), barring any significant acute changes in the patient's overall condition, it is anticipated that she will be able to proceed with the planned surgical intervention. Any acute changes in clinical condition may necessitate her procedure being postponed and/or cancelled. Patient will meet with anesthesia team (MD and/or CRNA) on the day of her procedure for preoperative evaluation/assessment. Questions regarding anesthetic course will be fielded at that time.   Pre-surgical instructions were reviewed with the patient during her PAT appointment and questions were fielded by PAT clinical staff. Patient was advised that if any questions or concerns arise prior to her procedure then she should return a call to PAT and/or her surgeon's office to discuss.  Betty Loh, MSN, APRN, FNP-C, CEN [facility]  Peri-operative Services Nurse Practitioner Phone: [phone] Fax: [phone] 10/20/20 12:01 PM  NOTE: This note has been prepared using Dragon dictation software. Despite my best ability to proofread, there is always the potential that unintentional transcriptional errors may still occur from this process.

## 2020-10-24 ENCOUNTER — Other Ambulatory Visit: Payer: Self-pay

## 2020-10-24 ENCOUNTER — Ambulatory Visit: Payer: Medicaid Other | Admitting: Urgent Care

## 2020-10-24 ENCOUNTER — Ambulatory Visit
Admission: RE | Admit: 2020-10-24 | Discharge: 2020-10-24 | Disposition: A | Discharge status: Home / Self Care | Payer: Medicaid Other | Attending: Urology | Admitting: Urology

## 2020-10-24 ENCOUNTER — Ambulatory Visit: Payer: Medicaid Other

## 2020-10-24 ENCOUNTER — Encounter: Payer: Self-pay | Admitting: Urology

## 2020-10-24 ENCOUNTER — Encounter
Admission: RE | Disposition: A | Discharge status: Home / Self Care | Payer: Self-pay | Source: Home / Self Care | Attending: Urology

## 2020-10-24 DIAGNOSIS — J449 Chronic obstructive pulmonary disease, unspecified: Secondary | ICD-10-CM | POA: Diagnosis not present

## 2020-10-24 DIAGNOSIS — Z86711 Personal history of pulmonary embolism: Secondary | ICD-10-CM | POA: Diagnosis not present

## 2020-10-24 DIAGNOSIS — E669 Obesity, unspecified: Secondary | ICD-10-CM | POA: Diagnosis not present

## 2020-10-24 DIAGNOSIS — Z87891 Personal history of nicotine dependence: Secondary | ICD-10-CM | POA: Insufficient documentation

## 2020-10-24 DIAGNOSIS — N201 Calculus of ureter: Secondary | ICD-10-CM | POA: Diagnosis not present

## 2020-10-24 DIAGNOSIS — I509 Heart failure, unspecified: Secondary | ICD-10-CM | POA: Insufficient documentation

## 2020-10-24 DIAGNOSIS — Z9981 Dependence on supplemental oxygen: Secondary | ICD-10-CM | POA: Insufficient documentation

## 2020-10-24 DIAGNOSIS — Z87440 Personal history of urinary (tract) infections: Secondary | ICD-10-CM | POA: Insufficient documentation

## 2020-10-24 DIAGNOSIS — Z6830 Body mass index (BMI) 30.0-30.9, adult: Secondary | ICD-10-CM | POA: Insufficient documentation

## 2020-10-24 DIAGNOSIS — I110 Hypertensive heart disease with heart failure: Secondary | ICD-10-CM | POA: Diagnosis not present

## 2020-10-24 DIAGNOSIS — Z86018 Personal history of other benign neoplasm: Secondary | ICD-10-CM | POA: Diagnosis not present

## 2020-10-24 DIAGNOSIS — Z885 Allergy status to narcotic agent status: Secondary | ICD-10-CM | POA: Diagnosis not present

## 2020-10-24 DIAGNOSIS — Z880 Allergy status to penicillin: Secondary | ICD-10-CM | POA: Diagnosis not present

## 2020-10-24 DIAGNOSIS — I4891 Unspecified atrial fibrillation: Secondary | ICD-10-CM | POA: Diagnosis not present

## 2020-10-24 HISTORY — DX: Rheumatic tricuspid insufficiency: I07.1

## 2020-10-24 HISTORY — DX: Diverticulosis of intestine, part unspecified, without perforation or abscess without bleeding: K57.90

## 2020-10-24 HISTORY — DX: Cardiac murmur, unspecified: R01.1

## 2020-10-24 HISTORY — DX: Dependence on supplemental oxygen: Z99.81

## 2020-10-24 HISTORY — DX: Cocaine abuse, in remission: F14.11

## 2020-10-24 HISTORY — DX: Thoracic aortic aneurysm, without rupture: I71.2

## 2020-10-24 HISTORY — DX: Obstructive sleep apnea (adult) (pediatric): G47.33

## 2020-10-24 HISTORY — DX: Angina pectoris, unspecified: I20.9

## 2020-10-24 HISTORY — DX: Aneurysm of the ascending aorta, without rupture: I71.21

## 2020-10-24 HISTORY — PX: CYSTOSCOPY/URETEROSCOPY/HOLMIUM LASER/STENT PLACEMENT: SHX6546

## 2020-10-24 LAB — POCT I-STAT, CHEM 8
BUN: 21 mg/dL — ABNORMAL HIGH (ref 6–20)
Calcium, Ion: 1.14 mmol/L — ABNORMAL LOW (ref 1.15–1.40)
Chloride: 105 mmol/L (ref 98–111)
Creatinine, Ser: 0.8 mg/dL (ref 0.44–1.00)
Glucose, Bld: 106 mg/dL — ABNORMAL HIGH (ref 70–99)
HCT: 42 % (ref 36.0–46.0)
Hemoglobin: 14.3 g/dL (ref 12.0–15.0)
Potassium: 3.6 mmol/L (ref 3.5–5.1)
Sodium: 143 mmol/L (ref 135–145)
TCO2: 28 mmol/L (ref 22–32)

## 2020-10-24 SURGERY — CYSTOSCOPY/URETEROSCOPY/HOLMIUM LASER/STENT PLACEMENT
Anesthesia: General | Site: Ureter | Laterality: Left

## 2020-10-24 MED ORDER — NITROFURANTOIN MONOHYD MACRO 100 MG PO CAPS: 100.0000 mg | ORAL_CAPSULE | Freq: Every day | ORAL | 0 refills | Status: DC

## 2020-10-24 MED ORDER — DEXAMETHASONE SODIUM PHOSPHATE 10 MG/ML IJ SOLN
INTRAMUSCULAR | Status: DC | PRN
Start: 2020-10-24 — End: 2020-10-24
  Administered 2020-10-24: 10 mg via INTRAVENOUS

## 2020-10-24 MED ORDER — HYDRALAZINE HCL 20 MG/ML IJ SOLN: 10.0000 mg | Freq: Once | INTRAMUSCULAR | Status: AC

## 2020-10-24 MED ORDER — SUGAMMADEX SODIUM 200 MG/2ML IV SOLN
INTRAVENOUS | Status: DC | PRN
  Administered 2020-10-24 (×2): 200 mg via INTRAVENOUS

## 2020-10-24 MED ORDER — HYDRALAZINE HCL 20 MG/ML IJ SOLN
INTRAMUSCULAR | Status: AC
  Administered 2020-10-24: 10 mg via INTRAVENOUS
  Filled 2020-10-24: qty 1

## 2020-10-24 MED ORDER — LIDOCAINE HCL (PF) 2 % IJ SOLN
INTRAMUSCULAR | Status: AC
  Filled 2020-10-24: qty 5

## 2020-10-24 MED ORDER — MIDAZOLAM HCL 2 MG/2ML IJ SOLN
INTRAMUSCULAR | Status: AC
  Filled 2020-10-24: qty 2

## 2020-10-24 MED ORDER — ROCURONIUM BROMIDE 10 MG/ML (PF) SYRINGE
PREFILLED_SYRINGE | INTRAVENOUS | Status: AC
  Filled 2020-10-24: qty 10

## 2020-10-24 MED ORDER — ROCURONIUM BROMIDE 100 MG/10ML IV SOLN
INTRAVENOUS | Status: DC | PRN
  Administered 2020-10-24: 30 mg via INTRAVENOUS

## 2020-10-24 MED ORDER — PROPOFOL 10 MG/ML IV BOLUS
INTRAVENOUS | Status: DC | PRN
  Administered 2020-10-24: 110 mg via INTRAVENOUS

## 2020-10-24 MED ORDER — FENTANYL CITRATE (PF) 100 MCG/2ML IJ SOLN: 25.0000 ug | INTRAMUSCULAR | Status: DC | PRN

## 2020-10-24 MED ORDER — CHLORHEXIDINE GLUCONATE 0.12 % MT SOLN
OROMUCOSAL | Status: AC
  Administered 2020-10-24: 15 mL via OROMUCOSAL
  Filled 2020-10-24: qty 15

## 2020-10-24 MED ORDER — LACTATED RINGERS IV SOLN: INTRAVENOUS | Status: DC

## 2020-10-24 MED ORDER — LIDOCAINE HCL (CARDIAC) PF 100 MG/5ML IV SOSY
PREFILLED_SYRINGE | INTRAVENOUS | Status: DC | PRN
  Administered 2020-10-24: 100 mg via INTRAVENOUS

## 2020-10-24 MED ORDER — IOHEXOL 180 MG/ML  SOLN
INTRAMUSCULAR | Status: DC | PRN
  Administered 2020-10-24: 5 mL

## 2020-10-24 MED ORDER — ONDANSETRON HCL 4 MG/2ML IJ SOLN
INTRAMUSCULAR | Status: DC | PRN
  Administered 2020-10-24: 4 mg via INTRAVENOUS

## 2020-10-24 MED ORDER — CEFAZOLIN SODIUM-DEXTROSE 2-4 GM/100ML-% IV SOLN
2.0000 g | INTRAVENOUS | Status: AC
  Administered 2020-10-24: 2 g via INTRAVENOUS

## 2020-10-24 MED ORDER — CHLORHEXIDINE GLUCONATE 0.12 % MT SOLN
15.0000 mL | Freq: Once | OROMUCOSAL | Status: AC
Start: 2020-10-24 — End: 2020-10-24

## 2020-10-24 MED ORDER — FENTANYL CITRATE (PF) 100 MCG/2ML IJ SOLN
INTRAMUSCULAR | Status: AC
  Filled 2020-10-24: qty 2

## 2020-10-24 MED ORDER — DEXAMETHASONE SODIUM PHOSPHATE 10 MG/ML IJ SOLN
INTRAMUSCULAR | Status: AC
  Filled 2020-10-24: qty 1

## 2020-10-24 MED ORDER — CEFAZOLIN SODIUM-DEXTROSE 2-4 GM/100ML-% IV SOLN
INTRAVENOUS | Status: AC
  Filled 2020-10-24: qty 100

## 2020-10-24 MED ORDER — ONDANSETRON HCL 4 MG/2ML IJ SOLN
INTRAMUSCULAR | Status: AC
  Filled 2020-10-24: qty 2

## 2020-10-24 MED ORDER — MIDAZOLAM HCL 2 MG/2ML IJ SOLN
INTRAMUSCULAR | Status: DC | PRN
  Administered 2020-10-24: 2 mg via INTRAVENOUS

## 2020-10-24 MED ORDER — SODIUM CHLORIDE 0.9 % IR SOLN
Status: DC | PRN
  Administered 2020-10-24: 1500 mL

## 2020-10-24 MED ORDER — DEXMEDETOMIDINE (PRECEDEX) IN NS 20 MCG/5ML (4 MCG/ML) IV SYRINGE
PREFILLED_SYRINGE | INTRAVENOUS | Status: AC
  Filled 2020-10-24: qty 5

## 2020-10-24 MED ORDER — FENTANYL CITRATE (PF) 100 MCG/2ML IJ SOLN
INTRAMUSCULAR | Status: DC | PRN
  Administered 2020-10-24: 50 ug via INTRAVENOUS
  Administered 2020-10-24 (×2): 25 ug via INTRAVENOUS

## 2020-10-24 MED ORDER — ORAL CARE MOUTH RINSE: 15.0000 mL | Freq: Once | OROMUCOSAL | Status: AC

## 2020-10-24 SURGICAL SUPPLY — 33 items
ADH LQ OCL WTPRF AMP STRL LF (MISCELLANEOUS) ×1
ADHESIVE MASTISOL STRL (MISCELLANEOUS) ×2 IMPLANT
BAG DRAIN CYSTO-URO LG1000N (MISCELLANEOUS) ×2 IMPLANT
BRUSH SCRUB EZ 1% IODOPHOR (MISCELLANEOUS) ×2 IMPLANT
CATH URET FLEX-TIP 2 LUMEN 10F (CATHETERS) IMPLANT
CATH URETL OPEN 5X70 (CATHETERS) IMPLANT
CNTNR SPEC 2.5X3XGRAD LEK (MISCELLANEOUS)
CONT SPEC 4OZ STER OR WHT (MISCELLANEOUS)
CONT SPEC 4OZ STRL OR WHT (MISCELLANEOUS)
CONTAINER SPEC 2.5X3XGRAD LEK (MISCELLANEOUS) IMPLANT
DRAPE UTILITY 15X26 TOWEL STRL (DRAPES) ×2 IMPLANT
DRSG TEGADERM 2-3/8X2-3/4 SM (GAUZE/BANDAGES/DRESSINGS) ×2 IMPLANT
GAUZE 4X4 16PLY ~~LOC~~+RFID DBL (SPONGE) ×4 IMPLANT
GLOVE SURG UNDER POLY LF SZ7.5 (GLOVE) ×2 IMPLANT
GOWN STRL REUS W/ TWL LRG LVL3 (GOWN DISPOSABLE) ×1 IMPLANT
GOWN STRL REUS W/ TWL XL LVL3 (GOWN DISPOSABLE) ×1 IMPLANT
GOWN STRL REUS W/TWL LRG LVL3 (GOWN DISPOSABLE) ×2
GOWN STRL REUS W/TWL XL LVL3 (GOWN DISPOSABLE) ×2
GUIDEWIRE STR DUAL SENSOR (WIRE) ×2 IMPLANT
INFUSOR MANOMETER BAG 3000ML (MISCELLANEOUS) ×2 IMPLANT
IV NS IRRIG 3000ML ARTHROMATIC (IV SOLUTION) ×2 IMPLANT
KIT TURNOVER CYSTO (KITS) ×2 IMPLANT
PACK CYSTO AR (MISCELLANEOUS) ×2 IMPLANT
SET CYSTO W/LG BORE CLAMP LF (SET/KITS/TRAYS/PACK) ×2 IMPLANT
SHEATH URETERAL 12FRX35CM (MISCELLANEOUS) IMPLANT
STENT URET 6FRX24 CONTOUR (STENTS) IMPLANT
STENT URET 6FRX26 CONTOUR (STENTS) ×2 IMPLANT
SURGILUBE 2OZ TUBE FLIPTOP (MISCELLANEOUS) ×2 IMPLANT
SYR 10ML LL (SYRINGE) ×2 IMPLANT
TRACTIP FLEXIVA PULSE ID 200 (Laser) ×2 IMPLANT
VALVE UROSEAL ADJ ENDO (VALVE) IMPLANT
WATER STERILE IRR 1000ML POUR (IV SOLUTION) ×2 IMPLANT
WATER STERILE IRR 500ML POUR (IV SOLUTION) ×2 IMPLANT

## 2020-10-24 NOTE — Transfer of Care (Signed)
Immediate Anesthesia Transfer of Care Note  Patient: [name]  Procedure(s) Performed: CYSTOSCOPY/URETEROSCOPY/HOLMIUM LASER/STENT EXCHANGE (Left: Ureter)  Patient Location: PACU  Anesthesia Type:General  Level of Consciousness: alert   Airway & Oxygen Therapy: Patient Spontanous Breathing  Post-op Assessment: Report given to RN  Post vital signs: stable  Last Vitals:  Vitals Value Taken Time  BP    Temp    Pulse 71 10/24/20 1613  Resp 21 10/24/20 1613  SpO2 97 % 10/24/20 1613  Vitals shown include unvalidated device data.  Last Pain:  Vitals:   10/24/20 0953  TempSrc: Temporal  PainSc: 0-No pain         Complications: No notable events documented.

## 2020-10-24 NOTE — Anesthesia Postprocedure Evaluation (Signed)
Anesthesia Post Note  Patient: [name]  Procedure(s) Performed: CYSTOSCOPY/URETEROSCOPY/HOLMIUM LASER/STENT EXCHANGE (Left: Ureter)  Patient location during evaluation: PACU Anesthesia Type: General Level of consciousness: awake and alert Pain management: pain level controlled Vital Signs Assessment: post-procedure vital signs reviewed and stable Respiratory status: spontaneous breathing, nonlabored ventilation, respiratory function stable and patient connected to nasal cannula oxygen Cardiovascular status: blood pressure returned to baseline and stable Postop Assessment: no apparent nausea or vomiting Anesthetic complications: no   No notable events documented.   Last Vitals:  Vitals:   10/24/20 1715 10/24/20 1729  BP: (!) 144/94 (!) 155/103  Pulse: 82 86  Resp: 20   Temp: 36.8 C (!) 36.1 C  SpO2: 96% 94%    Last Pain:  Vitals:   10/24/20 1729  TempSrc: Temporal  PainSc: 5                  [name]

## 2020-10-24 NOTE — Op Note (Signed)
Date of procedure: 10/24/20  Preoperative diagnosis:  Left proximal ureteral stone Left ureteral stone  Postoperative diagnosis:  Same  Procedure: Left ureteroscopy and laser lithotripsy of proximal ureteral stone Left ureteroscopy and laser lithotripsy of left lower pole renal stone Cystoscopy, left retrograde pyelogram with intraoperative interpretation, left ureteral stent placement  Surgeon: Nickolas Madrid, MD  Anesthesia: General  Complications: None  Intraoperative findings:  Normal cystoscopy, uncomplicated dusting of left proximal ureteral stone and small left lower pole stone  EBL: None  Specimens: None  Drains: Left 6 French by 26 cm ureteral stent with Dangler  Indication: [name] is a 55 y.o. patient with 5 mm left proximal ureteral stone who was previously stented for UTI.  After reviewing the management options for treatment, they elected to proceed with the above surgical procedure(s). We have discussed the potential benefits and risks of the procedure, side effects of the proposed treatment, the likelihood of the patient achieving the goals of the procedure, and any potential problems that might occur during the procedure or recuperation. Informed consent has been obtained.  Description of procedure:  The patient was taken to the operating room and general anesthesia was induced. SCDs were placed for DVT prophylaxis.. The patient was placed in the dorsal lithotomy position, prepped and draped in the usual sterile fashion, and preoperative antibiotics(Ancef) were administered. A preoperative time-out was performed.   A 21 French rigid cystoscope was used to intubate the urethra and thorough cystoscopy was performed.  The bladder was grossly normal.  The left ureteral stent was grasped and pulled to the meatus and a sensor wire advanced up into the kidney.  A single channel digital flexible ureteroscope advanced easily over the wire up to the level of the  proximal stone.  Under direct vision there is a yellow 5 mm stone in the proximal ureter.  A 240 m laser fiber was used on settings of 0.3 J and 60 Hz to methodically dust the stone into <68mm fragments.    The ureteroscope was advanced up into the kidney and thorough pyeloscopy revealed a small 3 mm stone in the left lower pole, and this was dusted on previously mentioned settings.  No other significant stone fragments remained.  Pullback ureteroscopy showed no abnormalities, retrograde pyelogram from the proximal ureter showed no extravasation or filling defects.  A sensor wire was replaced through the scope and a 6 [place] by 26 cm ureteral stent with Dangler attached was fluoroscopically placed with an excellent curl in the kidney, as well as in the bladder.  The bladder was drained, and the Dangler was secured to the right inner thigh with Mastisol and Tegaderm.  Disposition: Stable to PACU  Plan: Nitrofurantoin prophylaxis with stent in place Stent can be removed Tuesday  Nickolas Madrid, MD

## 2020-10-24 NOTE — Anesthesia Preprocedure Evaluation (Signed)
Anesthesia Evaluation  Patient identified by MRN, date of birth, ID band Patient awake    Reviewed: Allergy & Precautions, NPO status , Patient's Chart, lab work & pertinent test results  History of Anesthesia Complications Negative for: history of anesthetic complications  Airway Mallampati: III  TM Distance: >3 FB Neck ROM: limited    Dental  (+) Chipped, Poor Dentition, Missing   Pulmonary sleep apnea, Continuous Positive Airway Pressure Ventilation and Oxygen sleep apnea , COPD,  COPD inhaler and oxygen dependent, former smoker,    Pulmonary exam normal        Cardiovascular Exercise Tolerance: Good hypertension, (-) angina+CHF  (-) Past MI Normal cardiovascular exam+ Valvular Problems/Murmurs      Neuro/Psych PSYCHIATRIC DISORDERS CVA    GI/Hepatic Neg liver ROS, GERD  Medicated and Controlled,  Endo/Other  negative endocrine ROS  Renal/GU      Musculoskeletal   Abdominal   Peds  Hematology negative hematology ROS (+)   Anesthesia Other Findings Past Medical History: 2010: A-fib ([place]) 10/06/2004: Adrenal adenoma, left     Comment:  a.) CT 10/06/2004 --> measured 3.1 x 1.6 cm No date: Alcohol abuse No date: Allergy No date: Anginal pain ([place]) No date: Anxiety No date: Cardiac murmur No date: CHF (congestive heart failure) (HCC) No date: COPD (chronic obstructive pulmonary disease) (HCC) No date: Diverticulosis No date: Fatty liver No date: GERD (gastroesophageal reflux disease) No date: Gout No date: History of cocaine abuse ([place])     Comment:  a.) reports that use discontinued following CVA in 2010. No date: History of kidney stones No date: Hypertension No date: Mild cognitive impairment No date: Non-healing non-surgical wound     Comment:  right leg No date: Obesity No date: On supplemental oxygen therapy     Comment:  a.) 2 L/[place] No date: OSA on CPAP 02/19/2012: Pulmonary embolus (HCC)      Comment:  a.) CTA --> RIGHT upper lobe. 2010: Stroke [facility]) No date: Thoracic ascending aortic aneurysm ([place])     Comment:  a.) CT 10/28/2019 --> measured 4.6 cm. No date: Thyroid disease No date: Tricuspid valvular regurgitation     Comment:  a.) TTE 03/26/2018 --> mild to moderate.  Past Surgical History: 10/02/2020: CYSTOSCOPY WITH STENT PLACEMENT; Left     Comment:  Procedure: CYSTOSCOPY WITH STENT PLACEMENT;  Surgeon:               Billey Co, MD;  Location: [facility] ORS;  Service:               Urology;  Laterality: Left; 03/27/2018: LEFT HEART CATH AND CORONARY ANGIOGRAPHY; N/A     Comment:  Procedure: LEFT HEART CATH AND CORONARY ANGIOGRAPHY;                Surgeon: Isaias Cowman, MD;  Location: [facility];  Service: Cardiovascular;  Laterality:               N/A; 09/20/2001: ORIF ANKLE FRACTURE BIMALLEOLAR; Left 2010: ORIF WRIST FRACTURE; Bilateral  BMI    Body Mass Index: 30.72 kg/m      Reproductive/Obstetrics negative OB ROS                             Anesthesia Physical Anesthesia Plan  ASA: 3  Anesthesia Plan: General ETT   Post-op Pain Management:  Induction: Intravenous  PONV Risk Score and Plan: Ondansetron, Dexamethasone, Midazolam and Treatment may vary due to age or medical condition  Airway Management Planned: Oral ETT  Additional Equipment:   Intra-op Plan:   Post-operative Plan: Extubation in OR  Informed Consent: I have reviewed the patients History and Physical, chart, labs and discussed the procedure including the risks, benefits and alternatives for the proposed anesthesia with the patient or authorized representative who has indicated his/her understanding and acceptance.     Dental Advisory Given  Plan Discussed with: Anesthesiologist, CRNA and Surgeon  Anesthesia Plan Comments: (Patient consented for risks of anesthesia including but not limited to:  - adverse reactions  to medications - damage to eyes, teeth, lips or other oral mucosa - nerve damage due to positioning  - sore throat or hoarseness - Damage to heart, brain, nerves, lungs, other parts of body or loss of life  Patient voiced understanding.)        Anesthesia Quick Evaluation

## 2020-10-24 NOTE — Anesthesia Procedure Notes (Signed)
Procedure Name: Intubation Date/Time: 10/24/2020 3:36 PM Performed by: Kerri Perches, CRNA Pre-anesthesia Checklist: Patient identified, Patient being monitored, Timeout performed, Emergency Drugs available and Suction available Patient Re-evaluated:Patient Re-evaluated prior to induction Oxygen Delivery Method: Circle system utilized Preoxygenation: Pre-oxygenation with 100% oxygen Induction Type: IV induction Ventilation: Mask ventilation without difficulty Laryngoscope Size: 3 and McGraph Grade View: Grade I Tube type: Oral Tube size: 7.0 mm Number of attempts: 1 Airway Equipment and Method: Stylet and Patient positioned with wedge pillow Placement Confirmation: ETT inserted through vocal cords under direct vision, positive ETCO2 and breath sounds checked- equal and bilateral Secured at: 21 cm Tube secured with: Tape Dental Injury: Teeth and Oropharynx as per pre-operative assessment

## 2020-10-24 NOTE — Progress Notes (Signed)
Patient blood pressure 155/103, Dr. Bertell Maria notified. Patient may be discharged home, patient instructed to return to ED if patient develops chest pain, blurry or double vision, shortness of breath, or any new symptoms. Patient verbalized understanding.

## 2020-10-24 NOTE — Discharge Instructions (Signed)

## 2020-10-24 NOTE — H&P (Signed)
10/24/20 2:05 PM   [name] 22-Oct-1965 [id number]  CC: Left ureteral stone  HPI: 55 year old female with a number of comorbidities who previously presented with an infected 5 mm left proximal ureteral stone and underwent urgent stent placement.  She presents today for definitive management and stone removal  PMH: Past Medical History:  Diagnosis Date   A-fib [facility]) 2010   Adrenal adenoma, left 10/06/2004   a.) CT 10/06/2004 --> measured 3.1 x 1.6 cm   Alcohol abuse    Allergy    Anginal pain (HCC)    Anxiety    Cardiac murmur    CHF (congestive heart failure) (HCC)    COPD (chronic obstructive pulmonary disease) ([place])    Diverticulosis    Fatty liver    GERD (gastroesophageal reflux disease)    Gout    History of cocaine abuse ([place])    a.) reports that use discontinued following CVA in 2010.   History of kidney stones    Hypertension    Mild cognitive impairment    Non-healing non-surgical wound    right leg   Obesity    On supplemental oxygen therapy    a.) 2 L/[place]   OSA on CPAP    Pulmonary embolus ([place]) 02/19/2012   a.) CTA --> RIGHT upper lobe.   Stroke [facility]) 2010   Thoracic ascending aortic aneurysm ([place])    a.) CT 10/28/2019 --> measured 4.6 cm.   Thyroid disease    Tricuspid valvular regurgitation    a.) TTE 03/26/2018 --> mild to moderate.    Surgical History: Past Surgical History:  Procedure Laterality Date   CYSTOSCOPY WITH STENT PLACEMENT Left 10/02/2020   Procedure: CYSTOSCOPY WITH STENT PLACEMENT;  Surgeon: Billey Co, MD;  Location: [facility] OR[facility];  Service: Urology;  Laterality: Left;   LEFT HEART CATH AND CORONARY ANGIOGRAPHY N/A 03/27/2018   Procedure: LEFT HEART CATH AND CORONARY ANGIOGRAPHY;  Surgeon: Isaias Cowman, MD;  Location: [facility];  Service: Cardiovascular;  Laterality: N/A;   ORIF ANKLE FRACTURE BIMALLEOLAR Left 09/20/2001   ORIF WRIST FRACTURE Bilateral 2010    Family History: Family History   Problem Relation Age of Onset   Prostate cancer Father    Rectal cancer Sister    Breast cancer Sister    Cancer Maternal Grandmother    Cancer Maternal Grandfather     Social History:  reports that she quit smoking about 4 years ago. Her smoking use included cigarettes. She has never used smokeless tobacco. She reports current alcohol use. She reports that she does not use drugs.  Physical Exam: BP (!) 148/111   Pulse 64   Temp 97.7 F (36.5 C) (Temporal)   Resp 20   Ht 5\' 10"  (1.778 m)   Wt 97.1 kg   SpO2 96%   BMI 30.72 kg/m    Constitutional:  Alert and oriented, No acute distress. Cardiovascular: Regular rate and rhythm Respiratory: Clear to auscultation bilaterally GI: Abdomen is soft, nontender, nondistended, no abdominal masses   Assessment & Plan:   55 year old comorbid female previously stented for a 5 mm left proximal ureteral stone with acute infection, here today for definitive management with left ureteroscopy, laser lithotripsy, stent placement  We specifically discussed the risks ureteroscopy including bleeding, infection/sepsis, stent related symptoms including flank pain/urgency/frequency/incontinence/dysuria, ureteral injury, inability to access stone, or need for staged or additional procedures.   Betty Madrid, MD 10/24/2020  [facility] [address], [place], [place] [address] [phone]  [phone]   

## 2020-10-29 ENCOUNTER — Telehealth: Payer: Self-pay | Admitting: Nurse Practitioner

## 2020-10-29 NOTE — Telephone Encounter (Signed)
Patient called, left VM to return the call to the office to speak to a nurse about refill request. Patient will need to contact the urologist office for refill, since that is who prescribed it.

## 2020-10-29 NOTE — Telephone Encounter (Signed)
Medication Refill - Medication: nitrofurantoin, macrocrystal-monohydrate, (MACROBID) 100 MG capsule  Has the patient contacted their pharmacy? yes (Agent: If no, request that the patient contact the pharmacy for the refill.) (Agent: If yes, when and what did the pharmacy advise?)contact pcp  [facility]erred Pharm[facility] (with phone number [facility]  [address], [place], [place] [address] Has the patient been seen for an appointment in the last year OR does the patient have an upcoming appointment? yes  Agent: Please be advised that RX refills may take up to 3 business days. We ask that you follow-up with your pharmacy.

## 2020-10-31 ENCOUNTER — Telehealth: Payer: Self-pay | Admitting: Urology

## 2020-10-31 NOTE — Telephone Encounter (Signed)
Pt [facility] which has closed down and would like RX sent to [facility] in [place].

## 2020-10-31 NOTE — Telephone Encounter (Signed)
Called pt, no answer. LM for pt informing her that RX was only for four days and should be completed at this point, therefore RX would not be sent to [facility].

## 2020-11-11 NOTE — Progress Notes (Addendum)
BP (!) 147/104 Comment: took medication at 530 am  Pulse 73   Ht 5\' 10"  (1.778 m)   Wt 222 lb (100.7 kg)   BMI 31.85 kg/m    Subjective:    Patient ID: [name], female    DOB: 03-Jun-1965, 55 y.o.   MRN: [id number]  HPI: [name] is a 55 y.o. female  Chief Complaint  Patient presents with   Hypertension   HYPERTENSION Patient presents to clinic to follow up on blood pressure.  During her hospitalization at the beginning of September all of her blood pressure medications were stopped. During her last visit blood pressure was elevated.  We restarted her clonidine, lasix and amlodipine.  At visit today blood pressure remains elevated.  Patient has not seen HF clinic.  Denies HA, CP, SOB, dizziness, palpitations, visual changes, and lower extremity swelling.  Patient was supposed to have received macrobid following her recent hospitalization.  She had not received it.    Relevant past medical, surgical, family and social history reviewed and updated as indicated. Interim medical history since our last visit reviewed. Allergies and medications reviewed and updated.  Review of Systems  Eyes:  Negative for visual disturbance.  Respiratory:  Negative for cough, chest tightness and shortness of breath.   Cardiovascular:  Negative for chest pain, palpitations and leg swelling.  Neurological:  Negative for dizziness and headaches.   Per HPI unless specifically indicated above     Objective:    BP (!) 147/104 Comment: took medication at 530 am  Pulse 73   Ht 5\' 10"  (1.778 m)   Wt 222 lb (100.7 kg)   BMI 31.85 kg/m   Wt Readings from Last 3 Encounters:  11/12/20 222 lb (100.7 kg)  10/24/20 214 lb 1.1 oz (97.1 kg)  10/15/20 214 lb (97.1 kg)    Physical Exam Vitals and nursing note reviewed.  Constitutional:      General: She is not in acute distress.    Appearance: Normal appearance. She is normal weight. She is not ill-appearing, toxic-appearing or diaphoretic.   HENT:     Head: Normocephalic.     Right Ear: External ear normal.     Left Ear: External ear normal.     Nose: Nose normal.     Mouth/Throat:     Mouth: Mucous membranes are moist.     Pharynx: Oropharynx is clear.  Eyes:     General:        Right eye: No discharge.        Left eye: No discharge.     Extraocular Movements: Extraocular movements intact.     Conjunctiva/sclera: Conjunctivae normal.     Pupils: Pupils are equal, round, and reactive to light.  Cardiovascular:     Rate and Rhythm: Normal rate and regular rhythm.     Heart sounds: No murmur heard. Pulmonary:     Effort: Pulmonary effort is normal. No respiratory distress.     Breath sounds: Normal breath sounds. No wheezing or rales.  Musculoskeletal:     Cervical back: Normal range of motion and neck supple.  Skin:    General: Skin is warm and dry.     Capillary Refill: Capillary refill takes less than 2 seconds.  Neurological:     General: No focal deficit present.     Mental Status: She is alert and oriented to person, place, and time. Mental status is at baseline.  Psychiatric:  Mood and Affect: Mood normal.        Behavior: Behavior normal.        Thought Content: Thought content normal.        Judgment: Judgment normal.    Results for orders placed or performed in visit on 11/12/20  Microscopic Examination   Urine  Result Value Ref Range   WBC, UA 0-5 0 - 5 /hpf   RBC 0-2 0 - 2 /hpf   Epithelial Cells (non renal) 0-10 0 - 10 /hpf   Bacteria, UA Many (A) None seen/Few  Urinalysis, Routine w reflex microscopic  Result Value Ref Range   Specific Gravity, UA 1.020 1.005 - 1.030   pH, UA 6.5 5.0 - 7.5   Color, UA Yellow Yellow   Appearance Ur Cloudy (A) Clear   Leukocytes,UA 1+ (A) Negative   Protein,UA Trace (A) Negative/Trace   Glucose, UA Negative Negative   Ketones, UA Negative Negative   RBC, UA Trace (A) Negative   Bilirubin, UA Negative Negative   Urobilinogen, Ur 1.0 0.2 - 1.0 mg/dL    Nitrite, UA Positive (A) Negative   Microscopic Examination See below:       Assessment & Plan:   Problem List Items Addressed This Visit       Cardiovascular and Mediastinum   Essential hypertension - Primary (Chronic)    Chronic. Not well controlled.  Will add back Lisinopril and Hydralazine to patient's regimen.  Highly recommend patient follow up with HF clinic.  Patient does not need refills of medications, states she has plenty at home.  Return to clinic in 1 month for reevaluation.       Relevant Medications   lisinopril (ZESTRIL) 40 MG tablet   hydrALAZINE (APRESOLINE) 50 MG tablet   Other Visit Diagnoses     Acute cystitis with hematuria       Macrobid sent to the pharmacy for patient. Complete course of antibiotics. Will send urine for culture.   Relevant Orders   Urine Culture   Dysuria       Will obtain UA in office today. If UTI is present, will treat with Macrobid. Will make further recommendations based on urine results.    Relevant Orders   Urinalysis, Routine w reflex microscopic (Completed)        Follow up plan: Return in about 1 month (around 12/13/2020) for BP Check.

## 2020-11-12 ENCOUNTER — Telehealth: Payer: Self-pay

## 2020-11-12 ENCOUNTER — Encounter: Payer: Self-pay | Admitting: Nurse Practitioner

## 2020-11-12 ENCOUNTER — Ambulatory Visit (INDEPENDENT_AMBULATORY_CARE_PROVIDER_SITE_OTHER): Payer: Medicaid Other | Admitting: Nurse Practitioner

## 2020-11-12 ENCOUNTER — Other Ambulatory Visit: Payer: Self-pay

## 2020-11-12 VITALS — BP 147/104 | HR 73 | Ht 70.0 in | Wt 222.0 lb

## 2020-11-12 DIAGNOSIS — N3001 Acute cystitis with hematuria: Secondary | ICD-10-CM | POA: Diagnosis not present

## 2020-11-12 DIAGNOSIS — I10 Essential (primary) hypertension: Secondary | ICD-10-CM | POA: Diagnosis not present

## 2020-11-12 DIAGNOSIS — R300 Dysuria: Secondary | ICD-10-CM | POA: Diagnosis not present

## 2020-11-12 LAB — MICROSCOPIC EXAMINATION

## 2020-11-12 LAB — URINALYSIS, ROUTINE W REFLEX MICROSCOPIC
Bilirubin, UA: NEGATIVE
Glucose, UA: NEGATIVE
Ketones, UA: NEGATIVE
Nitrite, UA: POSITIVE — AB
Specific Gravity, UA: 1.02 (ref 1.005–1.030)
Urobilinogen, Ur: 1 mg/dL (ref 0.2–1.0)
pH, UA: 6.5 (ref 5.0–7.5)

## 2020-11-12 MED ORDER — HYDRALAZINE HCL 50 MG PO TABS: 50.0000 mg | ORAL_TABLET | Freq: Three times a day (TID) | ORAL | 1 refills | Status: DC

## 2020-11-12 MED ORDER — LISINOPRIL 40 MG PO TABS: 40.0000 mg | ORAL_TABLET | Freq: Every day | ORAL | 3 refills | Status: DC

## 2020-11-12 MED ORDER — NITROFURANTOIN MONOHYD MACRO 100 MG PO CAPS: 100.0000 mg | ORAL_CAPSULE | Freq: Two times a day (BID) | ORAL | 0 refills | Status: DC

## 2020-11-12 NOTE — Progress Notes (Signed)
Please let patient know that her urine shows that she had a UTI.  I went ahead and sent her macrobid to the pharmacy to treat it.  I will let her know if it needs to be changed based on the culture results.

## 2020-11-12 NOTE — Telephone Encounter (Signed)
-----   Message from Jon Billings, NP sent at 11/12/2020 10:59 AM EDT ----- Please let patient know that her urine shows that she had a UTI.  I went ahead and sent her macrobid to the pharmacy to treat it.  I will let her know if it needs to be changed based on the culture results.

## 2020-11-12 NOTE — Assessment & Plan Note (Signed)
Chronic. Not well controlled.  Will add back Lisinopril and Hydralazine to patient's regimen.  Highly recommend patient follow up with HF clinic.  Patient does not need refills of medications, states she has plenty at home.  Return to clinic in 1 month for reevaluation.

## 2020-11-12 NOTE — Addendum Note (Signed)
Addended by: Jon Billings on: 11/12/2020 10:58 AM   Modules accepted: Orders

## 2020-11-12 NOTE — Telephone Encounter (Signed)
Patient aware of results and recommendations. °

## 2020-11-12 NOTE — Addendum Note (Signed)
Addended by: Jon Billings on: 11/12/2020 10:39 AM   Modules accepted: Level of Service

## 2020-11-19 LAB — URINE CULTURE

## 2020-11-19 NOTE — Progress Notes (Signed)
Please let patient know that she is on the right antibiotic to treat her UTI.  She should complete the course. Follow up if symptoms worse.

## 2020-12-11 NOTE — Progress Notes (Deleted)
There were no vitals taken for this visit.   Subjective:    Patient ID: [name], female    DOB: [birth year], 55 y.o.   MRN: [id number]  HPI: [name] is a 55 y.o. female  No chief complaint on file.  HYPERTENSION Patient presents to clinic to follow up on blood pressure.  During her hospitalization at the beginning of September all of her blood pressure medications were stopped. During her last visit blood pressure was elevated.  We restarted her clonidine, lasix and amlodipine.  At visit today blood pressure remains elevated.  Patient has not seen HF clinic.  Denies HA, CP, SOB, dizziness, palpitations, visual changes, and lower extremity swelling.  Patient was supposed to have received macrobid following her recent hospitalization.  She had not received it.    Relevant past medical, surgical, family and social history reviewed and updated as indicated. Interim medical history since our last visit reviewed. Allergies and medications reviewed and updated.  Review of Systems  Eyes:  Negative for visual disturbance.  Respiratory:  Negative for cough, chest tightness and shortness of breath.   Cardiovascular:  Negative for chest pain, palpitations and leg swelling.  Neurological:  Negative for dizziness and headaches.   Per HPI unless specifically indicated above     Objective:    There were no vitals taken for this visit.  Wt Readings from Last 3 Encounters:  11/12/20 222 lb (100.7 kg)  10/24/20 214 lb 1.1 oz (97.1 kg)  10/15/20 214 lb (97.1 kg)    Physical Exam Vitals and nursing note reviewed.  Constitutional:      General: She is not in acute distress.    Appearance: Normal appearance. She is normal weight. She is not ill-appearing, toxic-appearing or diaphoretic.  HENT:     Head: Normocephalic.     Right Ear: External ear normal.     Left Ear: External ear normal.     Nose: Nose normal.     Mouth/Throat:     Mouth: Mucous membranes are moist.      Pharynx: Oropharynx is clear.  Eyes:     General:        Right eye: No discharge.        Left eye: No discharge.     Extraocular Movements: Extraocular movements intact.     Conjunctiva/sclera: Conjunctivae normal.     Pupils: Pupils are equal, round, and reactive to light.  Cardiovascular:     Rate and Rhythm: Normal rate and regular rhythm.     Heart sounds: No murmur heard. Pulmonary:     Effort: Pulmonary effort is normal. No respiratory distress.     Breath sounds: Normal breath sounds. No wheezing or rales.  Musculoskeletal:     Cervical back: Normal range of motion and neck supple.  Skin:    General: Skin is warm and dry.     Capillary Refill: Capillary refill takes less than 2 seconds.  Neurological:     General: No focal deficit present.     Mental Status: She is alert and oriented to person, place, and time. Mental status is at baseline.  Psychiatric:        Mood and Affect: Mood normal.        Behavior: Behavior normal.        Thought Content: Thought content normal.        Judgment: Judgment normal.    Results for orders placed or performed in visit on 11/12/20  Microscopic Examination  Urine  Result Value Ref Range   WBC, UA 0-5 0 - 5 /hpf   RBC 0-2 0 - 2 /hpf   Epithelial Cells (non renal) 0-10 0 - 10 /hpf   Bacteria, UA Many (A) None seen/Few  Urine Culture   Specimen: Urine   UR  Result Value Ref Range   Urine Culture, Routine Final report (A)    Organism ID, Bacteria Citrobacter freundii (A)    Antimicrobial Susceptibility Comment   Urinalysis, Routine w reflex microscopic  Result Value Ref Range   Specific Gravity, UA 1.020 1.005 - 1.030   pH, UA 6.5 5.0 - 7.5   Color, UA Yellow Yellow   Appearance Ur Cloudy (A) Clear   Leukocytes,UA 1+ (A) Negative   Protein,UA Trace (A) Negative/Trace   Glucose, UA Negative Negative   Ketones, UA Negative Negative   RBC, UA Trace (A) Negative   Bilirubin, UA Negative Negative   Urobilinogen, Ur 1.0 0.2 -  1.0 mg/dL   Nitrite, UA Positive (A) Negative   Microscopic Examination See below:       Assessment & Plan:   Problem List Items Addressed This Visit   None    Follow up plan: No follow-ups on file.

## 2020-12-15 ENCOUNTER — Ambulatory Visit: Payer: Medicaid Other | Admitting: Nurse Practitioner

## 2020-12-15 DIAGNOSIS — I10 Essential (primary) hypertension: Secondary | ICD-10-CM

## 2021-03-02 ENCOUNTER — Ambulatory Visit: Payer: Self-pay
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
## 2021-03-02 NOTE — Progress Notes (Signed)
BP (!) 165/117 (BP Location: Left Arm, Cuff Size: Normal)    Pulse 65    Temp 98.9 F (37.2 C) (Oral)    Wt 225 lb 9.6 oz (102.3 kg)    SpO2 98%    BMI 32.37 kg/m    Subjective:    Patient ID: [name], female    DOB: 11-Jan-1966, 56 y.o.   MRN: [id number]  HPI: [name] is a 56 y.o. female  Chief Complaint  Patient presents with   Anxiety    Pt states she has been feeling very anxious for the last 2 weeks. States she has been feeling very nervous.    ANXIETY Patient states she has been feeling very anxious.  Started about 2 weeks ago.  She states she had an incident with her neighbor that has caused a lot of stress.  States she has been on edge and not able to sleep.  Denies SI.    [facility] Visit from 03/03/2021 in [facility]  PHQ-9 Total Score 13      GAD 7 : Generalized Anxiety Score 03/03/2021  Nervous, Anxious, on Edge 3  Control/stop worrying 3  Worry too much - different things 3  Trouble relaxing 3  Restless 2  Easily annoyed or irritable 2  Afraid - awful might happen 3  Total GAD 7 Score 19  Anxiety Difficulty Very difficult    Patient states she has had some abdominal pain recently. States she isn't sure if it is related to the stress she has been feeling or if it is related to a kidney stone like she previously had.    Relevant past medical, surgical, family and social history reviewed and updated as indicated. Interim medical history since our last visit reviewed. Allergies and medications reviewed and updated.  Review of Systems  Psychiatric/Behavioral:  Positive for dysphoric mood. The patient is nervous/anxious.    Per HPI unless specifically indicated above     Objective:    BP (!) 165/117 (BP Location: Left Arm, Cuff Size: Normal)    Pulse 65    Temp 98.9 F (37.2 C) (Oral)    Wt 225 lb 9.6 oz (102.3 kg)    SpO2 98%    BMI 32.37 kg/m   Wt Readings from Last 3 Encounters:  03/03/21 225 lb 9.6 oz (102.3 kg)   11/12/20 222 lb (100.7 kg)  10/24/20 214 lb 1.1 oz (97.1 kg)    Physical Exam Vitals and nursing note reviewed.  Constitutional:      General: She is not in acute distress.    Appearance: Normal appearance. She is normal weight. She is not ill-appearing, toxic-appearing or diaphoretic.  HENT:     Head: Normocephalic.     Right Ear: External ear normal.     Left Ear: External ear normal.     Nose: Nose normal.     Mouth/Throat:     Mouth: Mucous membranes are moist.     Pharynx: Oropharynx is clear.  Eyes:     General:        Right eye: No discharge.        Left eye: No discharge.     Extraocular Movements: Extraocular movements intact.     Conjunctiva/sclera: Conjunctivae normal.     Pupils: Pupils are equal, round, and reactive to light.  Cardiovascular:     Rate and Rhythm: Normal rate and regular rhythm.     Heart sounds: No murmur heard. Pulmonary:  Effort: Pulmonary effort is normal. No respiratory distress.     Breath sounds: Normal breath sounds. No wheezing or rales.  Musculoskeletal:     Cervical back: Normal range of motion and neck supple.  Skin:    General: Skin is warm and dry.     Capillary Refill: Capillary refill takes less than 2 seconds.  Neurological:     General: No focal deficit present.     Mental Status: She is alert and oriented to person, place, and time. Mental status is at baseline.  Psychiatric:        Mood and Affect: Mood normal.        Behavior: Behavior normal.        Thought Content: Thought content normal.        Judgment: Judgment normal.    Results for orders placed or performed in visit on 11/12/20  Microscopic Examination   Urine  Result Value Ref Range   WBC, UA 0-5 0 - 5 /hpf   RBC 0-2 0 - 2 /hpf   Epithelial Cells (non renal) 0-10 0 - 10 /hpf   Bacteria, UA Many (A) None seen/Few  Urine Culture   Specimen: Urine   UR  Result Value Ref Range   Urine Culture, Routine Final report (A)    Organism ID, Bacteria  Citrobacter freundii (A)    Antimicrobial Susceptibility Comment   Urinalysis, Routine w reflex microscopic  Result Value Ref Range   Specific Gravity, UA 1.020 1.005 - 1.030   pH, UA 6.5 5.0 - 7.5   Color, UA Yellow Yellow   Appearance Ur Cloudy (A) Clear   Leukocytes,UA 1+ (A) Negative   Protein,UA Trace (A) Negative/Trace   Glucose, UA Negative Negative   Ketones, UA Negative Negative   RBC, UA Trace (A) Negative   Bilirubin, UA Negative Negative   Urobilinogen, Ur 1.0 0.2 - 1.0 mg/dL   Nitrite, UA Positive (A) Negative   Microscopic Examination See below:       Assessment & Plan:   Problem List Items Addressed This Visit       Other   Anxiety - Primary    New problem.  Has worsened over the last two weeks due to issues with her neighbor.  States she is okay with starting medication.  Will start Celexa 10mg  daily. Side effects and benefits of medication discussed with patient during visit. Follow up in 2 weeks for reevaluation.      Relevant Medications   citalopram (CELEXA) 10 MG tablet   Other Visit Diagnoses     Lower abdominal pain       UA obtained during visit. Discussed anxiety vs UTI vs kidney stone. Will make recommendaitons based on labs and follow up after starting celexa.   Relevant Orders   Urinalysis, Routine w reflex microscopic        Follow up plan: Return in about 2 weeks (around 03/17/2021) for Depression/Anxiety FU.

## 2021-03-03 ENCOUNTER — Encounter (INDEPENDENT_AMBULATORY_CARE_PROVIDER_SITE_OTHER): Payer: Self-pay

## 2021-03-03 ENCOUNTER — Ambulatory Visit (INDEPENDENT_AMBULATORY_CARE_PROVIDER_SITE_OTHER): Payer: Medicaid Other | Admitting: Nurse Practitioner

## 2021-03-03 ENCOUNTER — Other Ambulatory Visit: Payer: Self-pay

## 2021-03-03 ENCOUNTER — Encounter: Payer: Self-pay | Admitting: Nurse Practitioner

## 2021-03-03 VITALS — BP 165/117 | HR 65 | Temp 98.9°F | Wt 225.6 lb

## 2021-03-03 DIAGNOSIS — F419 Anxiety disorder, unspecified: Secondary | ICD-10-CM

## 2021-03-03 DIAGNOSIS — R1030 Lower abdominal pain, unspecified: Secondary | ICD-10-CM | POA: Diagnosis not present

## 2021-03-03 LAB — URINALYSIS, ROUTINE W REFLEX MICROSCOPIC
Bilirubin, UA: NEGATIVE
Glucose, UA: NEGATIVE
Ketones, UA: NEGATIVE
Leukocytes,UA: NEGATIVE
Nitrite, UA: NEGATIVE
Specific Gravity, UA: 1.025 (ref 1.005–1.030)
Urobilinogen, Ur: 1 mg/dL (ref 0.2–1.0)
pH, UA: 6.5 (ref 5.0–7.5)

## 2021-03-03 LAB — MICROSCOPIC EXAMINATION
Bacteria, UA: NONE SEEN
WBC, UA: NONE SEEN /hpf (ref 0–5)

## 2021-03-03 MED ORDER — CITALOPRAM HYDROBROMIDE 10 MG PO TABS: 10.0000 mg | ORAL_TABLET | Freq: Every day | ORAL | 0 refills | Status: DC

## 2021-03-03 NOTE — Assessment & Plan Note (Signed)
New problem.  Has worsened over the last two weeks due to issues with her neighbor.  States she is okay with starting medication.  Will start Celexa 10mg  daily. Side effects and benefits of medication discussed with patient during visit. Follow up in 2 weeks for reevaluation.

## 2021-03-03 NOTE — Progress Notes (Signed)
Please let patient know that her urine did not show any evidence of bacteria.  We will continue to monitor it and make sure her symptoms resolve.

## 2021-03-17 ENCOUNTER — Ambulatory Visit: Payer: Medicaid Other | Admitting: Nurse Practitioner

## 2021-03-26 ENCOUNTER — Ambulatory Visit: Payer: Medicaid Other | Admitting: Nurse Practitioner

## 2021-03-26 NOTE — Progress Notes (Deleted)
? ?There were no vitals taken for this visit.  ? ?Subjective:  ? ? Patient ID: [name], female    DOB: [birth year], 56 y.o.   MRN: [id number] ? ?HPI: ?[name] is a 56 y.o. female ? ?No chief complaint on file. ? ?ANXIETY ?Patient states she has been feeling very anxious.  Started about 2 weeks ago.  She states she had an incident with her neighbor that has caused a lot of stress.  States she has been on edge and not able to sleep.  Denies SI.  ? ? ?[facility] Visit from 03/03/2021 in [facility]  ?PHQ-9 Total Score 13  ? ?  ? ?GAD 7 : Generalized Anxiety Score 03/03/2021  ?Nervous, Anxious, on Edge 3  ?Control/stop worrying 3  ?Worry too much - different things 3  ?Trouble relaxing 3  ?Restless 2  ?Easily annoyed or irritable 2  ?Afraid - awful might happen 3  ?Total GAD 7 Score 19  ?Anxiety Difficulty Very difficult  ? ? ? ? ?Relevant past medical, surgical, family and social history reviewed and updated as indicated. Interim medical history since our last visit reviewed. ?Allergies and medications reviewed and updated. ? ?Review of Systems  ?Psychiatric/Behavioral:  Positive for dysphoric mood. The patient is nervous/anxious.   ? ?Per HPI unless specifically indicated above ? ?   ?Objective:  ?  ?There were no vitals taken for this visit.  ?Wt Readings from Last 3 Encounters:  ?03/03/21 225 lb 9.6 oz (102.3 kg)  ?11/12/20 222 lb (100.7 kg)  ?10/24/20 214 lb 1.1 oz (97.1 kg)  ?  ?Physical Exam ?Vitals and nursing note reviewed.  ?Constitutional:   ?   General: She is not in acute distress. ?   Appearance: Normal appearance. She is normal weight. She is not ill-appearing, toxic-appearing or diaphoretic.  ?HENT:  ?   Head: Normocephalic.  ?   Right Ear: External ear normal.  ?   Left Ear: External ear normal.  ?   Nose: Nose normal.  ?   Mouth/Throat:  ?   Mouth: Mucous membranes are moist.  ?   Pharynx: Oropharynx is clear.  ?Eyes:  ?   General:     ?   Right eye: No discharge.     ?    Left eye: No discharge.  ?   Extraocular Movements: Extraocular movements intact.  ?   Conjunctiva/sclera: Conjunctivae normal.  ?   Pupils: Pupils are equal, round, and reactive to light.  ?Cardiovascular:  ?   Rate and Rhythm: Normal rate and regular rhythm.  ?   Heart sounds: No murmur heard. ?Pulmonary:  ?   Effort: Pulmonary effort is normal. No respiratory distress.  ?   Breath sounds: Normal breath sounds. No wheezing or rales.  ?Musculoskeletal:  ?   Cervical back: Normal range of motion and neck supple.  ?Skin: ?   General: Skin is warm and dry.  ?   Capillary Refill: Capillary refill takes less than 2 seconds.  ?Neurological:  ?   General: No focal deficit present.  ?   Mental Status: She is alert and oriented to person, place, and time. Mental status is at baseline.  ?Psychiatric:     ?   Mood and Affect: Mood normal.     ?   Behavior: Behavior normal.     ?   Thought Content: Thought content normal.     ?   Judgment: Judgment normal.  ? ? ?  Results for orders placed or performed in visit on 03/03/21  ?Microscopic Examination  ? Urine  ?Result Value Ref Range  ? WBC, UA None seen 0 - 5 /hpf  ? RBC 0-2 0 - 2 /hpf  ? Epithelial Cells (non renal) 0-10 0 - 10 /hpf  ? Mucus, UA Present (A) Not Estab.  ? Bacteria, UA None seen None seen/Few  ?Urinalysis, Routine w reflex microscopic  ?Result Value Ref Range  ? Specific Gravity, UA 1.025 1.005 - 1.030  ? pH, UA 6.5 5.0 - 7.5  ? Color, UA Yellow Yellow  ? Appearance Ur Clear Clear  ? Leukocytes,UA Negative Negative  ? Protein,UA 1+ (A) Negative/Trace  ? Glucose, UA Negative Negative  ? Ketones, UA Negative Negative  ? RBC, UA Trace (A) Negative  ? Bilirubin, UA Negative Negative  ? Urobilinogen, Ur 1.0 0.2 - 1.0 mg/dL  ? Nitrite, UA Negative Negative  ? Microscopic Examination See below:   ? ?   ?Assessment & Plan:  ? ?Problem List Items Addressed This Visit   ? ?  ? Other  ? Anxiety - Primary  ?  ? ?Follow up plan: ?No follow-ups on file. ? ? ? ? ? ?

## 2021-03-30 ENCOUNTER — Other Ambulatory Visit: Payer: Self-pay | Admitting: Nurse Practitioner

## 2021-03-31 NOTE — Telephone Encounter (Signed)
Pt overdue for an appt. Lmom asking pt to schedule an appt. ?

## 2021-03-31 NOTE — Telephone Encounter (Signed)
Requested medication (s) are due for refill today: Yes ? ?Requested medication (s) are on the active medication list: Yes ? ?Last refill:  03/03/21 ? ?Future visit scheduled: No ? ?Notes to clinic:  This was a new medication. ? ? ? ?Requested Prescriptions  ?Pending Prescriptions Disp Refills  ? citalopram (CELEXA) 10 MG tablet [Pharmacy Med Name: CITALOPRAM HBR 10 MG TABLET] 30 tablet 0  ?  Sig: TAKE 1 TABLET BY MOUTH EVERY DAY  ?  ? Psychiatry:  Antidepressants - SSRI Passed - 03/30/2021  1:42 AM  ?  ?  Passed - Valid encounter within last 6 months  ?  Recent Outpatient Visits   ? ?      ? 4 weeks ago Anxiety  ? [facility], NP  ? 4 months ago Essential hypertension  ? [facility], NP  ? 5 months ago Hospital discharge follow-up  ? [place], NP  ? 1 year ago Essential hypertension  ? [facility] [name], NP  ? 1 year ago Hearing loss due to cerumen impaction, left  ? [facility] [employer], Henrine Screws T, NP  ? ?  ?  ? ?  ?  ?  ? ?

## 2021-04-01 NOTE — Telephone Encounter (Signed)
2nd attempt- Lmom asking pt to call back to schedule an appointment ?

## 2021-04-02 NOTE — Telephone Encounter (Signed)
3rd attempt- Lmom asking pt to call back to schedule an appt. °

## 2021-04-03 ENCOUNTER — Encounter: Payer: Self-pay | Admitting: Nurse Practitioner

## 2021-04-15 ENCOUNTER — Ambulatory Visit
Admission: RE | Admit: 2021-04-15 | Discharge: 2021-04-15 | Disposition: A | Discharge status: Home / Self Care | Payer: Medicaid Other | Attending: Internal Medicine | Admitting: Internal Medicine

## 2021-04-15 ENCOUNTER — Ambulatory Visit (INDEPENDENT_AMBULATORY_CARE_PROVIDER_SITE_OTHER): Payer: Medicaid Other | Admitting: Internal Medicine

## 2021-04-15 ENCOUNTER — Encounter: Payer: Self-pay | Admitting: Internal Medicine

## 2021-04-15 ENCOUNTER — Other Ambulatory Visit: Payer: Self-pay

## 2021-04-15 ENCOUNTER — Ambulatory Visit
Admission: RE | Admit: 2021-04-15 | Discharge: 2021-04-15 | Disposition: A | Discharge status: Home / Self Care | Payer: Medicaid Other | Source: Ambulatory Visit | Attending: Internal Medicine | Admitting: Internal Medicine

## 2021-04-15 VITALS — BP 161/103 | HR 92 | Temp 98.7°F | Ht 70.0 in | Wt 230.6 lb

## 2021-04-15 DIAGNOSIS — M25561 Pain in right knee: Secondary | ICD-10-CM

## 2021-04-15 DIAGNOSIS — G8929 Other chronic pain: Secondary | ICD-10-CM | POA: Diagnosis not present

## 2021-04-15 MED ORDER — MELOXICAM 7.5 MG PO TABS: 7.5000 mg | ORAL_TABLET | Freq: Every day | ORAL | 0 refills | Status: AC
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
## 2021-04-15 NOTE — Progress Notes (Signed)
Pl let pt know she has Osteoarthritis in the right knee without acute abnormality. ?She wil lneed to fu with pcp for further mx

## 2021-04-20 ENCOUNTER — Encounter: Payer: Self-pay | Admitting: Nurse Practitioner

## 2021-04-20 ENCOUNTER — Other Ambulatory Visit: Payer: Self-pay

## 2021-04-20 ENCOUNTER — Ambulatory Visit: Payer: Medicaid Other | Admitting: Internal Medicine

## 2021-04-20 ENCOUNTER — Ambulatory Visit (INDEPENDENT_AMBULATORY_CARE_PROVIDER_SITE_OTHER): Payer: Medicaid Other | Admitting: Nurse Practitioner

## 2021-04-20 VITALS — BP 158/107 | HR 80 | Temp 98.2°F

## 2021-04-20 DIAGNOSIS — I890 Lymphedema, not elsewhere classified: Secondary | ICD-10-CM | POA: Diagnosis not present

## 2021-04-20 NOTE — Progress Notes (Signed)
? ?BP (!) 158/107 (BP Location: Left Arm, Cuff Size: Normal)   Pulse 80   Temp 98.2 ?F (36.8 ?C) (Oral)   SpO2 98%   ? ?Subjective:  ? ? Patient ID: [name], female    DOB: 06/19/1965, 56 y.o.   MRN: [id number] ? ?HPI: ?[name] is a 56 y.o. female ? ?Chief Complaint  ?Patient presents with  ? Letter for School/Work  ?  Pt states she is needing a letter for her landlord stating that she has been on disability and is using a wheelchair   ? ? ?Patient states she is using a wheel chair off and on.  She has varicose veins and uses a wheelchair.  She would also like a referral to go back to the vascular surgery office for her lymphedema.   ? ?Relevant past medical, surgical, family and social history reviewed and updated as indicated. Interim medical history since our last visit reviewed. ?Allergies and medications reviewed and updated. ? ?Review of Systems  ?Musculoskeletal:   ?     Leg pain  ? ?Per HPI unless specifically indicated above ? ?   ?Objective:  ?  ?BP (!) 158/107 (BP Location: Left Arm, Cuff Size: Normal)   Pulse 80   Temp 98.2 ?F (36.8 ?C) (Oral)   SpO2 98%   ?Wt Readings from Last 3 Encounters:  ?04/15/21 230 lb 9.6 oz (104.6 kg)  ?03/03/21 225 lb 9.6 oz (102.3 kg)  ?11/12/20 222 lb (100.7 kg)  ?  ?Physical Exam ?Vitals and nursing note reviewed.  ?Constitutional:   ?   General: She is not in acute distress. ?   Appearance: Normal appearance. She is normal weight. She is not ill-appearing, toxic-appearing or diaphoretic.  ?HENT:  ?   Head: Normocephalic.  ?   Right Ear: External ear normal.  ?   Left Ear: External ear normal.  ?   Nose: Nose normal.  ?   Mouth/Throat:  ?   Mouth: Mucous membranes are moist.  ?   Pharynx: Oropharynx is clear.  ?Eyes:  ?   General:     ?   Right eye: No discharge.     ?   Left eye: No discharge.  ?   Extraocular Movements: Extraocular movements intact.  ?   Conjunctiva/sclera: Conjunctivae normal.  ?   Pupils: Pupils are equal, round, and reactive to  light.  ?Cardiovascular:  ?   Rate and Rhythm: Normal rate and regular rhythm.  ?   Heart sounds: No murmur heard. ?Pulmonary:  ?   Effort: Pulmonary effort is normal. No respiratory distress.  ?   Breath sounds: Normal breath sounds. No wheezing or rales.  ?Musculoskeletal:  ?   Cervical back: Normal range of motion and neck supple.  ?Skin: ?   General: Skin is warm and dry.  ?   Capillary Refill: Capillary refill takes less than 2 seconds.  ?Neurological:  ?   General: No focal deficit present.  ?   Mental Status: She is alert and oriented to person, place, and time. Mental status is at baseline.  ?Psychiatric:     ?   Mood and Affect: Mood normal.     ?   Behavior: Behavior normal.     ?   Thought Content: Thought content normal.     ?   Judgment: Judgment normal.  ? ? ?Results for orders placed or performed in visit on 03/03/21  ?Microscopic Examination  ? Urine  ?Result Value  Ref Range  ? WBC, UA None seen 0 - 5 /hpf  ? RBC 0-2 0 - 2 /hpf  ? Epithelial Cells (non renal) 0-10 0 - 10 /hpf  ? Mucus, UA Present (A) Not Estab.  ? Bacteria, UA None seen None seen/Few  ?Urinalysis, Routine w reflex microscopic  ?Result Value Ref Range  ? Specific Gravity, UA 1.025 1.005 - 1.030  ? pH, UA 6.5 5.0 - 7.5  ? Color, UA Yellow Yellow  ? Appearance Ur Clear Clear  ? Leukocytes,UA Negative Negative  ? Protein,UA 1+ (A) Negative/Trace  ? Glucose, UA Negative Negative  ? Ketones, UA Negative Negative  ? RBC, UA Trace (A) Negative  ? Bilirubin, UA Negative Negative  ? Urobilinogen, Ur 1.0 0.2 - 1.0 mg/dL  ? Nitrite, UA Negative Negative  ? Microscopic Examination See below:   ? ?   ?Assessment & Plan:  ? ?Problem List Items Addressed This Visit   ? ?  ? Other  ? Lymphedema - Primary  ?  Chronic. Uses wheelchair to get around. Will write letter for patient's land lord to state that she uses a wheelchair.  Referral placed for patient to see Vascular for further evaluation and treatment of Lymphedema. ?  ?  ? Relevant Orders  ?  Ambulatory referral to Vascular Surgery  ?  ? ?Follow up plan: ?Return if symptoms worsen or fail to improve. ? ? ? ? ? ?

## 2021-04-20 NOTE — Progress Notes (Signed)
Pt did not show for scheduled appointment.  

## 2021-04-20 NOTE — Assessment & Plan Note (Signed)
Chronic. Uses wheelchair to get around. Will write letter for patient's land lord to state that she uses a wheelchair.  Referral placed for patient to see Vascular for further evaluation and treatment of Lymphedema. ?

## 2021-04-23 ENCOUNTER — Ambulatory Visit (INDEPENDENT_AMBULATORY_CARE_PROVIDER_SITE_OTHER): Payer: Medicaid Other | Admitting: Nurse Practitioner

## 2021-04-23 ENCOUNTER — Encounter: Payer: Self-pay | Admitting: Nurse Practitioner

## 2021-04-23 VITALS — BP 139/83 | HR 80 | Temp 98.5°F | Wt 224.4 lb

## 2021-04-23 DIAGNOSIS — M25561 Pain in right knee: Secondary | ICD-10-CM | POA: Diagnosis not present

## 2021-04-23 DIAGNOSIS — G8929 Other chronic pain: Secondary | ICD-10-CM | POA: Diagnosis not present

## 2021-04-23 NOTE — Assessment & Plan Note (Signed)
Chronic. Ongoing.  Improved with NSAIDS.  Reviewed xray which showed arthritis.  Will send patient to physical therapy to help strengthen muscles and make her feel more stable.  Follow up if symptoms worsen or fail to improve. ?

## 2021-04-23 NOTE — Progress Notes (Signed)
? ?BP 139/83   Pulse 80   Temp 98.5 ?F (36.9 ?C) (Oral)   Wt 224 lb 6.4 oz (101.8 kg)   SpO2 93%   BMI 32.20 kg/m?   ? ?Subjective:  ? ? Patient ID: [name], female    DOB: 1965/10/14, 56 y.o.   MRN: [id number] ? ?HPI: ?[name] is a 56 y.o. female ? ?Chief Complaint  ?Patient presents with  ? Knee Pain  ?  Pt states her R knee has been bothering her for the last 3 months. States she feels more pain at night.   ? ?KNEE PAIN ?Duration: chronic ?Involved knee: right ?Mechanism of injury: unknown ?Location:diffuse ?Onset: gradual ?Severity: 7/10  ?Quality:  aching ?Frequency: intermittent ?Radiation: no ?Aggravating factors:  standing up   ?Alleviating factors: rest  ?Status: stable ?Treatments attempted: ibuprofen  ?Relief with NSAIDs?:  moderate ?Weakness with weight bearing or walking: no ?Sensation of giving way: yes ?Locking: no ?Popping: yes ?Bruising: no ?Swelling: no ?Redness: no ?Paresthesias/decreased sensation: no ?Fevers: no ? ?Relevant past medical, surgical, family and social history reviewed and updated as indicated. Interim medical history since our last visit reviewed. ?Allergies and medications reviewed and updated. ? ?Review of Systems  ?Musculoskeletal:   ?     Right knee pain  ? ?Per HPI unless specifically indicated above ? ?   ?Objective:  ?  ?BP 139/83   Pulse 80   Temp 98.5 ?F (36.9 ?C) (Oral)   Wt 224 lb 6.4 oz (101.8 kg)   SpO2 93%   BMI 32.20 kg/m?   ?Wt Readings from Last 3 Encounters:  ?04/23/21 224 lb 6.4 oz (101.8 kg)  ?04/15/21 230 lb 9.6 oz (104.6 kg)  ?03/03/21 225 lb 9.6 oz (102.3 kg)  ?  ?Physical Exam ?Vitals and nursing note reviewed.  ?Constitutional:   ?   General: She is not in acute distress. ?   Appearance: Normal appearance. She is normal weight. She is not ill-appearing, toxic-appearing or diaphoretic.  ?HENT:  ?   Head: Normocephalic.  ?   Right Ear: External ear normal.  ?   Left Ear: External ear normal.  ?   Nose: Nose normal.  ?   Mouth/Throat:   ?   Mouth: Mucous membranes are moist.  ?   Pharynx: Oropharynx is clear.  ?Eyes:  ?   General:     ?   Right eye: No discharge.     ?   Left eye: No discharge.  ?   Extraocular Movements: Extraocular movements intact.  ?   Conjunctiva/sclera: Conjunctivae normal.  ?   Pupils: Pupils are equal, round, and reactive to light.  ?Cardiovascular:  ?   Rate and Rhythm: Normal rate and regular rhythm.  ?   Heart sounds: No murmur heard. ?Pulmonary:  ?   Effort: Pulmonary effort is normal. No respiratory distress.  ?   Breath sounds: Normal breath sounds. No wheezing or rales.  ?Musculoskeletal:  ?   Cervical back: Normal range of motion and neck supple.  ?Skin: ?   General: Skin is warm and dry.  ?   Capillary Refill: Capillary refill takes less than 2 seconds.  ?Neurological:  ?   General: No focal deficit present.  ?   Mental Status: She is alert and oriented to person, place, and time. Mental status is at baseline.  ?Psychiatric:     ?   Mood and Affect: Mood normal.     ?   Behavior: Behavior  normal.     ?   Thought Content: Thought content normal.     ?   Judgment: Judgment normal.  ? ? ?Results for orders placed or performed in visit on 03/03/21  ?Microscopic Examination  ? Urine  ?Result Value Ref Range  ? WBC, UA None seen 0 - 5 /hpf  ? RBC 0-2 0 - 2 /hpf  ? Epithelial Cells (non renal) 0-10 0 - 10 /hpf  ? Mucus, UA Present (A) Not Estab.  ? Bacteria, UA None seen None seen/Few  ?Urinalysis, Routine w reflex microscopic  ?Result Value Ref Range  ? Specific Gravity, UA 1.025 1.005 - 1.030  ? pH, UA 6.5 5.0 - 7.5  ? Color, UA Yellow Yellow  ? Appearance Ur Clear Clear  ? Leukocytes,UA Negative Negative  ? Protein,UA 1+ (A) Negative/Trace  ? Glucose, UA Negative Negative  ? Ketones, UA Negative Negative  ? RBC, UA Trace (A) Negative  ? Bilirubin, UA Negative Negative  ? Urobilinogen, Ur 1.0 0.2 - 1.0 mg/dL  ? Nitrite, UA Negative Negative  ? Microscopic Examination See below:   ? ?   ?Assessment & Plan:  ? ?Problem  List Items Addressed This Visit   ? ?  ? Other  ? Chronic pain of right knee - Primary  ?  Chronic. Ongoing.  Improved with NSAIDS.  Reviewed xray which showed arthritis.  Will send patient to physical therapy to help strengthen muscles and make her feel more stable.  Follow up if symptoms worsen or fail to improve. ?  ?  ? Relevant Orders  ? Ambulatory referral to Physical Therapy  ?  ? ?Follow up plan: ?Return if symptoms worsen or fail to improve. ? ? ? ? ? ?

## 2021-04-30 ENCOUNTER — Ambulatory Visit: Payer: Self-pay

## 2021-04-30 NOTE — Telephone Encounter (Signed)
Belinda, pt's sister called, she is at work currently and states she would call back in a few mins when she was able to hear better.  ? ?Summary: possibly needs to be evaluated by a psychiatrist.  ? Pt sister stated she feels pt needs to be evaluated by a psychiatrist. Pt sister stated she is not in a good place stated pt makes people upset or makes things bigger than they are. Rants lies. Pt sister stated neighbors are upset due to things she is saying are not true. Pt sister stated pt is drinking as well.  ? ?Pt sister is seeking clinical advice and has requested a call back.  ? ?Call back to sister Marliss Coots [phone]   ?  ? ?

## 2021-04-30 NOTE — Telephone Encounter (Signed)
?  Chief Complaint: depression ?Symptoms: depression, sad, overwhelmed, stressed out ?Frequency: ongoing for years just gotten where pt is wanting help ?Pertinent Negatives: Patient denies SI or HI ?Disposition: '[]'$ ED /'[]'$ Urgent Care (no appt availability in office) / '[x]'$ Appointment(In office/virtual)/ '[]'$  [place] [facility]/ '[]'$ Home Care/ '[]'$ Refused Recommended Disposition /'[]'$ [place] [facility]/ '[]'$  Follow-up with PCP ?Additional Notes: pt called, per pt she is having a lot of family issues and drama and hasn't along with her mother in years and theres just stuff going on that's eating at pt and she is tired of dealing with her family. Pt states she wants to move out of her sisters house and away from her niece. Pt did admit to drinking but she states it may be only 2-3 times a week. She denies any drug use. Pt would like to come in and get some help. Pt scheduled for 05/04/21 at 1520 with Jolene. Advised pt to call back if she needed someone to talk to until appt. ? ? ?Reason for Disposition ? Requesting to talk with a counselor (mental health worker, psychiatrist, etc.) ? ?Answer Assessment - Initial Assessment Questions ?1. CONCERN: "What happened that made you call today?" ?    Feeling depressed  ?2. DEPRESSION SYMPTOM SCREENING: "How are you feeling overall?" (e.g., decreased energy, increased sleeping or difficulty sleeping, difficulty concentrating, feelings of sadness, guilt, hopelessness, or worthlessness) ?    Sadness  ?3. RISK OF HARM - SUICIDAL IDEATION:  "Do you ever have thoughts of hurting or killing yourself?"  (e.g., yes, no, no but preoccupation with thoughts about death) ?  - INTENT:  "Do you have thoughts of hurting or killing yourself right NOW?" (e.g., yes, no, N/A) ?  - PLAN: "Do you have a specific plan for how you would do this?" (e.g., gun, knife, overdose, no plan, N/A) ?    No ?4. RISK OF HARM - HOMICIDAL IDEATION:  "Do you ever have thoughts of hurting or killing someone else?"   (e.g., yes, no, no but preoccupation with thoughts about death) ?  - INTENT:  "Do you have thoughts of hurting or killing someone right NOW?" (e.g., yes, no, N/A) ?  - PLAN: "Do you have a specific plan for how you would do this?" (e.g., gun, knife, no plan, N/A)  ?    No ?5. FUNCTIONAL IMPAIRMENT: "How have things been going for you overall? Have you had more difficulty than usual doing your normal daily activities?"  (e.g., better, same, worse; self-care, school, work, interactions) ?    No ?6. SUPPORT: "Who is with you now?" "Who do you live with?" "Do you have family or friends who you can talk to?"  ?    Brothers and sisters but tired of dealing with them ?7. THERAPIST: "Do you have a counselor or therapist? Name?" ?    No ?8. STRESSORS: "Has there been any new stress or recent changes in your life?" ?    Living with sister and daughter and tired of dealing with her family  ?9. ALCOHOL USE OR SUBSTANCE USE (DRUG USE): "Do you drink alcohol or use any illegal drugs?" ?    Occasional drinking not every day. No drugs ?10. OTHER: "Do you have any other physical symptoms right now?" (e.g., fever) ?      No just overwhelmed and stressed out. ? ?Protocols used: Depression-A-AH ? ?

## 2021-05-02 NOTE — Patient Instructions (Incomplete)

## 2021-05-04 ENCOUNTER — Ambulatory Visit: Payer: Medicaid Other | Admitting: Nurse Practitioner

## 2021-07-13 ENCOUNTER — Other Ambulatory Visit: Payer: Self-pay

## 2021-07-13 ENCOUNTER — Ambulatory Visit: Payer: Self-pay

## 2021-07-13 MED ORDER — ALLOPURINOL 300 MG PO TABS: 300.0000 mg | ORAL_TABLET | Freq: Every day | ORAL | 0 refills | Status: DC

## 2021-07-13 MED ORDER — COLCHICINE 0.6 MG PO TABS: ORAL_TABLET | ORAL | 0 refills | Status: DC

## 2021-07-13 NOTE — Telephone Encounter (Signed)
  Chief Complaint: gout flare up Symptoms: L foot pain 10/10, swelling redness Frequency: 3 days  Pertinent Negatives: NA Disposition: '[]'$ ED /'[]'$ Urgent Care (no appt availability in office) / '[]'$ Appointment(In office/virtual)/ '[]'$  [facility]/ '[]'$ Home Care/ '[]'$ Refused Recommended Disposition /'[]'$ [facility]/ '[x]'$  Follow-up with PCP Additional Notes: pt didn't want to come in for visit, she wanted her medications refilled for gout flare up. I was able to send in rx for colchicine but unable to refill allopurinol. Pt also requesting to have oxygen concentrator sent back out to home d/t they came and picked up old tank but never brought in new one. Pt thought she was set up with [facility] but when contacted they told her she needed new company since they have relocated. Advised her I will send to practice and someone can fu with her.   Summary: pt has severe gout and does not know name of gout med she takes nor have bottle   PT wants a FU call  [phone] states has gout so bad can not walk at all and does not know the name of med last prescribed just that is purple. Pt states fu as in a lot of pain.      Reason for Disposition  [1] SEVERE pain (e.g., excruciating, unable to do any normal activities) AND [2] not improved after 2 hours of pain medicine  Answer Assessment - Initial Assessment Questions 1. ONSET: "When did the pain start?"      3 days  2. LOCATION: "Where is the pain located?"      L foot and ankle  3. PAIN: "How bad is the pain?"    (Scale 1-10; or mild, moderate, severe)  - MILD (1-3): doesn't interfere with normal activities.   - MODERATE (4-7): interferes with normal activities (e.g., work or school) or awakens from sleep, limping.   - SEVERE (8-10): excruciating pain, unable to do any normal activities, unable to walk.      10/10 5. CAUSE: "What do you think is causing the foot pain?"     gout 6. OTHER SYMPTOMS: "Do you have any other symptoms?" (e.g.,  leg pain, rash, fever, numbness)     Swelling  Protocols used: Foot Pain-A-AH

## 2021-07-13 NOTE — Telephone Encounter (Signed)
Refill was sent in by [facility] for allopurinol via refill request. Please advise on O2 concentrator.

## 2021-07-13 NOTE — Telephone Encounter (Signed)
Requested medication (s) are due for refill today: yes  Requested medication (s) are on the active medication list: yes  Last refill:  10/08/20 #30/0  Future visit scheduled: no  Notes to clinic:  Unable to refill per protocol due to failed labs, no updated results.    Requested Prescriptions  Pending Prescriptions Disp Refills   allopurinol (ZYLOPRIM) 300 MG tablet 30 tablet 0    Sig: Take 1 tablet (300 mg total) by mouth daily.     Endocrinology:  Gout Agents - allopurinol Failed - 07/13/2021 11:32 AM      Failed - Uric Acid in normal range and within 360 days    Uric Acid  Date Value Ref Range Status  07/02/2019 8.2 (H) 3.0 - 7.2 mg/dL Final    Comment:               Therapeutic target for gout patients: <6.0         Passed - Cr in normal range and within 360 days    Creatinine  Date Value Ref Range Status  01/15/2014 1.36 (H) 0.60 - 1.30 mg/dL Final   Creatinine, Ser  Date Value Ref Range Status  10/24/2020 0.80 0.44 - 1.00 mg/dL Final         Passed - Valid encounter within last 12 months    Recent Outpatient Visits           2 months ago Chronic pain of right knee   [facility] Jon Billings, NP   2 months ago Lymphedema   [facility] Jon Billings, NP   2 months ago Chronic pain of right knee   [place] Vigg, Avanti, MD   4 months ago [place], NP   8 months ago Essential hypertension   [place], NP              Passed - CBC within normal limits and completed in the last 12 months    WBC  Date Value Ref Range Status  10/08/2020 5.5 3.4 - 10.8 x10E3/uL Final    Comment:    White count and platelets may be decreased due to fibrin strands in the sample submitted.   10/04/2020 9.5 4.0 - 10.5 K/uL Final   RBC  Date Value Ref Range Status  10/08/2020 4.76 3.77 - 5.28 x10E6/uL Final  10/04/2020 4.44 3.87 - 5.11 MIL/uL Final    Hemoglobin  Date Value Ref Range Status  10/24/2020 14.3 12.0 - 15.0 g/dL Final  10/08/2020 14.3 11.1 - 15.9 g/dL Final   HCT  Date Value Ref Range Status  10/24/2020 42.0 36.0 - 46.0 % Final   Hematocrit  Date Value Ref Range Status  10/08/2020 42.3 34.0 - 46.6 % Final   MCHC  Date Value Ref Range Status  10/08/2020 33.8 31.5 - 35.7 g/dL Final  10/04/2020 32.2 30.0 - 36.0 g/dL Final   [facility]  Date Value Ref Range Status  10/08/2020 30.0 26.6 - 33.0 pg Final  10/04/2020 30.2 26.0 - 34.0 pg Final   MCV  Date Value Ref Range Status  10/08/2020 89 79 - 97 fL Final  01/15/2014 87 80 - 100 fL Final   No results found for: "PLTCOUNTKUC", "LABPLAT", "POCPLA" RDW  Date Value Ref Range Status  10/08/2020 14.1 11.7 - 15.4 % Final  01/15/2014 16.0 (H) 11.5 - 14.5 % Final         Signed Prescriptions Disp Refills  colchicine 0.6 MG tablet 90 tablet 0    Sig: TAKE ONE (1) TABLET BY MOUTH TWICE DAILY AS NEEDED FOR GOUT FLARES     Endocrinology:  Gout Agents - colchicine Passed - 07/13/2021 11:32 AM      Passed - Cr in normal range and within 360 days    Creatinine  Date Value Ref Range Status  01/15/2014 1.36 (H) 0.60 - 1.30 mg/dL Final   Creatinine, Ser  Date Value Ref Range Status  10/24/2020 0.80 0.44 - 1.00 mg/dL Final         Passed - ALT in normal range and within 360 days    ALT  Date Value Ref Range Status  10/08/2020 8 0 - 32 IU/L Final   SGPT (ALT)  Date Value Ref Range Status  01/14/2014 39 U/L Final    Comment:    14-63 NOTE: New Reference Range 08/14/13          Passed - AST in normal range and within 360 days    AST  Date Value Ref Range Status  10/08/2020 18 0 - 40 IU/L Final   SGOT(AST)  Date Value Ref Range Status  01/14/2014 43 (H) 15 - 37 Unit/L Final         Passed - Valid encounter within last 12 months    Recent Outpatient Visits           2 months ago Chronic pain of right knee   [facility] Jon Billings,  NP   2 months ago Lymphedema   [facility] Jon Billings, NP   2 months ago Chronic pain of right knee   [place] Vigg, Avanti, MD   4 months ago [place], NP   8 months ago Essential hypertension   [place], NP              Passed - CBC within normal limits and completed in the last 12 months    WBC  Date Value Ref Range Status  10/08/2020 5.5 3.4 - 10.8 x10E3/uL Final    Comment:    White count and platelets may be decreased due to fibrin strands in the sample submitted.   10/04/2020 9.5 4.0 - 10.5 K/uL Final   RBC  Date Value Ref Range Status  10/08/2020 4.76 3.77 - 5.28 x10E6/uL Final  10/04/2020 4.44 3.87 - 5.11 MIL/uL Final   Hemoglobin  Date Value Ref Range Status  10/24/2020 14.3 12.0 - 15.0 g/dL Final  10/08/2020 14.3 11.1 - 15.9 g/dL Final   HCT  Date Value Ref Range Status  10/24/2020 42.0 36.0 - 46.0 % Final   Hematocrit  Date Value Ref Range Status  10/08/2020 42.3 34.0 - 46.6 % Final   MCHC  Date Value Ref Range Status  10/08/2020 33.8 31.5 - 35.7 g/dL Final  10/04/2020 32.2 30.0 - 36.0 g/dL Final   [facility]  Date Value Ref Range Status  10/08/2020 30.0 26.6 - 33.0 pg Final  10/04/2020 30.2 26.0 - 34.0 pg Final   MCV  Date Value Ref Range Status  10/08/2020 89 79 - 97 fL Final  01/15/2014 87 80 - 100 fL Final   No results found for: "PLTCOUNTKUC", "LABPLAT", "POCPLA" RDW  Date Value Ref Range Status  10/08/2020 14.1 11.7 - 15.4 % Final  01/15/2014 16.0 (H) 11.5 - 14.5 % Final

## 2021-07-13 NOTE — Telephone Encounter (Signed)
Please advise on medication refills for gout. Would patient need an appointment for oxygen concentrator?

## 2021-07-13 NOTE — Telephone Encounter (Signed)
Requested Prescriptions  Pending Prescriptions Disp Refills  . allopurinol (ZYLOPRIM) 300 MG tablet 30 tablet 0    Sig: Take 1 tablet (300 mg total) by mouth daily.     Endocrinology:  Gout Agents - allopurinol Failed - 07/13/2021 11:32 AM      Failed - Uric Acid in normal range and within 360 days    Uric Acid  Date Value Ref Range Status  07/02/2019 8.2 (H) 3.0 - 7.2 mg/dL Final    Comment:               Therapeutic target for gout patients: <6.0         Passed - Cr in normal range and within 360 days    Creatinine  Date Value Ref Range Status  01/15/2014 1.36 (H) 0.60 - 1.30 mg/dL Final   Creatinine, Ser  Date Value Ref Range Status  10/24/2020 0.80 0.44 - 1.00 mg/dL Final         Passed - Valid encounter within last 12 months    Recent Outpatient Visits          2 months ago Chronic pain of right knee   [facility] Jon Billings, NP   2 months ago Lymphedema   [facility] Jon Billings, NP   2 months ago Chronic pain of right knee   [place] Vigg, Avanti, MD   4 months ago [place], NP   8 months ago Essential hypertension   [place], NP             Passed - CBC within normal limits and completed in the last 12 months    WBC  Date Value Ref Range Status  10/08/2020 5.5 3.4 - 10.8 x10E3/uL Final    Comment:    White count and platelets may be decreased due to fibrin strands in the sample submitted.   10/04/2020 9.5 4.0 - 10.5 K/uL Final   RBC  Date Value Ref Range Status  10/08/2020 4.76 3.77 - 5.28 x10E6/uL Final  10/04/2020 4.44 3.87 - 5.11 MIL/uL Final   Hemoglobin  Date Value Ref Range Status  10/24/2020 14.3 12.0 - 15.0 g/dL Final  10/08/2020 14.3 11.1 - 15.9 g/dL Final   HCT  Date Value Ref Range Status  10/24/2020 42.0 36.0 - 46.0 % Final   Hematocrit  Date Value Ref Range Status  10/08/2020 42.3 34.0 - 46.6 %  Final   MCHC  Date Value Ref Range Status  10/08/2020 33.8 31.5 - 35.7 g/dL Final  10/04/2020 32.2 30.0 - 36.0 g/dL Final   [facility] [facility]  Date Value Ref Range Status  10/08/2020 30.0 26.6 - 33.0 pg Final  10/04/2020 30.2 26.0 - 34.0 pg Final   MCV  Date Value Ref Range Status  10/08/2020 89 79 - 97 fL Final  01/15/2014 87 80 - 100 fL Final   No results found for: "PLTCOUNTKUC", "LABPLAT", "POCPLA" RDW  Date Value Ref Range Status  10/08/2020 14.1 11.7 - 15.4 % Final  01/15/2014 16.0 (H) 11.5 - 14.5 % Final         . colchicine 0.6 MG tablet 30 tablet 1    Sig: TAKE ONE (1) TABLET BY MOUTH TWICE DAILY AS NEEDED FOR GOUT FLARES     Endocrinology:  Gout Agents - colchicine Passed - 07/13/2021 11:32 AM      Passed - Cr in normal range and within 360 days  Creatinine  Date Value Ref Range Status  01/15/2014 1.36 (H) 0.60 - 1.30 mg/dL Final   Creatinine, Ser  Date Value Ref Range Status  10/24/2020 0.80 0.44 - 1.00 mg/dL Final         Passed - ALT in normal range and within 360 days    ALT  Date Value Ref Range Status  10/08/2020 8 0 - 32 IU/L Final   SGPT (ALT)  Date Value Ref Range Status  01/14/2014 39 U/L Final    Comment:    14-63 NOTE: New Reference Range 08/14/13          Passed - AST in normal range and within 360 days    AST  Date Value Ref Range Status  10/08/2020 18 0 - 40 IU/L Final   SGOT(AST)  Date Value Ref Range Status  01/14/2014 43 (H) 15 - 37 Unit/L Final         Passed - Valid encounter within last 12 months    Recent Outpatient Visits          2 months ago Chronic pain of right knee   [facility] Jon Billings, NP   2 months ago Lymphedema   [facility] [place], [place], NP   2 months ago Chronic pain of right knee   [place] Vigg, Avanti, MD   4 months ago [place], NP   8 months ago Essential hypertension   [place], NP             Passed - CBC within normal limits and completed in the last 12 months    WBC  Date Value Ref Range Status  10/08/2020 5.5 3.4 - 10.8 x10E3/uL Final    Comment:    White count and platelets may be decreased due to fibrin strands in the sample submitted.   10/04/2020 9.5 4.0 - 10.5 K/uL Final   RBC  Date Value Ref Range Status  10/08/2020 4.76 3.77 - 5.28 x10E6/uL Final  10/04/2020 4.44 3.87 - 5.11 MIL/uL Final   Hemoglobin  Date Value Ref Range Status  10/24/2020 14.3 12.0 - 15.0 g/dL Final  10/08/2020 14.3 11.1 - 15.9 g/dL Final   HCT  Date Value Ref Range Status  10/24/2020 42.0 36.0 - 46.0 % Final   Hematocrit  Date Value Ref Range Status  10/08/2020 42.3 34.0 - 46.6 % Final   MCHC  Date Value Ref Range Status  10/08/2020 33.8 31.5 - 35.7 g/dL Final  10/04/2020 32.2 30.0 - 36.0 g/dL Final   [facility]  Date Value Ref Range Status  10/08/2020 30.0 26.6 - 33.0 pg Final  10/04/2020 30.2 26.0 - 34.0 pg Final   MCV  Date Value Ref Range Status  10/08/2020 89 79 - 97 fL Final  01/15/2014 87 80 - 100 fL Final   No results found for: "PLTCOUNTKUC", "LABPLAT", "POCPLA" RDW  Date Value Ref Range Status  10/08/2020 14.1 11.7 - 15.4 % Final  01/15/2014 16.0 (H) 11.5 - 14.5 % Final

## 2021-07-14 NOTE — Telephone Encounter (Signed)
Attempted to call patient regarding [name]'s advise, pt needs to be seen in office for O2 discussion. Unable to LVM due to inbox being full.   OK for PEC/Nurse triage to relay information and schedule office visit with PCP if pt calls back.

## 2021-07-30 NOTE — Progress Notes (Signed)
Pulse 78   Temp 98.8 F (37.1 C) (Oral)   Wt 221 lb 9.6 oz (100.5 kg)   SpO2 97%   BMI 31.80 kg/m    Subjective:    Patient ID: [name], female    DOB: November 26, 1965, 56 y.o.   MRN: [id number]  HPI: [name] is a 56 y.o. female  Chief Complaint  Patient presents with   oxygen    Pt reports she used to have oxygen- the tank was broken and never got a replacement. Wants to discuss on getting rx for oxygen.    Medication Refill     Per patient she needs refills on several of her meds    OXYGEN Patient states she used to have oxygen but then it broke and she would like to get a new prescription.   HYPERTENSION / HYPERLIPIDEMIA Patient states she does weigh herself daily.  No change in her weight.   Satisfied with current treatment? yes Duration of hypertension: years BP monitoring frequency: not checking BP range:  BP medication side effects: no Past BP meds:  hydralazine clonidine, amlodipine, and lisinopril Duration of hyperlipidemia: years Cholesterol medication side effects: no Cholesterol supplements: none Past cholesterol medications: atorvastain (lipitor),  Medication compliance: excellent compliance Aspirin: yes Recent stressors: no Recurrent headaches: no Visual changes: no Palpitations: no Dyspnea: yes Chest pain: no Lower extremity edema: yes Dizzy/lightheaded: no  COPD COPD status: uncontrolled Satisfied with current treatment?: no Oxygen use: no - would like oxygen. She feels like she would benefit from oxygen to help her increase her ability to walk further Dyspnea frequency: daily Cough frequency: occasionally Rescue inhaler frequency:  a couple times a week Limitation of activity: yes Productive cough:  Last Spirometry:  Pneumovax: Up to Date Influenza: Not up to Date  ANXIETY Patient states she hasn't been taking the Celexa daily.  She does feel like she needs the medication. She did not realize that it was meant to be taken  daily.  [place] Office Visit from 07/31/2021 in [place]  PHQ-9 Total Score 11         07/31/2021   10:57 AM 04/20/2021   10:34 AM 04/15/2021    3:02 PM 03/03/2021    9:11 AM  GAD 7 : Generalized Anxiety Score  Nervous, Anxious, on Edge _0 Control/stop worrying _1 Worry too much - different things _2 Trouble relaxing _3 Restless _4 Easily annoyed or irritable _5 Afraid - awful might happen _6 Total GAD 7 Score _7 Anxiety Difficulty Somewhat difficult Somewhat difficult  Very difficult    Patient states she has been having a lot of pain on her right pinky finger.  She would like to see Orthopedics to see if there is something that can be done to help her pain.  Relevant past medical, surgical, family and social history reviewed and updated as indicated. Interim medical history since our last visit reviewed. Allergies and medications reviewed and updated.  Review of Systems  Eyes:  Negative for visual disturbance.  Respiratory:  Positive for cough and shortness of breath. Negative for chest tightness.   Cardiovascular:  Positive for leg swelling. Negative for chest pain and palpitations.  Neurological:  Negative for dizziness and headaches.  Psychiatric/Behavioral:  Positive for dysphoric mood. Negative for suicidal ideas. The patient is  nervous/anxious.     Per HPI unless specifically indicated above     Objective:    Pulse 78   Temp 98.8 F (37.1 C) (Oral)   Wt 221 lb 9.6 oz (100.5 kg)   SpO2 97%   BMI 31.80 kg/m   Wt Readings from Last 3 Encounters:  07/31/21 221 lb 9.6 oz (100.5 kg)  04/23/21 224 lb 6.4 oz (101.8 kg)  04/15/21 230 lb 9.6 oz (104.6 kg)    Physical Exam Vitals and nursing note reviewed.  Constitutional:      General: She is not in acute distress.    Appearance: Normal appearance. She is normal weight. She is not ill-appearing, toxic-appearing or diaphoretic.  HENT:     Head:  Normocephalic.     Right Ear: External ear normal.     Left Ear: External ear normal.     Nose: Nose normal.     Mouth/Throat:     Mouth: Mucous membranes are moist.     Pharynx: Oropharynx is clear.  Eyes:     General:        Right eye: No discharge.        Left eye: No discharge.     Extraocular Movements: Extraocular movements intact.     Conjunctiva/sclera: Conjunctivae normal.     Pupils: Pupils are equal, round, and reactive to light.  Cardiovascular:     Rate and Rhythm: Normal rate and regular rhythm.     Heart sounds: No murmur heard. Pulmonary:     Effort: Pulmonary effort is normal. No respiratory distress.     Breath sounds: Normal breath sounds. No wheezing or rales.  Musculoskeletal:     Cervical back: Normal range of motion and neck supple.     Right lower leg: Edema present.     Left lower leg: Edema present.  Skin:    General: Skin is warm and dry.     Capillary Refill: Capillary refill takes less than 2 seconds.  Neurological:     General: No focal deficit present.     Mental Status: She is alert and oriented to person, place, and time. Mental status is at baseline.  Psychiatric:        Mood and Affect: Mood normal.        Behavior: Behavior normal.        Thought Content: Thought content normal.        Judgment: Judgment normal.     Results for orders placed or performed in visit on 03/03/21  Microscopic Examination   Urine  Result Value Ref Range   WBC, UA None seen 0 - 5 /hpf   RBC, Urine 0-2 0 - 2 /hpf   Epithelial Cells (non renal) 0-10 0 - 10 /hpf   Mucus, UA Present (A) Not Estab.   Bacteria, UA None seen None seen/Few  Urinalysis, Routine w reflex microscopic  Result Value Ref Range   Specific Gravity, UA 1.025 1.005 - 1.030   pH, UA 6.5 5.0 - 7.5   Color, UA Yellow Yellow   Appearance Ur Clear Clear   Leukocytes,UA Negative Negative   Protein,UA 1+ (A) Negative/Trace   Glucose, UA Negative Negative   Ketones, UA Negative Negative    RBC, UA Trace (A) Negative   Bilirubin, UA Negative Negative   Urobilinogen, Ur 1.0 0.2 - 1.0 mg/dL   Nitrite, UA Negative Negative   Microscopic Examination See below:       Assessment & Plan:   Problem List  Items Addressed This Visit       Cardiovascular and Mediastinum   Essential hypertension (Chronic)    Chronic. Controlled.  Question whether patient is taking her medication as she has not filled most of them since September 2022.  Did request refills on her medications today.  Patent is taking Hydralazine, Lisinopril, Clonidine, Lasix, and Amlodipine.  Highly recommend patient follow up with HF clinic.  Return to clinic in 3 month for reevaluation.       Relevant Medications   amLODipine (NORVASC) 10 MG tablet   atorvastatin (LIPITOR) 40 MG tablet   cloNIDine (CATAPRES) 0.1 MG tablet   furosemide (LASIX) 20 MG tablet   hydrALAZINE (APRESOLINE) 50 MG tablet   lisinopril (ZESTRIL) 40 MG tablet   Other Relevant Orders   Comp Met (CMET)   Chronic diastolic heart failure (HCC) (Chronic)    Chronic. Has not followed up with HF clinic.  Discussed with patient the importance of following up with them.  On lasix 57m daily.  Labs ordered today.   - Reminded to call for an overnight weight gain of >2 pounds or a weekly weight gain of >5 pounds - not adding salt to food and read food labels. Reviewed the importance of keeping daily sodium intake to <20050mdaily. - Avoid Ibuprofen products.       Relevant Medications   amLODipine (NORVASC) 10 MG tablet   atorvastatin (LIPITOR) 40 MG tablet   cloNIDine (CATAPRES) 0.1 MG tablet   furosemide (LASIX) 20 MG tablet   hydrALAZINE (APRESOLINE) 50 MG tablet   lisinopril (ZESTRIL) 40 MG tablet   Other Relevant Orders   Comp Met (CMET)     Respiratory   COPD (chronic obstructive pulmonary disease) (HCC) - Primary    Chronic. Does not seem well controlled per patient.  States she has been out of the Spiriva.  Refilled for patient today.   She would like to have Oxygen.  Referral placed for patient to see Pulmonology.  Follow up in 3 months for reevaluation.      Relevant Medications   tiotropium (SPIRIVA HANDIHALER) 18 MCG inhalation capsule   Other Relevant Orders   Ambulatory referral to Pulmonology     Other   Anxiety    Chronic. Ongoing.  Educated patient on taking Celexa daily to help with symptoms.  She had only been taking it PRN.  Refilled medication at visit today.  Follow up in 3 months for reevaluation.  Call sooner if concerns arise.      Relevant Medications   citalopram (CELEXA) 10 MG tablet   Other Visit Diagnoses     Chronic fatigue       Likely due to comorbidities.  Will check Vitamin D and rule out anemia at visit today.  Will make recommendations based on lab results.    Relevant Orders   CBC w/Diff   Vitamin D (25 hydroxy)   Right hand pain       Referral placed for patient to see Orthopedics at her request.   Relevant Orders   Ambulatory referral to Orthopedics        Follow up plan: Return in about 3 months (around 10/31/2021) for HTN, HLD, DM2 FU.

## 2021-07-31 ENCOUNTER — Ambulatory Visit (INDEPENDENT_AMBULATORY_CARE_PROVIDER_SITE_OTHER): Payer: Medicaid Other | Admitting: Nurse Practitioner

## 2021-07-31 ENCOUNTER — Encounter: Payer: Self-pay | Admitting: Nurse Practitioner

## 2021-07-31 VITALS — HR 78 | Temp 98.8°F | Wt 221.6 lb

## 2021-07-31 DIAGNOSIS — J449 Chronic obstructive pulmonary disease, unspecified: Secondary | ICD-10-CM | POA: Diagnosis not present

## 2021-07-31 DIAGNOSIS — F419 Anxiety disorder, unspecified: Secondary | ICD-10-CM

## 2021-07-31 DIAGNOSIS — I10 Essential (primary) hypertension: Secondary | ICD-10-CM

## 2021-07-31 DIAGNOSIS — R5382 Chronic fatigue, unspecified: Secondary | ICD-10-CM

## 2021-07-31 DIAGNOSIS — I5032 Chronic diastolic (congestive) heart failure: Secondary | ICD-10-CM

## 2021-07-31 DIAGNOSIS — M79641 Pain in right hand: Secondary | ICD-10-CM

## 2021-07-31 MED ORDER — FUROSEMIDE 20 MG PO TABS: 20.0000 mg | ORAL_TABLET | Freq: Every day | ORAL | 1 refills | Status: DC

## 2021-07-31 MED ORDER — SPIRIVA HANDIHALER 18 MCG IN CAPS
18.0000 ug | ORAL_CAPSULE | Freq: Every day | RESPIRATORY_TRACT | 1 refills | Status: DC
Start: 2021-07-31 — End: 2021-11-03

## 2021-07-31 MED ORDER — AMLODIPINE BESYLATE 10 MG PO TABS: 10.0000 mg | ORAL_TABLET | Freq: Every day | ORAL | 1 refills | Status: DC

## 2021-07-31 MED ORDER — CITALOPRAM HYDROBROMIDE 10 MG PO TABS: 10.0000 mg | ORAL_TABLET | Freq: Every day | ORAL | 1 refills | Status: DC

## 2021-07-31 MED ORDER — HYDRALAZINE HCL 50 MG PO TABS
50.0000 mg | ORAL_TABLET | Freq: Three times a day (TID) | ORAL | 1 refills | Status: DC
Start: 2021-07-31 — End: 2021-10-14

## 2021-07-31 MED ORDER — LISINOPRIL 40 MG PO TABS: 40.0000 mg | ORAL_TABLET | Freq: Every day | ORAL | 1 refills | Status: DC

## 2021-07-31 MED ORDER — CLONIDINE HCL 0.1 MG PO TABS: 0.1000 mg | ORAL_TABLET | Freq: Every day | ORAL | 1 refills | Status: DC

## 2021-07-31 MED ORDER — ATORVASTATIN CALCIUM 40 MG PO TABS: 40.0000 mg | ORAL_TABLET | Freq: Every day | ORAL | 1 refills | Status: DC

## 2021-07-31 NOTE — Assessment & Plan Note (Signed)
Chronic. Has not followed up with HF clinic.  Discussed with patient the importance of following up with them.  On lasix '20mg'$  daily.  Labs ordered today.   - Reminded to call for an overnight weight gain of >2 pounds or a weekly weight gain of >5 pounds - not adding salt to food and read food labels. Reviewed the importance of keeping daily sodium intake to '2000mg'$  daily. - Avoid Ibuprofen products.

## 2021-07-31 NOTE — Assessment & Plan Note (Signed)
Chronic. Does not seem well controlled per patient.  States she has been out of the Spiriva.  Refilled for patient today.  She would like to have Oxygen.  Referral placed for patient to see Pulmonology.  Follow up in 3 months for reevaluation.

## 2021-07-31 NOTE — Assessment & Plan Note (Signed)
Chronic. Controlled.  Question whether patient is taking her medication as she has not filled most of them since September 2022.  Did request refills on her medications today.  Patent is taking Hydralazine, Lisinopril, Clonidine, Lasix, and Amlodipine.  Highly recommend patient follow up with HF clinic.  Return to clinic in 3 month for reevaluation.

## 2021-07-31 NOTE — Assessment & Plan Note (Signed)
Chronic. Ongoing.  Educated patient on taking Celexa daily to help with symptoms.  She had only been taking it PRN.  Refilled medication at visit today.  Follow up in 3 months for reevaluation.  Call sooner if concerns arise.

## 2021-08-01 LAB — CBC WITH DIFFERENTIAL/PLATELET
Basophils Absolute: 0.1 10*3/uL (ref 0.0–0.2)
Basos: 1 %
EOS (ABSOLUTE): 0.1 10*3/uL (ref 0.0–0.4)
Eos: 1 %
Hematocrit: 41.8 % (ref 34.0–46.6)
Hemoglobin: 13.6 g/dL (ref 11.1–15.9)
Immature Grans (Abs): 0 10*3/uL (ref 0.0–0.1)
Immature Granulocytes: 0 %
Lymphocytes Absolute: 2.1 10*3/uL (ref 0.7–3.1)
Lymphs: 27 %
MCH: 30.2 pg (ref 26.6–33.0)
MCHC: 32.5 g/dL (ref 31.5–35.7)
MCV: 93 fL (ref 79–97)
Monocytes Absolute: 0.6 10*3/uL (ref 0.1–0.9)
Monocytes: 8 %
Neutrophils Absolute: 5 10*3/uL (ref 1.4–7.0)
Neutrophils: 63 %
Platelets: 246 10*3/uL (ref 150–450)
RBC: 4.51 x10E6/uL (ref 3.77–5.28)
RDW: 15.2 % (ref 11.7–15.4)
WBC: 7.9 10*3/uL (ref 3.4–10.8)

## 2021-08-01 LAB — COMPREHENSIVE METABOLIC PANEL
ALT: 7 IU/L (ref 0–32)
AST: 10 IU/L (ref 0–40)
Albumin/Globulin Ratio: 1.5 (ref 1.2–2.2)
Albumin: 4.2 g/dL (ref 3.8–4.9)
Alkaline Phosphatase: 93 IU/L (ref 44–121)
BUN/Creatinine Ratio: 17 (ref 9–23)
BUN: 12 mg/dL (ref 6–24)
Bilirubin Total: 1.3 mg/dL — ABNORMAL HIGH (ref 0.0–1.2)
CO2: 22 mmol/L (ref 20–29)
Calcium: 9.6 mg/dL (ref 8.7–10.2)
Chloride: 103 mmol/L (ref 96–106)
Creatinine, Ser: 0.69 mg/dL (ref 0.57–1.00)
Globulin, Total: 2.8 g/dL (ref 1.5–4.5)
Glucose: 85 mg/dL (ref 70–99)
Potassium: 3.5 mmol/L (ref 3.5–5.2)
Sodium: 143 mmol/L (ref 134–144)
Total Protein: 7 g/dL (ref 6.0–8.5)
eGFR: 102 mL/min/{1.73_m2} (ref >59)

## 2021-08-01 LAB — VITAMIN D 25 HYDROXY (VIT D DEFICIENCY, FRACTURES): Vit D, 25-Hydroxy: 17.4 ng/mL — ABNORMAL LOW (ref 30.0–100.0)

## 2021-08-03 MED ORDER — VITAMIN D3 25 MCG (1000 UT) PO CAPS: 1000.0000 [IU] | ORAL_CAPSULE | Freq: Every day | ORAL | 1 refills | Status: DC

## 2021-08-03 NOTE — Addendum Note (Signed)
Addended by: Jon Billings on: 08/03/2021 08:04 AM   Modules accepted: Orders

## 2021-08-03 NOTE — Progress Notes (Signed)
Please let patient know that overall her lab work looks good.  Her liver, kidneys and electrolytes look good.  Her Vitamin D is low.  I have sent her in a prescription for Vitamin D 1000 IU once daily.  We will recheck this at her next visit.  No other concerns at this time.  Follow up as discussed.

## 2021-08-12 ENCOUNTER — Telehealth: Payer: Self-pay | Admitting: Nurse Practitioner

## 2021-08-12 ENCOUNTER — Emergency Department: Payer: Medicaid Other

## 2021-08-12 ENCOUNTER — Other Ambulatory Visit: Payer: Self-pay

## 2021-08-12 ENCOUNTER — Emergency Department
Admission: EM | Admit: 2021-08-12 | Discharge: 2021-08-12 | Disposition: A | Discharge status: Home / Self Care | Payer: Medicaid Other | Attending: Emergency Medicine | Admitting: Emergency Medicine

## 2021-08-12 DIAGNOSIS — R0602 Shortness of breath: Secondary | ICD-10-CM | POA: Diagnosis present

## 2021-08-12 DIAGNOSIS — I110 Hypertensive heart disease with heart failure: Secondary | ICD-10-CM | POA: Diagnosis not present

## 2021-08-12 DIAGNOSIS — J449 Chronic obstructive pulmonary disease, unspecified: Secondary | ICD-10-CM | POA: Insufficient documentation

## 2021-08-12 DIAGNOSIS — E8770 Fluid overload, unspecified: Secondary | ICD-10-CM | POA: Insufficient documentation

## 2021-08-12 DIAGNOSIS — I5033 Acute on chronic diastolic (congestive) heart failure: Secondary | ICD-10-CM | POA: Insufficient documentation

## 2021-08-12 LAB — CBC
HCT: 44.1 % (ref 36.0–46.0)
Hemoglobin: 13.9 g/dL (ref 12.0–15.0)
MCH: 30 pg (ref 26.0–34.0)
MCHC: 31.5 g/dL (ref 30.0–36.0)
MCV: 95.2 fL (ref 80.0–100.0)
Platelets: 244 10*3/uL (ref 150–400)
RBC: 4.63 MIL/uL (ref 3.87–5.11)
RDW: 16.1 % — ABNORMAL HIGH (ref 11.5–15.5)
WBC: 7.2 10*3/uL (ref 4.0–10.5)
nRBC: 0 % (ref 0.0–0.2)

## 2021-08-12 LAB — BASIC METABOLIC PANEL
Anion gap: 11 (ref 5–15)
BUN: 13 mg/dL (ref 6–20)
CO2: 24 mmol/L (ref 22–32)
Calcium: 8.9 mg/dL (ref 8.9–10.3)
Chloride: 106 mmol/L (ref 98–111)
Creatinine, Ser: 0.78 mg/dL (ref 0.44–1.00)
GFR, Estimated: >60 mL/min (ref >60)
Glucose, Bld: 119 mg/dL — ABNORMAL HIGH (ref 70–99)
Potassium: 3 mmol/L — ABNORMAL LOW (ref 3.5–5.1)
Sodium: 141 mmol/L (ref 135–145)

## 2021-08-12 LAB — BRAIN NATRIURETIC PEPTIDE: B Natriuretic Peptide: 55.8 pg/mL (ref 0.0–100.0)

## 2021-08-12 LAB — POC URINE PREG, ED: Preg Test, Ur: NEGATIVE

## 2021-08-12 LAB — D-DIMER, QUANTITATIVE: D-Dimer, Quant: 2.14 ug/mL-FEU — ABNORMAL HIGH (ref 0.00–0.50)

## 2021-08-12 LAB — TROPONIN I (HIGH SENSITIVITY)
Troponin I (High Sensitivity): 9 ng/L (ref <18)
Troponin I (High Sensitivity): 11 ng/L (ref <18)

## 2021-08-12 MED ORDER — FUROSEMIDE 20 MG PO TABS: 20.0000 mg | ORAL_TABLET | Freq: Every day | ORAL | 0 refills | Status: DC

## 2021-08-12 MED ORDER — POTASSIUM CHLORIDE CRYS ER 20 MEQ PO TBCR
40.0000 meq | EXTENDED_RELEASE_TABLET | Freq: Once | ORAL | Status: AC
  Administered 2021-08-12: 40 meq via ORAL
  Filled 2021-08-12: qty 2

## 2021-08-12 MED ORDER — FUROSEMIDE 10 MG/ML IJ SOLN
40.0000 mg | Freq: Once | INTRAMUSCULAR | Status: AC
  Administered 2021-08-12: 40 mg via INTRAVENOUS
  Filled 2021-08-12: qty 4

## 2021-08-12 MED ORDER — IOHEXOL 350 MG/ML SOLN
75.0000 mL | Freq: Once | INTRAVENOUS | Status: AC | PRN
  Administered 2021-08-12: 75 mL via INTRAVENOUS

## 2021-08-12 NOTE — ED Notes (Signed)
Pt walked with this tech one lap around [employer] w/o O2. Pt O2 sats stayed at 97% or better.

## 2021-08-12 NOTE — TOC Progression Note (Signed)
Transition of Care [facility]) - Progression Note    Patient Details  Name: [name] MRN: [id number] Date of Birth: 01/22/1966  [facility]) CM/SW Contact  Shelbie Hutching, RN Phone Number: 08/12/2021, 1:19 PM  Clinical Narrative:    Received a call from ED MD to see if [facility] can assist patient with her broken oxygen at home.  Patient reports that she should be on chronic oxygen 2L but that her machine has been broken for 2 months.   Reviewing chart patient was seen at her PCP office at the beginning of this month and a referral was placed to pulmonology to get her set up with oxygen.  RNCM has reached out to [place] with [employer] to see if they currently have her as a patient.  Will wait to hear back from him.  Patient believes that her oxygen company was [employer] which is now [employer].         Expected Discharge Plan and Services                                                 Social Determinants of Health (SDOH) Interventions    Readmission Risk Interventions     No data to display

## 2021-08-12 NOTE — ED Triage Notes (Signed)
AAOx3.  Skin warm and dry.  Arrives via [facility], c/o chest pain x 1 hour.  324 ASA given PTA.  NAD  VS wnl.  Wears 2l/ [place]

## 2021-08-12 NOTE — ED Provider Notes (Signed)
[facility] Provider Note    Event Date/Time   First MD Initiated Contact with Patient 08/12/21 1235     (approximate)   History   Chief Complaint Chest Pain and Shortness of Breath   HPI  [name] is a 56 y.o. female with past medical history of hypertension, hyperlipidemia, COPD, CHF, chronic hypoxic respiratory failure on 2 L nasal cannula, atrial fibrillation, GERD, PE, and thoracic aortic aneurysm who presents to the ED complaining of shortness of breath.  Patient reports that she has been feeling increasingly short of breath for at least the past month, states the symptoms are constant and exacerbated with any exertion.  She denies any fevers or cough, does state that her legs have felt more swollen than usual.  She reports being compliant with her medication regimen, including Lasix, but states that her oxygen machine has not been working for about the past 2 weeks.  She endorses pain in the center of her chest that has been intermittent for the past couple of weeks, worse this morning.  She describes the feeling as a "dryness" in her chest and denies any chest pain currently.  She initially presented to her PCPs office, who called EMS for her to be evaluated in the ED.     Physical Exam   Triage Vital Signs: ED Triage Vitals  Enc Vitals Group     BP 08/12/21 1123 138/89     Pulse Rate 08/12/21 1123 83     Resp 08/12/21 1123 18     Temp 08/12/21 1123 97.8 F (36.6 C)     Temp Source 08/12/21 1123 Oral     SpO2 08/12/21 1123 98 %     Weight 08/12/21 1119 221 lb (100.2 kg)     Height 08/12/21 1119 '5\' 10"'$  (1.778 m)     Head Circumference --      Peak Flow --      Pain Score 08/12/21 1119 8     Pain Loc --      Pain Edu? --      Excl. in [place]? --     Most recent vital signs: Vitals:   08/12/21 1419 08/12/21 1550  BP: (!) 148/107   Pulse: 73   Resp: 15   Temp: 98.2 F (36.8 C)   SpO2: 100% 98%    Constitutional: Alert and  oriented. Eyes: Conjunctivae are normal. Head: Atraumatic. Nose: No congestion/rhinnorhea. Mouth/Throat: Mucous membranes are moist.  Cardiovascular: Normal rate, regular rhythm. Grossly normal heart sounds.  2+ radial pulses bilaterally. Respiratory: Normal respiratory effort.  No retractions. Lungs with crackles to bilateral bases. Gastrointestinal: Soft and nontender. No distention. Musculoskeletal: No lower extremity tenderness, 1+ pitting edema to knees bilaterally. Neurologic:  Normal speech and language. No gross focal neurologic deficits are appreciated.    ED Results / Procedures / Treatments   Labs (all labs ordered are listed, but only abnormal results are displayed) Labs Reviewed  BASIC METABOLIC PANEL - Abnormal; Notable for the following components:      Result Value   Potassium 3.0 (*)    Glucose, Bld 119 (*)    All other components within normal limits  CBC - Abnormal; Notable for the following components:   RDW 16.1 (*)    All other components within normal limits  D-DIMER, QUANTITATIVE - Abnormal; Notable for the following components:   D-Dimer, Quant 2.14 (*)    All other components within normal limits  BRAIN NATRIURETIC PEPTIDE  POC  URINE PREG, ED  TROPONIN I (HIGH SENSITIVITY)  TROPONIN I (HIGH SENSITIVITY)     EKG  ED ECG REPORT I, Blake Divine, the attending physician, personally viewed and interpreted this ECG.   Date: 08/12/2021  EKG Time: 11:18  Rate: 84  Rhythm: normal sinus rhythm  Axis: LAD  Intervals:none  ST&T Change: None  RADIOLOGY Chest x-ray reviewed and interpreted by me with cardiomegaly and mild pulmonary edema, no focal infiltrate or effusion noted.  PROCEDURES:  Critical Care performed: No  Procedures   MEDICATIONS ORDERED IN ED: Medications  furosemide (LASIX) injection 40 mg (40 mg Intravenous Given 08/12/21 1414)  potassium chloride SA (KLOR-CON M) CR tablet 40 mEq (40 mEq Oral Given 08/12/21 1409)  iohexol  (OMNIPAQUE) 350 MG/ML injection 75 mL (75 mLs Intravenous Contrast Given 08/12/21 1523)     IMPRESSION / MDM / ASSESSMENT AND PLAN / ED COURSE  I reviewed the triage vital signs and the nursing notes.                              56 y.o. female with past medical history of hypertension, hyperlipidemia, COPD, CHF, chronic hypoxic respiratory failure on 2 L nasal cannula, atrial fibrillation, GERD, PE, and thoracic aortic aneurysm who presents to the ED complaining of increasing difficulty breathing over about the past month with intermittent chest pain for a couple of weeks, worse this morning.  Patient's presentation is most consistent with acute presentation with potential threat to life or bodily function.  Differential diagnosis includes, but is not limited to, ACS, PE, pneumonia, pneumothorax, dissection, COPD exacerbation, CHF exacerbation, noncompliance with home oxygen.  Patient well-appearing and in no acute distress, maintaining oxygen saturations now that she is back on her usual 2 L nasal cannula.  She does have signs of fluid overload with lower extremity edema and crackles on lung exam.  Chest x-ray concerning for cardiomegaly with signs of pulmonary edema and we will diurese with IV Lasix.  EKG shows no evidence of arrhythmia or ischemia and initial troponin is negative, given acute worsening of chest pain this morning we will check second set troponin.  Also plan to check D-dimer given her history of PE, although symptoms seem more consistent with CHF at this time.  Remainder of labs are reassuring with mild hypokalemia, no significant AKI, anemia, or leukocytosis.  D-dimer elevated however CTA shows no evidence of PE or other acute process.  Patient reports feeling much better following dose of IV Lasix and she has ambulated a significant distance on room air here in the emergency department with no drop in her oxygen saturations below 97%.  It appears she has been in the process of  being referred to pulmonology by her PCP to further determine need for supplemental oxygen, however no evidence to suggest patient requires supplemental oxygen at this time.  Symptoms seem most consistent with CHF and she is appropriate for discharge home with PCP follow-up as well as follow-up in the CHF clinic.  She states she is not currently taking Lasix and we will prescribe short course of Lasix until she is able to follow-up in the CHF clinic.  She was counseled to return to the ED for new or worsening symptoms, patient agrees with plan.      FINAL CLINICAL IMPRESSION(S) / ED DIAGNOSES   Final diagnoses:  Shortness of breath  Acute on chronic diastolic congestive heart failure ([place])     Rx /  DC Orders   ED Discharge Orders          Ordered    AMB referral to CHF clinic        08/12/21 1617    furosemide (LASIX) 20 MG tablet  Daily        08/12/21 1618             Note:  This document was prepared using Dragon voice recognition software and may include unintentional dictation errors.   Blake Divine, MD 08/12/21 1620

## 2021-08-12 NOTE — Telephone Encounter (Signed)
Presented to office complaining of chest pain at front desk.  Reports chest pain started at 0900 when in bank -- BP 119/78, P 109, T 98.4, RR 20, O2 98% RA.  Uses O2 at baseline, her machine is broken -- O2 placed at office on 2 L.  Provided one dose NTG in office at 1009 and ASA dose given at 1010.  Ambulance called.    HR 1011 -- 101, O2 sat 97%.    Fire department arrived at 1012.

## 2021-08-12 NOTE — ED Triage Notes (Signed)
Pt to ED from home via [facility] for chest pain since 2 weeks, worse since this morning  and SOB since 1 month. Has been out of home oxygen for 2 months, supposed to wear 2L. Currently on 2L, placed by EMS. First nurse note: EMS gave '324mg'$  aspirin.   Pt states throat ffeels dry and burning. Pt has COPD. Respirations are unlabored. Skin warm, dry.

## 2021-08-12 NOTE — TOC Progression Note (Signed)
Transition of Care [facility]) - Progression Note    Patient Details  Name: [name] MRN: [id number] Date of Birth: Mar 15, 1965  Transition of Care [facility]) CM/SW Contact  Shelbie Hutching, RN Phone Number: 08/12/2021, 4:36 PM  Clinical Narrative:     Patient does not currently qualify for oxygen and [employer] has not had an order since 2020.  Patient walked the ED and maintained her oxygen saturation on room air per provider.  Patient will be discharged and instructed to follow up with pulmonology.  PCP has referred to Pulmonology.        Expected Discharge Plan and Services                                                 Social Determinants of Health (SDOH) Interventions    Readmission Risk Interventions     No data to display

## 2021-08-14 ENCOUNTER — Ambulatory Visit (HOSPITAL_BASED_OUTPATIENT_CLINIC_OR_DEPARTMENT_OTHER): Payer: Medicaid Other | Admitting: Family

## 2021-08-14 ENCOUNTER — Encounter: Payer: Self-pay | Admitting: Family

## 2021-08-14 ENCOUNTER — Telehealth: Payer: Self-pay

## 2021-08-14 ENCOUNTER — Other Ambulatory Visit
Admission: RE | Admit: 2021-08-14 | Discharge: 2021-08-14 | Disposition: A | Discharge status: Home / Self Care | Payer: Medicaid Other | Source: Ambulatory Visit | Attending: Family | Admitting: Family

## 2021-08-14 VITALS — BP 133/105 | HR 84 | Resp 18 | Ht 69.0 in | Wt 218.1 lb

## 2021-08-14 DIAGNOSIS — G4733 Obstructive sleep apnea (adult) (pediatric): Secondary | ICD-10-CM

## 2021-08-14 DIAGNOSIS — I890 Lymphedema, not elsewhere classified: Secondary | ICD-10-CM

## 2021-08-14 DIAGNOSIS — Z79899 Other long term (current) drug therapy: Secondary | ICD-10-CM | POA: Diagnosis not present

## 2021-08-14 DIAGNOSIS — Z9989 Dependence on other enabling machines and devices: Secondary | ICD-10-CM | POA: Insufficient documentation

## 2021-08-14 DIAGNOSIS — Z86711 Personal history of pulmonary embolism: Secondary | ICD-10-CM | POA: Diagnosis not present

## 2021-08-14 DIAGNOSIS — G8929 Other chronic pain: Secondary | ICD-10-CM | POA: Diagnosis not present

## 2021-08-14 DIAGNOSIS — R0789 Other chest pain: Secondary | ICD-10-CM | POA: Diagnosis not present

## 2021-08-14 DIAGNOSIS — I5032 Chronic diastolic (congestive) heart failure: Secondary | ICD-10-CM | POA: Diagnosis not present

## 2021-08-14 DIAGNOSIS — I10 Essential (primary) hypertension: Secondary | ICD-10-CM

## 2021-08-14 DIAGNOSIS — F32A Depression, unspecified: Secondary | ICD-10-CM | POA: Insufficient documentation

## 2021-08-14 DIAGNOSIS — F419 Anxiety disorder, unspecified: Secondary | ICD-10-CM | POA: Diagnosis not present

## 2021-08-14 DIAGNOSIS — Z8673 Personal history of transient ischemic attack (TIA), and cerebral infarction without residual deficits: Secondary | ICD-10-CM | POA: Diagnosis not present

## 2021-08-14 DIAGNOSIS — J449 Chronic obstructive pulmonary disease, unspecified: Secondary | ICD-10-CM | POA: Insufficient documentation

## 2021-08-14 DIAGNOSIS — F329 Major depressive disorder, single episode, unspecified: Secondary | ICD-10-CM

## 2021-08-14 DIAGNOSIS — Z9981 Dependence on supplemental oxygen: Secondary | ICD-10-CM | POA: Insufficient documentation

## 2021-08-14 DIAGNOSIS — R002 Palpitations: Secondary | ICD-10-CM | POA: Insufficient documentation

## 2021-08-14 DIAGNOSIS — M109 Gout, unspecified: Secondary | ICD-10-CM | POA: Insufficient documentation

## 2021-08-14 DIAGNOSIS — F17210 Nicotine dependence, cigarettes, uncomplicated: Secondary | ICD-10-CM | POA: Insufficient documentation

## 2021-08-14 DIAGNOSIS — I110 Hypertensive heart disease with heart failure: Secondary | ICD-10-CM | POA: Insufficient documentation

## 2021-08-14 DIAGNOSIS — I4891 Unspecified atrial fibrillation: Secondary | ICD-10-CM | POA: Diagnosis not present

## 2021-08-14 LAB — BASIC METABOLIC PANEL
Anion gap: 8 (ref 5–15)
BUN: 13 mg/dL (ref 6–20)
CO2: 27 mmol/L (ref 22–32)
Calcium: 9.2 mg/dL (ref 8.9–10.3)
Chloride: 105 mmol/L (ref 98–111)
Creatinine, Ser: 0.88 mg/dL (ref 0.44–1.00)
GFR, Estimated: >60 mL/min (ref >60)
Glucose, Bld: 96 mg/dL (ref 70–99)
Potassium: 3.2 mmol/L — ABNORMAL LOW (ref 3.5–5.1)
Sodium: 140 mmol/L (ref 135–145)

## 2021-08-14 MED ORDER — POTASSIUM CHLORIDE 20 MEQ PO PACK: 20.0000 meq | PACK | Freq: Every day | ORAL | 5 refills | Status: DC

## 2021-08-14 MED ORDER — FUROSEMIDE 20 MG PO TABS: 20.0000 mg | ORAL_TABLET | Freq: Every day | ORAL | 5 refills | Status: DC

## 2021-08-14 NOTE — Patient Instructions (Signed)
Continue weighing daily and call for an overnight weight gain of 3 pounds or more or a weekly weight gain of more than 5 pounds.   If you have voicemail, please make sure your mailbox is cleaned out so that we may leave a message and please make sure to listen to any voicemails.     

## 2021-08-14 NOTE — Progress Notes (Signed)
HPI   [name] is a 56 y/o female with a history of pulmonary embolus, obstructive sleep apnea, atrial fibrillation, COPD, gout, HTN, CVA, remote tobacco use and chronic heart failure.   Echo report from 03/26/2018 reviewed and showed an EF of 60-65% along with mild/ moderate TR. Echo report from 06/17/16 reviewed and showed an EF of 55-65%. Had a pharmacologic stress trest done 09/12/12 and had an estimated EF of 49%.   Catheterization done 03/27/2018 showed normal coronary arteries and normal left ventricular function.  Was in the ED 08/12/21 due to chest pain and shortness of breath due to acute on chronic heart failure. Oxygen machine hasn't been working for prior 2 weeks and she had not been taking furosemide. IV lasix given and her symptoms improved. Given short course of oral lasix and she was released.   She presents today for a follow-up visit although hasn't been seen since Feb 2022. She presents with a chief complaint of moderate shortness of breath with minimal exertion. She describes this as chronic in nature having been present for several years. She has associated fatigue, chest tightness, wheezing, pedal edema, palpitations, chronic pain, depression, anxiety and difficulty sleeping along with this. She denies any dizziness, cough, chest pain, abdominal distention or weight gain.   Says that her biggest complaint is of worsening depression/ anxiety. Says that she's been taking her antidepressant for ~ 2 months now.   Has not taken her medications yet today. Takes potassium very PRN because she says that she has to dissolve it because she can't swallow the tablets. If she needs to take it, she would prefer to try the powder.   Past Medical History:  Diagnosis Date   A-fib [facility]) 2010   Adrenal adenoma, left 10/06/2004   a.) CT 10/06/2004 --> measured 3.1 x 1.6 cm   Alcohol abuse    Allergy    Anginal pain (HCC)    Anxiety    Cardiac murmur    CHF (congestive heart failure) (HCC)     COPD (chronic obstructive pulmonary disease) (HCC)    Diverticulosis    Fatty liver    GERD (gastroesophageal reflux disease)    Gout    History of cocaine abuse ([place])    a.) reports that use discontinued following CVA in 2010.   History of kidney stones    Hypertension    Mild cognitive impairment    Non-healing non-surgical wound    right leg   Obesity    On supplemental oxygen therapy    a.) 2 L/[place]   OSA on CPAP    Pulmonary embolus ([place]) 02/19/2012   a.) CTA --> RIGHT upper lobe.   Stroke [facility] 2010   Thoracic ascending aortic aneurysm ([place])    a.) CT 10/28/2019 --> measured 4.6 cm.   Thyroid disease    Tricuspid valvular regurgitation    a.) TTE 03/26/2018 --> mild to moderate.   Past Surgical History:  Procedure Laterality Date   CYSTOSCOPY WITH STENT PLACEMENT Left 10/02/2020   Procedure: CYSTOSCOPY WITH STENT PLACEMENT;  Surgeon: Billey Co, MD;  Location: [facility] ORS;  Service: Urology;  Laterality: Left;   CYSTOSCOPY/URETEROSCOPY/HOLMIUM LASER/STENT PLACEMENT Left 10/24/2020   Procedure: CYSTOSCOPY/URETEROSCOPY/HOLMIUM LASER/STENT EXCHANGE;  Surgeon: Billey Co, MD;  Location: [facility] ORS;  Service: Urology;  Laterality: Left;   LEFT HEART CATH AND CORONARY ANGIOGRAPHY N/A 03/27/2018   Procedure: LEFT HEART CATH AND CORONARY ANGIOGRAPHY;  Surgeon: Isaias Cowman, MD;  Location: [facility];  Service:  Cardiovascular;  Laterality: N/A;   ORIF ANKLE FRACTURE BIMALLEOLAR Left 09/20/2001   ORIF WRIST FRACTURE Bilateral 2010   Family History  Problem Relation Age of Onset   Prostate cancer Father    Rectal cancer Sister    Breast cancer Sister    Cancer Maternal Grandmother    Cancer Maternal Grandfather    Social History   Tobacco Use   Smoking status: Former    Types: Cigarettes    Quit date: 01/26/2016    Years since quitting: 5.5   Smokeless tobacco: Never  Substance Use Topics   Alcohol use: Yes    Comment: Occassionally   Allergies   Allergen Reactions   Morphine And Related Hives   Penicillins Other (See Comments)    Painful urination Has patient had a PCN reaction causing immediate rash, facial/tongue/throat swelling, SOB or lightheadedness with hypotension: yes Has patient had a PCN reaction causing severe rash involving mucus membranes or skin necrosis: no Has patient had a PCN reaction that required hospitalization no Has patient had a PCN reaction occurring within the last 10 years: no If all of the above answers are "NO", then may proceed with Cephalosporin use.    Prior to Admission medications   Medication Sig Start Date End Date Taking? Authorizing Provider  allopurinol (ZYLOPRIM) 300 MG tablet Take 1 tablet (300 mg total) by mouth daily. 07/13/21  Yes Cannady, Jolene T, NP  amLODipine (NORVASC) 10 MG tablet Take 1 tablet (10 mg total) by mouth daily. 07/31/21  Yes Jon Billings, NP  aspirin EC 81 MG tablet Take 1 tablet (81 mg total) by mouth daily. 10/08/20  Yes Jon Billings, NP  atorvastatin (LIPITOR) 40 MG tablet Take 1 tablet (40 mg total) by mouth daily. 07/31/21  Yes Jon Billings, NP  Cholecalciferol (VITAMIN D3) 25 MCG (1000 UT) CAPS Take 1 capsule (1,000 Units total) by mouth daily. 08/03/21  Yes Jon Billings, NP  citalopram (CELEXA) 10 MG tablet Take 1 tablet (10 mg total) by mouth daily. 07/31/21  Yes Jon Billings, NP  cloNIDine (CATAPRES) 0.1 MG tablet Take 1 tablet (0.1 mg total) by mouth daily. 07/31/21  Yes Jon Billings, NP  colchicine 0.6 MG tablet TAKE ONE (1) TABLET BY MOUTH TWICE DAILY AS NEEDED FOR GOUT FLARES 07/13/21  Yes Jon Billings, NP  gabapentin (NEURONTIN) 300 MG capsule 300 mg qAM, 600 mg qhs 10/08/20  Yes Jon Billings, NP  hydrALAZINE (APRESOLINE) 50 MG tablet Take 1 tablet (50 mg total) by mouth 3 (three) times daily. 07/31/21  Yes Jon Billings, NP  lisinopril (ZESTRIL) 40 MG tablet Take 1 tablet (40 mg total) by mouth daily. 07/31/21  Yes Jon Billings, NP  pantoprazole (PROTONIX) 40 MG tablet Take 1 tablet (40 mg total) by mouth daily. 10/08/20  Yes Jon Billings, NP  potassium chloride (KLOR-CON) 20 MEQ packet Take 20 mEq by mouth daily. 08/14/21  Yes Alisa Graff, FNP  PROAIR HFA 108 (90 Base) MCG/ACT inhaler INHALE TWO (2) PUFFS BY MOUTH EVERY 6 HOURS AS NEEDED FOR WHEEZING OR FOR SHORTNESS OF BREATH 10/08/20  Yes Jon Billings, NP  tiotropium (SPIRIVA HANDIHALER) 18 MCG inhalation capsule Place 1 capsule (18 mcg total) into inhaler and inhale daily. 07/31/21  Yes Jon Billings, NP  furosemide (LASIX) 20 MG tablet Take 1 tablet (20 mg total) by mouth daily. 08/14/21   Alisa Graff, FNP   Review of Systems  Constitutional:  Positive for appetite change (decreased) and fatigue (with moderate exertion).  HENT:  Negative for congestion and postnasal drip.   Eyes:  Positive for visual disturbance (blurry vision).  Respiratory:  Positive for chest tightness, shortness of breath (with minimal exertion) and wheezing. Negative for cough.   Cardiovascular:  Positive for palpitations and leg swelling. Negative for chest pain.  Gastrointestinal:  Negative for abdominal distention and abdominal pain.  Endocrine: Negative.   Genitourinary: Negative.   Musculoskeletal:  Positive for arthralgias (right side of body) and back pain.  Skin: Negative.   Allergic/Immunologic: Negative.   Neurological:  Negative for dizziness and light-headedness.  Hematological:  Negative for adenopathy. Does not bruise/bleed easily.  Psychiatric/Behavioral:  Positive for dysphoric mood and sleep disturbance (wearing CPAP nightly; sleeping on 2 pillows). The patient is nervous/anxious.    Vitals:   08/14/21 1025  BP: (!) 133/105  Pulse: 84  Resp: 18  SpO2: 94%  Weight: 218 lb 2 oz (98.9 kg)  Height: '5\' 9"'$  (1.753 m)   Wt Readings from Last 3 Encounters:  08/14/21 218 lb 2 oz (98.9 kg)  08/12/21 221 lb (100.2 kg)  07/31/21 221 lb 9.6 oz (100.5 kg)    Lab Results  Component Value Date   CREATININE 0.88 08/14/2021   CREATININE 0.78 08/12/2021   CREATININE 0.69 07/31/2021   Physical Exam Vitals and nursing note reviewed.  Constitutional:      Appearance: She is well-developed.  HENT:     Head: Normocephalic and atraumatic.  Neck:     Vascular: No JVD.  Cardiovascular:     Rate and Rhythm: Normal rate and regular rhythm.  Pulmonary:     Effort: Pulmonary effort is normal. No respiratory distress.     Breath sounds: No wheezing or rales.  Abdominal:     General: There is no distension.     Palpations: Abdomen is soft.     Tenderness: There is no abdominal tenderness.  Musculoskeletal:        General: No tenderness.     Cervical back: Normal range of motion and neck supple.     Right lower leg: No tenderness. Edema (trace pitting) present.     Left lower leg: No tenderness. Edema (1+ pitting) present.  Skin:    General: Skin is warm and dry.  Neurological:     Mental Status: She is alert and oriented to person, place, and time.  Psychiatric:        Mood and Affect: Mood is anxious and depressed.        Behavior: Behavior normal.        Thought Content: Thought content normal.   Assessment & Plan:  1: Chronic heart failure with preserved ejection fraction- - NYHA class III - euvolemic today - weighing daily; reminded to call for an overnight weight gain of >2 pounds or a weekly weight gain of >5 pounds.  - not adding salt to her food. Reminded to closely follow a '2000mg'$  sodium diet  - saw cardiology Margarito Courser) 03/18/20 - says that her oxygen tank isn't working; does wear her CPAP at night - emphasized that she continue to contact the oxygen company about fixing this - have scheduled echo for 09/01/21; med changing depending on echo results - BNP 08/12/21 was 55.8  2: HTN- - BP elevated (133/105) but she hasn't taken any of her medicatoins yet today - saw PCP Mathis Dad) 07/31/21 - reviewed BMP done 08/12/21 and it showed  sodium 141, potassium 3.0, creatinine 0.78 and GFR >60 - recheck BMP today  3: Obstructive sleep apnea- - wearing  CPAP nightly - reports sleeping well  4: Lymphedema- - supposed to get left leg wrapped next week at [facility] - encouraged to elevate her legs when sitting for long periods of time  5: Depression/ anxiety- - says that she started her antidepressant ~ 2 months ago but doesn't feel any better - tearful in the office saying that she's "just tired of hurting all the time" - emotional support given and emphasized that she reach out to her PCP regarding this - with her permission, will also reach out to PCP on her behalf   Patient did not bring her medications nor a list. Each medication was verbally reviewed with the patient and she was encouraged to bring the bottles to every visit to confirm accuracy of list.  Return in 1 month, sooner if needed  ADDENDUM: Labs resulted before note finished. Call patient and will continue on furosemide '20mg'$  daily along wit potassium 34mq packet daily. Will recheck labs next visit

## 2021-08-14 NOTE — Telephone Encounter (Addendum)
Spoke with patient about lab results and medication instructions below. Patient verbalized understanding that potassium is still too low and stated she will pick up her new prescriptions. Instructed patient to call if she has any symptoms or concerns arise.  Georg Ruddle, RN ----- Message from Alisa Graff, [place] sent at 08/14/2021 12:30 PM EDT ----- Potassium is still low. Begin 1 potassium packet daily and continue taking your furosemide daily. I[facility] [place]. We will recheck this at your next visit

## 2021-08-20 ENCOUNTER — Ambulatory Visit: Payer: Self-pay

## 2021-08-20 ENCOUNTER — Encounter (INDEPENDENT_AMBULATORY_CARE_PROVIDER_SITE_OTHER): Payer: Medicaid Other | Admitting: Nurse Practitioner

## 2021-08-20 NOTE — Telephone Encounter (Signed)
  Chief Complaint: Chest pain SOB Symptoms: ibid Frequency: ongoing Pertinent Negatives: Patient denies current chest pain COPD Disposition: '[x]'$ ED /'[]'$ Urgent Care (no appt availability in office) / '[]'$ Appointment(In office/virtual)/ '[]'$  [place] [facility]/ '[]'$ Home Care/ '[]'$ Refused Recommended Disposition /'[]'$  Mobile Bus/ '[]'$  Follow-up with PCP Additional Notes: Pt had an event last night that lasted 20-30 minutes of chest pain and SOB. PT had a another at noon today. PT has CHF and COPD.  PT does not have O2 at home. She is requesting an O@ set up and supply be ordered. Reason for Disposition  [1] Chest pain (or "angina") comes and goes AND [2] is happening more often (increasing in frequency) or getting worse (increasing in severity)  (Exception: Chest pains that last only a few seconds.)  Answer Assessment - Initial Assessment Questions 1. LOCATION: "Where does it hurt?"       Chest 2. RADIATION: "Does the pain go anywhere else?" (e.g., into neck, jaw, arms, back)     Throat burning 3. ONSET: "When did the chest pain begin?" (Minutes, hours or days)      Off and on since, the last couple of months 4. PATTERN: "Does the pain come and go, or has it been constant since it started?"  "Does it get worse with exertion?"      Comes and goes 5. DURATION: "How long does it last" (e.g., seconds, minutes, hours)     20-30 minutes 6. SEVERITY: "How bad is the pain?"  (e.g., Scale 1-10; mild, moderate, or severe)    - MILD (1-3): doesn't interfere with normal activities     - MODERATE (4-7): interferes with normal activities or awakens from sleep    - SEVERE (8-10): excruciating pain, unable to do any normal activities       Last night 9/10. At noon today 7/10 7. CARDIAC RISK FACTORS: "Do you have any history of heart problems or risk factors for heart disease?" (e.g., angina, prior heart attack; diabetes, high blood pressure, high cholesterol, smoker, or strong family history of heart  disease)     CHF 8. PULMONARY RISK FACTORS: "Do you have any history of lung disease?"  (e.g., blood clots in lung, asthma, emphysema, birth control pills)     COPD 9. CAUSE: "What do you think is causing the chest pain?"     Unsure 10. OTHER SYMPTOMS: "Do you have any other symptoms?" (e.g., dizziness, nausea, vomiting, sweating, fever, difficulty breathing, cough)       Sweating. 11. PREGNANCY: "Is there any chance you are pregnant?" "When was your last menstrual period?"       na  Protocols used: Chest Pain-A-AH

## 2021-08-25 ENCOUNTER — Other Ambulatory Visit: Payer: Self-pay | Admitting: Nurse Practitioner

## 2021-08-26 NOTE — Telephone Encounter (Signed)
Requested medication (s) are due for refill today: yes  Requested medication (s) are on the active medication list: yes  Last refill:  07/13/21 #30  Future visit scheduled: yes  Notes to clinic:  overdue lab work   Requested Prescriptions  Pending Prescriptions Disp Refills   allopurinol (ZYLOPRIM) 300 MG tablet [Pharmacy Med Name: ALLOPURINOL 300 MG TABLET] 30 tablet 0    Sig: TAKE 1 [place]     Endocrinology:  Gout Agents - allopurinol Failed - 08/25/2021  2:34 AM      Failed - Uric Acid in normal range and within 360 days    Uric Acid  Date Value Ref Range Status  07/02/2019 8.2 (H) 3.0 - 7.2 mg/dL Final    Comment:               Therapeutic target for gout patients: <6.0         Passed - Cr in normal range and within 360 days    Creatinine  Date Value Ref Range Status  01/15/2014 1.36 (H) 0.60 - 1.30 mg/dL Final   Creatinine, Ser  Date Value Ref Range Status  08/14/2021 0.88 0.44 - 1.00 mg/dL Final         Passed - Valid encounter within last 12 months    Recent Outpatient Visits           3 weeks ago Chronic obstructive pulmonary disease, unspecified COPD type ([place])   [place] Jon Billings, NP   4 months ago Chronic pain of right knee   [facility] Jon Billings, NP   4 months ago Lymphedema   [facility] Jon Billings, NP   4 months ago Chronic pain of right knee   [place] Vigg, Avanti, MD   5 months ago [place], Karen, NP       Future Appointments             In 2 months Jon Billings, NP [facility]            Passed - CBC within normal limits and completed in the last 12 months    WBC  Date Value Ref Range Status  08/12/2021 7.2 4.0 - 10.5 K/uL Final   RBC  Date Value Ref Range Status  08/12/2021 4.63 3.87 - 5.11 MIL/uL Final   Hemoglobin  Date Value Ref Range Status  08/12/2021 13.9 12.0 -  15.0 g/dL Final  07/31/2021 13.6 11.1 - 15.9 g/dL Final   HCT  Date Value Ref Range Status  08/12/2021 44.1 36.0 - 46.0 % Final   Hematocrit  Date Value Ref Range Status  07/31/2021 41.8 34.0 - 46.6 % Final   MCHC  Date Value Ref Range Status  08/12/2021 31.5 30.0 - 36.0 g/dL Final   [facility]  Date Value Ref Range Status  08/12/2021 30.0 26.0 - 34.0 pg Final   MCV  Date Value Ref Range Status  08/12/2021 95.2 80.0 - 100.0 fL Final  07/31/2021 93 79 - 97 fL Final  01/15/2014 87 80 - 100 fL Final   No results found for: "PLTCOUNTKUC", "LABPLAT", "POCPLA" RDW  Date Value Ref Range Status  08/12/2021 16.1 (H) 11.5 - 15.5 % Final  07/31/2021 15.2 11.7 - 15.4 % Final  01/15/2014 16.0 (H) 11.5 - 14.5 % Final

## 2021-09-01 ENCOUNTER — Other Ambulatory Visit: Payer: Self-pay

## 2021-09-01 ENCOUNTER — Emergency Department: Payer: Medicaid Other

## 2021-09-01 ENCOUNTER — Telehealth: Payer: Self-pay | Admitting: Nurse Practitioner

## 2021-09-01 ENCOUNTER — Encounter: Payer: Self-pay | Admitting: Emergency Medicine

## 2021-09-01 ENCOUNTER — Ambulatory Visit: Admission: RE | Admit: 2021-09-01 | Payer: Medicaid Other | Source: Ambulatory Visit

## 2021-09-01 ENCOUNTER — Emergency Department
Admission: EM | Admit: 2021-09-01 | Discharge: 2021-09-01 | Disposition: A | Discharge status: Home / Self Care | Payer: Medicaid Other | Attending: Emergency Medicine | Admitting: Emergency Medicine

## 2021-09-01 DIAGNOSIS — I509 Heart failure, unspecified: Secondary | ICD-10-CM | POA: Diagnosis not present

## 2021-09-01 DIAGNOSIS — R0602 Shortness of breath: Secondary | ICD-10-CM | POA: Diagnosis present

## 2021-09-01 DIAGNOSIS — R600 Localized edema: Secondary | ICD-10-CM | POA: Diagnosis not present

## 2021-09-01 DIAGNOSIS — I110 Hypertensive heart disease with heart failure: Secondary | ICD-10-CM | POA: Diagnosis not present

## 2021-09-01 LAB — CBC
HCT: 45.6 % (ref 36.0–46.0)
Hemoglobin: 14.4 g/dL (ref 12.0–15.0)
MCH: 29.8 pg (ref 26.0–34.0)
MCHC: 31.6 g/dL (ref 30.0–36.0)
MCV: 94.4 fL (ref 80.0–100.0)
Platelets: 234 10*3/uL (ref 150–400)
RBC: 4.83 MIL/uL (ref 3.87–5.11)
RDW: 15.5 % (ref 11.5–15.5)
WBC: 7.7 10*3/uL (ref 4.0–10.5)
nRBC: 0 % (ref 0.0–0.2)

## 2021-09-01 LAB — BASIC METABOLIC PANEL
Anion gap: 10 (ref 5–15)
BUN: 17 mg/dL (ref 6–20)
CO2: 26 mmol/L (ref 22–32)
Calcium: 9 mg/dL (ref 8.9–10.3)
Chloride: 108 mmol/L (ref 98–111)
Creatinine, Ser: 0.88 mg/dL (ref 0.44–1.00)
GFR, Estimated: >60 mL/min (ref >60)
Glucose, Bld: 114 mg/dL — ABNORMAL HIGH (ref 70–99)
Potassium: 3.3 mmol/L — ABNORMAL LOW (ref 3.5–5.1)
Sodium: 144 mmol/L (ref 135–145)

## 2021-09-01 LAB — BRAIN NATRIURETIC PEPTIDE: B Natriuretic Peptide: 135.3 pg/mL — ABNORMAL HIGH (ref 0.0–100.0)

## 2021-09-01 LAB — PROTIME-INR
INR: 0.9 (ref 0.8–1.2)
Prothrombin Time: 12.2 seconds (ref 11.4–15.2)

## 2021-09-01 LAB — TROPONIN I (HIGH SENSITIVITY): Troponin I (High Sensitivity): 11 ng/L (ref <18)

## 2021-09-01 MED ORDER — FUROSEMIDE 10 MG/ML IJ SOLN
60.0000 mg | Freq: Once | INTRAMUSCULAR | Status: AC
  Administered 2021-09-01: 60 mg via INTRAVENOUS
  Filled 2021-09-01: qty 8

## 2021-09-01 NOTE — Telephone Encounter (Signed)
Patient advised to call the echocardiogram department and let them know.

## 2021-09-01 NOTE — ED Triage Notes (Signed)
Patient to ED via [facility] for SOB. Patient is suppose to wear 2L at home but tank broke- 3 months ago. Has been unable to get tank fixed. NAD noted on 2L [place] at this time.

## 2021-09-01 NOTE — ED Provider Notes (Signed)
[facility] Provider Note    Event Date/Time   First MD Initiated Contact with Patient 09/01/21 1228     (approximate)  History   Chief Complaint: Shortness of Breath  HPI  [name] is a 56 y.o. female with a past medical history of paroxysmal atrial fibrillation, CHF, gastric reflux, hypertension, CVA, presents to the emergency department for shortness of breath.  According to the patient over the past several months she has had some worsening shortness of breath as well as intermittent lower extremity swelling.  Patient has been taking her 20 mg of Lasix daily.  Patient states she is prescribed 2 L of oxygen to be used at home however her home oxygen tank broke 3 months ago and they have unable to get it replaced.  Patient is currently on 2 L in the emergency department states she feels much better and denies any shortness of breath at this time.  Physical Exam   Triage Vital Signs: ED Triage Vitals [09/01/21 1013]  Enc Vitals Group     BP (!) 140/96     Pulse Rate 82     Resp 18     Temp 98.1 F (36.7 C)     Temp Source Oral     SpO2 99 %     Weight      Height      Head Circumference      Peak Flow      Pain Score      Pain Loc      Pain Edu?      Excl. in [place]?     Most recent vital signs: Vitals:   09/01/21 1013  BP: (!) 140/96  Pulse: 82  Resp: 18  Temp: 98.1 F (36.7 C)  SpO2: 99%    General: Awake, no distress.  CV:  Good peripheral perfusion.  Regular rate and rhythm  Resp:  Normal effort.  Equal breath sounds bilaterally.  No obvious wheeze. Abd:  No distention.  Soft, nontender.  No rebound or guarding. Other:  Mild pedal edema bilaterally.   ED Results / Procedures / Treatments   EKG  EKG viewed and interpreted by myself shows a sinus rhythm at 83 bpm with a narrow QRS, normal axis, normal intervals, nonspecific but no concerning ST changes.  RADIOLOGY  I have viewed and interpreted the x-ray images patient  appears to have some mild interstitial edema bilaterally but no obvious consolidation noted. Radiology is read the x-ray is consistent with cardiomegaly with vascular congestion.   MEDICATIONS ORDERED IN ED: Medications  furosemide (LASIX) injection 60 mg (has no administration in time range)     IMPRESSION / MDM / ASSESSMENT AND PLAN / ED COURSE  I reviewed the triage vital signs and the nursing notes.  Patient's presentation is most consistent with acute presentation with potential threat to life or bodily function.  Patient presents to the emergency department for shortness of breath.  Here in the emergency department patient appears well clear lung sounds no obvious wheeze or rales.  Patient does have some lower extremity edema.  Chest x-ray shows vascular congestion.  Patient's BNP is elevated compared to 2 weeks ago.  Patient satting well on 2 L which she is prescribed at home but states her home oxygen tank is broken.  Patient denies any symptoms while she is wearing 2 L and states her shortness of breath has resolved since starting back on oxygen in the emergency department.  Patient's  chest x-ray does show some vascular congestion with a BNP elevated we will dose 60 mg of IV Lasix in the emergency department.  Patient's lab work is thus far reassuring besides a slightly elevated BNP patient CBC and chemistry are reassuring.  I have added on a troponin as a precaution.  I have also placed a consult to social work to see what we can do about getting the patient's home oxygen fixed or replaced.  If we are able to get the patient's home oxygen replaced I believe the patient could be discharged home.  I would recommend increasing her Lasix from 20 mg daily to 40 mg daily for the next 5 days and following up with her doctor.  Patient is agreeable.  Patient's troponin is negative.  I have been working with the Education officer, museum to try to get oxygen arranged for the patient.  Social worker is currently  trying to get oxygen arranged for the patient however the patient now states she has an appointment at 3:00 and she needs to leave.  Patient understands that we have not been able to set up oxygen for home use for her however she still wishes to leave.  We will discharge the patient per her wishes.  I discussed the patient to double her Lasix for the next 5 days.  FINAL CLINICAL IMPRESSION(S) / ED DIAGNOSES   Dyspnea CHF exacerbation   Note:  This document was prepared using Dragon voice recognition software and may include unintentional dictation errors.   Harvest Dark, MD 09/01/21 [phone]

## 2021-09-01 NOTE — Telephone Encounter (Signed)
She needs to contact the ECHO department to get that cancelled.

## 2021-09-01 NOTE — TOC Initial Note (Signed)
Transition of Care [facility]) - Initial/Assessment Note    Patient Details  Name: [name] MRN: [id number] Date of Birth: Apr 05, 1965  Transition of Care [facility]) CM/SW Contact:    Shelbie Hutching, RN Phone Number: 09/01/2021, 1:55 PM  Clinical Narrative:                 Patient came into the hospital with shortness of breath.  Patient reports to MD and staff that she has oxygen at home but that it is broken and not working.  She reported that her PCP ordered the O2.  RNCM called [facility] in [place].  They have not put in an order for oxygen.  Last ER vist on July 19th patient did not qualify for home oxygen and was instructed to follow up with pulmonology.  Patient has had oxygen in the past but not currently.          Patient Goals and CMS Choice        Expected Discharge Plan and Services                                                Prior Living Arrangements/Services                       Activities of Daily Living      Permission Sought/Granted                  Emotional Assessment              Admission diagnosis:  sob ems Patient Active Problem List   Diagnosis Date Noted   Chronic pain of right knee 04/15/2021   Anxiety 03/03/2021   OSA on CPAP 04/21/2020   Thoracic ascending aortic aneurysm ([place]) 03/03/2020   Hearing loss due to cerumen impaction, left 07/23/2019   Lymphedema 12/06/2018   IFG (impaired fasting glucose) 11/03/2018   Hallucinations, visual 07/03/2018   Unstable angina ([place]) 03/25/2018   Gout 05/09/2017   COPD (chronic obstructive pulmonary disease) ([place]) 04/10/2017   History of pulmonary embolism 04/10/2017   History of thyroid disease 04/10/2017   Hyperlipidemia 12/31/2016   Essential hypertension 05/26/2016   Chronic diastolic heart failure ([place]) 05/26/2016   Adrenal adenoma 09/28/2012   Esophageal reflux 12/23/2008   PCP:  Jon Billings, NP Pharmacy[facility]   [id number] [place]  [address] [place]Phone: [phone]: [address]Phone: [phone]: [phone]    Social Determinants of Health (SDOH) Interventions    Readmission Risk Interventions     No data to display

## 2021-09-01 NOTE — ED Triage Notes (Signed)
Pt here via [facility] with SOB. Pt has swelling to her upper and lower extremities. Pt has CHF, pt oxygen broken at home but wears 2L at home. Pt denies wheezing or pain. Pt abd swelling due to fluid buildup.   95% on RA

## 2021-09-01 NOTE — Discharge Instructions (Signed)
As we discussed please double your Lasix from 20 mg daily to 40 mg daily for the next 5 days.  Please follow-up with your doctor this week for recheck/reevaluation.  Return to the emergency department for any worsening shortness of breath any chest pain or any other symptom personally concerning to yourself.

## 2021-09-01 NOTE — Telephone Encounter (Signed)
Copied from [place] [id number]. Topic: Appointment Scheduling - Scheduling Inquiry for Clinic >> Sep 01, 2021  9:48 AM Cyndi Bender wrote: Reason for CRM: Pt reports that she was admitted in to the hospital today so she will not be able to make the echocardiogram appt scheduled at 11 am.

## 2021-09-10 ENCOUNTER — Encounter (INDEPENDENT_AMBULATORY_CARE_PROVIDER_SITE_OTHER): Payer: Self-pay | Admitting: Nurse Practitioner

## 2021-09-10 ENCOUNTER — Ambulatory Visit (INDEPENDENT_AMBULATORY_CARE_PROVIDER_SITE_OTHER): Payer: Medicaid Other | Admitting: Nurse Practitioner

## 2021-09-10 ENCOUNTER — Ambulatory Visit: Payer: Medicaid Other | Admitting: Family

## 2021-09-10 VITALS — BP 167/127 | HR 75 | Resp 16 | Ht 70.0 in | Wt 223.0 lb

## 2021-09-10 DIAGNOSIS — E785 Hyperlipidemia, unspecified: Secondary | ICD-10-CM

## 2021-09-10 DIAGNOSIS — I10 Essential (primary) hypertension: Secondary | ICD-10-CM | POA: Diagnosis not present

## 2021-09-10 DIAGNOSIS — I890 Lymphedema, not elsewhere classified: Secondary | ICD-10-CM | POA: Diagnosis not present

## 2021-09-28 ENCOUNTER — Encounter (INDEPENDENT_AMBULATORY_CARE_PROVIDER_SITE_OTHER): Payer: Self-pay | Admitting: Nurse Practitioner

## 2021-09-28 NOTE — Progress Notes (Signed)
Subjective:    Patient ID: [name], female    DOB: [birth year], 56 y.o.   MRN: [id number] Chief Complaint  Patient presents with   Establish Care    Referred by dr Manson Passey [name] is a 56 year old female who presents today for follow-up for lower extremity lymphedema.  She notes that she has been having some worsening swelling and wounds.  The patient notes that she has not been as diligent with her conservative therapy tactics including use of compression and elevation.  Currently there are no open wounds or ulcerations.    Review of Systems  Cardiovascular:  Positive for leg swelling.  All other systems reviewed and are negative.      Objective:   Physical Exam Vitals reviewed.  HENT:     Head: Normocephalic.  Cardiovascular:     Rate and Rhythm: Normal rate.  Pulmonary:     Effort: Pulmonary effort is normal.  Skin:    General: Skin is warm and dry.  Neurological:     Mental Status: She is alert and oriented to person, place, and time.  Psychiatric:        Mood and Affect: Mood normal.        Behavior: Behavior normal.        Thought Content: Thought content normal.        Judgment: Judgment normal.     BP (!) 167/127 (BP Location: Right Arm)   Pulse 75   Resp 16   Ht '5\' 10"'$  (1.778 m)   Wt 223 lb (101.2 kg)   BMI 32.00 kg/m   Past Medical History:  Diagnosis Date   A-fib ([place]) 2010   Adrenal adenoma, left 10/06/2004   a.) CT 10/06/2004 --> measured 3.1 x 1.6 cm   Alcohol abuse    Allergy    Anginal pain (HCC)    Anxiety    Cardiac murmur    CHF (congestive heart failure) (HCC)    COPD (chronic obstructive pulmonary disease) ([place])    Diverticulosis    Fatty liver    GERD (gastroesophageal reflux disease)    Gout    History of cocaine abuse ([place])    a.) reports that use discontinued following CVA in 2010.   History of kidney stones    Hypertension    Mild cognitive impairment    Non-healing non-surgical wound    right leg    Obesity    On supplemental oxygen therapy    a.) 2 L/[place]   OSA on CPAP    Pulmonary embolus ([place]) 02/19/2012   a.) CTA --> RIGHT upper lobe.   Stroke [facility]) 2010   Thoracic ascending aortic aneurysm ([place])    a.) CT 10/28/2019 --> measured 4.6 cm.   Thyroid disease    Tricuspid valvular regurgitation    a.) TTE 03/26/2018 --> mild to moderate.    Social History   Socioeconomic History   Marital status: Legally Separated    Spouse name: Not on file   Number of children: Not on file   Years of education: Not on file   Highest education level: Not on file  Occupational History   Not on file  Tobacco Use   Smoking status: Former    Types: Cigarettes    Quit date: 01/26/2016    Years since quitting: 5.6   Smokeless tobacco: Never  Vaping Use   Vaping Use: Never used  Substance and Sexual Activity   Alcohol use: Yes  Comment: Occassionally   Drug use: No   Sexual activity: Never  Other Topics Concern   Not on file  Social History Narrative   Not on file   Social Determinants of Health   Financial Resource Strain: Not on file  Food Insecurity: Not on file  Transportation Needs: Not on file  Physical Activity: Not on file  Stress: Not on file  Social Connections: Not on file  Intimate Partner Violence: Not on file    Past Surgical History:  Procedure Laterality Date   CYSTOSCOPY WITH STENT PLACEMENT Left 10/02/2020   Procedure: [place];  Surgeon: Billey Co, MD;  Location: [facility] ORS;  Service: Urology;  Laterality: Left;   CYSTOSCOPY/URETEROSCOPY/HOLMIUM LASER/STENT PLACEMENT Left 10/24/2020   Procedure: CYSTOSCOPY/URETEROSCOPY/HOLMIUM LASER/STENT EXCHANGE;  Surgeon: Billey Co, MD;  Location: [facility] ORS;  Service: Urology;  Laterality: Left;   LEFT HEART CATH AND CORONARY ANGIOGRAPHY N/A 03/27/2018   Procedure: LEFT HEART CATH AND CORONARY ANGIOGRAPHY;  Surgeon: Isaias Cowman, MD;  Location: [place] [facility];  Service:  Cardiovascular;  Laterality: N/A;   ORIF ANKLE FRACTURE BIMALLEOLAR Left 09/20/2001   ORIF WRIST FRACTURE Bilateral 2010    Family History  Problem Relation Age of Onset   Prostate cancer Father    Rectal cancer Sister    Breast cancer Sister    Cancer Maternal Grandmother    Cancer Maternal Grandfather     Allergies  Allergen Reactions   Morphine And Related Hives   Penicillins Other (See Comments)    Painful urination Has patient had a PCN reaction causing immediate rash, facial/tongue/throat swelling, SOB or lightheadedness with hypotension: yes Has patient had a PCN reaction causing severe rash involving mucus membranes or skin necrosis: no Has patient had a PCN reaction that required hospitalization no Has patient had a PCN reaction occurring within the last 10 years: no If all of the above answers are "NO", then may proceed with Cephalosporin use.        Latest Ref Rng & Units 09/01/2021   10:16 AM 08/12/2021   10:51 AM 07/31/2021   11:24 AM  CBC  WBC 4.0 - 10.5 K/uL 7.7  7.2  7.9   Hemoglobin 12.0 - 15.0 g/dL 14.4  13.9  13.6   Hematocrit 36.0 - 46.0 % 45.6  44.1  41.8   Platelets 150 - 400 K/uL 234  244  246       CMP     Component Value Date/Time   NA 144 09/01/2021 1016   NA 143 07/31/2021 1124   NA 139 01/15/2014 0527   K 3.3 (L) 09/01/2021 1016   K 3.9 01/15/2014 0527   CL 108 09/01/2021 1016   CL 105 01/15/2014 0527   CO2 26 09/01/2021 1016   CO2 28 01/15/2014 0527   GLUCOSE 114 (H) 09/01/2021 1016   GLUCOSE 90 01/15/2014 0527   BUN 17 09/01/2021 1016   BUN 12 07/31/2021 1124   BUN 17 01/15/2014 0527   CREATININE 0.88 09/01/2021 1016   CREATININE 1.36 (H) 01/15/2014 0527   CALCIUM 9.0 09/01/2021 1016   CALCIUM 8.6 01/15/2014 0527   PROT 7.0 07/31/2021 1124   PROT 7.5 01/14/2014 0117   ALBUMIN 4.2 07/31/2021 1124   ALBUMIN 3.2 (L) 01/14/2014 0117   AST 10 07/31/2021 1124   AST 43 (H) 01/14/2014 0117   ALT 7 07/31/2021 1124   ALT 39  01/14/2014 0117   ALKPHOS 93 07/31/2021 1124   ALKPHOS 93 01/14/2014  0117   BILITOT 1.3 (H) 07/31/2021 1124   BILITOT 0.2 01/14/2014 0117   GFRNONAA >60 09/01/2021 1016   GFRNONAA 44 (L) 01/15/2014 0527   GFRNONAA >60 08/21/2013 0337   GFRAA >60 10/13/2019 1432   GFRAA 54 (L) 01/15/2014 0527   GFRAA >60 08/21/2013 0337     No results found.     Assessment & Plan:   1. Lymphedema The patient has been having some worsening lower extremity edema.  We discussed conservative therapy tactics that must be undertaken for presumed lymphedema.  They need to continue to use medical grade compression stockings, ideally 20 to 30 mmHg, daily.  Patient put these on first thing in the morning and take a walk for imaging.  He should not sleep in these.  Patient elevate her lower extremities when not active.  Patient also started for 20 to 30 minutes of activity 3 to 4 days/week.  The patient has a lymphedema pump.  She should try to utilize this for an hour during the evenings.  I discussed with the patient that currently there is no cure for lymphedema as well as there is typically no medicine that will make her lymphedema completely resolved.  We will have the patient return and have bilateral venous reflux studies to ensure there is not any venous component to her edema.  2. Essential hypertension Continue antihypertensive medications as already ordered, these medications have been reviewed and there are no changes at this time.   3. Hyperlipidemia, unspecified hyperlipidemia type Continue statin as ordered and reviewed, no changes at this time    Current Outpatient Medications on File Prior to Visit  Medication Sig Dispense Refill   allopurinol (ZYLOPRIM) 300 MG tablet TAKE 1 TABLET BY MOUTH EVERY DAY 30 tablet 0   amLODipine (NORVASC) 10 MG tablet Take 1 tablet (10 mg total) by mouth daily. 90 tablet 1   aspirin EC 81 MG tablet Take 1 tablet (81 mg total) by mouth daily. 30 tablet 1    atorvastatin (LIPITOR) 40 MG tablet Take 1 tablet (40 mg total) by mouth daily. 90 tablet 1   Cholecalciferol (VITAMIN D3) 25 MCG (1000 UT) CAPS Take 1 capsule (1,000 Units total) by mouth daily. 60 capsule 1   citalopram (CELEXA) 10 MG tablet Take 1 tablet (10 mg total) by mouth daily. 90 tablet 1   cloNIDine (CATAPRES) 0.1 MG tablet Take 1 tablet (0.1 mg total) by mouth daily. 90 tablet 1   colchicine 0.6 MG tablet TAKE ONE (1) TABLET BY MOUTH TWICE DAILY AS NEEDED FOR GOUT FLARES 90 tablet 0   furosemide (LASIX) 20 MG tablet Take 1 tablet (20 mg total) by mouth daily. 30 tablet 5   gabapentin (NEURONTIN) 300 MG capsule 300 mg qAM, 600 mg qhs 90 capsule 5   hydrALAZINE (APRESOLINE) 50 MG tablet Take 1 tablet (50 mg total) by mouth 3 (three) times daily. 90 tablet 1   lisinopril (ZESTRIL) 40 MG tablet Take 1 tablet (40 mg total) by mouth daily. 90 tablet 1   pantoprazole (PROTONIX) 40 MG tablet Take 1 tablet (40 mg total) by mouth daily. 30 tablet 6   potassium chloride (KLOR-CON) 20 MEQ packet Take 20 mEq by mouth daily. 30 packet 5   PROAIR HFA 108 (90 Base) MCG/ACT inhaler INHALE TWO (2) PUFFS BY MOUTH EVERY 6 HOURS AS NEEDED FOR WHEEZING OR FOR SHORTNESS OF BREATH 8.5 g 6   tiotropium (SPIRIVA HANDIHALER) 18 MCG inhalation capsule Place 1 capsule (18 mcg  total) into inhaler and inhale daily. 30 capsule 1   No current facility-administered medications on file prior to visit.    There are no Patient Instructions on file for this visit. No follow-ups on file.   Kris Hartmann, NP

## 2021-09-29 ENCOUNTER — Other Ambulatory Visit: Payer: Self-pay | Admitting: Nurse Practitioner

## 2021-09-30 NOTE — Telephone Encounter (Signed)
Requested medication (s) are due for refill today: yes  Requested medication (s) are on the active medication list: yes    Last refill: 08/26/21 #30 0 refills  Future visit scheduled yes 11/02/21  Notes to clinic:Failed due to labs, please review. Thank you.  Requested Prescriptions  Pending Prescriptions Disp Refills   allopurinol (ZYLOPRIM) 300 MG tablet [Pharmacy Med Name: ALLOPURINOL 300 MG TABLET] 30 tablet 0    Sig: TAKE 1 TABLET BY MOUTH [place] DAY     Endocrinology:  Gout Agents - allopurinol Failed - 09/29/2021  2:10 AM      Failed - Uric Acid in normal range and within 360 days    Uric Acid  Date Value Ref Range Status  07/02/2019 8.2 (H) 3.0 - 7.2 mg/dL Final    Comment:               Therapeutic target for gout patients: <6.0         Passed - Cr in normal range and within 360 days    Creatinine  Date Value Ref Range Status  01/15/2014 1.36 (H) 0.60 - 1.30 mg/dL Final   Creatinine, Ser  Date Value Ref Range Status  09/01/2021 0.88 0.44 - 1.00 mg/dL Final         Passed - Valid encounter within last 12 months    Recent Outpatient Visits           2 months ago Chronic obstructive pulmonary disease, unspecified COPD type ([place])   [place] Jon Billings, NP   5 months ago Chronic pain of right knee   [facility] Jon Billings, NP   5 months ago Lymphedema   [facility] Jon Billings, NP   5 months ago Chronic pain of right knee   [place], Avanti, MD   7 months ago [place], Karen, NP       Future Appointments             In 1 month Jon Billings, NP [facility]            Passed - CBC within normal limits and completed in the last 12 months    WBC  Date Value Ref Range Status  09/01/2021 7.7 4.0 - 10.5 K/uL Final   RBC  Date Value Ref Range Status  09/01/2021 4.83 3.87 - 5.11 MIL/uL Final   Hemoglobin  Date Value  Ref Range Status  09/01/2021 14.4 12.0 - 15.0 g/dL Final  07/31/2021 13.6 11.1 - 15.9 g/dL Final   HCT  Date Value Ref Range Status  09/01/2021 45.6 36.0 - 46.0 % Final   Hematocrit  Date Value Ref Range Status  07/31/2021 41.8 34.0 - 46.6 % Final   MCHC  Date Value Ref Range Status  09/01/2021 31.6 30.0 - 36.0 g/dL Final   [facility]  Date Value Ref Range Status  09/01/2021 29.8 26.0 - 34.0 pg Final   MCV  Date Value Ref Range Status  09/01/2021 94.4 80.0 - 100.0 fL Final  07/31/2021 93 79 - 97 fL Final  01/15/2014 87 80 - 100 fL Final   No results found for: "PLTCOUNTKUC", "LABPLAT", "POCPLA" RDW  Date Value Ref Range Status  09/01/2021 15.5 11.5 - 15.5 % Final  07/31/2021 15.2 11.7 - 15.4 % Final  01/15/2014 16.0 (H) 11.5 - 14.5 % Final

## 2021-10-13 ENCOUNTER — Other Ambulatory Visit (INDEPENDENT_AMBULATORY_CARE_PROVIDER_SITE_OTHER): Payer: Self-pay | Admitting: Nurse Practitioner

## 2021-10-13 DIAGNOSIS — R600 Localized edema: Secondary | ICD-10-CM

## 2021-10-13 NOTE — Progress Notes (Deleted)
HPI   [name] is a 56 y/o female with a history of pulmonary embolus, obstructive sleep apnea, atrial fibrillation, COPD, gout, HTN, CVA, remote tobacco use and chronic heart failure.   Echo report from 03/26/2018 reviewed and showed an EF of 60-65% along with mild/ moderate TR. Echo report from 06/17/16 reviewed and showed an EF of 55-65%. Had a pharmacologic stress trest done 09/12/12 and had an estimated EF of 49%.   Catheterization done 03/27/2018 showed normal coronary arteries and normal left ventricular function.  Was in the ED 09/01/21 due to shortness of breath. Oxygen tank at home had broke. Placed on 2L in the ED. IV lasix given. Lasix doubled and she was released. Was in the ED 08/12/21 due to chest pain and shortness of breath due to acute on chronic heart failure. Oxygen machine hasn't been working for prior 2 weeks and she had not been taking furosemide. IV lasix given and her symptoms improved. Given short course of oral lasix and she was released.   She presents today for a follow-up visit with a chief complaint of    Past Medical History:  Diagnosis Date   A-fib [facility]) 2010   Adrenal adenoma, left 10/06/2004   a.) CT 10/06/2004 --> measured 3.1 x 1.6 cm   Alcohol abuse    Allergy    Anginal pain (HCC)    Anxiety    Cardiac murmur    CHF (congestive heart failure) (HCC)    COPD (chronic obstructive pulmonary disease) ([place])    Diverticulosis    Fatty liver    GERD (gastroesophageal reflux disease)    Gout    History of cocaine abuse ([place])    a.) reports that use discontinued following CVA in 2010.   History of kidney stones    Hypertension    Mild cognitive impairment    Non-healing non-surgical wound    right leg   Obesity    On supplemental oxygen therapy    a.) 2 L/[place]   OSA on CPAP    Pulmonary embolus ([place]) 02/19/2012   a.) CTA --> RIGHT upper lobe.   Stroke [facility]) 2010   Thoracic ascending aortic aneurysm ([place])    a.) CT 10/28/2019 --> measured 4.6 cm.    Thyroid disease    Tricuspid valvular regurgitation    a.) TTE 03/26/2018 --> mild to moderate.   Past Surgical History:  Procedure Laterality Date   CYSTOSCOPY WITH STENT PLACEMENT Left 10/02/2020   Procedure: CYSTOSCOPY WITH STENT PLACEMENT;  Surgeon: Billey Co, MD;  Location: [facility] ORS;  Service: Urology;  Laterality: Left;   CYSTOSCOPY/URETEROSCOPY/HOLMIUM LASER/STENT PLACEMENT Left 10/24/2020   Procedure: CYSTOSCOPY/URETEROSCOPY/HOLMIUM LASER/STENT EXCHANGE;  Surgeon: Billey Co, MD;  Location: [facility] ORS;  Service: Urology;  Laterality: Left;   LEFT HEART CATH AND CORONARY ANGIOGRAPHY N/A 03/27/2018   Procedure: LEFT HEART CATH AND CORONARY ANGIOGRAPHY;  Surgeon: Isaias Cowman, MD;  Location: [place] CV LAB;  Service: Cardiovascular;  Laterality: N/A;   ORIF ANKLE FRACTURE BIMALLEOLAR Left 09/20/2001   ORIF WRIST FRACTURE Bilateral 2010   Family History  Problem Relation Age of Onset   Prostate cancer Father    Rectal cancer Sister    Breast cancer Sister    Cancer Maternal Grandmother    Cancer Maternal Grandfather    Social History   Tobacco Use   Smoking status: Former    Types: Cigarettes    Quit date: 01/26/2016    Years since quitting: 5.7   Smokeless tobacco:  Never  Substance Use Topics   Alcohol use: Yes    Comment: Occassionally   Allergies  Allergen Reactions   Morphine And Related Hives   Penicillins Other (See Comments)    Painful urination Has patient had a PCN reaction causing immediate rash, facial/tongue/throat swelling, SOB or lightheadedness with hypotension: yes Has patient had a PCN reaction causing severe rash involving mucus membranes or skin necrosis: no Has patient had a PCN reaction that required hospitalization no Has patient had a PCN reaction occurring within the last 10 years: no If all of the above answers are "NO", then may proceed with Cephalosporin use.     Review of Systems  Constitutional:  Positive for  appetite change (decreased) and fatigue (with moderate exertion).  HENT:  Negative for congestion and postnasal drip.   Eyes:  Positive for visual disturbance (blurry vision).  Respiratory:  Positive for chest tightness, shortness of breath (with minimal exertion) and wheezing. Negative for cough.   Cardiovascular:  Positive for palpitations and leg swelling. Negative for chest pain.  Gastrointestinal:  Negative for abdominal distention and abdominal pain.  Endocrine: Negative.   Genitourinary: Negative.   Musculoskeletal:  Positive for arthralgias (right side of body) and back pain.  Skin: Negative.   Allergic/Immunologic: Negative.   Neurological:  Negative for dizziness and light-headedness.  Hematological:  Negative for adenopathy. Does not bruise/bleed easily.  Psychiatric/Behavioral:  Positive for dysphoric mood and sleep disturbance (wearing CPAP nightly; sleeping on 2 pillows). The patient is nervous/anxious.      Physical Exam Vitals and nursing note reviewed.  Constitutional:      Appearance: She is well-developed.  HENT:     Head: Normocephalic and atraumatic.  Neck:     Vascular: No JVD.  Cardiovascular:     Rate and Rhythm: Normal rate and regular rhythm.  Pulmonary:     Effort: Pulmonary effort is normal. No respiratory distress.     Breath sounds: No wheezing or rales.  Abdominal:     General: There is no distension.     Palpations: Abdomen is soft.     Tenderness: There is no abdominal tenderness.  Musculoskeletal:        General: No tenderness.     Cervical back: Normal range of motion and neck supple.     Right lower leg: No tenderness. Edema (trace pitting) present.     Left lower leg: No tenderness. Edema (1+ pitting) present.  Skin:    General: Skin is warm and dry.  Neurological:     Mental Status: She is alert and oriented to person, place, and time.  Psychiatric:        Mood and Affect: Mood is anxious and depressed.        Behavior: Behavior  normal.        Thought Content: Thought content normal.   Assessment & Plan:  1: Chronic heart failure with preserved ejection fraction- - NYHA class III - euvolemic today - weighing daily; reminded to call for an overnight weight gain of >2 pounds or a weekly weight gain of >5 pounds.  - weight 218.2 from last visit here 2 months ago - not adding salt to her food. Reminded to closely follow a '2000mg'$  sodium diet  - saw cardiology Margarito Courser) 03/18/20 - have scheduled echo for 09/01/21; med changing depending on echo results - BNP 09/01/21 was 135.3  2: HTN- - BP  - saw PCP Mathis Dad) 07/31/21 - reviewed BMP done 09/01/21 and it showed sodium 144,  potassium 3.3, creatinine 0.88 and GFR >60 - recheck BMP today  3: Obstructive sleep apnea- - wearing CPAP nightly - reports sleeping well  4: Lymphedema- - supposed to get left leg wrapped next week at vein & vascular - encouraged to elevate her legs when sitting for long periods of time - saw vascular Owens Shark) 09/10/21  5: Depression/ anxiety- - says that she started her antidepressant ~ 2 months ago but doesn't feel any better - tearful in the office saying that she's "just tired of hurting all the time" - emotional support given and emphasized that she reach out to her PCP regarding this - with her permission, will also reach out to PCP on her behalf   Patient did not bring her medications nor a list. Each medication was verbally reviewed with the patient and she was encouraged to bring the bottles to every visit to confirm accuracy of list.

## 2021-10-14 ENCOUNTER — Encounter: Payer: Self-pay | Admitting: Family

## 2021-10-14 ENCOUNTER — Encounter: Payer: Self-pay | Admitting: Pharmacist

## 2021-10-14 ENCOUNTER — Ambulatory Visit (INDEPENDENT_AMBULATORY_CARE_PROVIDER_SITE_OTHER): Payer: Medicaid Other | Admitting: Nurse Practitioner

## 2021-10-14 ENCOUNTER — Ambulatory Visit: Payer: Medicaid Other | Attending: Family | Admitting: Family

## 2021-10-14 ENCOUNTER — Ambulatory Visit: Payer: Medicaid Other | Admitting: Family

## 2021-10-14 ENCOUNTER — Encounter (INDEPENDENT_AMBULATORY_CARE_PROVIDER_SITE_OTHER): Payer: Medicaid Other

## 2021-10-14 VITALS — BP 187/110 | HR 78 | Resp 18 | Ht 67.0 in | Wt 224.1 lb

## 2021-10-14 DIAGNOSIS — Z87891 Personal history of nicotine dependence: Secondary | ICD-10-CM | POA: Insufficient documentation

## 2021-10-14 DIAGNOSIS — F329 Major depressive disorder, single episode, unspecified: Secondary | ICD-10-CM

## 2021-10-14 DIAGNOSIS — F32A Depression, unspecified: Secondary | ICD-10-CM | POA: Diagnosis not present

## 2021-10-14 DIAGNOSIS — M109 Gout, unspecified: Secondary | ICD-10-CM | POA: Insufficient documentation

## 2021-10-14 DIAGNOSIS — Z8673 Personal history of transient ischemic attack (TIA), and cerebral infarction without residual deficits: Secondary | ICD-10-CM | POA: Insufficient documentation

## 2021-10-14 DIAGNOSIS — Z86711 Personal history of pulmonary embolism: Secondary | ICD-10-CM | POA: Diagnosis not present

## 2021-10-14 DIAGNOSIS — I890 Lymphedema, not elsewhere classified: Secondary | ICD-10-CM | POA: Diagnosis not present

## 2021-10-14 DIAGNOSIS — I110 Hypertensive heart disease with heart failure: Secondary | ICD-10-CM | POA: Insufficient documentation

## 2021-10-14 DIAGNOSIS — F419 Anxiety disorder, unspecified: Secondary | ICD-10-CM | POA: Diagnosis not present

## 2021-10-14 DIAGNOSIS — I10 Essential (primary) hypertension: Secondary | ICD-10-CM | POA: Diagnosis not present

## 2021-10-14 DIAGNOSIS — I4891 Unspecified atrial fibrillation: Secondary | ICD-10-CM | POA: Insufficient documentation

## 2021-10-14 DIAGNOSIS — G4733 Obstructive sleep apnea (adult) (pediatric): Secondary | ICD-10-CM

## 2021-10-14 DIAGNOSIS — Z79899 Other long term (current) drug therapy: Secondary | ICD-10-CM | POA: Insufficient documentation

## 2021-10-14 DIAGNOSIS — I5032 Chronic diastolic (congestive) heart failure: Secondary | ICD-10-CM

## 2021-10-14 DIAGNOSIS — J449 Chronic obstructive pulmonary disease, unspecified: Secondary | ICD-10-CM | POA: Diagnosis not present

## 2021-10-14 DIAGNOSIS — Z760 Encounter for issue of repeat prescription: Secondary | ICD-10-CM | POA: Diagnosis not present

## 2021-10-14 MED ORDER — ATORVASTATIN CALCIUM 40 MG PO TABS: 40.0000 mg | ORAL_TABLET | Freq: Every day | ORAL | 5 refills | Status: DC

## 2021-10-14 MED ORDER — AMLODIPINE BESYLATE 10 MG PO TABS: 10.0000 mg | ORAL_TABLET | Freq: Every day | ORAL | 5 refills | Status: DC

## 2021-10-14 MED ORDER — POTASSIUM CHLORIDE 20 MEQ PO PACK: 20.0000 meq | PACK | Freq: Every day | ORAL | 5 refills | Status: DC

## 2021-10-14 MED ORDER — CLONIDINE HCL 0.1 MG PO TABS: 0.1000 mg | ORAL_TABLET | Freq: Every day | ORAL | 5 refills | Status: DC

## 2021-10-14 MED ORDER — HYDRALAZINE HCL 50 MG PO TABS: 50.0000 mg | ORAL_TABLET | Freq: Three times a day (TID) | ORAL | 5 refills | Status: DC

## 2021-10-14 MED ORDER — FUROSEMIDE 20 MG PO TABS: 20.0000 mg | ORAL_TABLET | Freq: Every day | ORAL | 5 refills | Status: DC

## 2021-10-14 MED ORDER — LISINOPRIL 40 MG PO TABS: 40.0000 mg | ORAL_TABLET | Freq: Every day | ORAL | 5 refills | Status: DC

## 2021-10-14 NOTE — Patient Instructions (Addendum)
Continue weighing daily and call for an overnight weight gain of 3 pounds or more or a weekly weight gain of more than 5 pounds.   If you have voicemail, please make sure your mailbox is cleaned out so that we may leave a message and please make sure to listen to any voicemails.    Take your hydralazine three times a day.    I refilled your blood pressure and heart medications, everything will need to come from primary care.

## 2021-10-14 NOTE — Progress Notes (Signed)
HPI   [name] is a 56 y/o female with a history of pulmonary embolus, obstructive sleep apnea, atrial fibrillation, COPD, gout, HTN, CVA, remote tobacco use and chronic heart failure.   Echo report from 03/26/2018 reviewed and showed an EF of 60-65% along with mild/ moderate TR. Echo report from 06/17/16 reviewed and showed an EF of 55-65%. Had a pharmacologic stress trest done 09/12/12 and had an estimated EF of 49%.   Catheterization done 03/27/2018 showed normal coronary arteries and normal left ventricular function.  Was in the ED 09/01/21 due to shortness of breath. Oxygen tank at home had broke. Placed on 2L in the ED. IV lasix given. Lasix doubled and she was released. Was in the ED 08/12/21 due to chest pain and shortness of breath due to acute on chronic heart failure. Oxygen machine hasn't been working for prior 2 weeks and she had not been taking furosemide. IV lasix given and her symptoms improved. Given short course of oral lasix and she was released.   She presents today for a follow-up visit with a chief complaint of minimal shortness of breath upon moderate exertion. Describes this as chronic in nature. She has associated fatigue, cough, wheezing and anxiety along with this. She denies any difficulty sleeping, abdominal distention, palpitations, pedal edema, chest pain or dizziness.   Gives differing accounts of how she's taking her medications. She tells me that she hasn't taken any of her medications at all this week (today is Wed) and that last week was "hit or miss". Says that she doesn't feel bad and didn't realize her BP was this high.   Did say that she had only been taking her hydralazine once daily.   She asks that med refills be sent to [facility] as they will package her meds and mail them to her.   She does mention that her anxiety/ depression are worse but she doesn't know why. Occasionally sees/ hears things and it scares her.   Past Medical History:  Diagnosis Date   A-fib  [facility]) 2010   Adrenal adenoma, left 10/06/2004   a.) CT 10/06/2004 --> measured 3.1 x 1.6 cm   Alcohol abuse    Allergy    Anginal pain (HCC)    Anxiety    Cardiac murmur    CHF (congestive heart failure) (HCC)    COPD (chronic obstructive pulmonary disease) (HCC)    Diverticulosis    Fatty liver    GERD (gastroesophageal reflux disease)    Gout    History of cocaine abuse ([place])    a.) reports that use discontinued following CVA in 2010.   History of kidney stones    Hypertension    Mild cognitive impairment    Non-healing non-surgical wound    right leg   Obesity    On supplemental oxygen therapy    a.) 2 L/[place]   OSA on CPAP    Pulmonary embolus ([place]) 02/19/2012   a.) CTA --> RIGHT upper lobe.   Stroke [facility]) 2010   Thoracic ascending aortic aneurysm ([place])    a.) CT 10/28/2019 --> measured 4.6 cm.   Thyroid disease    Tricuspid valvular regurgitation    a.) TTE 03/26/2018 --> mild to moderate.   Past Surgical History:  Procedure Laterality Date   CYSTOSCOPY WITH STENT PLACEMENT Left 10/02/2020   Procedure: CYSTOSCOPY WITH STENT PLACEMENT;  Surgeon: Billey Co, MD;  Location: [facility] ORS;  Service: Urology;  Laterality: Left;   CYSTOSCOPY/URETEROSCOPY/HOLMIUM LASER/STENT PLACEMENT Left 10/24/2020  Procedure: CYSTOSCOPY/URETEROSCOPY/HOLMIUM LASER/STENT EXCHANGE;  Surgeon: Billey Co, MD;  Location: [facility] ORS;  Service: Urology;  Laterality: Left;   LEFT HEART CATH AND CORONARY ANGIOGRAPHY N/A 03/27/2018   Procedure: LEFT HEART CATH AND CORONARY ANGIOGRAPHY;  Surgeon: Isaias Cowman, MD;  Location: [place] CV LAB;  Service: Cardiovascular;  Laterality: N/A;   ORIF ANKLE FRACTURE BIMALLEOLAR Left 09/20/2001   ORIF WRIST FRACTURE Bilateral 2010   Family History  Problem Relation Age of Onset   Prostate cancer Father    Rectal cancer Sister    Breast cancer Sister    Cancer Maternal Grandmother    Cancer Maternal Grandfather    Social History    Tobacco Use   Smoking status: Former    Types: Cigarettes    Quit date: 01/26/2016    Years since quitting: 5.7   Smokeless tobacco: Never  Substance Use Topics   Alcohol use: Yes    Comment: Occassionally   Allergies  Allergen Reactions   Morphine And Related Hives   Penicillins Other (See Comments)    Painful urination Has patient had a PCN reaction causing immediate rash, facial/tongue/throat swelling, SOB or lightheadedness with hypotension: yes Has patient had a PCN reaction causing severe rash involving mucus membranes or skin necrosis: no Has patient had a PCN reaction that required hospitalization no Has patient had a PCN reaction occurring within the last 10 years: no If all of the above answers are "NO", then may proceed with Cephalosporin use.    Prior to Admission medications   Medication Sig Start Date End Date Taking? Authorizing Provider  allopurinol (ZYLOPRIM) 300 MG tablet TAKE 1 TABLET BY MOUTH EVERY DAY 08/26/21  Yes Betty Billings, NP  aspirin EC 81 MG tablet Take 1 tablet (81 mg total) by mouth daily. 10/08/20  Yes Betty Billings, NP  Cholecalciferol (VITAMIN D3) 25 MCG (1000 UT) CAPS Take 1 capsule (1,000 Units total) by mouth daily. 08/03/21  Yes Betty Billings, NP  citalopram (CELEXA) 10 MG tablet Take 1 tablet (10 mg total) by mouth daily. 07/31/21  Yes Betty Billings, NP  gabapentin (NEURONTIN) 300 MG capsule 300 mg qAM, 600 mg qhs Patient taking differently: Take 600 mg by mouth at bedtime. 600 mg qhs 10/08/20  Yes Betty Billings, NP  pantoprazole (PROTONIX) 40 MG tablet Take 1 tablet (40 mg total) by mouth daily. 10/08/20  Yes Betty Billings, NP  PROAIR HFA 108 [phone] Base) MCG/ACT inhaler INHALE TWO (2) PUFFS BY MOUTH EVERY 6 HOURS AS NEEDED FOR WHEEZING OR FOR SHORTNESS OF BREATH 10/08/20  Yes Betty Billings, NP  tiotropium (SPIRIVA HANDIHALER) 18 MCG inhalation capsule Place 1 capsule (18 mcg total) into inhaler and inhale daily. 07/31/21  Yes  Betty Billings, NP  amLODipine (NORVASC) 10 MG tablet Take 1 tablet (10 mg total) by mouth daily. 10/14/21   Betty Graff, FNP  atorvastatin (LIPITOR) 40 MG tablet Take 1 tablet (40 mg total) by mouth daily. 10/14/21   Betty Graff, FNP  cloNIDine (CATAPRES) 0.1 MG tablet Take 1 tablet (0.1 mg total) by mouth daily. 10/14/21   Betty Graff, FNP  colchicine 0.6 MG tablet TAKE ONE (1) TABLET BY MOUTH TWICE DAILY AS NEEDED FOR GOUT FLARES Patient not taking: Reported on 10/14/2021 07/13/21   Betty Billings, NP  furosemide (LASIX) 20 MG tablet Take 1 tablet (20 mg total) by mouth daily. 10/14/21   Betty Graff, FNP  hydrALAZINE (APRESOLINE) 50 MG tablet Take 1 tablet (50 mg total) by mouth  3 (three) times daily. 10/14/21   Betty Graff, FNP  lisinopril (ZESTRIL) 40 MG tablet Take 1 tablet (40 mg total) by mouth daily. 10/14/21   Betty Graff, FNP  potassium chloride (KLOR-CON) 20 MEQ packet Take 20 mEq by mouth daily. 10/14/21   Betty Graff, FNP    Review of Systems  Constitutional:  Positive for fatigue. Negative for appetite change.  HENT:  Negative for congestion, postnasal drip and sore throat.   Eyes: Negative.   Respiratory:  Positive for cough, shortness of breath and wheezing.   Cardiovascular:  Negative for chest pain, palpitations and leg swelling.  Gastrointestinal:  Negative for abdominal distention and abdominal pain.  Endocrine: Negative.   Genitourinary: Negative.   Musculoskeletal:  Negative for back pain and neck pain.  Skin: Negative.   Allergic/Immunologic: Negative.   Neurological:  Negative for dizziness and light-headedness.  Hematological:  Negative for adenopathy. Does not bruise/bleed easily.  Psychiatric/Behavioral:  Negative for dysphoric mood and sleep disturbance (sleeping on 2 pillows). The patient is nervous/anxious.    Vitals:   10/14/21 1058  BP: (!) 187/110  Pulse: 78  Resp: 18  SpO2: 98%  Weight: 224 lb 2 oz (101.7 kg)  Height: 5'  7" (1.702 m)   Wt Readings from Last 3 Encounters:  10/14/21 224 lb 2 oz (101.7 kg)  09/10/21 223 lb (101.2 kg)  08/14/21 218 lb 2 oz (98.9 kg)   Lab Results  Component Value Date   CREATININE 0.88 09/01/2021   CREATININE 0.88 08/14/2021   CREATININE 0.78 08/12/2021   Physical Exam Vitals and nursing note reviewed.  Constitutional:      Appearance: She is well-developed.  HENT:     Head: Normocephalic and atraumatic.  Neck:     Vascular: No JVD.  Cardiovascular:     Rate and Rhythm: Regular rhythm.  Pulmonary:     Effort: Pulmonary effort is normal. No respiratory distress.     Breath sounds: No wheezing or rales.  Abdominal:     General: There is no distension.     Palpations: Abdomen is soft.     Tenderness: There is no abdominal tenderness.  Musculoskeletal:        General: No tenderness.     Cervical back: Normal range of motion and neck supple.     Right lower leg: No tenderness. No edema.     Left lower leg: No tenderness. No edema.  Skin:    General: Skin is warm and dry.  Neurological:     Mental Status: She is alert and oriented to person, place, and time.  Psychiatric:        Mood and Affect: Mood is anxious and depressed.        Behavior: Behavior normal.        Thought Content: Thought content normal.   Assessment & Plan:  1: Chronic heart failure with preserved ejection fraction- - NYHA class II - euvolemic today - weighing daily; reminded to call for an overnight weight gain of >2 pounds or a weekly weight gain of >5 pounds.  - weight up 6 pounds from last visit here 2 months ago - not adding salt to her food. Reminded to closely follow a '2000mg'$  sodium diet  - saw cardiology [name]) 03/18/20 - will need to r/s echo once she's back on her medications for a few months - refilled her HTN/ HF/ cardiac meds today - emphasized that she take her hydralazine TID - BNP 09/01/21 was  135.3  2: HTN- - BP elevated but she hasn't taken any of her medications  for this week, at least, and was sporadic with them last week - saw PCP [name]) 07/31/21 - reviewed BMP done 09/01/21 and it showed sodium 144, potassium 3.3, creatinine 0.88 and GFR >60 - will recheck BMP next visit since I refilled her medications  3: Obstructive sleep apnea- - wearing CPAP nightly - still doesn't have oxygen  4: Lymphedema- - resolved - saw vascular [name]) 09/10/21  5: Depression/ anxiety- - reports worsening anxiety/ depression; says that sometimes she feels like she's hearing things or seeing things - emphasized that she call her PCP to see if she can be seen sooner - messaged PCP myself regarding this as well   Patient did not bring her medications nor a list. Each medication was verbally reviewed with the patient and she was encouraged to bring the bottles to every visit to confirm accuracy of list.  Return in 2 weeks, sooner if needed

## 2021-10-14 NOTE — Progress Notes (Signed)
Patient ID: [name], female   DOB: 22-Sep-1965, 56 y.o.   MRN: [id number]  [place] - PHARMACIST COUNSELING NOTE  HFpEF  ACE/ARB/ARNI: Lisinopril 40 mg daily Beta Blocker:  none Aldosterone Antagonist:  none Diuretic: Furosemide 20 mg daily SGLT2i:  none  Adherence Assessment  Do you ever forget to take your medication? '[x]'$ Yes '[]'$ No  Do you ever skip doses due to side effects? '[]'$ Yes '[x]'$ No  Do you have trouble affording your medicines? '[]'$ Yes '[x]'$ No  Are you ever unable to pick up your medication due to transportation difficulties? '[]'$ Yes '[x]'$ No  Do you ever stop taking your medications because you don't believe they are helping? '[]'$ Yes '[x]'$ No  Do you check your weight daily? '[]'$ Yes '[x]'$ No   Adherence strategy: none  Barriers to obtaining medications: none  Vital signs: HR 78, BP 187/110, weight (pounds) 224 lbs ECHO: Date 03/26/2018, EF 60-65%, notes: mild/ moderate TR     Latest Ref Rng & Units 09/01/2021   10:16 AM 08/14/2021   11:34 AM 08/12/2021   10:51 AM  BMP  Glucose 70 - 99 mg/dL 114  96  119   BUN 6 - 20 mg/dL '17  13  13   '$ Creatinine 0.44 - 1.00 mg/dL 0.88  0.88  0.78   Sodium 135 - 145 mmol/L 144  140  141   Potassium 3.5 - 5.1 mmol/L 3.3  3.2  3.0   Chloride 98 - 111 mmol/L 108  105  106   CO2 22 - 32 mmol/L '26  27  24   '$ Calcium 8.9 - 10.3 mg/dL 9.0  9.2  8.9     Past Medical History:  Diagnosis Date   A-fib [facility]) 2010   Adrenal adenoma, left 10/06/2004   a.) CT 10/06/2004 --> measured 3.1 x 1.6 cm   Alcohol abuse    Allergy    Anginal pain (HCC)    Anxiety    Cardiac murmur    CHF (congestive heart failure) (HCC)    COPD (chronic obstructive pulmonary disease) (HCC)    Diverticulosis    Fatty liver    GERD (gastroesophageal reflux disease)    Gout    History of cocaine abuse ([place])    a.) reports that use discontinued following CVA in 2010.   History of kidney stones    Hypertension    Mild cognitive  impairment    Non-healing non-surgical wound    right leg   Obesity    On supplemental oxygen therapy    a.) 2 L/[place]   OSA on CPAP    Pulmonary embolus ([place]) 02/19/2012   a.) CTA --> RIGHT upper lobe.   Stroke [facility]) 2010   Thoracic ascending aortic aneurysm ([place])    a.) CT 10/28/2019 --> measured 4.6 cm.   Thyroid disease    Tricuspid valvular regurgitation    a.) TTE 03/26/2018 --> mild to moderate.    ASSESSMENT 56 year old female who presents to the HF clinic for follow up. PMH includes pulmonary embolus, obstructive sleep apnea, atrial fibrillation, COPD, gout, HTN, CVA, remote tobacco use and chronic heart failure. Last ED visit was 6 weeks ago for shortness of breath. Patient has issues with compliance as well. Last time took her medication was > 3 days ago.  PLAN (recommendation)  Refill all cardiac medication with [facility] for prepack services to improve compliance.  Will consider adding GLP1 to therapy once patient is back on her regular med schedule. Repeat BMP  due to hx of hypokalemia  Time spent: 15 minutes  Bohdan Macho Rodriguez-Guzman PharmD, BCPS 10/14/2021 1:15 PM    Current Outpatient Medications:    allopurinol (ZYLOPRIM) 300 MG tablet, TAKE 1 TABLET BY MOUTH EVERY DAY, Disp: 30 tablet, Rfl: 0   amLODipine (NORVASC) 10 MG tablet, Take 1 tablet (10 mg total) by mouth daily., Disp: 30 tablet, Rfl: 5   aspirin EC 81 MG tablet, Take 1 tablet (81 mg total) by mouth daily., Disp: 30 tablet, Rfl: 1   atorvastatin (LIPITOR) 40 MG tablet, Take 1 tablet (40 mg total) by mouth daily., Disp: 30 tablet, Rfl: 5   Cholecalciferol (VITAMIN D3) 25 MCG (1000 UT) CAPS, Take 1 capsule (1,000 Units total) by mouth daily., Disp: 60 capsule, Rfl: 1   citalopram (CELEXA) 10 MG tablet, Take 1 tablet (10 mg total) by mouth daily., Disp: 90 tablet, Rfl: 1   cloNIDine (CATAPRES) 0.1 MG tablet, Take 1 tablet (0.1 mg total) by mouth daily., Disp: 30 tablet, Rfl: 5   colchicine 0.6 MG tablet,  TAKE ONE (1) TABLET BY MOUTH TWICE DAILY AS NEEDED FOR GOUT FLARES (Patient not taking: Reported on 10/14/2021), Disp: 90 tablet, Rfl: 0   furosemide (LASIX) 20 MG tablet, Take 1 tablet (20 mg total) by mouth daily., Disp: 30 tablet, Rfl: 5   gabapentin (NEURONTIN) 300 MG capsule, 300 mg qAM, 600 mg qhs (Patient taking differently: Take 600 mg by mouth at bedtime. 600 mg qhs), Disp: 90 capsule, Rfl: 5   hydrALAZINE (APRESOLINE) 50 MG tablet, Take 1 tablet (50 mg total) by mouth 3 (three) times daily., Disp: 90 tablet, Rfl: 5   lisinopril (ZESTRIL) 40 MG tablet, Take 1 tablet (40 mg total) by mouth daily., Disp: 30 tablet, Rfl: 5   pantoprazole (PROTONIX) 40 MG tablet, Take 1 tablet (40 mg total) by mouth daily., Disp: 30 tablet, Rfl: 6   potassium chloride (KLOR-CON) 20 MEQ packet, Take 20 mEq by mouth daily., Disp: 30 packet, Rfl: 5   PROAIR HFA 108 (90 Base) MCG/ACT inhaler, INHALE TWO (2) PUFFS BY MOUTH EVERY 6 HOURS AS NEEDED FOR WHEEZING OR FOR SHORTNESS OF BREATH, Disp: 8.5 g, Rfl: 6   tiotropium (SPIRIVA HANDIHALER) 18 MCG inhalation capsule, Place 1 capsule (18 mcg total) into inhaler and inhale daily., Disp: 30 capsule, Rfl: 1    MEDICATION ADHERENCES TIPS AND STRATEGIES Taking medication as prescribed improves patient outcomes in heart failure (reduces hospitalizations, improves symptoms, increases survival) Side effects of medications can be managed by decreasing doses, switching agents, stopping drugs, or adding additional therapy. Please let someone in the [facility] know if you have having bothersome side effects so we can modify your regimen. Do not alter your medication regimen without talking to [place].  Medication reminders can help patients remember to take drugs on time. If you are missing or forgetting doses you can try linking behaviors, using pill boxes, or an electronic reminder like an alarm on your phone or an app. Some people can also get automated phone calls as  medication reminders.

## 2021-10-15 NOTE — Progress Notes (Deleted)
   There were no vitals taken for this visit.   Subjective:    Patient ID: [name], female    DOB: 1965-12-26, 56 y.o.   MRN: [id number]  HPI: [name] is a 56 y.o. female  No chief complaint on file.   Relevant past medical, surgical, family and social history reviewed and updated as indicated. Interim medical history since our last visit reviewed. Allergies and medications reviewed and updated.  Review of Systems  Per HPI unless specifically indicated above     Objective:    There were no vitals taken for this visit.  Wt Readings from Last 3 Encounters:  10/14/21 224 lb 2 oz (101.7 kg)  09/10/21 223 lb (101.2 kg)  08/14/21 218 lb 2 oz (98.9 kg)    Physical Exam  Results for orders placed or performed during the hospital encounter of 51/76/16  Basic metabolic panel  Result Value Ref Range   Sodium 144 135 - 145 mmol/L   Potassium 3.3 (L) 3.5 - 5.1 mmol/L   Chloride 108 98 - 111 mmol/L   CO2 26 22 - 32 mmol/L   Glucose, Bld 114 (H) 70 - 99 mg/dL   BUN 17 6 - 20 mg/dL   Creatinine, Ser 0.88 0.44 - 1.00 mg/dL   Calcium 9.0 8.9 - 10.3 mg/dL   GFR, Estimated >60 >60 mL/min   Anion gap 10 5 - 15  CBC  Result Value Ref Range   WBC 7.7 4.0 - 10.5 K/uL   RBC 4.83 3.87 - 5.11 MIL/uL   Hemoglobin 14.4 12.0 - 15.0 g/dL   HCT 45.6 36.0 - 46.0 %   MCV 94.4 80.0 - 100.0 fL   MCH 29.8 26.0 - 34.0 pg   MCHC 31.6 30.0 - 36.0 g/dL   RDW 15.5 11.5 - 15.5 %   Platelets 234 150 - 400 K/uL   nRBC 0.0 0.0 - 0.2 %  Protime-INR (order if Patient is taking Coumadin / Warfarin)  Result Value Ref Range   Prothrombin Time 12.2 11.4 - 15.2 seconds   INR 0.9 0.8 - 1.2  Brain natriuretic peptide  Result Value Ref Range   B Natriuretic Peptide 135.3 (H) 0.0 - 100.0 pg/mL  Troponin I (High Sensitivity)  Result Value Ref Range   Troponin I (High Sensitivity) 11 <18 ng/L      Assessment & Plan:   Problem List Items Addressed This Visit   None    Follow up plan: No  follow-ups on file.

## 2021-10-15 NOTE — Progress Notes (Signed)
BP (!) 162/117   Pulse 62   Temp 97.6 F (36.4 C) (Oral)   Wt 229 lb 1.6 oz (103.9 kg)   SpO2 97%   BMI 35.88 kg/m    Subjective:    Patient ID: [name], female    DOB: 17-Jun-1965, 56 y.o.   MRN: [id number]  HPI: [name] is a 56 y.o. female presenting on 10/16/2021 for comprehensive medical examination. Current medical complaints include: Blood pressure and nebulizer machine  She currently lives with: Menopausal Symptoms: no  HYPERTENSION with Chronic Kidney Disease Hypertension status: controlled  Satisfied with current treatment? yes Duration of hypertension: years BP monitoring frequency:  not checking BP range:  BP medication side effects:  no Medication compliance: excellent compliance Previous BP meds: Hydralazine, amlodipine, and lisinopril Aspirin: no Recurrent headaches: no Visual changes: no Palpitations: no Dyspnea: yes Chest pain: no Lower extremity edema: no Dizzy/lightheaded: no  Depression Screen done today and results listed below:     10/16/2021    8:34 AM 08/14/2021   10:29 AM 07/31/2021   10:56 AM 04/20/2021   10:33 AM 04/15/2021    3:02 PM  Depression screen PHQ 2/9  Decreased Interest '1 2 2 2 2  '$ Down, Depressed, Hopeless '2 2 1 2 2  '$ PHQ - 2 Score '3 4 3 4 4  '$ Altered sleeping '2 2 2 2 2  '$ Tired, decreased energy '2 2 2 2 2  '$ Change in appetite '2 3 1 2 1  '$ Feeling bad or failure about yourself  '1  1 1 '$ 0  Trouble concentrating '1 2 1 2 1  '$ Moving slowly or fidgety/restless 0 '1 1 1 1  '$ Suicidal thoughts 0 0 0 0 0  PHQ-9 Score '11 14 11 14 11  '$ Difficult doing work/chores Not difficult at all Very difficult Somewhat difficult Somewhat difficult Not difficult at all    The patient does not have a history of falls. I did complete a risk assessment for falls. A plan of care for falls was documented.   Past Medical History:  Past Medical History:  Diagnosis Date   A-fib [facility]) 2010   Adrenal adenoma, left 10/06/2004   a.) CT 10/06/2004 -->  measured 3.1 x 1.6 cm   Alcohol abuse    Allergy    Anginal pain (HCC)    Anxiety    Cardiac murmur    CHF (congestive heart failure) (HCC)    COPD (chronic obstructive pulmonary disease) (HCC)    Diverticulosis    Fatty liver    GERD (gastroesophageal reflux disease)    Gout    History of cocaine abuse ([place])    a.) reports that use discontinued following CVA in 2010.   History of kidney stones    Hypertension    Mild cognitive impairment    Non-healing non-surgical wound    right leg   Obesity    On supplemental oxygen therapy    a.) 2 L/[place]   OSA on CPAP    Pulmonary embolus ([place]) 02/19/2012   a.) CTA --> RIGHT upper lobe.   Stroke [facility]) 2010   Thoracic ascending aortic aneurysm ([place])    a.) CT 10/28/2019 --> measured 4.6 cm.   Thyroid disease    Tricuspid valvular regurgitation    a.) TTE 03/26/2018 --> mild to moderate.    Surgical History:  Past Surgical History:  Procedure Laterality Date   CYSTOSCOPY WITH STENT PLACEMENT Left 10/02/2020   Procedure: CYSTOSCOPY WITH STENT PLACEMENT;  Surgeon: Diamantina Providence,  Herbert Seta, MD;  Location: [facility] ORS;  Service: Urology;  Laterality: Left;   CYSTOSCOPY/URETEROSCOPY/HOLMIUM LASER/STENT PLACEMENT Left 10/24/2020   Procedure: CYSTOSCOPY/URETEROSCOPY/HOLMIUM LASER/STENT EXCHANGE;  Surgeon: Billey Co, MD;  Location: [facility] ORS;  Service: Urology;  Laterality: Left;   LEFT HEART CATH AND CORONARY ANGIOGRAPHY N/A 03/27/2018   Procedure: LEFT HEART CATH AND CORONARY ANGIOGRAPHY;  Surgeon: Isaias Cowman, MD;  Location: [facility];  Service: Cardiovascular;  Laterality: N/A;   ORIF ANKLE FRACTURE BIMALLEOLAR Left 09/20/2001   ORIF WRIST FRACTURE Bilateral 2010    Medications:  Current Outpatient Medications on File Prior to Visit  Medication Sig   amLODipine (NORVASC) 10 MG tablet Take 1 tablet (10 mg total) by mouth daily.   aspirin EC 81 MG tablet Take 1 tablet (81 mg total) by mouth daily.   atorvastatin (LIPITOR)  40 MG tablet Take 1 tablet (40 mg total) by mouth daily.   cloNIDine (CATAPRES) 0.1 MG tablet Take 1 tablet (0.1 mg total) by mouth daily.   colchicine 0.6 MG tablet TAKE ONE (1) TABLET BY MOUTH TWICE DAILY AS NEEDED FOR GOUT FLARES   furosemide (LASIX) 20 MG tablet Take 1 tablet (20 mg total) by mouth daily.   lisinopril (ZESTRIL) 40 MG tablet Take 1 tablet (40 mg total) by mouth daily.   potassium chloride (KLOR-CON) 20 MEQ packet Take 20 mEq by mouth daily.   PROAIR HFA 108 (90 Base) MCG/ACT inhaler INHALE TWO (2) PUFFS BY MOUTH EVERY 6 HOURS AS NEEDED FOR WHEEZING OR FOR SHORTNESS OF BREATH   tiotropium (SPIRIVA HANDIHALER) 18 MCG inhalation capsule Place 1 capsule (18 mcg total) into inhaler and inhale daily.   No current facility-administered medications on file prior to visit.    Allergies:  Allergies  Allergen Reactions   Morphine And Related Hives   Penicillins Other (See Comments)    Painful urination Has patient had a PCN reaction causing immediate rash, facial/tongue/throat swelling, SOB or lightheadedness with hypotension: yes Has patient had a PCN reaction causing severe rash involving mucus membranes or skin necrosis: no Has patient had a PCN reaction that required hospitalization no Has patient had a PCN reaction occurring within the last 10 years: no If all of the above answers are "NO", then may proceed with Cephalosporin use.     Social History:  Social History   Socioeconomic History   Marital status: Legally Separated    Spouse name: Not on file   Number of children: Not on file   Years of education: Not on file   Highest education level: Not on file  Occupational History   Not on file  Tobacco Use   Smoking status: Former    Types: Cigarettes    Quit date: 01/26/2016    Years since quitting: 5.7   Smokeless tobacco: Never  Vaping Use   Vaping Use: Never used  Substance and Sexual Activity   Alcohol use: Yes    Comment: Occassionally   Drug use: No    Sexual activity: Never  Other Topics Concern   Not on file  Social History Narrative   Not on file   Social Determinants of Health   Financial Resource Strain: Not on file  Food Insecurity: Not on file  Transportation Needs: Not on file  Physical Activity: Not on file  Stress: Not on file  Social Connections: Not on file  Intimate Partner Violence: Not on file   Social History   Tobacco Use  Smoking Status Former  Types: Cigarettes   Quit date: 01/26/2016   Years since quitting: 5.7  Smokeless Tobacco Never   Social History   Substance and Sexual Activity  Alcohol Use Yes   Comment: Occassionally    Family History:  Family History  Problem Relation Age of Onset   Prostate cancer Father    Rectal cancer Sister    Breast cancer Sister    Cancer Maternal Grandmother    Cancer Maternal Grandfather     Past medical history, surgical history, medications, allergies, family history and social history reviewed with patient today and changes made to appropriate areas of the chart.   Review of Systems  Eyes:  Negative for blurred vision and double vision.  Respiratory:  Positive for shortness of breath.   Cardiovascular:  Negative for chest pain, palpitations and leg swelling.  Neurological:  Negative for dizziness and headaches.   All other ROS negative except what is listed above and in the HPI.      Objective:    BP (!) 162/117   Pulse 62   Temp 97.6 F (36.4 C) (Oral)   Wt 229 lb 1.6 oz (103.9 kg)   SpO2 97%   BMI 35.88 kg/m   Wt Readings from Last 3 Encounters:  10/16/21 229 lb 1.6 oz (103.9 kg)  10/14/21 224 lb 2 oz (101.7 kg)  09/10/21 223 lb (101.2 kg)    Physical Exam Vitals and nursing note reviewed.  Constitutional:      General: She is awake. She is not in acute distress.    Appearance: Normal appearance. She is well-developed. She is obese. She is not ill-appearing.  HENT:     Head: Normocephalic and atraumatic.     Right Ear: Hearing,  tympanic membrane, ear canal and external ear normal. No drainage.     Left Ear: Hearing, tympanic membrane, ear canal and external ear normal. No drainage.     Nose: Nose normal.     Right Sinus: No maxillary sinus tenderness or frontal sinus tenderness.     Left Sinus: No maxillary sinus tenderness or frontal sinus tenderness.     Mouth/Throat:     Mouth: Mucous membranes are moist.     Pharynx: Oropharynx is clear. Uvula midline. No pharyngeal swelling, oropharyngeal exudate or posterior oropharyngeal erythema.  Eyes:     General: Lids are normal.        Right eye: No discharge.        Left eye: No discharge.     Extraocular Movements: Extraocular movements intact.     Conjunctiva/sclera: Conjunctivae normal.     Pupils: Pupils are equal, round, and reactive to light.     Visual Fields: Right eye visual fields normal and left eye visual fields normal.  Neck:     Thyroid: No thyromegaly.     Vascular: No carotid bruit.     Trachea: Trachea normal.  Cardiovascular:     Rate and Rhythm: Normal rate and regular rhythm.     Heart sounds: Normal heart sounds. No murmur heard.    No gallop.  Pulmonary:     Effort: Pulmonary effort is normal. No accessory muscle usage or respiratory distress.     Breath sounds: Normal breath sounds.  Chest:  Breasts:    Right: Normal.     Left: Normal.  Abdominal:     General: Bowel sounds are normal.     Palpations: Abdomen is soft. There is no hepatomegaly or splenomegaly.     Tenderness: There is  no abdominal tenderness.  Musculoskeletal:        General: Normal range of motion.     Cervical back: Normal range of motion and neck supple.     Right lower leg: No edema.     Left lower leg: No edema.  Lymphadenopathy:     Head:     Right side of head: No submental, submandibular, tonsillar, preauricular or posterior auricular adenopathy.     Left side of head: No submental, submandibular, tonsillar, preauricular or posterior auricular adenopathy.      Cervical: No cervical adenopathy.     Upper Body:     Right upper body: No supraclavicular, axillary or pectoral adenopathy.     Left upper body: No supraclavicular, axillary or pectoral adenopathy.  Skin:    General: Skin is warm and dry.     Capillary Refill: Capillary refill takes less than 2 seconds.     Findings: No rash.  Neurological:     Mental Status: She is alert and oriented to person, place, and time.     Gait: Gait is intact.     Deep Tendon Reflexes: Reflexes are normal and symmetric.     Reflex Scores:      Brachioradialis reflexes are 2+ on the right side and 2+ on the left side.      Patellar reflexes are 2+ on the right side and 2+ on the left side. Psychiatric:        Attention and Perception: Attention normal.        Mood and Affect: Mood normal.        Speech: Speech normal.        Behavior: Behavior normal. Behavior is cooperative.        Thought Content: Thought content normal.        Judgment: Judgment normal.     Results for orders placed or performed during the hospital encounter of 24/23/53  Basic metabolic panel  Result Value Ref Range   Sodium 144 135 - 145 mmol/L   Potassium 3.3 (L) 3.5 - 5.1 mmol/L   Chloride 108 98 - 111 mmol/L   CO2 26 22 - 32 mmol/L   Glucose, Bld 114 (H) 70 - 99 mg/dL   BUN 17 6 - 20 mg/dL   Creatinine, Ser 0.88 0.44 - 1.00 mg/dL   Calcium 9.0 8.9 - 10.3 mg/dL   GFR, Estimated >60 >60 mL/min   Anion gap 10 5 - 15  CBC  Result Value Ref Range   WBC 7.7 4.0 - 10.5 K/uL   RBC 4.83 3.87 - 5.11 MIL/uL   Hemoglobin 14.4 12.0 - 15.0 g/dL   HCT 45.6 36.0 - 46.0 %   MCV 94.4 80.0 - 100.0 fL   MCH 29.8 26.0 - 34.0 pg   MCHC 31.6 30.0 - 36.0 g/dL   RDW 15.5 11.5 - 15.5 %   Platelets 234 150 - 400 K/uL   nRBC 0.0 0.0 - 0.2 %  Protime-INR (order if Patient is taking Coumadin / Warfarin)  Result Value Ref Range   Prothrombin Time 12.2 11.4 - 15.2 seconds   INR 0.9 0.8 - 1.2  Brain natriuretic peptide  Result Value Ref  Range   B Natriuretic Peptide 135.3 (H) 0.0 - 100.0 pg/mL  Troponin I (High Sensitivity)  Result Value Ref Range   Troponin I (High Sensitivity) 11 <18 ng/L      Assessment & Plan:   Problem List Items Addressed This Visit  Cardiovascular and Mediastinum   Essential hypertension (Chronic)    Chronic. Not well controlled. Will increase Hydralazine to '100mg'$  TID.  Discussed with patient the importance of taking medication daily to help get blood pressure better controlled.  Keep follow up with HF clinic.  Follow up in 1 month.  Call sooner if concerns arise.       Relevant Medications   hydrALAZINE (APRESOLINE) 100 MG tablet     Respiratory   COPD (chronic obstructive pulmonary disease) (HCC)    Chronic. Does Endorse SOB.  Has not been to Pulmonology for evaluation for oxygen. Would like nebulizer machine to use PRN.  Discussed how to properly use medication.  Will order for patient.        Other Visit Diagnoses     Annual physical exam    -  Primary   Health maintenance reviewed during visit today. Labs ordered. Mammogram ordered. Will get PAP at future visit. Will order colonoscopy when BP is improved.   Relevant Orders   CBC with Differential/Platelet   Comprehensive metabolic panel   Lipid panel   TSH   Urinalysis, Routine w reflex microscopic   Screening for ischemic heart disease       Relevant Orders   Lipid panel   Encounter for hepatitis C screening test for low risk patient       Relevant Orders   Hepatitis C Antibody   Encounter for screening mammogram for malignant neoplasm of breast       Relevant Orders   MM 3D SCREEN BREAST BILATERAL        Follow up plan: No follow-ups on file.   LABORATORY TESTING:  - Pap smear:  Will get in the future  IMMUNIZATIONS:   - Tdap: Tetanus vaccination status reviewed: last tetanus booster within 10 years. - Influenza: Refused - Pneumovax: up to date - Prevnar: Not applicable - COVID: Not applicable - HPV:  Not applicable - Shingrix vaccine: Refused  SCREENING: -Mammogram: Ordered today  - Colonoscopy:  will wait till blood pressure is better controlled   - Bone Density: Not applicable  -Hearing Test: Not applicable  -Spirometry: Not applicable   PATIENT COUNSELING:   Advised to take 1 mg of folate supplement per day if capable of pregnancy.   Sexuality: Discussed sexually transmitted diseases, partner selection, use of condoms, avoidance of unintended pregnancy  and contraceptive alternatives.   Advised to avoid cigarette smoking.  I discussed with the patient that most people either abstain from alcohol or drink within safe limits (<=14/week and <=4 drinks/occasion for males, <=7/weeks and <= 3 drinks/occasion for females) and that the risk for alcohol disorders and other health effects rises proportionally with the number of drinks per week and how often a drinker exceeds daily limits.  Discussed cessation/primary prevention of drug use and availability of treatment for abuse.   Diet: Encouraged to adjust caloric intake to maintain  or achieve ideal body weight, to reduce intake of dietary saturated fat and total fat, to limit sodium intake by avoiding high sodium foods and not adding table salt, and to maintain adequate dietary potassium and calcium preferably from fresh fruits, vegetables, and low-fat dairy products.    stressed the importance of regular exercise  Injury prevention: Discussed safety belts, safety helmets, smoke detector, smoking near bedding or upholstery.   Dental health: Discussed importance of regular tooth brushing, flossing, and dental visits.    NEXT PREVENTATIVE PHYSICAL DUE IN 1 YEAR. No follow-ups on file.

## 2021-10-16 ENCOUNTER — Ambulatory Visit (INDEPENDENT_AMBULATORY_CARE_PROVIDER_SITE_OTHER): Payer: Medicaid Other | Admitting: Nurse Practitioner

## 2021-10-16 ENCOUNTER — Encounter: Payer: Self-pay | Admitting: Nurse Practitioner

## 2021-10-16 VITALS — BP 162/117 | HR 62 | Temp 97.6°F | Wt 229.1 lb

## 2021-10-16 DIAGNOSIS — Z1231 Encounter for screening mammogram for malignant neoplasm of breast: Secondary | ICD-10-CM | POA: Diagnosis not present

## 2021-10-16 DIAGNOSIS — Z0000 Encounter for general adult medical examination without abnormal findings: Secondary | ICD-10-CM

## 2021-10-16 DIAGNOSIS — Z1159 Encounter for screening for other viral diseases: Secondary | ICD-10-CM

## 2021-10-16 DIAGNOSIS — Z136 Encounter for screening for cardiovascular disorders: Secondary | ICD-10-CM

## 2021-10-16 DIAGNOSIS — J449 Chronic obstructive pulmonary disease, unspecified: Secondary | ICD-10-CM

## 2021-10-16 DIAGNOSIS — I10 Essential (primary) hypertension: Secondary | ICD-10-CM

## 2021-10-16 LAB — URINALYSIS, ROUTINE W REFLEX MICROSCOPIC
Bilirubin, UA: NEGATIVE
Glucose, UA: NEGATIVE
Ketones, UA: NEGATIVE
Leukocytes,UA: NEGATIVE
Nitrite, UA: NEGATIVE
Protein,UA: NEGATIVE
RBC, UA: NEGATIVE
Specific Gravity, UA: 1.02 (ref 1.005–1.030)
Urobilinogen, Ur: 0.2 mg/dL (ref 0.2–1.0)
pH, UA: 7 (ref 5.0–7.5)

## 2021-10-16 MED ORDER — VITAMIN D3 25 MCG (1000 UT) PO CAPS: 1000.0000 [IU] | ORAL_CAPSULE | Freq: Every day | ORAL | 1 refills | Status: AC

## 2021-10-16 MED ORDER — PANTOPRAZOLE SODIUM 40 MG PO TBEC: 40.0000 mg | DELAYED_RELEASE_TABLET | Freq: Every day | ORAL | 1 refills | Status: DC

## 2021-10-16 MED ORDER — GABAPENTIN 300 MG PO CAPS
600.0000 mg | ORAL_CAPSULE | Freq: Every day | ORAL | Status: DC
Start: 2021-10-16 — End: 2021-11-03

## 2021-10-16 MED ORDER — CITALOPRAM HYDROBROMIDE 10 MG PO TABS: 10.0000 mg | ORAL_TABLET | Freq: Every day | ORAL | 1 refills | Status: DC

## 2021-10-16 MED ORDER — HYDRALAZINE HCL 100 MG PO TABS: 100.0000 mg | ORAL_TABLET | Freq: Three times a day (TID) | ORAL | 0 refills | Status: DC

## 2021-10-16 MED ORDER — ALLOPURINOL 300 MG PO TABS: 300.0000 mg | ORAL_TABLET | Freq: Every day | ORAL | 1 refills | Status: AC

## 2021-10-16 NOTE — Assessment & Plan Note (Signed)
Chronic. Not well controlled. Will increase Hydralazine to '100mg'$  TID.  Discussed with patient the importance of taking medication daily to help get blood pressure better controlled.  Keep follow up with HF clinic.  Follow up in 1 month.  Call sooner if concerns arise.

## 2021-10-16 NOTE — Assessment & Plan Note (Signed)
Chronic. Does Endorse SOB.  Has not been to Pulmonology for evaluation for oxygen. Would like nebulizer machine to use PRN.  Discussed how to properly use medication.  Will order for patient.

## 2021-10-17 ENCOUNTER — Emergency Department
Admission: EM | Admit: 2021-10-17 | Discharge: 2021-10-17 | Disposition: A | Discharge status: Home / Self Care | Payer: Medicaid Other | Attending: Emergency Medicine | Admitting: Emergency Medicine

## 2021-10-17 ENCOUNTER — Emergency Department: Payer: Medicaid Other

## 2021-10-17 DIAGNOSIS — I5032 Chronic diastolic (congestive) heart failure: Secondary | ICD-10-CM | POA: Insufficient documentation

## 2021-10-17 DIAGNOSIS — Z8673 Personal history of transient ischemic attack (TIA), and cerebral infarction without residual deficits: Secondary | ICD-10-CM | POA: Insufficient documentation

## 2021-10-17 DIAGNOSIS — R0602 Shortness of breath: Secondary | ICD-10-CM | POA: Insufficient documentation

## 2021-10-17 DIAGNOSIS — I110 Hypertensive heart disease with heart failure: Secondary | ICD-10-CM | POA: Insufficient documentation

## 2021-10-17 DIAGNOSIS — J449 Chronic obstructive pulmonary disease, unspecified: Secondary | ICD-10-CM | POA: Insufficient documentation

## 2021-10-17 LAB — CBC WITH DIFFERENTIAL/PLATELET
Abs Immature Granulocytes: 0.03 10*3/uL (ref 0.00–0.07)
Basophils Absolute: 0 10*3/uL (ref 0.0–0.2)
Basophils Absolute: 0 10*3/uL (ref 0.0–0.1)
Basophils Relative: 1 %
Basos: 0 %
EOS (ABSOLUTE): 0.1 10*3/uL (ref 0.0–0.4)
Eos: 1 %
Eosinophils Absolute: 0.1 10*3/uL (ref 0.0–0.5)
Eosinophils Relative: 1 %
HCT: 44.7 % (ref 36.0–46.0)
Hematocrit: 42.6 % (ref 34.0–46.6)
Hemoglobin: 13.6 g/dL (ref 11.1–15.9)
Hemoglobin: 14.3 g/dL (ref 12.0–15.0)
Immature Grans (Abs): 0 10*3/uL (ref 0.0–0.1)
Immature Granulocytes: 0 %
Immature Granulocytes: 0 %
Lymphocytes Absolute: 1.9 10*3/uL (ref 0.7–3.1)
Lymphocytes Relative: 21 %
Lymphs Abs: 1.8 10*3/uL (ref 0.7–4.0)
Lymphs: 22 %
MCH: 30.4 pg (ref 26.6–33.0)
MCH: 30.8 pg (ref 26.0–34.0)
MCHC: 31.9 g/dL (ref 31.5–35.7)
MCHC: 32 g/dL (ref 30.0–36.0)
MCV: 95 fL (ref 79–97)
MCV: 96.3 fL (ref 80.0–100.0)
Monocytes Absolute: 0.6 10*3/uL (ref 0.1–0.9)
Monocytes Absolute: 0.7 10*3/uL (ref 0.1–1.0)
Monocytes Relative: 9 %
Monocytes: 7 %
Neutro Abs: 5.8 10*3/uL (ref 1.7–7.7)
Neutrophils Absolute: 6.2 10*3/uL (ref 1.4–7.0)
Neutrophils Relative %: 68 %
Neutrophils: 70 %
Platelets: 226 10*3/uL (ref 150–450)
Platelets: 228 10*3/uL (ref 150–400)
RBC: 4.48 x10E6/uL (ref 3.77–5.28)
RBC: 4.64 MIL/uL (ref 3.87–5.11)
RDW: 14 % (ref 11.7–15.4)
RDW: 15 % (ref 11.5–15.5)
WBC: 8.9 10*3/uL (ref 3.4–10.8)
WBC: 8.5 10*3/uL (ref 4.0–10.5)
nRBC: 0 % (ref 0.0–0.2)

## 2021-10-17 LAB — COMPREHENSIVE METABOLIC PANEL
ALT: 10 IU/L (ref 0–32)
ALT: 12 U/L (ref 0–44)
AST: 11 IU/L (ref 0–40)
AST: 14 U/L — ABNORMAL LOW (ref 15–41)
Albumin/Globulin Ratio: 1.5 (ref 1.2–2.2)
Albumin: 3.8 g/dL (ref 3.8–4.9)
Albumin: 3.7 g/dL (ref 3.5–5.0)
Alkaline Phosphatase: 79 IU/L (ref 44–121)
Alkaline Phosphatase: 73 U/L (ref 38–126)
Anion gap: 11 (ref 5–15)
BUN/Creatinine Ratio: 26 — ABNORMAL HIGH (ref 9–23)
BUN: 19 mg/dL (ref 6–24)
BUN: 19 mg/dL (ref 6–20)
Bilirubin Total: 0.9 mg/dL (ref 0.0–1.2)
CO2: 24 mmol/L (ref 20–29)
CO2: 25 mmol/L (ref 22–32)
Calcium: 9.1 mg/dL (ref 8.7–10.2)
Calcium: 9.3 mg/dL (ref 8.9–10.3)
Chloride: 104 mmol/L (ref 96–106)
Chloride: 106 mmol/L (ref 98–111)
Creatinine, Ser: 0.72 mg/dL (ref 0.57–1.00)
Creatinine, Ser: 0.61 mg/dL (ref 0.44–1.00)
GFR, Estimated: >60 mL/min (ref >60)
Globulin, Total: 2.6 g/dL (ref 1.5–4.5)
Glucose, Bld: 95 mg/dL (ref 70–99)
Glucose: 97 mg/dL (ref 70–99)
Potassium: 3.8 mmol/L (ref 3.5–5.2)
Potassium: 3.4 mmol/L — ABNORMAL LOW (ref 3.5–5.1)
Sodium: 145 mmol/L — ABNORMAL HIGH (ref 134–144)
Sodium: 142 mmol/L (ref 135–145)
Total Bilirubin: 1.3 mg/dL — ABNORMAL HIGH (ref 0.3–1.2)
Total Protein: 6.4 g/dL (ref 6.0–8.5)
Total Protein: 7.2 g/dL (ref 6.5–8.1)
eGFR: 99 mL/min/{1.73_m2} (ref >59)

## 2021-10-17 LAB — LIPID PANEL
Chol/HDL Ratio: 2.9 ratio (ref 0.0–4.4)
Cholesterol, Total: 196 mg/dL (ref 100–199)
HDL: 68 mg/dL (ref >39)
LDL Chol Calc (NIH): 112 mg/dL — ABNORMAL HIGH (ref 0–99)
Triglycerides: 89 mg/dL (ref 0–149)
VLDL Cholesterol Cal: 16 mg/dL (ref 5–40)

## 2021-10-17 LAB — TSH: TSH: 1.37 u[IU]/mL (ref 0.450–4.500)

## 2021-10-17 LAB — TROPONIN I (HIGH SENSITIVITY): Troponin I (High Sensitivity): 9 ng/L (ref <18)

## 2021-10-17 LAB — BRAIN NATRIURETIC PEPTIDE: B Natriuretic Peptide: 98.8 pg/mL (ref 0.0–100.0)

## 2021-10-17 LAB — HEPATITIS C ANTIBODY: Hep C Virus Ab: NONREACTIVE

## 2021-10-17 MED ORDER — IPRATROPIUM-ALBUTEROL 0.5-2.5 (3) MG/3ML IN SOLN: 3.0000 mL | Freq: Once | RESPIRATORY_TRACT | Status: DC

## 2021-10-17 MED ORDER — ALBUTEROL SULFATE HFA 108 (90 BASE) MCG/ACT IN AERS
1.0000 | INHALATION_SPRAY | Freq: Once | RESPIRATORY_TRACT | Status: AC
  Administered 2021-10-17: 2 via RESPIRATORY_TRACT
  Filled 2021-10-17: qty 6.7

## 2021-10-17 NOTE — ED Provider Notes (Signed)
[facility] Provider Note    Event Date/Time   First MD Initiated Contact with Patient 10/17/21 1318     (approximate)   History   Shortness of Breath   HPI  [name] is a 56 y.o. female past medical history of atrial fibrillation, CHF, COPD, hypertension who presents with shortness of breath.  Patient tells me she has been feeling short of breath for several months.  She denies cough fevers or chills.  Does have some intermittent burning in her chest which is also been going on for several months.  Denies any increased weight gain.  Today she was lying in bed when she suddenly felt diaphoretic and felt short of breath.  She does not currently have her nebulizers.  She sees Darylene Price who prescribed her medications yesterday but she has not yet received them.  She does take her Lasix.  Tells me that she has been on oxygen in the past on 2 L but has not had her O2 tank in about a year.  Darylene Price has also ordered this for her.  Denies any nausea vomiting abdominal pain or other GI symptoms.  Patient saw Darylene Price 3 days ago.  At that time she was felt to be euvolemic.  She also saw primary doctor yesterday who has referred her to pulmonology for evaluation for whether she needs oxygen and has prescribed her nebulizer.     Past Medical History:  Diagnosis Date   A-fib [facility]) 2010   Adrenal adenoma, left 10/06/2004   a.) CT 10/06/2004 --> measured 3.1 x 1.6 cm   Alcohol abuse    Allergy    Anginal pain (HCC)    Anxiety    Cardiac murmur    CHF (congestive heart failure) (HCC)    COPD (chronic obstructive pulmonary disease) (HCC)    Diverticulosis    Fatty liver    GERD (gastroesophageal reflux disease)    Gout    History of cocaine abuse ([place])    a.) reports that use discontinued following CVA in 2010.   History of kidney stones    Hypertension    Mild cognitive impairment    Non-healing non-surgical wound    right leg   Obesity    On  supplemental oxygen therapy    a.) 2 L/   OSA on CPAP    Pulmonary embolus ([place]) 02/19/2012   a.) CTA --> RIGHT upper lobe.   Stroke [facility]) 2010   Thoracic ascending aortic aneurysm ([place])    a.) CT 10/28/2019 --> measured 4.6 cm.   Thyroid disease    Tricuspid valvular regurgitation    a.) TTE 03/26/2018 --> mild to moderate.    Patient Active Problem List   Diagnosis Date Noted   Chronic pain of right knee 04/15/2021   Anxiety 03/03/2021   OSA on CPAP 04/21/2020   Thoracic ascending aortic aneurysm ([place]) 03/03/2020   Hearing loss due to cerumen impaction, left 07/23/2019   Lymphedema 12/06/2018   IFG (impaired fasting glucose) 11/03/2018   Hallucinations, visual 07/03/2018   Unstable angina (HCC) 03/25/2018   Gout 05/09/2017   COPD (chronic obstructive pulmonary disease) ([place]) 04/10/2017   History of pulmonary embolism 04/10/2017   History of thyroid disease 04/10/2017   Hyperlipidemia 12/31/2016   Essential hypertension 05/26/2016   Chronic diastolic heart failure ([place]) 05/26/2016   Adrenal adenoma 09/28/2012   Esophageal reflux 12/23/2008     Physical Exam  Triage Vital Signs: ED Triage Vitals  Enc Vitals Group     BP --      Pulse --      Resp --      Temp --      Temp src --      SpO2 10/17/21 1321 96 %     Weight 10/17/21 1322 224 lb (101.6 kg)     Height 10/17/21 1322 '5\' 10"'$  (1.778 m)     Head Circumference --      Peak Flow --      Pain Score 10/17/21 1322 6     Pain Loc --      Pain Edu? --      Excl. in [place]? --     Most recent vital signs: Vitals:   10/17/21 1400 10/17/21 1413  BP: (!) 168/114   Pulse: (!) 105 85  Resp: (!) 34 20  Temp:    SpO2: 97% 100%     General: Awake, no distress.  CV:  Good peripheral perfusion.  No peripheral edema Resp:  Normal effort.  Abd:  No distention. Abdomen is soft, nontender throughout Neuro:             Awake, Alert, Oriented x 3  Other:     ED Results / Procedures / Treatments  Labs (all labs  ordered are listed, but only abnormal results are displayed) Labs Reviewed  COMPREHENSIVE METABOLIC PANEL - Abnormal; Notable for the following components:      Result Value   Potassium 3.4 (*)    AST 14 (*)    Total Bilirubin 1.3 (*)    All other components within normal limits  BRAIN NATRIURETIC PEPTIDE  CBC WITH DIFFERENTIAL/PLATELET  TROPONIN I (HIGH SENSITIVITY)     EKG  EKG reviewed and interpreted by myself shows sinus rhythm with PACs LVH to inversion 1 and aVL left axis deviation   RADIOLOGY CXR reviewed and interpreted by myself shows cardiomegaly no obvious pulmonary edema   PROCEDURES:  Critical Care performed: No  Procedures  The patient is on the cardiac monitor to evaluate for evidence of arrhythmia and/or significant heart rate changes.   MEDICATIONS ORDERED IN ED: Medications  albuterol (VENTOLIN HFA) 108 (90 Base) MCG/ACT inhaler 1-2 puff (has no administration in time range)     IMPRESSION / MDM / ASSESSMENT AND PLAN / ED COURSE  I reviewed the triage vital signs and the nursing notes.                              Patient's presentation is most consistent with acute presentation with potential threat to life or bodily function.  Differential diagnosis includes, but is not limited to, CHF exacerbation, COPD exacerbation, pneumonia, ACS  Patient is a 56 year old female with both history of COPD and CHF who presents with several months of shortness of breath.  She presents specifically today because she had an episode of diaphoresis where she felt short of breath.  She has intermittent burning sensation in her chest is nonpleuritic nonexertional been going on for several months.  She follows closely with Darylene Price seen just 3 days ago felt to be euvolemic.  Patient has history of being noncompliant with her medications.  She is satting 98% on room air is mildly hypertensive she is not in any distress no increased work of breathing does not appear  overtly volume overloaded.  We will check labs including CBC BMP BNP troponin chest x-ray.  Labs are reassuring troponin is negative.  Patient is not having any active chest pain there is no new EKG changes and her intermittent pain has been going on for several months do not feel that she requires further evaluation for this at this time.  BNP is within normal range.  Patient's chest x-ray does not show pulmonary edema or obvious infiltrate.  She is placed on 2 L nasal cannula for comfort however is not hypoxic here.  Patient does not have an albuterol inhaler at home so I have ordered her test to have in the ED so that she can then have the albuterol inhaler in hand.  Do not feel that she requires admission at this time given she is not hypoxic and not significantly symptomatic.  Encouraged her to follow-up with her PCP and [facility] as she has been.  Encouraged medication compliance.  Does not require oxygen urgently for home now.   FINAL CLINICAL IMPRESSION(S) / ED DIAGNOSES   Final diagnoses:  SOB (shortness of breath)     Rx / DC Orders   ED Discharge Orders     None        Note:  This document was prepared using Dragon voice recognition software and may include unintentional dictation errors.   Rada Hay, MD 10/17/21 [phone]

## 2021-10-17 NOTE — ED Notes (Signed)
Pt transported back to room from Xray via stretcher.

## 2021-10-17 NOTE — ED Notes (Signed)
Pt transported to Xray via stretcher

## 2021-10-17 NOTE — ED Notes (Signed)
Pt requesting to be put on 2L [place]. Per pt, she wears 2L at baseline and "can't breathe" without it. MD made aware.

## 2021-10-17 NOTE — Discharge Instructions (Addendum)
Your chest x-ray EKG and blood work were all reassuring.  Please follow-up with both [name] and your primary doctor.  You can use the albuterol inhaler as needed for shortness of breath.

## 2021-10-17 NOTE — ED Triage Notes (Signed)
BIB [facility] from home C/O SOB, abdominal pain, nausea. Pt reports intermittent SOB X 1 year. Abdominal pain began 30 minutes ago. Pt reports chronic O2 2L, but states her O2 concentrator has been "out" for a year. SaO2 98% RA on arrival.

## 2021-10-19 NOTE — Progress Notes (Signed)
Please let patient know that her lab work looks good.  No concerns at this time.  Continue with current medication regimen.  I would like her to be consistent with taking her medications so we can get the best benefit from them.  Follow up as discussed.

## 2021-10-25 NOTE — Progress Notes (Deleted)
HPI   [name] is a 56 y/o female with a history of pulmonary embolus, obstructive sleep apnea, atrial fibrillation, COPD, gout, HTN, CVA, remote tobacco use and chronic heart failure.   Echo report from 03/26/2018 reviewed and showed an EF of 60-65% along with mild/ moderate TR. Echo report from 06/17/16 reviewed and showed an EF of 55-65%. Had a pharmacologic stress trest done 09/12/12 and had an estimated EF of 49%.   Catheterization done 03/27/2018 showed normal coronary arteries and normal left ventricular function.  Was in the ED 10/17/21 due to SOB. Not in HF/ COPD exacerbation. Inhaler provided and she was released. Was in the ED 09/01/21 due to shortness of breath. Oxygen tank at home had broke. Placed on 2L in the ED. IV lasix given. Lasix doubled and she was released. Was in the ED 08/12/21 due to chest pain and shortness of breath due to acute on chronic heart failure. Oxygen machine hasn't been working for prior 2 weeks and she had not been taking furosemide. IV lasix given and her symptoms improved. Given short course of oral lasix and she was released.   She presents today for a follow-up visit with a chief complaint of   Past Medical History:  Diagnosis Date   A-fib [facility]) 2010   Adrenal adenoma, left 10/06/2004   a.) CT 10/06/2004 --> measured 3.1 x 1.6 cm   Alcohol abuse    Allergy    Anginal pain (HCC)    Anxiety    Cardiac murmur    CHF (congestive heart failure) (HCC)    COPD (chronic obstructive pulmonary disease) ([place])    Diverticulosis    Fatty liver    GERD (gastroesophageal reflux disease)    Gout    History of cocaine abuse ([place])    a.) reports that use discontinued following CVA in 2010.   History of kidney stones    Hypertension    Mild cognitive impairment    Non-healing non-surgical wound    right leg   Obesity    On supplemental oxygen therapy    a.) 2 L/[place]   OSA on CPAP    Pulmonary embolus ([place]) 02/19/2012   a.) CTA --> RIGHT upper lobe.   Stroke [facility])  2010   Thoracic ascending aortic aneurysm ([place])    a.) CT 10/28/2019 --> measured 4.6 cm.   Thyroid disease    Tricuspid valvular regurgitation    a.) TTE 03/26/2018 --> mild to moderate.   Past Surgical History:  Procedure Laterality Date   CYSTOSCOPY WITH STENT PLACEMENT Left 10/02/2020   Procedure: CYSTOSCOPY WITH STENT PLACEMENT;  Surgeon: Billey Co, MD;  Location: [facility] ORS;  Service: Urology;  Laterality: Left;   CYSTOSCOPY/URETEROSCOPY/HOLMIUM LASER/STENT PLACEMENT Left 10/24/2020   Procedure: CYSTOSCOPY/URETEROSCOPY/HOLMIUM LASER/STENT EXCHANGE;  Surgeon: Billey Co, MD;  Location: [facility] ORS;  Service: Urology;  Laterality: Left;   LEFT HEART CATH AND CORONARY ANGIOGRAPHY N/A 03/27/2018   Procedure: LEFT HEART CATH AND CORONARY ANGIOGRAPHY;  Surgeon: Isaias Cowman, MD;  Location: [place] [facility];  Service: Cardiovascular;  Laterality: N/A;   ORIF ANKLE FRACTURE BIMALLEOLAR Left 09/20/2001   ORIF WRIST FRACTURE Bilateral 2010   Family History  Problem Relation Age of Onset   Prostate cancer Father    Rectal cancer Sister    Breast cancer Sister    Cancer Maternal Grandmother    Cancer Maternal Grandfather    Social History   Tobacco Use   Smoking status: Former    Types:  Cigarettes    Quit date: 01/26/2016    Years since quitting: 5.7   Smokeless tobacco: Never  Substance Use Topics   Alcohol use: Yes    Comment: Occassionally   Allergies  Allergen Reactions   Morphine And Related Hives   Penicillins Other (See Comments)    Painful urination Has patient had a PCN reaction causing immediate rash, facial/tongue/throat swelling, SOB or lightheadedness with hypotension: yes Has patient had a PCN reaction causing severe rash involving mucus membranes or skin necrosis: no Has patient had a PCN reaction that required hospitalization no Has patient had a PCN reaction occurring within the last 10 years: no If all of the above answers are "NO", then may  proceed with Cephalosporin use.      Review of Systems  Constitutional:  Positive for fatigue. Negative for appetite change.  HENT:  Negative for congestion, postnasal drip and sore throat.   Eyes: Negative.   Respiratory:  Positive for cough, shortness of breath and wheezing.   Cardiovascular:  Negative for chest pain, palpitations and leg swelling.  Gastrointestinal:  Negative for abdominal distention and abdominal pain.  Endocrine: Negative.   Genitourinary: Negative.   Musculoskeletal:  Negative for back pain and neck pain.  Skin: Negative.   Allergic/Immunologic: Negative.   Neurological:  Negative for dizziness and light-headedness.  Hematological:  Negative for adenopathy. Does not bruise/bleed easily.  Psychiatric/Behavioral:  Negative for dysphoric mood and sleep disturbance (sleeping on 2 pillows). The patient is nervous/anxious.      Physical Exam Vitals and nursing note reviewed.  Constitutional:      Appearance: She is well-developed.  HENT:     Head: Normocephalic and atraumatic.  Neck:     Vascular: No JVD.  Cardiovascular:     Rate and Rhythm: Regular rhythm.  Pulmonary:     Effort: Pulmonary effort is normal. No respiratory distress.     Breath sounds: No wheezing or rales.  Abdominal:     General: There is no distension.     Palpations: Abdomen is soft.     Tenderness: There is no abdominal tenderness.  Musculoskeletal:        General: No tenderness.     Cervical back: Normal range of motion and neck supple.     Right lower leg: No tenderness. No edema.     Left lower leg: No tenderness. No edema.  Skin:    General: Skin is warm and dry.  Neurological:     Mental Status: She is alert and oriented to person, place, and time.  Psychiatric:        Mood and Affect: Mood is anxious and depressed.        Behavior: Behavior normal.        Thought Content: Thought content normal.   Assessment & Plan:  1: Chronic heart failure with preserved ejection  fraction- - NYHA class II - euvolemic today - weighing daily; reminded to call for an overnight weight gain of >2 pounds or a weekly weight gain of >5 pounds.  - weight 224.2 pounds from last visit here 10 days ago - not adding salt to her food. Reminded to closely follow a '2000mg'$  sodium diet  - saw cardiology Margarito Courser) 03/18/20 - will need to r/s echo once she's back on her medications for a few months - BNP 09/01/21 was 135.3  2: HTN- - BP  - saw PCP Mathis Dad) 10/16/21 - reviewed BMP done 10/17/21 and it showed sodium 142, potassium 3.4, creatinine 0.61  and GFR >60 - will recheck BMP today  3: Obstructive sleep apnea- - wearing CPAP nightly - still doesn't have oxygen  4: Lymphedema- - resolved - saw vascular Owens Shark) 09/10/21  5: Depression/ anxiety- - reports worsening anxiety/ depression; says that sometimes she feels like she's hearing things or seeing things - saw PCP Mathis Dad) 10/16/21   Patient did not bring her medications nor a list. Each medication was verbally reviewed with the patient and she was encouraged to bring the bottles to every visit to confirm accuracy of list.

## 2021-10-26 ENCOUNTER — Telehealth: Payer: Self-pay | Admitting: Family

## 2021-10-26 ENCOUNTER — Ambulatory Visit: Payer: Medicaid Other | Admitting: Family

## 2021-10-26 NOTE — Telephone Encounter (Signed)
Patient did not show for her [facility] appointment on 10/26/21. Will attempt to reschedule.

## 2021-11-02 ENCOUNTER — Telehealth: Payer: Self-pay

## 2021-11-02 ENCOUNTER — Telehealth: Payer: Self-pay | Admitting: Nurse Practitioner

## 2021-11-02 ENCOUNTER — Ambulatory Visit: Payer: Medicaid Other | Admitting: Nurse Practitioner

## 2021-11-02 NOTE — Telephone Encounter (Signed)
Pt called reporting that she had a family emergency today and that she came in person to report this with the front desk staff. She is asking to receive the four medications below to [facility] [place]. Please advise.

## 2021-11-02 NOTE — Telephone Encounter (Signed)
Patient has appt today, will make aware of her mammogram on Tuesday 12/01/2021 at 1:40 PM when she comes in for her appt.

## 2021-11-02 NOTE — Progress Notes (Deleted)
There were no vitals taken for this visit.   Subjective:    Patient ID: [name], female    DOB: 1965-11-14, 56 y.o.   MRN: [id number]  HPI: [name] is a 56 y.o. female  No chief complaint on file.  HYPERTENSION {Blank single:19197::"without","with"} Chronic Kidney Disease Hypertension status: {Blank single:19197::"controlled","uncontrolled","better","worse","exacerbated","stable"}  Satisfied with current treatment? {Blank single:19197::"yes","no"} Duration of hypertension: {Blank single:19197::"chronic","months","years"} BP monitoring frequency:  {Blank single:19197::"not checking","rarely","daily","weekly","monthly","a few times a day","a few times a week","a few times a month"} BP range:  BP medication side effects:  {Blank single:19197::"yes","no"} Medication compliance: {Blank single:19197::"excellent compliance","good compliance","fair compliance","poor compliance"} Previous BP meds:{Blank [id number]:39767::"HALP","FXTKWIOXBD","ZHGDJMEQAS/TMHDQQIWLN","LGXQJJHE","RDEYCXKGYJ","EHUDJSHFWY/OVZC","HYIFOYDXAJ (bystolic)","carvedilol","chlorthalidone","clonidine","diltiazem","exforge HCT","HCTZ","irbesartan (avapro)","labetalol","lisinopril","lisinopril-HCTZ","losartan (cozaar)","methyldopa","nifedipine","olmesartan (benicar)","olmesartan-HCTZ","quinapril","ramipril","spironalactone","tekturna","valsartan","valsartan-HCTZ","verapamil"} Aspirin: {Blank single:19197::"yes","no"} Recurrent headaches: {Blank single:19197::"yes","no"} Visual changes: {Blank single:19197::"yes","no"} Palpitations: {Blank single:19197::"yes","no"} Dyspnea: {Blank single:19197::"yes","no"} Chest pain: {Blank single:19197::"yes","no"} Lower extremity edema: {Blank single:19197::"yes","no"} Dizzy/lightheaded: {Blank single:19197::"yes","no"}  Relevant past medical, surgical, family and social history reviewed and updated as indicated. Interim medical history since our last visit reviewed. Allergies  and medications reviewed and updated.  Review of Systems  Per HPI unless specifically indicated above     Objective:    There were no vitals taken for this visit.  Wt Readings from Last 3 Encounters:  10/17/21 224 lb (101.6 kg)  10/16/21 229 lb 1.6 oz (103.9 kg)  10/14/21 224 lb 2 oz (101.7 kg)    Physical Exam  Results for orders placed or performed during the hospital encounter of 10/17/21  Comprehensive metabolic panel  Result Value Ref Range   Sodium 142 135 - 145 mmol/L   Potassium 3.4 (L) 3.5 - 5.1 mmol/L   Chloride 106 98 - 111 mmol/L   CO2 25 22 - 32 mmol/L   Glucose, Bld 95 70 - 99 mg/dL   BUN 19 6 - 20 mg/dL   Creatinine, Ser 0.61 0.44 - 1.00 mg/dL   Calcium 9.3 8.9 - 10.3 mg/dL   Total Protein 7.2 6.5 - 8.1 g/dL   Albumin 3.7 3.5 - 5.0 g/dL   AST 14 (L) 15 - 41 U/L   ALT 12 0 - 44 U/L   Alkaline Phosphatase 73 38 - 126 U/L   Total Bilirubin 1.3 (H) 0.3 - 1.2 mg/dL   GFR, Estimated >60 >60 mL/min   Anion gap 11 5 - 15  Brain natriuretic peptide  Result Value Ref Range   B Natriuretic Peptide 98.8 0.0 - 100.0 pg/mL  CBC with Differential  Result Value Ref Range   WBC 8.5 4.0 - 10.5 K/uL   RBC 4.64 3.87 - 5.11 MIL/uL   Hemoglobin 14.3 12.0 - 15.0 g/dL   HCT 44.7 36.0 - 46.0 %   MCV 96.3 80.0 - 100.0 fL   MCH 30.8 26.0 - 34.0 pg   MCHC 32.0 30.0 - 36.0 g/dL   RDW 15.0 11.5 - 15.5 %   Platelets 228 150 - 400 K/uL   nRBC 0.0 0.0 - 0.2 %   Neutrophils Relative % 68 %   Neutro Abs 5.8 1.7 - 7.7 K/uL   Lymphocytes Relative 21 %   Lymphs Abs 1.8 0.7 - 4.0 K/uL   Monocytes Relative 9 %   Monocytes Absolute 0.7 0.1 - 1.0 K/uL   Eosinophils Relative 1 %   Eosinophils Absolute 0.1 0.0 - 0.5 K/uL   Basophils Relative 1 %   Basophils Absolute 0.0 0.0 - 0.1 K/uL   Immature Granulocytes 0 %   Abs Immature Granulocytes 0.03 0.00 - 0.07 K/uL  Troponin I (High Sensitivity)  Result  Value Ref Range   Troponin I (High Sensitivity) 9 <18 ng/L      Assessment &  Plan:   Problem List Items Addressed This Visit   None    Follow up plan: No follow-ups on file.

## 2021-11-02 NOTE — Telephone Encounter (Signed)
Patient has an appt this afternoon.  We can address it at that time.

## 2021-11-02 NOTE — Telephone Encounter (Signed)
PT came in and said that she had an appointment with provider on 10/16/2021.  She went to pharmacy to pick up medications and they were not filled.  Was wondering if she could get them sent to the pharmacy.  The medications she needs is Gabapentin, Hydralazine, Pantoprazole, and Spiriva Handihaler.  Put list in provider's folder.

## 2021-11-03 MED ORDER — GABAPENTIN 300 MG PO CAPS: 600.0000 mg | ORAL_CAPSULE | Freq: Every day | ORAL | 0 refills | Status: DC

## 2021-11-03 MED ORDER — PANTOPRAZOLE SODIUM 40 MG PO TBEC: 40.0000 mg | DELAYED_RELEASE_TABLET | Freq: Every day | ORAL | 0 refills | Status: DC

## 2021-11-03 MED ORDER — HYDRALAZINE HCL 100 MG PO TABS: 100.0000 mg | ORAL_TABLET | Freq: Three times a day (TID) | ORAL | 0 refills | Status: DC

## 2021-11-03 MED ORDER — TIOTROPIUM BROMIDE MONOHYDRATE 18 MCG IN CAPS: 18.0000 ug | ORAL_CAPSULE | Freq: Every day | RESPIRATORY_TRACT | 1 refills | Status: DC

## 2021-11-03 NOTE — Telephone Encounter (Signed)
Patient has been made aware of medications being sent to pharmacy as well as her mammogram appt at [facility].

## 2021-11-03 NOTE — Addendum Note (Signed)
Addended by: Jon Billings on: 11/03/2021 07:52 AM   Modules accepted: Orders

## 2021-11-03 NOTE — Telephone Encounter (Signed)
Refills have been sent to the pharmacy 

## 2021-11-05 DIAGNOSIS — Z1152 Encounter for screening for COVID-19: Secondary | ICD-10-CM | POA: Insufficient documentation

## 2021-11-05 DIAGNOSIS — Z7982 Long term (current) use of aspirin: Secondary | ICD-10-CM | POA: Insufficient documentation

## 2021-11-05 DIAGNOSIS — R197 Diarrhea, unspecified: Secondary | ICD-10-CM | POA: Insufficient documentation

## 2021-11-05 DIAGNOSIS — I110 Hypertensive heart disease with heart failure: Secondary | ICD-10-CM | POA: Insufficient documentation

## 2021-11-05 DIAGNOSIS — R0602 Shortness of breath: Secondary | ICD-10-CM | POA: Insufficient documentation

## 2021-11-05 DIAGNOSIS — R0789 Other chest pain: Secondary | ICD-10-CM | POA: Diagnosis not present

## 2021-11-05 DIAGNOSIS — R112 Nausea with vomiting, unspecified: Secondary | ICD-10-CM | POA: Insufficient documentation

## 2021-11-05 DIAGNOSIS — R519 Headache, unspecified: Secondary | ICD-10-CM | POA: Diagnosis not present

## 2021-11-05 DIAGNOSIS — I509 Heart failure, unspecified: Secondary | ICD-10-CM | POA: Insufficient documentation

## 2021-11-05 DIAGNOSIS — J449 Chronic obstructive pulmonary disease, unspecified: Secondary | ICD-10-CM | POA: Diagnosis not present

## 2021-11-05 DIAGNOSIS — Z79899 Other long term (current) drug therapy: Secondary | ICD-10-CM | POA: Insufficient documentation

## 2021-11-05 NOTE — ED Triage Notes (Signed)
To triage via [facility] with c/o shortness of breath, chest pain and vomiting x 1 hour. Pt started new medication Tuesday. Pt normally wears 02@ 2L . Self administered 1 albuterol tx at home prior to EMS arrival.

## 2021-11-06 ENCOUNTER — Emergency Department
Admission: EM | Admit: 2021-11-06 | Discharge: 2021-11-06 | Disposition: A | Discharge status: Home / Self Care | Payer: Medicaid Other | Attending: Emergency Medicine | Admitting: Emergency Medicine

## 2021-11-06 ENCOUNTER — Encounter: Payer: Self-pay | Admitting: Emergency Medicine

## 2021-11-06 ENCOUNTER — Emergency Department: Payer: Medicaid Other

## 2021-11-06 ENCOUNTER — Other Ambulatory Visit: Payer: Self-pay

## 2021-11-06 DIAGNOSIS — R112 Nausea with vomiting, unspecified: Secondary | ICD-10-CM

## 2021-11-06 DIAGNOSIS — J9601 Acute respiratory failure with hypoxia: Secondary | ICD-10-CM

## 2021-11-06 DIAGNOSIS — R0789 Other chest pain: Secondary | ICD-10-CM

## 2021-11-06 DIAGNOSIS — R519 Headache, unspecified: Secondary | ICD-10-CM

## 2021-11-06 LAB — BRAIN NATRIURETIC PEPTIDE: B Natriuretic Peptide: 92.1 pg/mL (ref 0.0–100.0)

## 2021-11-06 LAB — URINALYSIS, ROUTINE W REFLEX MICROSCOPIC
Bilirubin Urine: NEGATIVE
Glucose, UA: NEGATIVE mg/dL
Hgb urine dipstick: NEGATIVE
Ketones, ur: NEGATIVE mg/dL
Leukocytes,Ua: NEGATIVE
Nitrite: NEGATIVE
Protein, ur: NEGATIVE mg/dL
Specific Gravity, Urine: 1.013 (ref 1.005–1.030)
pH: 6 (ref 5.0–8.0)

## 2021-11-06 LAB — CBC
HCT: 43.8 % (ref 36.0–46.0)
Hemoglobin: 13.9 g/dL (ref 12.0–15.0)
MCH: 30.3 pg (ref 26.0–34.0)
MCHC: 31.7 g/dL (ref 30.0–36.0)
MCV: 95.6 fL (ref 80.0–100.0)
Platelets: 264 10*3/uL (ref 150–400)
RBC: 4.58 MIL/uL (ref 3.87–5.11)
RDW: 14.2 % (ref 11.5–15.5)
WBC: 8.8 10*3/uL (ref 4.0–10.5)
nRBC: 0 % (ref 0.0–0.2)

## 2021-11-06 LAB — HEPATIC FUNCTION PANEL
ALT: 11 U/L (ref 0–44)
AST: 18 U/L (ref 15–41)
Albumin: 3.9 g/dL (ref 3.5–5.0)
Alkaline Phosphatase: 63 U/L (ref 38–126)
Bilirubin, Direct: 0.1 mg/dL (ref 0.0–0.2)
Indirect Bilirubin: 0.8 mg/dL (ref 0.3–0.9)
Total Bilirubin: 0.9 mg/dL (ref 0.3–1.2)
Total Protein: 7.5 g/dL (ref 6.5–8.1)

## 2021-11-06 LAB — BASIC METABOLIC PANEL
Anion gap: 9 (ref 5–15)
BUN: 15 mg/dL (ref 6–20)
CO2: 26 mmol/L (ref 22–32)
Calcium: 9.2 mg/dL (ref 8.9–10.3)
Chloride: 107 mmol/L (ref 98–111)
Creatinine, Ser: 0.68 mg/dL (ref 0.44–1.00)
GFR, Estimated: >60 mL/min (ref >60)
Glucose, Bld: 122 mg/dL — ABNORMAL HIGH (ref 70–99)
Potassium: 3.1 mmol/L — ABNORMAL LOW (ref 3.5–5.1)
Sodium: 142 mmol/L (ref 135–145)

## 2021-11-06 LAB — SARS CORONAVIRUS 2 BY RT PCR: SARS Coronavirus 2 by RT PCR: NEGATIVE

## 2021-11-06 LAB — LIPASE, BLOOD: Lipase: 29 U/L (ref 11–51)

## 2021-11-06 LAB — TROPONIN I (HIGH SENSITIVITY)
Troponin I (High Sensitivity): 10 ng/L (ref <18)
Troponin I (High Sensitivity): 12 ng/L (ref <18)

## 2021-11-06 MED ORDER — FOSFOMYCIN TROMETHAMINE 3 G PO PACK: 3.0000 g | PACK | Freq: Once | ORAL | Status: DC

## 2021-11-06 MED ORDER — FUROSEMIDE 40 MG PO TABS
20.0000 mg | ORAL_TABLET | Freq: Every day | ORAL | Status: DC
  Filled 2021-11-06: qty 1

## 2021-11-06 MED ORDER — FENTANYL CITRATE PF 50 MCG/ML IJ SOSY
50.0000 ug | PREFILLED_SYRINGE | Freq: Once | INTRAMUSCULAR | Status: AC
  Administered 2021-11-06: 50 ug via INTRAVENOUS
  Filled 2021-11-06: qty 1

## 2021-11-06 MED ORDER — ALLOPURINOL 300 MG PO TABS
300.0000 mg | ORAL_TABLET | Freq: Every day | ORAL | Status: DC
  Filled 2021-11-06: qty 1

## 2021-11-06 MED ORDER — ONDANSETRON HCL 4 MG/2ML IJ SOLN
4.0000 mg | Freq: Once | INTRAMUSCULAR | Status: AC
  Administered 2021-11-06: 4 mg via INTRAVENOUS
  Filled 2021-11-06: qty 2

## 2021-11-06 MED ORDER — ACETAMINOPHEN 500 MG PO TABS: 1000.0000 mg | ORAL_TABLET | Freq: Four times a day (QID) | ORAL | Status: DC | PRN

## 2021-11-06 MED ORDER — LORAZEPAM 2 MG/ML IJ SOLN
1.0000 mg | Freq: Once | INTRAMUSCULAR | Status: AC
  Administered 2021-11-06: 1 mg via INTRAVENOUS
  Filled 2021-11-06: qty 1

## 2021-11-06 MED ORDER — ASPIRIN 81 MG PO TBEC
81.0000 mg | DELAYED_RELEASE_TABLET | Freq: Every day | ORAL | Status: DC
  Filled 2021-11-06: qty 1

## 2021-11-06 MED ORDER — AMLODIPINE BESYLATE 5 MG PO TABS
10.0000 mg | ORAL_TABLET | Freq: Every day | ORAL | Status: DC
  Filled 2021-11-06: qty 2

## 2021-11-06 MED ORDER — LISINOPRIL 10 MG PO TABS
40.0000 mg | ORAL_TABLET | Freq: Every day | ORAL | Status: DC
  Filled 2021-11-06: qty 4

## 2021-11-06 MED ORDER — CLONIDINE HCL 0.1 MG PO TABS
0.1000 mg | ORAL_TABLET | Freq: Every day | ORAL | Status: DC
  Filled 2021-11-06: qty 1

## 2021-11-06 MED ORDER — METOCLOPRAMIDE HCL 5 MG/ML IJ SOLN
10.0000 mg | Freq: Once | INTRAMUSCULAR | Status: AC
  Administered 2021-11-06: 10 mg via INTRAVENOUS
  Filled 2021-11-06: qty 2

## 2021-11-06 MED ORDER — GABAPENTIN 300 MG PO CAPS: 600.0000 mg | ORAL_CAPSULE | Freq: Every day | ORAL | Status: DC

## 2021-11-06 MED ORDER — ONDANSETRON 4 MG PO TBDP: 4.0000 mg | ORAL_TABLET | Freq: Three times a day (TID) | ORAL | Status: DC | PRN

## 2021-11-06 MED ORDER — IPRATROPIUM-ALBUTEROL 0.5-2.5 (3) MG/3ML IN SOLN
3.0000 mL | Freq: Once | RESPIRATORY_TRACT | Status: AC
  Administered 2021-11-06: 3 mL via RESPIRATORY_TRACT
  Filled 2021-11-06: qty 3

## 2021-11-06 MED ORDER — CITALOPRAM HYDROBROMIDE 20 MG PO TABS
10.0000 mg | ORAL_TABLET | Freq: Every day | ORAL | Status: DC
  Filled 2021-11-06: qty 1

## 2021-11-06 MED ORDER — ACETAMINOPHEN 500 MG PO TABS
1000.0000 mg | ORAL_TABLET | Freq: Once | ORAL | Status: AC
  Administered 2021-11-06: 1000 mg via ORAL
  Filled 2021-11-06: qty 2

## 2021-11-06 MED ORDER — ALBUTEROL SULFATE (2.5 MG/3ML) 0.083% IN NEBU: 3.0000 mL | INHALATION_SOLUTION | RESPIRATORY_TRACT | Status: DC | PRN

## 2021-11-06 MED ORDER — TIOTROPIUM BROMIDE MONOHYDRATE 18 MCG IN CAPS
18.0000 ug | ORAL_CAPSULE | Freq: Every day | RESPIRATORY_TRACT | Status: DC
  Filled 2021-11-06 (×2): qty 5

## 2021-11-06 MED ORDER — IOHEXOL 350 MG/ML SOLN
100.0000 mL | Freq: Once | INTRAVENOUS | Status: AC | PRN
  Administered 2021-11-06: 100 mL via INTRAVENOUS

## 2021-11-06 MED ORDER — PANTOPRAZOLE SODIUM 40 MG PO TBEC
40.0000 mg | DELAYED_RELEASE_TABLET | Freq: Every day | ORAL | Status: DC
  Filled 2021-11-06: qty 1

## 2021-11-06 MED ORDER — HYDRALAZINE HCL 50 MG PO TABS: 100.0000 mg | ORAL_TABLET | Freq: Three times a day (TID) | ORAL | Status: DC

## 2021-11-06 MED ORDER — ATORVASTATIN CALCIUM 20 MG PO TABS
40.0000 mg | ORAL_TABLET | Freq: Every day | ORAL | Status: DC
  Filled 2021-11-06: qty 2

## 2021-11-06 MED ORDER — ONDANSETRON 4 MG PO TBDP: 4.0000 mg | ORAL_TABLET | Freq: Four times a day (QID) | ORAL | 0 refills | Status: DC | PRN

## 2021-11-06 NOTE — Social Work (Signed)
CSW acknowledges consult for home O2 and requests a walking sat note. RN provided note and information was sent to [employer]. They accepted referral and will deliver O2 to the home. Pt stating she does not need a tank to go home with. TOC will continue to follow for DC needs.

## 2021-11-06 NOTE — ED Provider Notes (Addendum)
Patient with history of COPD and supposed to be on chronic O2 2 L at home.  Patient received in signout from Dr. Leonides Schanz pending social work evaluation for home O2..  At rest her O2 saturation is in the 90s but with any ambulation her O2 saturation drops to 85 to 88%.  Her saturations were in the mid 90s on 2 L nasal cannula .  She will require supplemental oxygen due to chronic respiratory failure with hypoxia.    Merlyn Lot, MD 11/06/21 [phone]

## 2021-11-06 NOTE — ED Notes (Signed)
Pt is sitting in recliner  O2 off at present  the sats stay at 95 % with resting but drops to 88 -89% with movement/ambulation

## 2021-11-06 NOTE — ED Provider Notes (Signed)
[facility] Provider Note    Event Date/Time   First MD Initiated Contact with Patient 11/06/21 [phone]     (approximate)   History   Shortness of Breath and Chest Pain   HPI  [name] is a 56 y.o. female with history of atrial fibrillation, COPD chronically on 2 L nasal cannula, CHF, previous PE, thoracic aortic aneurysm, previous history of substance abuse who presents to the emergency department multiple complaints.  Complains of diffuse throbbing headache that has been ongoing for a few hours.  Also complains of chest pain and shortness of breath that started tonight.  Also complaining of generalized abdominal pain, nausea, vomiting and diarrhea.  No dysuria, hematuria, vaginal bleeding or discharge.  No numbness, tingling or focal weakness.  Denies history of chronic headaches.  No head injury.    History provided by patient.    Past Medical History:  Diagnosis Date   A-fib [facility]) 2010   Adrenal adenoma, left 10/06/2004   a.) CT 10/06/2004 --> measured 3.1 x 1.6 cm   Alcohol abuse    Allergy    Anginal pain (HCC)    Anxiety    Cardiac murmur    CHF (congestive heart failure) (HCC)    COPD (chronic obstructive pulmonary disease) (HCC)    Diverticulosis    Fatty liver    GERD (gastroesophageal reflux disease)    Gout    History of cocaine abuse ([place])    a.) reports that use discontinued following CVA in 2010.   History of kidney stones    Hypertension    Mild cognitive impairment    Non-healing non-surgical wound    right leg   Obesity    On supplemental oxygen therapy    a.) 2 L/[place]   OSA on CPAP    Pulmonary embolus ([place]) 02/19/2012   a.) CTA --> RIGHT upper lobe.   Stroke [facility]) 2010   Thoracic ascending aortic aneurysm ([place])    a.) CT 10/28/2019 --> measured 4.6 cm.   Thyroid disease    Tricuspid valvular regurgitation    a.) TTE 03/26/2018 --> mild to moderate.    Past Surgical History:  Procedure Laterality Date    CYSTOSCOPY WITH STENT PLACEMENT Left 10/02/2020   Procedure: CYSTOSCOPY WITH STENT PLACEMENT;  Surgeon: Billey Co, MD;  Location: [facility] ORS;  Service: Urology;  Laterality: Left;   CYSTOSCOPY/URETEROSCOPY/HOLMIUM LASER/STENT PLACEMENT Left 10/24/2020   Procedure: CYSTOSCOPY/URETEROSCOPY/HOLMIUM LASER/STENT EXCHANGE;  Surgeon: Billey Co, MD;  Location: [facility] ORS;  Service: Urology;  Laterality: Left;   LEFT HEART CATH AND CORONARY ANGIOGRAPHY N/A 03/27/2018   Procedure: LEFT HEART CATH AND CORONARY ANGIOGRAPHY;  Surgeon: Isaias Cowman, MD;  Location: [place] CV LAB;  Service: Cardiovascular;  Laterality: N/A;   ORIF ANKLE FRACTURE BIMALLEOLAR Left 09/20/2001   ORIF WRIST FRACTURE Bilateral 2010    MEDICATIONS:  Prior to Admission medications   Medication Sig Start Date End Date Taking? Authorizing Provider  allopurinol (ZYLOPRIM) 300 MG tablet Take 1 tablet (300 mg total) by mouth daily. 10/16/21   Jon Billings, NP  amLODipine (NORVASC) 10 MG tablet Take 1 tablet (10 mg total) by mouth daily. 10/14/21   Alisa Graff, FNP  aspirin EC 81 MG tablet Take 1 tablet (81 mg total) by mouth daily. 10/08/20   Jon Billings, NP  atorvastatin (LIPITOR) 40 MG tablet Take 1 tablet (40 mg total) by mouth daily. 10/14/21   Alisa Graff, FNP  Cholecalciferol (VITAMIN D3) 25  MCG (1000 UT) CAPS Take 1 capsule (1,000 Units total) by mouth daily. 10/16/21   Jon Billings, NP  citalopram (CELEXA) 10 MG tablet Take 1 tablet (10 mg total) by mouth daily. 10/16/21   Jon Billings, NP  cloNIDine (CATAPRES) 0.1 MG tablet Take 1 tablet (0.1 mg total) by mouth daily. 10/14/21   Alisa Graff, FNP  colchicine 0.6 MG tablet TAKE ONE (1) TABLET BY MOUTH TWICE DAILY AS NEEDED FOR GOUT FLARES 07/13/21   Jon Billings, NP  furosemide (LASIX) 20 MG tablet Take 1 tablet (20 mg total) by mouth daily. 10/14/21   Alisa Graff, FNP  gabapentin (NEURONTIN) 300 MG capsule Take 2 capsules  (600 mg total) by mouth at bedtime. 600 mg qhs 11/03/21   Jon Billings, NP  hydrALAZINE (APRESOLINE) 100 MG tablet Take 1 tablet (100 mg total) by mouth 3 (three) times daily. 11/03/21   Jon Billings, NP  lisinopril (ZESTRIL) 40 MG tablet Take 1 tablet (40 mg total) by mouth daily. 10/14/21   Alisa Graff, FNP  pantoprazole (PROTONIX) 40 MG tablet Take 1 tablet (40 mg total) by mouth daily. 11/03/21   Jon Billings, NP  potassium chloride (KLOR-CON) 20 MEQ packet Take 20 mEq by mouth daily. 10/14/21   Alisa Graff, FNP  PROAIR HFA 108 [phone] Base) MCG/ACT inhaler INHALE TWO (2) PUFFS BY MOUTH EVERY 6 HOURS AS NEEDED FOR WHEEZING OR FOR SHORTNESS OF BREATH 10/08/20   Jon Billings, NP  tiotropium (SPIRIVA HANDIHALER) 18 MCG inhalation capsule Place 1 capsule (18 mcg total) into inhaler and inhale daily. 11/03/21   Jon Billings, NP    Physical Exam   Triage Vital Signs: ED Triage Vitals  Enc Vitals Group     BP 11/06/21 0039 (!) 135/95     Pulse Rate 11/06/21 0039 100     Resp 11/06/21 0039 20     Temp 11/06/21 0039 98.2 F (36.8 C)     Temp Source 11/06/21 0231 Oral     SpO2 11/06/21 0039 98 %     Weight 11/06/21 0039 224 lb (101.6 kg)     Height 11/06/21 0039 '5\' 10"'$  (1.778 m)     Head Circumference --      Peak Flow --      Pain Score 11/06/21 0039 7     Pain Loc --      Pain Edu? --      Excl. in [place]? --     Most recent vital signs: Vitals:   11/06/21 0625 11/06/21 0946  BP: (!) 160/100 (!) 132/94  Pulse: 96 90  Resp:  20  Temp:  98.5 F (36.9 C)  SpO2: 97% 100%    CONSTITUTIONAL: Alert and oriented and responds appropriately to questions.  Appears slightly uncomfortable but afebrile, nontoxic HEAD: Normocephalic, atraumatic EYES: Conjunctivae clear, pupils appear equal, sclera nonicteric ENT: normal nose; moist mucous membranes NECK: Supple, normal ROM CARD: RRR; S1 and S2 appreciated; no murmurs, no clicks, no rubs, no gallops RESP: Normal  chest excursion without splinting or tachypnea; breath sounds clear and equal bilaterally; no wheezes, no rhonchi, no rales, no hypoxia on 2 L nasal cannula or respiratory distress, speaking full sentences ABD/GI: Normal bowel sounds; non-distended; soft, slightly tender to palpation diffusely, no rebound, no guarding, no peritoneal signs BACK: The back appears normal EXT: Normal ROM in all joints; no deformity noted, no edema; no cyanosis, no calf tenderness or calf swelling SKIN: Normal color for age and race; warm; no  rash on exposed skin NEURO: Moves all extremities equally, normal speech, normal sensation diffusely, cranial nerves II through XII intact, normal gait PSYCH: The patient's mood and manner are appropriate.   ED Results / Procedures / Treatments   LABS: (all labs ordered are listed, but only abnormal results are displayed) Labs Reviewed  BASIC METABOLIC PANEL - Abnormal; Notable for the following components:      Result Value   Potassium 3.1 (*)    Glucose, Bld 122 (*)    All other components within normal limits  URINALYSIS, ROUTINE W REFLEX MICROSCOPIC - Abnormal; Notable for the following components:   Color, Urine YELLOW (*)    APPearance CLEAR (*)    All other components within normal limits  SARS CORONAVIRUS 2 BY RT PCR  CBC  BRAIN NATRIURETIC PEPTIDE  HEPATIC FUNCTION PANEL  LIPASE, BLOOD  TROPONIN I (HIGH SENSITIVITY)  TROPONIN I (HIGH SENSITIVITY)     EKG:  EKG Interpretation  Date/Time:  Friday November 06 2021 06:06:25 EDT Ventricular Rate:  93 PR Interval:  148 QRS Duration: 88 QT Interval:  398 QTC Calculation: 494 R Axis:   -24 Text Interpretation: Sinus rhythm with Premature atrial complexes with Abberant conduction Moderate voltage criteria for LVH, may be normal variant ( R in aVL , Cornell product ) Nonspecific T wave abnormality Abnormal ECG When compared with ECG of 06-Nov-2021 00:42, Nonspecific T wave abnormality now evident in Anterior  leads Confirmed by UNCONFIRMED, DOCTOR ([id number]), editor Dwaine Deter ([id number]) on 11/06/2021 9:46:57 AM         RADIOLOGY: My personal review and interpretation of imaging: MRI brain negative.  CT head shows no bleed.  CTA chest, abdomen pelvis shows stable aneurysm without dissection.  No obvious PE.  No infiltrate or edema.  No other acute finding seen within the abdomen and pelvis.  I have personally reviewed all radiology reports.   MR BRAIN WO CONTRAST  Result Date: 11/06/2021 CLINICAL DATA:  Headache.  Chronic.  Shortness of breath. EXAM: MRI HEAD WITHOUT CONTRAST TECHNIQUE: Multiplanar, multiecho pulse sequences of the brain and surrounding structures were obtained without intravenous contrast. COMPARISON:  Same-day CT head. FINDINGS: Brain: There is no definite evidence of an acute infarct. There are scattered regions of increased signal on diffusion-weighted imaging, for example in the right internal capsule (series 19, image 81), they do not correlates on the coronal diffusion-weighted sequences, and are favored to be artifactual. No hydrocephalus, extra-axial collection or mass lesion. There is mild T2/FLAIR hyperintense signal abnormality in the region of previously questioned right basal ganglia heterogeneity, favored to represent sequela of moderate chronic microvascular ischemic change. T2/FLAIR hyperintense signal is also seen in the pons (series 46, image 20), which is also favored to represent sequela of chronic microvascular ischemic change. There are small foci of microhemorrhage in the right cerebellum and in the bilateral thalami, favored to represent chronic hypertensive microhemorrhages. Vascular: Normal flow voids. Skull and upper cervical spine: Normal marrow signal. Sinuses/Orbits: Negative. Other: None IMPRESSION: 1. No acute intracranial process. 2. Mild T2/FLAIR hyperintense signal abnormality in the region of previously noted right basal ganglia heterogeneity is favored to  represent sequela of chronic microvascular ischemic change. Electronically Signed   By: Marin Roberts M.D.   On: 11/06/2021 08:17   CT Angio Chest/Abd/Pel for Dissection W and/or Wo Contrast  Result Date: 11/06/2021 CLINICAL DATA:  56 year old female with history of shortness of breath and chest pain that is worse during deep inspiration. Suspected aortic aneurysm. EXAM: CT  ANGIOGRAPHY CHEST, ABDOMEN AND PELVIS TECHNIQUE: Non-contrast CT of the chest was initially obtained. Multidetector CT imaging through the chest, abdomen and pelvis was performed using the standard protocol during bolus administration of intravenous contrast. Multiplanar reconstructed images and MIPs were obtained and reviewed to evaluate the vascular anatomy. RADIATION DOSE REDUCTION: This exam was performed according to the departmental dose-optimization program which includes automated exposure control, adjustment of the mA and/or kV according to patient size and/or use of iterative reconstruction technique. CONTRAST:  185m OMNIPAQUE IOHEXOL 350 MG/ML SOLN COMPARISON:  Chest CTA 08/12/2021. CT of the abdomen and pelvis 10/02/2020. FINDINGS: CTA CHEST FINDINGS Cardiovascular: Initial precontrast images demonstrate no definite crescentic high attenuation associated with the wall of the thoracic aorta to clearly indicate the presence of acute intramural hemorrhage (entire study is slightly limited by cardiac motion). Ectasia of ascending thoracic aorta (4.2 cm in diameter). No evidence of thoracic aortic dissection on postcontrast images. Atherosclerotic calcifications throughout the thoracic aorta. No definite coronary artery calcifications. Heart size is mildly enlarged. There is no significant pericardial fluid, thickening or pericardial calcification. Mediastinum/Nodes: No pathologically enlarged mediastinal or hilar lymph nodes. Esophagus is unremarkable in appearance. No axillary lymphadenopathy. Lungs/Pleura: No acute consolidative  airspace disease. No pleural effusions. No suspicious appearing pulmonary nodules or masses are noted. Multiple areas of pleural thickening adjacent to old healed bilateral rib fractures incidentally noted. Musculoskeletal: Multiple vertebral body compression fractures at T3, T5 and T7, most severe at T3 where there is complete loss of anterior vertebral body height, similar to recent prior chest CTA. Multiple old healed bilateral rib fractures. There are no aggressive appearing lytic or blastic lesions noted in the visualized portions of the skeleton. Review of the MIP images confirms the above findings. CTA ABDOMEN AND PELVIS FINDINGS VASCULAR Aorta: Normal caliber aorta without aneurysm, dissection, vasculitis or significant stenosis. Celiac: Patent without evidence of aneurysm, dissection, vasculitis or significant stenosis. SMA: Patent without evidence of aneurysm, dissection, vasculitis or significant stenosis. Renals: Both renal arteries are patent without evidence of aneurysm, dissection, vasculitis, fibromuscular dysplasia or significant stenosis. IMA: Patent without evidence of aneurysm, dissection, vasculitis or significant stenosis. Inflow: Patent without evidence of aneurysm, dissection, vasculitis or significant stenosis. Veins: No obvious venous abnormality within the limitations of this arterial phase study. Review of the MIP images confirms the above findings. NON-VASCULAR Hepatobiliary: Liver has a shrunken appearance and nodular contour, indicative of underlying cirrhosis. No suspicious cystic or solid hepatic lesions. No intra or extrahepatic biliary ductal dilatation. Gallbladder is unremarkable in appearance. Pancreas: No pancreatic mass. No pancreatic ductal dilatation. No pancreatic or peripancreatic fluid collections or inflammatory changes. Spleen: Unremarkable. Adrenals/Urinary Tract: 1.5 cm low-attenuation lesion in the upper pole of the left kidney is compatible with a simple (Bosniak  class 1) cyst (no imaging follow-up recommended). Right kidney and right adrenal gland are normal in appearance. In the medial limb of the left adrenal gland there is a 3.2 x 1.9 cm mass (axial image 112 of series 6) which is intermediate attenuation (34 HU) and incompletely characterized on today's examination, but similar on numerous prior studies dating back to at least 2014, presumably a benign lesions such as a lipid poor adenoma. No hydroureteronephrosis. Urinary bladder is nearly completely decompressed, but otherwise unremarkable in appearance. Stomach/Bowel: The appearance of the stomach is normal. There is no pathologic dilatation of small bowel or colon. Few scattered colonic diverticula are noted, without surrounding inflammatory changes to indicate an acute diverticulitis at this time. Normal appendix. Lymphatic: No lymphadenopathy  noted in the abdomen or pelvis. Reproductive: Uterus and ovaries are unremarkable in appearance. Other: No significant volume of ascites.  No pneumoperitoneum. Musculoskeletal: Poorly defined sclerotic lesion in the anterior aspect of the L2 vertebral body measuring 2.0 x 1.5 x 2.2 cm (axial image 142 of series 6 and sagittal image 101 of series 10), progressively enlarging when compared to numerous prior examinations. Chronic compression fracture of inferior endplate of L3 with up to 30% loss of central vertebral body height, similar to prior studies. Review of the MIP images confirms the above findings. IMPRESSION: 1. No acute findings in the chest, abdomen or pelvis to account for the patient's symptoms. 2. Ectasia of ascending thoracic aorta (4.2 cm in diameter). No evidence of acute aortic syndrome. Recommend annual imaging followup by CTA or MRA. This recommendation follows 2010 ACCF/AHA/AATS/ACR/ASA/SCA/SCAI/SIR/STS/SVM Guidelines for the Diagnosis and Management of Patients with Thoracic Aortic Disease. Circulation. 2010; 121: E952-W413. Aortic aneurysm NOS  (ICD10-I71.9). 3. Cirrhosis. 4. Stable left adrenal mass compared to numerous prior examinations, most compatible with a benign lesion such as a lipid poor adenoma. 5. Colonic diverticulosis without evidence of acute diverticulitis at this time. 6. Poorly defined sclerotic lesion in the anterior aspect of the L2 vertebral body, slowly progressive compared to numerous prior examinations. Correlation with follow-up nonemergent bone scan should be considered if clinically appropriate. 7. Additional incidental findings, as above. Electronically Signed   By: Vinnie Langton M.D.   On: 11/06/2021 05:36   CT HEAD WO CONTRAST (5MM)  Result Date: 11/06/2021 CLINICAL DATA:  56 year old female with sudden severe headache and chest pain. EXAM: CT HEAD WITHOUT CONTRAST TECHNIQUE: Contiguous axial images were obtained from the base of the skull through the vertex without intravenous contrast. RADIATION DOSE REDUCTION: This exam was performed according to the departmental dose-optimization program which includes automated exposure control, adjustment of the mA and/or kV according to patient size and/or use of iterative reconstruction technique. COMPARISON:  Head CT 04/25/2018. FINDINGS: Brain: Stable cerebral volume, within normal limits for age. Incidental chronic dural calcifications, and mild vascular calcifications at the bilateral basal ganglia. No midline shift, ventriculomegaly, mass effect, evidence of mass lesion, intracranial hemorrhage or evidence of cortically based acute infarction. But heterogeneous hypodensity in the right basal ganglia appears progressed since 2020 on series 2, image 12. No convincing regional mass effect. Patchy additional bilateral cerebral white matter hypodensity appears more stable. No cortical encephalomalacia identified. Vascular: Mild Calcified atherosclerosis at the skull base. No suspicious intracranial vascular hyperdensity. Skull: No acute osseous abnormality identified.  Sinuses/Orbits: Visualized paranasal sinuses and mastoids are clear. Other: No acute orbit or scalp soft tissue finding. IMPRESSION: 1. Progressed heterogeneity of the right basal ganglia since 2020, probably small vessel disease related and age indeterminate. 2. No other acute intracranial abnormality identified, chronic white matter disease. Electronically Signed   By: Genevie Ann M.D.   On: 11/06/2021 05:26   DG Chest 2 View  Result Date: 11/06/2021 CLINICAL DATA:  Chest pain, dyspnea EXAM: CHEST - 2 VIEW COMPARISON:  10/17/2021 FINDINGS: Lungs are clear. No pneumothorax or pleural effusion. Thoracic aorta is aneurysmal and tortuous, stable since prior examination. Cardiac size is mildly enlarged, unchanged. Pulmonary vascularity is normal. Numerous healed bilateral rib fractures are again identified with left chest wall deformity again noted. Multiple midthoracic compression fractures are unchanged. No definite acute bone abnormality. IMPRESSION: 1. No radiographic evidence of acute cardiopulmonary disease. 2. Stable thoracic aortic aneurysm. 3. Stable cardiomegaly Electronically Signed   By: Linwood Dibbles.D.  On: 11/06/2021 01:06     PROCEDURES:  Critical Care performed: Yes, see critical care procedure note(s)   CRITICAL CARE Performed by: Cyril Mourning Mignon Bechler   Total critical care time: 40 minutes  Critical care time was exclusive of separately billable procedures and treating other patients.  Critical care was necessary to treat or prevent imminent or life-threatening deterioration.  Critical care was time spent personally by me on the following activities: development of treatment plan with patient and/or surrogate as well as nursing, discussions with consultants, evaluation of patient's response to treatment, examination of patient, obtaining history from patient or surrogate, ordering and performing treatments and interventions, ordering and review of laboratory studies, ordering and review  of radiographic studies, pulse oximetry and re-evaluation of patient's condition.   Procedures    IMPRESSION / MDM / ASSESSMENT AND PLAN / ED COURSE  I reviewed the triage vital signs and the nursing notes.    Patient here with multiple complaints.  She is complaining of generalized headache that started a few hours ago.  She does not describe it as sudden onset or thunderclap but does not have a history of chronic headaches.  No neurologic deficits.  No head injury.  No fevers, neck pain or neck stiffness.  Also complaining of chest pain and shortness of breath today.  Does have history of CHF and COPD.  Reports she is chronically on 2 L of oxygen at home.  Patient also complaining of generalized abdominal pain, vomiting and diarrhea here in the ED.     DIFFERENTIAL DIAGNOSIS (includes but not limited to):   Migraine headache, tension headache, intracranial hemorrhage, less likely CVA, TIA, meningitis, encephalitis, CVT.  Differential also includes ACS, PE, COPD, CHF, pneumonia, pneumothorax.  Differential for her abdominal pain includes viral gastroenteritis, colitis, diverticulitis, cystitis, UTI.   Patient's presentation is most consistent with acute presentation with potential threat to life or bodily function.   PLAN: We will obtain CBC, CMP, lipase, troponin x2, urinalysis, CT head, CTA dissection study to evaluate the chest, abdomen pelvis given both her history of thoracic aneurysm but also of pulmonary embolus.  We will give IV pain and nausea medicine.  We will give breathing treatment.  She does have some slightly diminished aeration but no wheezing and does not look volume overloaded today.   MEDICATIONS GIVEN IN ED: Medications  fentaNYL (SUBLIMAZE) injection 50 mcg (50 mcg Intravenous Given 11/06/21 0423)  ondansetron (ZOFRAN) injection 4 mg (4 mg Intravenous Given 11/06/21 0422)  ipratropium-albuterol (DUONEB) 0.5-2.5 (3) MG/3ML nebulizer solution 3 mL (3 mLs  Nebulization Given 11/06/21 0423)  iohexol (OMNIPAQUE) 350 MG/ML injection 100 mL (100 mLs Intravenous Contrast Given 11/06/21 0453)  ondansetron (ZOFRAN) injection 4 mg (4 mg Intravenous Given 11/06/21 0527)  ipratropium-albuterol (DUONEB) 0.5-2.5 (3) MG/3ML nebulizer solution 3 mL (3 mLs Nebulization Given 11/06/21 0626)  acetaminophen (TYLENOL) tablet 1,000 mg (1,000 mg Oral Given 11/06/21 0626)  metoCLOPramide (REGLAN) injection 10 mg (10 mg Intravenous Given 11/06/21 0625)  LORazepam (ATIVAN) injection 1 mg (1 mg Intravenous Given 11/06/21 0645)     ED COURSE: Patient has no leukocytosis.  Normal hemoglobin.  Normal electrolytes and renal function.  LFTs and lipase unremarkable.  Urine does not appear infected.  Troponin x2 negative.  BNP is normal.  CTA of the chest, abdomen pelvis reviewed and interpreted by myself and the radiologist.  There is no acute findings present.  Aortic aneurysm appears stable.  There is no dissection or rupture.  There is no obvious sign  of large pulmonary embolus.  No CHF, pneumonia or pneumothorax.  No acute findings within the abdomen or pelvis.  CT head reviewed and interpreted by myself and the radiologist and shows progressing heterogeneity of the right basal ganglia since 2020.  Will obtain MRI of the brain to rule out any acute abnormality that could be contributing to her headache today.  Patient still complaining of discomfort and nausea and shortness of breath.  We will give more medications.   Patient had received Ativan prior to MRI due to claustrophobia.  MRI obtained and reviewed and interpreted by myself and the radiologist and shows no acute abnormality.  Patient appears much more comfortable but on room air her sats are 85%.  She tells me just prior to discharge that she has not had any oxygen in her house for probably over a year but that she is supposed to wear it chronically and has not been able to get in touch with the representative to get  oxygen at home.  Will discuss with social work to see if we can get her oxygen for home.  If not, she will need admission given this hypoxia here although I suspect this is chronic.  I do not think that she is having a COPD exacerbation as her lungs right now are clear to auscultation.  Patient has not had any further vomiting or diarrhea here in the ED.  I feel she is safe to eat and drink normally.  Discharge paperwork has been written but she will be held in the ER until social work can confirm that we can get oxygen at home for her.   At this time, I do not feel there is any life-threatening condition present. I reviewed all nursing notes, vitals, pertinent previous records.  All lab and urine results, EKGs, imaging ordered have been independently reviewed and interpreted by myself.  I reviewed all available radiology reports from any imaging ordered this visit.  Based on my assessment, I feel the patient is safe to be discharged home without further emergent workup and can continue workup as an outpatient as needed. Discussed all findings, treatment plan as well as usual and customary return precautions.  They verbalize understanding and are comfortable with this plan.  Outpatient follow-up has been provided as needed.  All questions have been answered.    CONSULTS: No admission required at this time but if patient is not able to get oxygen for home, she will need admission for hypoxia.   OUTSIDE RECORDS REVIEWED: Reviewed patient's last cardiology visit in July 2023.       FINAL CLINICAL IMPRESSION(S) / ED DIAGNOSES   Final diagnoses:  Generalized headache  Atypical chest pain  Nausea vomiting and diarrhea  Acute respiratory failure with hypoxia ([place])     Rx / DC Orders   ED Discharge Orders          Ordered    ondansetron (ZOFRAN-ODT) 4 MG disintegrating tablet  Every 6 hours PRN        11/06/21 0838             Note:  This document was prepared using Dragon voice  recognition software and may include unintentional dictation errors.   Amado Andal, Delice Bison, DO 11/06/21 1157

## 2021-11-06 NOTE — ED Notes (Signed)
Pt to MRI

## 2021-11-06 NOTE — ED Notes (Signed)
Resting at present  States she was wearing O2 at home  states she doesn't know where it is

## 2021-11-06 NOTE — Discharge Instructions (Addendum)
You may take Tylenol 1000 mg every 6 hours as needed for pain.  Please call the ER or return if there is any issue with your home oxygen delivery set up by social work.

## 2021-11-06 NOTE — ED Triage Notes (Signed)
Pt presents via EMS with complaints of SOB with associated CP that started tonight. Pt endorses the pain when taking a deep breath. Respirations equal and unlabored. Hx of COPD - 2L chronically and CHF and has been compliant with her medications. Denies fevers, chills, N/V/D.

## 2021-11-06 NOTE — ED Notes (Signed)
Pt o2 removed  O2 sats dropped to 85 %

## 2021-11-09 ENCOUNTER — Telehealth: Payer: Self-pay

## 2021-11-09 NOTE — Telephone Encounter (Signed)
Transition Care Management Follow-up Telephone Call Date of discharge and from where: 11/06/21, [facility] ED How have you been since you were released from the hospital? "I've been ok, but they let me leave without explaining anything to me. I was supposed to receive oxygen at home but it has not been brought out yet." Any questions or concerns? No  Items Reviewed: Did the pt receive and understand the discharge instructions provided? Yes  Medications obtained and verified? Yes  Other? No  Any new allergies since your discharge? No  Dietary orders reviewed? Yes Do you have support at home? No   Home Care and Equipment/Supplies: Were home health services ordered? not applicable If so, what is the name of the agency? N/A  Has the agency set up a time to come to the patient's home? not applicable Were any new equipment or medical supplies ordered?  Yes What is the name of the medical supply agency? [employer] Were you able to get the supplies/equipment? Not yet Do you have any questions related to the use of the equipment or supplies? No  Functional Questionnaire: (I = Independent and D = Dependent) ADLs: I  Bathing/Dressing- I  Meal Prep- I  Eating- I  Maintaining continence- I  Transferring/Ambulation- I  Managing Meds- I  Follow up appointments reviewed:  PCP Hospital f/u appt confirmed? Yes  Scheduled to see Jon Billings, NP on 11/19/21 @ 2:40 pm. [facility] f/u appt confirmed? No   Are transportation arrangements needed? No  If their condition worsens, is the pt aware to call PCP or go to the Emergency Dept.? Yes Was the patient provided with contact information for the PCP's office or ED? Yes Was to pt encouraged to call back with questions or concerns? Yes

## 2021-11-19 ENCOUNTER — Ambulatory Visit: Payer: Medicaid Other | Admitting: Nurse Practitioner

## 2021-12-07 NOTE — Progress Notes (Unsigned)
There were no vitals taken for this visit.   Subjective:    Patient ID: [name], female    DOB: 1965/11/16, 56 y.o.   MRN: [id number]  HPI: [name] is a 56 y.o. female  No chief complaint on file.  OXYGEN Patient states she used to have oxygen but then it broke and she would like to get a new prescription.   HYPERTENSION / HYPERLIPIDEMIA Patient states she does weigh herself daily.  No change in her weight.   Satisfied with current treatment? yes Duration of hypertension: years BP monitoring frequency: not checking BP range:  BP medication side effects: no Past BP meds:  hydralazine clonidine, amlodipine, and lisinopril Duration of hyperlipidemia: years Cholesterol medication side effects: no Cholesterol supplements: none Past cholesterol medications: atorvastain (lipitor),  Medication compliance: excellent compliance Aspirin: yes Recent stressors: no Recurrent headaches: no Visual changes: no Palpitations: no Dyspnea: yes Chest pain: no Lower extremity edema: yes Dizzy/lightheaded: no  COPD COPD status: uncontrolled Satisfied with current treatment?: no Oxygen use: no - would like oxygen. She feels like she would benefit from oxygen to help her increase her ability to walk further Dyspnea frequency: daily Cough frequency: occasionally Rescue inhaler frequency:  a couple times a week Limitation of activity: yes Productive cough:  Last Spirometry:  Pneumovax: Up to Date Influenza: Not up to Date  ANXIETY Patient states she hasn't been taking the Celexa daily.  She does feel like she needs the medication. She did not realize that it was meant to be taken daily.  [place] Office Visit from 10/16/2021 in [place]  PHQ-9 Total Score 11         10/16/2021    8:35 AM 10/14/2021   10:58 AM 08/14/2021   10:34 AM 07/31/2021   10:57 AM  GAD 7 : Generalized Anxiety Score  Nervous, Anxious, on Edge 3 0 2 2  Control/stop worrying 2 0  2 2  Worry too much - different things 2 0 2 3  Trouble relaxing 2 0 2 3  Restless 2 0 2 2  Easily annoyed or irritable 2 0 2 2  Afraid - awful might happen 2 0 1 1  Total GAD 7 Score 15 0 13 15  Anxiety Difficulty Not difficult at all Not difficult at all Somewhat difficult Somewhat difficult    Patient states she has been having a lot of pain on her right pinky finger.  She would like to see Orthopedics to see if there is something that can be done to help her pain.  Relevant past medical, surgical, family and social history reviewed and updated as indicated. Interim medical history since our last visit reviewed. Allergies and medications reviewed and updated.  Review of Systems  Eyes:  Negative for visual disturbance.  Respiratory:  Positive for cough and shortness of breath. Negative for chest tightness.   Cardiovascular:  Positive for leg swelling. Negative for chest pain and palpitations.  Neurological:  Negative for dizziness and headaches.  Psychiatric/Behavioral:  Positive for dysphoric mood. Negative for suicidal ideas. The patient is nervous/anxious.     Per HPI unless specifically indicated above     Objective:    There were no vitals taken for this visit.  Wt Readings from Last 3 Encounters:  11/06/21 224 lb (101.6 kg)  10/17/21 224 lb (101.6 kg)  10/16/21 229 lb 1.6 oz (103.9 kg)    Physical Exam Vitals and nursing note reviewed.  Constitutional:  General: She is not in acute distress.    Appearance: Normal appearance. She is normal weight. She is not ill-appearing, toxic-appearing or diaphoretic.  HENT:     Head: Normocephalic.     Right Ear: External ear normal.     Left Ear: External ear normal.     Nose: Nose normal.     Mouth/Throat:     Mouth: Mucous membranes are moist.     Pharynx: Oropharynx is clear.  Eyes:     General:        Right eye: No discharge.        Left eye: No discharge.     Extraocular Movements: Extraocular movements intact.      Conjunctiva/sclera: Conjunctivae normal.     Pupils: Pupils are equal, round, and reactive to light.  Cardiovascular:     Rate and Rhythm: Normal rate and regular rhythm.     Heart sounds: No murmur heard. Pulmonary:     Effort: Pulmonary effort is normal. No respiratory distress.     Breath sounds: Normal breath sounds. No wheezing or rales.  Musculoskeletal:     Cervical back: Normal range of motion and neck supple.     Right lower leg: Edema present.     Left lower leg: Edema present.  Skin:    General: Skin is warm and dry.     Capillary Refill: Capillary refill takes less than 2 seconds.  Neurological:     General: No focal deficit present.     Mental Status: She is alert and oriented to person, place, and time. Mental status is at baseline.  Psychiatric:        Mood and Affect: Mood normal.        Behavior: Behavior normal.        Thought Content: Thought content normal.        Judgment: Judgment normal.     Results for orders placed or performed during the hospital encounter of 11/06/21  SARS Coronavirus 2 by RT PCR (hospital order, performed in [facility] hospital lab) *cepheid single result test* Anterior Nasal Swab   Specimen: Anterior Nasal Swab  Result Value Ref Range   SARS Coronavirus 2 by RT PCR NEGATIVE NEGATIVE  Basic metabolic panel  Result Value Ref Range   Sodium 142 135 - 145 mmol/L   Potassium 3.1 (L) 3.5 - 5.1 mmol/L   Chloride 107 98 - 111 mmol/L   CO2 26 22 - 32 mmol/L   Glucose, Bld 122 (H) 70 - 99 mg/dL   BUN 15 6 - 20 mg/dL   Creatinine, Ser 0.68 0.44 - 1.00 mg/dL   Calcium 9.2 8.9 - 10.3 mg/dL   GFR, Estimated >60 >60 mL/min   Anion gap 9 5 - 15  CBC  Result Value Ref Range   WBC 8.8 4.0 - 10.5 K/uL   RBC 4.58 3.87 - 5.11 MIL/uL   Hemoglobin 13.9 12.0 - 15.0 g/dL   HCT 43.8 36.0 - 46.0 %   MCV 95.6 80.0 - 100.0 fL   MCH 30.3 26.0 - 34.0 pg   MCHC 31.7 30.0 - 36.0 g/dL   RDW 14.2 11.5 - 15.5 %   Platelets 264 150 - 400 K/uL    nRBC 0.0 0.0 - 0.2 %  Brain natriuretic peptide  Result Value Ref Range   B Natriuretic Peptide 92.1 0.0 - 100.0 pg/mL  Hepatic function panel  Result Value Ref Range   Total Protein 7.5 6.5 - 8.1 g/dL  Albumin 3.9 3.5 - 5.0 g/dL   AST 18 15 - 41 U/L   ALT 11 0 - 44 U/L   Alkaline Phosphatase 63 38 - 126 U/L   Total Bilirubin 0.9 0.3 - 1.2 mg/dL   Bilirubin, Direct 0.1 0.0 - 0.2 mg/dL   Indirect Bilirubin 0.8 0.3 - 0.9 mg/dL  Lipase, blood  Result Value Ref Range   Lipase 29 11 - 51 U/L  Urinalysis, Routine w reflex microscopic  Result Value Ref Range   Color, Urine YELLOW (A) YELLOW   APPearance CLEAR (A) CLEAR   Specific Gravity, Urine 1.013 1.005 - 1.030   pH 6.0 5.0 - 8.0   Glucose, UA NEGATIVE NEGATIVE mg/dL   Hgb urine dipstick NEGATIVE NEGATIVE   Bilirubin Urine NEGATIVE NEGATIVE   Ketones, ur NEGATIVE NEGATIVE mg/dL   Protein, ur NEGATIVE NEGATIVE mg/dL   Nitrite NEGATIVE NEGATIVE   Leukocytes,Ua NEGATIVE NEGATIVE  Troponin I (High Sensitivity)  Result Value Ref Range   Troponin I (High Sensitivity) 10 <18 ng/L  Troponin I (High Sensitivity)  Result Value Ref Range   Troponin I (High Sensitivity) 12 <18 ng/L      Assessment & Plan:   Problem List Items Addressed This Visit       Cardiovascular and Mediastinum   Essential hypertension (Chronic)   Chronic diastolic heart failure (HCC) - Primary (Chronic)   Unstable angina (HCC)   Thoracic ascending aortic aneurysm (HCC)     Respiratory   COPD (chronic obstructive pulmonary disease) (HCC)     Endocrine   IFG (impaired fasting glucose)     Other   Hyperlipidemia   Anxiety     Follow up plan: No follow-ups on file.

## 2021-12-08 ENCOUNTER — Ambulatory Visit (INDEPENDENT_AMBULATORY_CARE_PROVIDER_SITE_OTHER): Payer: Medicaid Other | Admitting: Nurse Practitioner

## 2021-12-08 ENCOUNTER — Encounter: Payer: Self-pay | Admitting: Nurse Practitioner

## 2021-12-08 VITALS — BP 170/115 | HR 81 | Temp 98.6°F | Wt 224.9 lb

## 2021-12-08 DIAGNOSIS — I200 Unstable angina: Secondary | ICD-10-CM

## 2021-12-08 DIAGNOSIS — E785 Hyperlipidemia, unspecified: Secondary | ICD-10-CM

## 2021-12-08 DIAGNOSIS — I7121 Aneurysm of the ascending aorta, without rupture: Secondary | ICD-10-CM

## 2021-12-08 DIAGNOSIS — F419 Anxiety disorder, unspecified: Secondary | ICD-10-CM

## 2021-12-08 DIAGNOSIS — J449 Chronic obstructive pulmonary disease, unspecified: Secondary | ICD-10-CM

## 2021-12-08 DIAGNOSIS — I5032 Chronic diastolic (congestive) heart failure: Secondary | ICD-10-CM

## 2021-12-08 DIAGNOSIS — I10 Essential (primary) hypertension: Secondary | ICD-10-CM

## 2021-12-08 DIAGNOSIS — R7301 Impaired fasting glucose: Secondary | ICD-10-CM

## 2021-12-08 NOTE — Assessment & Plan Note (Signed)
Chronic. Followed by Cardiology. Continue to follow per their recommendations.

## 2021-12-08 NOTE — Assessment & Plan Note (Signed)
Chronic. Has not followed up with HF clinic.  Discussed with patient the importance of taking medications regularly.  On lasix '20mg'$  daily.  Labs ordered today.   - Reminded to call for an overnight weight gain of >2 pounds or a weekly weight gain of >5 pounds - not adding salt to food and read food labels. Reviewed the importance of keeping daily sodium intake to '2000mg'$  daily. - Avoid Ibuprofen products.

## 2021-12-08 NOTE — Assessment & Plan Note (Signed)
Chronic.  Controlled.  Continue with current medication regimen of celexa '20mg'$ .  Labs ordered today.  Return to clinic in 6 months for reevaluation.  Call sooner if concerns arise.

## 2021-12-08 NOTE — Assessment & Plan Note (Signed)
Chronic.  Controlled.  Continue with current medication regimen.  Labs ordered today.  Return to clinic in 6 months for reevaluation.  Call sooner if concerns arise.  ? ?

## 2021-12-08 NOTE — Assessment & Plan Note (Signed)
Chronic. Does Endorse SOB.  Has not been to Pulmonology for evaluation for oxygen. Would like nebulizer machine to use PRN.  New referral placed for patient to see Pulmonology.

## 2021-12-08 NOTE — Assessment & Plan Note (Signed)
Labs ordered at visit today.  Will make recommendations based on lab results.   

## 2021-12-08 NOTE — Assessment & Plan Note (Signed)
Chronic. Not well controlled. Has not taken medication for two days. Denies symptoms of Hypertensive Crisis. Discussed with patient the importance of taking medication daily to help get blood pressure better controlled.  Recommend being consistent with medications and follow up in 1 week.  Keep follow up with HF clinic.  Call sooner if concerns arise.

## 2021-12-09 ENCOUNTER — Telehealth: Payer: Self-pay

## 2021-12-09 LAB — COMPREHENSIVE METABOLIC PANEL
ALT: 9 IU/L (ref 0–32)
AST: 11 IU/L (ref 0–40)
Albumin/Globulin Ratio: 1.7 (ref 1.2–2.2)
Albumin: 4.3 g/dL (ref 3.8–4.9)
Alkaline Phosphatase: 78 IU/L (ref 44–121)
BUN/Creatinine Ratio: 25 — ABNORMAL HIGH (ref 9–23)
BUN: 18 mg/dL (ref 6–24)
Bilirubin Total: 1.3 mg/dL — ABNORMAL HIGH (ref 0.0–1.2)
CO2: 25 mmol/L (ref 20–29)
Calcium: 9.4 mg/dL (ref 8.7–10.2)
Chloride: 106 mmol/L (ref 96–106)
Creatinine, Ser: 0.73 mg/dL (ref 0.57–1.00)
Globulin, Total: 2.6 g/dL (ref 1.5–4.5)
Glucose: 92 mg/dL (ref 70–99)
Potassium: 3.4 mmol/L — ABNORMAL LOW (ref 3.5–5.2)
Sodium: 146 mmol/L — ABNORMAL HIGH (ref 134–144)
Total Protein: 6.9 g/dL (ref 6.0–8.5)
eGFR: 97 mL/min/{1.73_m2} (ref >59)

## 2021-12-09 LAB — LIPID PANEL
Chol/HDL Ratio: 2.7 ratio (ref 0.0–4.4)
Cholesterol, Total: 219 mg/dL — ABNORMAL HIGH (ref 100–199)
HDL: 80 mg/dL (ref >39)
LDL Chol Calc (NIH): 125 mg/dL — ABNORMAL HIGH (ref 0–99)
Triglycerides: 79 mg/dL (ref 0–149)
VLDL Cholesterol Cal: 14 mg/dL (ref 5–40)

## 2021-12-09 LAB — HEMOGLOBIN A1C
Est. average glucose Bld gHb Est-mCnc: 120 mg/dL
Hgb A1c MFr Bld: 5.8 % — ABNORMAL HIGH (ref 4.8–5.6)

## 2021-12-09 NOTE — Telephone Encounter (Signed)
Attempted to call patient to let her know we have rescheduled her mammogram at [facility] in [place] on 02/11/2022 at 11:20 AM. [place] [facility] has informed me that patient no showed for her Nov mammogram appt we had scheduled. Unable to LVM due to inbox being full.

## 2021-12-09 NOTE — Progress Notes (Signed)
Please let patient know that her lab work looks good.  Potassium is slightly low.  I recommend a potassium rich diet with foods such as bananas and potatoes.  We will recheck this at her visit next week.  Cholesterol and A1c look good.  No concerns at this time.  Please remind her to be consistent with her medication.

## 2021-12-09 NOTE — Telephone Encounter (Signed)
-----   Message from Jon Billings, NP sent at 12/08/2021  2:40 PM EST ----- Can we make her a mammogram appt.

## 2021-12-14 NOTE — Progress Notes (Deleted)
   There were no vitals taken for this visit.   Subjective:    Patient ID: [name], female    DOB: [birth year], 56 y.o.   MRN: [id number]  HPI: [name] is a 56 y.o. female  No chief complaint on file.  HYPERTENSION {Blank single:19197::"without","with"} Chronic Kidney Disease Hypertension status: {Blank single:19197::"controlled","uncontrolled","better","worse","exacerbated","stable"}  Satisfied with current treatment? {Blank single:19197::"yes","no"} Duration of hypertension: {Blank single:19197::"chronic","months","years"} BP monitoring frequency:  {Blank single:19197::"not checking","rarely","daily","weekly","monthly","a few times a day","a few times a week","a few times a month"} BP range:  BP medication side effects:  {Blank single:19197::"yes","no"} Medication compliance: {Blank single:19197::"excellent compliance","good compliance","fair compliance","poor compliance"} Previous BP meds:{Blank [id number]:83818::"MCRF","VOHKGOVPCH","EKBTCYELYH/TMBPJPETKK","OECXFQHK","UVJDYNXGZF","POIPPGFQMK/JIZX","YOFVWAQLRJ (bystolic)","carvedilol","chlorthalidone","clonidine","diltiazem","exforge HCT","HCTZ","irbesartan (avapro)","labetalol","lisinopril","lisinopril-HCTZ","losartan (cozaar)","methyldopa","nifedipine","olmesartan (benicar)","olmesartan-HCTZ","quinapril","ramipril","spironalactone","tekturna","valsartan","valsartan-HCTZ","verapamil"} Aspirin: {Blank single:19197::"yes","no"} Recurrent headaches: {Blank single:19197::"yes","no"} Visual changes: {Blank single:19197::"yes","no"} Palpitations: {Blank single:19197::"yes","no"} Dyspnea: {Blank single:19197::"yes","no"} Chest pain: {Blank single:19197::"yes","no"} Lower extremity edema: {Blank single:19197::"yes","no"} Dizzy/lightheaded: {Blank single:19197::"yes","no"}  Relevant past medical, surgical, family and social history reviewed and updated as indicated. Interim medical history since our last visit reviewed. Allergies  and medications reviewed and updated.  Review of Systems  Per HPI unless specifically indicated above     Objective:    There were no vitals taken for this visit.  Wt Readings from Last 3 Encounters:  12/08/21 224 lb 14.4 oz (102 kg)  11/06/21 224 lb (101.6 kg)  10/17/21 224 lb (101.6 kg)    Physical Exam  Results for orders placed or performed in visit on 12/08/21  Comp Met (CMET)  Result Value Ref Range   Glucose 92 70 - 99 mg/dL   BUN 18 6 - 24 mg/dL   Creatinine, Ser 0.73 0.57 - 1.00 mg/dL   eGFR 97 >59 mL/min/1.73   BUN/Creatinine Ratio 25 (H) 9 - 23   Sodium 146 (H) 134 - 144 mmol/L   Potassium 3.4 (L) 3.5 - 5.2 mmol/L   Chloride 106 96 - 106 mmol/L   CO2 25 20 - 29 mmol/L   Calcium 9.4 8.7 - 10.2 mg/dL   Total Protein 6.9 6.0 - 8.5 g/dL   Albumin 4.3 3.8 - 4.9 g/dL   Globulin, Total 2.6 1.5 - 4.5 g/dL   Albumin/Globulin Ratio 1.7 1.2 - 2.2   Bilirubin Total 1.3 (H) 0.0 - 1.2 mg/dL   Alkaline Phosphatase 78 44 - 121 IU/L   AST 11 0 - 40 IU/L   ALT 9 0 - 32 IU/L  Lipid Profile  Result Value Ref Range   Cholesterol, Total 219 (H) 100 - 199 mg/dL   Triglycerides 79 0 - 149 mg/dL   HDL 80 >39 mg/dL   VLDL Cholesterol Cal 14 5 - 40 mg/dL   LDL Chol Calc (NIH) 125 (H) 0 - 99 mg/dL   Chol/HDL Ratio 2.7 0.0 - 4.4 ratio  HgB A1c  Result Value Ref Range   Hgb A1c MFr Bld 5.8 (H) 4.8 - 5.6 %   Est. average glucose Bld gHb Est-mCnc 120 mg/dL      Assessment & Plan:   Problem List Items Addressed This Visit       Cardiovascular and Mediastinum   Essential hypertension - Primary (Chronic)     Follow up plan: No follow-ups on file.

## 2021-12-15 ENCOUNTER — Ambulatory Visit: Payer: Medicaid Other | Admitting: Nurse Practitioner

## 2021-12-15 DIAGNOSIS — I10 Essential (primary) hypertension: Secondary | ICD-10-CM

## 2022-01-11 ENCOUNTER — Inpatient Hospital Stay
Admission: EM | Admit: 2022-01-11 | Discharge: 2022-01-13 | DRG: 872 | Disposition: A | Discharge status: Home / Self Care | Payer: Medicaid Other | Attending: Internal Medicine | Admitting: Internal Medicine

## 2022-01-11 ENCOUNTER — Other Ambulatory Visit: Payer: Self-pay

## 2022-01-11 ENCOUNTER — Emergency Department: Payer: Medicaid Other

## 2022-01-11 ENCOUNTER — Inpatient Hospital Stay: Payer: Medicaid Other

## 2022-01-11 ENCOUNTER — Encounter: Payer: Self-pay | Admitting: Internal Medicine

## 2022-01-11 DIAGNOSIS — F418 Other specified anxiety disorders: Secondary | ICD-10-CM | POA: Diagnosis present

## 2022-01-11 DIAGNOSIS — Z87891 Personal history of nicotine dependence: Secondary | ICD-10-CM

## 2022-01-11 DIAGNOSIS — R1084 Generalized abdominal pain: Principal | ICD-10-CM

## 2022-01-11 DIAGNOSIS — Z79899 Other long term (current) drug therapy: Secondary | ICD-10-CM | POA: Diagnosis not present

## 2022-01-11 DIAGNOSIS — J449 Chronic obstructive pulmonary disease, unspecified: Secondary | ICD-10-CM | POA: Diagnosis present

## 2022-01-11 DIAGNOSIS — I2510 Atherosclerotic heart disease of native coronary artery without angina pectoris: Secondary | ICD-10-CM | POA: Diagnosis present

## 2022-01-11 DIAGNOSIS — Z885 Allergy status to narcotic agent status: Secondary | ICD-10-CM

## 2022-01-11 DIAGNOSIS — Z20822 Contact with and (suspected) exposure to covid-19: Secondary | ICD-10-CM | POA: Diagnosis present

## 2022-01-11 DIAGNOSIS — I700 Atherosclerosis of aorta: Secondary | ICD-10-CM | POA: Diagnosis present

## 2022-01-11 DIAGNOSIS — M109 Gout, unspecified: Secondary | ICD-10-CM | POA: Diagnosis present

## 2022-01-11 DIAGNOSIS — Z7982 Long term (current) use of aspirin: Secondary | ICD-10-CM

## 2022-01-11 DIAGNOSIS — I110 Hypertensive heart disease with heart failure: Secondary | ICD-10-CM | POA: Diagnosis present

## 2022-01-11 DIAGNOSIS — R197 Diarrhea, unspecified: Secondary | ICD-10-CM | POA: Diagnosis not present

## 2022-01-11 DIAGNOSIS — Z880 Allergy status to penicillin: Secondary | ICD-10-CM

## 2022-01-11 DIAGNOSIS — I5032 Chronic diastolic (congestive) heart failure: Secondary | ICD-10-CM | POA: Diagnosis present

## 2022-01-11 DIAGNOSIS — I639 Cerebral infarction, unspecified: Secondary | ICD-10-CM | POA: Diagnosis present

## 2022-01-11 DIAGNOSIS — K219 Gastro-esophageal reflux disease without esophagitis: Secondary | ICD-10-CM | POA: Diagnosis present

## 2022-01-11 DIAGNOSIS — A4189 Other specified sepsis: Principal | ICD-10-CM | POA: Diagnosis present

## 2022-01-11 DIAGNOSIS — G4733 Obstructive sleep apnea (adult) (pediatric): Secondary | ICD-10-CM | POA: Diagnosis present

## 2022-01-11 DIAGNOSIS — E876 Hypokalemia: Secondary | ICD-10-CM | POA: Diagnosis present

## 2022-01-11 DIAGNOSIS — Z8673 Personal history of transient ischemic attack (TIA), and cerebral infarction without residual deficits: Secondary | ICD-10-CM

## 2022-01-11 DIAGNOSIS — Z9981 Dependence on supplemental oxygen: Secondary | ICD-10-CM

## 2022-01-11 DIAGNOSIS — K760 Fatty (change of) liver, not elsewhere classified: Secondary | ICD-10-CM | POA: Diagnosis present

## 2022-01-11 DIAGNOSIS — Z86711 Personal history of pulmonary embolism: Secondary | ICD-10-CM

## 2022-01-11 DIAGNOSIS — R112 Nausea with vomiting, unspecified: Secondary | ICD-10-CM | POA: Diagnosis not present

## 2022-01-11 DIAGNOSIS — I10 Essential (primary) hypertension: Secondary | ICD-10-CM | POA: Diagnosis not present

## 2022-01-11 DIAGNOSIS — I482 Chronic atrial fibrillation, unspecified: Secondary | ICD-10-CM | POA: Diagnosis present

## 2022-01-11 DIAGNOSIS — Z789 Other specified health status: Secondary | ICD-10-CM | POA: Diagnosis present

## 2022-01-11 DIAGNOSIS — Z6835 Body mass index (BMI) 35.0-35.9, adult: Secondary | ICD-10-CM | POA: Diagnosis not present

## 2022-01-11 DIAGNOSIS — J101 Influenza due to other identified influenza virus with other respiratory manifestations: Secondary | ICD-10-CM | POA: Diagnosis present

## 2022-01-11 DIAGNOSIS — A419 Sepsis, unspecified organism: Secondary | ICD-10-CM | POA: Diagnosis present

## 2022-01-11 DIAGNOSIS — E669 Obesity, unspecified: Secondary | ICD-10-CM | POA: Diagnosis present

## 2022-01-11 DIAGNOSIS — Z7951 Long term (current) use of inhaled steroids: Secondary | ICD-10-CM

## 2022-01-11 DIAGNOSIS — Z800 Family history of malignant neoplasm of digestive organs: Secondary | ICD-10-CM

## 2022-01-11 DIAGNOSIS — E785 Hyperlipidemia, unspecified: Secondary | ICD-10-CM | POA: Diagnosis present

## 2022-01-11 DIAGNOSIS — Z803 Family history of malignant neoplasm of breast: Secondary | ICD-10-CM

## 2022-01-11 LAB — COMPREHENSIVE METABOLIC PANEL
ALT: 10 U/L (ref 0–44)
AST: 19 U/L (ref 15–41)
Albumin: 3.9 g/dL (ref 3.5–5.0)
Alkaline Phosphatase: 68 U/L (ref 38–126)
Anion gap: 10 (ref 5–15)
BUN: 11 mg/dL (ref 6–20)
CO2: 28 mmol/L (ref 22–32)
Calcium: 9.2 mg/dL (ref 8.9–10.3)
Chloride: 101 mmol/L (ref 98–111)
Creatinine, Ser: 0.9 mg/dL (ref 0.44–1.00)
GFR, Estimated: >60 mL/min (ref >60)
Glucose, Bld: 101 mg/dL — ABNORMAL HIGH (ref 70–99)
Potassium: 3.1 mmol/L — ABNORMAL LOW (ref 3.5–5.1)
Sodium: 139 mmol/L (ref 135–145)
Total Bilirubin: 1.9 mg/dL — ABNORMAL HIGH (ref 0.3–1.2)
Total Protein: 7.7 g/dL (ref 6.5–8.1)

## 2022-01-11 LAB — URINALYSIS, ROUTINE W REFLEX MICROSCOPIC
Bilirubin Urine: NEGATIVE
Glucose, UA: NEGATIVE mg/dL
Ketones, ur: NEGATIVE mg/dL
Nitrite: NEGATIVE
Protein, ur: 30 mg/dL — AB
Specific Gravity, Urine: >1.046 — ABNORMAL HIGH (ref 1.005–1.030)
pH: 6 (ref 5.0–8.0)

## 2022-01-11 LAB — CBC
HCT: 40.7 % (ref 36.0–46.0)
Hemoglobin: 13 g/dL (ref 12.0–15.0)
MCH: 30.6 pg (ref 26.0–34.0)
MCHC: 31.9 g/dL (ref 30.0–36.0)
MCV: 95.8 fL (ref 80.0–100.0)
Platelets: 189 10*3/uL (ref 150–400)
RBC: 4.25 MIL/uL (ref 3.87–5.11)
RDW: 14.1 % (ref 11.5–15.5)
WBC: 8.2 10*3/uL (ref 4.0–10.5)
nRBC: 0 % (ref 0.0–0.2)

## 2022-01-11 LAB — RESP PANEL BY RT-PCR (RSV, FLU A&B, COVID)  RVPGX2
Influenza A by PCR: POSITIVE — AB
Influenza B by PCR: NEGATIVE
Resp Syncytial Virus by PCR: NEGATIVE
SARS Coronavirus 2 by RT PCR: NEGATIVE

## 2022-01-11 LAB — LIPASE, BLOOD: Lipase: 31 U/L (ref 11–51)

## 2022-01-11 LAB — PROCALCITONIN: Procalcitonin: <0.1 ng/mL

## 2022-01-11 LAB — MAGNESIUM: Magnesium: 1.9 mg/dL (ref 1.7–2.4)

## 2022-01-11 LAB — LACTIC ACID, PLASMA
Lactic Acid, Venous: 0.8 mmol/L (ref 0.5–1.9)
Lactic Acid, Venous: 0.9 mmol/L (ref 0.5–1.9)

## 2022-01-11 LAB — HIV ANTIBODY (ROUTINE TESTING W REFLEX): HIV Screen 4th Generation wRfx: NONREACTIVE

## 2022-01-11 LAB — BRAIN NATRIURETIC PEPTIDE: B Natriuretic Peptide: 76 pg/mL (ref 0.0–100.0)

## 2022-01-11 MED ORDER — FUROSEMIDE 40 MG PO TABS: 20.0000 mg | ORAL_TABLET | Freq: Every day | ORAL | Status: DC

## 2022-01-11 MED ORDER — METOPROLOL TARTRATE 5 MG/5ML IV SOLN: 5.0000 mg | INTRAVENOUS | Status: DC | PRN

## 2022-01-11 MED ORDER — POTASSIUM CHLORIDE CRYS ER 20 MEQ PO TBCR
40.0000 meq | EXTENDED_RELEASE_TABLET | Freq: Once | ORAL | Status: AC
  Administered 2022-01-11: 40 meq via ORAL
  Filled 2022-01-11: qty 2

## 2022-01-11 MED ORDER — IOHEXOL 300 MG/ML  SOLN
100.0000 mL | Freq: Once | INTRAMUSCULAR | Status: AC | PRN
  Administered 2022-01-11: 100 mL via INTRAVENOUS

## 2022-01-11 MED ORDER — HYDROCOD POLI-CHLORPHE POLI ER 10-8 MG/5ML PO SUER
5.0000 mL | Freq: Once | ORAL | Status: AC
  Administered 2022-01-11: 5 mL via ORAL
  Filled 2022-01-11: qty 5

## 2022-01-11 MED ORDER — ALBUTEROL SULFATE (2.5 MG/3ML) 0.083% IN NEBU: 2.5000 mg | INHALATION_SOLUTION | RESPIRATORY_TRACT | Status: DC | PRN

## 2022-01-11 MED ORDER — ONDANSETRON 4 MG PO TBDP: 4.0000 mg | ORAL_TABLET | Freq: Four times a day (QID) | ORAL | Status: DC | PRN

## 2022-01-11 MED ORDER — IPRATROPIUM-ALBUTEROL 0.5-2.5 (3) MG/3ML IN SOLN
3.0000 mL | Freq: Four times a day (QID) | RESPIRATORY_TRACT | Status: DC
  Administered 2022-01-11 – 2022-01-12 (×5): 3 mL via RESPIRATORY_TRACT
  Filled 2022-01-11 (×5): qty 3

## 2022-01-11 MED ORDER — LORAZEPAM 1 MG PO TABS
1.0000 mg | ORAL_TABLET | ORAL | Status: DC | PRN
  Administered 2022-01-11: 2 mg via ORAL
  Filled 2022-01-11: qty 2

## 2022-01-11 MED ORDER — ALLOPURINOL 100 MG PO TABS
300.0000 mg | ORAL_TABLET | Freq: Every day | ORAL | Status: DC
  Administered 2022-01-11 – 2022-01-13 (×3): 300 mg via ORAL
  Filled 2022-01-11 (×2): qty 3
  Filled 2022-01-11: qty 1

## 2022-01-11 MED ORDER — POTASSIUM CHLORIDE 10 MEQ/100ML IV SOLN
10.0000 meq | Freq: Once | INTRAVENOUS | Status: AC
  Administered 2022-01-11: 10 meq via INTRAVENOUS
  Filled 2022-01-11: qty 100

## 2022-01-11 MED ORDER — TIOTROPIUM BROMIDE MONOHYDRATE 18 MCG IN CAPS
18.0000 ug | ORAL_CAPSULE | Freq: Every day | RESPIRATORY_TRACT | Status: DC
  Filled 2022-01-11 (×3): qty 5

## 2022-01-11 MED ORDER — LORAZEPAM 2 MG/ML IJ SOLN: 0.0000 mg | Freq: Four times a day (QID) | INTRAMUSCULAR | Status: AC

## 2022-01-11 MED ORDER — IOHEXOL 350 MG/ML SOLN
60.0000 mL | Freq: Once | INTRAVENOUS | Status: AC | PRN
  Administered 2022-01-11: 60 mL via INTRAVENOUS

## 2022-01-11 MED ORDER — ATORVASTATIN CALCIUM 20 MG PO TABS
40.0000 mg | ORAL_TABLET | Freq: Every day | ORAL | Status: DC
  Administered 2022-01-11 – 2022-01-13 (×3): 40 mg via ORAL
  Filled 2022-01-11 (×3): qty 2

## 2022-01-11 MED ORDER — LISINOPRIL 10 MG PO TABS: 40.0000 mg | ORAL_TABLET | Freq: Every day | ORAL | Status: DC

## 2022-01-11 MED ORDER — ENOXAPARIN SODIUM 60 MG/0.6ML IJ SOSY
0.5000 mg/kg | PREFILLED_SYRINGE | INTRAMUSCULAR | Status: DC
  Administered 2022-01-11 – 2022-01-13 (×3): 50 mg via SUBCUTANEOUS
  Filled 2022-01-11 (×3): qty 0.6

## 2022-01-11 MED ORDER — ASPIRIN 81 MG PO TBEC
81.0000 mg | DELAYED_RELEASE_TABLET | Freq: Every day | ORAL | Status: DC
  Administered 2022-01-11 – 2022-01-13 (×3): 81 mg via ORAL
  Filled 2022-01-11 (×3): qty 1

## 2022-01-11 MED ORDER — COLCHICINE 0.6 MG PO TABS: 0.6000 mg | ORAL_TABLET | Freq: Two times a day (BID) | ORAL | Status: DC | PRN

## 2022-01-11 MED ORDER — SODIUM CHLORIDE 0.9 % IV BOLUS
1000.0000 mL | Freq: Once | INTRAVENOUS | Status: AC
  Administered 2022-01-11: 1000 mL via INTRAVENOUS

## 2022-01-11 MED ORDER — DM-GUAIFENESIN ER 30-600 MG PO TB12: 1.0000 | ORAL_TABLET | Freq: Two times a day (BID) | ORAL | Status: DC | PRN

## 2022-01-11 MED ORDER — METOPROLOL TARTRATE 25 MG PO TABS
25.0000 mg | ORAL_TABLET | Freq: Two times a day (BID) | ORAL | Status: DC
  Administered 2022-01-11 – 2022-01-13 (×5): 25 mg via ORAL
  Filled 2022-01-11 (×5): qty 1

## 2022-01-11 MED ORDER — PANTOPRAZOLE SODIUM 40 MG PO TBEC
40.0000 mg | DELAYED_RELEASE_TABLET | Freq: Every day | ORAL | Status: DC
  Administered 2022-01-11 – 2022-01-13 (×3): 40 mg via ORAL
  Filled 2022-01-11 (×3): qty 1

## 2022-01-11 MED ORDER — ACETAMINOPHEN 650 MG RE SUPP: 650.0000 mg | Freq: Four times a day (QID) | RECTAL | Status: DC | PRN

## 2022-01-11 MED ORDER — POTASSIUM CHLORIDE 20 MEQ PO PACK: 20.0000 meq | PACK | Freq: Every day | ORAL | Status: DC

## 2022-01-11 MED ORDER — AZITHROMYCIN 250 MG PO TABS
250.0000 mg | ORAL_TABLET | Freq: Every day | ORAL | Status: DC
  Administered 2022-01-12 – 2022-01-13 (×2): 250 mg via ORAL
  Filled 2022-01-11 (×2): qty 1

## 2022-01-11 MED ORDER — PHENOL 1.4 % MT LIQD: 1.0000 | OROMUCOSAL | Status: DC | PRN

## 2022-01-11 MED ORDER — POTASSIUM CHLORIDE IN NACL 20-0.9 MEQ/L-% IV SOLN
INTRAVENOUS | Status: DC
  Filled 2022-01-11: qty 1000

## 2022-01-11 MED ORDER — ONDANSETRON HCL 4 MG PO TABS: 4.0000 mg | ORAL_TABLET | Freq: Four times a day (QID) | ORAL | Status: DC | PRN

## 2022-01-11 MED ORDER — ONDANSETRON HCL 4 MG/2ML IJ SOLN
4.0000 mg | Freq: Four times a day (QID) | INTRAMUSCULAR | Status: DC | PRN
  Administered 2022-01-11: 4 mg via INTRAVENOUS
  Filled 2022-01-11: qty 2

## 2022-01-11 MED ORDER — ONDANSETRON HCL 4 MG/2ML IJ SOLN
4.0000 mg | Freq: Once | INTRAMUSCULAR | Status: AC
  Administered 2022-01-11: 4 mg via INTRAVENOUS
  Filled 2022-01-11: qty 2

## 2022-01-11 MED ORDER — VITAMIN D 25 MCG (1000 UNIT) PO TABS
1000.0000 [IU] | ORAL_TABLET | Freq: Every day | ORAL | Status: DC
  Administered 2022-01-11 – 2022-01-13 (×3): 1000 [IU] via ORAL
  Filled 2022-01-11 (×3): qty 1

## 2022-01-11 MED ORDER — FOLIC ACID 1 MG PO TABS
1.0000 mg | ORAL_TABLET | Freq: Every day | ORAL | Status: DC
  Administered 2022-01-11 – 2022-01-13 (×3): 1 mg via ORAL
  Filled 2022-01-11 (×3): qty 1

## 2022-01-11 MED ORDER — MAGNESIUM HYDROXIDE 400 MG/5ML PO SUSP: 30.0000 mL | Freq: Every day | ORAL | Status: DC | PRN

## 2022-01-11 MED ORDER — GABAPENTIN 300 MG PO CAPS
600.0000 mg | ORAL_CAPSULE | Freq: Every day | ORAL | Status: DC
  Administered 2022-01-11 – 2022-01-12 (×2): 600 mg via ORAL
  Filled 2022-01-11 (×2): qty 2

## 2022-01-11 MED ORDER — TRAZODONE HCL 50 MG PO TABS: 25.0000 mg | ORAL_TABLET | Freq: Every evening | ORAL | Status: DC | PRN

## 2022-01-11 MED ORDER — AZITHROMYCIN 500 MG PO TABS
500.0000 mg | ORAL_TABLET | Freq: Every day | ORAL | Status: AC
  Administered 2022-01-11: 500 mg via ORAL
  Filled 2022-01-11: qty 1

## 2022-01-11 MED ORDER — OSELTAMIVIR PHOSPHATE 75 MG PO CAPS
75.0000 mg | ORAL_CAPSULE | Freq: Two times a day (BID) | ORAL | Status: DC
  Administered 2022-01-11 – 2022-01-13 (×5): 75 mg via ORAL
  Filled 2022-01-11 (×7): qty 1

## 2022-01-11 MED ORDER — THIAMINE MONONITRATE 100 MG PO TABS
100.0000 mg | ORAL_TABLET | Freq: Every day | ORAL | Status: DC
  Administered 2022-01-11 – 2022-01-13 (×3): 100 mg via ORAL
  Filled 2022-01-11 (×3): qty 1

## 2022-01-11 MED ORDER — CLONIDINE HCL 0.1 MG PO TABS
0.1000 mg | ORAL_TABLET | Freq: Every day | ORAL | Status: DC
  Administered 2022-01-11 – 2022-01-13 (×3): 0.1 mg via ORAL
  Filled 2022-01-11 (×3): qty 1

## 2022-01-11 MED ORDER — ONDANSETRON 4 MG PO TBDP: 4.0000 mg | ORAL_TABLET | Freq: Once | ORAL | Status: DC

## 2022-01-11 MED ORDER — LORAZEPAM 2 MG/ML IJ SOLN: 0.0000 mg | Freq: Two times a day (BID) | INTRAMUSCULAR | Status: DC

## 2022-01-11 MED ORDER — SODIUM CHLORIDE 0.9 % IV BOLUS
500.0000 mL | Freq: Once | INTRAVENOUS | Status: AC
  Administered 2022-01-11: 500 mL via INTRAVENOUS

## 2022-01-11 MED ORDER — AMLODIPINE BESYLATE 10 MG PO TABS
10.0000 mg | ORAL_TABLET | Freq: Every day | ORAL | Status: DC
  Administered 2022-01-11 – 2022-01-13 (×3): 10 mg via ORAL
  Filled 2022-01-11: qty 1
  Filled 2022-01-11: qty 2
  Filled 2022-01-11: qty 1

## 2022-01-11 MED ORDER — HYDRALAZINE HCL 100 MG PO TABS: 100.0000 mg | ORAL_TABLET | Freq: Three times a day (TID) | ORAL | Status: DC

## 2022-01-11 MED ORDER — IBUPROFEN 400 MG PO TABS: 400.0000 mg | ORAL_TABLET | Freq: Four times a day (QID) | ORAL | Status: DC | PRN

## 2022-01-11 MED ORDER — OSELTAMIVIR PHOSPHATE 75 MG PO CAPS: 75.0000 mg | ORAL_CAPSULE | Freq: Two times a day (BID) | ORAL | Status: DC

## 2022-01-11 MED ORDER — LORAZEPAM 2 MG/ML IJ SOLN: 1.0000 mg | INTRAMUSCULAR | Status: DC | PRN

## 2022-01-11 MED ORDER — FENTANYL CITRATE PF 50 MCG/ML IJ SOSY
50.0000 ug | PREFILLED_SYRINGE | Freq: Once | INTRAMUSCULAR | Status: AC
  Administered 2022-01-11: 50 ug via INTRAVENOUS
  Filled 2022-01-11: qty 1

## 2022-01-11 MED ORDER — HYDRALAZINE HCL 20 MG/ML IJ SOLN: 5.0000 mg | INTRAMUSCULAR | Status: DC | PRN

## 2022-01-11 MED ORDER — THIAMINE HCL 100 MG/ML IJ SOLN: 100.0000 mg | Freq: Every day | INTRAMUSCULAR | Status: DC

## 2022-01-11 MED ORDER — CITALOPRAM HYDROBROMIDE 20 MG PO TABS
10.0000 mg | ORAL_TABLET | Freq: Every day | ORAL | Status: DC
  Administered 2022-01-11 – 2022-01-13 (×3): 10 mg via ORAL
  Filled 2022-01-11 (×3): qty 1

## 2022-01-11 MED ORDER — HYDRALAZINE HCL 20 MG/ML IJ SOLN
10.0000 mg | Freq: Once | INTRAMUSCULAR | Status: AC
  Administered 2022-01-11: 10 mg via INTRAVENOUS
  Filled 2022-01-11: qty 1

## 2022-01-11 MED ORDER — ADULT MULTIVITAMIN W/MINERALS CH
1.0000 | ORAL_TABLET | Freq: Every day | ORAL | Status: DC
  Administered 2022-01-11 – 2022-01-13 (×3): 1 via ORAL
  Filled 2022-01-11 (×3): qty 1

## 2022-01-11 MED ORDER — ACETAMINOPHEN 325 MG PO TABS
650.0000 mg | ORAL_TABLET | Freq: Four times a day (QID) | ORAL | Status: DC | PRN
  Administered 2022-01-11 – 2022-01-12 (×4): 650 mg via ORAL
  Filled 2022-01-11 (×5): qty 2

## 2022-01-11 NOTE — ED Notes (Signed)
RN increased pt O2 [place] to 5 L due to saturation of 87% on previous flow rate. Pt denies SOB/change in LOC. MD notified.

## 2022-01-11 NOTE — ED Provider Notes (Signed)
[facility] Provider Note    Event Date/Time   First MD Initiated Contact with Patient 01/11/22 0423     (approximate)   History   Abdominal Pain   HPI  [name] is a 56 y.o. female brought to the ED via EMS from home with a chief complaint of abdominal pain, nausea/vomiting/diarrhea, cough, congestion and bodyaches.  Symptoms began yesterday afternoon.  Feels like she has the flu.  History of CHF and COPD on baseline 2 L nasal cannula oxygen at home.  Unable to take her medications due to vomiting.  Denies chest pain, shortness of breath, dysuria.     Past Medical History   Past Medical History:  Diagnosis Date   A-fib [facility]) 2010   Adrenal adenoma, left 10/06/2004   a.) CT 10/06/2004 --> measured 3.1 x 1.6 cm   Alcohol abuse    Allergy    Anginal pain (HCC)    Anxiety    Cardiac murmur    CHF (congestive heart failure) (HCC)    COPD (chronic obstructive pulmonary disease) (HCC)    Diverticulosis    Fatty liver    GERD (gastroesophageal reflux disease)    Gout    History of cocaine abuse ([place])    a.) reports that use discontinued following CVA in 2010.   History of kidney stones    Hypertension    Mild cognitive impairment    Non-healing non-surgical wound    right leg   Obesity    On supplemental oxygen therapy    a.) 2 L/[place]   OSA on CPAP    Pulmonary embolus ([place]) 02/19/2012   a.) CTA --> RIGHT upper lobe.   Stroke [facility]) 2010   Thoracic ascending aortic aneurysm ([place])    a.) CT 10/28/2019 --> measured 4.6 cm.   Thyroid disease    Tricuspid valvular regurgitation    a.) TTE 03/26/2018 --> mild to moderate.     Active Problem List   Patient Active Problem List   Diagnosis Date Noted   Chronic pain of right knee 04/15/2021   Anxiety 03/03/2021   OSA on CPAP 04/21/2020   Thoracic ascending aortic aneurysm ([place]) 03/03/2020   Hearing loss due to cerumen impaction, left 07/23/2019   Lymphedema 12/06/2018   IFG (impaired  fasting glucose) 11/03/2018   Hallucinations, visual 07/03/2018   Unstable angina (HCC) 03/25/2018   Gout 05/09/2017   COPD (chronic obstructive pulmonary disease) ([place]) 04/10/2017   History of pulmonary embolism 04/10/2017   History of thyroid disease 04/10/2017   Hyperlipidemia 12/31/2016   Essential hypertension 05/26/2016   Chronic diastolic heart failure ([place]) 05/26/2016   Adrenal adenoma 09/28/2012   Esophageal reflux 12/23/2008     Past Surgical History   Past Surgical History:  Procedure Laterality Date   CYSTOSCOPY WITH STENT PLACEMENT Left 10/02/2020   Procedure: CYSTOSCOPY WITH STENT PLACEMENT;  Surgeon: Billey Co, MD;  Location: [facility] ORS;  Service: Urology;  Laterality: Left;   CYSTOSCOPY/URETEROSCOPY/HOLMIUM LASER/STENT PLACEMENT Left 10/24/2020   Procedure: CYSTOSCOPY/URETEROSCOPY/HOLMIUM LASER/STENT EXCHANGE;  Surgeon: Billey Co, MD;  Location: [facility] ORS;  Service: Urology;  Laterality: Left;   LEFT HEART CATH AND CORONARY ANGIOGRAPHY N/A 03/27/2018   Procedure: LEFT HEART CATH AND CORONARY ANGIOGRAPHY;  Surgeon: Isaias Cowman, MD;  Location: [place] CV LAB;  Service: Cardiovascular;  Laterality: N/A;   ORIF ANKLE FRACTURE BIMALLEOLAR Left 09/20/2001   ORIF WRIST FRACTURE Bilateral 2010     Home Medications   Prior to  Admission medications   Medication Sig Start Date End Date Taking? Authorizing Provider  allopurinol (ZYLOPRIM) 300 MG tablet Take 1 tablet (300 mg total) by mouth daily. 10/16/21   Jon Billings, NP  amLODipine (NORVASC) 10 MG tablet Take 1 tablet (10 mg total) by mouth daily. 10/14/21   Alisa Graff, FNP  aspirin EC 81 MG tablet Take 1 tablet (81 mg total) by mouth daily. 10/08/20   Jon Billings, NP  atorvastatin (LIPITOR) 40 MG tablet Take 1 tablet (40 mg total) by mouth daily. 10/14/21   Alisa Graff, FNP  Cholecalciferol (VITAMIN D3) 25 MCG (1000 UT) CAPS Take 1 capsule (1,000 Units total) by mouth daily.  10/16/21   Jon Billings, NP  citalopram (CELEXA) 10 MG tablet Take 1 tablet (10 mg total) by mouth daily. 10/16/21   Jon Billings, NP  cloNIDine (CATAPRES) 0.1 MG tablet Take 1 tablet (0.1 mg total) by mouth daily. 10/14/21   Alisa Graff, FNP  colchicine 0.6 MG tablet TAKE ONE (1) TABLET BY MOUTH TWICE DAILY AS NEEDED FOR GOUT FLARES 07/13/21   Jon Billings, NP  furosemide (LASIX) 20 MG tablet Take 1 tablet (20 mg total) by mouth daily. 10/14/21   Alisa Graff, FNP  gabapentin (NEURONTIN) 300 MG capsule Take 2 capsules (600 mg total) by mouth at bedtime. 600 mg qhs 11/03/21   Jon Billings, NP  hydrALAZINE (APRESOLINE) 100 MG tablet Take 1 tablet (100 mg total) by mouth 3 (three) times daily. 11/03/21   Jon Billings, NP  lisinopril (ZESTRIL) 40 MG tablet Take 1 tablet (40 mg total) by mouth daily. 10/14/21   Alisa Graff, FNP  ondansetron (ZOFRAN-ODT) 4 MG disintegrating tablet Take 1 tablet (4 mg total) by mouth every 6 (six) hours as needed for nausea or vomiting. 11/06/21   Ward, Delice Bison, DO  pantoprazole (PROTONIX) 40 MG tablet Take 1 tablet (40 mg total) by mouth daily. 11/03/21   Jon Billings, NP  potassium chloride (KLOR-CON) 20 MEQ packet Take 20 mEq by mouth daily. 10/14/21   Alisa Graff, FNP  PROAIR HFA 108 [phone] Base) MCG/ACT inhaler INHALE TWO (2) PUFFS BY MOUTH EVERY 6 HOURS AS NEEDED FOR WHEEZING OR FOR SHORTNESS OF BREATH 10/08/20   Jon Billings, NP  tiotropium (SPIRIVA HANDIHALER) 18 MCG inhalation capsule Place 1 capsule (18 mcg total) into inhaler and inhale daily. 11/03/21   Jon Billings, NP     Allergies  Morphine and related and Penicillins   Family History   Family History  Problem Relation Age of Onset   Prostate cancer Father    Rectal cancer Sister    Breast cancer Sister    Cancer Maternal Grandmother    Cancer Maternal Grandfather      Physical Exam  Triage Vital Signs: ED Triage Vitals  Enc Vitals Group      BP 01/11/22 0154 (!) 149/103     Pulse Rate 01/11/22 0154 (!) 115     Resp 01/11/22 0154 16     Temp 01/11/22 0154 98.8 F (37.1 C)     Temp Source 01/11/22 0154 Oral     SpO2 01/11/22 0154 97 %     Weight 01/11/22 0155 224 lb (101.6 kg)     Height 01/11/22 0155 '5\' 9"'$  (1.753 m)     Head Circumference --      Peak Flow --      Pain Score 01/11/22 0155 9     Pain Loc --  Pain Edu? --      Excl. in [place]? --     Updated Vital Signs: BP (!) 154/104   Pulse 92   Temp 98.4 F (36.9 C) (Oral)   Resp (!) 25   Ht '5\' 9"'$  (1.753 m)   Wt 101.6 kg   SpO2 95%   BMI 33.08 kg/m    General: Awake, mild distress.  CV:  Tachycardic.  Good peripheral perfusion.  Resp:  Normal effort.  Diminished but otherwise CTAB. Abd:  Mild diffuse tenderness to palpation without rebound or guarding.  No distention.  Other:  Supple neck without meningismus.  No petechiae.  No truncal vesicles.   ED Results / Procedures / Treatments  Labs (all labs ordered are listed, but only abnormal results are displayed) Labs Reviewed  RESP PANEL BY RT-PCR (RSV, FLU A&B, COVID)  RVPGX2 - Abnormal; Notable for the following components:      Result Value   Influenza A by PCR POSITIVE (*)    All other components within normal limits  COMPREHENSIVE METABOLIC PANEL - Abnormal; Notable for the following components:   Potassium 3.1 (*)    Glucose, Bld 101 (*)    Total Bilirubin 1.9 (*)    All other components within normal limits  CBC  LIPASE, BLOOD  URINALYSIS, ROUTINE W REFLEX MICROSCOPIC     EKG  ED ECG REPORT I, Gearldean Lomanto J, the attending physician, personally viewed and interpreted this ECG.   Date: 01/11/2022  EKG Time: 0206  Rate: 118  Rhythm: sinus tachycardia  Axis: Normal  Intervals:none  ST&T Change: Nonspecific    RADIOLOGY I have independently visualized and interpreted patient's x-ray and CT scan as well as noted the radiology interpretation:  X-ray: No acute cardiopulmonary  process  CT abdomen/pelvis: No acute findings  Official radiology report(s): CT Abdomen Pelvis W Contrast  Result Date: 01/11/2022 CLINICAL DATA:  Abdominal pain EXAM: CT ABDOMEN AND PELVIS WITH CONTRAST TECHNIQUE: Multidetector CT imaging of the abdomen and pelvis was performed using the standard protocol following bolus administration of intravenous contrast. RADIATION DOSE REDUCTION: This exam was performed according to the departmental dose-optimization program which includes automated exposure control, adjustment of the mA and/or kV according to patient size and/or use of iterative reconstruction technique. CONTRAST:  138m OMNIPAQUE IOHEXOL 300 MG/ML  SOLN COMPARISON:  10/02/2020 FINDINGS: Lower chest: No pleural fluid or airspace disease. Hepatobiliary: No suspicious enhancing liver lesions. Gallbladder appears normal. No bile duct dilatation. Pancreas: Unremarkable. No pancreatic ductal dilatation or surrounding inflammatory changes. Spleen: Normal in size without focal abnormality. Adrenals/Urinary Tract: Right adrenal gland appears normal. Left adrenal nodule measures 3 x 1.8 cm and is unchanged compared with the previous exam compatible with a benign adenoma. No follow-up imaging recommended. Left kidney cyst measures 1.7 cm. Several, bilateral too small to characterize low-density kidney lesions are also noted. No follow-up imaging recommended. 2 mm stone within inferior pole of the right kidney is identified. Within the inferior pole of the left kidney there is a 2 mm stone. No hydronephrosis identified bilaterally. No hydroureter or ureteral calculi. Urinary bladder is unremarkable. Stomach/Bowel: Small hiatal hernia. The appendix is visualized and appears normal. Scattered colonic diverticula noted without signs of acute diverticulitis. No bowel wall thickening, inflammation, or distension. Vascular/Lymphatic: Aortic atherosclerosis. No signs of abdominopelvic adenopathy. Reproductive: Uterus  and bilateral adnexa are unremarkable. Other: No free fluid or fluid collections. The no signs of pneumoperitoneum. Musculoskeletal: Remote bilateral inferior pubic rami fractures. Remote inferior endplate deformity involving the  L3 vertebral body is unchanged from previous exam. IMPRESSION: 1. No acute findings within the abdomen or pelvis. 2. Bilateral nonobstructing renal calculi. 3. Small hiatal hernia. 4. Stable left adrenal nodule compatible with a benign adenoma. No follow-up imaging recommended. 5. Remote bilateral inferior pubic rami fractures and remote inferior endplate deformity involving the L3 vertebral body. 6.  Aortic Atherosclerosis (ICD10-I70.0). Electronically Signed   By: Kerby Moors M.D.   On: 01/11/2022 05:41   DG Chest Port 1 View  Result Date: 01/11/2022 CLINICAL DATA:  56 year old female with cough, headache, abdominal pain, diarrhea, vomiting. EXAM: PORTABLE CHEST 1 VIEW COMPARISON:  CT Chest, Abdomen, and Pelvis 11/06/2021 and earlier. FINDINGS: Portable AP upright view at 0442 hours. Severe chronic medial left clavicle and bilateral rib deformities. Stable cardiomegaly and mediastinal contours. Allowing for portable technique the lungs are clear. Visualized tracheal air column is within normal limits. Paucity of bowel gas in the visible abdomen. IMPRESSION: 1.  No acute cardiopulmonary abnormality. 2. Chronic cardiomegaly, pronounced chronic bony chest wall deformities. Electronically Signed   By: Genevie Ann M.D.   On: 01/11/2022 05:36     PROCEDURES:  Critical Care performed: Yes, see critical care procedure note(s)  CRITICAL CARE Performed by: Paulette Blanch   Total critical care time: 30 minutes  Critical care time was exclusive of separately billable procedures and treating other patients.  Critical care was necessary to treat or prevent imminent or life-threatening deterioration.  Critical care was time spent personally by me on the following activities:  development of treatment plan with patient and/or surrogate as well as nursing, discussions with consultants, evaluation of patient's response to treatment, examination of patient, obtaining history from patient or surrogate, ordering and performing treatments and interventions, ordering and review of laboratory studies, ordering and review of radiographic studies, pulse oximetry and re-evaluation of patient's condition.   Marland Kitchen1-3 Lead EKG Interpretation  Performed by: Paulette Blanch, MD Authorized by: Paulette Blanch, MD     Interpretation: abnormal     ECG rate:  118   ECG rate assessment: tachycardic     Rhythm: sinus tachycardia     Ectopy: none     Conduction: normal   Comments:     Patient placed on cardiac monitor to evaluate for arrhythmias    MEDICATIONS ORDERED IN ED: Medications  potassium chloride 10 mEq in 100 mL IVPB (10 mEq Intravenous New Bag/Given 01/11/22 0514)  oseltamivir (TAMIFLU) capsule 75 mg (has no administration in time range)  ondansetron (ZOFRAN) injection 4 mg (has no administration in time range)  chlorpheniramine-HYDROcodone (TUSSIONEX) 10-8 MG/5ML suspension 5 mL (has no administration in time range)  sodium chloride 0.9 % bolus 500 mL (500 mLs Intravenous New Bag/Given 01/11/22 0438)  ondansetron (ZOFRAN) injection 4 mg (4 mg Intravenous Given 01/11/22 0508)  fentaNYL (SUBLIMAZE) injection 50 mcg (50 mcg Intravenous Given 01/11/22 0508)  hydrALAZINE (APRESOLINE) injection 10 mg (10 mg Intravenous Given 01/11/22 0551)  iohexol (OMNIPAQUE) 300 MG/ML solution 100 mL (100 mLs Intravenous Contrast Given 01/11/22 0448)     IMPRESSION / MDM / ASSESSMENT AND PLAN / ED COURSE  I reviewed the triage vital signs and the nursing notes.                             56 year old female presenting with cold symptoms, nausea/vomiting/diarrhea. Differential includes, but is not limited to, viral syndrome, bronchitis including COPD exacerbation, pneumonia, reactive airway  disease including asthma, CHF  including exacerbation with or without pulmonary/interstitial edema, pneumothorax, ACS, thoracic trauma, and pulmonary embolism. Differential diagnosis includes, but is not limited to, ovarian cyst, ovarian torsion, acute appendicitis, diverticulitis, urinary tract infection/pyelonephritis, endometriosis, bowel obstruction, colitis, renal colic, gastroenteritis, hernia, etc. I have personally reviewed patient's records and note a PCP office visit on 12/08/2021 for CHF follow-up.  Patient's presentation is most consistent with acute presentation with potential threat to life or bodily function.  The patient is on the cardiac monitor to evaluate for evidence of arrhythmia and/or significant heart rate changes.  Laboratory results demonstrate normal WBC 8.2, mild hypokalemia potassium 3.1, patient is influenza A positive.  Will obtain chest x-ray, CT abdomen/pelvis.  Administer IV fentanyl for pain and Zofran for nausea/vomiting.  Initiate gentle IV fluid hydration and IV potassium replacement.  Administer IV hydralazine for elevated blood pressure.  Clinical Course as of 01/11/22 0553  Mon Jan 11, 2022  0548 Updated patient on unremarkable chest x-ray and CT scan.  Still complains of persistent nausea, will redose Zofran.  Blood pressure climbing up, will give IV hydralazine.  Will start Tamiflu and consult hospitalist services for evaluation and admission. [JS]    Clinical Course User Index [JS] Paulette Blanch, MD     FINAL CLINICAL IMPRESSION(S) / ED DIAGNOSES   Final diagnoses:  Generalized abdominal pain  Diarrhea, unspecified type  Nausea and vomiting, unspecified vomiting type  Influenza A  Hypertension, unspecified type  Hypokalemia     Rx / DC Orders   ED Discharge Orders     None        Note:  This document was prepared using Dragon voice recognition software and may include unintentional dictation errors.   Paulette Blanch, MD 01/11/22  [phone]

## 2022-01-11 NOTE — ED Notes (Signed)
Patient transported to CT 

## 2022-01-11 NOTE — Progress Notes (Signed)
PHARMACIST - PHYSICIAN COMMUNICATION  CONCERNING:  Enoxaparin (Lovenox) for DVT Prophylaxis    RECOMMENDATION: Patient was prescribed enoxaprin '40mg'$  q24 hours for VTE prophylaxis.   Filed Weights   01/11/22 0155  Weight: 101.6 kg (224 lb)    Body mass index is 33.08 kg/m.  Estimated Creatinine Clearance: 89.6 mL/min (by C-G formula based on SCr of 0.9 mg/dL).   Based on [place] patient is candidate for enoxaparin 0.'5mg'$ /kg TBW SQ every 24 hours based on BMI being >30.  DESCRIPTION: Pharmacy has adjusted enoxaparin dose per [facility] policy.  Patient is now receiving enoxaparin 0.5 mg/kg every 24 hours   Renda Rolls, PharmD, [facility] 01/11/2022 6:11 AM

## 2022-01-11 NOTE — ED Notes (Signed)
Report given to Tammy, RN

## 2022-01-11 NOTE — H&P (Addendum)
History and Physical    [name] JXB:[id number] DOB: 1965/10/27 DOA: 01/11/2022  Referring MD/NP/PA:   PCP: Jon Billings, NP   Patient coming from:  The patient is coming from home.  At baseline, pt is independent for most of ADL.        Chief Complaint: Cough, shortness of breath  HPI: [name] is a 56 y.o. female with medical history significant of hypertension, hyperlipidemia, COPD on 2 L oxygen, stroke, gout, depression with anxiety, diastolic CHF, PE not on anticoagulants, OSA on CPAP, mild cognitive impairment, cocaine abuse, alcohol abuse, atrial fibrillation not on anticoagulants, who presents with cough, shortness breath.  Patient states that she has been sick for more than 2 days.  Her symptoms include productive cough with greenish colored sputum, shortness of breath, fever, chills, malaise, sore throat, body aches.  Denies chest pain. Patient also reports nausea, vomiting, diarrhea and abdominal pain.  Patient has had multiple episodes of nonbilious nonbloody vomiting, and several episodes of loose stool diarrhea.  She has crampy abdominal pain, which is constant, moderate, not radiating.  Denies symptoms of UTI.  Data reviewed independently and ED Course: pt was found to have positive flu A, WBC 8.2, BNP 76, lactic acid 0.8, potassium 3.1, GFR> 60.  Temperature 103, heart rate 119, blood pressure 161/103, RR 31, oxygen saturation 97% on home level 2 L oxygen.  Chest x-ray showed cardiomegaly without infiltration.  CT of abdomen/pelvis is negative for acute intra-abdominal issues.  Patient is admitted to telemetry bed as inpatient.  CT-abd/pelvis 1. No acute findings within the abdomen or pelvis. 2. Bilateral nonobstructing renal calculi. 3. Small hiatal hernia. 4. Stable left adrenal nodule compatible with a benign adenoma. No follow-up imaging recommended. 5. Remote bilateral inferior pubic rami fractures and remote inferior endplate deformity involving the  L3 vertebral body. 6.  Aortic Atherosclerosis (ICD10-I70.0).  CTA: 1. No evidence for acute pulmonary embolus. 2. Subsegmental atelectasis/consolidation is identified within the right lung base. Correlate for any clinical signs/symptoms of right lower lobe pneumonia. 3. Coronary artery calcifications. 4. Unchanged appearance of multiple compression deformities within the thoracic spine, most severe at the T7 level. 5. Similar appearance of chronic left proximal clavicle fracture and bilateral rib fractures. 6.  Aortic Atherosclerosis (ICD10-I70.0).   EKG: I have personally reviewed.  Sinus rhythm, PVC, QTc 454, LAD, early R wave progression   Review of Systems:   General: Has fevers, chills, no body weight gain, has poor appetite, has fatigue HEENT: no blurry vision, hearing changes or sore throat Respiratory: has dyspnea, coughing, no wheezing CV: no chest pain, no palpitations GI: has nausea, vomiting, abdominal pain, diarrhea, no constipation GU: no dysuria, burning on urination, increased urinary frequency, hematuria  Ext: no leg edema Neuro: no unilateral weakness, numbness, or tingling, no vision change or hearing loss Skin: no rash, no skin tear. MSK: No muscle spasm, no deformity, no limitation of range of movement in spin Heme: No easy bruising.  Travel history: No recent long distant travel.   Allergy:  Allergies  Allergen Reactions   Morphine And Related Hives   Penicillins Other (See Comments)    Painful urination Has patient had a PCN reaction causing immediate rash, facial/tongue/throat swelling, SOB or lightheadedness with hypotension: yes Has patient had a PCN reaction causing severe rash involving mucus membranes or skin necrosis: no Has patient had a PCN reaction that required hospitalization no Has patient had a PCN reaction occurring within the last 10 years: no If all  of the above answers are "NO", then may proceed with Cephalosporin use.     Past  Medical History:  Diagnosis Date   A-fib [facility]) 2010   Adrenal adenoma, left 10/06/2004   a.) CT 10/06/2004 --> measured 3.1 x 1.6 cm   Alcohol abuse    Allergy    Anginal pain (HCC)    Anxiety    Cardiac murmur    CHF (congestive heart failure) (HCC)    COPD (chronic obstructive pulmonary disease) (HCC)    Diverticulosis    Fatty liver    GERD (gastroesophageal reflux disease)    Gout    History of cocaine abuse ([place])    a.) reports that use discontinued following CVA in 2010.   History of kidney stones    Hypertension    Mild cognitive impairment    Non-healing non-surgical wound    right leg   Obesity    On supplemental oxygen therapy    a.) 2 L/[place]   OSA on CPAP    Pulmonary embolus ([place]) 02/19/2012   a.) CTA --> RIGHT upper lobe.   Stroke [facility]) 2010   Thoracic ascending aortic aneurysm ([place])    a.) CT 10/28/2019 --> measured 4.6 cm.   Thyroid disease    Tricuspid valvular regurgitation    a.) TTE 03/26/2018 --> mild to moderate.    Past Surgical History:  Procedure Laterality Date   CYSTOSCOPY WITH STENT PLACEMENT Left 10/02/2020   Procedure: CYSTOSCOPY WITH STENT PLACEMENT;  Surgeon: Billey Co, MD;  Location: [facility] ORS;  Service: Urology;  Laterality: Left;   CYSTOSCOPY/URETEROSCOPY/HOLMIUM LASER/STENT PLACEMENT Left 10/24/2020   Procedure: CYSTOSCOPY/URETEROSCOPY/HOLMIUM LASER/STENT EXCHANGE;  Surgeon: Billey Co, MD;  Location: [facility] ORS;  Service: Urology;  Laterality: Left;   LEFT HEART CATH AND CORONARY ANGIOGRAPHY N/A 03/27/2018   Procedure: LEFT HEART CATH AND CORONARY ANGIOGRAPHY;  Surgeon: Isaias Cowman, MD;  Location: [facility];  Service: Cardiovascular;  Laterality: N/A;   ORIF ANKLE FRACTURE BIMALLEOLAR Left 09/20/2001   ORIF WRIST FRACTURE Bilateral 2010    Social History:  reports that she quit smoking about 5 years ago. Her smoking use included cigarettes. She has never used smokeless tobacco. She reports current alcohol  use. She reports that she does not use drugs.  Family History:  Family History  Problem Relation Age of Onset   Prostate cancer Father    Rectal cancer Sister    Breast cancer Sister    Cancer Maternal Grandmother    Cancer Maternal Grandfather      Prior to Admission medications   Medication Sig Start Date End Date Taking? Authorizing Provider  allopurinol (ZYLOPRIM) 300 MG tablet Take 1 tablet (300 mg total) by mouth daily. 10/16/21  Yes Jon Billings, NP  amLODipine (NORVASC) 10 MG tablet Take 1 tablet (10 mg total) by mouth daily. 10/14/21  Yes Darylene Price A, FNP  aspirin EC 81 MG tablet Take 1 tablet (81 mg total) by mouth daily. 10/08/20  Yes Jon Billings, NP  atorvastatin (LIPITOR) 40 MG tablet Take 1 tablet (40 mg total) by mouth daily. 10/14/21  Yes Hackney, Otila Kluver A, FNP  Cholecalciferol (VITAMIN D3) 25 MCG (1000 UT) CAPS Take 1 capsule (1,000 Units total) by mouth daily. 10/16/21  Yes Jon Billings, NP  cloNIDine (CATAPRES) 0.1 MG tablet Take 1 tablet (0.1 mg total) by mouth daily. 10/14/21  Yes Hackney, Otila Kluver A, FNP  colchicine 0.6 MG tablet TAKE ONE (1) TABLET BY MOUTH TWICE DAILY AS NEEDED FOR  GOUT FLARES 07/13/21  Yes Jon Billings, NP  gabapentin (NEURONTIN) 300 MG capsule Take 2 capsules (600 mg total) by mouth at bedtime. 600 mg qhs 11/03/21  Yes Jon Billings, NP  hydrALAZINE (APRESOLINE) 100 MG tablet Take 1 tablet (100 mg total) by mouth 3 (three) times daily. 11/03/21  Yes Jon Billings, NP  lisinopril (ZESTRIL) 40 MG tablet Take 1 tablet (40 mg total) by mouth daily. 10/14/21  Yes Hackney, Tina A, FNP  pantoprazole (PROTONIX) 40 MG tablet Take 1 tablet (40 mg total) by mouth daily. 11/03/21  Yes Jon Billings, NP  potassium chloride (KLOR-CON) 20 MEQ packet Take 20 mEq by mouth daily. 10/14/21  Yes Alisa Graff, FNP  PROAIR HFA 108 (90 Base) MCG/ACT inhaler INHALE TWO (2) PUFFS BY MOUTH EVERY 6 HOURS AS NEEDED FOR WHEEZING OR FOR SHORTNESS OF  BREATH 10/08/20  Yes Jon Billings, NP  tiotropium (SPIRIVA HANDIHALER) 18 MCG inhalation capsule Place 1 capsule (18 mcg total) into inhaler and inhale daily. 11/03/21  Yes Jon Billings, NP  citalopram (CELEXA) 10 MG tablet Take 1 tablet (10 mg total) by mouth daily. Patient not taking: Reported on 01/11/2022 10/16/21   Jon Billings, NP  furosemide (LASIX) 20 MG tablet Take 1 tablet (20 mg total) by mouth daily. Patient not taking: Reported on 01/11/2022 10/14/21   Darylene Price A, FNP  ondansetron (ZOFRAN-ODT) 4 MG disintegrating tablet Take 1 tablet (4 mg total) by mouth every 6 (six) hours as needed for nausea or vomiting. Patient not taking: Reported on 01/11/2022 11/06/21   Ward, Delice Bison, DO    Physical Exam: Vitals:   01/11/22 1115 01/11/22 1145 01/11/22 1208 01/11/22 1318  BP: 113/75 114/76 (!) 110/92   Pulse: 99 92 (!) 106   Resp:      Temp:    98.9 F (37.2 C)  TempSrc:    Oral  SpO2: 96% 91%    Weight:      Height:       General: Not in acute distress HEENT:       Eyes: PERRL, EOMI, no scleral icterus.       ENT: No discharge from the ears and nose, no pharynx injection, no tonsillar enlargement.        Neck: No JVD, no bruit, no mass felt. Heme: No neck lymph node enlargement. Cardiac: S1/S2, RRR, No murmurs, No gallops or rubs. Respiratory: Has decreased air movement GI: Soft, nondistended, has diffused abdominal tenderness,  no rebound pain, no organomegaly, BS present. GU: No hematuria Ext: No pitting leg edema bilaterally. 1+DP/PT pulse bilaterally. Musculoskeletal: No joint deformities, No joint redness or warmth, no limitation of ROM in spin. Skin: No rashes.  Neuro: Alert, oriented X3, cranial nerves II-XII grossly intact, moves all extremities normally.  Psych: Patient is not psychotic, no suicidal or hemocidal ideation.  Labs on Admission: I have personally reviewed following labs and imaging studies  CBC: Recent Labs  Lab 01/11/22 0158   WBC 8.2  HGB 13.0  HCT 40.7  MCV 95.8  PLT 643   Basic Metabolic Panel: Recent Labs  Lab 01/11/22 0327  NA 139  K 3.1*  CL 101  CO2 28  GLUCOSE 101*  BUN 11  CREATININE 0.90  CALCIUM 9.2  MG 1.9   GFR: Estimated Creatinine Clearance: 89.6 mL/min (by C-G formula based on SCr of 0.9 mg/dL). Liver Function Tests: Recent Labs  Lab 01/11/22 0327  AST 19  ALT 10  ALKPHOS 68  BILITOT 1.9*  PROT  7.7  ALBUMIN 3.9   Recent Labs  Lab 01/11/22 0327  LIPASE 31   No results for input(s): "AMMONIA" in the last 168 hours. Coagulation Profile: No results for input(s): "INR", "PROTIME" in the last 168 hours. Cardiac Enzymes: No results for input(s): "CKTOTAL", "CKMB", "CKMBINDEX", "TROPONINI" in the last 168 hours. BNP (last 3 results) No results for input(s): "PROBNP" in the last 8760 hours. HbA1C: No results for input(s): "HGBA1C" in the last 72 hours. CBG: No results for input(s): "GLUCAP" in the last 168 hours. Lipid Profile: No results for input(s): "CHOL", "HDL", "LDLCALC", "TRIG", "CHOLHDL", "LDLDIRECT" in the last 72 hours. Thyroid Function Tests: No results for input(s): "TSH", "T4TOTAL", "FREET4", "T3FREE", "THYROIDAB" in the last 72 hours. Anemia Panel: No results for input(s): "VITAMINB12", "FOLATE", "FERRITIN", "TIBC", "IRON", "RETICCTPCT" in the last 72 hours. Urine analysis:    Component Value Date/Time   COLORURINE YELLOW (A) 01/11/2022 0600   APPEARANCEUR CLEAR (A) 01/11/2022 0600   APPEARANCEUR Clear 10/16/2021 0844   LABSPEC >1.046 (H) 01/11/2022 0600   LABSPEC 1.013 01/14/2014 0117   PHURINE 6.0 01/11/2022 0600   GLUCOSEU NEGATIVE 01/11/2022 0600   GLUCOSEU Negative 01/14/2014 0117   HGBUR SMALL (A) 01/11/2022 0600   BILIRUBINUR NEGATIVE 01/11/2022 0600   BILIRUBINUR Negative 10/16/2021 0844   BILIRUBINUR Negative 01/14/2014 0117   KETONESUR NEGATIVE 01/11/2022 0600   PROTEINUR 30 (A) 01/11/2022 0600   UROBILINOGEN 1.0 02/05/2009 2222    NITRITE NEGATIVE 01/11/2022 0600   LEUKOCYTESUR SMALL (A) 01/11/2022 0600   LEUKOCYTESUR Negative 01/14/2014 0117   Sepsis Labs: '@LABRCNTIP'$ (procalcitonin:4,lacticidven:4) ) Recent Results (from the past 240 hour(s))  Resp panel by RT-PCR (RSV, Flu A&B, Covid) Anterior Nasal Swab     Status: Abnormal   Collection Time: 01/11/22  1:58 AM   Specimen: Anterior Nasal Swab  Result Value Ref Range Status   SARS Coronavirus 2 by RT PCR NEGATIVE NEGATIVE Final    Comment: (NOTE) SARS-CoV-2 target nucleic acids are NOT DETECTED.  The SARS-CoV-2 RNA is generally detectable in upper respiratory specimens during the acute phase of infection. The lowest concentration of SARS-CoV-2 viral copies this assay can detect is 138 copies/mL. A negative result does not preclude SARS-Cov-2 infection and should not be used as the sole basis for treatment or other patient management decisions. A negative result may occur with  improper specimen collection/handling, submission of specimen other than nasopharyngeal swab, presence of viral mutation(s) within the areas targeted by this assay, and inadequate number of viral copies(<138 copies/mL). A negative result must be combined with clinical observations, patient history, and epidemiological information. The expected result is Negative.  Fact Sheet for Patients:  EntrepreneurPulse.com.au  Fact Sheet for Healthcare Providers:  IncredibleEmployment.be  This test is no t yet approved or cleared by the [place] FDA and  has been authorized for detection and/or diagnosis of SARS-CoV-2 by FDA under an Emergency Use Authorization (EUA). This EUA will remain  in effect (meaning this test can be used) for the duration of the COVID-19 declaration under Section 564(b)(1) of the Act, 21 U.S.C.section 360bbb-3(b)(1), unless the authorization is terminated  or revoked sooner.       Influenza A by PCR POSITIVE (A) NEGATIVE  Final   Influenza B by PCR NEGATIVE NEGATIVE Final    Comment: (NOTE) The Xpert Xpress SARS-CoV-2/FLU/RSV plus assay is intended as an aid in the diagnosis of influenza from Nasopharyngeal swab specimens and should not be used as a sole basis for treatment. Nasal washings and aspirates are  unacceptable for Xpert Xpress SARS-CoV-2/FLU/RSV testing.  Fact Sheet for Patients: EntrepreneurPulse.com.au  Fact Sheet for Healthcare Providers: IncredibleEmployment.be  This test is not yet approved or cleared by the [place] FDA and has been authorized for detection and/or diagnosis of SARS-CoV-2 by FDA under an Emergency Use Authorization (EUA). This EUA will remain in effect (meaning this test can be used) for the duration of the COVID-19 declaration under Section 564(b)(1) of the Act, 21 U.S.C. section 360bbb-3(b)(1), unless the authorization is terminated or revoked.     Resp Syncytial Virus by PCR NEGATIVE NEGATIVE Final    Comment: (NOTE) Fact Sheet for Patients: EntrepreneurPulse.com.au  Fact Sheet for Healthcare Providers: IncredibleEmployment.be  This test is not yet approved or cleared by the [place] FDA and has been authorized for detection and/or diagnosis of SARS-CoV-2 by FDA under an Emergency Use Authorization (EUA). This EUA will remain in effect (meaning this test can be used) for the duration of the COVID-19 declaration under Section 564(b)(1) of the Act, 21 U.S.C. section 360bbb-3(b)(1), unless the authorization is terminated or revoked.  Performed at [facility], [address], [place], [place] [address]      Radiological Exams on Admission: CT Angio Chest Pulmonary Embolism (PE) W or WO Contrast  Result Date: 01/11/2022 CLINICAL DATA:  Rule out pulmonary embolism. EXAM: CT ANGIOGRAPHY CHEST WITH CONTRAST TECHNIQUE: Multidetector CT imaging of the chest was performed  using the standard protocol during bolus administration of intravenous contrast. Multiplanar CT image reconstructions and MIPs were obtained to evaluate the vascular anatomy. RADIATION DOSE REDUCTION: This exam was performed according to the departmental dose-optimization program which includes automated exposure control, adjustment of the mA and/or kV according to patient size and/or use of iterative reconstruction technique. CONTRAST:  87m OMNIPAQUE IOHEXOL 350 MG/ML SOLN COMPARISON:  11/06/2021 FINDINGS: Cardiovascular: Satisfactory opacification of the pulmonary arteries to the segmental level. No evidence of pulmonary embolism. Normal heart size. No pericardial effusion. Ascending thoracic aorta is mildly dilated measuring 4.3 cm, image 278/100. Aortic atherosclerotic calcifications and coronary artery calcifications noted. Mediastinum/Nodes: No enlarged mediastinal, hilar, or axillary lymph nodes. Thyroid gland, trachea, and esophagus demonstrate no significant findings. Lungs/Pleura: No pleural fluid. Subsegmental atelectasis/consolidation is identified within the right lung base, image 99/5. Multiple scattered areas of pleural thickening adjacent to remote healed rib fractures. Upper Abdomen: Chronic deformity involving the left clavicle at the sternal clavicular joint is again noted, image 23/6. Musculoskeletal: Numerous bilateral remote healed rib fracture deformities are again seen. Unchanged appearance of multiple compression deformities within the thoracic spine, most severe at the T7 level. Stable posttraumatic deformity involving the left proximal clavicle. Review of the MIP images confirms the above findings. IMPRESSION: 1. No evidence for acute pulmonary embolus. 2. Subsegmental atelectasis/consolidation is identified within the right lung base. Correlate for any clinical signs/symptoms of right lower lobe pneumonia. 3. Coronary artery calcifications. 4. Unchanged appearance of multiple compression  deformities within the thoracic spine, most severe at the T7 level. 5. Similar appearance of chronic left proximal clavicle fracture and bilateral rib fractures. 6.  Aortic Atherosclerosis (ICD10-I70.0). Electronically Signed   By: TKerby MoorsM.D.   On: 01/11/2022 12:12   CT Abdomen Pelvis W Contrast  Result Date: 01/11/2022 CLINICAL DATA:  Abdominal pain EXAM: CT ABDOMEN AND PELVIS WITH CONTRAST TECHNIQUE: Multidetector CT imaging of the abdomen and pelvis was performed using the standard protocol following bolus administration of intravenous contrast. RADIATION DOSE REDUCTION: This exam was performed according to the departmental dose-optimization program which  includes automated exposure control, adjustment of the mA and/or kV according to patient size and/or use of iterative reconstruction technique. CONTRAST:  147m OMNIPAQUE IOHEXOL 300 MG/ML  SOLN COMPARISON:  10/02/2020 FINDINGS: Lower chest: No pleural fluid or airspace disease. Hepatobiliary: No suspicious enhancing liver lesions. Gallbladder appears normal. No bile duct dilatation. Pancreas: Unremarkable. No pancreatic ductal dilatation or surrounding inflammatory changes. Spleen: Normal in size without focal abnormality. Adrenals/Urinary Tract: Right adrenal gland appears normal. Left adrenal nodule measures 3 x 1.8 cm and is unchanged compared with the previous exam compatible with a benign adenoma. No follow-up imaging recommended. Left kidney cyst measures 1.7 cm. Several, bilateral too small to characterize low-density kidney lesions are also noted. No follow-up imaging recommended. 2 mm stone within inferior pole of the right kidney is identified. Within the inferior pole of the left kidney there is a 2 mm stone. No hydronephrosis identified bilaterally. No hydroureter or ureteral calculi. Urinary bladder is unremarkable. Stomach/Bowel: Small hiatal hernia. The appendix is visualized and appears normal. Scattered colonic diverticula noted  without signs of acute diverticulitis. No bowel wall thickening, inflammation, or distension. Vascular/Lymphatic: Aortic atherosclerosis. No signs of abdominopelvic adenopathy. Reproductive: Uterus and bilateral adnexa are unremarkable. Other: No free fluid or fluid collections. The no signs of pneumoperitoneum. Musculoskeletal: Remote bilateral inferior pubic rami fractures. Remote inferior endplate deformity involving the L3 vertebral body is unchanged from previous exam. IMPRESSION: 1. No acute findings within the abdomen or pelvis. 2. Bilateral nonobstructing renal calculi. 3. Small hiatal hernia. 4. Stable left adrenal nodule compatible with a benign adenoma. No follow-up imaging recommended. 5. Remote bilateral inferior pubic rami fractures and remote inferior endplate deformity involving the L3 vertebral body. 6.  Aortic Atherosclerosis (ICD10-I70.0). Electronically Signed   By: TKerby MoorsM.D.   On: 01/11/2022 05:41   DG Chest Port 1 View  Result Date: 01/11/2022 CLINICAL DATA:  56year old female with cough, headache, abdominal pain, diarrhea, vomiting. EXAM: PORTABLE CHEST 1 VIEW COMPARISON:  CT Chest, Abdomen, and Pelvis 11/06/2021 and earlier. FINDINGS: Portable AP upright view at 0442 hours. Severe chronic medial left clavicle and bilateral rib deformities. Stable cardiomegaly and mediastinal contours. Allowing for portable technique the lungs are clear. Visualized tracheal air column is within normal limits. Paucity of bowel gas in the visible abdomen. IMPRESSION: 1.  No acute cardiopulmonary abnormality. 2. Chronic cardiomegaly, pronounced chronic bony chest wall deformities. Electronically Signed   By: HGenevie AnnM.D.   On: 01/11/2022 05:36      Assessment/Plan Principal Problem:   Influenza A Active Problems:   Sepsis (HCC)   COPD (chronic obstructive pulmonary disease) (HCC)   Nausea vomiting and diarrhea   Essential hypertension   Chronic diastolic heart failure (HCC)    Hyperlipidemia   Gout   Hypokalemia   Stroke (HCC)   Alcohol use   Atrial fibrillation, chronic (HCC)   Depression with anxiety   OSA on CPAP   Obesity with body mass index (BMI) of 30.0 to 39.9   Assessment and Plan:  Influenza A and sepsis due to Flu A infection and hx of COPD: Patient meets criteria for sepsis with heart rate 119, RR 31 and fever of 103.  Lactic acid normal. CTA negative for PE.  Oxygen saturation 97% on home level 2 L oxygen.  Patient has productive cough with greenish colored sputum production.  Will start empiric antibiotics, Z-Pak and Tamiflu.  - will admit to tele bed as inpatient -Tamiflu -Bronchodilators -Z pak  -Mucinex for cough  -  Incentive spirometry -sputum culture -Nasal cannula oxygen as needed to maintain O2 saturation 93% or greater -will get Procalcitonin -IVF: 1.5L of NS bolus  COPD (chronic obstructive pulmonary disease) (HCC) -see above  Nausea vomiting and diarrhea and abdominal pain: CT abdomen/pelvis is negative for acute intra-abdominal issues. -Follow-up C diff and GI pathogen panel -IVF as above -prn zofran  Essential hypertension: Blood pressure 161/103.  Patient is supposed to take amlodipine, clonidine, oral hydralazine, lisinopril, and Lasix, but currently is not taking these medications due to nausea, vomiting -Hold lisinopril and oral hydralazine -hold Lasix -Restart amlodipine and clonidine -IV hydralazine as needed  Chronic diastolic heart failure ([place]): 2D echo on 03/26/2018 showed EF 60 to 65%.  Patient does not have leg edema or JVD.  BNP is normal at 26.  CHF is compensated. -Hold Lasix  Hyperlipidemia -Lipitor  Gout -Allopurinol and colchicine  Hypokalemia: Potassium 3.1, magnesium 1.9 -Repleted potassium  History of stroke ([place]): -Aspirin, Lipitor  Atrial fibrillation, chronic ([place]): Heart rate 100-120.  Patient is not taking anticoagulants. -Started on metoprolol tart 25 mg twice daily -IV metoprolol 5  mg every 2 hours as needed for heart rate> 125  Depression with anxiety -Continue home medications  OSA -on CPAP  Obesity with body mass index (BMI) of 30.0 to 39.9: Body weight 1 1.6 kg, BMI 33.08 -Healthy diet and exercise -Encourage losing weight    DVT ppx: SQ Lovenox  Code Status: Full code  Family Communication:  Yes, patient's  sister  by phone  Disposition Plan:  Anticipate discharge back to previous environment  Consults called:  None  Admission status and Level of care: Telemetry Medical:    as inpt      Dispo: The patient is from: Home              Anticipated d/c is to: Home              Anticipated d/c date is: 2 days              Patient currently is not medically stable to d/c.    Severity of Illness:  The appropriate patient status for this patient is INPATIENT. Inpatient status is judged to be reasonable and necessary in order to provide the required intensity of service to ensure the patient's safety. The patient's presenting symptoms, physical exam findings, and initial radiographic and laboratory data in the context of their chronic comorbidities is felt to place them at high risk for further clinical deterioration. Furthermore, it is not anticipated that the patient will be medically stable for discharge from the hospital within 2 midnights of admission.   * I certify that at the point of admission it is my clinical judgment that the patient will require inpatient hospital care spanning beyond 2 midnights from the point of admission due to high intensity of service, high risk for further deterioration and high frequency of surveillance required.*       Date of Service 01/11/2022    [facility]   If 7PM-7AM, please contact night-coverage [email or web address] 01/11/2022, 1:36 PM

## 2022-01-11 NOTE — ED Triage Notes (Signed)
Pt BIB [facility] from home for abdominal pain with N/V/D. Started at 530pm yesterday afternoon. Sharp pains all over.   '4MG'$  zofran 20G RFA ST on EKG 96%% on 2 liters COPD  168/118 Hasn't taken her BP meds.   Pt isCAOx4 and in no acute distress.

## 2022-01-11 NOTE — ED Notes (Signed)
Patient reports abdominal pain, headache, vomiting, diarrhea, body aches, cough since yesterday.

## 2022-01-11 NOTE — ED Notes (Signed)
RN notified NP Foust of decrease in pt O2 saturation while sleeping.

## 2022-01-11 NOTE — ED Notes (Signed)
Pt refused meal due to desire to sleep and woke with nausea/vomiting causing her to continue refusal.

## 2022-01-12 DIAGNOSIS — A419 Sepsis, unspecified organism: Secondary | ICD-10-CM | POA: Diagnosis not present

## 2022-01-12 DIAGNOSIS — J449 Chronic obstructive pulmonary disease, unspecified: Secondary | ICD-10-CM

## 2022-01-12 DIAGNOSIS — I10 Essential (primary) hypertension: Secondary | ICD-10-CM

## 2022-01-12 DIAGNOSIS — R112 Nausea with vomiting, unspecified: Secondary | ICD-10-CM | POA: Diagnosis not present

## 2022-01-12 DIAGNOSIS — I482 Chronic atrial fibrillation, unspecified: Secondary | ICD-10-CM

## 2022-01-12 DIAGNOSIS — J101 Influenza due to other identified influenza virus with other respiratory manifestations: Secondary | ICD-10-CM | POA: Diagnosis not present

## 2022-01-12 LAB — GASTROINTESTINAL PANEL BY PCR, STOOL (REPLACES STOOL CULTURE)

## 2022-01-12 LAB — CBC
HCT: 39.2 % (ref 36.0–46.0)
Hemoglobin: 12.1 g/dL (ref 12.0–15.0)
MCH: 29.9 pg (ref 26.0–34.0)
MCHC: 30.9 g/dL (ref 30.0–36.0)
MCV: 96.8 fL (ref 80.0–100.0)
Platelets: 155 10*3/uL (ref 150–400)
RBC: 4.05 MIL/uL (ref 3.87–5.11)
RDW: 14.2 % (ref 11.5–15.5)
WBC: 5.6 10*3/uL (ref 4.0–10.5)
nRBC: 0 % (ref 0.0–0.2)

## 2022-01-12 LAB — BASIC METABOLIC PANEL
Anion gap: 6 (ref 5–15)
BUN: 15 mg/dL (ref 6–20)
CO2: 28 mmol/L (ref 22–32)
Calcium: 8.2 mg/dL — ABNORMAL LOW (ref 8.9–10.3)
Chloride: 106 mmol/L (ref 98–111)
Creatinine, Ser: 0.84 mg/dL (ref 0.44–1.00)
GFR, Estimated: >60 mL/min (ref >60)
Glucose, Bld: 80 mg/dL (ref 70–99)
Potassium: 3.2 mmol/L — ABNORMAL LOW (ref 3.5–5.1)
Sodium: 140 mmol/L (ref 135–145)

## 2022-01-12 LAB — C DIFFICILE QUICK SCREEN W PCR REFLEX
C Diff antigen: POSITIVE — AB
C Diff toxin: NEGATIVE

## 2022-01-12 LAB — GLUCOSE, CAPILLARY: Glucose-Capillary: 116 mg/dL — ABNORMAL HIGH (ref 70–99)

## 2022-01-12 LAB — MAGNESIUM: Magnesium: 2 mg/dL (ref 1.7–2.4)

## 2022-01-12 LAB — CLOSTRIDIUM DIFFICILE BY PCR, REFLEXED: Toxigenic C. Difficile by PCR: POSITIVE — AB

## 2022-01-12 MED ORDER — POTASSIUM CHLORIDE CRYS ER 20 MEQ PO TBCR
40.0000 meq | EXTENDED_RELEASE_TABLET | Freq: Once | ORAL | Status: AC
  Administered 2022-01-12: 40 meq via ORAL
  Filled 2022-01-12: qty 2

## 2022-01-12 MED ORDER — IPRATROPIUM-ALBUTEROL 0.5-2.5 (3) MG/3ML IN SOLN
3.0000 mL | Freq: Three times a day (TID) | RESPIRATORY_TRACT | Status: DC
  Administered 2022-01-12 – 2022-01-13 (×4): 3 mL via RESPIRATORY_TRACT
  Filled 2022-01-12 (×4): qty 3

## 2022-01-12 NOTE — Assessment & Plan Note (Signed)
-   Continue home medications 

## 2022-01-12 NOTE — Hospital Course (Addendum)
Taken from H&P.   [name] is a 56 y.o. female with medical history significant of hypertension, hyperlipidemia, COPD on 2 L oxygen, stroke, gout, depression with anxiety, diastolic CHF, PE not on anticoagulants, OSA on CPAP, mild cognitive impairment, cocaine abuse, alcohol abuse, atrial fibrillation not on anticoagulants, who presents with cough, shortness breath for the past 2 days. Her symptoms include productive cough with greenish colored sputum, shortness of breath, fever, chills, malaise, sore throat, body aches. Denies chest pain.  Patient also reports nausea, vomiting, diarrhea and abdominal pain.  No urinary symptoms  Data reviewed independently and ED Course: pt was found to have positive flu A, WBC 8.2, BNP 76, lactic acid 0.8, potassium 3.1, GFR> 60.  Temperature 103, heart rate 119, blood pressure 161/103, RR 31, oxygen saturation 97% on home level 2 L oxygen.  Chest x-ray showed cardiomegaly without infiltration.  CT of abdomen/pelvis is negative for acute intra-abdominal issues.  Patient is admitted to telemetry bed as inpatient.   CT-abd/pelvis 1. No acute findings within the abdomen or pelvis. 2. Bilateral nonobstructing renal calculi. 3. Small hiatal hernia. 4. Stable left adrenal nodule compatible with a benign adenoma. No follow-up imaging recommended. 5. Remote bilateral inferior pubic rami fractures and remote inferior endplate deformity involving the L3 vertebral body. 6.  Aortic Atherosclerosis (ICD10-I70.0).   CTA: 1. No evidence for acute pulmonary embolus. 2. Subsegmental atelectasis/consolidation is identified within the right lung base. Correlate for any clinical signs/symptoms of right lower lobe pneumonia. 3. Coronary artery calcifications. 4. Unchanged appearance of multiple compression deformities within the thoracic spine, most severe at the T7 level. 5. Similar appearance of chronic left proximal clavicle fracture and bilateral rib fractures. 6.   Aortic Atherosclerosis (ICD10-I70.0).    EKG: Personally reviewed, sinus rhythm, PVC, QTc 454, LAD, early R wave progression.  Labs with potassium of 3.2, rest normal Patient was started on Tamiflu and Z-Pak.  12/19: Vitals stable, maximum temperature recorded at 101.1 yesterday morning.  Potassium at 3.2, rest of the labs stable.  BNP within normal limit, procalcitonin negative. Diarrhea improving and no abdominal pain.  GI pathogen panel negative but C. difficile antigen positive with negative toxin with reflex to PCR which came back positive.  Discussed with ID and it might be a colonization as patient does not have any leukocytosis or abdominal pain.  If she continued to have diarrhea then we will treat her.  12/20: Remained afebrile with stable vitals.  Overnight she was complaining of right upper extremity and shoulder pain which she attributes to the blood pressure cuff.  Imaging was obtained and it was without any acute abnormality. Patient was ambulated on room air and maximum desaturation was up to 89%, with a quick spontaneous recovery.  She does not qualify for home oxygen.  She was given 3 more days of Tamiflu and Zithromax.  She was also started on metoprolol for better control of her heart rate.  She is on multiple antihypertensives at home which were continued and blood pressure remained within normal limit.  Patient will continue with rest of her home medications and need to have a close follow-up with her providers for further recommendations.

## 2022-01-12 NOTE — Assessment & Plan Note (Signed)
Estimated body mass index is 35.65 kg/m as calculated from the following:   Height as of this encounter: '5\' 6"'$  (1.676 m).   Weight as of this encounter: 100.2 kg.   -This will complicate overall prognosis

## 2022-01-12 NOTE — Assessment & Plan Note (Signed)
-   CPAP at night °

## 2022-01-12 NOTE — Progress Notes (Signed)
Patient placed on CPAP for HS, with 5L O2. Patient tolerating well at this time.

## 2022-01-12 NOTE — Assessment & Plan Note (Signed)
-  Continue home allopurinol and colchicine

## 2022-01-12 NOTE — Assessment & Plan Note (Signed)
Currently rate controlled.  Patient is not on any anticoagulation. -Continue metoprolol

## 2022-01-12 NOTE — Assessment & Plan Note (Signed)
Blood pressure within goal..  On multiple medications at home. -Amlodipine and clonidine was resumed. -Holding diuretic and hydralazine -We will resume more medications as needed

## 2022-01-12 NOTE — Assessment & Plan Note (Signed)
Patient meets criteria for sepsis with heart rate 119, RR 31 and fever of 103.  Lactic acid normal. CTA negative for PE.  Procalcitonin negative.  Sepsis secondary to influenza A infection. -See management above

## 2022-01-12 NOTE — Progress Notes (Signed)
Progress Note   Patient: [name] PFX:[id number] DOB: 1965/04/16 DOA: 01/11/2022     1 DOS: the patient was seen and examined on 01/12/2022   Brief hospital course: Taken from H&P.   [name] is a 56 y.o. female with medical history significant of hypertension, hyperlipidemia, COPD on 2 L oxygen, stroke, gout, depression with anxiety, diastolic CHF, PE not on anticoagulants, OSA on CPAP, mild cognitive impairment, cocaine abuse, alcohol abuse, atrial fibrillation not on anticoagulants, who presents with cough, shortness breath for the past 2 days. Her symptoms include productive cough with greenish colored sputum, shortness of breath, fever, chills, malaise, sore throat, body aches. Denies chest pain.  Patient also reports nausea, vomiting, diarrhea and abdominal pain.  No urinary symptoms  Data reviewed independently and ED Course: pt was found to have positive flu A, WBC 8.2, BNP 76, lactic acid 0.8, potassium 3.1, GFR> 60.  Temperature 103, heart rate 119, blood pressure 161/103, RR 31, oxygen saturation 97% on home level 2 L oxygen.  Chest x-ray showed cardiomegaly without infiltration.  CT of abdomen/pelvis is negative for acute intra-abdominal issues.  Patient is admitted to telemetry bed as inpatient.   CT-abd/pelvis 1. No acute findings within the abdomen or pelvis. 2. Bilateral nonobstructing renal calculi. 3. Small hiatal hernia. 4. Stable left adrenal nodule compatible with a benign adenoma. No follow-up imaging recommended. 5. Remote bilateral inferior pubic rami fractures and remote inferior endplate deformity involving the L3 vertebral body. 6.  Aortic Atherosclerosis (ICD10-I70.0).   CTA: 1. No evidence for acute pulmonary embolus. 2. Subsegmental atelectasis/consolidation is identified within the right lung base. Correlate for any clinical signs/symptoms of right lower lobe pneumonia. 3. Coronary artery calcifications. 4. Unchanged appearance of multiple  compression deformities within the thoracic spine, most severe at the T7 level. 5. Similar appearance of chronic left proximal clavicle fracture and bilateral rib fractures. 6.  Aortic Atherosclerosis (ICD10-I70.0).    EKG: Personally reviewed, sinus rhythm, PVC, QTc 454, LAD, early R wave progression.  Labs with potassium of 3.2, rest normal Patient was started on Tamiflu and Z-Pak.  12/19: Vitals labile, maximum temperature recorded at 101.1 yesterday morning.  Potassium at 3.2, rest of the labs stable.  BNP within normal limit, procalcitonin negative. Diarrhea improving and no abdominal pain.  GI pathogen panel negative but C. difficile antigen positive with negative toxin with reflex to PCR which came back positive.  Discussed with ID and it might be a colonization as patient does not have any leukocytosis or abdominal pain.  If she continued to have diarrhea then we will treat her.  Assessment and Plan: * Influenza A Patient has productive cough with greenish colored sputum production. Influenza A PCR positive.  Currently saturating well on baseline oxygen requirement of 2 to 3 L and CPAP at night.  Procalcitonin negative She was started on Zithromax and Tamiflu. -Continue with supportive care -Incentive spirometry  Sepsis [place])  Patient meets criteria for sepsis with heart rate 119, RR 31 and fever of 103.  Lactic acid normal. CTA negative for PE.  Procalcitonin negative.  Sepsis secondary to influenza A infection. -See management above   COPD (chronic obstructive pulmonary disease) ([place]) -Please see above  Nausea vomiting and diarrhea Most likely secondary to influenza A.CT abdomen/pelvis is negative for acute intra-abdominal issues. GI pathogen panel negative but C. difficile antigen positive with negative toxin, reflex PCR positive.  Diarrhea improving with no leukocytosis or abdominal pain. -Continue to monitor-most likely colonization -If diarrhea persist  then we will treat  her for C. difficile with 10 days of p.o. vancomycin  Essential hypertension Blood pressure within goal..  On multiple medications at home. -Amlodipine and clonidine was resumed. -Holding diuretic and hydralazine -We will resume more medications as needed  Chronic diastolic heart failure ([place]) D echo on 03/26/2018 showed EF 60 to 65%.  Patient does not have leg edema or JVD.  BNP is normal at 26.  CHF is compensated. -Hold Lasix  Hyperlipidemia -Continue Lipitor  Gout -Continue home allopurinol and colchicine  Hypokalemia Potassium at 3.2 with normal magnesium -Replete potassium and monitor  Stroke ([place]) -Continue home aspirin and Lipitor  Atrial fibrillation, chronic ([facility]) Currently rate controlled.  Patient is not on any anticoagulation. -Continue metoprolol  Depression with anxiety -Continue home medications  OSA on CPAP -CPAP at night  Obesity with body mass index (BMI) of 30.0 to 39.9 Estimated body mass index is 35.65 kg/m as calculated from the following:   Height as of this encounter: '5\' 6"'$  (1.676 m).   Weight as of this encounter: 100.2 kg.   -This will complicate overall prognosis        Subjective: Patient was resting comfortably when seen today.  No more nausea and vomiting.  Diarrhea and cough improving.  Physical Exam: Vitals:   01/12/22 0538 01/12/22 0802 01/12/22 0900 01/12/22 1333  BP: 106/79 126/87 126/87   Pulse: 80 100 100   Resp: 16 18    Temp: 97.9 F (36.6 C) 98.2 F (36.8 C)    TempSrc: Oral Oral    SpO2: 97% 97%  98%  Weight: 100.2 kg     Height: '5\' 6"'$  (1.676 m)      General.  Obese lady, in no acute distress. Pulmonary.  Scant scattered wheeze bilaterally, normal respiratory effort. CV.  Regular rate and rhythm, no JVD, rub or murmur. Abdomen.  Soft, nontender, nondistended, BS positive. CNS.  Alert and oriented .  No focal neurologic deficit. Extremities.  No edema, no cyanosis, pulses intact and symmetrical. Psychiatry.   Judgment and insight appears normal.   Data Reviewed: Prior data reviewed  Family Communication: Discussed with patient  Disposition: Status is: Inpatient Remains inpatient appropriate because: Severity of illness  Planned Discharge Destination: Home   Time spent: 50 minutes  This record has been created using Systems analyst. Errors have been sought and corrected,but may not always be located. Such creation errors do not reflect on the standard of care.   Author: Lorella Nimrod, MD 01/12/2022 4:32 PM  For on call review [email or web address].

## 2022-01-12 NOTE — Assessment & Plan Note (Signed)
-  Continue Lipitor °

## 2022-01-12 NOTE — Assessment & Plan Note (Signed)
-  Continue home aspirin and Lipitor

## 2022-01-12 NOTE — Assessment & Plan Note (Signed)
Potassium at 3.2 with normal magnesium -Replete potassium and monitor

## 2022-01-12 NOTE — Assessment & Plan Note (Signed)
Patient has productive cough with greenish colored sputum production. Influenza A PCR positive.  Currently saturating well on baseline oxygen requirement of 2 to 3 L and CPAP at night.  Procalcitonin negative She was started on Zithromax and Tamiflu. -Continue with supportive care -Incentive spirometry

## 2022-01-12 NOTE — Assessment & Plan Note (Signed)
D echo on 03/26/2018 showed EF 60 to 65%.  Patient does not have leg edema or JVD.  BNP is normal at 26.  CHF is compensated. -Hold Lasix

## 2022-01-12 NOTE — Assessment & Plan Note (Signed)
Please see above

## 2022-01-12 NOTE — TOC Initial Note (Signed)
Transition of Care [facility]) - Initial/Assessment Note    Patient Details  Name: [name] MRN: [id number] Date of Birth: [birth year]  Transition of Care [facility]) CM/SW Contact:    Candie Chroman, LCSW Phone Number: 01/12/2022, 4:10 PM  Clinical Narrative:  CSW spoke with patient. PCP is Jon Billings, NP at [facility]. Patient drives herself to appointments. Pharmacy is [facility] in [place]. No issues obtaining medications. Patient lives home alone. No home health prior to admission. Patient was set up with oxygen through [employer] in October but she said it was never delivered to the home. [employer] liaison is aware and is requesting new sats test and DME orders prior to admission. Patient has a CPAP machine at home. CSW consulted for SA resources but patient declined. No further concerns. CSW encouraged patient to contact CSW as needed. CSW will continue to follow patient for support and facilitate return home once stable. Sister will transport her home at discharge.               Expected Discharge Plan: Home/Self Care Barriers to Discharge: Continued Medical Work up   Patient Goals and CMS Choice        Expected Discharge Plan and Services Expected Discharge Plan: Home/Self Care     Post Acute Care Choice: Durable Medical Equipment Living arrangements for the past 2 months: Apartment                                      Prior Living Arrangements/Services Living arrangements for the past 2 months: Apartment Lives with:: Self Patient language and need for interpreter reviewed:: Yes Do you feel safe going back to the place where you live?: Yes      Need for Family Participation in Patient Care: Yes (Comment)   Current home services: DME Criminal Activity/Legal Involvement Pertinent to Current Situation/Hospitalization: No - Comment as needed  Activities of Daily Living Home Assistive Devices/Equipment: Cane (specify quad or straight) ADL Screening (condition at  time of admission) Patient's cognitive ability adequate to safely complete daily activities?: Yes Is the patient deaf or have difficulty hearing?: No Does the patient have difficulty seeing, even when wearing glasses/contacts?: No Does the patient have difficulty concentrating, remembering, or making decisions?: No Patient able to express need for assistance with ADLs?: No Does the patient have difficulty dressing or bathing?: No Independently performs ADLs?: Yes (appropriate for developmental age) Does the patient have difficulty walking or climbing stairs?: No Weakness of Legs: None Weakness of Arms/Hands: None  Permission Sought/Granted                  Emotional Assessment Appearance:: Appears stated age Attitude/Demeanor/Rapport: Engaged, Gracious Affect (typically observed): Accepting, Appropriate, Calm, Pleasant Orientation: : Oriented to Self, Oriented to Place, Oriented to  Time, Oriented to Situation Alcohol / Substance Use: Not Applicable Psych Involvement: No (comment)  Admission diagnosis:  Hypokalemia [E87.6] Generalized abdominal pain [R10.84] Influenza A [J10.1] Diarrhea, unspecified type [R19.7] Hypertension, unspecified type [I10] Nausea and vomiting, unspecified vomiting type [R11.2] Patient Active Problem List   Diagnosis Date Noted   Influenza A 01/11/2022   Sepsis ([place]) 01/11/2022   Depression with anxiety 01/11/2022   Nausea vomiting and diarrhea 01/11/2022   Hypokalemia 01/11/2022   Obesity with body mass index (BMI) of 30.0 to 39.9 01/11/2022   Stroke ([place]) 01/11/2022   Atrial fibrillation, chronic ([place]) 01/11/2022   Alcohol use  01/11/2022   Chronic pain of right knee 04/15/2021   Anxiety 03/03/2021   OSA on CPAP 04/21/2020   Thoracic ascending aortic aneurysm ([place]) 03/03/2020   Hearing loss due to cerumen impaction, left 07/23/2019   Lymphedema 12/06/2018   IFG (impaired fasting glucose) 11/03/2018   Hallucinations, visual 07/03/2018    Unstable angina ([place]) 03/25/2018   Gout 05/09/2017   COPD (chronic obstructive pulmonary disease) ([place]) 04/10/2017   History of pulmonary embolism 04/10/2017   History of thyroid disease 04/10/2017   Hyperlipidemia 12/31/2016   Essential hypertension 05/26/2016   Chronic diastolic heart failure ([place]) 05/26/2016   Adrenal adenoma 09/28/2012   Esophageal reflux 12/23/2008   PCP:  Jon Billings, NP Pharmacy:   [facility], [address] [place] [address] Phone: [phone] Fax: [phone]     Social Determinants of Health (SDOH) Interventions    Readmission Risk Interventions     No data to display

## 2022-01-12 NOTE — Assessment & Plan Note (Signed)
Most likely secondary to influenza A.CT abdomen/pelvis is negative for acute intra-abdominal issues. GI pathogen panel negative but C. difficile antigen positive with negative toxin, reflex PCR positive.  Diarrhea improving with no leukocytosis or abdominal pain. -Continue to monitor-most likely colonization -If diarrhea persist then we will treat her for C. difficile with 10 days of p.o. vancomycin

## 2022-01-13 ENCOUNTER — Inpatient Hospital Stay: Payer: Medicaid Other

## 2022-01-13 DIAGNOSIS — R1084 Generalized abdominal pain: Secondary | ICD-10-CM

## 2022-01-13 DIAGNOSIS — R112 Nausea with vomiting, unspecified: Secondary | ICD-10-CM | POA: Diagnosis not present

## 2022-01-13 DIAGNOSIS — I10 Essential (primary) hypertension: Secondary | ICD-10-CM | POA: Diagnosis not present

## 2022-01-13 DIAGNOSIS — J101 Influenza due to other identified influenza virus with other respiratory manifestations: Secondary | ICD-10-CM | POA: Diagnosis not present

## 2022-01-13 MED ORDER — AZITHROMYCIN 250 MG PO TABS: ORAL_TABLET | ORAL | 0 refills | Status: DC

## 2022-01-13 MED ORDER — DM-GUAIFENESIN ER 30-600 MG PO TB12: 1.0000 | ORAL_TABLET | Freq: Two times a day (BID) | ORAL | 0 refills | Status: DC | PRN

## 2022-01-13 MED ORDER — METOPROLOL TARTRATE 25 MG PO TABS: 25.0000 mg | ORAL_TABLET | Freq: Two times a day (BID) | ORAL | 0 refills | Status: AC

## 2022-01-13 MED ORDER — KETOROLAC TROMETHAMINE 15 MG/ML IJ SOLN
15.0000 mg | Freq: Once | INTRAMUSCULAR | Status: AC
  Administered 2022-01-13: 15 mg via INTRAVENOUS
  Filled 2022-01-13: qty 1

## 2022-01-13 MED ORDER — OSELTAMIVIR PHOSPHATE 75 MG PO CAPS: 75.0000 mg | ORAL_CAPSULE | Freq: Two times a day (BID) | ORAL | 0 refills | Status: AC

## 2022-01-13 NOTE — Plan of Care (Signed)
IV removed, discharge instructions reviewed, patient discharged to home, patients sister drove home

## 2022-01-13 NOTE — Discharge Summary (Signed)
Physician Discharge Summary   Patient: [name] MRN: [id number] DOB: [birth year]  Admit date:     01/11/2022  Discharge date: 01/13/22  Discharge Physician: Lorella Nimrod   PCP: Jon Billings, NP   Recommendations at discharge:  Please obtain CBC and BMP in 1 week Follow-up with primary care provider within a week  Discharge Diagnoses: Principal Problem:   Influenza A Active Problems:   Sepsis ([place])   COPD (chronic obstructive pulmonary disease) (HCC)   Nausea and vomiting   Hypertension   Chronic diastolic heart failure (HCC)   Hyperlipidemia   Gout   Hypokalemia   Stroke [facility])   Atrial fibrillation, chronic ([place])   Depression with anxiety   OSA on CPAP   Obesity with body mass index (BMI) of 30.0 to 39.9   Essential hypertension   Generalized abdominal pain   Hospital Course: Taken from H&P.   [name] is a 56 y.o. female with medical history significant of hypertension, hyperlipidemia, COPD on 2 L oxygen, stroke, gout, depression with anxiety, diastolic CHF, PE not on anticoagulants, OSA on CPAP, mild cognitive impairment, cocaine abuse, alcohol abuse, atrial fibrillation not on anticoagulants, who presents with cough, shortness breath for the past 2 days. Her symptoms include productive cough with greenish colored sputum, shortness of breath, fever, chills, malaise, sore throat, body aches. Denies chest pain.  Patient also reports nausea, vomiting, diarrhea and abdominal pain.  No urinary symptoms  Data reviewed independently and ED Course: pt was found to have positive flu A, WBC 8.2, BNP 76, lactic acid 0.8, potassium 3.1, GFR> 60.  Temperature 103, heart rate 119, blood pressure 161/103, RR 31, oxygen saturation 97% on home level 2 L oxygen.  Chest x-ray showed cardiomegaly without infiltration.  CT of abdomen/pelvis is negative for acute intra-abdominal issues.  Patient is admitted to telemetry bed as inpatient.   CT-abd/pelvis 1. No acute findings  within the abdomen or pelvis. 2. Bilateral nonobstructing renal calculi. 3. Small hiatal hernia. 4. Stable left adrenal nodule compatible with a benign adenoma. No follow-up imaging recommended. 5. Remote bilateral inferior pubic rami fractures and remote inferior endplate deformity involving the L3 vertebral body. 6.  Aortic Atherosclerosis (ICD10-I70.0).   CTA: 1. No evidence for acute pulmonary embolus. 2. Subsegmental atelectasis/consolidation is identified within the right lung base. Correlate for any clinical signs/symptoms of right lower lobe pneumonia. 3. Coronary artery calcifications. 4. Unchanged appearance of multiple compression deformities within the thoracic spine, most severe at the T7 level. 5. Similar appearance of chronic left proximal clavicle fracture and bilateral rib fractures. 6.  Aortic Atherosclerosis (ICD10-I70.0).    EKG: Personally reviewed, sinus rhythm, PVC, QTc 454, LAD, early R wave progression.  Labs with potassium of 3.2, rest normal Patient was started on Tamiflu and Z-Pak.  12/19: Vitals stable, maximum temperature recorded at 101.1 yesterday morning.  Potassium at 3.2, rest of the labs stable.  BNP within normal limit, procalcitonin negative. Diarrhea improving and no abdominal pain.  GI pathogen panel negative but C. difficile antigen positive with negative toxin with reflex to PCR which came back positive.  Discussed with ID and it might be a colonization as patient does not have any leukocytosis or abdominal pain.  If she continued to have diarrhea then we will treat her.  12/20: Remained afebrile with stable vitals.  Overnight she was complaining of right upper extremity and shoulder pain which she attributes to the blood pressure cuff.  Imaging was obtained and it was without any  acute abnormality. Patient was ambulated on room air and maximum desaturation was up to 89%, with a quick spontaneous recovery.  She does not qualify for home  oxygen.  She was given 3 more days of Tamiflu and Zithromax.  She was also started on metoprolol for better control of her heart rate.  She is on multiple antihypertensives at home which were continued and blood pressure remained within normal limit.  Patient will continue with rest of her home medications and need to have a close follow-up with her providers for further recommendations.  Assessment and Plan: * Influenza A Patient has productive cough with greenish colored sputum production. Influenza A PCR positive.  Currently saturating well on baseline oxygen requirement of 2 to 3 L and CPAP at night.  Procalcitonin negative She was started on Zithromax and Tamiflu. -Continue with supportive care -Incentive spirometry  Sepsis [facility])  Patient meets criteria for sepsis with heart rate 119, RR 31 and fever of 103.  Lactic acid normal. CTA negative for PE.  Procalcitonin negative.  Sepsis secondary to influenza A infection. -See management above   COPD (chronic obstructive pulmonary disease) ([place]) -Please see above  Nausea and vomiting Most likely secondary to influenza A.CT abdomen/pelvis is negative for acute intra-abdominal issues. GI pathogen panel negative but C. difficile antigen positive with negative toxin, reflex PCR positive.  Diarrhea improving with no leukocytosis or abdominal pain. -Continue to monitor-most likely colonization -If diarrhea persist then we will treat her for C. difficile with 10 days of p.o. vancomycin  Hypertension Blood pressure within goal..  On multiple medications at home. -Amlodipine and clonidine was resumed. -Holding diuretic and hydralazine -We will resume more medications as needed  Chronic diastolic heart failure ([place]) D echo on 03/26/2018 showed EF 60 to 65%.  Patient does not have leg edema or JVD.  BNP is normal at 26.  CHF is compensated. -Hold Lasix  Hyperlipidemia -Continue Lipitor  Gout -Continue home allopurinol and  colchicine  Hypokalemia Potassium at 3.2 with normal magnesium -Replete potassium and monitor  Stroke ([place]) -Continue home aspirin and Lipitor  Atrial fibrillation, chronic (HCC) Currently rate controlled.  Patient is not on any anticoagulation. -Continue metoprolol  Depression with anxiety -Continue home medications  OSA on CPAP -CPAP at night  Obesity with body mass index (BMI) of 30.0 to 39.9 Estimated body mass index is 35.65 kg/m as calculated from the following:   Height as of this encounter: '5\' 6"'$  (1.676 m).   Weight as of this encounter: 100.2 kg.   -This will complicate overall prognosis   Consultants: None Procedures performed: None Disposition: Home Diet recommendation:  Discharge Diet Orders (From admission, onward)     Start     Ordered   01/13/22 0000  Diet - low sodium heart healthy        01/13/22 1643           Cardiac and Carb modified diet DISCHARGE MEDICATION: Allergies as of 01/13/2022       Reactions   Morphine And Related Hives   Penicillins Other (See Comments)   Painful urination Has patient had a PCN reaction causing immediate rash, facial/tongue/throat swelling, SOB or lightheadedness with hypotension: yes Has patient had a PCN reaction causing severe rash involving mucus membranes or skin necrosis: no Has patient had a PCN reaction that required hospitalization no Has patient had a PCN reaction occurring within the last 10 years: no If all of the above answers are "NO", then may proceed with Cephalosporin  use.        Medication List     TAKE these medications    allopurinol 300 MG tablet Commonly known as: ZYLOPRIM Take 1 tablet (300 mg total) by mouth daily.   amLODipine 10 MG tablet Commonly known as: NORVASC Take 1 tablet (10 mg total) by mouth daily.   aspirin EC 81 MG tablet Take 1 tablet (81 mg total) by mouth daily.   atorvastatin 40 MG tablet Commonly known as: LIPITOR Take 1 tablet (40 mg total) by  mouth daily.   azithromycin 250 MG tablet Commonly known as: ZITHROMAX 1 tablet daily for 3 days Start taking on: January 14, 2022   citalopram 10 MG tablet Commonly known as: CELEXA Take 1 tablet (10 mg total) by mouth daily.   cloNIDine 0.1 MG tablet Commonly known as: CATAPRES Take 1 tablet (0.1 mg total) by mouth daily.   colchicine 0.6 MG tablet TAKE ONE (1) TABLET BY MOUTH TWICE DAILY AS NEEDED FOR GOUT FLARES   dextromethorphan-guaiFENesin 30-600 MG 12hr tablet Commonly known as: MUCINEX DM Take 1 tablet by mouth 2 (two) times daily as needed for cough.   gabapentin 300 MG capsule Commonly known as: NEURONTIN Take 2 capsules (600 mg total) by mouth at bedtime. 600 mg qhs   hydrALAZINE 100 MG tablet Commonly known as: APRESOLINE Take 1 tablet (100 mg total) by mouth 3 (three) times daily.   lisinopril 40 MG tablet Commonly known as: ZESTRIL Take 1 tablet (40 mg total) by mouth daily.   metoprolol tartrate 25 MG tablet Commonly known as: LOPRESSOR Take 1 tablet (25 mg total) by mouth 2 (two) times daily.   oseltamivir 75 MG capsule Commonly known as: TAMIFLU Take 1 capsule (75 mg total) by mouth 2 (two) times daily for 3 days.   pantoprazole 40 MG tablet Commonly known as: PROTONIX Take 1 tablet (40 mg total) by mouth daily.   ProAir HFA 108 (90 Base) MCG/ACT inhaler Generic drug: albuterol INHALE TWO (2) PUFFS BY MOUTH EVERY 6 HOURS AS NEEDED FOR WHEEZING OR FOR SHORTNESS OF BREATH   tiotropium 18 MCG inhalation capsule Commonly known as: Spiriva HandiHaler Place 1 capsule (18 mcg total) into inhaler and inhale daily.   Vitamin D3 25 MCG (1000 UT) Caps Take 1 capsule (1,000 Units total) by mouth daily.        Follow-up Information     Jon Billings, NP. Schedule an appointment as soon as possible for a visit in 1 week(s).   Specialty: Nurse Practitioner Contact information: [place] [address] [phone]                 Discharge Exam: Danley Danker Weights   01/11/22 0155 01/12/22 0538  Weight: 101.6 kg 100.2 kg   General.  Obese lady, in no acute distress. Pulmonary.  Lungs clear bilaterally, normal respiratory effort. CV.  Regular rate and rhythm, no JVD, rub or murmur. Abdomen.  Soft, nontender, nondistended, BS positive. CNS.  Alert and oriented .  No focal neurologic deficit. Extremities.  No edema, no cyanosis, pulses intact and symmetrical. Psychiatry.  Judgment and insight appears normal.   Condition at discharge: stable  The results of significant diagnostics from this hospitalization (including imaging, microbiology, ancillary and laboratory) are listed below for reference.   Imaging Studies: DG Shoulder Right  Result Date: 01/13/2022 CLINICAL DATA:  Right shoulder pain since blood pressure cuff was applied to arm when entering the emergency department. EXAM: RIGHT SHOULDER - 2+ VIEW  COMPARISON:  None Available. FINDINGS: Normal bone mineralization. The glenohumeral and acromioclavicular joints are appropriately aligned. No significant osteoarthritis. No acute fracture is seen. No dislocation. Old healed fractures of multiple lateral right ribs as seen on CT chest 01/11/2022. Severe height loss of the T3 and T7 vertebral bodies as better seen on prior CT. IMPRESSION: No acute fracture of the right shoulder. Electronically Signed   By: Yvonne Kendall M.D.   On: 01/13/2022 13:48   CT Angio Chest Pulmonary Embolism (PE) W or WO Contrast  Result Date: 01/11/2022 CLINICAL DATA:  Rule out pulmonary embolism. EXAM: CT ANGIOGRAPHY CHEST WITH CONTRAST TECHNIQUE: Multidetector CT imaging of the chest was performed using the standard protocol during bolus administration of intravenous contrast. Multiplanar CT image reconstructions and MIPs were obtained to evaluate the vascular anatomy. RADIATION DOSE REDUCTION: This exam was performed according to the departmental dose-optimization program which includes  automated exposure control, adjustment of the mA and/or kV according to patient size and/or use of iterative reconstruction technique. CONTRAST:  26m OMNIPAQUE IOHEXOL 350 MG/ML SOLN COMPARISON:  11/06/2021 FINDINGS: Cardiovascular: Satisfactory opacification of the pulmonary arteries to the segmental level. No evidence of pulmonary embolism. Normal heart size. No pericardial effusion. Ascending thoracic aorta is mildly dilated measuring 4.3 cm, image 278/100. Aortic atherosclerotic calcifications and coronary artery calcifications noted. Mediastinum/Nodes: No enlarged mediastinal, hilar, or axillary lymph nodes. Thyroid gland, trachea, and esophagus demonstrate no significant findings. Lungs/Pleura: No pleural fluid. Subsegmental atelectasis/consolidation is identified within the right lung base, image 99/5. Multiple scattered areas of pleural thickening adjacent to remote healed rib fractures. Upper Abdomen: Chronic deformity involving the left clavicle at the sternal clavicular joint is again noted, image 23/6. Musculoskeletal: Numerous bilateral remote healed rib fracture deformities are again seen. Unchanged appearance of multiple compression deformities within the thoracic spine, most severe at the T7 level. Stable posttraumatic deformity involving the left proximal clavicle. Review of the MIP images confirms the above findings. IMPRESSION: 1. No evidence for acute pulmonary embolus. 2. Subsegmental atelectasis/consolidation is identified within the right lung base. Correlate for any clinical signs/symptoms of right lower lobe pneumonia. 3. Coronary artery calcifications. 4. Unchanged appearance of multiple compression deformities within the thoracic spine, most severe at the T7 level. 5. Similar appearance of chronic left proximal clavicle fracture and bilateral rib fractures. 6.  Aortic Atherosclerosis (ICD10-I70.0). Electronically Signed   By: TKerby MoorsM.D.   On: 01/11/2022 12:12   CT Abdomen  Pelvis W Contrast  Result Date: 01/11/2022 CLINICAL DATA:  Abdominal pain EXAM: CT ABDOMEN AND PELVIS WITH CONTRAST TECHNIQUE: Multidetector CT imaging of the abdomen and pelvis was performed using the standard protocol following bolus administration of intravenous contrast. RADIATION DOSE REDUCTION: This exam was performed according to the departmental dose-optimization program which includes automated exposure control, adjustment of the mA and/or kV according to patient size and/or use of iterative reconstruction technique. CONTRAST:  1046mOMNIPAQUE IOHEXOL 300 MG/ML  SOLN COMPARISON:  10/02/2020 FINDINGS: Lower chest: No pleural fluid or airspace disease. Hepatobiliary: No suspicious enhancing liver lesions. Gallbladder appears normal. No bile duct dilatation. Pancreas: Unremarkable. No pancreatic ductal dilatation or surrounding inflammatory changes. Spleen: Normal in size without focal abnormality. Adrenals/Urinary Tract: Right adrenal gland appears normal. Left adrenal nodule measures 3 x 1.8 cm and is unchanged compared with the previous exam compatible with a benign adenoma. No follow-up imaging recommended. Left kidney cyst measures 1.7 cm. Several, bilateral too small to characterize low-density kidney lesions are also noted. No follow-up imaging recommended. 2  mm stone within inferior pole of the right kidney is identified. Within the inferior pole of the left kidney there is a 2 mm stone. No hydronephrosis identified bilaterally. No hydroureter or ureteral calculi. Urinary bladder is unremarkable. Stomach/Bowel: Small hiatal hernia. The appendix is visualized and appears normal. Scattered colonic diverticula noted without signs of acute diverticulitis. No bowel wall thickening, inflammation, or distension. Vascular/Lymphatic: Aortic atherosclerosis. No signs of abdominopelvic adenopathy. Reproductive: Uterus and bilateral adnexa are unremarkable. Other: No free fluid or fluid collections. The no  signs of pneumoperitoneum. Musculoskeletal: Remote bilateral inferior pubic rami fractures. Remote inferior endplate deformity involving the L3 vertebral body is unchanged from previous exam. IMPRESSION: 1. No acute findings within the abdomen or pelvis. 2. Bilateral nonobstructing renal calculi. 3. Small hiatal hernia. 4. Stable left adrenal nodule compatible with a benign adenoma. No follow-up imaging recommended. 5. Remote bilateral inferior pubic rami fractures and remote inferior endplate deformity involving the L3 vertebral body. 6.  Aortic Atherosclerosis (ICD10-I70.0). Electronically Signed   By: Kerby Moors M.D.   On: 01/11/2022 05:41   DG Chest Port 1 View  Result Date: 01/11/2022 CLINICAL DATA:  56 year old female with cough, headache, abdominal pain, diarrhea, vomiting. EXAM: PORTABLE CHEST 1 VIEW COMPARISON:  CT Chest, Abdomen, and Pelvis 11/06/2021 and earlier. FINDINGS: Portable AP upright view at 0442 hours. Severe chronic medial left clavicle and bilateral rib deformities. Stable cardiomegaly and mediastinal contours. Allowing for portable technique the lungs are clear. Visualized tracheal air column is within normal limits. Paucity of bowel gas in the visible abdomen. IMPRESSION: 1.  No acute cardiopulmonary abnormality. 2. Chronic cardiomegaly, pronounced chronic bony chest wall deformities. Electronically Signed   By: Genevie Ann M.D.   On: 01/11/2022 05:36    Microbiology: Results for orders placed or performed during the hospital encounter of 01/11/22  Resp panel by RT-PCR (RSV, Flu A&B, Covid) Anterior Nasal Swab     Status: Abnormal   Collection Time: 01/11/22  1:58 AM   Specimen: Anterior Nasal Swab  Result Value Ref Range Status   SARS Coronavirus 2 by RT PCR NEGATIVE NEGATIVE Final    Comment: (NOTE) SARS-CoV-2 target nucleic acids are NOT DETECTED.  The SARS-CoV-2 RNA is generally detectable in upper respiratory specimens during the acute phase of infection. The  lowest concentration of SARS-CoV-2 viral copies this assay can detect is 138 copies/mL. A negative result does not preclude SARS-Cov-2 infection and should not be used as the sole basis for treatment or other patient management decisions. A negative result may occur with  improper specimen collection/handling, submission of specimen other than nasopharyngeal swab, presence of viral mutation(s) within the areas targeted by this assay, and inadequate number of viral copies(<138 copies/mL). A negative result must be combined with clinical observations, patient history, and epidemiological information. The expected result is Negative.  Fact Sheet for Patients:  EntrepreneurPulse.com.au  Fact Sheet for Healthcare Providers:  IncredibleEmployment.be  This test is no t yet approved or cleared by the [place] FDA and  has been authorized for detection and/or diagnosis of SARS-CoV-2 by FDA under an Emergency Use Authorization (EUA). This EUA will remain  in effect (meaning this test can be used) for the duration of the COVID-19 declaration under Section 564(b)(1) of the Act, 21 U.S.C.section 360bbb-3(b)(1), unless the authorization is terminated  or revoked sooner.       Influenza A by PCR POSITIVE (A) NEGATIVE Final   Influenza B by PCR NEGATIVE NEGATIVE Final    Comment: (NOTE) The  Xpert Xpress SARS-CoV-2/FLU/RSV plus assay is intended as an aid in the diagnosis of influenza from Nasopharyngeal swab specimens and should not be used as a sole basis for treatment. Nasal washings and aspirates are unacceptable for Xpert Xpress SARS-CoV-2/FLU/RSV testing.  Fact Sheet for Patients: EntrepreneurPulse.com.au  Fact Sheet for Healthcare Providers: IncredibleEmployment.be  This test is not yet approved or cleared by the [place] FDA and has been authorized for detection and/or diagnosis of SARS-CoV-2 by FDA  under an Emergency Use Authorization (EUA). This EUA will remain in effect (meaning this test can be used) for the duration of the COVID-19 declaration under Section 564(b)(1) of the Act, 21 U.S.C. section 360bbb-3(b)(1), unless the authorization is terminated or revoked.     Resp Syncytial Virus by PCR NEGATIVE NEGATIVE Final    Comment: (NOTE) Fact Sheet for Patients: EntrepreneurPulse.com.au  Fact Sheet for Healthcare Providers: IncredibleEmployment.be  This test is not yet approved or cleared by the [place] FDA and has been authorized for detection and/or diagnosis of SARS-CoV-2 by FDA under an Emergency Use Authorization (EUA). This EUA will remain in effect (meaning this test can be used) for the duration of the COVID-19 declaration under Section 564(b)(1) of the Act, 21 U.S.C. section 360bbb-3(b)(1), unless the authorization is terminated or revoked.  Performed at [facility], [place], [place] [address]   C Difficile Quick Screen w PCR reflex     Status: Abnormal   Collection Time: 01/11/22 11:22 AM   Specimen: STOOL  Result Value Ref Range Status   C Diff antigen POSITIVE (A) NEGATIVE Final   C Diff toxin NEGATIVE NEGATIVE Final   C Diff interpretation Results are indeterminate. See PCR results.  Final    Comment: VALID Performed at [facility], [address] [place], [place] [address]   Gastrointestinal Panel by PCR , Stool     Status: None   Collection Time: 01/11/22 11:22 AM   Specimen: Stool  Result Value Ref Range Status   Campylobacter species NOT DETECTED NOT DETECTED Final   Plesimonas shigelloides NOT DETECTED NOT DETECTED Final   Salmonella species NOT DETECTED NOT DETECTED Final   Yersinia enterocolitica NOT DETECTED NOT DETECTED Final   Vibrio species NOT DETECTED NOT DETECTED Final   Vibrio cholerae NOT DETECTED NOT DETECTED Final   Enteroaggregative E coli (EAEC) NOT  DETECTED NOT DETECTED Final   Enteropathogenic E coli (EPEC) NOT DETECTED NOT DETECTED Final   Enterotoxigenic E coli (ETEC) NOT DETECTED NOT DETECTED Final   Shiga like toxin producing E coli (STEC) NOT DETECTED NOT DETECTED Final   Shigella/Enteroinvasive E coli (EIEC) NOT DETECTED NOT DETECTED Final   Cryptosporidium NOT DETECTED NOT DETECTED Final   Cyclospora cayetanensis NOT DETECTED NOT DETECTED Final   Entamoeba histolytica NOT DETECTED NOT DETECTED Final   Giardia lamblia NOT DETECTED NOT DETECTED Final   Adenovirus F40/41 NOT DETECTED NOT DETECTED Final   Astrovirus NOT DETECTED NOT DETECTED Final   Norovirus GI/GII NOT DETECTED NOT DETECTED Final   Rotavirus A NOT DETECTED NOT DETECTED Final   Sapovirus (I, II, IV, and V) NOT DETECTED NOT DETECTED Final    Comment: Performed at [facility], [address] [place], [place] [address]  C. Diff by PCR, Reflexed     Status: Abnormal   Collection Time: 01/11/22 11:22 AM  Result Value Ref Range Status   Toxigenic C. Difficile by PCR POSITIVE (A) NEGATIVE Final    Comment: Positive for toxigenic C. difficile  with little to no toxin production. Only treat if clinical presentation suggests symptomatic illness. Performed at [facility], [address] [place], [place] [address]     Labs: CBC: Recent Labs  Lab 01/11/22 0158 01/12/22 0459  WBC 8.2 5.6  HGB 13.0 12.1  HCT 40.7 39.2  MCV 95.8 96.8  PLT 189 376   Basic Metabolic Panel: Recent Labs  Lab 01/11/22 0327 01/12/22 0451 01/12/22 0459  NA 139  --  140  K 3.1*  --  3.2*  CL 101  --  106  CO2 28  --  28  GLUCOSE 101*  --  80  BUN 11  --  15  CREATININE 0.90  --  0.84  CALCIUM 9.2  --  8.2*  MG 1.9 2.0  --    Liver Function Tests: Recent Labs  Lab 01/11/22 0327  AST 19  ALT 10  ALKPHOS 68  BILITOT 1.9*  PROT 7.7  ALBUMIN 3.9   CBG: Recent Labs  Lab 01/12/22 1132  GLUCAP 116*    Discharge time spent: greater than 30  minutes.  This record has been created using Systems analyst. Errors have been sought and corrected,but may not always be located. Such creation errors do not reflect on the standard of care.   Signed: Lorella Nimrod, MD [facility] 01/13/2022

## 2022-01-13 NOTE — Progress Notes (Signed)
O2 saturation on room air:  Sitting 93%  Standing 92%  Ambulating in room 89%
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
## 2022-01-14 ENCOUNTER — Telehealth: Payer: Self-pay

## 2022-01-14 NOTE — Telephone Encounter (Signed)
Transition Care Management Unsuccessful Follow-up Telephone Call  Date of discharge and from where:  [place] 01/13/2022  Attempts:  1st Attempt  Reason for unsuccessful TCM follow-up call:  Left voice message Juanda Crumble, [place] Direct Dial [phone]

## 2022-01-20 NOTE — Telephone Encounter (Signed)
Transition Care Management Unsuccessful Follow-up Telephone Call  Date of discharge and from where:  [place] 01/13/2022  Attempts:  2nd Attempt  Reason for unsuccessful TCM follow-up call:  Left voice message Juanda Crumble, [place] Direct Dial [phone]

## 2022-01-21 NOTE — Telephone Encounter (Signed)
Transition Care Management Unsuccessful Follow-up Telephone Call  Date of discharge and from where:  [place] 01/13/2022  Attempts:  3rd Attempt  Reason for unsuccessful TCM follow-up call:  Left voice message Juanda Crumble, [place] Direct Dial [phone]

## 2022-01-29 ENCOUNTER — Other Ambulatory Visit: Payer: Self-pay | Admitting: Nurse Practitioner

## 2022-01-29 NOTE — Telephone Encounter (Signed)
Requested Prescriptions  Pending Prescriptions Disp Refills   pantoprazole (PROTONIX) 40 MG tablet [Pharmacy Med Name: PANTOPRAZOLE SOD DR 40 MG TAB] 90 tablet 0    Sig: TAKE 1 TABLET BY MOUTH EVERY DAY     Gastroenterology: Proton Pump Inhibitors Passed - 01/29/2022  1:26 AM      Passed - Valid encounter within last 12 months    Recent Outpatient Visits           1 month ago Chronic diastolic heart failure ([place])   [place], NP   3 months ago Annual physical exam   [facility] Jon Billings, NP   6 months ago Chronic obstructive pulmonary disease, unspecified COPD type ([place])   [facility] Jon Billings, NP   9 months ago Chronic pain of right knee   [facility] Jon Billings, NP   9 months ago Lymphedema   [place], NP               hydrALAZINE (APRESOLINE) 100 MG tablet [Pharmacy Med Name: HYDRALAZINE 100 MG TABLET] 270 tablet 0    Sig: TAKE 1 TABLET BY MOUTH 3 TIMES DAILY.     Cardiovascular:  Vasodilators Failed - 01/29/2022  1:26 AM      Failed - ANA Screen, Ifa, Serum in normal range and within 360 days    No results found for: "ANA", "ANATITER", "LABANTI"       Passed - HCT in normal range and within 360 days    HCT  Date Value Ref Range Status  01/12/2022 39.2 36.0 - 46.0 % Final   Hematocrit  Date Value Ref Range Status  10/16/2021 42.6 34.0 - 46.6 % Final         Passed - HGB in normal range and within 360 days    Hemoglobin  Date Value Ref Range Status  01/12/2022 12.1 12.0 - 15.0 g/dL Final  10/16/2021 13.6 11.1 - 15.9 g/dL Final         Passed - RBC in normal range and within 360 days    RBC  Date Value Ref Range Status  01/12/2022 4.05 3.87 - 5.11 MIL/uL Final         Passed - WBC in normal range and within 360 days    WBC  Date Value Ref Range Status  01/12/2022 5.6 4.0 - 10.5 K/uL Final         Passed - PLT in normal range  and within 360 days    Platelets  Date Value Ref Range Status  01/12/2022 155 150 - 400 K/uL Final  10/16/2021 226 150 - 450 x10E3/uL Final         Passed - Last BP in normal range    BP Readings from Last 1 Encounters:  01/13/22 106/86         Passed - Valid encounter within last 12 months    Recent Outpatient Visits           1 month ago Chronic diastolic heart failure ([place])   [facility] Jon Billings, NP   3 months ago Annual physical exam   [facility] Jon Billings, NP   6 months ago Chronic obstructive pulmonary disease, unspecified COPD type ([place])   [facility] Jon Billings, NP   9 months ago Chronic pain of right knee   [facility] Jon Billings, NP   9 months ago Lymphedema   [facility]  Jon Billings, NP

## 2022-02-23 ENCOUNTER — Ambulatory Visit (LOCAL_COMMUNITY_HEALTH_CENTER): Payer: Medicaid Other

## 2022-02-23 DIAGNOSIS — Z111 Encounter for screening for respiratory tuberculosis: Secondary | ICD-10-CM

## 2022-02-26 ENCOUNTER — Ambulatory Visit (LOCAL_COMMUNITY_HEALTH_CENTER): Payer: Medicaid Other

## 2022-02-26 DIAGNOSIS — Z111 Encounter for screening for respiratory tuberculosis: Secondary | ICD-10-CM

## 2022-02-26 LAB — TB SKIN TEST
Induration: 0 mm
TB Skin Test: NEGATIVE

## 2022-03-05 ENCOUNTER — Other Ambulatory Visit: Payer: Self-pay | Admitting: Nurse Practitioner

## 2022-03-05 NOTE — Telephone Encounter (Signed)
Unable to refill per protocol, Rx request is too soon. Last refill 10/16/21 for 90 and 1 refill.  Requested Prescriptions  Pending Prescriptions Disp Refills   citalopram (CELEXA) 10 MG tablet [Pharmacy Med Name: CITALOPRAM HBR 10 MG TABLET] 90 tablet 1    Sig: TAKE 1 TABLET BY MOUTH EVERY DAY     Psychiatry:  Antidepressants - SSRI Passed - 03/05/2022  4:25 AM      Passed - Completed PHQ-2 or PHQ-9 in the last 360 days      Passed - Valid encounter within last 6 months    Recent Outpatient Visits           2 months ago Chronic diastolic heart failure ([place])   [place], NP   4 months ago Annual physical exam   [place], NP   7 months ago Chronic obstructive pulmonary disease, unspecified COPD type [facility])   [place] Jon Billings, NP   10 months ago Chronic pain of right knee   [place], NP   10 months ago [place] Jon Billings, NP

## 2022-06-18 ENCOUNTER — Other Ambulatory Visit: Payer: Self-pay | Admitting: Nurse Practitioner

## 2022-06-18 NOTE — Telephone Encounter (Signed)
Requested medications are due for refill today.  yes  Requested medications are on the active medications list.  yes  Last refill. 10/16/2021 #90 1 rf  Future visit scheduled.   yes  Notes to clinic.  Expired labs.    Requested Prescriptions  Pending Prescriptions Disp Refills   allopurinol (ZYLOPRIM) 300 MG tablet 90 tablet 1    Sig: Take 1 tablet (300 mg total) by mouth daily.     Endocrinology:  Gout Agents - allopurinol Failed - 06/18/2022  2:44 PM      Failed - Uric Acid in normal range and within 360 days    Uric Acid  Date Value Ref Range Status  07/02/2019 8.2 (H) 3.0 - 7.2 mg/dL Final    Comment:               Therapeutic target for gout patients: <6.0         Passed - Cr in normal range and within 360 days    Creatinine  Date Value Ref Range Status  01/15/2014 1.36 (H) 0.60 - 1.30 mg/dL Final   Creatinine, Ser  Date Value Ref Range Status  01/12/2022 0.84 0.44 - 1.00 mg/dL Final         Passed - Valid encounter within last 12 months    Recent Outpatient Visits           6 months ago Chronic diastolic heart failure (HCC)   [facility] Larae Grooms, NP   8 months ago Annual physical exam   [facility] Larae Grooms, NP   10 months ago Chronic obstructive pulmonary disease, unspecified COPD type (HCC)   [place] [facility] Larae Grooms, NP   1 year ago Chronic pain of right knee   [facility] Larae Grooms, NP   1 year ago Lymphedema   [facility] Larae Grooms, NP       Future Appointments             In 4 days Larae Grooms, NP [facility]            Passed - CBC within normal limits and completed in the last 12 months    WBC  Date Value Ref Range Status  01/12/2022 5.6 4.0 - 10.5 K/uL Final   RBC  Date Value Ref Range Status  01/12/2022 4.05 3.87 - 5.11 MIL/uL Final    Hemoglobin  Date Value Ref Range Status  01/12/2022 12.1 12.0 - 15.0 g/dL Final  16/10/9602 54.0 11.1 - 15.9 g/dL Final   HCT  Date Value Ref Range Status  01/12/2022 39.2 36.0 - 46.0 % Final   Hematocrit  Date Value Ref Range Status  10/16/2021 42.6 34.0 - 46.6 % Final   MCHC  Date Value Ref Range Status  01/12/2022 30.9 30.0 - 36.0 g/dL Final   [facility]  Date Value Ref Range Status  01/12/2022 29.9 26.0 - 34.0 pg Final   MCV  Date Value Ref Range Status  01/12/2022 96.8 80.0 - 100.0 fL Final  10/16/2021 95 79 - 97 fL Final  01/15/2014 87 80 - 100 fL Final   No results found for: "PLTCOUNTKUC", "LABPLAT", "POCPLA" RDW  Date Value Ref Range Status  01/12/2022 14.2 11.5 - 15.5 % Final  10/16/2021 14.0 11.7 - 15.4 % Final  01/15/2014 16.0 (H) 11.5 - 14.5 % Final

## 2022-06-18 NOTE — Telephone Encounter (Signed)
Medication Refill - Medication: allopurinol (ZYLOPRIM) 300 MG tablet [[id number]]   Has the patient contacted their pharmacy? Yes.    (Agent: If yes, when and what did the pharmacy advise?) Contact PCP   Preferred Pharmacy (with phone number or street name): [id number] [place],  - [address]   Has the patient been seen for an appointment in the last year OR does the patient have an upcoming appointment? Yes.    Agent: Please be advised that RX refills may take up to 3 business days. We ask that you follow-up with your pharmacy.

## 2022-06-22 ENCOUNTER — Ambulatory Visit: Payer: Medicaid Other | Admitting: Nurse Practitioner

## 2022-06-28 ENCOUNTER — Encounter: Payer: Self-pay | Admitting: Nurse Practitioner

## 2022-06-30 ENCOUNTER — Ambulatory Visit: Payer: Medicaid Other | Admitting: Nurse Practitioner

## 2022-06-30 NOTE — Progress Notes (Deleted)
There were no vitals taken for this visit.   Subjective:    Patient ID: [name], female    DOB: Feb 18, 1965, 57 y.o.   MRN: [id number]  HPI: [name] is a 57 y.o. female  No chief complaint on file.  OXYGEN Patient states she has not made an appt with Pulmonology.   HYPERTENSION / HYPERLIPIDEMIA Patient states she does weigh herself daily.  No change in her weight.  Feels like it hasn't been changing Satisfied with current treatment? yes Duration of hypertension: years BP monitoring frequency: not checking BP range: "high" BP medication side effects: no Past BP meds:  hydralazine clonidine, amlodipine, and lisinopril Duration of hyperlipidemia: years Cholesterol medication side effects: no Cholesterol supplements: none Past cholesterol medications: atorvastain (lipitor),  Medication compliance: excellent compliance Aspirin: yes Recent stressors: no Recurrent headaches: no Visual changes: no Palpitations: no Dyspnea: yes Chest pain: no Lower extremity edema: no Dizzy/lightheaded: no  COPD COPD status: improved Satisfied with current treatment?: no Oxygen use: no - would like oxygen. She feels like she would benefit from oxygen to help her increase her ability to walk further Dyspnea frequency: daily Cough frequency: occasionally Rescue inhaler frequency:  a couple times a week Limitation of activity: yes Productive cough:  Last Spirometry:  Pneumovax: Up to Date Influenza: Not up to Date  ANXIETY Patient states she feels a difference in her anxiety since starting the Celexa daily.  Denies concerns at visit today.    Flowsheet Row Office Visit from 10/16/2021 in [facility]  PHQ-9 Total Score 11        10/16/2021    8:35 AM 10/14/2021   10:58 AM 08/14/2021   10:34 AM 07/31/2021   10:57 AM  GAD 7 : Generalized Anxiety Score  Nervous, Anxious, on Edge 3 0 2 2  Control/stop worrying 2 0 2 2  Worry too much - different  things 2 0 2 3  Trouble relaxing 2 0 2 3  Restless 2 0 2 2  Easily annoyed or irritable 2 0 2 2  Afraid - awful might happen 2 0 1 1  Total GAD 7 Score 15 0 13 15  Anxiety Difficulty Not difficult at all Not difficult at all Somewhat difficult Somewhat difficult    Relevant past medical, surgical, family and social history reviewed and updated as indicated. Interim medical history since our last visit reviewed. Allergies and medications reviewed and updated.  Review of Systems  Eyes:  Negative for visual disturbance.  Respiratory:  Negative for cough, chest tightness and shortness of breath.   Cardiovascular:  Negative for chest pain, palpitations and leg swelling.  Neurological:  Negative for dizziness and headaches.  Psychiatric/Behavioral:  Positive for dysphoric mood. Negative for suicidal ideas. The patient is nervous/anxious.     Per HPI unless specifically indicated above     Objective:    There were no vitals taken for this visit.  Wt Readings from Last 3 Encounters:  01/12/22 220 lb 14.4 oz (100.2 kg)  12/08/21 224 lb 14.4 oz (102 kg)  11/06/21 224 lb (101.6 kg)    Physical Exam Vitals and nursing note reviewed.  Constitutional:      General: She is not in acute distress.    Appearance: Normal appearance. She is normal weight. She is not ill-appearing, toxic-appearing or diaphoretic.  HENT:     Head: Normocephalic.     Right Ear: External ear normal.     Left Ear: External ear  normal.     Nose: Nose normal.     Mouth/Throat:     Mouth: Mucous membranes are moist.     Pharynx: Oropharynx is clear.  Eyes:     General:        Right eye: No discharge.        Left eye: No discharge.     Extraocular Movements: Extraocular movements intact.     Conjunctiva/sclera: Conjunctivae normal.     Pupils: Pupils are equal, round, and reactive to light.  Cardiovascular:     Rate and Rhythm: Normal rate and regular rhythm.     Heart sounds: No murmur heard. Pulmonary:      Effort: Pulmonary effort is normal. No respiratory distress.     Breath sounds: Normal breath sounds. No wheezing or rales.  Musculoskeletal:     Cervical back: Normal range of motion and neck supple.     Right lower leg: No edema.     Left lower leg: No edema.  Skin:    General: Skin is warm and dry.     Capillary Refill: Capillary refill takes less than 2 seconds.  Neurological:     General: No focal deficit present.     Mental Status: She is alert and oriented to person, place, and time. Mental status is at baseline.  Psychiatric:        Mood and Affect: Mood normal.        Behavior: Behavior normal.        Thought Content: Thought content normal.        Judgment: Judgment normal.    Results for orders placed or performed in visit on 02/23/22  TB Skin Test  Result Value Ref Range   TB Skin Test Negative    Induration 0 mm      Assessment & Plan:   Problem List Items Addressed This Visit   None    Follow up plan: No follow-ups on file.

## 2022-07-04 ENCOUNTER — Other Ambulatory Visit: Payer: Self-pay | Admitting: Nurse Practitioner

## 2022-07-06 ENCOUNTER — Ambulatory Visit (INDEPENDENT_AMBULATORY_CARE_PROVIDER_SITE_OTHER): Payer: Medicaid Other | Admitting: Nurse Practitioner

## 2022-07-06 ENCOUNTER — Encounter: Payer: Self-pay | Admitting: Nurse Practitioner

## 2022-07-06 ENCOUNTER — Other Ambulatory Visit: Payer: Self-pay | Admitting: Nurse Practitioner

## 2022-07-06 VITALS — BP 156/115 | HR 83 | Temp 97.9°F | Wt 239.6 lb

## 2022-07-06 DIAGNOSIS — R7301 Impaired fasting glucose: Secondary | ICD-10-CM | POA: Diagnosis not present

## 2022-07-06 DIAGNOSIS — I10 Essential (primary) hypertension: Secondary | ICD-10-CM

## 2022-07-06 DIAGNOSIS — I5032 Chronic diastolic (congestive) heart failure: Secondary | ICD-10-CM

## 2022-07-06 DIAGNOSIS — J449 Chronic obstructive pulmonary disease, unspecified: Secondary | ICD-10-CM | POA: Diagnosis not present

## 2022-07-06 DIAGNOSIS — E785 Hyperlipidemia, unspecified: Secondary | ICD-10-CM

## 2022-07-06 DIAGNOSIS — F418 Other specified anxiety disorders: Secondary | ICD-10-CM

## 2022-07-06 MED ORDER — PROAIR HFA 108 (90 BASE) MCG/ACT IN AERS: INHALATION_SPRAY | RESPIRATORY_TRACT | 0 refills | Status: DC

## 2022-07-06 MED ORDER — AMLODIPINE BESYLATE 10 MG PO TABS: 10.0000 mg | ORAL_TABLET | Freq: Every day | ORAL | 0 refills | Status: DC

## 2022-07-06 MED ORDER — TIOTROPIUM BROMIDE MONOHYDRATE 18 MCG IN CAPS: 18.0000 ug | ORAL_CAPSULE | Freq: Every day | RESPIRATORY_TRACT | 1 refills | Status: DC

## 2022-07-06 MED ORDER — CLONIDINE HCL 0.1 MG PO TABS: 0.1000 mg | ORAL_TABLET | Freq: Every day | ORAL | 0 refills | Status: DC

## 2022-07-06 MED ORDER — PANTOPRAZOLE SODIUM 40 MG PO TBEC: 40.0000 mg | DELAYED_RELEASE_TABLET | Freq: Every day | ORAL | 0 refills | Status: DC

## 2022-07-06 MED ORDER — ATORVASTATIN CALCIUM 40 MG PO TABS: 40.0000 mg | ORAL_TABLET | Freq: Every day | ORAL | 0 refills | Status: DC

## 2022-07-06 MED ORDER — LISINOPRIL 40 MG PO TABS: 40.0000 mg | ORAL_TABLET | Freq: Every day | ORAL | 0 refills | Status: AC

## 2022-07-06 NOTE — Progress Notes (Signed)
BP (!) 156/115   Pulse 83   Temp 97.9 F (36.6 C) (Oral)   Wt 239 lb 9.6 oz (108.7 kg)   SpO2 91%   BMI 38.67 kg/m    Subjective:    Patient ID: [name], female    DOB: Dec 31, 1965, 57 y.o.   MRN: [id number]  HPI: [name] is a 57 y.o. female  Chief Complaint  Patient presents with   Hypertension   OXYGEN Patient states she has not made an appt with Pulmonology.  Does not have a pulse ox.    HYPERTENSION / HYPERLIPIDEMIA Patient states she does not weight daily but weighs every once in awhile.  Has not seen cardiology.  Patient is having to sleep with 5 pillows.  Satisfied with current treatment? yes Duration of hypertension: years BP monitoring frequency: not checking BP range: 189-90 (always high) BP medication side effects: no Past BP meds:  hydralazine clonidine, amlodipine, and lisinopril Duration of hyperlipidemia: years Cholesterol medication side effects: no Cholesterol supplements: none Past cholesterol medications: atorvastain (lipitor),  Medication compliance: excellent compliance Aspirin: yes Recent stressors: no Recurrent headaches: no Visual changes: no Palpitations: no Dyspnea: yes Chest pain: no Lower extremity edema: yes Dizzy/lightheaded: no  COPD COPD status: improved Satisfied with current treatment?: no Oxygen use: no - would like oxygen. She feels like she would benefit from oxygen to help her increase her ability to walk further Dyspnea frequency: daily Cough frequency: occasionally Rescue inhaler frequency:  a couple times a week Limitation of activity: yes Productive cough:  Last Spirometry:  Pneumovax: Up to Date Influenza: Not up to Date  ANXIETY Patient states her anxiety has been off and on.  She does not want to make medication.  She gets down because she is isn't able to work and socialize with people.    Flowsheet Row Office Visit from 07/06/2022 in [facility]  PHQ-9 Total Score 10          07/06/2022   11:22 AM 10/16/2021    8:35 AM 10/14/2021   10:58 AM 08/14/2021   10:34 AM  GAD 7 : Generalized Anxiety Score  Nervous, Anxious, on Edge 1 3 0 2  Control/stop worrying 1 2 0 2  Worry too much - different things 1 2 0 2  Trouble relaxing 1 2 0 2  Restless 1 2 0 2  Easily annoyed or irritable 1 2 0 2  Afraid - awful might happen 2 2 0 1  Total GAD 7 Score 8 15 0 13  Anxiety Difficulty Somewhat difficult Not difficult at all Not difficult at all Somewhat difficult    Relevant past medical, surgical, family and social history reviewed and updated as indicated. Interim medical history since our last visit reviewed. Allergies and medications reviewed and updated.  Review of Systems  Eyes:  Negative for visual disturbance.  Respiratory:  Positive for shortness of breath. Negative for cough and chest tightness.   Cardiovascular:  Positive for leg swelling. Negative for chest pain and palpitations.  Neurological:  Negative for dizziness and headaches.  Psychiatric/Behavioral:  Positive for dysphoric mood. Negative for suicidal ideas. The patient is nervous/anxious.     Per HPI unless specifically indicated above     Objective:    BP (!) 156/115   Pulse 83   Temp 97.9 F (36.6 C) (Oral)   Wt 239 lb 9.6 oz (108.7 kg)   SpO2 91%   BMI 38.67 kg/m   Wt Readings  from Last 3 Encounters:  07/06/22 239 lb 9.6 oz (108.7 kg)  01/12/22 220 lb 14.4 oz (100.2 kg)  12/08/21 224 lb 14.4 oz (102 kg)    Physical Exam Vitals and nursing note reviewed.  Constitutional:      General: She is not in acute distress.    Appearance: Normal appearance. She is normal weight. She is not ill-appearing, toxic-appearing or diaphoretic.  HENT:     Head: Normocephalic.     Right Ear: External ear normal.     Left Ear: External ear normal.     Nose: Nose normal.     Mouth/Throat:     Mouth: Mucous membranes are moist.     Pharynx: Oropharynx is clear.  Eyes:     General:         Right eye: No discharge.        Left eye: No discharge.     Extraocular Movements: Extraocular movements intact.     Conjunctiva/sclera: Conjunctivae normal.     Pupils: Pupils are equal, round, and reactive to light.  Cardiovascular:     Rate and Rhythm: Normal rate. Rhythm irregular.     Heart sounds: No murmur heard. Pulmonary:     Effort: Pulmonary effort is normal. No respiratory distress.     Breath sounds: Normal breath sounds. No wheezing or rales.  Musculoskeletal:     Cervical back: Normal range of motion and neck supple.     Right lower leg: Edema present.     Left lower leg: Edema present.  Skin:    General: Skin is warm and dry.     Capillary Refill: Capillary refill takes less than 2 seconds.  Neurological:     General: No focal deficit present.     Mental Status: She is alert and oriented to person, place, and time. Mental status is at baseline.  Psychiatric:        Mood and Affect: Mood normal.        Behavior: Behavior normal.        Thought Content: Thought content normal.        Judgment: Judgment normal.     Results for orders placed or performed in visit on 02/23/22  TB Skin Test  Result Value Ref Range   TB Skin Test Negative    Induration 0 mm      Assessment & Plan:   Problem List Items Addressed This Visit       Cardiovascular and Mediastinum   Chronic diastolic heart failure (HCC) (Chronic)    Chronic. Has not followed up with HF clinic.  Discussed with patient the importance of taking medications regularly.  On lasix 20mg  daily.  Labs ordered today.  Will needs to keep 3 month follow up for further refills. Not weighing herself daily.  Patient has lower extremity swelling and sleeping with 5 pillows.  Made an appt for patient at the HF clinic for Thursday at 930. - Reminded to call for an overnight weight gain of >2 pounds or a weekly weight gain of >5 pounds - not adding salt to food and read food labels. Reviewed the importance of keeping  daily sodium intake to 2000mg  daily. - Avoid Ibuprofen products.       Relevant Medications   amLODipine (NORVASC) 10 MG tablet   atorvastatin (LIPITOR) 40 MG tablet   cloNIDine (CATAPRES) 0.1 MG tablet   lisinopril (ZESTRIL) 40 MG tablet   Essential hypertension - Primary    Chronic.  Uncontrolled.  Continue with current medication regimen of Amlodipine, Clonidine, Metoprolol and Lisinopril.  Admits to noncompliance with medications.  Encouraged patient to take medications as prescribed.  Refills only sent for 3 months due to non compliance with appointments.  Will need to keep appointments for further refills. Labs ordered today.  Return to clinic in 3 months for reevaluation.  Call sooner if concerns arise.        Relevant Medications   amLODipine (NORVASC) 10 MG tablet   atorvastatin (LIPITOR) 40 MG tablet   cloNIDine (CATAPRES) 0.1 MG tablet   lisinopril (ZESTRIL) 40 MG tablet   Other Relevant Orders   Comp Met (CMET)     Respiratory   COPD (chronic obstructive pulmonary disease) (HCC)    Chronic. Does Endorse SOB.  Has not been to Pulmonology for evaluation for oxygen. Patient has not seen Pulmonology.  Is on Oxygen ordered by the hospital.  Will need to see Pulmonology for further evaluation and management of Oxygen.  Encouraged patient to make an appointment.       Relevant Medications   PROAIR HFA 108 (90 Base) MCG/ACT inhaler   tiotropium (SPIRIVA HANDIHALER) 18 MCG inhalation capsule     Endocrine   IFG (impaired fasting glucose)    Labs ordered at visit today.  Will make recommendations based on lab results.        Relevant Orders   HgB A1c     Other   Hyperlipidemia    Chronic.  Controlled.  Continue with current medication regimen.  Labs ordered today.  Return to clinic in 3 months for reevaluation.  Call sooner if concerns arise.        Relevant Medications   amLODipine (NORVASC) 10 MG tablet   atorvastatin (LIPITOR) 40 MG tablet   cloNIDine  (CATAPRES) 0.1 MG tablet   lisinopril (ZESTRIL) 40 MG tablet   Other Relevant Orders   Lipid Profile   Depression with anxiety    Chronic. Not well controlled. Does not want to take medication at this time.  Has been prescribed Celexa in the past but didn't take it.  Declines referral to psychiatry.       Morbid obesity (HCC)    Recommended eating smaller high protein, low fat meals more frequently and exercising 30 mins a day 5 times a week with a goal of 10-15lb weight loss in the next 3 months.         Follow up plan: Return in about 3 months (around 10/06/2022) for HTN, HLD, DM2 FU.

## 2022-07-06 NOTE — Assessment & Plan Note (Addendum)
Chronic. Does Endorse SOB.  Has not been to Pulmonology for evaluation for oxygen. Patient has not seen Pulmonology.  Is on Oxygen ordered by the hospital.  Will need to see Pulmonology for further evaluation and management of Oxygen.  Encouraged patient to make an appointment.

## 2022-07-06 NOTE — Assessment & Plan Note (Addendum)
Chronic. Has not followed up with HF clinic.  Discussed with patient the importance of taking medications regularly.  On lasix 20mg  daily.  Labs ordered today.  Will needs to keep 3 month follow up for further refills. Not weighing herself daily.  Patient has lower extremity swelling and sleeping with 5 pillows.  Made an appt for patient at the HF clinic for Thursday at 930. - Reminded to call for an overnight weight gain of >2 pounds or a weekly weight gain of >5 pounds - not adding salt to food and read food labels. Reviewed the importance of keeping daily sodium intake to 2000mg  daily. - Avoid Ibuprofen products.

## 2022-07-06 NOTE — Telephone Encounter (Signed)
Requested medication (s) are due for refill today: yes  Requested medication (s) are on the active medication list: yes  Last refill:  10/16/21  Future visit scheduled: yes  Notes to clinic:  Unable to refill per protocol, appointment needed.  Patient no show last several OV, routing for approval.     Requested Prescriptions  Pending Prescriptions Disp Refills   allopurinol (ZYLOPRIM) 300 MG tablet [Pharmacy Med Name: ALLOPURINOL 300 MG TABLET] 30 tablet     Sig: TAKE 1 TABLET BY MOUTH EVERY DAY     Endocrinology:  Gout Agents - allopurinol Failed - 07/04/2022  1:36 PM      Failed - Uric Acid in normal range and within 360 days    Uric Acid  Date Value Ref Range Status  07/02/2019 8.2 (H) 3.0 - 7.2 mg/dL Final    Comment:               Therapeutic target for gout patients: <6.0         Passed - Cr in normal range and within 360 days    Creatinine  Date Value Ref Range Status  01/15/2014 1.36 (H) 0.60 - 1.30 mg/dL Final   Creatinine, Ser  Date Value Ref Range Status  01/12/2022 0.84 0.44 - 1.00 mg/dL Final         Passed - Valid encounter within last 12 months    Recent Outpatient Visits           7 months ago Chronic diastolic heart failure (HCC)   [facility] [facility] Larae Grooms, NP   8 months ago Annual physical exam   [place] [facility] Larae Grooms, NP   11 months ago Chronic obstructive pulmonary disease, unspecified COPD type (HCC)   [place] [facility] Larae Grooms, NP   1 year ago Chronic pain of right knee   [place] [facility] Larae Grooms, NP   1 year ago Lymphedema   [place] [facility] Larae Grooms, NP       Future Appointments             Today Larae Grooms, NP [place] [facility]            Passed - CBC within normal limits and completed in the last 12 months    WBC  Date Value Ref Range Status   01/12/2022 5.6 4.0 - 10.5 K/uL Final   RBC  Date Value Ref Range Status  01/12/2022 4.05 3.87 - 5.11 MIL/uL Final   Hemoglobin  Date Value Ref Range Status  01/12/2022 12.1 12.0 - 15.0 g/dL Final  16/10/9602 54.0 11.1 - 15.9 g/dL Final   HCT  Date Value Ref Range Status  01/12/2022 39.2 36.0 - 46.0 % Final   Hematocrit  Date Value Ref Range Status  10/16/2021 42.6 34.0 - 46.6 % Final   MCHC  Date Value Ref Range Status  01/12/2022 30.9 30.0 - 36.0 g/dL Final   [facility]  Date Value Ref Range Status  01/12/2022 29.9 26.0 - 34.0 pg Final   MCV  Date Value Ref Range Status  01/12/2022 96.8 80.0 - 100.0 fL Final  10/16/2021 95 79 - 97 fL Final  01/15/2014 87 80 - 100 fL Final   No results found for: "PLTCOUNTKUC", "LABPLAT", "POCPLA" RDW  Date Value Ref Range Status  01/12/2022 14.2 11.5 - 15.5 % Final  10/16/2021 14.0 11.7 - 15.4 % Final  01/15/2014 16.0 (  H) 11.5 - 14.5 % Final          citalopram (CELEXA) 10 MG tablet [Pharmacy Med Name: CITALOPRAM HBR 10 MG TABLET] 90 tablet 1    Sig: TAKE 1 TABLET BY MOUTH EVERY DAY     Psychiatry:  Antidepressants - SSRI Failed - 07/04/2022  1:36 PM      Failed - Valid encounter within last 6 months    Recent Outpatient Visits           7 months ago Chronic diastolic heart failure (HCC)   [place] [facility] Larae Grooms, NP   8 months ago Annual physical exam   [place] [facility] Larae Grooms, NP   11 months ago Chronic obstructive pulmonary disease, unspecified COPD type [facility])   [place] [facility] Larae Grooms, NP   1 year ago Chronic pain of right knee   [place] [facility] Larae Grooms, NP   1 year ago Lymphedema   [place] [facility] Larae Grooms, NP       Future Appointments             Today Larae Grooms, NP [place] [facility]            Passed - Completed PHQ-2 or PHQ-9  in the last 360 days       pantoprazole (PROTONIX) 40 MG tablet [Pharmacy Med Name: PANTOPRAZOLE SOD DR 40 MG TAB] 90 tablet 0    Sig: TAKE 1 TABLET BY MOUTH EVERY DAY     Gastroenterology: Proton Pump Inhibitors Passed - 07/04/2022  1:36 PM      Passed - Valid encounter within last 12 months    Recent Outpatient Visits           7 months ago Chronic diastolic heart failure (HCC)   [place] [facility] Larae Grooms, NP   8 months ago Annual physical exam   [place] [facility] Larae Grooms, NP   11 months ago Chronic obstructive pulmonary disease, unspecified COPD type [facility])   [place] [facility] Larae Grooms, NP   1 year ago Chronic pain of right knee   [place] [facility] Larae Grooms, NP   1 year ago Lymphedema   [place] [facility] Larae Grooms, NP       Future Appointments             Today Larae Grooms, NP [place] [facility]

## 2022-07-06 NOTE — Assessment & Plan Note (Signed)
Chronic. Not well controlled. Does not want to take medication at this time.  Has been prescribed Celexa in the past but didn't take it.  Declines referral to psychiatry.

## 2022-07-06 NOTE — Assessment & Plan Note (Addendum)
Chronic.  Uncontrolled.  Continue with current medication regimen of Amlodipine, Clonidine, Metoprolol and Lisinopril.  Admits to noncompliance with medications.  Encouraged patient to take medications as prescribed.  Refills only sent for 3 months due to non compliance with appointments.  Will need to keep appointments for further refills. Labs ordered today.  Return to clinic in 3 months for reevaluation.  Call sooner if concerns arise.

## 2022-07-06 NOTE — Assessment & Plan Note (Signed)
Recommended eating smaller high protein, low fat meals more frequently and exercising 30 mins a day 5 times a week with a goal of 10-15lb weight loss in the next 3 months.  

## 2022-07-06 NOTE — Assessment & Plan Note (Signed)
Labs ordered at visit today.  Will make recommendations based on lab results.   

## 2022-07-06 NOTE — Assessment & Plan Note (Signed)
Chronic.  Controlled.  Continue with current medication regimen.  Labs ordered today.  Return to clinic in 3 months for reevaluation.  Call sooner if concerns arise.   

## 2022-07-07 ENCOUNTER — Telehealth: Payer: Self-pay | Admitting: *Deleted

## 2022-07-07 LAB — COMPREHENSIVE METABOLIC PANEL
ALT: 9 IU/L (ref 0–32)
AST: 14 IU/L (ref 0–40)
Albumin/Globulin Ratio: 1.6
Albumin: 3.9 g/dL (ref 3.8–4.9)
Alkaline Phosphatase: 78 IU/L (ref 44–121)
BUN/Creatinine Ratio: 12 (ref 9–23)
BUN: 9 mg/dL (ref 6–24)
Bilirubin Total: 0.6 mg/dL (ref 0.0–1.2)
CO2: 25 mmol/L (ref 20–29)
Calcium: 9 mg/dL (ref 8.7–10.2)
Chloride: 104 mmol/L (ref 96–106)
Creatinine, Ser: 0.76 mg/dL (ref 0.57–1.00)
Globulin, Total: 2.4 g/dL (ref 1.5–4.5)
Glucose: 88 mg/dL (ref 70–99)
Potassium: 3.3 mmol/L — ABNORMAL LOW (ref 3.5–5.2)
Sodium: 144 mmol/L (ref 134–144)
Total Protein: 6.3 g/dL (ref 6.0–8.5)
eGFR: 92 mL/min/{1.73_m2} (ref >59)

## 2022-07-07 LAB — LIPID PANEL
Chol/HDL Ratio: 3.7 ratio (ref 0.0–4.4)
Cholesterol, Total: 196 mg/dL (ref 100–199)
HDL: 53 mg/dL (ref >39)
LDL Chol Calc (NIH): 121 mg/dL — ABNORMAL HIGH (ref 0–99)
Triglycerides: 124 mg/dL (ref 0–149)
VLDL Cholesterol Cal: 22 mg/dL (ref 5–40)

## 2022-07-07 LAB — HEMOGLOBIN A1C
Est. average glucose Bld gHb Est-mCnc: 123 mg/dL
Hgb A1c MFr Bld: 5.9 % — ABNORMAL HIGH (ref 4.8–5.6)

## 2022-07-07 NOTE — Progress Notes (Signed)
Please let patient know that her lab work looks good.  Her A1c is well controlled at 5.9%. Please remind her of her appointment with Clarisa Kindred tomorrow at 930.  I highly encourage her to keep this due to her recent symptoms.  No concerns at this time.  Continue with current medication regimen.  Follow up as discussed.

## 2022-07-07 NOTE — Telephone Encounter (Signed)
  Chief Complaint: Results Symptoms: NA Frequency: NA Pertinent Negatives: Patient denies NA Disposition: [] ED /[] Urgent Care (no appt availability in office) / [] Appointment(In office/virtual)/ []  [place] [facility]/ [] Home Care/ [] Refused Recommended Disposition /[] [place] [facility]/ []  Follow-up with PCP Additional Notes:  Result note read to pt, verbalizes understanding. States she will indeed keep the appt tomorrow.  She would like Clydie Braun to know she picked up her meds this AM and has started taking.     Please let patient know that her lab work looks good.  Her A1c is well controlled at 5.9%. Please remind her of her appointment with Clarisa Kindred tomorrow at 930.  I highly encourage her to keep this due to her recent symptoms.  No concerns at this time.  Continue with current medication regimen.  Follow up as discussed.

## 2022-07-07 NOTE — Telephone Encounter (Signed)
Requested Prescriptions  Pending Prescriptions Disp Refills   gabapentin (NEURONTIN) 300 MG capsule [Pharmacy Med Name: GABAPENTIN 300 MG CAPSULE] 180 capsule 0    Sig: TAKE 2 CAPSULES (600 MG TOTAL) BY MOUTH AT BEDTIME.     Neurology: Anticonvulsants - gabapentin Passed - 07/06/2022  9:40 AM      Passed - Cr in normal range and within 360 days    Creatinine  Date Value Ref Range Status  01/15/2014 1.36 (H) 0.60 - 1.30 mg/dL Final   Creatinine, Ser  Date Value Ref Range Status  07/06/2022 0.76 0.57 - 1.00 mg/dL Final         Passed - Completed PHQ-2 or PHQ-9 in the last 360 days      Passed - Valid encounter within last 12 months    Recent Outpatient Visits           Yesterday Essential hypertension   [place] [facility] Larae Grooms, NP   7 months ago Chronic diastolic heart failure [facility])   [place] [facility] Larae Grooms, NP   8 months ago Annual physical exam   [place] [facility] Larae Grooms, NP   11 months ago Chronic obstructive pulmonary disease, unspecified COPD type [facility])   [place] [facility] Larae Grooms, NP   1 year ago Chronic pain of right knee   [place] [facility] Larae Grooms, NP       Future Appointments             In 3 months Mecum, Oswaldo Conroy, PA-C [place] [facility]

## 2022-07-08 ENCOUNTER — Encounter: Payer: Self-pay | Admitting: Family

## 2022-07-08 ENCOUNTER — Ambulatory Visit: Payer: Medicaid Other | Attending: Family | Admitting: Family

## 2022-07-08 VITALS — BP 162/122 | HR 75 | Wt 238.4 lb

## 2022-07-08 DIAGNOSIS — Z79899 Other long term (current) drug therapy: Secondary | ICD-10-CM | POA: Insufficient documentation

## 2022-07-08 DIAGNOSIS — I509 Heart failure, unspecified: Secondary | ICD-10-CM | POA: Insufficient documentation

## 2022-07-08 DIAGNOSIS — M109 Gout, unspecified: Secondary | ICD-10-CM | POA: Diagnosis not present

## 2022-07-08 DIAGNOSIS — I428 Other cardiomyopathies: Secondary | ICD-10-CM | POA: Insufficient documentation

## 2022-07-08 DIAGNOSIS — F419 Anxiety disorder, unspecified: Secondary | ICD-10-CM | POA: Diagnosis not present

## 2022-07-08 DIAGNOSIS — Z86711 Personal history of pulmonary embolism: Secondary | ICD-10-CM | POA: Diagnosis not present

## 2022-07-08 DIAGNOSIS — F32A Depression, unspecified: Secondary | ICD-10-CM | POA: Insufficient documentation

## 2022-07-08 DIAGNOSIS — I10 Essential (primary) hypertension: Secondary | ICD-10-CM | POA: Diagnosis not present

## 2022-07-08 DIAGNOSIS — I110 Hypertensive heart disease with heart failure: Secondary | ICD-10-CM | POA: Insufficient documentation

## 2022-07-08 DIAGNOSIS — E785 Hyperlipidemia, unspecified: Secondary | ICD-10-CM | POA: Diagnosis not present

## 2022-07-08 DIAGNOSIS — J449 Chronic obstructive pulmonary disease, unspecified: Secondary | ICD-10-CM | POA: Diagnosis not present

## 2022-07-08 DIAGNOSIS — Z8673 Personal history of transient ischemic attack (TIA), and cerebral infarction without residual deficits: Secondary | ICD-10-CM | POA: Insufficient documentation

## 2022-07-08 DIAGNOSIS — F329 Major depressive disorder, single episode, unspecified: Secondary | ICD-10-CM

## 2022-07-08 DIAGNOSIS — I5032 Chronic diastolic (congestive) heart failure: Secondary | ICD-10-CM

## 2022-07-08 DIAGNOSIS — G4733 Obstructive sleep apnea (adult) (pediatric): Secondary | ICD-10-CM | POA: Diagnosis not present

## 2022-07-08 MED ORDER — SPIRONOLACTONE 25 MG PO TABS: 25.0000 mg | ORAL_TABLET | Freq: Every day | ORAL | 3 refills | Status: AC

## 2022-07-08 NOTE — Progress Notes (Signed)
PCP: Larae Grooms, NP (last seen 06/24) Primary Cardiologist: Dorothyann Peng, MD (last seen 02/22)  HPI:  [name] is a 57 y/o female with a history of pulmonary embolus, obstructive sleep apnea, atrial fibrillation, COPD, gout, HTN, CVA, hyperlipidemia, depression, anxiety, OSA, remote tobacco use and chronic heart failure.   Echo 03/26/2018: EF of 60-65% along with mild/ moderate TR. Echo 06/17/16: EF of 55-65%. Had a pharmacologic stress trest done 09/12/12 and had an estimated EF of 49%.   Catheterization 03/27/2018:  normal coronary arteries and normal left ventricular function.  Admitted 01/11/22 due to productive cough with greenish colored sputum, shortness of breath, fever, chills, malaise, sore throat, body aches. Denies chest pain. Patient also reports nausea, vomiting, diarrhea and abdominal pain. GI pathogen panel negative but C. difficile antigen positive with negative toxin with reflex to PCR which came back positive. Influenza A PCR positive.   She presents today for a follow-up visit with a chief complaint of moderate SOB with minimal exertion. Chronic in nature although worsening over the last several days. Now sleeping on 4 pillows due to her SOB. Also has moderate fatigue, intermittent chest pain, palpitations, and ankle swelling for the last 3 days. Denies dizziness, abdominal distention or weight gain.   Reports having all her medications, not missing any doses and taking her meds this morning before her appointment.   ROS: All systems negative except as listed in HPI, PMH and Problem List.  SH:  Social History   Socioeconomic History   Marital status: Legally Separated    Spouse name: Not on file   Number of children: Not on file   Years of education: Not on file   Highest education level: Not on file  Occupational History   Not on file  Tobacco Use   Smoking status: Former    Types: Cigarettes    Quit date: 01/26/2016    Years since quitting: 6.4   Smokeless  tobacco: Never  Vaping Use   Vaping Use: Never used  Substance and Sexual Activity   Alcohol use: Yes    Comment: Occassionally   Drug use: No   Sexual activity: Never  Other Topics Concern   Not on file  Social History Narrative   Not on file   Social Determinants of Health   Financial Resource Strain: Not on file  Food Insecurity: No Food Insecurity (01/11/2022)   Hunger Vital Sign    Worried About Running Out of Food in the Last Year: Never true    Ran Out of Food in the Last Year: Never true  Transportation Needs: No Transportation Needs (01/11/2022)   PRAPARE - Administrator, Civil Service (Medical): No    Lack of Transportation (Non-Medical): No  Physical Activity: Not on file  Stress: Not on file  Social Connections: Not on file  Intimate Partner Violence: Not At Risk (01/11/2022)   Humiliation, Afraid, Rape, and Kick questionnaire    Fear of Current or Ex-Partner: No    Emotionally Abused: No    Physically Abused: No    Sexually Abused: No    FH:  Family History  Problem Relation Age of Onset   Prostate cancer Father    Rectal cancer Sister    Breast cancer Sister    Cancer Maternal Grandmother    Cancer Maternal Grandfather     Past Medical History:  Diagnosis Date   A-fib [facility]) 2010   Adrenal adenoma, left 10/06/2004   a.) CT 10/06/2004 --> measured 3.1  x 1.6 cm   Alcohol abuse    Allergy    Anginal pain (HCC)    Anxiety    Cardiac murmur    CHF (congestive heart failure) (HCC)    COPD (chronic obstructive pulmonary disease) (HCC)    Diverticulosis    Fatty liver    GERD (gastroesophageal reflux disease)    Gout    History of cocaine abuse (HCC)    a.) reports that use discontinued following CVA in 2010.   History of kidney stones    Hypertension    Mild cognitive impairment    Non-healing non-surgical wound    right leg   Obesity    On supplemental oxygen therapy    a.) 2 L/[place]   OSA on CPAP    Pulmonary embolus (HCC)  02/19/2012   a.) CTA --> RIGHT upper lobe.   Stroke [facility]) 2010   Thoracic ascending aortic aneurysm (HCC)    a.) CT 10/28/2019 --> measured 4.6 cm.   Thyroid disease    Tricuspid valvular regurgitation    a.) TTE 03/26/2018 --> mild to moderate.    Current Outpatient Medications  Medication Sig Dispense Refill   allopurinol (ZYLOPRIM) 300 MG tablet Take 1 tablet (300 mg total) by mouth daily. 90 tablet 1   amLODipine (NORVASC) 10 MG tablet Take 1 tablet (10 mg total) by mouth daily. 90 tablet 0   aspirin EC 81 MG tablet Take 1 tablet (81 mg total) by mouth daily. 30 tablet 1   atorvastatin (LIPITOR) 40 MG tablet Take 1 tablet (40 mg total) by mouth daily. 90 tablet 0   Cholecalciferol (VITAMIN D3) 25 MCG (1000 UT) CAPS Take 1 capsule (1,000 Units total) by mouth daily. 90 capsule 1   cloNIDine (CATAPRES) 0.1 MG tablet Take 1 tablet (0.1 mg total) by mouth daily. 90 tablet 0   gabapentin (NEURONTIN) 300 MG capsule TAKE 2 CAPSULES (600 MG TOTAL) BY MOUTH AT BEDTIME. 180 capsule 0   hydrALAZINE (APRESOLINE) 100 MG tablet TAKE 1 TABLET BY MOUTH 3 TIMES DAILY. 270 tablet 0   lisinopril (ZESTRIL) 40 MG tablet Take 1 tablet (40 mg total) by mouth daily. 90 tablet 0   metoprolol tartrate (LOPRESSOR) 25 MG tablet Take 1 tablet (25 mg total) by mouth 2 (two) times daily. 60 tablet 0   pantoprazole (PROTONIX) 40 MG tablet Take 1 tablet (40 mg total) by mouth daily. 90 tablet 0   PROAIR HFA 108 (90 Base) MCG/ACT inhaler INHALE TWO (2) PUFFS BY MOUTH EVERY 6 HOURS AS NEEDED FOR WHEEZING OR FOR SHORTNESS OF BREATH 8.5 g 0   tiotropium (SPIRIVA HANDIHALER) 18 MCG inhalation capsule Place 1 capsule (18 mcg total) into inhaler and inhale daily. 30 capsule 1   No current facility-administered medications for this visit.   Vitals:   07/08/22 0901  BP: (!) 162/122  Pulse: 75  SpO2: 100%  Weight: 238 lb 6.4 oz (108.1 kg)   Wt Readings from Last 3 Encounters:  07/08/22 238 lb 6.4 oz (108.1 kg)   07/06/22 239 lb 9.6 oz (108.7 kg)  01/12/22 220 lb 14.4 oz (100.2 kg)   Lab Results  Component Value Date   CREATININE 0.76 07/06/2022   CREATININE 0.84 01/12/2022   CREATININE 0.90 01/11/2022   PHYSICAL EXAM:  General:  Well appearing. No resp difficulty HEENT: normal Neck: supple. JVP flat. No lymphadenopathy or thryomegaly appreciated. Cor: PMI normal. Regular rate & rhythm. No rubs, gallops or murmurs. Lungs: clear Abdomen: soft, nontender, nondistended.  No hepatosplenomegaly. No bruits or masses.  Extremities: no cyanosis, clubbing, rash, 1+ pitting edema bilateral lower legs Neuro: alert & orientedx3, cranial nerves grossly intact. Moves all 4 extremities w/o difficulty. Affect pleasant.   ECG: NSR  Reds: 34%   ASSESSMENT & PLAN:  1: NICM with preserved ejection fraction- - likely due to HTN/ OSA as cath in 2020 was without CAD - NYHA class III - minimally fluid overloaded today - not weighing daily; reminded to call for an overnight weight gain of >2 pounds or a weekly weight gain of >5 pounds.  - weight up 14 pounds from last visit here 9 months ago - Echo 03/26/2018: EF of 60-65% along with mild/ moderate TR. Echo 06/17/16: EF of 55-65%.  - updated echo schedule for 07/22/22 - Catheterization 03/27/2018:  normal coronary arteries and normal left ventricular function. - not adding salt to her food. Reminded to closely follow a 2000mg  sodium diet  - saw cardiology [name]) 02/22; number provided on AVS for her to call and get an appointment scheduled - continue lisinopril 40mg  daily; consider changing to entresto - continue metoprolol tartrate 25mg  BID - add spironolactone 25mg  daily - BMP next week - BNP 09/01/21 was 135.3  2: HTN- - BP elevated & she says that she's taken her meds this morning - adding spironolactone per above - may need referral to HTN clinic - continue amlodipine 10mg  daily - continue clonidine 0.1mg  daily - continue hydralazine 100mg  TID -  continue lisinopril 40mg  daily - saw PCP Caren Griffins) 06/24 - reviewed BMP done 07/06/22 and it showed sodium 144, potassium 3.3, creatinine 0.76 and GFR 92  3: Obstructive sleep apnea- - currently not sleeping well due to SOB - does not have oxygen and hasn't seen pulmonology  4: Depression/ anxiety- - continues to struggle with depression/ anxiety   Return in 1 month, sooner if needed

## 2022-07-08 NOTE — Patient Instructions (Addendum)
  Please call Dr. Glennis Brink office at [phone] to schedule an appointment.   Medication Changes:  Start Spironolactone 25 mg (1 tablet) daily.  Lab Work:  Labs done next week.  your results will be available in MyChart, we will contact you for abnormal readings.   Testing/Procedures:  Your physician has requested that you have an echocardiogram. Echocardiography is a painless test that uses sound waves to create images of your heart. It provides your doctor with information about the size and shape of your heart and how well your heart's chambers and valves are working. This procedure takes approximately one hour. There are no restrictions for this procedure. Please do NOT wear cologne, perfume, aftershave, or lotions (deodorant is allowed). Please arrive 15 minutes prior to your appointment time.   Special Instructions // Education:  Do the following things EVERYDAY: Weigh yourself in the morning before breakfast. Write it down and keep it in a log. Take your medicines as prescribed Eat low salt foods--Limit salt (sodium) to 2000 mg per day.  Stay as active as you can everyday Limit all fluids for the day to less than 2 liters   Follow-Up in: follow up with Clarisa Kindred, FNP after your ECHO.    If you have any questions or concerns before your next appointment please send [place] a message through [place] or call our office at [phone] Monday-Friday 8 am-5 pm.   If you have an urgent need after hours on the weekend please call your Primary Cardiologist or the Advanced [facility] Failure Clinic in [place] at [phone].

## 2022-07-22 ENCOUNTER — Ambulatory Visit: Admission: RE | Admit: 2022-07-22 | Payer: Medicaid Other | Source: Ambulatory Visit

## 2022-07-27 ENCOUNTER — Other Ambulatory Visit
Admission: RE | Admit: 2022-07-27 | Discharge: 2022-07-27 | Disposition: A | Discharge status: Home / Self Care | Payer: Medicaid Other | Source: Ambulatory Visit | Attending: Family | Admitting: Family

## 2022-07-27 ENCOUNTER — Ambulatory Visit (HOSPITAL_BASED_OUTPATIENT_CLINIC_OR_DEPARTMENT_OTHER): Payer: Medicaid Other | Admitting: Family

## 2022-07-27 ENCOUNTER — Encounter: Payer: Self-pay | Admitting: Family

## 2022-07-27 VITALS — BP 156/122 | HR 84 | Resp 14 | Wt 229.0 lb

## 2022-07-27 DIAGNOSIS — F329 Major depressive disorder, single episode, unspecified: Secondary | ICD-10-CM

## 2022-07-27 DIAGNOSIS — I5032 Chronic diastolic (congestive) heart failure: Secondary | ICD-10-CM

## 2022-07-27 DIAGNOSIS — I10 Essential (primary) hypertension: Secondary | ICD-10-CM

## 2022-07-27 DIAGNOSIS — G4733 Obstructive sleep apnea (adult) (pediatric): Secondary | ICD-10-CM

## 2022-07-27 LAB — BASIC METABOLIC PANEL
Anion gap: 11 (ref 5–15)
BUN: 11 mg/dL (ref 6–20)
CO2: 26 mmol/L (ref 22–32)
Calcium: 9 mg/dL (ref 8.9–10.3)
Chloride: 105 mmol/L (ref 98–111)
Creatinine, Ser: 0.71 mg/dL (ref 0.44–1.00)
GFR, Estimated: >60 mL/min (ref >60)
Glucose, Bld: 106 mg/dL — ABNORMAL HIGH (ref 70–99)
Potassium: 3.5 mmol/L (ref 3.5–5.1)
Sodium: 142 mmol/L (ref 135–145)

## 2022-07-27 NOTE — Progress Notes (Signed)
PCP: Larae Grooms, NP (last seen 06/24) Primary Cardiologist: Dorothyann Peng, MD (last seen 02/22)  HPI:  [name] is a 57 y/o female with a history of pulmonary embolus, obstructive sleep apnea, atrial fibrillation, COPD, gout, HTN, CVA, hyperlipidemia, depression, anxiety, OSA, remote tobacco use and chronic heart failure.   Echo 03/26/2018: EF of 60-65% along with mild/ moderate TR. Echo 06/17/16: EF of 55-65%. Had a pharmacologic stress trest done 09/12/12 and had an estimated EF of 49%.   Catheterization 03/27/2018:  normal coronary arteries and normal left ventricular function.  Admitted 01/11/22 due to productive cough with greenish colored sputum, shortness of breath, fever, chills, malaise, sore throat, body aches. Denies chest pain. Patient also reports nausea, vomiting, diarrhea and abdominal pain. GI pathogen panel negative but C. difficile antigen positive with negative toxin with reflex to PCR which came back positive. Influenza A PCR positive.   She presents today for a follow-up visit with a chief complaint of minimal SOB with moderate exertion. Chronic in nature. Denies fatigue, chest pain/ tightness, cough, abdominal distention, pedal edema, dizziness, difficulty sleeping, headaches, blurry vision or difficulty sleeping.   Says that she has been taking all of her medications and hasn't missed any doses. Did take her medication before coming to her appointment today. Says that she used to take furosemide but is unsure as to why she was taken off of it.   Is trying to more closely monitor her diet and eat healthier as well as less amounts so that she can lose some weight.   ROS: All systems negative except as listed in HPI, PMH and Problem List.  SH:  Social History   Socioeconomic History   Marital status: Legally Separated    Spouse name: Not on file   Number of children: Not on file   Years of education: Not on file   Highest education level: Not on file   Occupational History   Not on file  Tobacco Use   Smoking status: Former    Types: Cigarettes    Quit date: 01/26/2016    Years since quitting: 6.5   Smokeless tobacco: Never  Vaping Use   Vaping Use: Never used  Substance and Sexual Activity   Alcohol use: Yes    Comment: Occassionally   Drug use: No   Sexual activity: Never  Other Topics Concern   Not on file  Social History Narrative   Not on file   Social Determinants of Health   Financial Resource Strain: Not on file  Food Insecurity: No Food Insecurity (01/11/2022)   Hunger Vital Sign    Worried About Running Out of Food in the Last Year: Never true    Ran Out of Food in the Last Year: Never true  Transportation Needs: No Transportation Needs (01/11/2022)   PRAPARE - Administrator, Civil Service (Medical): No    Lack of Transportation (Non-Medical): No  Physical Activity: Not on file  Stress: Not on file  Social Connections: Not on file  Intimate Partner Violence: Not At Risk (01/11/2022)   Humiliation, Afraid, Rape, and Kick questionnaire    Fear of Current or Ex-Partner: No    Emotionally Abused: No    Physically Abused: No    Sexually Abused: No    FH:  Family History  Problem Relation Age of Onset   Prostate cancer Father    Rectal cancer Sister    Breast cancer Sister    Cancer Maternal Grandmother    Cancer  Maternal Grandfather     Past Medical History:  Diagnosis Date   A-fib [facility]) 2010   Adrenal adenoma, left 10/06/2004   a.) CT 10/06/2004 --> measured 3.1 x 1.6 cm   Alcohol abuse    Allergy    Anginal pain (HCC)    Anxiety    Cardiac murmur    CHF (congestive heart failure) (HCC)    COPD (chronic obstructive pulmonary disease) (HCC)    Diverticulosis    Fatty liver    GERD (gastroesophageal reflux disease)    Gout    History of cocaine abuse (HCC)    a.) reports that use discontinued following CVA in 2010.   History of kidney stones    Hypertension    Mild cognitive  impairment    Non-healing non-surgical wound    right leg   Obesity    On supplemental oxygen therapy    a.) 2 L/[place]   OSA on CPAP    Pulmonary embolus (HCC) 02/19/2012   a.) CTA --> RIGHT upper lobe.   Stroke [facility]) 2010   Thoracic ascending aortic aneurysm (HCC)    a.) CT 10/28/2019 --> measured 4.6 cm.   Thyroid disease    Tricuspid valvular regurgitation    a.) TTE 03/26/2018 --> mild to moderate.    Current Outpatient Medications  Medication Sig Dispense Refill   allopurinol (ZYLOPRIM) 300 MG tablet Take 1 tablet (300 mg total) by mouth daily. 90 tablet 1   amLODipine (NORVASC) 10 MG tablet Take 1 tablet (10 mg total) by mouth daily. 90 tablet 0   aspirin EC 81 MG tablet Take 1 tablet (81 mg total) by mouth daily. 30 tablet 1   atorvastatin (LIPITOR) 40 MG tablet Take 1 tablet (40 mg total) by mouth daily. 90 tablet 0   Cholecalciferol (VITAMIN D3) 25 MCG (1000 UT) CAPS Take 1 capsule (1,000 Units total) by mouth daily. 90 capsule 1   cloNIDine (CATAPRES) 0.1 MG tablet Take 1 tablet (0.1 mg total) by mouth daily. 90 tablet 0   gabapentin (NEURONTIN) 300 MG capsule TAKE 2 CAPSULES (600 MG TOTAL) BY MOUTH AT BEDTIME. 180 capsule 0   hydrALAZINE (APRESOLINE) 100 MG tablet TAKE 1 TABLET BY MOUTH 3 TIMES DAILY. 270 tablet 0   lisinopril (ZESTRIL) 40 MG tablet Take 1 tablet (40 mg total) by mouth daily. 90 tablet 0   metoprolol tartrate (LOPRESSOR) 25 MG tablet Take 1 tablet (25 mg total) by mouth 2 (two) times daily. 60 tablet 0   pantoprazole (PROTONIX) 40 MG tablet Take 1 tablet (40 mg total) by mouth daily. 90 tablet 0   PROAIR HFA 108 (90 Base) MCG/ACT inhaler INHALE TWO (2) PUFFS BY MOUTH EVERY 6 HOURS AS NEEDED FOR WHEEZING OR FOR SHORTNESS OF BREATH (Patient not taking: Reported on 07/08/2022) 8.5 g 0   spironolactone (ALDACTONE) 25 MG tablet Take 1 tablet (25 mg total) by mouth daily. 90 tablet 3   tiotropium (SPIRIVA HANDIHALER) 18 MCG inhalation capsule Place 1 capsule (18 mcg  total) into inhaler and inhale daily. 30 capsule 1   No current facility-administered medications for this visit.   Vitals:   07/27/22 0927 07/27/22 0938  BP: (!) 149/122 (!) 156/122  Pulse: 84   Resp: 14   SpO2: 98%   Weight: 229 lb (103.9 kg)    Wt Readings from Last 3 Encounters:  07/27/22 229 lb (103.9 kg)  07/08/22 238 lb 6.4 oz (108.1 kg)  07/06/22 239 lb 9.6 oz (108.7 kg)  Lab Results  Component Value Date   CREATININE 0.71 07/27/2022   CREATININE 0.76 07/06/2022   CREATININE 0.84 01/12/2022   PHYSICAL EXAM:  General:  Well appearing. No resp difficulty HEENT: normal Neck: supple. JVP flat. No lymphadenopathy or thryomegaly appreciated. Cor: PMI normal. Regular rate & rhythm. No rubs, gallops or murmurs. Lungs: clear Abdomen: soft, nontender, nondistended. No hepatosplenomegaly. No bruits or masses.  Extremities: no cyanosis, clubbing, rash, 1+ pitting edema bilateral lower legs Neuro: alert & orientedx3, cranial nerves grossly intact. Moves all 4 extremities w/o difficulty. Affect pleasant.   ECG: not done   ASSESSMENT & PLAN:  1: NICM with preserved ejection fraction- - likely due to HTN/ OSA as cath in 2020 was without CAD - NYHA class II - euvolemic today - not weighing daily; reminded to call for an overnight weight gain of >2 pounds or a weekly weight gain of >5 pounds.  - weight down 9 pounds from last visit here 3 weeks ago - Echo 03/26/2018: EF of 60-65% along with mild/ moderate TR. Echo 06/17/16: EF of 55-65%.  - updated echo schedule for 07/22/22 but she didn't show up; this has been r/s 08/04/22 - Catheterization 03/27/2018:  normal coronary arteries and normal left ventricular function. - not adding salt to her food. Reminded to closely follow a 2000mg  sodium diet  - saw cardiology Mellissa Kohut) 02/22; - continue lisinopril 40mg  daily; consider changing to entresto - continue metoprolol tartrate 25mg  BID - continue spironolactone 25mg  daily - pending  lab results, will resume furosemide/ potassium but will contact patient after results obtained - BMP today; if start furosemide/ potassium, will check BMP next visit as well - BNP 09/01/21 was 135.3  2: HTN- - BP 149/122; still elevated at recheck with manual cuff - saw PCP Caren Griffins) 06/24 - may need referral to HTN clinic - continue amlodipine 10mg  daily - continue clonidine 0.1mg  daily - continue hydralazine 100mg  TID - continue lisinopril 40mg  daily - BMP done 07/06/22 and it showed sodium 144, potassium 3.3, creatinine 0.76 and GFR 92  3: Obstructive sleep apnea- - reports sleeping well - does not have oxygen and hasn't seen pulmonology  4: Depression/ anxiety- - continues to struggle with depression/ anxiety but overall says that she's feeling better   Return in 1 month, sooner if needed

## 2022-07-27 NOTE — Patient Instructions (Addendum)
[facility] to get your lab work drawn and we will call you once we get those results back  Echocardigram is scheduled for Wednesday, July 10th at 11 AM. Check in at the [facility] at 10:45 am Please do NOT wear cologne, perfume, aftershave, or lotions (deodorant is allowed). Please arrive 15 minutes prior to your appointment time. Echocardiography is a painless test that uses sound waves to create images of your heart. It provides your doctor with information about the size and shape of your heart and how well your heart's chambers and valves are working. This procedure takes approximately one hour. There are no restrictions for this procedure.

## 2022-08-02 ENCOUNTER — Telehealth: Payer: Self-pay

## 2022-08-02 NOTE — Telephone Encounter (Signed)
-----   Message from Electa Sniff, RN sent at 07/27/2022  4:33 PM EDT -----  ----- Message ----- From: Delma Freeze, FNP Sent: 07/27/2022  11:01 AM EDT To: [facility] Triage  Kidney function looks great. Potassium is normal although on the low side of normal Begin furosemide 40mg  once daily and start potassium once daily

## 2022-08-03 ENCOUNTER — Other Ambulatory Visit (HOSPITAL_COMMUNITY): Payer: Self-pay

## 2022-08-03 ENCOUNTER — Other Ambulatory Visit: Payer: Self-pay | Admitting: Nurse Practitioner

## 2022-08-03 ENCOUNTER — Other Ambulatory Visit: Payer: Self-pay

## 2022-08-03 ENCOUNTER — Other Ambulatory Visit: Payer: Self-pay | Admitting: Family

## 2022-08-03 MED ORDER — FUROSEMIDE 40 MG PO TABS: 40.0000 mg | ORAL_TABLET | Freq: Every day | ORAL | 5 refills | Status: DC

## 2022-08-03 MED ORDER — POTASSIUM CHLORIDE CRYS ER 20 MEQ PO TBCR: 20.0000 meq | EXTENDED_RELEASE_TABLET | Freq: Two times a day (BID) | ORAL | 5 refills | Status: DC

## 2022-08-04 ENCOUNTER — Ambulatory Visit: Admission: RE | Admit: 2022-08-04 | Payer: Medicaid Other | Source: Ambulatory Visit

## 2022-08-04 ENCOUNTER — Telehealth: Payer: Self-pay

## 2022-08-04 ENCOUNTER — Other Ambulatory Visit: Payer: Self-pay

## 2022-08-04 DIAGNOSIS — I5032 Chronic diastolic (congestive) heart failure: Secondary | ICD-10-CM

## 2022-08-04 NOTE — Telephone Encounter (Signed)
Requested medication (s) are due for refill today: Yes  Requested medication (s) are on the active medication list: Yes  Last refill:  07/06/22 #90 0RF  Future visit scheduled: Yes  Notes to clinic:  Product Backordered/Unavailable:THIS MEDICATION IS UNAVAILABLE. WOULD YOU LIKE TO CHANGE.      Requested Prescriptions  Pending Prescriptions Disp Refills   lisinopril (ZESTRIL) 40 MG tablet [Pharmacy Med Name: LISINOPRIL 40 MG TABLET] 90 tablet 0    Sig: TAKE 1 TABLET BY MOUTH EVERY DAY     Cardiovascular:  ACE Inhibitors Failed - 08/03/2022  4:10 PM      Failed - Last BP in normal range    BP Readings from Last 1 Encounters:  07/27/22 (!) 156/122         Passed - Cr in normal range and within 180 days    Creatinine  Date Value Ref Range Status  01/15/2014 1.36 (H) 0.60 - 1.30 mg/dL Final   Creatinine, Ser  Date Value Ref Range Status  07/27/2022 0.71 0.44 - 1.00 mg/dL Final         Passed - K in normal range and within 180 days    Potassium  Date Value Ref Range Status  07/27/2022 3.5 3.5 - 5.1 mmol/L Final  01/15/2014 3.9 3.5 - 5.1 mmol/L Final         Passed - Patient is not pregnant      Passed - Valid encounter within last 6 months    Recent Outpatient Visits           4 weeks ago Essential hypertension   [place] [facility] Larae Grooms, NP   7 months ago Chronic diastolic heart failure [facility])   [place] Larae Grooms, NP   9 months ago Annual physical exam   [place] [facility] Larae Grooms, NP   1 year ago Chronic obstructive pulmonary disease, unspecified COPD type [facility])   [place] [facility] Larae Grooms, NP   1 year ago Chronic pain of right knee   [facility] Larae Grooms, NP       Future Appointments             In 2 months Mecum, Oswaldo Conroy, PA-C [facility]

## 2022-08-04 NOTE — Telephone Encounter (Signed)
Call received from [place], who schedules ECHO. Stated needing a new order for ECHO since pt rescheduling and order is expiring.  Order placed.

## 2022-08-06 ENCOUNTER — Telehealth: Payer: Self-pay | Admitting: Nurse Practitioner

## 2022-08-06 MED ORDER — FUROSEMIDE 40 MG PO TABS: 40.0000 mg | ORAL_TABLET | Freq: Every day | ORAL | 5 refills | Status: DC

## 2022-08-06 NOTE — Telephone Encounter (Signed)
Pt states that the medication furosemide (LASIX) 40 MG tablet was prescribed by her heart doctor and it is costing $40 at her pharmacy. Pt states that the medication use to be prescribed from her PCP and it only cost her $4 to get the prescription. Pt is wanting to see if her PCP can prescribe this medication for her again so that she can pay the $4 for it since she cannot afford $40. Please advise.     [id number] - [place], [place] - [address]  Phone: [phone] Fax: [phone]

## 2022-08-24 ENCOUNTER — Ambulatory Visit: Admission: RE | Admit: 2022-08-24 | Payer: Medicaid Other | Source: Ambulatory Visit

## 2022-08-31 ENCOUNTER — Encounter: Payer: Medicaid Other | Admitting: Family

## 2022-08-31 NOTE — Progress Notes (Deleted)
PCP: Larae Grooms, NP (last seen 06/24) Primary Cardiologist: Dorothyann Peng, MD (last seen 02/22)  HPI:  [name] is a 57 y/o female with a history of pulmonary embolus, obstructive sleep apnea, atrial fibrillation, COPD, gout, HTN, CVA, hyperlipidemia, depression, anxiety, OSA, remote tobacco use and chronic heart failure.   Echo 03/26/2018: EF of 60-65% along with mild/ moderate TR. Echo 06/17/16: EF of 55-65%. Had a pharmacologic stress trest done 09/12/12 and had an estimated EF of 49%.   Catheterization 03/27/2018:  normal coronary arteries and normal left ventricular function.  Admitted 01/11/22 due to productive cough with greenish colored sputum, shortness of breath, fever, chills, malaise, sore throat, body aches. Denies chest pain. Patient also reports nausea, vomiting, diarrhea and abdominal pain. GI pathogen panel negative but C. difficile antigen positive with negative toxin with reflex to PCR which came back positive. Influenza A PCR positive.   She presents today for a follow-up visit with a chief complaint of minimal SOB with moderate exertion. Chronic in nature. Denies fatigue, chest pain/ tightness, cough, abdominal distention, pedal edema, dizziness, difficulty sleeping, headaches, blurry vision or difficulty sleeping.   Says that she has been taking all of her medications and hasn't missed any doses. Did take her medication before coming to her appointment today. Says that she used to take furosemide but is unsure as to why she was taken off of it.   Is trying to more closely monitor her diet and eat healthier as well as less amounts so that she can lose some weight.   ROS: All systems negative except as listed in HPI, PMH and Problem List.  SH:  Social History   Socioeconomic History   Marital status: Legally Separated    Spouse name: Not on file   Number of children: Not on file   Years of education: Not on file   Highest education level: Not on file   Occupational History   Not on file  Tobacco Use   Smoking status: Former    Current packs/day: 0.00    Types: Cigarettes    Quit date: 01/26/2016    Years since quitting: 6.6   Smokeless tobacco: Never  Vaping Use   Vaping status: Never Used  Substance and Sexual Activity   Alcohol use: Yes    Comment: Occassionally   Drug use: No   Sexual activity: Never  Other Topics Concern   Not on file  Social History Narrative   Not on file   Social Determinants of Health   Financial Resource Strain: Not on file  Food Insecurity: No Food Insecurity (01/11/2022)   Hunger Vital Sign    Worried About Running Out of Food in the Last Year: Never true    Ran Out of Food in the Last Year: Never true  Transportation Needs: No Transportation Needs (01/11/2022)   PRAPARE - Administrator, Civil Service (Medical): No    Lack of Transportation (Non-Medical): No  Physical Activity: Not on file  Stress: Not on file  Social Connections: Not on file  Intimate Partner Violence: Not At Risk (01/11/2022)   Humiliation, Afraid, Rape, and Kick questionnaire    Fear of Current or Ex-Partner: No    Emotionally Abused: No    Physically Abused: No    Sexually Abused: No    FH:  Family History  Problem Relation Age of Onset   Prostate cancer Father    Rectal cancer Sister    Breast cancer Sister    Cancer  Maternal Grandmother    Cancer Maternal Grandfather     Past Medical History:  Diagnosis Date   A-fib [facility]) 2010   Adrenal adenoma, left 10/06/2004   a.) CT 10/06/2004 --> measured 3.1 x 1.6 cm   Alcohol abuse    Allergy    Anginal pain (HCC)    Anxiety    Cardiac murmur    CHF (congestive heart failure) (HCC)    COPD (chronic obstructive pulmonary disease) (HCC)    Diverticulosis    Fatty liver    GERD (gastroesophageal reflux disease)    Gout    History of cocaine abuse (HCC)    a.) reports that use discontinued following CVA in 2010.   History of kidney stones     Hypertension    Mild cognitive impairment    Non-healing non-surgical wound    right leg   Obesity    On supplemental oxygen therapy    a.) 2 L/[place]   OSA on CPAP    Pulmonary embolus (HCC) 02/19/2012   a.) CTA --> RIGHT upper lobe.   Stroke [facility]) 2010   Thoracic ascending aortic aneurysm (HCC)    a.) CT 10/28/2019 --> measured 4.6 cm.   Thyroid disease    Tricuspid valvular regurgitation    a.) TTE 03/26/2018 --> mild to moderate.    Current Outpatient Medications  Medication Sig Dispense Refill   allopurinol (ZYLOPRIM) 300 MG tablet Take 1 tablet (300 mg total) by mouth daily. 90 tablet 1   amLODipine (NORVASC) 10 MG tablet Take 1 tablet (10 mg total) by mouth daily. 90 tablet 0   aspirin EC 81 MG tablet Take 1 tablet (81 mg total) by mouth daily. 30 tablet 1   atorvastatin (LIPITOR) 40 MG tablet Take 1 tablet (40 mg total) by mouth daily. 90 tablet 0   Cholecalciferol (VITAMIN D3) 25 MCG (1000 UT) CAPS Take 1 capsule (1,000 Units total) by mouth daily. 90 capsule 1   cloNIDine (CATAPRES) 0.1 MG tablet Take 1 tablet (0.1 mg total) by mouth daily. 90 tablet 0   furosemide (LASIX) 40 MG tablet Take 1 tablet (40 mg total) by mouth daily. 30 tablet 5   gabapentin (NEURONTIN) 300 MG capsule TAKE 2 CAPSULES (600 MG TOTAL) BY MOUTH AT BEDTIME. 180 capsule 0   hydrALAZINE (APRESOLINE) 100 MG tablet TAKE 1 TABLET BY MOUTH 3 TIMES DAILY. 270 tablet 0   lisinopril (ZESTRIL) 40 MG tablet Take 1 tablet (40 mg total) by mouth daily. 90 tablet 0   metoprolol tartrate (LOPRESSOR) 25 MG tablet Take 1 tablet (25 mg total) by mouth 2 (two) times daily. 60 tablet 0   pantoprazole (PROTONIX) 40 MG tablet Take 1 tablet (40 mg total) by mouth daily. 90 tablet 0   potassium chloride SA (KLOR-CON M) 20 MEQ tablet Take 1 tablet (20 mEq total) by mouth 2 (two) times daily. 60 tablet 5   PROAIR HFA 108 (90 Base) MCG/ACT inhaler INHALE TWO (2) PUFFS BY MOUTH EVERY 6 HOURS AS NEEDED FOR WHEEZING OR FOR SHORTNESS  OF BREATH (Patient not taking: Reported on 07/08/2022) 8.5 g 0   spironolactone (ALDACTONE) 25 MG tablet Take 1 tablet (25 mg total) by mouth daily. 90 tablet 3   tiotropium (SPIRIVA HANDIHALER) 18 MCG inhalation capsule Place 1 capsule (18 mcg total) into inhaler and inhale daily. 30 capsule 1   No current facility-administered medications for this visit.   There were no vitals filed for this visit.  Wt Readings from Last  3 Encounters:  07/27/22 229 lb (103.9 kg)  07/08/22 238 lb 6.4 oz (108.1 kg)  07/06/22 239 lb 9.6 oz (108.7 kg)   Lab Results  Component Value Date   CREATININE 0.71 07/27/2022   CREATININE 0.76 07/06/2022   CREATININE 0.84 01/12/2022   PHYSICAL EXAM:  General:  Well appearing. No resp difficulty HEENT: normal Neck: supple. JVP flat. No lymphadenopathy or thryomegaly appreciated. Cor: PMI normal. Regular rate & rhythm. No rubs, gallops or murmurs. Lungs: clear Abdomen: soft, nontender, nondistended. No hepatosplenomegaly. No bruits or masses.  Extremities: no cyanosis, clubbing, rash, 1+ pitting edema bilateral lower legs Neuro: alert & orientedx3, cranial nerves grossly intact. Moves all 4 extremities w/o difficulty. Affect pleasant.   ECG: not done   ASSESSMENT & PLAN:  1: NICM with preserved ejection fraction- - likely due to HTN/ OSA as cath in 2020 was without CAD - NYHA class II - euvolemic today - not weighing daily; reminded to call for an overnight weight gain of >2 pounds or a weekly weight gain of >5 pounds.  - weight down 9 pounds from last visit here 3 weeks ago - Echo 03/26/2018: EF of 60-65% along with mild/ moderate TR. Echo 06/17/16: EF of 55-65%.  - updated echo schedule for 07/22/22 but she didn't show up; this has been r/s 08/04/22 - Catheterization 03/27/2018:  normal coronary arteries and normal left ventricular function. - not adding salt to her food. Reminded to closely follow a 2000mg  sodium diet  - saw cardiology Mellissa Kohut) 02/22; -  continue lisinopril 40mg  daily; consider changing to entresto - continue metoprolol tartrate 25mg  BID - continue spironolactone 25mg  daily - pending lab results, will resume furosemide/ potassium but will contact patient after results obtained - BMP today; if start furosemide/ potassium, will check BMP next visit as well - BNP 09/01/21 was 135.3  2: HTN- - BP 149/122; still elevated at recheck with manual cuff - saw PCP Caren Griffins) 06/24 - may need referral to HTN clinic - continue amlodipine 10mg  daily - continue clonidine 0.1mg  daily - continue hydralazine 100mg  TID - continue lisinopril 40mg  daily - BMP done 07/06/22 and it showed sodium 144, potassium 3.3, creatinine 0.76 and GFR 92  3: Obstructive sleep apnea- - reports sleeping well - does not have oxygen and hasn't seen pulmonology  4: Depression/ anxiety- - continues to struggle with depression/ anxiety but overall says that she's feeling better   Return in 1 month, sooner if needed

## 2022-09-21 ENCOUNTER — Ambulatory Visit
Admission: RE | Admit: 2022-09-21 | Discharge: 2022-09-21 | Disposition: A | Discharge status: Home / Self Care | Payer: Medicaid Other | Source: Ambulatory Visit | Attending: Family | Admitting: Family

## 2022-09-21 DIAGNOSIS — J449 Chronic obstructive pulmonary disease, unspecified: Secondary | ICD-10-CM | POA: Diagnosis not present

## 2022-09-21 DIAGNOSIS — I5032 Chronic diastolic (congestive) heart failure: Secondary | ICD-10-CM | POA: Insufficient documentation

## 2022-09-21 DIAGNOSIS — E785 Hyperlipidemia, unspecified: Secondary | ICD-10-CM | POA: Diagnosis not present

## 2022-09-21 DIAGNOSIS — Z8673 Personal history of transient ischemic attack (TIA), and cerebral infarction without residual deficits: Secondary | ICD-10-CM | POA: Diagnosis not present

## 2022-09-21 DIAGNOSIS — I4891 Unspecified atrial fibrillation: Secondary | ICD-10-CM | POA: Insufficient documentation

## 2022-09-21 DIAGNOSIS — I110 Hypertensive heart disease with heart failure: Secondary | ICD-10-CM | POA: Insufficient documentation

## 2022-09-21 DIAGNOSIS — I209 Angina pectoris, unspecified: Secondary | ICD-10-CM | POA: Diagnosis not present

## 2022-09-21 LAB — ECHOCARDIOGRAM COMPLETE
AR max vel: 3.14 cm2
AV Area VTI: 3.36 cm2
AV Area mean vel: 3.57 cm2
AV Mean grad: 3.3 mmHg
AV Peak grad: 8 mmHg
Ao pk vel: 1.41 m/s
Area-P 1/2: 3.68 cm2
MV VTI: 4.32 cm2
S' Lateral: 3.4 cm

## 2022-09-21 NOTE — Progress Notes (Signed)
*  PRELIMINARY RESULTS* Echocardiogram 2D Echocardiogram has been performed.  Carolyne Fiscal 09/21/2022, 12:16 PM

## 2022-09-24 ENCOUNTER — Encounter: Payer: Self-pay | Admitting: Family

## 2022-09-24 ENCOUNTER — Other Ambulatory Visit
Admission: RE | Admit: 2022-09-24 | Discharge: 2022-09-24 | Disposition: A | Discharge status: Home / Self Care | Payer: Medicaid Other | Source: Ambulatory Visit | Attending: Family | Admitting: Family

## 2022-09-24 ENCOUNTER — Ambulatory Visit: Payer: Medicaid Other | Admitting: Family

## 2022-09-24 VITALS — BP 117/84 | HR 75 | Ht 70.0 in | Wt 234.0 lb

## 2022-09-24 DIAGNOSIS — F329 Major depressive disorder, single episode, unspecified: Secondary | ICD-10-CM | POA: Diagnosis not present

## 2022-09-24 DIAGNOSIS — G4733 Obstructive sleep apnea (adult) (pediatric): Secondary | ICD-10-CM | POA: Diagnosis not present

## 2022-09-24 DIAGNOSIS — I7121 Aneurysm of the ascending aorta, without rupture: Secondary | ICD-10-CM

## 2022-09-24 DIAGNOSIS — I10 Essential (primary) hypertension: Secondary | ICD-10-CM

## 2022-09-24 DIAGNOSIS — I5032 Chronic diastolic (congestive) heart failure: Secondary | ICD-10-CM | POA: Diagnosis not present

## 2022-09-24 LAB — BASIC METABOLIC PANEL
Anion gap: 12 (ref 5–15)
BUN: 12 mg/dL (ref 6–20)
CO2: 29 mmol/L (ref 22–32)
Calcium: 9 mg/dL (ref 8.9–10.3)
Chloride: 99 mmol/L (ref 98–111)
Creatinine, Ser: 0.73 mg/dL (ref 0.44–1.00)
GFR, Estimated: >60 mL/min (ref >60)
Glucose, Bld: 115 mg/dL — ABNORMAL HIGH (ref 70–99)
Potassium: 3.1 mmol/L — ABNORMAL LOW (ref 3.5–5.1)
Sodium: 140 mmol/L (ref 135–145)

## 2022-09-24 NOTE — Patient Instructions (Signed)
Go to [facility] to get your lab work drawn.  

## 2022-09-24 NOTE — Progress Notes (Signed)
PCP: Larae Grooms, NP (last seen 06/24) Primary Cardiologist: Dorothyann Peng, MD (last seen 02/22)  HPI:  [name] is a 57 y/o female with a history of pulmonary embolus, obstructive sleep apnea, atrial fibrillation, COPD, gout, HTN, CVA, hyperlipidemia, depression, anxiety, OSA, remote tobacco use and chronic heart failure.   Admitted 01/11/22 due to productive cough with greenish colored sputum, shortness of breath, fever, chills, malaise, sore throat, body aches. Denies chest pain. Patient also reports nausea, vomiting, diarrhea and abdominal pain. GI pathogen panel negative but C. difficile antigen positive with negative toxin with reflex to PCR which came back positive. Influenza A PCR positive.   Had a pharmacologic stress trest done 09/12/12 and had an estimated EF of 49%.   Echo 06/17/16: EF of 55-65%. Echo 03/26/2018: EF of 60-65% along with mild/ moderate TR.   Echo 09/21/22: EF 55-60% with moderate LVH, Grade II DD and mild dilatation of the aortic root, measuring 44 mm. There is mild dilatation of the ascending aorta, measuring 41 mm. There is mild dilatation of the aortic arch, measuring 39 mm.   Catheterization 03/27/2018:  normal coronary arteries and normal left ventricular function.  She presents today for a follow-up visit with a chief complaint of minimal fatigue with moderate exertion. Chronic in nature. Has associated dry cough along with this. She feels like her cough has worsened since she has been out of her albuterol inhaler. She denies shortness of breath, chest pain, palpitations, abdominal distention, pedal edema, dizziness or difficulty sleeping. Overall is feeling well and says that she's been taking her medications.     ROS: All systems negative except as listed in HPI, PMH and Problem List.  SH:  Social History   Socioeconomic History   Marital status: Legally Separated    Spouse name: Not on file   Number of children: Not on file   Years of education: Not  on file   Highest education level: Not on file  Occupational History   Not on file  Tobacco Use   Smoking status: Former    Current packs/day: 0.00    Types: Cigarettes    Quit date: 01/26/2016    Years since quitting: 6.6   Smokeless tobacco: Never  Vaping Use   Vaping status: Never Used  Substance and Sexual Activity   Alcohol use: Yes    Comment: Occassionally   Drug use: No   Sexual activity: Never  Other Topics Concern   Not on file  Social History Narrative   Not on file   Social Determinants of Health   Financial Resource Strain: Not on file  Food Insecurity: No Food Insecurity (01/11/2022)   Hunger Vital Sign    Worried About Running Out of Food in the Last Year: Never true    Ran Out of Food in the Last Year: Never true  Transportation Needs: No Transportation Needs (01/11/2022)   PRAPARE - Administrator, Civil Service (Medical): No    Lack of Transportation (Non-Medical): No  Physical Activity: Not on file  Stress: Not on file  Social Connections: Not on file  Intimate Partner Violence: Not At Risk (01/11/2022)   Humiliation, Afraid, Rape, and Kick questionnaire    Fear of Current or Ex-Partner: No    Emotionally Abused: No    Physically Abused: No    Sexually Abused: No    FH:  Family History  Problem Relation Age of Onset   Prostate cancer Father    Rectal cancer Sister  Breast cancer Sister    Cancer Maternal Grandmother    Cancer Maternal Grandfather     Past Medical History:  Diagnosis Date   A-fib [facility]) 2010   Adrenal adenoma, left 10/06/2004   a.) CT 10/06/2004 --> measured 3.1 x 1.6 cm   Alcohol abuse    Allergy    Anginal pain (HCC)    Anxiety    Cardiac murmur    CHF (congestive heart failure) (HCC)    COPD (chronic obstructive pulmonary disease) (HCC)    Diverticulosis    Fatty liver    GERD (gastroesophageal reflux disease)    Gout    History of cocaine abuse (HCC)    a.) reports that use discontinued following  CVA in 2010.   History of kidney stones    Hypertension    Mild cognitive impairment    Non-healing non-surgical wound    right leg   Obesity    On supplemental oxygen therapy    a.) 2 L/[place]   OSA on CPAP    Pulmonary embolus (HCC) 02/19/2012   a.) CTA --> RIGHT upper lobe.   Stroke [facility]) 2010   Thoracic ascending aortic aneurysm (HCC)    a.) CT 10/28/2019 --> measured 4.6 cm.   Thyroid disease    Tricuspid valvular regurgitation    a.) TTE 03/26/2018 --> mild to moderate.    Current Outpatient Medications  Medication Sig Dispense Refill   allopurinol (ZYLOPRIM) 300 MG tablet Take 1 tablet (300 mg total) by mouth daily. 90 tablet 1   amLODipine (NORVASC) 10 MG tablet Take 1 tablet (10 mg total) by mouth daily. 90 tablet 0   aspirin EC 81 MG tablet Take 1 tablet (81 mg total) by mouth daily. 30 tablet 1   atorvastatin (LIPITOR) 40 MG tablet Take 1 tablet (40 mg total) by mouth daily. 90 tablet 0   Cholecalciferol (VITAMIN D3) 25 MCG (1000 UT) CAPS Take 1 capsule (1,000 Units total) by mouth daily. 90 capsule 1   cloNIDine (CATAPRES) 0.1 MG tablet Take 1 tablet (0.1 mg total) by mouth daily. 90 tablet 0   furosemide (LASIX) 40 MG tablet Take 1 tablet (40 mg total) by mouth daily. 30 tablet 5   gabapentin (NEURONTIN) 300 MG capsule TAKE 2 CAPSULES (600 MG TOTAL) BY MOUTH AT BEDTIME. 180 capsule 0   hydrALAZINE (APRESOLINE) 100 MG tablet TAKE 1 TABLET BY MOUTH 3 TIMES DAILY. 270 tablet 0   lisinopril (ZESTRIL) 40 MG tablet Take 1 tablet (40 mg total) by mouth daily. 90 tablet 0   metoprolol tartrate (LOPRESSOR) 25 MG tablet Take 1 tablet (25 mg total) by mouth 2 (two) times daily. 60 tablet 0   pantoprazole (PROTONIX) 40 MG tablet Take 1 tablet (40 mg total) by mouth daily. 90 tablet 0   potassium chloride SA (KLOR-CON M) 20 MEQ tablet Take 1 tablet (20 mEq total) by mouth 2 (two) times daily. 60 tablet 5   PROAIR HFA 108 (90 Base) MCG/ACT inhaler INHALE TWO (2) PUFFS BY MOUTH EVERY 6  HOURS AS NEEDED FOR WHEEZING OR FOR SHORTNESS OF BREATH 8.5 g 0   spironolactone (ALDACTONE) 25 MG tablet Take 1 tablet (25 mg total) by mouth daily. 90 tablet 3   tiotropium (SPIRIVA HANDIHALER) 18 MCG inhalation capsule Place 1 capsule (18 mcg total) into inhaler and inhale daily. 30 capsule 1   No current facility-administered medications for this visit.   Vitals:   09/24/22 1020  BP: 117/84  Pulse: 75  SpO2: 100%  Weight: 234 lb (106.1 kg)  Height: 5\' 10"  (1.778 m)    Wt Readings from Last 3 Encounters:  09/24/22 234 lb (106.1 kg)  07/27/22 229 lb (103.9 kg)  07/08/22 238 lb 6.4 oz (108.1 kg)   Lab Results  Component Value Date   CREATININE 0.71 07/27/2022   CREATININE 0.76 07/06/2022   CREATININE 0.84 01/12/2022   PHYSICAL EXAM:  General:  Well appearing. No resp difficulty HEENT: normal Neck: supple. JVP flat. No lymphadenopathy or thryomegaly appreciated. Cor: PMI normal. Regular rate & rhythm. No rubs, gallops or murmurs. Lungs: clear Abdomen: soft, nontender, nondistended. No hepatosplenomegaly. No bruits or masses.  Extremities: no cyanosis, clubbing, rash, trace pitting edema bilateral lower legs Neuro: alert & orientedx3, cranial nerves grossly intact. Moves all 4 extremities w/o difficulty. Affect pleasant.   ECG: not done   ASSESSMENT & PLAN:  1: NICM with preserved ejection fraction- - likely due to HTN/ OSA as cath in 2020 was without CAD - NYHA class II - euvolemic today - not weighing daily; reminded to call for an overnight weight gain of >2 pounds or a weekly weight gain of >5 pounds.  - weight up 5 pounds from last visit here 2 months ago - Echo 06/17/16: EF of 55-65%. - Echo 03/26/2018: EF of 60-65% along with mild/ moderate TR.   - Echo 09/21/22: EF 55-60% with moderate LVH, Grade II DD and mild dilatation of the aortic root, measuring 44 mm. There is mild dilatation of the ascending aorta, measuring 41 mm. There is mild dilatation of the aortic  arch, measuring 39 mm.  - Catheterization 03/27/2018:  normal coronary arteries and normal left ventricular function. - not adding salt to her food. Reminded to closely follow a 2000mg  sodium diet  - saw cardiology Mellissa Kohut) 02/22; needs f/u appt  - continue lisinopril 40mg  daily; consider changing to entresto - continue metoprolol tartrate 25mg  BID - continue spironolactone 25mg  daily - continue furosemide 40mg  daily/ potassium daily - consider SGLT2 - BMP today - BNP 09/01/21 was 135.3  2: HTN- - BP 117/84 - saw PCP Caren Griffins) 06/24 - continue amlodipine 10mg  daily - continue clonidine 0.1mg  daily; if BP continues to look good, try to wean off clonidine - continue hydralazine 100mg  TID - continue lisinopril 40mg  daily - BMP 07/27/22 and it showed sodium 142, potassium 3.5, creatinine 0.71 and GFR >60 - BMP today  3: Obstructive sleep apnea- - reports sleeping well - does not have oxygen and hasn't seen pulmonology  4: Depression/ anxiety- - continues to struggle with depression/ anxiety but overall says that she's feeling well  5: Thoracic aneurysm- - stable per CT's - has been followed by [facility] cardiology; have reached out to them to get f/u scheduled since patient is over due - Echo 09/21/22: EF 55-60% with moderate LVH, Grade II DD and mild dilatation of the aortic root, measuring 44 mm. There is mild dilatation of the ascending aorta, measuring 41 mm. There is mild dilatation of the aortic arch, measuring 39 mm.  - chest CTA 01/11/22: Ascending thoracic aorta is mildly dilated measuring 4.3 cm  Return in 6 months, sooner if needed.

## 2022-09-29 ENCOUNTER — Telehealth: Payer: Self-pay

## 2022-09-29 DIAGNOSIS — I5032 Chronic diastolic (congestive) heart failure: Secondary | ICD-10-CM

## 2022-09-29 NOTE — Telephone Encounter (Signed)
-----   Message from Nurse Sheryn Bison sent at 09/24/2022  2:30 PM EDT -----  ----- Message ----- From: Delma Freeze, FNP Sent: 09/24/2022  12:39 PM EDT To: [facility] Hrt Triage  Kidney function looks great. Potassium is low so for the next 2 days, take 3 potassium tablets daily. After that, resume taking 2 tablets daily. Decrease furosemide to 1/2 tablet daily. Recheck BMP in 2 weeks.

## 2022-10-02 ENCOUNTER — Other Ambulatory Visit: Payer: Self-pay | Admitting: Nurse Practitioner

## 2022-10-04 NOTE — Telephone Encounter (Signed)
Requested Prescriptions  Pending Prescriptions Disp Refills   cloNIDine (CATAPRES) 0.1 MG tablet [Pharmacy Med Name: CLONIDINE HCL 0.1 MG TABLET] 90 tablet 0    Sig: TAKE 1 TABLET BY MOUTH EVERY DAY     Cardiovascular:  Alpha-2 Agonists Passed - 10/02/2022  2:09 AM      Passed - Last BP in normal range    BP Readings from Last 1 Encounters:  09/24/22 117/84         Passed - Last Heart Rate in normal range    Pulse Readings from Last 1 Encounters:  09/24/22 75         Passed - Valid encounter within last 6 months    Recent Outpatient Visits           3 months ago Essential hypertension   [place] [facility] Larae Grooms, NP   10 months ago Chronic diastolic heart failure [facility])   [place] [facility] Larae Grooms, NP   11 months ago Annual physical exam   [place] [facility] Larae Grooms, NP   1 year ago Chronic obstructive pulmonary disease, unspecified COPD type (HCC)   [place] [facility] Larae Grooms, NP   1 year ago Chronic pain of right knee   [place] [facility] Larae Grooms, NP       Future Appointments             In 2 days Mecum, Oswaldo Conroy, PA-C [place] [facility]             atorvastatin (LIPITOR) 40 MG tablet [Pharmacy Med Name: ATORVASTATIN 40 MG TABLET] 90 tablet 0    Sig: TAKE 1 TABLET BY MOUTH EVERY DAY     Cardiovascular:  Antilipid - Statins Failed - 10/02/2022  2:09 AM      Failed - Lipid Panel in normal range within the last 12 months    Cholesterol, Total  Date Value Ref Range Status  07/06/2022 196 100 - 199 mg/dL Final   Cholesterol  Date Value Ref Range Status  09/12/2012 187 0 - 200 mg/dL Final   Ldl Cholesterol, Calc  Date Value Ref Range Status  09/12/2012 122 (H) 0 - 100 mg/dL Final   LDL Chol Calc (NIH)  Date Value Ref Range Status  07/06/2022 121 (H) 0 - 99 mg/dL Final   HDL Cholesterol  Date Value  Ref Range Status  09/12/2012 46 40 - 60 mg/dL Final   HDL  Date Value Ref Range Status  07/06/2022 53 >39 mg/dL Final   Triglycerides  Date Value Ref Range Status  07/06/2022 124 0 - 149 mg/dL Final  52/84/1324 96 0 - 200 mg/dL Final         Passed - Patient is not pregnant      Passed - Valid encounter within last 12 months    Recent Outpatient Visits           3 months ago Essential hypertension   [place] [facility] Larae Grooms, NP   10 months ago Chronic diastolic heart failure [facility])   [place] [facility] Larae Grooms, NP   11 months ago Annual physical exam   [place] [facility] Larae Grooms, NP   1 year ago Chronic obstructive pulmonary disease, unspecified COPD type [facility])   [place] [facility] Larae Grooms, NP   1 year ago Chronic pain of right knee  [place] [facility] Larae Grooms, NP       Future Appointments             In 2 days Mecum, Oswaldo Conroy, PA-C [place] [facility]             amLODipine (NORVASC) 10 MG tablet [Pharmacy Med Name: AMLODIPINE BESYLATE 10 MG TAB] 90 tablet 0    Sig: TAKE 1 TABLET BY MOUTH EVERY DAY     Cardiovascular: Calcium Channel Blockers 2 Passed - 10/02/2022  2:09 AM      Passed - Last BP in normal range    BP Readings from Last 1 Encounters:  09/24/22 117/84         Passed - Last Heart Rate in normal range    Pulse Readings from Last 1 Encounters:  09/24/22 75         Passed - Valid encounter within last 6 months    Recent Outpatient Visits           3 months ago Essential hypertension   [place] [facility] Larae Grooms, NP   10 months ago Chronic diastolic heart failure [facility])   [place] [facility] Larae Grooms, NP   11 months ago Annual physical exam   [place] [facility] Larae Grooms, NP   1 year ago Chronic  obstructive pulmonary disease, unspecified COPD type [facility])   [place] [facility] Larae Grooms, NP   1 year ago Chronic pain of right knee   [place] [facility] Larae Grooms, NP       Future Appointments             In 2 days Mecum, Oswaldo Conroy, PA-C [place] [facility]

## 2022-10-06 ENCOUNTER — Ambulatory Visit (INDEPENDENT_AMBULATORY_CARE_PROVIDER_SITE_OTHER): Payer: Medicaid Other | Admitting: Physician Assistant

## 2022-10-06 DIAGNOSIS — Z09 Encounter for follow-up examination after completed treatment for conditions other than malignant neoplasm: Secondary | ICD-10-CM

## 2022-10-12 ENCOUNTER — Ambulatory Visit: Payer: Medicaid Other | Admitting: Physician Assistant

## 2022-10-12 NOTE — Progress Notes (Signed)
Cancelled apt - patient not seen

## 2022-10-27 ENCOUNTER — Ambulatory Visit: Payer: Medicaid Other | Admitting: Nurse Practitioner

## 2022-11-09 ENCOUNTER — Ambulatory Visit: Payer: Self-pay

## 2022-11-09 NOTE — Telephone Encounter (Signed)
  Chief Complaint: SOB Symptoms: SOB d/t being out of inhaler, SOB worse at nights  Frequency: several weeks  Pertinent Negatives: NA Disposition: [] ED /[] Urgent Care (no appt availability in office) / [x] Appointment(In office/virtual)/ []  [facility] [facility]/ [] Home Care/ [] Refused Recommended Disposition /[] [place] [facility]/ []  Follow-up with PCP Additional Notes: pt states she has been out of inhaler, doesn't like to use Spriva because it causes dizziness. Pt states SOB been waking her up at nights where she has to step outside or breathe in a paper bag. Pt used to have oxygen but oxygen concentrator quit working and [facility] never brought her new one back. Scheduled pt for OV tomorrow at 0920 with Dr. Evelene Croon.   Reason for Disposition  [1] MODERATE longstanding difficulty breathing (e.g., speaks in phrases, SOB even at rest, pulse 100-120) AND [2] SAME as normal  Answer Assessment - Initial Assessment Questions 1. RESPIRATORY STATUS: "Describe your breathing?" (e.g., wheezing, shortness of breath, unable to speak, severe coughing)      SOB 2. ONSET: "When did this breathing problem begin?"      Several weeks 3. PATTERN "Does the difficult breathing come and go, or has it been constant since it started?"      Comes and goes worse at night  4. SEVERITY: "How bad is your breathing?" (e.g., mild, moderate, severe)    - MILD: No SOB at rest, mild SOB with walking, speaks normally in sentences, can lie down, no retractions, pulse < 100.    - MODERATE: SOB at rest, SOB with minimal exertion and prefers to sit, cannot lie down flat, speaks in phrases, mild retractions, audible wheezing, pulse 100-120.    - SEVERE: Very SOB at rest, speaks in single words, struggling to breathe, sitting hunched forward, retractions, pulse > 120      Mild to moderate   7. LUNG HISTORY: "Do you have any history of lung disease?"  (e.g., pulmonary embolus, asthma, emphysema)     COPD  8. CAUSE: "What do you  think is causing the breathing problem?"      Out of inhaler  9. OTHER SYMPTOMS: "Do you have any other symptoms? (e.g., dizziness, runny nose, cough, chest pain, fever)     Dizziness when using Spriva  Protocols used: Breathing Difficulty-A-AH

## 2022-11-10 ENCOUNTER — Ambulatory Visit (INDEPENDENT_AMBULATORY_CARE_PROVIDER_SITE_OTHER): Payer: Medicaid Other | Admitting: Pediatrics

## 2022-11-10 ENCOUNTER — Encounter: Payer: Self-pay | Admitting: Pediatrics

## 2022-11-10 VITALS — BP 132/86 | HR 81 | Temp 97.9°F | Wt 230.4 lb

## 2022-11-10 DIAGNOSIS — J449 Chronic obstructive pulmonary disease, unspecified: Secondary | ICD-10-CM

## 2022-11-10 DIAGNOSIS — F419 Anxiety disorder, unspecified: Secondary | ICD-10-CM | POA: Diagnosis not present

## 2022-11-10 DIAGNOSIS — Z1330 Encounter for screening examination for mental health and behavioral disorders, unspecified: Secondary | ICD-10-CM | POA: Diagnosis not present

## 2022-11-10 MED ORDER — TIOTROPIUM BROMIDE-OLODATEROL 2.5-2.5 MCG/ACT IN AERS
2.0000 | INHALATION_SPRAY | Freq: Every day | RESPIRATORY_TRACT | 3 refills | Status: AC
Start: 2022-11-10 — End: 2023-11-10

## 2022-11-10 MED ORDER — CITALOPRAM HYDROBROMIDE 10 MG PO TABS
10.0000 mg | ORAL_TABLET | Freq: Every day | ORAL | 3 refills | Status: DC
Start: 2022-11-10 — End: 2023-05-09

## 2022-11-10 MED ORDER — ALBUTEROL SULFATE HFA 108 (90 BASE) MCG/ACT IN AERS
2.0000 | INHALATION_SPRAY | Freq: Four times a day (QID) | RESPIRATORY_TRACT | 2 refills | Status: DC | PRN
Start: 2022-11-10 — End: 2022-11-30

## 2022-11-10 NOTE — Progress Notes (Signed)
Acute Visit  BP 132/86   Pulse 81   Temp 97.9 F (36.6 C) (Oral)   Wt 230 lb 6.4 oz (104.5 kg)   SpO2 97%   BMI 33.06 kg/m    Subjective:    Patient ID: [name], female    DOB: 03/29/1965, 57 y.o.   MRN: [id number]  HPI: [name] is a 57 y.o. female  Chief Complaint  Patient presents with   COPD    Pt states she is unable to breath    #SOB Worsening over the last 3 months She has been without her inhaler Previously had oxygen machine, 2L as needed She notes chronic cough, no increased sputum Feels worsened breathing when laying down, leans forward Weighs herself at home, no weight changes No chest pain,  palpations, orthopnea, dyspnea, PND, lower extremity edema. She uses a CPAP at night consistently Today she would like refills on her inhalers and to work on getting her oxygen again at home  Relevant past medical, surgical, family and social history reviewed and updated as indicated. Interim medical history since our last visit reviewed. Allergies and medications reviewed and updated.  ROS per HPI unless specifically indicated above     Objective:    BP 132/86   Pulse 81   Temp 97.9 F (36.6 C) (Oral)   Wt 230 lb 6.4 oz (104.5 kg)   SpO2 97%   BMI 33.06 kg/m   Wt Readings from Last 3 Encounters:  11/11/22 232 lb (105.2 kg)  11/10/22 230 lb 6.4 oz (104.5 kg)  09/24/22 234 lb (106.1 kg)     Physical Exam Constitutional:      Appearance: Normal appearance.  HENT:     Head: Normocephalic and atraumatic.  Eyes:     Pupils: Pupils are equal, round, and reactive to light.  Cardiovascular:     Rate and Rhythm: Normal rate and regular rhythm.     Pulses: Normal pulses.     Heart sounds: Normal heart sounds.  Pulmonary:     Effort: Pulmonary effort is normal. No respiratory distress.     Breath sounds: Normal breath sounds. No stridor. No wheezing or rhonchi.  Abdominal:     General: Abdomen is flat.     Palpations: Abdomen is soft.   Musculoskeletal:        General: Normal range of motion.     Cervical back: Normal range of motion.  Skin:    General: Skin is warm and dry.     Capillary Refill: Capillary refill takes less than 2 seconds.  Neurological:     General: No focal deficit present.     Mental Status: She is alert. Mental status is at baseline.  Psychiatric:        Mood and Affect: Mood normal.        Behavior: Behavior normal.        11/10/2022    9:55 AM 07/06/2022   11:20 AM 10/16/2021    8:34 AM 08/14/2021   10:29 AM 07/31/2021   10:56 AM  Depression screen PHQ 2/9  Decreased Interest 1 1 1 2 2   Down, Depressed, Hopeless 2 2 2 2 1   PHQ - 2 Score 3 3 3 4 3   Altered sleeping 2 0 2 2 2   Tired, decreased energy 2 2 2 2 2   Change in appetite 3 1 2 3 1   Feeling bad or failure about yourself  1 1 1  1   Trouble concentrating 1 2  1 2 1   Moving slowly or fidgety/restless 1 1 0 1 1  Suicidal thoughts 1 0 0 0 0  PHQ-9 Score 14 10 11 14 11   Difficult doing work/chores Not difficult at all Somewhat difficult Not difficult at all Very difficult Somewhat difficult      11/10/2022    9:56 AM 07/06/2022   11:22 AM 10/16/2021    8:35 AM 10/14/2021   10:58 AM  GAD 7 : Generalized Anxiety Score  Nervous, Anxious, on Edge 2 1 3  0  Control/stop worrying 2 1 2  0  Worry too much - different things 3 1 2  0  Trouble relaxing 2 1 2  0  Restless 1 1 2  0  Easily annoyed or irritable 1 1 2  0  Afraid - awful might happen 2 2 2  0  Total GAD 7 Score 13 8 15  0  Anxiety Difficulty Somewhat difficult Somewhat difficult Not difficult at all Not difficult at all      Assessment & Plan:  Assessment & Plan   Chronic obstructive pulmonary disease, unspecified COPD type (HCC) Assessment & Plan: Very well appearing on exam. Low suspcion for exacerbation. Suspect chronic breakthrough symptoms from being on inadequate COPD therapy. Will also have her follow up with her cardiologist. Pt needs LAMA/LABA therapy, will start below.  Recommend she gets repeat spirometry at follow up.   Orders: -     Tiotropium Bromide-Olodaterol; Inhale 2 puffs into the lungs daily.  Dispense: 4 g; Refill: 3 -     Albuterol Sulfate HFA; Inhale 2 puffs into the lungs every 6 (six) hours as needed for wheezing or shortness of breath.  Dispense: 8 g; Refill: 2  Anxiety Assessment & Plan: Requesting refills of below. Previously discontinued wants to restart. No safety concerns.  Orders: -     Citalopram Hydrobromide; Take 1 tablet (10 mg total) by mouth daily.  Dispense: 30 tablet; Refill: 3  Encounter for behavioral health screening  As part of their intake evaluation, the patient was screened for depression, anxiety.  PHQ9 SCORE 14, GAD7 SCORE 13. Screening results positive for tested conditions. See plan under problem/diagnosis above.  Follow up plan: Return in about 2 weeks (around 11/24/2022) for copd,w spirometry.  [place] Gamero Howell Pringle, MD

## 2022-11-11 ENCOUNTER — Other Ambulatory Visit: Payer: Self-pay

## 2022-11-11 ENCOUNTER — Emergency Department
Admission: EM | Admit: 2022-11-11 | Discharge: 2022-11-11 | Disposition: A | Discharge status: Home / Self Care | Payer: Medicaid Other | Attending: Emergency Medicine | Admitting: Emergency Medicine

## 2022-11-11 ENCOUNTER — Encounter: Payer: Self-pay | Admitting: Emergency Medicine

## 2022-11-11 ENCOUNTER — Emergency Department: Payer: Medicaid Other

## 2022-11-11 DIAGNOSIS — Z7982 Long term (current) use of aspirin: Secondary | ICD-10-CM | POA: Insufficient documentation

## 2022-11-11 DIAGNOSIS — Z79899 Other long term (current) drug therapy: Secondary | ICD-10-CM | POA: Diagnosis not present

## 2022-11-11 DIAGNOSIS — R112 Nausea with vomiting, unspecified: Secondary | ICD-10-CM | POA: Diagnosis not present

## 2022-11-11 DIAGNOSIS — E876 Hypokalemia: Secondary | ICD-10-CM | POA: Diagnosis not present

## 2022-11-11 DIAGNOSIS — Z86018 Personal history of other benign neoplasm: Secondary | ICD-10-CM | POA: Insufficient documentation

## 2022-11-11 DIAGNOSIS — I110 Hypertensive heart disease with heart failure: Secondary | ICD-10-CM | POA: Insufficient documentation

## 2022-11-11 DIAGNOSIS — R197 Diarrhea, unspecified: Secondary | ICD-10-CM | POA: Diagnosis not present

## 2022-11-11 DIAGNOSIS — J449 Chronic obstructive pulmonary disease, unspecified: Secondary | ICD-10-CM | POA: Diagnosis not present

## 2022-11-11 DIAGNOSIS — I10 Essential (primary) hypertension: Secondary | ICD-10-CM

## 2022-11-11 DIAGNOSIS — I509 Heart failure, unspecified: Secondary | ICD-10-CM | POA: Insufficient documentation

## 2022-11-11 DIAGNOSIS — Z20822 Contact with and (suspected) exposure to covid-19: Secondary | ICD-10-CM | POA: Diagnosis not present

## 2022-11-11 DIAGNOSIS — R519 Headache, unspecified: Secondary | ICD-10-CM | POA: Diagnosis present

## 2022-11-11 DIAGNOSIS — R6889 Other general symptoms and signs: Secondary | ICD-10-CM

## 2022-11-11 LAB — RESP PANEL BY RT-PCR (RSV, FLU A&B, COVID)  RVPGX2
Influenza A by PCR: NEGATIVE
Influenza B by PCR: NEGATIVE
Resp Syncytial Virus by PCR: NEGATIVE
SARS Coronavirus 2 by RT PCR: NEGATIVE

## 2022-11-11 LAB — COMPREHENSIVE METABOLIC PANEL
ALT: 8 U/L (ref 0–44)
AST: 14 U/L — ABNORMAL LOW (ref 15–41)
Albumin: 3.6 g/dL (ref 3.5–5.0)
Alkaline Phosphatase: 64 U/L (ref 38–126)
Anion gap: 11 (ref 5–15)
BUN: 8 mg/dL (ref 6–20)
CO2: 27 mmol/L (ref 22–32)
Calcium: 8.8 mg/dL — ABNORMAL LOW (ref 8.9–10.3)
Chloride: 103 mmol/L (ref 98–111)
Creatinine, Ser: 0.8 mg/dL (ref 0.44–1.00)
GFR, Estimated: >60 mL/min (ref >60)
Glucose, Bld: 120 mg/dL — ABNORMAL HIGH (ref 70–99)
Potassium: 3.2 mmol/L — ABNORMAL LOW (ref 3.5–5.1)
Sodium: 141 mmol/L (ref 135–145)
Total Bilirubin: 0.9 mg/dL (ref 0.3–1.2)
Total Protein: 7.1 g/dL (ref 6.5–8.1)

## 2022-11-11 LAB — CBC WITH DIFFERENTIAL/PLATELET
Abs Immature Granulocytes: 0.01 10*3/uL (ref 0.00–0.07)
Basophils Absolute: 0 10*3/uL (ref 0.0–0.1)
Basophils Relative: 1 %
Eosinophils Absolute: 0.1 10*3/uL (ref 0.0–0.5)
Eosinophils Relative: 2 %
HCT: 44.5 % (ref 36.0–46.0)
Hemoglobin: 14.1 g/dL (ref 12.0–15.0)
Immature Granulocytes: 0 %
Lymphocytes Relative: 30 %
Lymphs Abs: 1.7 10*3/uL (ref 0.7–4.0)
MCH: 30.3 pg (ref 26.0–34.0)
MCHC: 31.7 g/dL (ref 30.0–36.0)
MCV: 95.5 fL (ref 80.0–100.0)
Monocytes Absolute: 0.6 10*3/uL (ref 0.1–1.0)
Monocytes Relative: 10 %
Neutro Abs: 3.4 10*3/uL (ref 1.7–7.7)
Neutrophils Relative %: 57 %
Platelets: 271 10*3/uL (ref 150–400)
RBC: 4.66 MIL/uL (ref 3.87–5.11)
RDW: 15.5 % (ref 11.5–15.5)
WBC: 5.8 10*3/uL (ref 4.0–10.5)
nRBC: 0 % (ref 0.0–0.2)

## 2022-11-11 LAB — MAGNESIUM: Magnesium: 1.8 mg/dL (ref 1.7–2.4)

## 2022-11-11 LAB — URINALYSIS, ROUTINE W REFLEX MICROSCOPIC
Bilirubin Urine: NEGATIVE
Glucose, UA: NEGATIVE mg/dL
Hgb urine dipstick: NEGATIVE
Ketones, ur: NEGATIVE mg/dL
Leukocytes,Ua: NEGATIVE
Nitrite: NEGATIVE
Protein, ur: NEGATIVE mg/dL
Specific Gravity, Urine: 1.008 (ref 1.005–1.030)
pH: 7 (ref 5.0–8.0)

## 2022-11-11 LAB — TROPONIN I (HIGH SENSITIVITY)
Troponin I (High Sensitivity): 8 ng/L (ref <18)
Troponin I (High Sensitivity): 9 ng/L (ref <18)

## 2022-11-11 LAB — LIPASE, BLOOD: Lipase: 28 U/L (ref 11–51)

## 2022-11-11 MED ORDER — KETOROLAC TROMETHAMINE 30 MG/ML IJ SOLN
30.0000 mg | Freq: Once | INTRAMUSCULAR | Status: AC
  Administered 2022-11-11: 30 mg via INTRAVENOUS
  Filled 2022-11-11: qty 1

## 2022-11-11 MED ORDER — IPRATROPIUM-ALBUTEROL 0.5-2.5 (3) MG/3ML IN SOLN
3.0000 mL | Freq: Once | RESPIRATORY_TRACT | Status: AC
  Administered 2022-11-11: 3 mL via RESPIRATORY_TRACT
  Filled 2022-11-11: qty 3

## 2022-11-11 MED ORDER — HYDRALAZINE HCL 50 MG PO TABS
100.0000 mg | ORAL_TABLET | Freq: Once | ORAL | Status: AC
  Administered 2022-11-11: 100 mg via ORAL
  Filled 2022-11-11: qty 2

## 2022-11-11 MED ORDER — HYDRALAZINE HCL 20 MG/ML IJ SOLN
5.0000 mg | Freq: Once | INTRAMUSCULAR | Status: DC
  Filled 2022-11-11: qty 1

## 2022-11-11 MED ORDER — ONDANSETRON HCL 4 MG/2ML IJ SOLN
4.0000 mg | Freq: Once | INTRAMUSCULAR | Status: AC
  Administered 2022-11-11: 4 mg via INTRAVENOUS
  Filled 2022-11-11: qty 2

## 2022-11-11 MED ORDER — POTASSIUM CHLORIDE CRYS ER 20 MEQ PO TBCR
40.0000 meq | EXTENDED_RELEASE_TABLET | Freq: Once | ORAL | Status: AC
  Administered 2022-11-11: 40 meq via ORAL
  Filled 2022-11-11: qty 2

## 2022-11-11 MED ORDER — ONDANSETRON 4 MG PO TBDP: 4.0000 mg | ORAL_TABLET | Freq: Four times a day (QID) | ORAL | 0 refills | Status: AC | PRN

## 2022-11-11 NOTE — ED Notes (Signed)
Pt ambulated to toilet and back with standby assist. Pt gait steady and independent with toileting.

## 2022-11-11 NOTE — ED Notes (Signed)
ED Provider at bedside. 

## 2022-11-11 NOTE — ED Notes (Addendum)
Pt states she uses 2 L/min of oxygen at home continually.

## 2022-11-11 NOTE — ED Provider Notes (Signed)
[facility] Provider Note    Event Date/Time   First MD Initiated Contact with Patient 11/11/22 [phone]     (approximate)   History   No chief complaint on file.   HPI  [name] is a 57 y.o. female with history of atrial fibrillation, COPD on 2 L nasal cannula, CHF, hypertension, substance abuse, previous history of PE who presents to the emergency department with complaints of flulike symptoms for 1 to 2 days.  No known fevers but has had cough, congestion, shortness of breath, intermittent chest pain, abdominal discomfort, vomiting, diarrhea.  No dysuria, hematuria, vaginal bleeding or discharge.  Denies previous abdominal surgery.  No chest pain currently.  Has a slight headache.  No vision changes, numbness, tingling or focal weakness.   Patient is hypertensive in the ED.  Reports compliance with her blood pressure medications.  Per her chart it appears she is on amlodipine, clonidine, furosemide, hydralazine, lisinopril, metoprolol, spironolactone.  History provided by patient.    Past Medical History:  Diagnosis Date   A-fib [facility]) 2010   Adrenal adenoma, left 10/06/2004   a.) CT 10/06/2004 --> measured 3.1 x 1.6 cm   Alcohol abuse    Allergy    Anginal pain (HCC)    Anxiety    Cardiac murmur    CHF (congestive heart failure) (HCC)    COPD (chronic obstructive pulmonary disease) (HCC)    Diverticulosis    Fatty liver    GERD (gastroesophageal reflux disease)    Gout    History of cocaine abuse (HCC)    a.) reports that use discontinued following CVA in 2010.   History of kidney stones    Hypertension    Mild cognitive impairment    Non-healing non-surgical wound    right leg   Obesity    On supplemental oxygen therapy    a.) 2 L/[place]   OSA on CPAP    Pulmonary embolus (HCC) 02/19/2012   a.) CTA --> RIGHT upper lobe.   Stroke [facility]) 2010   Thoracic ascending aortic aneurysm (HCC)    a.) CT 10/28/2019 --> measured 4.6 cm.   Thyroid  disease    Tricuspid valvular regurgitation    a.) TTE 03/26/2018 --> mild to moderate.    Past Surgical History:  Procedure Laterality Date   CYSTOSCOPY WITH STENT PLACEMENT Left 10/02/2020   Procedure: CYSTOSCOPY WITH STENT PLACEMENT;  Surgeon: Sondra Come, MD;  Location: [facility] ORS;  Service: Urology;  Laterality: Left;   CYSTOSCOPY/URETEROSCOPY/HOLMIUM LASER/STENT PLACEMENT Left 10/24/2020   Procedure: CYSTOSCOPY/URETEROSCOPY/HOLMIUM LASER/STENT EXCHANGE;  Surgeon: Sondra Come, MD;  Location: [facility] ORS;  Service: Urology;  Laterality: Left;   LEFT HEART CATH AND CORONARY ANGIOGRAPHY N/A 03/27/2018   Procedure: LEFT HEART CATH AND CORONARY ANGIOGRAPHY;  Surgeon: Marcina Millard, MD;  Location: [facility] INVASIVE CV LAB;  Service: Cardiovascular;  Laterality: N/A;   ORIF ANKLE FRACTURE BIMALLEOLAR Left 09/20/2001   ORIF WRIST FRACTURE Bilateral 2010    MEDICATIONS:  Prior to Admission medications   Medication Sig Start Date End Date Taking? Authorizing Provider  albuterol (VENTOLIN HFA) 108 (90 Base) MCG/ACT inhaler Inhale 2 puffs into the lungs every 6 (six) hours as needed for wheezing or shortness of breath. 11/10/22   Jackolyn Confer, MD  allopurinol (ZYLOPRIM) 300 MG tablet Take 1 tablet (300 mg total) by mouth daily. 10/16/21   Larae Grooms, NP  amLODipine (NORVASC) 10 MG tablet TAKE 1 TABLET BY MOUTH EVERY DAY 10/04/22  Larae Grooms, NP  aspirin EC 81 MG tablet Take 1 tablet (81 mg total) by mouth daily. 10/08/20   Larae Grooms, NP  atorvastatin (LIPITOR) 40 MG tablet TAKE 1 TABLET BY MOUTH EVERY DAY 10/04/22   Larae Grooms, NP  Cholecalciferol (VITAMIN D3) 25 MCG (1000 UT) CAPS Take 1 capsule (1,000 Units total) by mouth daily. 10/16/21   Larae Grooms, NP  citalopram (CELEXA) 10 MG tablet Take 1 tablet (10 mg total) by mouth daily. 11/10/22   Jackolyn Confer, MD  cloNIDine (CATAPRES) 0.1 MG tablet TAKE 1 TABLET BY MOUTH EVERY DAY 10/04/22   Larae Grooms, NP  furosemide (LASIX) 40 MG tablet Take 1 tablet (40 mg total) by mouth daily. 08/06/22 11/04/22  Aura Dials T, NP  gabapentin (NEURONTIN) 300 MG capsule TAKE 2 CAPSULES (600 MG TOTAL) BY MOUTH AT BEDTIME. 07/07/22   Larae Grooms, NP  hydrALAZINE (APRESOLINE) 100 MG tablet TAKE 1 TABLET BY MOUTH 3 TIMES DAILY. 01/29/22   Larae Grooms, NP  lisinopril (ZESTRIL) 40 MG tablet Take 1 tablet (40 mg total) by mouth daily. 07/06/22   Larae Grooms, NP  metoprolol tartrate (LOPRESSOR) 25 MG tablet Take 1 tablet (25 mg total) by mouth 2 (two) times daily. 01/13/22   Arnetha Courser, MD  pantoprazole (PROTONIX) 40 MG tablet Take 1 tablet (40 mg total) by mouth daily. 07/06/22   Larae Grooms, NP  potassium chloride SA (KLOR-CON M) 20 MEQ tablet Take 1 tablet (20 mEq total) by mouth 2 (two) times daily. 08/03/22   Delma Freeze, FNP  spironolactone (ALDACTONE) 25 MG tablet Take 1 tablet (25 mg total) by mouth daily. 07/08/22 10/06/22  Delma Freeze, FNP  Tiotropium Bromide-Olodaterol 2.5-2.5 MCG/ACT AERS Inhale 2 puffs into the lungs daily. 11/10/22 11/10/23  Jackolyn Confer, MD    Physical Exam   Triage Vital Signs: ED Triage Vitals  Encounter Vitals Group     BP 11/11/22 0200 (!) 184/151     Systolic BP Percentile --      Diastolic BP Percentile --      Pulse Rate 11/11/22 0200 85     Resp 11/11/22 0204 20     Temp 11/11/22 0204 97.7 F (36.5 C)     Temp Source 11/11/22 0204 Oral     SpO2 11/11/22 0200 93 %     Weight 11/11/22 0156 232 lb (105.2 kg)     Height 11/11/22 0156 5\' 10"  (1.778 m)     Head Circumference --      Peak Flow --      Pain Score 11/11/22 0155 8     Pain Loc --      Pain Education --      Exclude from Growth Chart --     Most recent vital signs: Vitals:   11/11/22 0500 11/11/22 0530  BP: (!) 155/120 (!) 169/115  Pulse: 85 79  Resp: (!) 33 19  Temp:    SpO2: 93% 91%    CONSTITUTIONAL: Alert, responds appropriately to questions.   Chronically ill-appearing, in no distress HEAD: Normocephalic, atraumatic EYES: Conjunctivae clear, pupils appear equal, sclera nonicteric ENT: normal nose; moist mucous membranes NECK: Supple, normal ROM, no meningismus CARD: RRR; S1 and S2 appreciated RESP: Normal chest excursion without splinting or tachypnea; breath sounds clear and equal bilaterally; no wheezes, no rhonchi, no rales, no hypoxia or respiratory distress, speaking full sentences ABD/GI: Non-distended; soft, non-tender, no rebound, no guarding, no peritoneal signs BACK: The back appears normal EXT:  Normal ROM in all joints; no deformity noted, no edema, no calf tenderness or calf swelling SKIN: Normal color for age and race; warm; no rash on exposed skin NEURO: Moves all extremities equally, normal speech PSYCH: The patient's mood and manner are appropriate.   ED Results / Procedures / Treatments   LABS: (all labs ordered are listed, but only abnormal results are displayed) Labs Reviewed  COMPREHENSIVE METABOLIC PANEL - Abnormal; Notable for the following components:      Result Value   Potassium 3.2 (*)    Glucose, Bld 120 (*)    Calcium 8.8 (*)    AST 14 (*)    All other components within normal limits  URINALYSIS, ROUTINE W REFLEX MICROSCOPIC - Abnormal; Notable for the following components:   Color, Urine YELLOW (*)    APPearance CLEAR (*)    All other components within normal limits  RESP PANEL BY RT-PCR (RSV, FLU A&B, COVID)  RVPGX2  CBC WITH DIFFERENTIAL/PLATELET  LIPASE, BLOOD  MAGNESIUM  TROPONIN I (HIGH SENSITIVITY)  TROPONIN I (HIGH SENSITIVITY)     EKG:  EKG Interpretation Date/Time:  Thursday November 11 2022 02:40:24 EDT Ventricular Rate:  78 PR Interval:  177 QRS Duration:  98 QT Interval:  441 QTC Calculation: 503 R Axis:   -34  Text Interpretation: Sinus rhythm Atrial premature complexes Abnormal R-wave progression, early transition Left ventricular hypertrophy Borderline T  abnormalities, diffuse leads Borderline prolonged QT interval Confirmed by Rochele Raring [id number]) on 11/11/2022 2:42:51 AM         RADIOLOGY: My personal review and interpretation of imaging: Chest x-ray clear.  I have personally reviewed all radiology reports.   DG Chest Portable 1 View  Result Date: 11/11/2022 CLINICAL DATA:  Cough and shortness of breath EXAM: PORTABLE CHEST 1 VIEW COMPARISON:  01-18-22 FINDINGS: Pronounced cardiomegaly with aortic tortuosity, unchanged. Posttraumatic deformity of the left clavicle and ribs. Large lung volumes with diaphragm flattening. There is no edema, consolidation, effusion, or pneumothorax. IMPRESSION: 1. No evidence of acute disease. 2. Marked cardiomegaly and aortic tortuosity. Electronically Signed   By: Tiburcio Pea M.D.   On: 11/11/2022 05:43     PROCEDURES:  Critical Care performed: No   CRITICAL CARE Performed by: Baxter Hire Panayiota Larkin   Total critical care time: 0 minutes  Critical care time was exclusive of separately billable procedures and treating other patients.  Critical care was necessary to treat or prevent imminent or life-threatening deterioration.  Critical care was time spent personally by me on the following activities: development of treatment plan with patient and/or surrogate as well as nursing, discussions with consultants, evaluation of patient's response to treatment, examination of patient, obtaining history from patient or surrogate, ordering and performing treatments and interventions, ordering and review of laboratory studies, ordering and review of radiographic studies, pulse oximetry and re-evaluation of patient's condition.   Marland Kitchen1-3 Lead EKG Interpretation  Performed by: Deeann Servidio, Layla Maw, DO Authorized by: Ambriel Gorelick, Layla Maw, DO     Interpretation: normal     ECG rate:  78   ECG rate assessment: normal     Rhythm: sinus rhythm     Ectopy: none     Conduction: normal       IMPRESSION / MDM / ASSESSMENT  AND PLAN / ED COURSE  I reviewed the triage vital signs and the nursing notes.    Patient here with flulike symptoms.  The patient is on the cardiac monitor to evaluate for evidence of arrhythmia and/or significant heart  rate changes.   DIFFERENTIAL DIAGNOSIS (includes but not limited to):   Viral illness, pneumonia, hypertensive urgency, hypertensive emergency, ACS, CHF, PE, doubt appendicitis, colitis, diverticulitis, bowel obstruction given benign exam   Patient's presentation is most consistent with acute presentation with potential threat to life or bodily function.   PLAN: Will obtain COVID and flu swab.  Will obtain labs with troponin x 2 given intermittent chest pain and shortness of breath with multiple risk factors.  No chest pain currently.  She states she does have some shortness of breath and feels like her breathing treatment may help her although lungs are currently clear to auscultation.  PE on the differential but I feel this is less likely.  Abdominal exam benign.  No indication for emergent abdominal imaging currently.  Will give Zofran, Toradol for symptomatic relief.  Will also give IV hydralazine.  I wonder if her blood pressure is elevated because she has been vomiting and not keeping down her blood pressure medication.  She does not appear to be in significant distress or discomfort at this time.   MEDICATIONS GIVEN IN ED: Medications  ondansetron (ZOFRAN) injection 4 mg (4 mg Intravenous Given 11/11/22 0249)  ketorolac (TORADOL) 30 MG/ML injection 30 mg (30 mg Intravenous Given 11/11/22 0249)  ipratropium-albuterol (DUONEB) 0.5-2.5 (3) MG/3ML nebulizer solution 3 mL (3 mLs Nebulization Given 11/11/22 0249)  potassium chloride SA (KLOR-CON M) CR tablet 40 mEq (40 mEq Oral Given 11/11/22 0428)  hydrALAZINE (APRESOLINE) tablet 100 mg (100 mg Oral Given 11/11/22 0549)     ED COURSE: Patient's labs show no leukocytosis, normal hemoglobin.  Troponin x 2 negative.   Potassium slightly low at 3.2.  Given replacement.  Normal LFTs and lipase.  Urine shows no sign of infection.  COVID, flu and RSV negative.  Chest x-ray reviewed and interpreted by myself and the radiologist and shows no pneumonia.  She reports feeling better and is tolerating p.o. today.  She is still hypertensive but advised her to take her medications when she gets home today.  She is comfortable with this plan.  Will have her follow-up closely with her PCP for her blood pressure but again I suspect it is due to not being able to keep things down with the nausea and vomiting.  Will discharge with Zofran.   At this time, I do not feel there is any life-threatening condition present. I reviewed all nursing notes, vitals, pertinent previous records.  All lab and urine results, EKGs, imaging ordered have been independently reviewed and interpreted by myself.  I reviewed all available radiology reports from any imaging ordered this visit.  Based on my assessment, I feel the patient is safe to be discharged home without further emergent workup and can continue workup as an outpatient as needed. Discussed all findings, treatment plan as well as usual and customary return precautions.  They verbalize understanding and are comfortable with this plan.  Outpatient follow-up has been provided as needed.  All questions have been answered.    CONSULTS:  none   OUTSIDE RECORDS REVIEWED: Reviewed cardiology note on 02/27/2020.       FINAL CLINICAL IMPRESSION(S) / ED DIAGNOSES   Final diagnoses:  Flu-like symptoms  Hypertension, unspecified type  Nausea, vomiting and diarrhea     Rx / DC Orders   ED Discharge Orders          Ordered    ondansetron (ZOFRAN-ODT) 4 MG disintegrating tablet  Every 6 hours PRN  11/11/22 0604             Note:  This document was prepared using Dragon voice recognition software and may include unintentional dictation errors.   Srihitha Tagliaferri, Layla Maw, DO 11/11/22  [phone]

## 2022-11-11 NOTE — Discharge Instructions (Addendum)
You may alternate Tylenol 1000 mg every 6 hours as needed for pain, fever and Ibuprofen 800 mg every 6-8 hours as needed for pain, fever.  Please take Ibuprofen with food.  Do not take more than 4000 mg of Tylenol (acetaminophen) in a 24 hour period.  You may take over-the-counter Imodium as needed for diarrhea.  I recommend a bland diet for the next several days.  Please take all of your blood pressure medications when you get home as scheduled except for your hydralazine which she received in the ED.

## 2022-11-11 NOTE — ED Triage Notes (Signed)
Patient ambulatory to triage with steady gait, without difficulty or distress noted; pt st x 2 days having prod cough yellow sputum, congestion with N/V/D, generalized abd pain

## 2022-11-13 ENCOUNTER — Encounter: Payer: Self-pay | Admitting: Pediatrics

## 2022-11-13 NOTE — Assessment & Plan Note (Addendum)
Very well appearing on exam. Low suspcion for exacerbation. Suspect chronic breakthrough symptoms from being on inadequate COPD therapy. Will also have her follow up with her cardiologist. Pt needs LAMA/LABA therapy, will start below. Recommend she gets repeat spirometry at follow up.

## 2022-11-13 NOTE — Assessment & Plan Note (Signed)
Requesting refills of below. Previously discontinued wants to restart. No safety concerns.

## 2022-11-29 NOTE — Progress Notes (Unsigned)
Acute Visit  There were no vitals taken for this visit.   Subjective:    Patient ID: [name], female    DOB: 1965-08-22, 57 y.o.   MRN: [id number]  HPI: [name] is a 57 y.o. female  No chief complaint on file.  #SOB Worsening over the last 3 months She has been without her inhaler Previously had oxygen machine, 2L as needed She notes chronic cough, no increased sputum Feels worsened breathing when laying down, leans forward Weighs herself at home, no weight changes No chest pain,  palpations, orthopnea, dyspnea, PND, lower extremity edema. She uses a CPAP at night consistently Today she would like refills on her inhalers and to work on getting her oxygen again at home  Relevant past medical, surgical, family and social history reviewed and updated as indicated. Interim medical history since our last visit reviewed. Allergies and medications reviewed and updated.  ROS per HPI unless specifically indicated above     Objective:    There were no vitals taken for this visit.  Wt Readings from Last 3 Encounters:  11/11/22 232 lb (105.2 kg)  11/10/22 230 lb 6.4 oz (104.5 kg)  09/24/22 234 lb (106.1 kg)     Physical Exam Constitutional:      Appearance: Normal appearance.  HENT:     Head: Normocephalic and atraumatic.  Eyes:     Pupils: Pupils are equal, round, and reactive to light.  Cardiovascular:     Rate and Rhythm: Normal rate and regular rhythm.     Pulses: Normal pulses.     Heart sounds: Normal heart sounds.  Pulmonary:     Effort: Pulmonary effort is normal. No respiratory distress.     Breath sounds: Normal breath sounds. No stridor. No wheezing or rhonchi.  Abdominal:     General: Abdomen is flat.     Palpations: Abdomen is soft.  Musculoskeletal:        General: Normal range of motion.     Cervical back: Normal range of motion.  Skin:    General: Skin is warm and dry.     Capillary Refill: Capillary refill takes less than 2 seconds.   Neurological:     General: No focal deficit present.     Mental Status: She is alert. Mental status is at baseline.  Psychiatric:        Mood and Affect: Mood normal.        Behavior: Behavior normal.       11/10/2022    9:55 AM 07/06/2022   11:20 AM 10/16/2021    8:34 AM 08/14/2021   10:29 AM 07/31/2021   10:56 AM  Depression screen PHQ 2/9  Decreased Interest 1 1 1 2 2   Down, Depressed, Hopeless 2 2 2 2 1   PHQ - 2 Score 3 3 3 4 3   Altered sleeping 2 0 2 2 2   Tired, decreased energy 2 2 2 2 2   Change in appetite 3 1 2 3 1   Feeling bad or failure about yourself  1 1 1  1   Trouble concentrating 1 2 1 2 1   Moving slowly or fidgety/restless 1 1 0 1 1  Suicidal thoughts 1 0 0 0 0  PHQ-9 Score 14 10 11 14 11   Difficult doing work/chores Not difficult at all Somewhat difficult Not difficult at all Very difficult Somewhat difficult      11/10/2022    9:56 AM 07/06/2022   11:22 AM 10/16/2021  8:35 AM 10/14/2021   10:58 AM  GAD 7 : Generalized Anxiety Score  Nervous, Anxious, on Edge 2 1 3  0  Control/stop worrying 2 1 2  0  Worry too much - different things 3 1 2  0  Trouble relaxing 2 1 2  0  Restless 1 1 2  0  Easily annoyed or irritable 1 1 2  0  Afraid - awful might happen 2 2 2  0  Total GAD 7 Score 13 8 15  0  Anxiety Difficulty Somewhat difficult Somewhat difficult Not difficult at all Not difficult at all      Assessment & Plan:  Assessment & Plan   There are no diagnoses linked to this encounter. As part of their intake evaluation, the patient was screened for depression, anxiety.  PHQ9 SCORE 14, GAD7 SCORE 13. Screening results positive for tested conditions. See plan under problem/diagnosis above.  Follow up plan: No follow-ups on file.  Larae Grooms, NP

## 2022-11-30 ENCOUNTER — Ambulatory Visit (INDEPENDENT_AMBULATORY_CARE_PROVIDER_SITE_OTHER): Payer: Medicaid Other | Admitting: Nurse Practitioner

## 2022-11-30 ENCOUNTER — Encounter: Payer: Self-pay | Admitting: Nurse Practitioner

## 2022-11-30 VITALS — BP 135/94 | HR 91 | Temp 97.5°F | Ht 70.0 in | Wt 225.4 lb

## 2022-11-30 DIAGNOSIS — M1A9XX0 Chronic gout, unspecified, without tophus (tophi): Secondary | ICD-10-CM | POA: Diagnosis not present

## 2022-11-30 DIAGNOSIS — I10 Essential (primary) hypertension: Secondary | ICD-10-CM

## 2022-11-30 DIAGNOSIS — J449 Chronic obstructive pulmonary disease, unspecified: Secondary | ICD-10-CM

## 2022-11-30 MED ORDER — COLCHICINE 0.6 MG PO TABS: 0.6000 mg | ORAL_TABLET | Freq: Every day | ORAL | 2 refills | Status: DC

## 2022-11-30 MED ORDER — ALBUTEROL SULFATE HFA 108 (90 BASE) MCG/ACT IN AERS: 2.0000 | INHALATION_SPRAY | Freq: Four times a day (QID) | RESPIRATORY_TRACT | 2 refills | Status: DC | PRN

## 2022-11-30 NOTE — Progress Notes (Signed)
BP (!) 135/94   Pulse 91   Temp (!) 97.5 F (36.4 C) (Oral)   Ht 5\' 10"  (1.778 m)   Wt 225 lb 6.4 oz (102.2 kg)   SpO2 97%   BMI 32.34 kg/m    Subjective:    Patient ID: [name], female    DOB: 1965-04-08, 57 y.o.   MRN: [id number]  HPI: [name] is a 57 y.o. female  Chief Complaint  Patient presents with   COPD   #SOB Patient states she got the Stioloto inhaler and feels like her breathing is improved.  She feels like she does still feel like she needs oxygen at home PRN.  She was previously on 2L but states someone picked up the machine and never brought her one back.    Patient states she has been experiencing gout in her left foot.  Started about 2 weeks.  States it is her whole foot.  Started in her big toe then moved to the rest of the foot.   Relevant past medical, surgical, family and social history reviewed and updated as indicated. Interim medical history since our last visit reviewed. Allergies and medications reviewed and updated.  Review of Systems  Respiratory:  Positive for shortness of breath.   Musculoskeletal:        LLE gout    Per HPI unless specifically indicated above     Objective:    BP (!) 135/94   Pulse 91   Temp (!) 97.5 F (36.4 C) (Oral)   Ht 5\' 10"  (1.778 m)   Wt 225 lb 6.4 oz (102.2 kg)   SpO2 97%   BMI 32.34 kg/m   Wt Readings from Last 3 Encounters:  11/30/22 225 lb 6.4 oz (102.2 kg)  11/11/22 232 lb (105.2 kg)  11/10/22 230 lb 6.4 oz (104.5 kg)    Physical Exam Vitals and nursing note reviewed.  Constitutional:      General: She is not in acute distress.    Appearance: Normal appearance. She is normal weight. She is not ill-appearing, toxic-appearing or diaphoretic.  HENT:     Head: Normocephalic.     Right Ear: External ear normal.     Left Ear: External ear normal.     Nose: Nose normal.     Mouth/Throat:     Mouth: Mucous membranes are moist.     Pharynx: Oropharynx is clear.  Eyes:     General:         Right eye: No discharge.        Left eye: No discharge.     Extraocular Movements: Extraocular movements intact.     Conjunctiva/sclera: Conjunctivae normal.     Pupils: Pupils are equal, round, and reactive to light.  Cardiovascular:     Rate and Rhythm: Normal rate and regular rhythm.     Heart sounds: No murmur heard. Pulmonary:     Effort: Pulmonary effort is normal. No respiratory distress.     Breath sounds: Normal breath sounds. No wheezing or rales.  Musculoskeletal:     Cervical back: Normal range of motion and neck supple.     Left foot: Tenderness present.     Comments: Warmth of Left great toe  Skin:    General: Skin is warm and dry.     Capillary Refill: Capillary refill takes less than 2 seconds.  Neurological:     General: No focal deficit present.     Mental Status: She is alert and  oriented to person, place, and time. Mental status is at baseline.  Psychiatric:        Mood and Affect: Mood normal.        Behavior: Behavior normal.        Thought Content: Thought content normal.        Judgment: Judgment normal.     Results for orders placed or performed during the hospital encounter of 11/11/22  Resp panel by RT-PCR (RSV, Flu A&B, Covid) Anterior Nasal Swab   Specimen: Anterior Nasal Swab  Result Value Ref Range   SARS Coronavirus 2 by RT PCR NEGATIVE NEGATIVE   Influenza A by PCR NEGATIVE NEGATIVE   Influenza B by PCR NEGATIVE NEGATIVE   Resp Syncytial Virus by PCR NEGATIVE NEGATIVE  CBC with Differential/Platelet  Result Value Ref Range   WBC 5.8 4.0 - 10.5 K/uL   RBC 4.66 3.87 - 5.11 MIL/uL   Hemoglobin 14.1 12.0 - 15.0 g/dL   HCT 95.6 38.7 - 56.4 %   MCV 95.5 80.0 - 100.0 fL   MCH 30.3 26.0 - 34.0 pg   MCHC 31.7 30.0 - 36.0 g/dL   RDW 33.2 95.1 - 88.4 %   Platelets 271 150 - 400 K/uL   nRBC 0.0 0.0 - 0.2 %   Neutrophils Relative % 57 %   Neutro Abs 3.4 1.7 - 7.7 K/uL   Lymphocytes Relative 30 %   Lymphs Abs 1.7 0.7 - 4.0 K/uL    Monocytes Relative 10 %   Monocytes Absolute 0.6 0.1 - 1.0 K/uL   Eosinophils Relative 2 %   Eosinophils Absolute 0.1 0.0 - 0.5 K/uL   Basophils Relative 1 %   Basophils Absolute 0.0 0.0 - 0.1 K/uL   Immature Granulocytes 0 %   Abs Immature Granulocytes 0.01 0.00 - 0.07 K/uL  Comprehensive metabolic panel  Result Value Ref Range   Sodium 141 135 - 145 mmol/L   Potassium 3.2 (L) 3.5 - 5.1 mmol/L   Chloride 103 98 - 111 mmol/L   CO2 27 22 - 32 mmol/L   Glucose, Bld 120 (H) 70 - 99 mg/dL   BUN 8 6 - 20 mg/dL   Creatinine, Ser 1.66 0.44 - 1.00 mg/dL   Calcium 8.8 (L) 8.9 - 10.3 mg/dL   Total Protein 7.1 6.5 - 8.1 g/dL   Albumin 3.6 3.5 - 5.0 g/dL   AST 14 (L) 15 - 41 U/L   ALT 8 0 - 44 U/L   Alkaline Phosphatase 64 38 - 126 U/L   Total Bilirubin 0.9 0.3 - 1.2 mg/dL   GFR, Estimated >06 >30 mL/min   Anion gap 11 5 - 15  Lipase, blood  Result Value Ref Range   Lipase 28 11 - 51 U/L  Urinalysis, Routine w reflex microscopic -Urine, Clean Catch  Result Value Ref Range   Color, Urine YELLOW (A) YELLOW   APPearance CLEAR (A) CLEAR   Specific Gravity, Urine 1.008 1.005 - 1.030   pH 7.0 5.0 - 8.0   Glucose, UA NEGATIVE NEGATIVE mg/dL   Hgb urine dipstick NEGATIVE NEGATIVE   Bilirubin Urine NEGATIVE NEGATIVE   Ketones, ur NEGATIVE NEGATIVE mg/dL   Protein, ur NEGATIVE NEGATIVE mg/dL   Nitrite NEGATIVE NEGATIVE   Leukocytes,Ua NEGATIVE NEGATIVE  Magnesium  Result Value Ref Range   Magnesium 1.8 1.7 - 2.4 mg/dL  Troponin I (High Sensitivity)  Result Value Ref Range   Troponin I (High Sensitivity) 8 <18 ng/L  Troponin I (High Sensitivity)  Result Value Ref Range   Troponin I (High Sensitivity) 9 <18 ng/L      Assessment & Plan:   Problem List Items Addressed This Visit       Cardiovascular and Mediastinum   Hypertension    Chronic.  Improved.  Patient endorses taking medication.  Continue with current regimen.       Relevant Orders   Comp Met (CMET)     Respiratory    COPD (chronic obstructive pulmonary disease) (HCC) - Primary    Chronic. Improved.  Suspect she needs oxygen at home.  Will refer to Pulmonology.  Refilled Albuterol.  Reviewed PRN use.  Reviewed appropriate use of Stioloto.  Unable to read Spirometry today.        Relevant Medications   albuterol (VENTOLIN HFA) 108 (90 Base) MCG/ACT inhaler   Other Relevant Orders   Spirometry with Graph (Completed)   Spirometry with graph (Completed)   Ambulatory referral to Pulmonology     Other   Gout    Active flare.  Will refill colchicine.         Follow up plan: Return in about 1 month (around 12/30/2022) for HTN, HLD, DM2 FU.

## 2022-11-30 NOTE — Assessment & Plan Note (Signed)
Active flare.  Will refill colchicine.

## 2022-11-30 NOTE — Assessment & Plan Note (Signed)
Chronic.  Improved.  Patient endorses taking medication.  Continue with current regimen.

## 2022-11-30 NOTE — Assessment & Plan Note (Signed)
Chronic. Improved.  Suspect she needs oxygen at home.  Will refer to Pulmonology.  Refilled Albuterol.  Reviewed PRN use.  Reviewed appropriate use of Stioloto.  Unable to read Spirometry today.

## 2022-12-01 LAB — COMPREHENSIVE METABOLIC PANEL
ALT: 9 [IU]/L (ref 0–32)
AST: 13 [IU]/L (ref 0–40)
Albumin: 4.3 g/dL (ref 3.8–4.9)
Alkaline Phosphatase: 83 [IU]/L (ref 44–121)
BUN/Creatinine Ratio: 17 (ref 9–23)
BUN: 14 mg/dL (ref 6–24)
Bilirubin Total: 0.7 mg/dL (ref 0.0–1.2)
CO2: 24 mmol/L (ref 20–29)
Calcium: 9.5 mg/dL (ref 8.7–10.2)
Chloride: 102 mmol/L (ref 96–106)
Creatinine, Ser: 0.83 mg/dL (ref 0.57–1.00)
Globulin, Total: 2.9 g/dL (ref 1.5–4.5)
Glucose: 112 mg/dL — ABNORMAL HIGH (ref 70–99)
Potassium: 3.7 mmol/L (ref 3.5–5.2)
Sodium: 144 mmol/L (ref 134–144)
Total Protein: 7.2 g/dL (ref 6.0–8.5)
eGFR: 83 mL/min/{1.73_m2} (ref >59)

## 2022-12-03 ENCOUNTER — Other Ambulatory Visit: Payer: Self-pay | Admitting: Pediatrics

## 2022-12-03 DIAGNOSIS — F419 Anxiety disorder, unspecified: Secondary | ICD-10-CM

## 2022-12-06 NOTE — Telephone Encounter (Signed)
Requested Prescriptions  Pending Prescriptions Disp Refills   citalopram (CELEXA) 10 MG tablet [Pharmacy Med Name: CITALOPRAM HBR 10 MG TABLET] 90 tablet 2    Sig: TAKE 1 TABLET BY MOUTH EVERY DAY     Psychiatry:  Antidepressants - SSRI Passed - 12/03/2022 12:30 PM      Passed - Completed PHQ-2 or PHQ-9 in the last 360 days      Passed - Valid encounter within last 6 months    Recent Outpatient Visits           6 days ago Chronic obstructive pulmonary disease, unspecified COPD type (HCC)   [place] [facility] Larae Grooms, NP   3 weeks ago Chronic obstructive pulmonary disease, unspecified COPD type [facility])   [facility] Jackolyn Confer, MD   2 months ago Follow-up exam   [place] [facility] Mecum, Oswaldo Conroy, PA-C   5 months ago Essential hypertension   [place] [facility] Larae Grooms, NP   12 months ago Chronic diastolic heart failure [facility])   [place] [facility] Larae Grooms, NP       Future Appointments             In 3 weeks Larae Grooms, NP  [facility]

## 2022-12-08 ENCOUNTER — Encounter: Payer: Self-pay | Admitting: Internal Medicine

## 2022-12-14 ENCOUNTER — Other Ambulatory Visit: Payer: Self-pay | Admitting: Nurse Practitioner

## 2022-12-15 NOTE — Telephone Encounter (Signed)
Requested Prescriptions  Pending Prescriptions Disp Refills   pantoprazole (PROTONIX) 40 MG tablet [Pharmacy Med Name: PANTOPRAZOLE SOD DR 40 MG TAB] 90 tablet 3    Sig: TAKE 1 TABLET BY MOUTH EVERY DAY     Gastroenterology: Proton Pump Inhibitors Passed - 12/14/2022  1:27 AM      Passed - Valid encounter within last 12 months    Recent Outpatient Visits           2 weeks ago Chronic obstructive pulmonary disease, unspecified COPD type (HCC)   [place] [facility] Larae Grooms, NP   1 month ago Chronic obstructive pulmonary disease, unspecified COPD type (HCC)   [place] [facility] Jackolyn Confer, MD   2 months ago Follow-up exam   [place] [facility] Mecum, Oswaldo Conroy, PA-C   5 months ago Essential hypertension   [place] [facility] Larae Grooms, NP   1 year ago Chronic diastolic heart failure [facility])   [place] [facility] Larae Grooms, NP       Future Appointments             In 2 weeks Larae Grooms, NP [facility]

## 2022-12-30 ENCOUNTER — Ambulatory Visit: Payer: Medicaid Other | Admitting: Nurse Practitioner

## 2022-12-31 ENCOUNTER — Ambulatory Visit (INDEPENDENT_AMBULATORY_CARE_PROVIDER_SITE_OTHER): Payer: Medicaid Other | Admitting: Nurse Practitioner

## 2022-12-31 ENCOUNTER — Encounter: Payer: Self-pay | Admitting: Nurse Practitioner

## 2022-12-31 DIAGNOSIS — R7301 Impaired fasting glucose: Secondary | ICD-10-CM | POA: Diagnosis not present

## 2022-12-31 DIAGNOSIS — E785 Hyperlipidemia, unspecified: Secondary | ICD-10-CM | POA: Diagnosis not present

## 2022-12-31 DIAGNOSIS — F418 Other specified anxiety disorders: Secondary | ICD-10-CM

## 2022-12-31 DIAGNOSIS — Z1231 Encounter for screening mammogram for malignant neoplasm of breast: Secondary | ICD-10-CM

## 2022-12-31 DIAGNOSIS — I10 Essential (primary) hypertension: Secondary | ICD-10-CM

## 2022-12-31 DIAGNOSIS — J449 Chronic obstructive pulmonary disease, unspecified: Secondary | ICD-10-CM

## 2022-12-31 DIAGNOSIS — I5032 Chronic diastolic (congestive) heart failure: Secondary | ICD-10-CM

## 2022-12-31 NOTE — Assessment & Plan Note (Signed)
Chronic.  Controlled.  Continue with current medication regimen.  Labs ordered today.  Return to clinic in 3 months for reevaluation.  Call sooner if concerns arise.   

## 2022-12-31 NOTE — Progress Notes (Signed)
BP 138/82 Comment: Home blood pressure reading  Pulse 85   Temp 97.8 F (36.6 C) (Oral)   Ht 5\' 10"  (1.778 m)   Wt 231 lb 9.6 oz (105.1 kg)   SpO2 97%   BMI 33.23 kg/m    Subjective:    Patient ID: [name], female    DOB: Nov 06, 1965, 57 y.o.   MRN: [id number]  HPI: [name] is a 57 y.o. female  Chief Complaint  Patient presents with   1 MONTH FOLLOW UP   Hypertension   OXYGEN Patient states she has not made an appt with Pulmonology.  Not on portable oxygen.  Breathing well in office today.    HYPERTENSION / HYPERLIPIDEMIA Patient states she does not weigh daily.   Has not seen cardiology since last visit.  Patient is having to sleep with 5 pillows.  ECHO in August 2024-EF of 55-60%.  Satisfied with current treatment? yes Duration of hypertension: years BP monitoring frequency: not checking BP range: 138/80 BP medication side effects: no Past BP meds:  hydralazine clonidine, amlodipine, and lisinopril Duration of hyperlipidemia: years Cholesterol medication side effects: no Cholesterol supplements: none Past cholesterol medications: atorvastain (lipitor),  Medication compliance: excellent compliance Aspirin: yes Recent stressors: no Recurrent headaches: no Visual changes: no Palpitations: no Dyspnea: yes Chest pain: no Lower extremity edema: no Dizzy/lightheaded: no  COPD Not using Spiriva consistently.  COPD status: improved Satisfied with current treatment?: no Oxygen use: no - would like oxygen. She feels like she would benefit from oxygen to help her increase her ability to walk further Dyspnea frequency: daily Cough frequency: occasionally Rescue inhaler frequency:  a couple times a week Limitation of activity: yes Productive cough:  Last Spirometry:  Pneumovax: Up to Date Influenza: Not up to Date  ANXIETY Patient states her anxiety has been better.  She feels like she gets bored and doesn't have a lot of visitors.  Flowsheet Row  Office Visit from 12/31/2022 in [facility]  PHQ-9 Total Score 12         12/31/2022   11:25 AM 11/30/2022   10:54 AM 11/10/2022    9:56 AM 07/06/2022   11:22 AM  GAD 7 : Generalized Anxiety Score  Nervous, Anxious, on Edge 3 2 2 1   Control/stop worrying 3 2 2 1   Worry too much - different things 3 3 3 1   Trouble relaxing 3 2 2 1   Restless 2 2 1 1   Easily annoyed or irritable 1 2 1 1   Afraid - awful might happen 2  2 2   Total GAD 7 Score 17  13 8   Anxiety Difficulty   Somewhat difficult Somewhat difficult    Relevant past medical, surgical, family and social history reviewed and updated as indicated. Interim medical history since our last visit reviewed. Allergies and medications reviewed and updated.  Review of Systems  Eyes:  Negative for visual disturbance.  Respiratory:  Positive for shortness of breath. Negative for cough and chest tightness.   Cardiovascular:  Negative for chest pain, palpitations and leg swelling.  Neurological:  Negative for dizziness and headaches.  Psychiatric/Behavioral:  Positive for dysphoric mood. Negative for suicidal ideas. The patient is nervous/anxious.     Per HPI unless specifically indicated above     Objective:    BP 138/82 Comment: Home blood pressure reading  Pulse 85   Temp 97.8 F (36.6 C) (Oral)   Ht 5\' 10"  (1.778 m)   Wt 231 lb  9.6 oz (105.1 kg)   SpO2 97%   BMI 33.23 kg/m   Wt Readings from Last 3 Encounters:  12/31/22 231 lb 9.6 oz (105.1 kg)  11/30/22 225 lb 6.4 oz (102.2 kg)  11/11/22 232 lb (105.2 kg)    Physical Exam Vitals and nursing note reviewed.  Constitutional:      General: She is not in acute distress.    Appearance: Normal appearance. She is normal weight. She is not ill-appearing, toxic-appearing or diaphoretic.  HENT:     Head: Normocephalic.     Right Ear: External ear normal.     Left Ear: External ear normal.     Nose: Nose normal.     Mouth/Throat:     Mouth: Mucous  membranes are moist.     Pharynx: Oropharynx is clear.  Eyes:     General:        Right eye: No discharge.        Left eye: No discharge.     Extraocular Movements: Extraocular movements intact.     Conjunctiva/sclera: Conjunctivae normal.     Pupils: Pupils are equal, round, and reactive to light.  Cardiovascular:     Rate and Rhythm: Normal rate. Rhythm irregular.     Heart sounds: No murmur heard. Pulmonary:     Effort: Pulmonary effort is normal. No respiratory distress.     Breath sounds: Normal breath sounds. No wheezing or rales.  Musculoskeletal:     Cervical back: Normal range of motion and neck supple.     Right lower leg: No edema.     Left lower leg: No edema.  Skin:    General: Skin is warm and dry.     Capillary Refill: Capillary refill takes less than 2 seconds.  Neurological:     General: No focal deficit present.     Mental Status: She is alert and oriented to person, place, and time. Mental status is at baseline.  Psychiatric:        Mood and Affect: Mood normal.        Behavior: Behavior normal.        Thought Content: Thought content normal.        Judgment: Judgment normal.     Results for orders placed or performed in visit on 11/30/22  Comp Met (CMET)  Result Value Ref Range   Glucose 112 (H) 70 - 99 mg/dL   BUN 14 6 - 24 mg/dL   Creatinine, Ser 0.86 0.57 - 1.00 mg/dL   eGFR 83 >57 QI/ONG/2.95   BUN/Creatinine Ratio 17 9 - 23   Sodium 144 134 - 144 mmol/L   Potassium 3.7 3.5 - 5.2 mmol/L   Chloride 102 96 - 106 mmol/L   CO2 24 20 - 29 mmol/L   Calcium 9.5 8.7 - 10.2 mg/dL   Total Protein 7.2 6.0 - 8.5 g/dL   Albumin 4.3 3.8 - 4.9 g/dL   Globulin, Total 2.9 1.5 - 4.5 g/dL   Bilirubin Total 0.7 0.0 - 1.2 mg/dL   Alkaline Phosphatase 83 44 - 121 IU/L   AST 13 0 - 40 IU/L   ALT 9 0 - 32 IU/L      Assessment & Plan:   Problem List Items Addressed This Visit       Cardiovascular and Mediastinum   Chronic diastolic heart failure (HCC)  (Chronic)    Chronic. Has not followed up with HF clinic.  Does have an appt in March.  Will see if  patient can be scheduled sooner.  Discussed with patient the importance of taking medications regularly.  On lasix 20mg  daily.  Labs ordered today.  Will needs to keep 3 month follow up for further refills. Not weighing herself daily.  Swelling has improved since last visit but continues to sleep with 5 pillows at night.   Patient did not keep appt made for her during last visit.  - Reminded to call for an overnight weight gain of >2 pounds or a weekly weight gain of >5 pounds - not adding salt to food and read food labels. Reviewed the importance of keeping daily sodium intake to 2000mg  daily. - Avoid Ibuprofen products.      Hypertension    Chronic.  Improved.  Patient endorses taking medication and checking blood pressure home.  Continue with Clonidine, hydralazine, amlodipine and lisinopril.  Continue with current regimen. Labs ordered today.  Follow up in 3 months.  Call sooner if concerns arise.       Relevant Orders   Comp Met (CMET)     Respiratory   COPD (chronic obstructive pulmonary disease) (HCC)    Chronic. Improved.  Suspect she needs oxygen at home.  Will refer to Pulmonology- has an appt in January (reminded patient today).  Reviewed PRN use.  Reviewed appropriate use of maintenance inhaler. Follow up in 3 months.  Call sooner if concerns arise.         Endocrine   IFG (impaired fasting glucose)    Labs ordered at visit today.  Will make recommendations based on lab results.        Relevant Orders   HgB A1c     Other   Hyperlipidemia    Chronic.  Controlled.  Continue with current medication regimen.  Labs ordered today.  Return to clinic in 3 months for reevaluation.  Call sooner if concerns arise.       Relevant Orders   Lipid Profile   Depression with anxiety    Chronic. Controlled without medication.   Has been prescribed Celexa in the past but didn't take it.   Declines referral to psychiatry. Follow up in 3 months.  Call sooner if concerns arise.       Morbid obesity (HCC) - Primary    Recommended eating smaller high protein, low fat meals more frequently and exercising 30 mins a day 5 times a week with a goal of 10-15lb weight loss in the next 3 months.       Other Visit Diagnoses     Encounter for screening mammogram for malignant neoplasm of breast       Relevant Orders   MM 3D SCREENING MAMMOGRAM BILATERAL BREAST        Follow up plan: Return in about 3 months (around 03/31/2023) for HTN, HLD, DM2 FU.

## 2022-12-31 NOTE — Progress Notes (Signed)
Scheduled appointment on 03/31/2023 @ 10:40 am.

## 2022-12-31 NOTE — Assessment & Plan Note (Signed)
Chronic. Has not followed up with HF clinic.  Does have an appt in March.  Will see if patient can be scheduled sooner.  Discussed with patient the importance of taking medications regularly.  On lasix 20mg  daily.  Labs ordered today.  Will needs to keep 3 month follow up for further refills. Not weighing herself daily.  Swelling has improved since last visit but continues to sleep with 5 pillows at night.   Patient did not keep appt made for her during last visit.  - Reminded to call for an overnight weight gain of >2 pounds or a weekly weight gain of >5 pounds - not adding salt to food and read food labels. Reviewed the importance of keeping daily sodium intake to 2000mg  daily. - Avoid Ibuprofen products.

## 2022-12-31 NOTE — Assessment & Plan Note (Signed)
Chronic. Controlled without medication.   Has been prescribed Celexa in the past but didn't take it.  Declines referral to psychiatry. Follow up in 3 months.  Call sooner if concerns arise.

## 2022-12-31 NOTE — Assessment & Plan Note (Signed)
Chronic. Improved.  Suspect she needs oxygen at home.  Will refer to Pulmonology- has an appt in January (reminded patient today).  Reviewed PRN use.  Reviewed appropriate use of maintenance inhaler. Follow up in 3 months.  Call sooner if concerns arise.

## 2022-12-31 NOTE — Assessment & Plan Note (Signed)
Recommended eating smaller high protein, low fat meals more frequently and exercising 30 mins a day 5 times a week with a goal of 10-15lb weight loss in the next 3 months.  

## 2022-12-31 NOTE — Assessment & Plan Note (Signed)
Chronic.  Improved.  Patient endorses taking medication and checking blood pressure home.  Continue with Clonidine, hydralazine, amlodipine and lisinopril.  Continue with current regimen. Labs ordered today.  Follow up in 3 months.  Call sooner if concerns arise.

## 2022-12-31 NOTE — Assessment & Plan Note (Signed)
Labs ordered at visit today.  Will make recommendations based on lab results.   

## 2023-01-01 LAB — COMPREHENSIVE METABOLIC PANEL
ALT: 8 [IU]/L (ref 0–32)
AST: 13 [IU]/L (ref 0–40)
Albumin: 4.1 g/dL (ref 3.8–4.9)
Alkaline Phosphatase: 93 [IU]/L (ref 44–121)
BUN/Creatinine Ratio: 12 (ref 9–23)
BUN: 9 mg/dL (ref 6–24)
Bilirubin Total: 1 mg/dL (ref 0.0–1.2)
CO2: 24 mmol/L (ref 20–29)
Calcium: 9.2 mg/dL (ref 8.7–10.2)
Chloride: 103 mmol/L (ref 96–106)
Creatinine, Ser: 0.75 mg/dL (ref 0.57–1.00)
Globulin, Total: 2.8 g/dL (ref 1.5–4.5)
Glucose: 89 mg/dL (ref 70–99)
Potassium: 3.3 mmol/L — ABNORMAL LOW (ref 3.5–5.2)
Sodium: 142 mmol/L (ref 134–144)
Total Protein: 6.9 g/dL (ref 6.0–8.5)
eGFR: 93 mL/min/{1.73_m2} (ref >59)

## 2023-01-01 LAB — LIPID PANEL
Chol/HDL Ratio: 3.6 {ratio} (ref 0.0–4.4)
Cholesterol, Total: 226 mg/dL — ABNORMAL HIGH (ref 100–199)
HDL: 62 mg/dL (ref >39)
LDL Chol Calc (NIH): 145 mg/dL — ABNORMAL HIGH (ref 0–99)
Triglycerides: 105 mg/dL (ref 0–149)
VLDL Cholesterol Cal: 19 mg/dL (ref 5–40)

## 2023-01-01 LAB — HEMOGLOBIN A1C
Est. average glucose Bld gHb Est-mCnc: 137 mg/dL
Hgb A1c MFr Bld: 6.4 % — ABNORMAL HIGH (ref 4.8–5.6)

## 2023-01-03 NOTE — Addendum Note (Signed)
Addended by: Larae Grooms on: 01/03/2023 11:00 AM   Modules accepted: Orders

## 2023-01-27 ENCOUNTER — Institutional Professional Consult (permissible substitution): Payer: Medicaid Other | Admitting: Pulmonary Disease

## 2023-02-07 ENCOUNTER — Other Ambulatory Visit: Payer: Self-pay | Admitting: Nurse Practitioner

## 2023-02-07 ENCOUNTER — Ambulatory Visit: Payer: Self-pay | Admitting: *Deleted

## 2023-02-07 NOTE — Telephone Encounter (Signed)
  Chief Complaint: left leg swelling , redness, requesting refill of colchicine . Symptoms: left leg swelling foot and ankle. C/o gout flare up. Left great toe red. Took 2 naproxen  and pain level now 7. Pain with walking .  Frequency: 3 weeks ago  Pertinent Negatives: Patient denies chest pain no difficulty breathing no fever Disposition: [] ED /[] Urgent Care (no appt availability in office) / [x] Appointment(In office/virtual)/ []  [facility]/ [] Home Care/ [] Refused Recommended Disposition /[] [place] [facility]/ []  Follow-up with PCP Additional Notes:   No appt with PCP. Scheduled appt for tomorrow . Please advise if patient can be seen today . Patient requesting refill for colchicine .  Please advise .    Summary: swelling   Pt called in , left leg is swollen              Reason for Disposition  [1] Red area or streak [2] large (> 2 in. or 5 cm)  Answer Assessment - Initial Assessment Questions 1. ONSET: When did the swelling start? (e.g., minutes, hours, days)     3 weeks ago  2. LOCATION: What part of the leg is swollen?  Are both legs swollen or just one leg?     Left leg foot and ankle  3. SEVERITY: How bad is the swelling? (e.g., localized; mild, moderate, severe)   - Localized: Small area of swelling localized to one leg.   - MILD pedal edema: Swelling limited to foot and ankle, pitting edema < 1/4 inch (6 mm) deep, rest and elevation eliminate most or all swelling.   - MODERATE edema: Swelling of lower leg to knee, pitting edema > 1/4 inch (6 mm) deep, rest and elevation only partially reduce swelling.   - SEVERE edema: Swelling extends above knee, facial or hand swelling present.     Foot and ankle  4. REDNESS: Does the swelling look red or infected?     Redness  5. PAIN: Is the swelling painful to touch? If Yes, ask: How painful is it?   (Scale 1-10; mild, moderate or severe)     7 after taking naproxen   6. FEVER: Do you have a fever? If  Yes, ask: What is it, how was it measured, and when did it start?      Na  7. CAUSE: What do you think is causing the leg swelling?     gout 8. MEDICAL HISTORY: Do you have a history of blood clots (e.g., DVT), cancer, heart failure, kidney disease, or liver failure?     Na  9. RECURRENT SYMPTOM: Have you had leg swelling before? If Yes, ask: When was the last time? What happened that time?     Yes  10. OTHER SYMPTOMS: Do you have any other symptoms? (e.g., chest pain, difficulty breathing)       No  difficulty breathing , left great toe red pain .  11. PREGNANCY: Is there any chance you are pregnant? When was your last menstrual period?       na  Protocols used: Leg Swelling and Edema-A-AH

## 2023-02-07 NOTE — Telephone Encounter (Signed)
 Summary: swelling   Pt called in , left leg is swollen        Called patient [phone] to review left leg swelling. Unsure if someone answered. Attempted to leave message [facility] calling and to please call back at #[phone].

## 2023-02-07 NOTE — Telephone Encounter (Signed)
 FYI: Patient has appt 02/08/2023 at 8:20 AM

## 2023-02-07 NOTE — Telephone Encounter (Signed)
 Medication Refill -  Most Recent Primary Care Visit:  Provider: MELVIN PAO  Department: [facility]  Visit Type: OFFICE VISIT  Date: 12/31/2022  Medication: colchicine  0.6 MG tablet   Has the patient contacted their pharmacy? Yes Agent: If yes, when and what did the pharmacy advise?)pt said called in 1 month ago to pharmacy and never received  Is this the correct pharmacy for this prescription? yes If no, delete pharmacy and type the correct one.  This is the patient's preferred pharmacy:   [id number] - [place], [place] - [address] [place] [address] Phone: [phone] Fax: [phone]   Has the prescription been filled recently? no  Is the patient out of the medication? yes  Has the patient been seen for an appointment in the last year OR does the patient have an upcoming appointment? yes  Can we respond through MyChart? yes  Agent: Please be advised that Rx refills may take up to 3 business days. We ask that you follow-up with your pharmacy.

## 2023-02-08 ENCOUNTER — Ambulatory Visit: Payer: Medicaid Other

## 2023-02-08 ENCOUNTER — Ambulatory Visit (INDEPENDENT_AMBULATORY_CARE_PROVIDER_SITE_OTHER): Payer: Medicaid Other | Admitting: Nurse Practitioner

## 2023-02-08 ENCOUNTER — Encounter: Payer: Self-pay | Admitting: Nurse Practitioner

## 2023-02-08 VITALS — BP 130/89 | HR 71 | Temp 97.4°F | Wt 237.0 lb

## 2023-02-08 DIAGNOSIS — M7989 Other specified soft tissue disorders: Secondary | ICD-10-CM | POA: Diagnosis not present

## 2023-02-08 MED ORDER — COLCHICINE 0.6 MG PO TABS: 0.6000 mg | ORAL_TABLET | Freq: Every day | ORAL | 2 refills | Status: DC

## 2023-02-08 NOTE — Assessment & Plan Note (Signed)
 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  Will send in refills on Colchicine, however will also obtain imaging to ensure no clot present due to her history and physical exam findings.  Discussed plan with patient.

## 2023-02-08 NOTE — Patient Instructions (Signed)
 Gout  Gout is painful swelling of your joints. Gout is a type of arthritis. It is caused by having too much uric acid in your body. Uric acid is a chemical that is made when your body breaks down substances called purines. If your body has too much uric acid, sharp crystals can form and build up in your joints. This causes pain and swelling. Gout attacks can happen quickly and be very painful (acute gout). Over time, the attacks can affect more joints and happen more often (chronic gout). What are the causes? Gout is caused by too much uric acid in your blood. This can happen because: Your kidneys do not remove enough uric acid from your blood. Your body makes too much uric acid. You eat too many foods that are high in purines. These foods include organ meats, some seafood, and beer. Trauma or stress can bring on an attack. What increases the risk? Having a family history of gout. Being female and middle-aged. Being female and having gone through menopause. Having an organ transplant. Taking certain medicines. Having certain conditions, such as: Being very overweight (obese). Lead poisoning. Kidney disease. A skin condition called psoriasis. Other risks include: Losing weight too quickly. Not having enough water in the body (being dehydrated). Drinking alcohol, especially beer. Drinking beverages that are sweetened with a type of sugar called fructose. What are the signs or symptoms? An attack of acute gout often starts at night and usually happens in just one joint. The most common place is the big toe. Other joints that may be affected include joints of the feet, ankle, knee, fingers, wrist, or elbow. Symptoms may include: Very bad pain. Warmth. Swelling. Stiffness. Tenderness. The affected joint may be very painful to touch. Shiny, red, or purple skin. Chills and fever. Chronic gout may cause symptoms more often. More joints may be involved. You may also have white or yellow lumps  (tophi) on your hands or feet or in other areas near your joints. How is this treated? Treatment for an acute attack may include medicines for pain and swelling, such as: NSAIDs, such as ibuprofen. Steroids taken by mouth or injected into a joint. Colchicine. This can be given by mouth or through an IV tube. Treatment to prevent future attacks may include: Taking small doses of NSAIDs or colchicine daily. Using a medicine that reduces uric acid levels in your blood, such as allopurinol. Making changes to your diet. You may need to see a food expert (dietitian) about what to eat and drink to prevent gout. Follow these instructions at home: During a gout attack  If told, put ice on the painful area. To do this: Put ice in a plastic bag. Place a towel between your skin and the bag. Leave the ice on for 20 minutes, 2-3 times a day. Take off the ice if your skin turns bright red. This is very important. If you cannot feel pain, heat, or cold, you have a greater risk of damage to the area. Raise the painful joint above the level of your heart as often as you can. Rest the joint as much as possible. If the joint is in your leg, you may be given crutches. Follow instructions from your doctor about what you cannot eat or drink. Avoiding future gout attacks Eat a low-purine diet. Avoid foods and drinks such as: Liver. Kidney. Anchovies. Asparagus. Herring. Mushrooms. Mussels. Beer. Stay at a healthy weight. If you want to lose weight, talk with your doctor. Do not  lose weight too fast. Start or continue an exercise plan as told by your doctor. Eating and drinking Avoid drinks sweetened by fructose. Drink enough fluids to keep your pee (urine) pale yellow. If you drink alcohol: Limit how much you have to: 0-1 drink a day for women who are not pregnant. 0-2 drinks a day for men. Know how much alcohol is in a drink. In the [place] one drink equals one 12 oz bottle of beer (355 mL), one 5 oz  glass of wine (148 mL), or one 1 oz glass of hard liquor (44 mL). General instructions Take over-the-counter and prescription medicines only as told by your doctor. Ask your doctor if you should avoid driving or using machines while you are taking your medicine. Return to your normal activities when your doctor says that it is safe. Keep all follow-up visits. Where to find more information [employer]: www.niams.http://www.myers.net/ Contact a doctor if: You have another gout attack. You still have symptoms of a gout attack after 10 days of treatment. You have problems (side effects) because of your medicines. You have chills or a fever. You have burning pain when you pee (urinate). You have pain in your lower back or belly. Get help right away if: You have very bad pain. Your pain cannot be controlled. You cannot pee. Summary Gout is painful swelling of the joints. The most common site of pain is the big toe, but it can affect other joints. Medicines and avoiding some foods can help to prevent and treat gout attacks. This information is not intended to replace advice given to you by your health care provider. Make sure you discuss any questions you have with your health care provider. Document Revised: 10/15/2020 Document Reviewed: 10/15/2020 Elsevier Patient Education  2024 [employer].

## 2023-02-08 NOTE — Progress Notes (Signed)
 s  BP 130/89   Pulse 71   Temp (!) 97.4 F (36.3 C) (Oral)   Wt 237 lb (107.5 kg)   SpO2 95%   BMI 34.01 kg/m    Subjective:    Patient ID: [name], female    DOB: 19-Jun-1965, 58 y.o.   MRN: [id number]  HPI: [name] is a 58 y.o. female  Chief Complaint  Patient presents with   Leg Swelling    Patient states she has been having swelling, pain, and redness in her L leg for the last week   GOUT Left leg swelling last week, she reports this is gout flare.  Took a couple of neighbors pills as she was out and this did help some - Colchicine , but is out of these.  Has not had a flare in years.  Does have Allopurinol  at home and is taking this daily.  Has history of blood clots to left leg she reports which were treated in past, but on 2021 imaging showed no clots and she is not on blood thinner.   Duration:chronic Right 1st metatarsophalangeal pain: no Left 1st metatarsophalangeal pain: left leg Right knee pain: no Left knee pain: no Severity: 7/10  Quality: dull, aching, and burning Swelling: yes Redness: yes Trauma: no Recent dietary change or indiscretion: no Fevers: no Nausea/vomiting: no Aggravating factors: unknown Alleviating factors: Colchicine  Status:  fluctuating Treatments attempted: as above   Relevant past medical, surgical, family and social history reviewed and updated as indicated. Interim medical history since our last visit reviewed. Allergies and medications reviewed and updated.  Review of Systems  Constitutional:  Negative for activity change, appetite change, diaphoresis, fatigue and fever.  Respiratory:  Negative for cough, chest tightness, shortness of breath and wheezing.   Cardiovascular:  Positive for leg swelling. Negative for chest pain and palpitations.  Gastrointestinal: Negative.   Musculoskeletal:  Positive for arthralgias.  Neurological: Negative.   Psychiatric/Behavioral: Negative.      Per HPI unless specifically  indicated above     Objective:    BP 130/89   Pulse 71   Temp (!) 97.4 F (36.3 C) (Oral)   Wt 237 lb (107.5 kg)   SpO2 95%   BMI 34.01 kg/m   Wt Readings from Last 3 Encounters:  02/08/23 237 lb (107.5 kg)  12/31/22 231 lb 9.6 oz (105.1 kg)  11/30/22 225 lb 6.4 oz (102.2 kg)    Physical Exam Vitals and nursing note reviewed.  Constitutional:      General: She is awake. She is not in acute distress.    Appearance: She is well-developed and well-groomed. She is obese. She is not Randall-appearing or toxic-appearing.  HENT:     Head: Normocephalic.     Right Ear: Hearing and external ear normal.     Left Ear: Hearing and external ear normal.  Eyes:     General: Lids are normal.        Right eye: No discharge.        Left eye: No discharge.     Conjunctiva/sclera: Conjunctivae normal.     Pupils: Pupils are equal, round, and reactive to light.  Neck:     Thyroid: No thyromegaly.     Vascular: No carotid bruit.  Cardiovascular:     Rate and Rhythm: Normal rate and regular rhythm.     Heart sounds: Normal heart sounds. No murmur heard.    No gallop.     Comments: Some discomfort with Homan's to  left, none right. Minimal warmth to left lower extremity or redness.  Swelling L>R.   Pulmonary:     Effort: Pulmonary effort is normal. No accessory muscle usage or respiratory distress.     Breath sounds: Normal breath sounds.  Abdominal:     General: Bowel sounds are normal. There is no distension.     Palpations: Abdomen is soft.     Tenderness: There is no abdominal tenderness.  Musculoskeletal:     Cervical back: Normal range of motion and neck supple.     Right lower leg: 1+ Edema present.     Left lower leg: 2+ Edema present.  Lymphadenopathy:     Cervical: No cervical adenopathy.  Skin:    General: Skin is warm and dry.  Neurological:     Mental Status: She is alert and oriented to person, place, and time.     Deep Tendon Reflexes: Reflexes are normal and symmetric.      Reflex Scores:      Brachioradialis reflexes are 2+ on the right side and 2+ on the left side.      Patellar reflexes are 2+ on the right side and 2+ on the left side. Psychiatric:        Attention and Perception: Attention normal.        Mood and Affect: Mood normal.        Speech: Speech normal.        Behavior: Behavior normal. Behavior is cooperative.        Thought Content: Thought content normal.    Results for orders placed or performed in visit on 12/31/22  Comp Met (CMET)   Collection Time: 12/31/22 11:38 AM  Result Value Ref Range   Glucose 89 70 - 99 mg/dL   BUN 9 6 - 24 mg/dL   Creatinine, Ser 9.24 0.57 - 1.00 mg/dL   eGFR 93 >40 fO/fpw/8.26   BUN/Creatinine Ratio 12 9 - 23   Sodium 142 134 - 144 mmol/L   Potassium 3.3 (L) 3.5 - 5.2 mmol/L   Chloride 103 96 - 106 mmol/L   CO2 24 20 - 29 mmol/L   Calcium  9.2 8.7 - 10.2 mg/dL   Total Protein 6.9 6.0 - 8.5 g/dL   Albumin 4.1 3.8 - 4.9 g/dL   Globulin, Total 2.8 1.5 - 4.5 g/dL   Bilirubin Total 1.0 0.0 - 1.2 mg/dL   Alkaline Phosphatase 93 44 - 121 IU/L   AST 13 0 - 40 IU/L   ALT 8 0 - 32 IU/L  Lipid Profile   Collection Time: 12/31/22 11:38 AM  Result Value Ref Range   Cholesterol, Total 226 (H) 100 - 199 mg/dL   Triglycerides 894 0 - 149 mg/dL   HDL 62 >60 mg/dL   VLDL Cholesterol Cal 19 5 - 40 mg/dL   LDL Chol Calc (NIH) 854 (H) 0 - 99 mg/dL   Chol/HDL Ratio 3.6 0.0 - 4.4 ratio  HgB A1c   Collection Time: 12/31/22 11:38 AM  Result Value Ref Range   Hgb A1c MFr Bld 6.4 (H) 4.8 - 5.6 %   Est. average glucose Bld gHb Est-mCnc 137 mg/dL      Assessment & Plan:   Problem List Items Addressed This Visit       Other   Left leg swelling - Primary   Acute onset one week ago, with history of gout and history of DVTs reported.  Will send in refills on Colchicine , however will also obtain imaging  to ensure no clot present due to her history and physical exam findings.  Discussed plan with patient.       Relevant Orders   US  Venous Img Lower Unilateral Left (DVT)     Follow up plan: Return for as scheduled in March with Darice.

## 2023-03-30 ENCOUNTER — Telehealth: Payer: Self-pay | Admitting: Family

## 2023-03-30 NOTE — Telephone Encounter (Signed)
 Pt confirmed appt for 03/31/23

## 2023-03-30 NOTE — Progress Notes (Deleted)
 Advanced Heart Failure Clinic Note    PCP: Larae Grooms, NP (last seen 06/24) Primary Cardiologist: Dorothyann Peng, MD (last seen 02/22)  HPI:  [name] is a 58 y/o female with a history of pulmonary embolus, obstructive sleep apnea, atrial fibrillation, COPD, gout, HTN, CVA, hyperlipidemia, depression, anxiety, OSA, remote tobacco use and chronic heart failure.   Admitted 01/11/22 due to productive cough with greenish colored sputum, shortness of breath, fever, chills, malaise, sore throat, body aches. Denies chest pain. Patient also reports nausea, vomiting, diarrhea and abdominal pain. GI pathogen panel negative but C. difficile antigen positive with negative toxin with reflex to PCR which came back positive. Influenza A PCR positive.   Had a pharmacologic stress trest done 09/12/12 and had an estimated EF of 49%.   Echo 06/17/16: EF of 55-65%. Echo 03/26/2018: EF of 60-65% along with mild/ moderate TR.   Echo 09/21/22: EF 55-60% with moderate LVH, Grade II DD and mild dilatation of the aortic root, measuring 44 mm. There is mild dilatation of the ascending aorta, measuring 41 mm. There is mild dilatation of the aortic arch, measuring 39 mm.   Catheterization 03/27/2018:  normal coronary arteries and normal left ventricular function.  She presents today for a follow-up visit with a chief complaint of minimal fatigue with moderate exertion. Chronic in nature. Has associated dry cough along with this. She feels like her cough has worsened since she has been out of her albuterol inhaler. She denies shortness of breath, chest pain, palpitations, abdominal distention, pedal edema, dizziness or difficulty sleeping. Overall is feeling well and says that she's been taking her medications.     ROS: All systems negative except as listed in HPI, PMH and Problem List.  SH:  Social History   Socioeconomic History   Marital status: Legally Separated    Spouse name: Not on file   Number of  children: Not on file   Years of education: Not on file   Highest education level: Not on file  Occupational History   Not on file  Tobacco Use   Smoking status: Former    Current packs/day: 0.00    Types: Cigarettes    Quit date: 01/26/2016    Years since quitting: 7.1   Smokeless tobacco: Never  Vaping Use   Vaping status: Never Used  Substance and Sexual Activity   Alcohol use: Yes    Comment: Occassionally   Drug use: No   Sexual activity: Never  Other Topics Concern   Not on file  Social History Narrative   Not on file   Social Drivers of Health   Financial Resource Strain: Not on file  Food Insecurity: No Food Insecurity (01/11/2022)   Hunger Vital Sign    Worried About Running Out of Food in the Last Year: Never true    Ran Out of Food in the Last Year: Never true  Transportation Needs: No Transportation Needs (01/11/2022)   PRAPARE - Administrator, Civil Service (Medical): No    Lack of Transportation (Non-Medical): No  Physical Activity: Not on file  Stress: Not on file  Social Connections: Not on file  Intimate Partner Violence: Not At Risk (01/11/2022)   Humiliation, Afraid, Rape, and Kick questionnaire    Fear of Current or Ex-Partner: No    Emotionally Abused: No    Physically Abused: No    Sexually Abused: No    FH:  Family History  Problem Relation Age of Onset   Prostate cancer  Father    Rectal cancer Sister    Breast cancer Sister    Cancer Maternal Grandmother    Cancer Maternal Grandfather     Past Medical History:  Diagnosis Date   A-fib [facility]) 2010   Adrenal adenoma, left 10/06/2004   a.) CT 10/06/2004 --> measured 3.1 x 1.6 cm   Alcohol abuse    Allergy    Anginal pain (HCC)    Anxiety    Cardiac murmur    CHF (congestive heart failure) (HCC)    COPD (chronic obstructive pulmonary disease) (HCC)    Diverticulosis    Fatty liver    GERD (gastroesophageal reflux disease)    Gout    History of cocaine abuse (HCC)     a.) reports that use discontinued following CVA in 2010.   History of kidney stones    Hypertension    Mild cognitive impairment    Non-healing non-surgical wound    right leg   Obesity    On supplemental oxygen therapy    a.) 2 L/George   OSA on CPAP    Pulmonary embolus (HCC) 02/19/2012   a.) CTA --> RIGHT upper lobe.   Stroke [facility]) 2010   Thoracic ascending aortic aneurysm (HCC)    a.) CT 10/28/2019 --> measured 4.6 cm.   Thyroid disease    Tricuspid valvular regurgitation    a.) TTE 03/26/2018 --> mild to moderate.    Current Outpatient Medications  Medication Sig Dispense Refill   albuterol (VENTOLIN HFA) 108 (90 Base) MCG/ACT inhaler Inhale 2 puffs into the lungs every 6 (six) hours as needed for wheezing or shortness of breath. 18 g 2   allopurinol (ZYLOPRIM) 300 MG tablet Take 1 tablet (300 mg total) by mouth daily. 90 tablet 1   amLODipine (NORVASC) 10 MG tablet TAKE 1 TABLET BY MOUTH EVERY DAY 90 tablet 0   aspirin EC 81 MG tablet Take 1 tablet (81 mg total) by mouth daily. 30 tablet 1   atorvastatin (LIPITOR) 40 MG tablet TAKE 1 TABLET BY MOUTH EVERY DAY 90 tablet 0   Cholecalciferol (VITAMIN D3) 25 MCG (1000 UT) CAPS Take 1 capsule (1,000 Units total) by mouth daily. 90 capsule 1   citalopram (CELEXA) 10 MG tablet Take 1 tablet (10 mg total) by mouth daily. 30 tablet 3   cloNIDine (CATAPRES) 0.1 MG tablet TAKE 1 TABLET BY MOUTH EVERY DAY 90 tablet 0   colchicine 0.6 MG tablet Take 1 tablet (0.6 mg total) by mouth daily. 10 tablet 2   furosemide (LASIX) 40 MG tablet Take 1 tablet (40 mg total) by mouth daily. 30 tablet 5   gabapentin (NEURONTIN) 300 MG capsule TAKE 2 CAPSULES (600 MG TOTAL) BY MOUTH AT BEDTIME. 180 capsule 0   hydrALAZINE (APRESOLINE) 100 MG tablet TAKE 1 TABLET BY MOUTH 3 TIMES DAILY. 270 tablet 0   lisinopril (ZESTRIL) 40 MG tablet Take 1 tablet (40 mg total) by mouth daily. 90 tablet 0   metoprolol tartrate (LOPRESSOR) 25 MG tablet Take 1 tablet (25 mg  total) by mouth 2 (two) times daily. 60 tablet 0   ondansetron (ZOFRAN-ODT) 4 MG disintegrating tablet Take 1 tablet (4 mg total) by mouth every 6 (six) hours as needed for nausea or vomiting. 20 tablet 0   pantoprazole (PROTONIX) 40 MG tablet TAKE 1 TABLET BY MOUTH EVERY DAY 90 tablet 3   potassium chloride SA (KLOR-CON M) 20 MEQ tablet Take 1 tablet (20 mEq total) by mouth 2 (two) times  daily. 60 tablet 5   spironolactone (ALDACTONE) 25 MG tablet Take 1 tablet (25 mg total) by mouth daily. 90 tablet 3   Tiotropium Bromide-Olodaterol 2.5-2.5 MCG/ACT AERS Inhale 2 puffs into the lungs daily. 4 g 3   No current facility-administered medications for this visit.   There were no vitals filed for this visit.   Wt Readings from Last 3 Encounters:  02/08/23 237 lb (107.5 kg)  12/31/22 231 lb 9.6 oz (105.1 kg)  11/30/22 225 lb 6.4 oz (102.2 kg)   Lab Results  Component Value Date   CREATININE 0.75 12/31/2022   CREATININE 0.83 11/30/2022   CREATININE 0.80 11/11/2022   PHYSICAL EXAM:  General:  Well appearing. No resp difficulty HEENT: normal Neck: supple. JVP flat. No lymphadenopathy or thryomegaly appreciated. Cor: PMI normal. Regular rate & rhythm. No rubs, gallops or murmurs. Lungs: clear Abdomen: soft, nontender, nondistended. No hepatosplenomegaly. No bruits or masses.  Extremities: no cyanosis, clubbing, rash, trace pitting edema bilateral lower legs Neuro: alert & orientedx3, cranial nerves grossly intact. Moves all 4 extremities w/o difficulty. Affect pleasant.   ECG: not done   ASSESSMENT & PLAN:  1: NICM with preserved ejection fraction- - likely due to HTN/ OSA as cath in 2020 was without CAD - NYHA class II - euvolemic today - not weighing daily; reminded to call for an overnight weight gain of >2 pounds or a weekly weight gain of >5 pounds.  - weight up 5 pounds from last visit here 2 months ago - Echo 06/17/16: EF of 55-65%. - Echo 03/26/2018: EF of 60-65% along with  mild/ moderate TR.   - Echo 09/21/22: EF 55-60% with moderate LVH, Grade II DD and mild dilatation of the aortic root, measuring 44 mm. There is mild dilatation of the ascending aorta, measuring 41 mm. There is mild dilatation of the aortic arch, measuring 39 mm.  - Catheterization 03/27/2018:  normal coronary arteries and normal left ventricular function. - not adding salt to her food. Reminded to closely follow a 2000mg  sodium diet  - saw cardiology Mellissa Kohut) 02/22; needs f/u appt  - continue lisinopril 40mg  daily; consider changing to entresto - continue metoprolol tartrate 25mg  BID - continue spironolactone 25mg  daily - continue furosemide 40mg  daily/ potassium daily - consider SGLT2 - BMP today - BNP 09/01/21 was 135.3  2: HTN- - BP 117/84 - saw PCP Caren Griffins) 06/24 - continue amlodipine 10mg  daily - continue clonidine 0.1mg  daily; if BP continues to look good, try to wean off clonidine - continue hydralazine 100mg  TID - continue lisinopril 40mg  daily - BMP 07/27/22 and it showed sodium 142, potassium 3.5, creatinine 0.71 and GFR >60 - BMP today  3: Obstructive sleep apnea- - reports sleeping well - does not have oxygen and hasn't seen pulmonology  4: Depression/ anxiety- - continues to struggle with depression/ anxiety but overall says that she's feeling well  5: Thoracic aneurysm- - stable per CT's - has been followed by [facility] cardiology; have reached out to them to get f/u scheduled since patient is over due - Echo 09/21/22: EF 55-60% with moderate LVH, Grade II DD and mild dilatation of the aortic root, measuring 44 mm. There is mild dilatation of the ascending aorta, measuring 41 mm. There is mild dilatation of the aortic arch, measuring 39 mm.  - chest CTA 01/11/22: Ascending thoracic aorta is mildly dilated measuring 4.3 cm  Return in 6 months, sooner if needed.       Delma Freeze,  FNP 03/30/23

## 2023-03-31 ENCOUNTER — Ambulatory Visit: Payer: Medicaid Other | Admitting: Nurse Practitioner

## 2023-03-31 ENCOUNTER — Encounter: Payer: Medicaid Other | Admitting: Family

## 2023-03-31 NOTE — Progress Notes (Deleted)
 There were no vitals taken for this visit.   Subjective:    Patient ID: [name], female    DOB: 09-Jan-1966, 58 y.o.   MRN: [id number]  HPI: [name] is a 58 y.o. female  No chief complaint on file.  OXYGEN Patient states she has not made an appt with Pulmonology.  Not on portable oxygen.  Breathing well in office today.    HYPERTENSION / HYPERLIPIDEMIA Patient states she does not weigh daily.   Has not seen cardiology since last visit.  Patient is having to sleep with 5 pillows.  ECHO in August 2024-EF of 55-60%.  Satisfied with current treatment? yes Duration of hypertension: years BP monitoring frequency: not checking BP range: 138/80 BP medication side effects: no Past BP meds:  hydralazine clonidine, amlodipine, and lisinopril Duration of hyperlipidemia: years Cholesterol medication side effects: no Cholesterol supplements: none Past cholesterol medications: atorvastain (lipitor),  Medication compliance: excellent compliance Aspirin: yes Recent stressors: no Recurrent headaches: no Visual changes: no Palpitations: no Dyspnea: yes Chest pain: no Lower extremity edema: no Dizzy/lightheaded: no  COPD Not using Spiriva consistently.  COPD status: improved Satisfied with current treatment?: no Oxygen use: no - would like oxygen. She feels like she would benefit from oxygen to help her increase her ability to walk further Dyspnea frequency: daily Cough frequency: occasionally Rescue inhaler frequency:  a couple times a week Limitation of activity: yes Productive cough:  Last Spirometry:  Pneumovax: Up to Date Influenza: Not up to Date  ANXIETY Patient states her anxiety has been better.  She feels like she gets bored and doesn't have a lot of visitors.  Flowsheet Row Office Visit from 02/08/2023 in [facility]  PHQ-9 Total Score 14         02/08/2023    8:27 AM 12/31/2022   11:25 AM 11/30/2022   10:54 AM 11/10/2022     9:56 AM  GAD 7 : Generalized Anxiety Score  Nervous, Anxious, on Edge 1 3 2 2   Control/stop worrying 2 3 2 2   Worry too much - different things 2 3 3 3   Trouble relaxing 1 3 2 2   Restless 1 2 2 1   Easily annoyed or irritable 1 1 2 1   Afraid - awful might happen 1 2  2   Total GAD 7 Score 9 17  13   Anxiety Difficulty Somewhat difficult   Somewhat difficult    Relevant past medical, surgical, family and social history reviewed and updated as indicated. Interim medical history since our last visit reviewed. Allergies and medications reviewed and updated.  Review of Systems  Eyes:  Negative for visual disturbance.  Respiratory:  Positive for shortness of breath. Negative for cough and chest tightness.   Cardiovascular:  Negative for chest pain, palpitations and leg swelling.  Neurological:  Negative for dizziness and headaches.  Psychiatric/Behavioral:  Positive for dysphoric mood. Negative for suicidal ideas. The patient is nervous/anxious.     Per HPI unless specifically indicated above     Objective:    There were no vitals taken for this visit.  Wt Readings from Last 3 Encounters:  02/08/23 237 lb (107.5 kg)  12/31/22 231 lb 9.6 oz (105.1 kg)  11/30/22 225 lb 6.4 oz (102.2 kg)    Physical Exam Vitals and nursing note reviewed.  Constitutional:      General: She is not in acute distress.    Appearance: Normal appearance. She is normal weight. She is not ill-appearing, toxic-appearing  or diaphoretic.  HENT:     Head: Normocephalic.     Right Ear: External ear normal.     Left Ear: External ear normal.     Nose: Nose normal.     Mouth/Throat:     Mouth: Mucous membranes are moist.     Pharynx: Oropharynx is clear.  Eyes:     General:        Right eye: No discharge.        Left eye: No discharge.     Extraocular Movements: Extraocular movements intact.     Conjunctiva/sclera: Conjunctivae normal.     Pupils: Pupils are equal, round, and reactive to light.   Cardiovascular:     Rate and Rhythm: Normal rate. Rhythm irregular.     Heart sounds: No murmur heard. Pulmonary:     Effort: Pulmonary effort is normal. No respiratory distress.     Breath sounds: Normal breath sounds. No wheezing or rales.  Musculoskeletal:     Cervical back: Normal range of motion and neck supple.     Right lower leg: No edema.     Left lower leg: No edema.  Skin:    General: Skin is warm and dry.     Capillary Refill: Capillary refill takes less than 2 seconds.  Neurological:     General: No focal deficit present.     Mental Status: She is alert and oriented to person, place, and time. Mental status is at baseline.  Psychiatric:        Mood and Affect: Mood normal.        Behavior: Behavior normal.        Thought Content: Thought content normal.        Judgment: Judgment normal.     Results for orders placed or performed in visit on 12/31/22  Comp Met (CMET)   Collection Time: 12/31/22 11:38 AM  Result Value Ref Range   Glucose 89 70 - 99 mg/dL   BUN 9 6 - 24 mg/dL   Creatinine, Ser 7.82 0.57 - 1.00 mg/dL   eGFR 93 >95 AO/ZHY/8.65   BUN/Creatinine Ratio 12 9 - 23   Sodium 142 134 - 144 mmol/L   Potassium 3.3 (L) 3.5 - 5.2 mmol/L   Chloride 103 96 - 106 mmol/L   CO2 24 20 - 29 mmol/L   Calcium 9.2 8.7 - 10.2 mg/dL   Total Protein 6.9 6.0 - 8.5 g/dL   Albumin 4.1 3.8 - 4.9 g/dL   Globulin, Total 2.8 1.5 - 4.5 g/dL   Bilirubin Total 1.0 0.0 - 1.2 mg/dL   Alkaline Phosphatase 93 44 - 121 IU/L   AST 13 0 - 40 IU/L   ALT 8 0 - 32 IU/L  Lipid Profile   Collection Time: 12/31/22 11:38 AM  Result Value Ref Range   Cholesterol, Total 226 (H) 100 - 199 mg/dL   Triglycerides 784 0 - 149 mg/dL   HDL 62 >69 mg/dL   VLDL Cholesterol Cal 19 5 - 40 mg/dL   LDL Chol Calc (NIH) 629 (H) 0 - 99 mg/dL   Chol/HDL Ratio 3.6 0.0 - 4.4 ratio  HgB A1c   Collection Time: 12/31/22 11:38 AM  Result Value Ref Range   Hgb A1c MFr Bld 6.4 (H) 4.8 - 5.6 %   Est. average  glucose Bld gHb Est-mCnc 137 mg/dL      Assessment & Plan:   Problem List Items Addressed This Visit       Cardiovascular  and Mediastinum   Chronic diastolic heart failure (HCC) - Primary (Chronic)   Thoracic ascending aortic aneurysm (HCC)   Atrial fibrillation, chronic (HCC)     Respiratory   COPD (chronic obstructive pulmonary disease) (HCC)   OSA on CPAP     Endocrine   IFG (impaired fasting glucose)     Other   Hyperlipidemia   Depression with anxiety   Morbid obesity (HCC)     Follow up plan: No follow-ups on file.

## 2023-04-04 ENCOUNTER — Telehealth: Payer: Self-pay | Admitting: Family

## 2023-04-04 NOTE — Telephone Encounter (Signed)
 Pt confirmed appt on 04/05/23

## 2023-04-05 ENCOUNTER — Encounter: Admitting: Family

## 2023-04-05 NOTE — Telephone Encounter (Signed)
 Patient did not show for her Heart Failure Clinic appointment on 04/05/23.

## 2023-04-27 ENCOUNTER — Telehealth: Payer: Self-pay | Admitting: Family

## 2023-04-27 NOTE — Telephone Encounter (Signed)
 Called to r/s no show appt on 04/05/23

## 2023-05-06 ENCOUNTER — Other Ambulatory Visit: Payer: Self-pay | Admitting: Pediatrics

## 2023-05-06 DIAGNOSIS — F419 Anxiety disorder, unspecified: Secondary | ICD-10-CM

## 2023-05-09 ENCOUNTER — Telehealth: Payer: Self-pay | Admitting: Family

## 2023-05-09 NOTE — Progress Notes (Deleted)
 Advanced Heart Failure Clinic Note   PCP: Aileen Alexanders, NP (last seen 06/24) Primary Cardiologist: Burney Carter, MD (last seen 02/22)  HPI:  [name] is a 58 y/o female with a history of pulmonary embolus, obstructive sleep apnea, atrial fibrillation, COPD, gout, HTN, CVA, hyperlipidemia, depression, anxiety, OSA, remote tobacco use and chronic heart failure.   Admitted 01/11/22 due to productive cough with greenish colored sputum, shortness of breath, fever, chills, malaise, sore throat, body aches. Denies chest pain. Patient also reports nausea, vomiting, diarrhea and abdominal pain. GI pathogen panel negative but C. difficile antigen positive with negative toxin with reflex to PCR which came back positive. Influenza A PCR positive.   Had a pharmacologic stress trest done 09/12/12 and had an estimated EF of 49%.   Echo 06/17/16: EF of 55-65%. Echo 03/26/2018: EF of 60-65% along with mild/ moderate TR.   Echo 09/21/22: EF 55-60% with moderate LVH, Grade II DD and mild dilatation of the aortic root, measuring 44 mm. There is mild dilatation of the ascending aorta, measuring 41 mm. There is mild dilatation of the aortic arch, measuring 39 mm.   Catheterization 03/27/2018:  normal coronary arteries and normal left ventricular function.  She presents today for a follow-up visit with a chief complaint of minimal fatigue with moderate exertion. Chronic in nature. Has associated dry cough along with this. She feels like her cough has worsened since she has been out of her albuterol inhaler. She denies shortness of breath, chest pain, palpitations, abdominal distention, pedal edema, dizziness or difficulty sleeping. Overall is feeling well and says that she's been taking her medications.     ROS: All systems negative except as listed in HPI, PMH and Problem List.  SH:  Social History   Socioeconomic History   Marital status: Legally Separated    Spouse name: Not on file   Number of  children: Not on file   Years of education: Not on file   Highest education level: Not on file  Occupational History   Not on file  Tobacco Use   Smoking status: Former    Current packs/day: 0.00    Types: Cigarettes    Quit date: 01/26/2016    Years since quitting: 7.2   Smokeless tobacco: Never  Vaping Use   Vaping status: Never Used  Substance and Sexual Activity   Alcohol use: Yes    Comment: Occassionally   Drug use: No   Sexual activity: Never  Other Topics Concern   Not on file  Social History Narrative   Not on file   Social Drivers of Health   Financial Resource Strain: Not on file  Food Insecurity: No Food Insecurity (01/11/2022)   Hunger Vital Sign    Worried About Running Out of Food in the Last Year: Never true    Ran Out of Food in the Last Year: Never true  Transportation Needs: No Transportation Needs (01/11/2022)   PRAPARE - Administrator, Civil Service (Medical): No    Lack of Transportation (Non-Medical): No  Physical Activity: Not on file  Stress: Not on file  Social Connections: Not on file  Intimate Partner Violence: Not At Risk (01/11/2022)   Humiliation, Afraid, Rape, and Kick questionnaire    Fear of Current or Ex-Partner: No    Emotionally Abused: No    Physically Abused: No    Sexually Abused: No    FH:  Family History  Problem Relation Age of Onset   Prostate cancer Father  Rectal cancer Sister    Breast cancer Sister    Cancer Maternal Grandmother    Cancer Maternal Grandfather     Past Medical History:  Diagnosis Date   A-fib [facility]) 2010   Adrenal adenoma, left 10/06/2004   a.) CT 10/06/2004 --> measured 3.1 x 1.6 cm   Alcohol abuse    Allergy    Anginal pain (HCC)    Anxiety    Cardiac murmur    CHF (congestive heart failure) (HCC)    COPD (chronic obstructive pulmonary disease) (HCC)    Diverticulosis    Fatty liver    GERD (gastroesophageal reflux disease)    Gout    History of cocaine abuse (HCC)     a.) reports that use discontinued following CVA in 2010.   History of kidney stones    Hypertension    Mild cognitive impairment    Non-healing non-surgical wound    right leg   Obesity    On supplemental oxygen therapy    a.) 2 L/[place]   OSA on CPAP    Pulmonary embolus (HCC) 02/19/2012   a.) CTA --> RIGHT upper lobe.   Stroke [facility]) 2010   Thoracic ascending aortic aneurysm (HCC)    a.) CT 10/28/2019 --> measured 4.6 cm.   Thyroid disease    Tricuspid valvular regurgitation    a.) TTE 03/26/2018 --> mild to moderate.    Current Outpatient Medications  Medication Sig Dispense Refill   albuterol (VENTOLIN HFA) 108 (90 Base) MCG/ACT inhaler Inhale 2 puffs into the lungs every 6 (six) hours as needed for wheezing or shortness of breath. 18 g 2   allopurinol (ZYLOPRIM) 300 MG tablet Take 1 tablet (300 mg total) by mouth daily. 90 tablet 1   amLODipine (NORVASC) 10 MG tablet TAKE 1 TABLET BY MOUTH EVERY DAY 90 tablet 0   aspirin EC 81 MG tablet Take 1 tablet (81 mg total) by mouth daily. 30 tablet 1   atorvastatin (LIPITOR) 40 MG tablet TAKE 1 TABLET BY MOUTH EVERY DAY 90 tablet 0   Cholecalciferol (VITAMIN D3) 25 MCG (1000 UT) CAPS Take 1 capsule (1,000 Units total) by mouth daily. 90 capsule 1   citalopram (CELEXA) 10 MG tablet TAKE 1 TABLET BY MOUTH EVERY DAY 90 tablet 0   cloNIDine (CATAPRES) 0.1 MG tablet TAKE 1 TABLET BY MOUTH EVERY DAY 90 tablet 0   colchicine 0.6 MG tablet Take 1 tablet (0.6 mg total) by mouth daily. 10 tablet 2   furosemide (LASIX) 40 MG tablet Take 1 tablet (40 mg total) by mouth daily. 30 tablet 5   gabapentin (NEURONTIN) 300 MG capsule TAKE 2 CAPSULES (600 MG TOTAL) BY MOUTH AT BEDTIME. 180 capsule 0   hydrALAZINE (APRESOLINE) 100 MG tablet TAKE 1 TABLET BY MOUTH 3 TIMES DAILY. 270 tablet 0   lisinopril (ZESTRIL) 40 MG tablet Take 1 tablet (40 mg total) by mouth daily. 90 tablet 0   metoprolol tartrate (LOPRESSOR) 25 MG tablet Take 1 tablet (25 mg total) by  mouth 2 (two) times daily. 60 tablet 0   ondansetron (ZOFRAN-ODT) 4 MG disintegrating tablet Take 1 tablet (4 mg total) by mouth every 6 (six) hours as needed for nausea or vomiting. 20 tablet 0   pantoprazole (PROTONIX) 40 MG tablet TAKE 1 TABLET BY MOUTH EVERY DAY 90 tablet 3   potassium chloride SA (KLOR-CON M) 20 MEQ tablet Take 1 tablet (20 mEq total) by mouth 2 (two) times daily. 60 tablet 5  spironolactone (ALDACTONE) 25 MG tablet Take 1 tablet (25 mg total) by mouth daily. 90 tablet 3   Tiotropium Bromide-Olodaterol 2.5-2.5 MCG/ACT AERS Inhale 2 puffs into the lungs daily. 4 g 3   No current facility-administered medications for this visit.   There were no vitals filed for this visit.   Wt Readings from Last 3 Encounters:  02/08/23 237 lb (107.5 kg)  12/31/22 231 lb 9.6 oz (105.1 kg)  11/30/22 225 lb 6.4 oz (102.2 kg)   Lab Results  Component Value Date   CREATININE 0.75 12/31/2022   CREATININE 0.83 11/30/2022   CREATININE 0.80 11/11/2022   PHYSICAL EXAM:  General:  Well appearing. No resp difficulty HEENT: normal Neck: supple. JVP flat. No lymphadenopathy or thryomegaly appreciated. Cor: PMI normal. Regular rate & rhythm. No rubs, gallops or murmurs. Lungs: clear Abdomen: soft, nontender, nondistended. No hepatosplenomegaly. No bruits or masses.  Extremities: no cyanosis, clubbing, rash, trace pitting edema bilateral lower legs Neuro: alert & orientedx3, cranial nerves grossly intact. Moves all 4 extremities w/o difficulty. Affect pleasant.   ECG: not done   ASSESSMENT & PLAN:  1: NICM with preserved ejection fraction- - likely due to HTN/ OSA as cath in 2020 was without CAD - NYHA class II - euvolemic today - not weighing daily; reminded to call for an overnight weight gain of >2 pounds or a weekly weight gain of >5 pounds.  - weight up 5 pounds from last visit here 2 months ago - Echo 06/17/16: EF of 55-65%. - Echo 03/26/2018: EF of 60-65% along with mild/  moderate TR.   - Echo 09/21/22: EF 55-60% with moderate LVH, Grade II DD and mild dilatation of the aortic root, measuring 44 mm. There is mild dilatation of the ascending aorta, measuring 41 mm. There is mild dilatation of the aortic arch, measuring 39 mm.  - Catheterization 03/27/2018:  normal coronary arteries and normal left ventricular function. - not adding salt to her food. Reminded to closely follow a 2000mg  sodium diet  - saw cardiology Mellissa Kohut) 02/22; needs f/u appt  - continue lisinopril 40mg  daily; consider changing to entresto - continue metoprolol tartrate 25mg  BID - continue spironolactone 25mg  daily - continue furosemide 40mg  daily/ potassium daily - consider SGLT2 - BMP today - BNP 09/01/21 was 135.3  2: HTN- - BP 117/84 - saw PCP [name]) 06/24 - continue amlodipine 10mg  daily - continue clonidine 0.1mg  daily; if BP continues to look good, try to wean off clonidine - continue hydralazine 100mg  TID - continue lisinopril 40mg  daily - BMP 07/27/22 and it showed sodium 142, potassium 3.5, creatinine 0.71 and GFR >60 - BMP today  3: Obstructive sleep apnea- - reports sleeping well - does not have oxygen and hasn't seen pulmonology  4: Depression/ anxiety- - continues to struggle with depression/ anxiety but overall says that she's feeling well  5: Thoracic aneurysm- - stable per CT's - has been followed by [facility] cardiology; have reached out to them to get f/u scheduled since patient is over due - Echo 09/21/22: EF 55-60% with moderate LVH, Grade II DD and mild dilatation of the aortic root, measuring 44 mm. There is mild dilatation of the ascending aorta, measuring 41 mm. There is mild dilatation of the aortic arch, measuring 39 mm.  - chest CTA 01/11/22: Ascending thoracic aorta is mildly dilated measuring 4.3 cm  Return in 6 months, sooner if needed.       Delma Freeze, FNP 05/09/23

## 2023-05-09 NOTE — Telephone Encounter (Incomplete)
 Called to confirm/remind patient of their appointment at the Advanced Heart Failure Clinic on 05/10/23***.   Appointment:   [] Confirmed  [] Left mess   [x] No answer/No voice mail  [] Phone not in service  Patient reminded to bring all medications and/or complete list.  Confirmed patient has transportation. Gave directions, instructed to utilize valet parking.

## 2023-05-09 NOTE — Telephone Encounter (Signed)
 OV needed for additional refills.  Requested Prescriptions  Pending Prescriptions Disp Refills   citalopram (CELEXA) 10 MG tablet [Pharmacy Med Name: CITALOPRAM HBR 10 MG TABLET] 90 tablet 0    Sig: TAKE 1 TABLET BY MOUTH EVERY DAY     Psychiatry:  Antidepressants - SSRI Failed - 05/09/2023  8:57 AM      Failed - Valid encounter within last 6 months    Recent Outpatient Visits   None            Passed - Completed PHQ-2 or PHQ-9 in the last 360 days

## 2023-05-10 ENCOUNTER — Telehealth: Payer: Self-pay | Admitting: Family

## 2023-05-10 ENCOUNTER — Encounter: Admitting: Family

## 2023-05-10 ENCOUNTER — Other Ambulatory Visit (HOSPITAL_COMMUNITY): Payer: Self-pay

## 2023-05-10 NOTE — Telephone Encounter (Signed)
 Patient did not show for her Heart Failure Clinic appointment on 05/10/23.

## 2023-05-19 ENCOUNTER — Emergency Department

## 2023-05-19 ENCOUNTER — Other Ambulatory Visit: Payer: Self-pay

## 2023-05-19 ENCOUNTER — Emergency Department
Admission: EM | Admit: 2023-05-19 | Discharge: 2023-05-19 | Disposition: A | Discharge status: Home / Self Care | Attending: Emergency Medicine | Admitting: Emergency Medicine

## 2023-05-19 DIAGNOSIS — I10 Essential (primary) hypertension: Secondary | ICD-10-CM | POA: Diagnosis not present

## 2023-05-19 DIAGNOSIS — R072 Precordial pain: Secondary | ICD-10-CM | POA: Insufficient documentation

## 2023-05-19 DIAGNOSIS — R079 Chest pain, unspecified: Secondary | ICD-10-CM

## 2023-05-19 LAB — CBC WITH DIFFERENTIAL/PLATELET
Abs Immature Granulocytes: 0.05 10*3/uL (ref 0.00–0.07)
Basophils Absolute: 0 10*3/uL (ref 0.0–0.1)
Basophils Relative: 0 %
Eosinophils Absolute: 0.1 10*3/uL (ref 0.0–0.5)
Eosinophils Relative: 1 %
HCT: 41.4 % (ref 36.0–46.0)
Hemoglobin: 13.7 g/dL (ref 12.0–15.0)
Immature Granulocytes: 1 %
Lymphocytes Relative: 22 %
Lymphs Abs: 2 10*3/uL (ref 0.7–4.0)
MCH: 32 pg (ref 26.0–34.0)
MCHC: 33.1 g/dL (ref 30.0–36.0)
MCV: 96.7 fL (ref 80.0–100.0)
Monocytes Absolute: 0.7 10*3/uL (ref 0.1–1.0)
Monocytes Relative: 8 %
Neutro Abs: 6 10*3/uL (ref 1.7–7.7)
Neutrophils Relative %: 68 %
Platelets: 247 10*3/uL (ref 150–400)
RBC: 4.28 MIL/uL (ref 3.87–5.11)
RDW: 13.9 % (ref 11.5–15.5)
WBC: 8.8 10*3/uL (ref 4.0–10.5)
nRBC: 0 % (ref 0.0–0.2)

## 2023-05-19 LAB — COMPREHENSIVE METABOLIC PANEL WITH GFR
ALT: 10 U/L (ref 0–44)
AST: 21 U/L (ref 15–41)
Albumin: 3.3 g/dL — ABNORMAL LOW (ref 3.5–5.0)
Alkaline Phosphatase: 47 U/L (ref 38–126)
Anion gap: 10 (ref 5–15)
BUN: 13 mg/dL (ref 6–20)
CO2: 24 mmol/L (ref 22–32)
Calcium: 8.5 mg/dL — ABNORMAL LOW (ref 8.9–10.3)
Chloride: 106 mmol/L (ref 98–111)
Creatinine, Ser: 0.73 mg/dL (ref 0.44–1.00)
GFR, Estimated: >60 mL/min (ref >60)
Glucose, Bld: 94 mg/dL (ref 70–99)
Potassium: 3.4 mmol/L — ABNORMAL LOW (ref 3.5–5.1)
Sodium: 140 mmol/L (ref 135–145)
Total Bilirubin: 1.1 mg/dL (ref 0.0–1.2)
Total Protein: 6.5 g/dL (ref 6.5–8.1)

## 2023-05-19 LAB — TROPONIN I (HIGH SENSITIVITY)
Troponin I (High Sensitivity): 8 ng/L (ref <18)
Troponin I (High Sensitivity): 12 ng/L (ref <18)

## 2023-05-19 LAB — LIPASE, BLOOD: Lipase: 26 U/L (ref 11–51)

## 2023-05-19 LAB — BRAIN NATRIURETIC PEPTIDE: B Natriuretic Peptide: 44.4 pg/mL (ref 0.0–100.0)

## 2023-05-19 NOTE — ED Provider Notes (Signed)
 [facility] Provider Note   Event Date/Time   First MD Initiated Contact with Patient 05/19/23 [phone]     (approximate) History  Chest Pain  HPI [name] is a 58 y.o. female with a past medical history of heart failure, paroxysmal atrial fibrillation not on anticoagulation, hypertension, and hyperlipidemia who presents after an episode of vomiting this morning after taking her medications as well and subsequent chest pain after this event.  EMS found patient to be in atrial fibrillation with rapid ventricular response but were unable to get diltiazem prior to arrival.  Patient now complains of burning/heavy substernal chest pain that does not radiate and has no exacerbating or relieving factors.  Patient states she took a sublingual Zofran  prior to arrival and denies any nausea at this time ROS: Patient currently denies any vision changes, tinnitus, difficulty speaking, facial droop, sore throat, shortness of breath, abdominal pain, nausea/vomiting/diarrhea, dysuria, or weakness/numbness/paresthesias in any extremity   Physical Exam  Triage Vital Signs: ED Triage Vitals  Encounter Vitals Group     BP 05/19/23 0837 (!) 133/96     Systolic BP Percentile --      Diastolic BP Percentile --      Pulse Rate 05/19/23 0837 (!) 107     Resp 05/19/23 0836 20     Temp 05/19/23 0837 98.1 F (36.7 C)     Temp Source 05/19/23 0837 Oral     SpO2 05/19/23 0836 100 %     Weight --      Height --      Head Circumference --      Peak Flow --      Pain Score 05/19/23 0835 7     Pain Loc --      Pain Education --      Exclude from Growth Chart --    Most recent vital signs: Vitals:   05/19/23 1030 05/19/23 1315  BP: (!) 120/96 (!) 148/108  Pulse: (!) 143 83  Resp: (!) 26 (!) 34  Temp:    SpO2: 98% 97%   General: Awake, oriented x4. CV:  Good peripheral perfusion.  Resp:  Normal effort.  CTAB Abd:  No distention.  Other:  Middle-aged obese African-American  female resting comfortably in no acute distress ED Results / Procedures / Treatments  Labs (all labs ordered are listed, but only abnormal results are displayed) Labs Reviewed  COMPREHENSIVE METABOLIC PANEL WITH GFR - Abnormal; Notable for the following components:      Result Value   Potassium 3.4 (*)    Calcium  8.5 (*)    Albumin 3.3 (*)    All other components within normal limits  BRAIN NATRIURETIC PEPTIDE  CBC WITH DIFFERENTIAL/PLATELET  LIPASE, BLOOD  TROPONIN I (HIGH SENSITIVITY)  TROPONIN I (HIGH SENSITIVITY)   EKG ED ECG REPORT I, Charleen Conn, the attending physician, personally viewed and interpreted this ECG. Date: 05/19/2023 EKG Time: 0842 Rate: 96 Rhythm: normal sinus rhythm QRS Axis: normal Intervals: normal ST/T Wave abnormalities: normal Narrative Interpretation: no evidence of acute ischemia RADIOLOGY ED MD interpretation: One-view portable chest x-ray interpreted by me shows no evidence of acute abnormalities including no pneumonia, pneumothorax, or widened mediastinum -Agree with radiology assessment Official radiology report(s): DG Chest Port 1 View Result Date: 05/19/2023 CLINICAL DATA:  Chest pain EXAM: PORTABLE CHEST 1 VIEW COMPARISON:  11/11/2022 FINDINGS: Cardiomegaly. Mediastinal contours within normal limits. No confluent airspace opacities, effusions or edema. No acute bony abnormality. Multiple old healed  bilateral rib fractures. IMPRESSION: Cardiomegaly.  No active disease. Electronically Signed   By: Janeece Mechanic M.D.   On: 05/19/2023 09:01   PROCEDURES: Critical Care performed: No Procedures MEDICATIONS ORDERED IN ED: Medications - No data to display IMPRESSION / MDM / ASSESSMENT AND PLAN / ED COURSE  I reviewed the triage vital signs and the nursing notes.                             The patient is on the cardiac monitor to evaluate for evidence of arrhythmia and/or significant heart rate changes. Patient's presentation is most  consistent with acute presentation with potential threat to life or bodily function. 58 year old female with the above-stated past medical history presents for shortness of breath and chest pain Workup: ECG, CXR, CBC, BMP, Troponin Findings: ECG: No overt evidence of STEMI. No evidence of Brugada's sign, delta wave, epsilon wave, significantly prolonged QTc, or malignant arrhythmia HS Troponin: Negative x1 Other Labs unremarkable for emergent problems. CXR: Without PTX, PNA, or widened mediastinum HEART Score: 4  Given History, Exam, and Workup I have low suspicion for ACS, Pneumothorax, Pneumonia, Pulmonary Embolus, Tamponade, Aortic Dissection or other emergent problem as a cause for this presentation.   Reassesment: Prior to discharge patient's pain was controlled and they were well appearing.  Disposition:  Discharge. Strict return precautions discussed with patient with full understanding. Advised patient to follow up promptly with primary care provider    FINAL CLINICAL IMPRESSION(S) / ED DIAGNOSES   Final diagnoses:  Nonspecific chest pain   Rx / DC Orders   ED Discharge Orders     None      Note:  This document was prepared using Dragon voice recognition software and may include unintentional dictation errors.   Darius Fillingim K, MD 05/19/23 [phone]

## 2023-05-19 NOTE — ED Triage Notes (Signed)
 EMS states patient called out for "breathing problems and mid chest pain"; history of A-fib.   BP 160/100 HR 100-140's O2 98% RA ETCO2 35 RR 22 BGL 134  20G RFA

## 2023-06-13 ENCOUNTER — Telehealth: Payer: Self-pay | Admitting: Family

## 2023-06-13 NOTE — Progress Notes (Deleted)
 Advanced Heart Failure Clinic Note    PCP: Betty Alexanders, [name] (last seen 06/24) Primary Cardiologist: Betty Carter, [name] (last seen 02/22)  HPI:  [name] is a 58 y/o female with a history of pulmonary embolus, obstructive sleep apnea, atrial fibrillation, COPD, gout, HTN, CVA, hyperlipidemia, depression, anxiety, OSA, remote tobacco use and chronic heart failure.   Admitted 01/11/22 due to productive cough with greenish colored sputum, shortness of breath, fever, chills, malaise, sore throat, body aches. Denies chest pain. Patient also reports nausea, vomiting, diarrhea and abdominal pain. GI pathogen panel negative but C. difficile antigen positive with negative toxin with reflex to PCR which came back positive. Influenza A PCR positive.   Had a pharmacologic stress trest done 09/12/12 and had an estimated EF of 49%.   Echo 06/17/16: EF of 55-65%. Echo 03/26/2018: EF of 60-65% along with mild/ moderate TR.   Echo 09/21/22: EF 55-60% with moderate LVH, Grade II DD and mild dilatation of the aortic root, measuring 44 mm. There is mild dilatation of the ascending aorta, measuring 41 mm. There is mild dilatation of the aortic arch, measuring 39 mm.   Catheterization 03/27/2018:  normal coronary arteries and normal left ventricular function.  She presents today for a follow-up visit with a chief complaint of minimal fatigue with moderate exertion. Chronic in nature. Has associated dry cough along with this. She feels like her cough has worsened since she has been out of her albuterol  inhaler. She denies shortness of breath, chest pain, palpitations, abdominal distention, pedal edema, dizziness or difficulty sleeping. Overall is feeling well and says that she's been taking her medications.     ROS: All systems negative except as listed in HPI, PMH and Problem List.  SH:  Social History   Socioeconomic History   Marital status: Legally Separated    Spouse name: Not on file   Number of  children: Not on file   Years of education: Not on file   Highest education level: Not on file  Occupational History   Not on file  Tobacco Use   Smoking status: Former    Current packs/day: 0.00    Types: Cigarettes    Quit date: 01/26/2016    Years since quitting: 7.3   Smokeless tobacco: Never  Vaping Use   Vaping status: Never Used  Substance and Sexual Activity   Alcohol use: Yes    Comment: Occassionally   Drug use: No   Sexual activity: Never  Other Topics Concern   Not on file  Social History Narrative   Not on file   Social Drivers of Health   Financial Resource Strain: Not on file  Food Insecurity: No Food Insecurity (01/11/2022)   Hunger Vital Sign    Worried About Running Out of Food in the Last Year: Never true    Ran Out of Food in the Last Year: Never true  Transportation Needs: No Transportation Needs (01/11/2022)   PRAPARE - Administrator, Civil Service (Medical): No    Lack of Transportation (Non-Medical): No  Physical Activity: Not on file  Stress: Not on file  Social Connections: Not on file  Intimate Partner Violence: Not At Risk (01/11/2022)   Humiliation, Afraid, Rape, and Kick questionnaire    Fear of Current or Ex-Partner: No    Emotionally Abused: No    Physically Abused: No    Sexually Abused: No    FH:  Family History  Problem Relation Age of Onset   Prostate cancer  Father    Rectal cancer Sister    Breast cancer Sister    Cancer Maternal Grandmother    Cancer Maternal Grandfather     Past Medical History:  Diagnosis Date   A-fib [facility]) 2010   Adrenal adenoma, left 10/06/2004   a.) CT 10/06/2004 --> measured 3.1 x 1.6 cm   Alcohol abuse    Allergy    Anginal pain (HCC)    Anxiety    Cardiac murmur    CHF (congestive heart failure) (HCC)    COPD (chronic obstructive pulmonary disease) (HCC)    Diverticulosis    Fatty liver    GERD (gastroesophageal reflux disease)    Gout    History of cocaine abuse (HCC)     a.) reports that use discontinued following CVA in 2010.   History of kidney stones    Hypertension    Mild cognitive impairment    Non-healing non-surgical wound    right leg   Obesity    On supplemental oxygen therapy    a.) 2 L/[place]   OSA on CPAP    Pulmonary embolus (HCC) 02/19/2012   a.) CTA --> RIGHT upper lobe.   Stroke [facility]) 2010   Thoracic ascending aortic aneurysm (HCC)    a.) CT 10/28/2019 --> measured 4.6 cm.   Thyroid disease    Tricuspid valvular regurgitation    a.) TTE 03/26/2018 --> mild to moderate.    Current Outpatient Medications  Medication Sig Dispense Refill   albuterol  (VENTOLIN  HFA) 108 (90 Base) MCG/ACT inhaler Inhale 2 puffs into the lungs every 6 (six) hours as needed for wheezing or shortness of breath. 18 g 2   allopurinol  (ZYLOPRIM ) 300 MG tablet Take 1 tablet (300 mg total) by mouth daily. 90 tablet 1   amLODipine  (NORVASC ) 10 MG tablet TAKE 1 TABLET BY MOUTH EVERY DAY 90 tablet 0   aspirin  EC 81 MG tablet Take 1 tablet (81 mg total) by mouth daily. 30 tablet 1   atorvastatin  (LIPITOR) 40 MG tablet TAKE 1 TABLET BY MOUTH EVERY DAY 90 tablet 0   Cholecalciferol  (VITAMIN D3) 25 MCG (1000 UT) CAPS Take 1 capsule (1,000 Units total) by mouth daily. 90 capsule 1   citalopram  (CELEXA ) 10 MG tablet TAKE 1 TABLET BY MOUTH EVERY DAY 90 tablet 0   cloNIDine  (CATAPRES ) 0.1 MG tablet TAKE 1 TABLET BY MOUTH EVERY DAY 90 tablet 0   colchicine  0.6 MG tablet Take 1 tablet (0.6 mg total) by mouth daily. 10 tablet 2   furosemide  (LASIX ) 40 MG tablet Take 1 tablet (40 mg total) by mouth daily. 30 tablet 5   gabapentin  (NEURONTIN ) 300 MG capsule TAKE 2 CAPSULES (600 MG TOTAL) BY MOUTH AT BEDTIME. 180 capsule 0   hydrALAZINE  (APRESOLINE ) 100 MG tablet TAKE 1 TABLET BY MOUTH 3 TIMES DAILY. 270 tablet 0   lisinopril  (ZESTRIL ) 40 MG tablet Take 1 tablet (40 mg total) by mouth daily. 90 tablet 0   metoprolol  tartrate (LOPRESSOR ) 25 MG tablet Take 1 tablet (25 mg total) by  mouth 2 (two) times daily. 60 tablet 0   ondansetron  (ZOFRAN -ODT) 4 MG disintegrating tablet Take 1 tablet (4 mg total) by mouth every 6 (six) hours as needed for nausea or vomiting. 20 tablet 0   pantoprazole  (PROTONIX ) 40 MG tablet TAKE 1 TABLET BY MOUTH EVERY DAY 90 tablet 3   potassium chloride  SA (KLOR-CON  M) 20 MEQ tablet Take 1 tablet (20 mEq total) by mouth 2 (two) times daily. 60  tablet 5   spironolactone  (ALDACTONE ) 25 MG tablet Take 1 tablet (25 mg total) by mouth daily. 90 tablet 3   Tiotropium Bromide -Olodaterol 2.5-2.5 MCG/ACT AERS Inhale 2 puffs into the lungs daily. 4 g 3   No current facility-administered medications for this visit.   There were no vitals filed for this visit.   Wt Readings from Last 3 Encounters:  02/08/23 237 lb (107.5 kg)  12/31/22 231 lb 9.6 oz (105.1 kg)  11/30/22 225 lb 6.4 oz (102.2 kg)   Lab Results  Component Value Date   CREATININE 0.73 05/19/2023   CREATININE 0.75 12/31/2022   CREATININE 0.83 11/30/2022   PHYSICAL EXAM:  General:  Well appearing. No resp difficulty HEENT: normal Neck: supple. JVP flat. No lymphadenopathy or thryomegaly appreciated. Cor: PMI normal. Regular rate & rhythm. No rubs, gallops or murmurs. Lungs: clear Abdomen: soft, nontender, nondistended. No hepatosplenomegaly. No bruits or masses.  Extremities: no cyanosis, clubbing, rash, trace pitting edema bilateral lower legs Neuro: alert & orientedx3, cranial nerves grossly intact. Moves all 4 extremities w/o difficulty. Affect pleasant.   ECG: not done   ASSESSMENT & PLAN:  1: NICM with preserved ejection fraction- - likely due to HTN/ OSA as cath in 2020 was without CAD - NYHA class II - euvolemic today - not weighing daily; reminded to call for an overnight weight gain of >2 pounds or a weekly weight gain of >5 pounds.  - weight up 5 pounds from last visit here 2 months ago - Echo 06/17/16: EF of 55-65%. - Echo 03/26/2018: EF of 60-65% along with mild/  moderate TR.   - Echo 09/21/22: EF 55-60% with moderate LVH, Grade II DD and mild dilatation of the aortic root, measuring 44 mm. There is mild dilatation of the ascending aorta, measuring 41 mm. There is mild dilatation of the aortic arch, measuring 39 mm.  - Catheterization 03/27/2018:  normal coronary arteries and normal left ventricular function. - not adding salt to her food. Reminded to closely follow a 2000mg  sodium diet  - saw cardiology [name]) 02/22; needs f/u appt  - continue lisinopril  40mg  daily; consider changing to entresto - continue metoprolol  tartrate 25mg  BID - continue spironolactone  25mg  daily - continue furosemide  40mg  daily/ potassium 40meq daily - consider SGLT2 - BMP today - BNP 09/01/21 was 135.3  2: HTN- - BP 117/84 - saw PCP [name]) 06/24 - continue amlodipine  10mg  daily - continue clonidine  0.1mg  daily; if BP continues to look good, try to wean off clonidine  - continue hydralazine  100mg  TID - continue lisinopril  40mg  daily - BMP 07/27/22 and it showed sodium 142, potassium 3.5, creatinine 0.71 and GFR >60 - BMP today  3: Obstructive sleep apnea- - reports sleeping well - does not have oxygen and hasn't seen pulmonology  4: Depression/ anxiety- - continues to struggle with depression/ anxiety but overall says that she's feeling well  5: Thoracic aneurysm- - stable per CT's - has been followed by [facility] cardiology; have reached out to them to get f/u scheduled since patient is over due - Echo 09/21/22: EF 55-60% with moderate LVH, Grade II DD and mild dilatation of the aortic root, measuring 44 mm. There is mild dilatation of the ascending aorta, measuring 41 mm. There is mild dilatation of the aortic arch, measuring 39 mm.  - chest CTA 01/11/22: Ascending thoracic aorta is mildly dilated measuring 4.3 cm  Return in 6 months, sooner if needed.       Charlette Console, FNP 06/13/23

## 2023-06-13 NOTE — Telephone Encounter (Signed)
 Called to confirm/remind patient of their appointment at the Advanced [facility] Failure Clinic on 06/14/23.   Appointment:   [] Confirmed  [] Left mess   [x] No answer/No voice mail  [] VM Full/unable to leave message  [] Phone not in service  Patient reminded to bring all medications and/or complete list.  Confirmed patient has transportation. Gave directions, instructed to utilize valet parking.

## 2023-06-14 ENCOUNTER — Encounter: Admitting: Family

## 2023-06-30 ENCOUNTER — Telehealth: Payer: Self-pay | Admitting: Family

## 2023-06-30 NOTE — Telephone Encounter (Signed)
 Called to confirm/remind patient of their appointment at the Advanced [facility] Failure Clinic on 07/01/23.   Appointment:   [x] Confirmed  [] Left mess   [] No answer/No voice mail  [] VM Full/unable to leave message  [] Phone not in service  Patient reminded to bring all medications and/or complete list.  Confirmed patient has transportation. Gave directions, instructed to utilize valet parking.

## 2023-07-01 ENCOUNTER — Ambulatory Visit: Attending: Family | Admitting: Family

## 2023-07-01 ENCOUNTER — Encounter: Payer: Self-pay | Admitting: Family

## 2023-07-01 ENCOUNTER — Other Ambulatory Visit: Payer: Self-pay | Admitting: Family

## 2023-07-01 ENCOUNTER — Other Ambulatory Visit: Payer: Self-pay | Admitting: Nurse Practitioner

## 2023-07-01 VITALS — BP 134/82 | HR 77 | Wt 229.0 lb

## 2023-07-01 DIAGNOSIS — I712 Thoracic aortic aneurysm, without rupture, unspecified: Secondary | ICD-10-CM | POA: Insufficient documentation

## 2023-07-01 DIAGNOSIS — E785 Hyperlipidemia, unspecified: Secondary | ICD-10-CM | POA: Diagnosis not present

## 2023-07-01 DIAGNOSIS — M109 Gout, unspecified: Secondary | ICD-10-CM | POA: Insufficient documentation

## 2023-07-01 DIAGNOSIS — I110 Hypertensive heart disease with heart failure: Secondary | ICD-10-CM | POA: Diagnosis not present

## 2023-07-01 DIAGNOSIS — J449 Chronic obstructive pulmonary disease, unspecified: Secondary | ICD-10-CM | POA: Diagnosis not present

## 2023-07-01 DIAGNOSIS — I10 Essential (primary) hypertension: Secondary | ICD-10-CM | POA: Diagnosis not present

## 2023-07-01 DIAGNOSIS — Z79899 Other long term (current) drug therapy: Secondary | ICD-10-CM | POA: Diagnosis not present

## 2023-07-01 DIAGNOSIS — Z87891 Personal history of nicotine dependence: Secondary | ICD-10-CM | POA: Diagnosis not present

## 2023-07-01 DIAGNOSIS — F32A Depression, unspecified: Secondary | ICD-10-CM | POA: Diagnosis not present

## 2023-07-01 DIAGNOSIS — Z86711 Personal history of pulmonary embolism: Secondary | ICD-10-CM | POA: Insufficient documentation

## 2023-07-01 DIAGNOSIS — I428 Other cardiomyopathies: Secondary | ICD-10-CM | POA: Insufficient documentation

## 2023-07-01 DIAGNOSIS — Z8673 Personal history of transient ischemic attack (TIA), and cerebral infarction without residual deficits: Secondary | ICD-10-CM | POA: Diagnosis not present

## 2023-07-01 DIAGNOSIS — I5032 Chronic diastolic (congestive) heart failure: Secondary | ICD-10-CM | POA: Diagnosis not present

## 2023-07-01 DIAGNOSIS — F419 Anxiety disorder, unspecified: Secondary | ICD-10-CM | POA: Insufficient documentation

## 2023-07-01 DIAGNOSIS — I7121 Aneurysm of the ascending aorta, without rupture: Secondary | ICD-10-CM

## 2023-07-01 DIAGNOSIS — G4733 Obstructive sleep apnea (adult) (pediatric): Secondary | ICD-10-CM | POA: Insufficient documentation

## 2023-07-01 DIAGNOSIS — R0602 Shortness of breath: Secondary | ICD-10-CM | POA: Diagnosis present

## 2023-07-01 DIAGNOSIS — F329 Major depressive disorder, single episode, unspecified: Secondary | ICD-10-CM | POA: Diagnosis not present

## 2023-07-01 MED ORDER — TORSEMIDE 40 MG PO TABS: 40.0000 mg | ORAL_TABLET | Freq: Every day | ORAL | 11 refills | Status: DC

## 2023-07-01 NOTE — Patient Instructions (Signed)
 Medication Changes:  DISCONTINUE  LASIX   START TORSEMIDE 40 MG ONCE DAILY   Lab Work:  Go DOWN to LOWER LEVEL (LL) to have your blood work completed inside of [employer] office.  We will only call you if the results are abnormal or if the provider would like to make medication changes.   Referrals:  We have placed a referral today to [facility] for your General Cardiology needs. Please reach out to them to get on their schedule. Their information is below on this AVS.  [facility]  - Cardiology [address] [place], [place] [address] Office: [phone]  Fax: [phone]   Follow-Up in: 1 month with Shawnee Dellen, FNP.  At the Advanced [facility] Failure Clinic, you and your health needs are our priority. We have a designated team specialized in the treatment of Heart Failure. This Care Team includes your primary Heart Failure Specialized Cardiologist (physician), Advanced Practice Providers (APPs- Physician Assistants and Nurse Practitioners), and Pharmacist who all work together to provide you with the care you need, when you need it.   You may see any of the following providers on your designated Care Team at your next follow up:  Dr. Jules Oar Dr. Peder Bourdon Dr. Alwin Baars Dr. Judyth Nunnery Shawnee Dellen, FNP Bevely Brush, RPH-CPP  Please be sure to bring in all your medications bottles to every appointment.   Need to Contact Us :  If you have any questions or concerns before your next appointment please send us  a message through [place] or call our office at [phone].    TO LEAVE A MESSAGE FOR THE NURSE SELECT OPTION 2, PLEASE LEAVE A MESSAGE INCLUDING: YOUR NAME DATE OF BIRTH CALL BACK NUMBER REASON FOR CALL**this is important as we prioritize the call backs  YOU WILL RECEIVE A CALL BACK THE SAME DAY AS LONG AS YOU CALL BEFORE 4:00 PM

## 2023-07-01 NOTE — Progress Notes (Signed)
 Advanced Heart Failure Clinic Note    PCP: Aileen Alexanders, NP (last seen 01/25) Primary Cardiologist: Burney Carter, MD (last seen 02/22)  Chief Complaint: shortness of breath  HPI:  [name] is a 58 y/o female with a history of pulmonary embolus, obstructive sleep apnea, atrial fibrillation, COPD, gout, HTN, CVA, hyperlipidemia, depression, anxiety, OSA, remote tobacco use and chronic heart failure.   Had a pharmacologic stress trest done 09/12/12 and had an estimated EF of 49%.   Echo 06/17/16: EF of 55-65%. Echo 03/26/2018: EF of 60-65% along with mild/ moderate TR.    Catheterization 03/27/2018:  normal coronary arteries and normal left ventricular function.  Admitted 01/11/22 due to productive cough with greenish colored sputum, shortness of breath, fever, chills, malaise, sore throat, body aches. Denies chest pain. Patient also reports nausea, vomiting, diarrhea and abdominal pain. GI pathogen panel negative but C. difficile antigen positive with negative toxin with reflex to PCR which came back positive. Influenza A PCR positive.   Echo 09/21/22: EF 55-60% with moderate LVH, Grade II DD and mild dilatation of the aortic root, measuring 44 mm. There is mild dilatation of the ascending aorta, measuring 41 mm. There is mild dilatation of the aortic arch, measuring 39 mm.   Was in the ED 11/11/22 with flulike symptoms for 1-2 days. CXR negative. Potassium corrected. Elevated BP thought to be due to vomiting.   Was in the ED 05/19/23 with chest pain & vomiting after taking meds. Found to be in AF RVR. CXR negative, labs normal and EKG without evidence of STEMI. Pain improved and she was released.    She presents today for a follow-up visit with a chief complaint of moderate shortness of breath. Has associated fatigue, headache, abdominal distention, pedal edema. Denies chest pain, palpitations, dizziness. Says that she's been out of her furosemide  for ~ 1 month. Currently being treated  for gout in her left foot and is wearing a boot on it but says that the pain continues.   ROS: All systems negative except as listed in HPI, PMH and Problem List.  SH:  Social History   Socioeconomic History   Marital status: Legally Separated    Spouse name: Not on file   Number of children: Not on file   Years of education: Not on file   Highest education level: Not on file  Occupational History   Not on file  Tobacco Use   Smoking status: Former    Current packs/day: 0.00    Types: Cigarettes    Quit date: 01/26/2016    Years since quitting: 7.4   Smokeless tobacco: Never  Vaping Use   Vaping status: Never Used  Substance and Sexual Activity   Alcohol use: Yes    Comment: Occassionally   Drug use: No   Sexual activity: Never  Other Topics Concern   Not on file  Social History Narrative   Not on file   Social Drivers of Health   Financial Resource Strain: Not on file  Food Insecurity: No Food Insecurity (01/11/2022)   Hunger Vital Sign    Worried About Running Out of Food in the Last Year: Never true    Ran Out of Food in the Last Year: Never true  Transportation Needs: No Transportation Needs (01/11/2022)   PRAPARE - Administrator, Civil Service (Medical): No    Lack of Transportation (Non-Medical): No  Physical Activity: Not on file  Stress: Not on file  Social Connections: Not on file  Intimate Partner Violence: Not At Risk (01/11/2022)   Humiliation, Afraid, Rape, and Kick questionnaire    Fear of Current or Ex-Partner: No    Emotionally Abused: No    Physically Abused: No    Sexually Abused: No    FH:  Family History  Problem Relation Age of Onset   Prostate cancer Father    Rectal cancer Sister    Breast cancer Sister    Cancer Maternal Grandmother    Cancer Maternal Grandfather     Past Medical History:  Diagnosis Date   A-fib [facility]) 2010   Adrenal adenoma, left 10/06/2004   a.) CT 10/06/2004 --> measured 3.1 x 1.6 cm   Alcohol  abuse    Allergy    Anginal pain (HCC)    Anxiety    Cardiac murmur    CHF (congestive heart failure) (HCC)    COPD (chronic obstructive pulmonary disease) (HCC)    Diverticulosis    Fatty liver    GERD (gastroesophageal reflux disease)    Gout    History of cocaine abuse (HCC)    a.) reports that use discontinued following CVA in 2010.   History of kidney stones    Hypertension    Mild cognitive impairment    Non-healing non-surgical wound    right leg   Obesity    On supplemental oxygen therapy    a.) 2 L/[place]   OSA on CPAP    Pulmonary embolus (HCC) 02/19/2012   a.) CTA --> RIGHT upper lobe.   Stroke [facility]) 2010   Thoracic ascending aortic aneurysm (HCC)    a.) CT 10/28/2019 --> measured 4.6 cm.   Thyroid disease    Tricuspid valvular regurgitation    a.) TTE 03/26/2018 --> mild to moderate.    Current Outpatient Medications  Medication Sig Dispense Refill   albuterol  (VENTOLIN  HFA) 108 (90 Base) MCG/ACT inhaler Inhale 2 puffs into the lungs every 6 (six) hours as needed for wheezing or shortness of breath. 18 g 2   allopurinol  (ZYLOPRIM ) 300 MG tablet Take 1 tablet (300 mg total) by mouth daily. 90 tablet 1   amLODipine  (NORVASC ) 10 MG tablet TAKE 1 TABLET BY MOUTH EVERY DAY 90 tablet 0   aspirin  EC 81 MG tablet Take 1 tablet (81 mg total) by mouth daily. 30 tablet 1   atorvastatin  (LIPITOR) 40 MG tablet TAKE 1 TABLET BY MOUTH EVERY DAY 90 tablet 0   Cholecalciferol  (VITAMIN D3) 25 MCG (1000 UT) CAPS Take 1 capsule (1,000 Units total) by mouth daily. 90 capsule 1   citalopram  (CELEXA ) 10 MG tablet TAKE 1 TABLET BY MOUTH EVERY DAY 90 tablet 0   cloNIDine  (CATAPRES ) 0.1 MG tablet TAKE 1 TABLET BY MOUTH EVERY DAY 90 tablet 0   colchicine  0.6 MG tablet Take 1 tablet (0.6 mg total) by mouth daily. 10 tablet 2   furosemide  (LASIX ) 40 MG tablet Take 1 tablet (40 mg total) by mouth daily. 30 tablet 5   gabapentin  (NEURONTIN ) 300 MG capsule TAKE 2 CAPSULES (600 MG TOTAL) BY MOUTH  AT BEDTIME. 180 capsule 0   hydrALAZINE  (APRESOLINE ) 100 MG tablet TAKE 1 TABLET BY MOUTH 3 TIMES DAILY. 270 tablet 0   lisinopril  (ZESTRIL ) 40 MG tablet Take 1 tablet (40 mg total) by mouth daily. 90 tablet 0   metoprolol  tartrate (LOPRESSOR ) 25 MG tablet Take 1 tablet (25 mg total) by mouth 2 (two) times daily. 60 tablet 0   ondansetron  (ZOFRAN -ODT) 4 MG disintegrating tablet Take 1  tablet (4 mg total) by mouth every 6 (six) hours as needed for nausea or vomiting. 20 tablet 0   pantoprazole  (PROTONIX ) 40 MG tablet TAKE 1 TABLET BY MOUTH EVERY DAY 90 tablet 3   potassium chloride  SA (KLOR-CON  M) 20 MEQ tablet Take 1 tablet (20 mEq total) by mouth 2 (two) times daily. 60 tablet 5   spironolactone  (ALDACTONE ) 25 MG tablet Take 1 tablet (25 mg total) by mouth daily. 90 tablet 3   Tiotropium Bromide -Olodaterol 2.5-2.5 MCG/ACT AERS Inhale 2 puffs into the lungs daily. 4 g 3   No current facility-administered medications for this visit.   Vitals:   07/01/23 1007  BP: 134/82  Pulse: 77  SpO2: 95%  Weight: 229 lb (103.9 kg)   Wt Readings from Last 3 Encounters:  07/01/23 229 lb (103.9 kg)  02/08/23 237 lb (107.5 kg)  12/31/22 231 lb 9.6 oz (105.1 kg)   Lab Results  Component Value Date   CREATININE 0.73 05/19/2023   CREATININE 0.75 12/31/2022   CREATININE 0.83 11/30/2022    PHYSICAL EXAM:  General: Well appearing. No resp difficulty HEENT: normal Neck: supple, no JVD Cor: Regular rhythm, rate. No rubs, gallops or murmurs Lungs: clear Abdomen: soft, nontender, nondistended. Extremities: no cyanosis, clubbing, rash, 1+ pitting edema left lower leg; boot on left foot Neuro: alert & oriented X 3. Moves all 4 extremities w/o difficulty. Affect pleasant   ECG: not done   ASSESSMENT & PLAN:  1: NICM with preserved ejection fraction- - likely due to HTN/ OSA as cath in 2020 was without CAD - NYHA class III - mildly fluid up today with symptoms - weight down 5 pounds from last  visit here 10 months ago - Echo 06/17/16: EF of 55-65%. - Echo 03/26/2018: EF of 60-65% along with mild/ moderate TR.   - Echo 09/21/22: EF 55-60% with moderate LVH, Grade II DD and mild dilatation of the aortic root, measuring 44 mm. There is mild dilatation of the ascending aorta, measuring 41 mm. There is mild dilatation of the aortic arch, measuring 39 mm.  - Catheterization 03/27/2018:  normal coronary arteries and normal left ventricular function. - not adding salt to her food. Reminded to closely follow a 2000mg  sodium diet  - saw cardiology Anthonette Bastos) 02/22; [facility] cardiology referral made today - continue lisinopril  40mg  daily - continue metoprolol  tartrate 25mg  BID - stop furosemide  (she's been out of it ~ 1 month) and begin torsemide 40mg  daily since she gets abdominal distention - continue potassium 40meq daily - continue spironolactone  25mg  daily - consider SGLT2 at next visit once meds are brought for reconciliation  - BNP 05/19/23 was 44.4  2: HTN- - BP 134/82 - saw PCP Curt Dover) 01/25 - continue amlodipine  10mg  daily - continue clonidine  0.1mg  daily; if BP continues to look good, try to wean off clonidine  - continue hydralazine  100mg  TID - continue lisinopril  40mg  daily - BMP 05/19/23 reviewed: sodium 140, potassium 3.4, creatinine 0.73 and GFR >60 - BMP today  3: Obstructive sleep apnea- - reports sleeping well - wearing CPAP nightly  4: Depression/ anxiety- - continues to struggle with depression/ anxiety but overall says that she's feeling well  5: Thoracic aneurysm- - stable per CT's - has been followed by [facility] cardiology; referral placed today - Echo 09/21/22: EF 55-60% with moderate LVH, Grade II DD and mild dilatation of the aortic root, measuring 44 mm. There is mild dilatation of the ascending aorta, measuring 41 mm. There is mild  dilatation of the aortic arch, measuring 39 mm.  - chest CTA 01/11/22: Ascending thoracic aorta is mildly dilated  measuring 4.3 cm  6: Hyperlipidemia- - continue atorvastatin  40mg  daily; needs to be consistently taking this - LDL 12/31/22 was 145   Return in 1 month, sooner if needed.   Charlette Console, FNP 07/01/23

## 2023-07-01 NOTE — Telephone Encounter (Signed)
 Copied from CRM [id number]. Topic: Clinical - Medication Refill >> Jul 01, 2023  2:36 PM Tanazia G wrote: Medication:   allopurinol  (ZYLOPRIM ) 300 MG tablet colchicine  0.6 MG tablet  Patient is also requesting another medication but is unsure of the name of the medication.  ** Patient also stated that her insurance does not pay for Torsemide 40 MG TABS and she needs an alternative medication.   Has the patient contacted their pharmacy? Yes (Agent: If no, request that the patient contact the pharmacy for the refill. If patient does not wish to contact the pharmacy document the reason why and proceed with request.) (Agent: If yes, when and what did the pharmacy advise?)  This is the patient's preferred pharmacy:  [id number] - [place], [place] - [address] [place] [address] Phone: [phone] Fax: [phone]  Is this the correct pharmacy for this prescription? Yes If no, delete pharmacy and type the correct one.   Has the prescription been filled recently? Yes  Is the patient out of the medication? Yes  Has the patient been seen for an appointment in the last year OR does the patient have an upcoming appointment? Yes  Can we respond through MyChart? Yes  Agent: Please be advised that Rx refills may take up to 3 business days. We ask that you follow-up with your pharmacy.

## 2023-07-01 NOTE — Telephone Encounter (Signed)
 Request for allopurinol  is pending. Pt also requested colchicine , pended here. RN called pt for clarification in regards to the medication she needs refilled that she can't remember. Pt spelled out furosemide . Pt states torsemide was prescribed to her today and the pharmacy called her to tell her her insurance won't cover it.  Per cardiology notes today, pt is to stop taking furosemide  and has been out of it for 1 month. Pt instructed to take torsemide instead, prescribed by cardiology today. RN advised the pt she should call her cardiologist for guidance on what she can take instead of torsemide since her insurance won't cover it. Pt agreeable to call her cardiologist and verbalized understanding.

## 2023-07-02 LAB — BASIC METABOLIC PANEL WITH GFR
BUN/Creatinine Ratio: 15 (ref 9–23)
BUN: 10 mg/dL (ref 6–24)
CO2: 24 mmol/L (ref 20–29)
Calcium: 9.5 mg/dL (ref 8.7–10.2)
Chloride: 102 mmol/L (ref 96–106)
Creatinine, Ser: 0.68 mg/dL (ref 0.57–1.00)
Glucose: 89 mg/dL (ref 70–99)
Potassium: 3.6 mmol/L (ref 3.5–5.2)
Sodium: 143 mmol/L (ref 134–144)
eGFR: 102 mL/min/{1.73_m2} (ref >59)

## 2023-07-03 ENCOUNTER — Ambulatory Visit: Payer: Self-pay | Admitting: Family

## 2023-07-04 MED ORDER — COLCHICINE 0.6 MG PO TABS: 0.6000 mg | ORAL_TABLET | Freq: Every day | ORAL | 0 refills | Status: DC

## 2023-07-04 NOTE — Telephone Encounter (Signed)
 Requested medications are due for refill today.  unsure  Requested medications are on the active medications list.  yes  Last refill. 10/16/2021 #90 1 rf  Future visit scheduled.   no  Notes to clinic.  Labs are expired. Rx last filled 2023.    Requested Prescriptions  Pending Prescriptions Disp Refills   allopurinol  (ZYLOPRIM ) 300 MG tablet [Pharmacy Med Name: ALLOPURINOL  300 MG TABLET] 30 tablet     Sig: TAKE 1 TABLET BY MOUTH EVERY DAY     Endocrinology:  Gout Agents - allopurinol  Failed - 07/04/2023 11:34 AM      Failed - Uric Acid in normal range and within 360 days    Uric Acid  Date Value Ref Range Status  07/02/2019 8.2 (H) 3.0 - 7.2 mg/dL Final    Comment:               Therapeutic target for gout patients: <6.0         Failed - Valid encounter within last 12 months    Recent Outpatient Visits   None            Passed - Cr in normal range and within 360 days    Creatinine  Date Value Ref Range Status  01/15/2014 1.36 (H) 0.60 - 1.30 mg/dL Final   Creatinine, Ser  Date Value Ref Range Status  07/01/2023 0.68 0.57 - 1.00 mg/dL Final         Passed - CBC within normal limits and completed in the last 12 months    WBC  Date Value Ref Range Status  05/19/2023 8.8 4.0 - 10.5 K/uL Final   RBC  Date Value Ref Range Status  05/19/2023 4.28 3.87 - 5.11 MIL/uL Final   Hemoglobin  Date Value Ref Range Status  05/19/2023 13.7 12.0 - 15.0 g/dL Final  69/62/9528 41.3 11.1 - 15.9 g/dL Final   HCT  Date Value Ref Range Status  05/19/2023 41.4 36.0 - 46.0 % Final   Hematocrit  Date Value Ref Range Status  10/16/2021 42.6 34.0 - 46.6 % Final   MCHC  Date Value Ref Range Status  05/19/2023 33.1 30.0 - 36.0 g/dL Final   [facility]  Date Value Ref Range Status  05/19/2023 32.0 26.0 - 34.0 pg Final   MCV  Date Value Ref Range Status  05/19/2023 96.7 80.0 - 100.0 fL Final  10/16/2021 95 79 - 97 fL Final  01/15/2014 87 80 - 100 fL Final   No results found for:  "PLTCOUNTKUC", "LABPLAT", "POCPLA" RDW  Date Value Ref Range Status  05/19/2023 13.9 11.5 - 15.5 % Final  10/16/2021 14.0 11.7 - 15.4 % Final  01/15/2014 16.0 (H) 11.5 - 14.5 % Final

## 2023-07-04 NOTE — Telephone Encounter (Signed)
 Requested Prescriptions  Pending Prescriptions Disp Refills   colchicine  0.6 MG tablet 90 tablet 0    Sig: Take 1 tablet (0.6 mg total) by mouth daily.     Endocrinology:  Gout Agents - colchicine  Failed - 07/04/2023 12:05 PM      Failed - Valid encounter within last 12 months    Recent Outpatient Visits   None            Passed - Cr in normal range and within 360 days    Creatinine  Date Value Ref Range Status  01/15/2014 1.36 (H) 0.60 - 1.30 mg/dL Final   Creatinine, Ser  Date Value Ref Range Status  07/01/2023 0.68 0.57 - 1.00 mg/dL Final         Passed - ALT in normal range and within 360 days    ALT  Date Value Ref Range Status  05/19/2023 10 0 - 44 U/L Final    Comment:    HEMOLYSIS AT THIS LEVEL MAY AFFECT RESULT   SGPT (ALT)  Date Value Ref Range Status  01/14/2014 39 U/L Final    Comment:    14-63 NOTE: New Reference Range 08/14/13          Passed - AST in normal range and within 360 days    AST  Date Value Ref Range Status  05/19/2023 21 15 - 41 U/L Final    Comment:    HEMOLYSIS AT THIS LEVEL MAY AFFECT RESULT   SGOT(AST)  Date Value Ref Range Status  01/14/2014 43 (H) 15 - 37 Unit/L Final         Passed - CBC within normal limits and completed in the last 12 months    WBC  Date Value Ref Range Status  05/19/2023 8.8 4.0 - 10.5 K/uL Final   RBC  Date Value Ref Range Status  05/19/2023 4.28 3.87 - 5.11 MIL/uL Final   Hemoglobin  Date Value Ref Range Status  05/19/2023 13.7 12.0 - 15.0 g/dL Final  16/10/9602 54.0 11.1 - 15.9 g/dL Final   HCT  Date Value Ref Range Status  05/19/2023 41.4 36.0 - 46.0 % Final   Hematocrit  Date Value Ref Range Status  10/16/2021 42.6 34.0 - 46.6 % Final   MCHC  Date Value Ref Range Status  05/19/2023 33.1 30.0 - 36.0 g/dL Final   [facility]  Date Value Ref Range Status  05/19/2023 32.0 26.0 - 34.0 pg Final   MCV  Date Value Ref Range Status  05/19/2023 96.7 80.0 - 100.0 fL Final  10/16/2021 95 79 -  97 fL Final  01/15/2014 87 80 - 100 fL Final   No results found for: "PLTCOUNTKUC", "LABPLAT", "POCPLA" RDW  Date Value Ref Range Status  05/19/2023 13.9 11.5 - 15.5 % Final  10/16/2021 14.0 11.7 - 15.4 % Final  01/15/2014 16.0 (H) 11.5 - 14.5 % Final

## 2023-07-14 ENCOUNTER — Ambulatory Visit (INDEPENDENT_AMBULATORY_CARE_PROVIDER_SITE_OTHER): Admitting: Nurse Practitioner

## 2023-07-14 ENCOUNTER — Encounter: Payer: Self-pay | Admitting: Nurse Practitioner

## 2023-07-14 VITALS — BP 138/82 | HR 77 | Ht 70.0 in | Wt 223.0 lb

## 2023-07-14 DIAGNOSIS — I5032 Chronic diastolic (congestive) heart failure: Secondary | ICD-10-CM

## 2023-07-14 DIAGNOSIS — J449 Chronic obstructive pulmonary disease, unspecified: Secondary | ICD-10-CM

## 2023-07-14 DIAGNOSIS — R7301 Impaired fasting glucose: Secondary | ICD-10-CM | POA: Diagnosis not present

## 2023-07-14 DIAGNOSIS — I482 Chronic atrial fibrillation, unspecified: Secondary | ICD-10-CM | POA: Diagnosis not present

## 2023-07-14 DIAGNOSIS — Z1231 Encounter for screening mammogram for malignant neoplasm of breast: Secondary | ICD-10-CM

## 2023-07-14 DIAGNOSIS — I10 Essential (primary) hypertension: Secondary | ICD-10-CM | POA: Diagnosis not present

## 2023-07-14 DIAGNOSIS — E785 Hyperlipidemia, unspecified: Secondary | ICD-10-CM

## 2023-07-14 DIAGNOSIS — G4733 Obstructive sleep apnea (adult) (pediatric): Secondary | ICD-10-CM

## 2023-07-14 DIAGNOSIS — Z1211 Encounter for screening for malignant neoplasm of colon: Secondary | ICD-10-CM

## 2023-07-14 MED ORDER — TORSEMIDE 20 MG PO TABS: 40.0000 mg | ORAL_TABLET | Freq: Every day | ORAL | 1 refills | Status: AC

## 2023-07-14 NOTE — Assessment & Plan Note (Signed)
 Chronic.  Ongoing concern.  Has not seen pulmonology.  New referral placed today.

## 2023-07-14 NOTE — Assessment & Plan Note (Signed)
Chronic.  Controlled.  Continue with current medication regimen.  Labs ordered today.  Return to clinic in 3 months for reevaluation.  Call sooner if concerns arise.   

## 2023-07-14 NOTE — Assessment & Plan Note (Signed)
 Chronic.  Recently saw HF clinic on 07/01/23.  Furosemide  was changed to Torsemide  due to abdominal distention.  Patient has not picked up prescription- states it isn't covered and was told our office would figure this out for her.  Her weight is improved 6lbs from HF visit.  She does not weigh daily.  Dose endorse lower extremity swelling.  Has not made an appointment with Cardiology but knows this was recommended by HF clinic and plans to do so today.  Labs ordered during visit.

## 2023-07-14 NOTE — Progress Notes (Signed)
 BP 138/82 Comment: home blood pressure reading  Pulse 77   Ht 5' 10 (1.778 m)   Wt 223 lb (101.2 kg)   BMI 32.00 kg/m    Subjective:    Patient ID: [name], female    DOB: 08/14/1965, 58 y.o.   MRN: [id number]  HPI: [name] is a 58 y.o. female  Chief Complaint  Patient presents with   Medical Management of Chronic Issues    Having trouble with fluid pill and [facility] Dellen refilling   Sleep Apnea    Needs new machine and order sent to [facility] in [place]   OXYGEN Patient states she has not made an appt with Pulmonology.  Not on portable oxygen.  Breathing well in office today.  Reports that oxygen at home runs around 95%.  HYPERTENSION / HYPERLIPIDEMIA Patient states she does not weigh Randall.   Has not seen cardiology since last visit.  Recently saw HF clinic who placed a referral for Cardiology.  She has not started the Torsemide  due insurance issue.  Patient was told the PCP office would figure out how to get it approved for her.    ECHO in August 2024-EF of 55-60%.  Satisfied with current treatment? yes Duration of hypertension: years BP monitoring frequency: not checking BP range: 138/80 BP medication side effects: no Past BP meds: hydralazine  clonidine , amlodipine , and lisinopril  Duration of hyperlipidemia: years Cholesterol medication side effects: no Cholesterol supplements: none Past cholesterol medications: atorvastain (lipitor),  Medication compliance: excellent compliance Aspirin : yes Recent stressors: no Recurrent headaches: no Visual changes: no Palpitations: no Dyspnea: yes Chest pain: no Lower extremity edema: no Dizzy/lightheaded: no  COPD Not using Spiriva  consistently. Does not see Pulmonology.  Has not made an appointment from previous referrals. COPD status: improved Satisfied with current treatment?: no Oxygen use: no - would like oxygen. She feels like she would benefit from oxygen to help her increase her ability to  walk further Dyspnea frequency: Randall Cough frequency: occasionally Rescue inhaler frequency:  a couple times a week Limitation of activity: yes Productive cough:  Last Spirometry:  Pneumovax: Up to Date Influenza: Not up to Date    Relevant past medical, surgical, family and social history reviewed and updated as indicated. Interim medical history since our last visit reviewed. Allergies and medications reviewed and updated.  Review of Systems  Eyes:  Negative for visual disturbance.  Respiratory:  Positive for shortness of breath. Negative for cough and chest tightness.   Cardiovascular:  Negative for chest pain, palpitations and leg swelling.  Neurological:  Negative for dizziness and headaches.  Psychiatric/Behavioral:  Positive for dysphoric mood. Negative for suicidal ideas. The patient is nervous/anxious.     Per HPI unless specifically indicated above     Objective:    BP 138/82 Comment: home blood pressure reading  Pulse 77   Ht 5' 10 (1.778 m)   Wt 223 lb (101.2 kg)   BMI 32.00 kg/m   Wt Readings from Last 3 Encounters:  07/14/23 223 lb (101.2 kg)  07/01/23 229 lb (103.9 kg)  02/08/23 237 lb (107.5 kg)    Physical Exam Vitals and nursing note reviewed.  Constitutional:      General: She is not in acute distress.    Appearance: Normal appearance. She is obese. She is not ill-appearing, toxic-appearing or diaphoretic.  HENT:     Head: Normocephalic.     Right Ear: External ear normal.     Left Ear: External ear normal.  Nose: Nose normal.     Mouth/Throat:     Mouth: Mucous membranes are moist.     Pharynx: Oropharynx is clear.   Eyes:     General:        Right eye: No discharge.        Left eye: No discharge.     Extraocular Movements: Extraocular movements intact.     Conjunctiva/sclera: Conjunctivae normal.     Pupils: Pupils are equal, round, and reactive to light.    Cardiovascular:     Rate and Rhythm: Normal rate. Rhythm irregular.      Heart sounds: No murmur heard. Pulmonary:     Effort: Pulmonary effort is normal. No respiratory distress.     Breath sounds: Normal breath sounds. No wheezing or rales.   Musculoskeletal:     Cervical back: Normal range of motion and neck supple.     Right lower leg: No edema.     Left lower leg: No edema.   Skin:    General: Skin is warm and dry.     Capillary Refill: Capillary refill takes less than 2 seconds.   Neurological:     General: No focal deficit present.     Mental Status: She is alert and oriented to person, place, and time. Mental status is at baseline.   Psychiatric:        Mood and Affect: Mood normal.        Behavior: Behavior normal.        Thought Content: Thought content normal.        Judgment: Judgment normal.     Results for orders placed or performed in visit on 07/01/23  Basic metabolic panel with GFR   Collection Time: 07/01/23 11:02 AM  Result Value Ref Range   Glucose 89 70 - 99 mg/dL   BUN 10 6 - 24 mg/dL   Creatinine, Ser 7.84 0.57 - 1.00 mg/dL   eGFR 696 >29 BM/WUX/3.24   BUN/Creatinine Ratio 15 9 - 23   Sodium 143 134 - 144 mmol/L   Potassium 3.6 3.5 - 5.2 mmol/L   Chloride 102 96 - 106 mmol/L   CO2 24 20 - 29 mmol/L   Calcium  9.5 8.7 - 10.2 mg/dL      Assessment & Plan:   Problem List Items Addressed This Visit       Cardiovascular and Mediastinum   Chronic diastolic heart failure (HCC) - Primary (Chronic)   Chronic.  Recently saw HF clinic on 07/01/23.  Furosemide  was changed to Torsemide  due to abdominal distention.  Patient has not picked up prescription- states it isn't covered and was told our office would figure this out for her.  Her weight is improved 6lbs from HF visit.  She does not weigh Randall.  Dose endorse lower extremity swelling.  Has not made an appointment with Cardiology but knows this was recommended by HF clinic and plans to do so today.  Labs ordered during visit.       Relevant Medications   torsemide   (DEMADEX ) 20 MG tablet   Essential hypertension   Chronic.  Fluctuates.  Continue with current medication regimen of Amlodipine , Clonidine , Metoprolol  and Lisinopril .  Admits to noncompliance with medications.  Encouraged patient to take medications as prescribed.  Elevated at visit but per patient she checks at home regularly and blood pressure is 130/80.   Refills only sent for 3 months due to non compliance with appointments.  Will need to keep appointments for further  refills. Labs ordered today.  Return to clinic in 3 months for reevaluation.  Call sooner if concerns arise.       Relevant Medications   torsemide  (DEMADEX ) 20 MG tablet   Other Relevant Orders   Comprehensive metabolic panel with GFR   Atrial fibrillation, chronic (HCC)   Relevant Medications   torsemide  (DEMADEX ) 20 MG tablet     Respiratory   COPD (chronic obstructive pulmonary disease) (HCC)   Chronic.  Ongoing concern.  Has not seen pulmonology.  New referral placed today.       Relevant Orders   Ambulatory referral to Pulmonology   OSA on CPAP   Chronic.  Sleep study is over 54 years old. Will place new referral today.       Relevant Orders   Ambulatory referral to Sleep Studies     Endocrine   IFG (impaired fasting glucose)   Labs ordered at visit today.  Will make recommendations based on lab results.        Relevant Orders   Hemoglobin A1c     Other   Hyperlipidemia   Chronic.  Controlled.  Continue with current medication regimen.  Labs ordered today.  Return to clinic in 3 months for reevaluation.  Call sooner if concerns arise.       Relevant Medications   torsemide  (DEMADEX ) 20 MG tablet   Other Relevant Orders   Lipid panel   Morbid obesity (HCC)   Has OSA.  Working to get new sleep study. If approved can get patient on Zepbound to help with symptoms.      Other Visit Diagnoses       Screening for colon cancer       Relevant Orders   Ambulatory referral to Gastroenterology      Encounter for screening mammogram for malignant neoplasm of breast       Relevant Orders   MM 3D SCREENING MAMMOGRAM BILATERAL BREAST         Follow up plan: Return in about 3 months (around 10/14/2023) for HTN, HLD, DM2 FU.

## 2023-07-14 NOTE — Assessment & Plan Note (Signed)
 Labs ordered at visit today.  Will make recommendations based on lab results.

## 2023-07-14 NOTE — Assessment & Plan Note (Signed)
 Has OSA.  Working to get new sleep study. If approved can get patient on Zepbound to help with symptoms.

## 2023-07-14 NOTE — Assessment & Plan Note (Signed)
 Chronic.  Sleep study is over 57 years old. Will place new referral today.

## 2023-07-14 NOTE — Assessment & Plan Note (Signed)
 Chronic.  Fluctuates.  Continue with current medication regimen of Amlodipine , Clonidine , Metoprolol  and Lisinopril .  Admits to noncompliance with medications.  Encouraged patient to take medications as prescribed.  Elevated at visit but per patient she checks at home regularly and blood pressure is 130/80.   Refills only sent for 3 months due to non compliance with appointments.  Will need to keep appointments for further refills. Labs ordered today.  Return to clinic in 3 months for reevaluation.  Call sooner if concerns arise.

## 2023-07-15 ENCOUNTER — Ambulatory Visit: Payer: Self-pay | Admitting: Nurse Practitioner

## 2023-07-15 ENCOUNTER — Encounter: Payer: Self-pay | Admitting: Sleep Medicine

## 2023-07-15 DIAGNOSIS — I10 Essential (primary) hypertension: Secondary | ICD-10-CM

## 2023-07-15 LAB — HEMOGLOBIN A1C
Est. average glucose Bld gHb Est-mCnc: 126 mg/dL
Hgb A1c MFr Bld: 6 % — ABNORMAL HIGH (ref 4.8–5.6)

## 2023-07-15 LAB — COMPREHENSIVE METABOLIC PANEL WITH GFR
ALT: 8 IU/L (ref 0–32)
AST: 15 IU/L (ref 0–40)
Albumin: 4.3 g/dL (ref 3.8–4.9)
Alkaline Phosphatase: 70 IU/L (ref 44–121)
BUN/Creatinine Ratio: 14 (ref 9–23)
BUN: 10 mg/dL (ref 6–24)
Bilirubin Total: 0.7 mg/dL (ref 0.0–1.2)
CO2: 24 mmol/L (ref 20–29)
Calcium: 9.6 mg/dL (ref 8.7–10.2)
Chloride: 105 mmol/L (ref 96–106)
Creatinine, Ser: 0.71 mg/dL (ref 0.57–1.00)
Globulin, Total: 2.4 g/dL (ref 1.5–4.5)
Glucose: 97 mg/dL (ref 70–99)
Potassium: 3.2 mmol/L — ABNORMAL LOW (ref 3.5–5.2)
Sodium: 147 mmol/L — ABNORMAL HIGH (ref 134–144)
Total Protein: 6.7 g/dL (ref 6.0–8.5)
eGFR: 99 mL/min/{1.73_m2} (ref >59)

## 2023-07-15 LAB — LIPID PANEL
Chol/HDL Ratio: 3.8 ratio (ref 0.0–4.4)
Cholesterol, Total: 171 mg/dL (ref 100–199)
HDL: 45 mg/dL (ref >39)
LDL Chol Calc (NIH): 109 mg/dL — ABNORMAL HIGH (ref 0–99)
Triglycerides: 92 mg/dL (ref 0–149)
VLDL Cholesterol Cal: 17 mg/dL (ref 5–40)

## 2023-07-25 ENCOUNTER — Telehealth: Payer: Self-pay

## 2023-07-25 NOTE — Telephone Encounter (Signed)
 Copied from CRM [id number]. Topic: Referral - Status >> Jul 25, 2023 10:49 AM Gustabo D wrote: Luke calling from [facility] patient was a no show multiples and will need to be seen somewhere else

## 2023-07-25 NOTE — Telephone Encounter (Signed)
 Routing to referral team. Can patient be sent elsewhere?

## 2023-07-28 ENCOUNTER — Other Ambulatory Visit

## 2023-08-03 ENCOUNTER — Other Ambulatory Visit: Payer: Self-pay | Admitting: Pediatrics

## 2023-08-03 DIAGNOSIS — J449 Chronic obstructive pulmonary disease, unspecified: Secondary | ICD-10-CM

## 2023-08-04 ENCOUNTER — Telehealth: Payer: Self-pay | Admitting: Family

## 2023-08-04 NOTE — Telephone Encounter (Signed)
 Requested Prescriptions  Pending Prescriptions Disp Refills   VENTOLIN  HFA 108 (90 Base) MCG/ACT inhaler [Pharmacy Med Name: VENTOLIN  HFA 90 MCG INHALER] 18 each 2    Sig: TAKE 2 PUFFS BY MOUTH EVERY 6 HOURS AS NEEDED FOR WHEEZE OR SHORTNESS OF BREATH     Pulmonology:  Beta Agonists 2 Passed - 08/04/2023  4:53 PM      Passed - Last BP in normal range    BP Readings from Last 1 Encounters:  07/14/23 138/82         Passed - Last Heart Rate in normal range    Pulse Readings from Last 1 Encounters:  07/14/23 77         Passed - Valid encounter within last 12 months    Recent Outpatient Visits           3 weeks ago Chronic diastolic heart failure [facility])   [facility] Melvin Pao, NP

## 2023-08-04 NOTE — Progress Notes (Signed)
 Advanced [facility] Failure Clinic Note    PCP: Melvin Pao, NP  Primary Cardiologist: Florencio Kava, MD (last seen 02/22)  Chief Complaint: shortness of breath   HPI:  Ms Crist is a 58 y/o female with a history of pulmonary embolus, obstructive sleep apnea, atrial fibrillation, COPD, gout, HTN, CVA, hyperlipidemia, depression, anxiety, OSA, remote tobacco use and chronic heart failure.   Had a pharmacologic stress trest done 09/12/12 and had an estimated EF of 49%.   Echo 06/17/16: EF of 55-65%. Echo 03/26/2018: EF of 60-65% along with mild/ moderate TR.    Catheterization 03/27/2018:  normal coronary arteries and normal left ventricular function.  Admitted 01/11/22 due to productive cough with greenish colored sputum, shortness of breath, fever, chills, malaise, sore throat, body aches. Denies chest pain. Patient also reports nausea, vomiting, diarrhea and abdominal pain. GI pathogen panel negative but C. difficile antigen positive with negative toxin with reflex to PCR which came back positive. Influenza A PCR positive.   Echo 09/21/22: EF 55-60% with moderate LVH, Grade II DD and mild dilatation of the aortic root, measuring 44 mm. There is mild dilatation of the ascending aorta, measuring 41 mm. There is mild dilatation of the aortic arch, measuring 39 mm.   Was in the ED 11/11/22 with flulike symptoms for 1-2 days. CXR negative. Potassium corrected. Elevated BP thought to be due to vomiting.   Was in the ED 05/19/23 with chest pain & vomiting after taking meds. Found to be in AF RVR. CXR negative, labs normal and EKG without evidence of STEMI. Pain improved and she was released.    Seen in [facility] 06/25 where diuretic was changed to torsemide .   She presents today for a follow-up visit with a chief complaint of shortness of breath. Has associated fatigue. Sleeping well on 4 pillows. Wearing CPAP nightly. Has not taken meds this morning and says that she occasionally doesn't take her  medications when she's feeling bad.   ROS: All systems negative except as listed in HPI, PMH and Problem List.  SH:  Social History   Socioeconomic History   Marital status: Legally Separated    Spouse name: Not on file   Number of children: Not on file   Years of education: Not on file   Highest education level: Not on file  Occupational History   Not on file  Tobacco Use   Smoking status: Former    Current packs/day: 0.00    Types: Cigarettes    Quit date: 01/26/2016    Years since quitting: 7.5   Smokeless tobacco: Never  Vaping Use   Vaping status: Never Used  Substance and Sexual Activity   Alcohol use: Yes    Comment: Occassionally   Drug use: No   Sexual activity: Never  Other Topics Concern   Not on file  Social History Narrative   Not on file   Social Drivers of Health   Financial Resource Strain: Not on file  Food Insecurity: No Food Insecurity (01/11/2022)   Hunger Vital Sign    Worried About Running Out of Food in the Last Year: Never true    Ran Out of Food in the Last Year: Never true  Transportation Needs: No Transportation Needs (01/11/2022)   PRAPARE - Administrator, Civil Service (Medical): No    Lack of Transportation (Non-Medical): No  Physical Activity: Not on file  Stress: Not on file  Social Connections: Not on file  Intimate Partner Violence: Not At Risk (  01/11/2022)   Humiliation, Afraid, Rape, and Kick questionnaire    Fear of Current or Ex-Partner: No    Emotionally Abused: No    Physically Abused: No    Sexually Abused: No    FH:  Family History  Problem Relation Age of Onset   Prostate cancer Father    Rectal cancer Sister    Breast cancer Sister    Cancer Maternal Grandmother    Cancer Maternal Grandfather     Past Medical History:  Diagnosis Date   A-fib [facility]) 2010   Adrenal adenoma, left 10/06/2004   a.) CT 10/06/2004 --> measured 3.1 x 1.6 cm   Alcohol abuse    Allergy    Anginal pain (HCC)    Anxiety     Cardiac murmur    CHF (congestive heart failure) (HCC)    COPD (chronic obstructive pulmonary disease) (HCC)    Diverticulosis    Fatty liver    GERD (gastroesophageal reflux disease)    Gout    History of cocaine abuse (HCC)    a.) reports that use discontinued following CVA in 2010.   History of kidney stones    Hypertension    Mild cognitive impairment    Non-healing non-surgical wound    right leg   Obesity    On supplemental oxygen therapy    a.) 2 L/[place]   OSA on CPAP    Pulmonary embolus (HCC) 02/19/2012   a.) CTA --> RIGHT upper lobe.   Stroke [facility]) 2010   Thoracic ascending aortic aneurysm (HCC)    a.) CT 10/28/2019 --> measured 4.6 cm.   Thyroid disease    Tricuspid valvular regurgitation    a.) TTE 03/26/2018 --> mild to moderate.    Current Outpatient Medications  Medication Sig Dispense Refill   albuterol  (VENTOLIN  HFA) 108 (90 Base) MCG/ACT inhaler Inhale 2 puffs into the lungs every 6 (six) hours as needed for wheezing or shortness of breath. 18 g 2   allopurinol  (ZYLOPRIM ) 300 MG tablet Take 1 tablet (300 mg total) by mouth daily. 90 tablet 1   amLODipine  (NORVASC ) 10 MG tablet TAKE 1 TABLET BY MOUTH EVERY DAY 90 tablet 0   aspirin  EC 81 MG tablet Take 1 tablet (81 mg total) by mouth daily. 30 tablet 1   atorvastatin  (LIPITOR) 40 MG tablet TAKE 1 TABLET BY MOUTH EVERY DAY 90 tablet 0   Cholecalciferol  (VITAMIN D3) 25 MCG (1000 UT) CAPS Take 1 capsule (1,000 Units total) by mouth daily. 90 capsule 1   citalopram  (CELEXA ) 10 MG tablet TAKE 1 TABLET BY MOUTH EVERY DAY 90 tablet 0   cloNIDine  (CATAPRES ) 0.1 MG tablet TAKE 1 TABLET BY MOUTH EVERY DAY 90 tablet 0   colchicine  0.6 MG tablet Take 1 tablet (0.6 mg total) by mouth daily. 90 tablet 0   gabapentin  (NEURONTIN ) 300 MG capsule TAKE 2 CAPSULES (600 MG TOTAL) BY MOUTH AT BEDTIME. 180 capsule 0   hydrALAZINE  (APRESOLINE ) 100 MG tablet TAKE 1 TABLET BY MOUTH 3 TIMES DAILY. 270 tablet 0   lisinopril  (ZESTRIL )  40 MG tablet Take 1 tablet (40 mg total) by mouth daily. 90 tablet 0   metoprolol  tartrate (LOPRESSOR ) 25 MG tablet Take 1 tablet (25 mg total) by mouth 2 (two) times daily. 60 tablet 0   ondansetron  (ZOFRAN -ODT) 4 MG disintegrating tablet Take 1 tablet (4 mg total) by mouth every 6 (six) hours as needed for nausea or vomiting. 20 tablet 0   pantoprazole  (PROTONIX ) 40 MG  tablet TAKE 1 TABLET BY MOUTH EVERY DAY 90 tablet 3   potassium chloride  SA (KLOR-CON  M) 20 MEQ tablet Take 1 tablet (20 mEq total) by mouth 2 (two) times daily. 60 tablet 5   spironolactone  (ALDACTONE ) 25 MG tablet Take 1 tablet (25 mg total) by mouth daily. 90 tablet 3   Tiotropium Bromide -Olodaterol 2.5-2.5 MCG/ACT AERS Inhale 2 puffs into the lungs daily. 4 g 3   torsemide  (DEMADEX ) 20 MG tablet Take 2 tablets (40 mg total) by mouth daily. 180 tablet 1   No current facility-administered medications for this visit.   Vitals:   08/05/23 1026  BP: (!) 155/103  Pulse: 87  SpO2: 97%  Weight: 228 lb (103.4 kg)   Wt Readings from Last 3 Encounters:  08/05/23 228 lb (103.4 kg)  07/14/23 223 lb (101.2 kg)  07/01/23 229 lb (103.9 kg)   Lab Results  Component Value Date   CREATININE 0.71 08/05/2023   CREATININE 0.71 07/14/2023   CREATININE 0.68 07/01/2023   PHYSICAL EXAM:  General: Well appearing. No resp difficulty HEENT: normal Neck: supple, no JVD Cor: Regular rhythm, rate. No rubs, gallops or murmurs Lungs: clear Abdomen: soft, nontender, nondistended. Extremities: no cyanosis, clubbing, rash, edema Neuro: alert & oriented X 3. Moves all 4 extremities w/o difficulty. Affect pleasant   ECG: not done   ASSESSMENT & PLAN:  1: NICM with preserved ejection fraction- - likely due to HTN/ OSA as cath in 2020 was without CAD - NYHA class II - euvolemic - weight stable from last visit here 1 month ago; she says that by home weight, she has declined - Echo 06/17/16: EF of 55-65%. - Echo 03/26/2018: EF of 60-65%  along with mild/ moderate TR.   - Echo 09/21/22: EF 55-60% with moderate LVH, Grade II DD and mild dilatation of the aortic root, measuring 44 mm. There is mild dilatation of the ascending aorta, measuring 41 mm. There is mild dilatation of the aortic arch, measuring 39 mm.  - Catheterization 03/27/2018: normal coronary arteries and normal left ventricular function. - not adding salt to her food. Reminded to closely follow a 2000mg  sodium diet  - saw cardiology Melva) 02/22; [facility] cardiology referral made at last visit 06/25 - continue lisinopril  40mg  daily - continue metoprolol  tartrate 25mg  BID - continue torsemide  40mg  daily  - continue potassium 60meq daily - continue spironolactone  25mg  daily - begin jardiance  10mg  daily; BMET today and repeat at next visit - BNP 05/19/23 was 44.4  2: HTN- - BP 155/103; has not taken meds yet this morning and admits that she occasionally doesn't take them at all - saw PCP [facility]) 06/25 - continue amlodipine  10mg  daily - continue clonidine  0.1mg  daily - continue hydralazine  100mg  TID - continue lisinopril  40mg  daily - BMP 07/14/23 reviewed: sodium 147, potassium 3.2, creatinine 0.71 and GFR 99 - BMP today  3: Obstructive sleep apnea- - reports sleeping well - wearing CPAP nightly - to see pulmonology 08/25  4: Depression/ anxiety- - continues to struggle with depression/ anxiety but overall says that she's feeling well  5: Thoracic aneurysm- - stable per CT's - has been followed by [facility] cardiology; referral placed last visit - Echo 09/21/22: EF 55-60% with moderate LVH, Grade II DD and mild dilatation of the aortic root, measuring 44 mm. There is mild dilatation of the ascending aorta, measuring 41 mm. There is mild dilatation of the aortic arch, measuring 39 mm.  - chest CTA 01/11/22: Ascending thoracic aorta is  mildly dilated measuring 4.3 cm  6: Hyperlipidemia- - continue atorvastatin  40mg  daily; needs to be  consistently taking this - LDL 07/14/23 was 109 - discussed increasing atorvastatin  but she declines as she'd like to try consistently taking it - recheck lipids in 3-4 months   Return in 1 month, sooner if needed. Encouraged consistent medication compliance.   Ellouise DELENA Class, FNP 08/04/23

## 2023-08-04 NOTE — Telephone Encounter (Signed)
 Called to confirm/remind patient of their appointment at the Advanced Heart Failure Clinic on 08/05/23.   Appointment:   [x] Confirmed  [] Left mess   [] No answer/No voice mail  [] VM Full/unable to leave message  [] Phone not in service  Patient reminded to bring all medications and/or complete list.  Confirmed patient has transportation. Gave directions, instructed to utilize valet parking.

## 2023-08-05 ENCOUNTER — Other Ambulatory Visit (HOSPITAL_COMMUNITY): Payer: Self-pay

## 2023-08-05 ENCOUNTER — Encounter: Payer: Self-pay | Admitting: Family

## 2023-08-05 ENCOUNTER — Other Ambulatory Visit
Admission: RE | Admit: 2023-08-05 | Discharge: 2023-08-05 | Disposition: A | Discharge status: Home / Self Care | Source: Ambulatory Visit | Attending: Family | Admitting: Family

## 2023-08-05 ENCOUNTER — Ambulatory Visit: Admitting: Family

## 2023-08-05 ENCOUNTER — Ambulatory Visit: Payer: Self-pay | Admitting: Family

## 2023-08-05 ENCOUNTER — Telehealth: Payer: Self-pay

## 2023-08-05 VITALS — BP 155/103 | HR 87 | Wt 228.0 lb

## 2023-08-05 DIAGNOSIS — I10 Essential (primary) hypertension: Secondary | ICD-10-CM | POA: Diagnosis not present

## 2023-08-05 DIAGNOSIS — G4733 Obstructive sleep apnea (adult) (pediatric): Secondary | ICD-10-CM

## 2023-08-05 DIAGNOSIS — F32A Depression, unspecified: Secondary | ICD-10-CM | POA: Diagnosis not present

## 2023-08-05 DIAGNOSIS — I110 Hypertensive heart disease with heart failure: Secondary | ICD-10-CM | POA: Diagnosis not present

## 2023-08-05 DIAGNOSIS — I712 Thoracic aortic aneurysm, without rupture, unspecified: Secondary | ICD-10-CM | POA: Insufficient documentation

## 2023-08-05 DIAGNOSIS — I428 Other cardiomyopathies: Secondary | ICD-10-CM | POA: Diagnosis not present

## 2023-08-05 DIAGNOSIS — F329 Major depressive disorder, single episode, unspecified: Secondary | ICD-10-CM

## 2023-08-05 DIAGNOSIS — E782 Mixed hyperlipidemia: Secondary | ICD-10-CM

## 2023-08-05 DIAGNOSIS — I5032 Chronic diastolic (congestive) heart failure: Secondary | ICD-10-CM | POA: Insufficient documentation

## 2023-08-05 DIAGNOSIS — F419 Anxiety disorder, unspecified: Secondary | ICD-10-CM | POA: Insufficient documentation

## 2023-08-05 DIAGNOSIS — E785 Hyperlipidemia, unspecified: Secondary | ICD-10-CM | POA: Insufficient documentation

## 2023-08-05 DIAGNOSIS — J101 Influenza due to other identified influenza virus with other respiratory manifestations: Secondary | ICD-10-CM | POA: Diagnosis not present

## 2023-08-05 DIAGNOSIS — J449 Chronic obstructive pulmonary disease, unspecified: Secondary | ICD-10-CM | POA: Diagnosis not present

## 2023-08-05 DIAGNOSIS — Z8673 Personal history of transient ischemic attack (TIA), and cerebral infarction without residual deficits: Secondary | ICD-10-CM | POA: Insufficient documentation

## 2023-08-05 DIAGNOSIS — Z79899 Other long term (current) drug therapy: Secondary | ICD-10-CM | POA: Diagnosis not present

## 2023-08-05 DIAGNOSIS — Z86711 Personal history of pulmonary embolism: Secondary | ICD-10-CM | POA: Insufficient documentation

## 2023-08-05 DIAGNOSIS — Z87891 Personal history of nicotine dependence: Secondary | ICD-10-CM | POA: Diagnosis not present

## 2023-08-05 DIAGNOSIS — I7121 Aneurysm of the ascending aorta, without rupture: Secondary | ICD-10-CM

## 2023-08-05 DIAGNOSIS — M109 Gout, unspecified: Secondary | ICD-10-CM | POA: Insufficient documentation

## 2023-08-05 LAB — BASIC METABOLIC PANEL WITH GFR
Anion gap: 11 (ref 5–15)
BUN: 16 mg/dL (ref 6–20)
CO2: 27 mmol/L (ref 22–32)
Calcium: 9.1 mg/dL (ref 8.9–10.3)
Chloride: 103 mmol/L (ref 98–111)
Creatinine, Ser: 0.71 mg/dL (ref 0.44–1.00)
GFR, Estimated: >60 mL/min (ref >60)
Glucose, Bld: 106 mg/dL — ABNORMAL HIGH (ref 70–99)
Potassium: 3.4 mmol/L — ABNORMAL LOW (ref 3.5–5.1)
Sodium: 141 mmol/L (ref 135–145)

## 2023-08-05 MED ORDER — POTASSIUM CHLORIDE CRYS ER 20 MEQ PO TBCR: 60.0000 meq | EXTENDED_RELEASE_TABLET | Freq: Every day | ORAL | Status: AC

## 2023-08-05 MED ORDER — EMPAGLIFLOZIN 10 MG PO TABS: 10.0000 mg | ORAL_TABLET | Freq: Every day | ORAL | 3 refills | Status: AC

## 2023-08-05 NOTE — Patient Instructions (Addendum)
 Medication Changes:  Begin Jardiance  10 MG once daily  Make sure you take your lipitor every day.   Lab Work:  Go over to the [facility]. Go pass the gift shop and have your blood work completed.  We will only call you if the results are abnormal or if the provider would like to make medication changes.   Follow-Up in: 1 month with Betty Class, FNP.  At the Advanced [facility] Failure Clinic, you and your health needs are our priority. We have a designated team specialized in the treatment of Heart Failure. This Care Team includes your primary Heart Failure Specialized Cardiologist (physician), Advanced Practice Providers (APPs- Physician Assistants and Nurse Practitioners), and Pharmacist who all work together to provide you with the care you need, when you need it.   You may see any of the following providers on your designated Care Team at your next follow up:  Dr. Toribio Fuel Dr. Ezra Shuck Dr. Ria Commander Dr. Odis Brownie Betty Class, FNP Jaun Bash, RPH-CPP  Please be sure to bring in all your medications bottles to every appointment.   Need to Contact Us :  If you have any questions or concerns before your next appointment please send us  a message through [place] or call our office at [phone].    TO LEAVE A MESSAGE FOR THE NURSE SELECT OPTION 2, PLEASE LEAVE A MESSAGE INCLUDING: YOUR NAME DATE OF BIRTH CALL BACK NUMBER REASON FOR CALL**this is important as we prioritize the call backs  YOU WILL RECEIVE A CALL BACK THE SAME DAY AS LONG AS YOU CALL BEFORE 4:00 PM

## 2023-08-05 NOTE — Telephone Encounter (Signed)
 Advanced Heart Failure Patient Advocate Encounter  Prior authorization for Jardiance  has been submitted and approved. Test billing returns $4 for 90 day supply.  Key: [id number] Effective: 08/05/2023 to 08/04/2024  Rachel DEL, CPhT Rx Patient Advocate Phone: [phone]

## 2023-09-08 ENCOUNTER — Telehealth: Payer: Self-pay | Admitting: Family

## 2023-09-08 NOTE — Progress Notes (Deleted)
 Advanced Heart Failure Clinic Note    PCP: Melvin Pao, NP  Primary Cardiologist: Florencio Kava, MD (last seen 02/22)  Chief Complaint: shortness of breath   HPI:  [name] is a 57 y/o female with a history of pulmonary embolus, obstructive sleep apnea, atrial fibrillation, COPD, gout, HTN, CVA, hyperlipidemia, depression, anxiety, OSA, remote tobacco use and chronic heart failure.   Had a pharmacologic stress trest done 09/12/12 and had an estimated EF of 49%.   Echo 06/17/16: EF of 55-65%. Echo 03/26/2018: EF of 60-65% along with mild/ moderate TR.    Catheterization 03/27/2018:  normal coronary arteries and normal left ventricular function.  Admitted 01/11/22 due to productive cough with greenish colored sputum, shortness of breath, fever, chills, malaise, sore throat, body aches. Denies chest pain. Patient also reports nausea, vomiting, diarrhea and abdominal pain. GI pathogen panel negative but C. difficile antigen positive with negative toxin with reflex to PCR which came back positive. Influenza A PCR positive.   Echo 09/21/22: EF 55-60% with moderate LVH, Grade II DD and mild dilatation of the aortic root, measuring 44 mm. There is mild dilatation of the ascending aorta, measuring 41 mm. There is mild dilatation of the aortic arch, measuring 39 mm.   Was in the ED 11/11/22 with flulike symptoms for 1-2 days. CXR negative. Potassium corrected. Elevated BP thought to be due to vomiting.   Was in the ED 05/19/23 with chest pain & vomiting after taking meds. Found to be in AF RVR. CXR negative, labs normal and EKG without evidence of STEMI. Pain improved and she was released.    Seen in [facility] 06/25 where diuretic was changed to torsemide .   She presents today for a follow-up visit with a chief complaint of shortness of breath. Has associated fatigue. Sleeping well on 4 pillows. Wearing CPAP nightly. Has not taken meds this morning and says that she occasionally doesn't take her  medications when she's feeling bad.   ROS: All systems negative except as listed in HPI, PMH and Problem List.  SH:  Social History   Socioeconomic History   Marital status: Legally Separated    Spouse name: Not on file   Number of children: Not on file   Years of education: Not on file   Highest education level: Not on file  Occupational History   Not on file  Tobacco Use   Smoking status: Former    Current packs/day: 0.00    Types: Cigarettes    Quit date: 01/26/2016    Years since quitting: 7.6   Smokeless tobacco: Never  Vaping Use   Vaping status: Never Used  Substance and Sexual Activity   Alcohol use: Yes    Comment: Occassionally   Drug use: No   Sexual activity: Never  Other Topics Concern   Not on file  Social History Narrative   Not on file   Social Drivers of Health   Financial Resource Strain: Not on file  Food Insecurity: No Food Insecurity (01/11/2022)   Hunger Vital Sign    Worried About Running Out of Food in the Last Year: Never true    Ran Out of Food in the Last Year: Never true  Transportation Needs: No Transportation Needs (01/11/2022)   PRAPARE - Administrator, Civil Service (Medical): No    Lack of Transportation (Non-Medical): No  Physical Activity: Not on file  Stress: Not on file  Social Connections: Not on file  Intimate Partner Violence: Not At Risk (  01/11/2022)   Humiliation, Afraid, Rape, and Kick questionnaire    Fear of Current or Ex-Partner: No    Emotionally Abused: No    Physically Abused: No    Sexually Abused: No    FH:  Family History  Problem Relation Age of Onset   Prostate cancer Father    Rectal cancer Sister    Breast cancer Sister    Cancer Maternal Grandmother    Cancer Maternal Grandfather     Past Medical History:  Diagnosis Date   A-fib [facility]) 2010   Adrenal adenoma, left 10/06/2004   a.) CT 10/06/2004 --> measured 3.1 x 1.6 cm   Alcohol abuse    Allergy    Anginal pain (HCC)    Anxiety     Cardiac murmur    CHF (congestive heart failure) (HCC)    COPD (chronic obstructive pulmonary disease) (HCC)    Diverticulosis    Fatty liver    GERD (gastroesophageal reflux disease)    Gout    History of cocaine abuse (HCC)    a.) reports that use discontinued following CVA in 2010.   History of kidney stones    Hypertension    Mild cognitive impairment    Non-healing non-surgical wound    right leg   Obesity    On supplemental oxygen therapy    a.) 2 L/[place]   OSA on CPAP    Pulmonary embolus (HCC) 02/19/2012   a.) CTA --> RIGHT upper lobe.   Stroke [facility]) 2010   Thoracic ascending aortic aneurysm (HCC)    a.) CT 10/28/2019 --> measured 4.6 cm.   Thyroid disease    Tricuspid valvular regurgitation    a.) TTE 03/26/2018 --> mild to moderate.    Current Outpatient Medications  Medication Sig Dispense Refill   allopurinol  (ZYLOPRIM ) 300 MG tablet Take 1 tablet (300 mg total) by mouth daily. 90 tablet 1   amLODipine  (NORVASC ) 10 MG tablet TAKE 1 TABLET BY MOUTH EVERY DAY 90 tablet 0   aspirin  EC 81 MG tablet Take 1 tablet (81 mg total) by mouth daily. 30 tablet 1   atorvastatin  (LIPITOR) 40 MG tablet TAKE 1 TABLET BY MOUTH EVERY DAY 90 tablet 0   Cholecalciferol  (VITAMIN D3) 25 MCG (1000 UT) CAPS Take 1 capsule (1,000 Units total) by mouth daily. 90 capsule 1   citalopram  (CELEXA ) 10 MG tablet TAKE 1 TABLET BY MOUTH EVERY DAY 90 tablet 0   cloNIDine  (CATAPRES ) 0.1 MG tablet TAKE 1 TABLET BY MOUTH EVERY DAY 90 tablet 0   colchicine  0.6 MG tablet Take 1 tablet (0.6 mg total) by mouth daily. 90 tablet 0   empagliflozin  (JARDIANCE ) 10 MG TABS tablet Take 1 tablet (10 mg total) by mouth daily before breakfast. 90 tablet 3   gabapentin  (NEURONTIN ) 300 MG capsule TAKE 2 CAPSULES (600 MG TOTAL) BY MOUTH AT BEDTIME. 180 capsule 0   hydrALAZINE  (APRESOLINE ) 100 MG tablet TAKE 1 TABLET BY MOUTH 3 TIMES DAILY. 270 tablet 0   lisinopril  (ZESTRIL ) 40 MG tablet Take 1 tablet (40 mg total) by  mouth daily. 90 tablet 0   metoprolol  tartrate (LOPRESSOR ) 25 MG tablet Take 1 tablet (25 mg total) by mouth 2 (two) times daily. 60 tablet 0   ondansetron  (ZOFRAN -ODT) 4 MG disintegrating tablet Take 1 tablet (4 mg total) by mouth every 6 (six) hours as needed for nausea or vomiting. 20 tablet 0   pantoprazole  (PROTONIX ) 40 MG tablet TAKE 1 TABLET BY MOUTH EVERY DAY 90  tablet 3   potassium chloride  SA (KLOR-CON  M) 20 MEQ tablet Take 3 tablets (60 mEq total) by mouth daily.     spironolactone  (ALDACTONE ) 25 MG tablet Take 1 tablet (25 mg total) by mouth daily. 90 tablet 3   Tiotropium Bromide -Olodaterol 2.5-2.5 MCG/ACT AERS Inhale 2 puffs into the lungs daily. 4 g 3   torsemide  (DEMADEX ) 20 MG tablet Take 2 tablets (40 mg total) by mouth daily. 180 tablet 1   VENTOLIN  HFA 108 (90 Base) MCG/ACT inhaler TAKE 2 PUFFS BY MOUTH EVERY 6 HOURS AS NEEDED FOR WHEEZE OR SHORTNESS OF BREATH 18 each 2   No current facility-administered medications for this visit.   There were no vitals filed for this visit.  Wt Readings from Last 3 Encounters:  08/05/23 228 lb (103.4 kg)  07/14/23 223 lb (101.2 kg)  07/01/23 229 lb (103.9 kg)   Lab Results  Component Value Date   CREATININE 0.71 08/05/2023   CREATININE 0.71 07/14/2023   CREATININE 0.68 07/01/2023   PHYSICAL EXAM:  General: Well appearing. No resp difficulty HEENT: normal Neck: supple, no JVD Cor: Regular rhythm, rate. No rubs, gallops or murmurs Lungs: clear Abdomen: soft, nontender, nondistended. Extremities: no cyanosis, clubbing, rash, edema Neuro: alert & oriented X 3. Moves all 4 extremities w/o difficulty. Affect pleasant   ECG: not done   ASSESSMENT & PLAN:  1: NICM with preserved ejection fraction- - likely due to HTN/ OSA as cath in 2020 was without CAD - NYHA Randall II - euvolemic - weight stable from last visit here 1 month ago; she says that by home weight, she has declined - Echo 06/17/16: EF of 55-65%. - Echo 03/26/2018:  EF of 60-65% along with mild/ moderate TR.   - Echo 09/21/22: EF 55-60% with moderate LVH, Grade II DD and mild dilatation of the aortic root, measuring 44 mm. There is mild dilatation of the ascending aorta, measuring 41 mm. There is mild dilatation of the aortic arch, measuring 39 mm.  - Catheterization 03/27/2018: normal coronary arteries and normal left ventricular function. - not adding salt to her food. Reminded to closely follow a 2000mg  sodium diet  - saw cardiology Melva) 02/22; [facility] cardiology referral made at last visit 06/25 - continue lisinopril  40mg  daily - continue metoprolol  tartrate 25mg  BID - continue torsemide  40mg  daily  - continue potassium 60meq daily - continue spironolactone  25mg  daily - begin jardiance  10mg  daily; BMET today and repeat at next visit - BNP 05/19/23 was 44.4  2: HTN- - BP 155/103; has not taken meds yet this morning and admits that she occasionally doesn't take them at all - saw PCP War[facility]) 06/25 - continue amlodipine  10mg  daily - continue clonidine  0.1mg  daily - continue hydralazine  100mg  TID - continue lisinopril  40mg  daily - BMP 07/14/23 reviewed: sodium 147, potassium 3.2, creatinine 0.71 and GFR 99 - BMP today  3: Obstructive sleep apnea- - reports sleeping well - wearing CPAP nightly - to see pulmonology 08/25  4: Depression/ anxiety- - continues to struggle with depression/ anxiety but overall says that she's feeling well  5: Thoracic aneurysm- - stable per CT's - has been followed by [facility] cardiology; referral placed last visit - Echo 09/21/22: EF 55-60% with moderate LVH, Grade II DD and mild dilatation of the aortic root, measuring 44 mm. There is mild dilatation of the ascending aorta, measuring 41 mm. There is mild dilatation of the aortic arch, measuring 39 mm.  - chest CTA 01/11/22: Ascending thoracic aorta  is mildly dilated measuring 4.3 cm  6: Hyperlipidemia- - continue atorvastatin  40mg  daily; needs to  be consistently taking this - LDL 07/14/23 was 109 - discussed increasing atorvastatin  but she declines as she'd like to try consistently taking it - recheck lipids in 3-4 months   Return in 1 month, sooner if needed. Encouraged consistent medication compliance.   Betty DELENA Class, FNP 09/08/23

## 2023-09-08 NOTE — Telephone Encounter (Signed)
 Called to confirm/remind patient of their appointment at the [facility] Failure Clinic on 09/09/23.   Appointment:   [x] Confirmed  [] Left mess   [] No answer/No voice mail  [] VM Full/unable to leave message  [] Phone not in service  Patient reminded to bring all medications and/or complete list.  Confirmed patient has transportation. Gave directions, instructed to utilize valet parking.

## 2023-09-09 ENCOUNTER — Telehealth: Payer: Self-pay | Admitting: Family

## 2023-09-09 ENCOUNTER — Encounter: Admitting: Family

## 2023-09-09 NOTE — Telephone Encounter (Signed)
 Patient did not show for her Heart Failure Clinic appointment on 09/09/23.

## 2023-09-12 ENCOUNTER — Telehealth: Payer: Self-pay | Admitting: Family

## 2023-09-12 NOTE — Telephone Encounter (Signed)
 Called to confirm/remind patient of their appointment at the [facility] Failure Clinic on 09/13/23.   Appointment:   [x] Confirmed  [] Left mess   [] No answer/No voice mail  [] VM Full/unable to leave message  [] Phone not in service  Patient reminded to bring all medications and/or complete list.  Confirmed patient has transportation. Gave directions, instructed to utilize valet parking.

## 2023-09-13 ENCOUNTER — Encounter: Admitting: Family

## 2023-09-13 ENCOUNTER — Telehealth: Payer: Self-pay | Admitting: Family

## 2023-09-13 NOTE — Telephone Encounter (Signed)
 Patient did not show for her Heart Failure Clinic appointment on 09/13/23. This is the 10th appointment she has missed.

## 2023-09-13 NOTE — Progress Notes (Deleted)
 Advanced Heart Failure Clinic Note    PCP: Melvin Pao, NP  Primary Cardiologist: Florencio Kava, MD (last seen 02/22)  Chief Complaint:    HPI:  [name] is a 58 y/o female with a history of pulmonary embolus, obstructive sleep apnea, atrial fibrillation, COPD, gout, HTN, CVA, hyperlipidemia, depression, anxiety, OSA, remote tobacco use and chronic heart failure.   Had a pharmacologic stress trest done 09/12/12 and had an estimated EF of 49%.   Echo 06/17/16: EF of 55-65%. Echo 03/26/2018: EF of 60-65% along with mild/ moderate TR.    Catheterization 03/27/2018:  normal coronary arteries and normal left ventricular function.  Admitted 01/11/22 due to productive cough with greenish colored sputum, shortness of breath, fever, chills, malaise, sore throat, body aches. Denies chest pain. Patient also reports nausea, vomiting, diarrhea and abdominal pain. GI pathogen panel negative but C. difficile antigen positive with negative toxin with reflex to PCR which came back positive. Influenza A PCR positive.   Echo 09/21/22: EF 55-60% with moderate LVH, Grade II DD and mild dilatation of the aortic root, measuring 44 mm. There is mild dilatation of the ascending aorta, measuring 41 mm. There is mild dilatation of the aortic arch, measuring 39 mm.   Was in the ED 11/11/22 with flulike symptoms for 1-2 days. CXR negative. Potassium corrected. Elevated BP thought to be due to vomiting.   Was in the ED 05/19/23 with chest pain & vomiting after taking meds. Found to be in AF RVR. CXR negative, labs normal and EKG without evidence of STEMI. Pain improved and she was released.    Seen in [facility] 06/25 where diuretic was changed to torsemide .   Seen in [facility] 07/25 where jardiance  10mg  daily was started.   She presents today for a follow-up visit with a chief complaint of   ROS: All systems negative except as listed in HPI, PMH and Problem List.  SH:  Social History   Socioeconomic History    Marital status: Legally Separated    Spouse name: Not on file   Number of children: Not on file   Years of education: Not on file   Highest education level: Not on file  Occupational History   Not on file  Tobacco Use   Smoking status: Former    Current packs/day: 0.00    Types: Cigarettes    Quit date: 01/26/2016    Years since quitting: 7.6   Smokeless tobacco: Never  Vaping Use   Vaping status: Never Used  Substance and Sexual Activity   Alcohol use: Yes    Comment: Occassionally   Drug use: No   Sexual activity: Never  Other Topics Concern   Not on file  Social History Narrative   Not on file   Social Drivers of Health   Financial Resource Strain: Not on file  Food Insecurity: No Food Insecurity (01/11/2022)   Hunger Vital Sign    Worried About Running Out of Food in the Last Year: Never true    Ran Out of Food in the Last Year: Never true  Transportation Needs: No Transportation Needs (01/11/2022)   PRAPARE - Administrator, Civil Service (Medical): No    Lack of Transportation (Non-Medical): No  Physical Activity: Not on file  Stress: Not on file  Social Connections: Not on file  Intimate Partner Violence: Not At Risk (01/11/2022)   Humiliation, Afraid, Rape, and Kick questionnaire    Fear of Current or Ex-Partner: No    Emotionally Abused:  No    Physically Abused: No    Sexually Abused: No    FH:  Family History  Problem Relation Age of Onset   Prostate cancer Father    Rectal cancer Sister    Breast cancer Sister    Cancer Maternal Grandmother    Cancer Maternal Grandfather     Past Medical History:  Diagnosis Date   A-fib [facility]) 2010   Adrenal adenoma, left 10/06/2004   a.) CT 10/06/2004 --> measured 3.1 x 1.6 cm   Alcohol abuse    Allergy    Anginal pain (HCC)    Anxiety    Cardiac murmur    CHF (congestive heart failure) (HCC)    COPD (chronic obstructive pulmonary disease) (HCC)    Diverticulosis    Fatty liver    GERD  (gastroesophageal reflux disease)    Gout    History of cocaine abuse (HCC)    a.) reports that use discontinued following CVA in 2010.   History of kidney stones    Hypertension    Mild cognitive impairment    Non-healing non-surgical wound    right leg   Obesity    On supplemental oxygen therapy    a.) 2 L/[place]   OSA on CPAP    Pulmonary embolus (HCC) 02/19/2012   a.) CTA --> RIGHT upper lobe.   Stroke [facility]) 2010   Thoracic ascending aortic aneurysm (HCC)    a.) CT 10/28/2019 --> measured 4.6 cm.   Thyroid disease    Tricuspid valvular regurgitation    a.) TTE 03/26/2018 --> mild to moderate.    Current Outpatient Medications  Medication Sig Dispense Refill   allopurinol  (ZYLOPRIM ) 300 MG tablet Take 1 tablet (300 mg total) by mouth daily. 90 tablet 1   amLODipine  (NORVASC ) 10 MG tablet TAKE 1 TABLET BY MOUTH EVERY DAY 90 tablet 0   aspirin  EC 81 MG tablet Take 1 tablet (81 mg total) by mouth daily. 30 tablet 1   atorvastatin  (LIPITOR) 40 MG tablet TAKE 1 TABLET BY MOUTH EVERY DAY 90 tablet 0   Cholecalciferol  (VITAMIN D3) 25 MCG (1000 UT) CAPS Take 1 capsule (1,000 Units total) by mouth daily. 90 capsule 1   citalopram  (CELEXA ) 10 MG tablet TAKE 1 TABLET BY MOUTH EVERY DAY 90 tablet 0   cloNIDine  (CATAPRES ) 0.1 MG tablet TAKE 1 TABLET BY MOUTH EVERY DAY 90 tablet 0   colchicine  0.6 MG tablet Take 1 tablet (0.6 mg total) by mouth daily. 90 tablet 0   empagliflozin  (JARDIANCE ) 10 MG TABS tablet Take 1 tablet (10 mg total) by mouth daily before breakfast. 90 tablet 3   gabapentin  (NEURONTIN ) 300 MG capsule TAKE 2 CAPSULES (600 MG TOTAL) BY MOUTH AT BEDTIME. 180 capsule 0   hydrALAZINE  (APRESOLINE ) 100 MG tablet TAKE 1 TABLET BY MOUTH 3 TIMES DAILY. 270 tablet 0   lisinopril  (ZESTRIL ) 40 MG tablet Take 1 tablet (40 mg total) by mouth daily. 90 tablet 0   metoprolol  tartrate (LOPRESSOR ) 25 MG tablet Take 1 tablet (25 mg total) by mouth 2 (two) times daily. 60 tablet 0   ondansetron   (ZOFRAN -ODT) 4 MG disintegrating tablet Take 1 tablet (4 mg total) by mouth every 6 (six) hours as needed for nausea or vomiting. 20 tablet 0   pantoprazole  (PROTONIX ) 40 MG tablet TAKE 1 TABLET BY MOUTH EVERY DAY 90 tablet 3   potassium chloride  SA (KLOR-CON  M) 20 MEQ tablet Take 3 tablets (60 mEq total) by mouth daily.  spironolactone  (ALDACTONE ) 25 MG tablet Take 1 tablet (25 mg total) by mouth daily. 90 tablet 3   Tiotropium Bromide -Olodaterol 2.5-2.5 MCG/ACT AERS Inhale 2 puffs into the lungs daily. 4 g 3   torsemide  (DEMADEX ) 20 MG tablet Take 2 tablets (40 mg total) by mouth daily. 180 tablet 1   VENTOLIN  HFA 108 (90 Base) MCG/ACT inhaler TAKE 2 PUFFS BY MOUTH EVERY 6 HOURS AS NEEDED FOR WHEEZE OR SHORTNESS OF BREATH 18 each 2   No current facility-administered medications for this visit.     PHYSICAL EXAM:  General: Well appearing. No resp difficulty HEENT: normal Neck: supple, no JVD Cor: Regular rhythm, rate. No rubs, gallops or murmurs Lungs: clear Abdomen: soft, nontender, nondistended. Extremities: no cyanosis, clubbing, rash, edema Neuro: alert & oriented X 3. Moves all 4 extremities w/o difficulty. Affect pleasant   ECG: not done   ASSESSMENT & PLAN:  1: NICM with preserved ejection fraction- - likely due to HTN/ OSA as cath in 2020 was without CAD - NYHA Randall II - euvolemic - weight 228  from last visit here 1 month ago - Echo 06/17/16: EF of 55-65%. - Echo 03/26/2018: EF of 60-65% along with mild/ moderate TR.   - Echo 09/21/22: EF 55-60% with moderate LVH, Grade II DD and mild dilatation of the aortic root, measuring 44 mm. There is mild dilatation of the ascending aorta, measuring 41 mm. There is mild dilatation of the aortic arch, measuring 39 mm.  - Catheterization 03/27/2018: normal coronary arteries and normal left ventricular function. - not adding salt to her food. Reminded to closely follow a 2000mg  sodium diet  - saw cardiology Melva) 02/22; [facility] cardiology referral made at last visit 06/25 - continue lisinopril  40mg  daily - continue metoprolol  tartrate 25mg  BID - continue torsemide  40mg  daily  - continue potassium 60meq daily - continue spironolactone  25mg  daily - continue jardiance  10mg  daily. BMET today - BNP 05/19/23 was 44.4  2: HTN- - BP  - saw PCP [facility]) 06/25 - continue amlodipine  10mg  daily - continue clonidine  0.1mg  daily - continue hydralazine  100mg  TID - continue lisinopril  40mg  daily - BMP 08/05/23 reviewed: sodium 141, potassium 3.4, creatinine 0.71 and GFR 99 - BMP today  3: Obstructive sleep apnea- - reports sleeping well - wearing CPAP nightly - to see pulmonology 08/25  4: Depression/ anxiety- - continues to struggle with depression/ anxiety but overall says that she's feeling well  5: Thoracic aneurysm- - stable per CT's - has been followed by [facility] cardiology; referral placed last visit - Echo 09/21/22: EF 55-60% with moderate LVH, Grade II DD and mild dilatation of the aortic root, measuring 44 mm. There is mild dilatation of the ascending aorta, measuring 41 mm. There is mild dilatation of the aortic arch, measuring 39 mm.  - chest CTA 01/11/22: Ascending thoracic aorta is mildly dilated measuring 4.3 cm  6: Hyperlipidemia- - continue atorvastatin  40mg  daily; needs to be consistently taking this - LDL 07/14/23 was 109 - discussed increasing atorvastatin  but she declines as she'd like to try consistently taking it - recheck lipids in 3-4 months     Betty DELENA Class, FNP 09/13/23

## 2023-09-16 ENCOUNTER — Ambulatory Visit: Admitting: Pulmonary Disease

## 2023-10-02 ENCOUNTER — Other Ambulatory Visit: Payer: Self-pay | Admitting: Nurse Practitioner

## 2023-10-03 NOTE — Telephone Encounter (Signed)
 Requested Prescriptions  Pending Prescriptions Disp Refills   colchicine  0.6 MG tablet [Pharmacy Med Name: COLCHICINE  0.6 MG TABLET] 90 tablet 1    Sig: TAKE 1 TABLET BY MOUTH EVERY DAY     Endocrinology:  Gout Agents - colchicine  Passed - 10/03/2023  3:00 PM      Passed - Cr in normal range and within 360 days    Creatinine  Date Value Ref Range Status  01/15/2014 1.36 (H) 0.60 - 1.30 mg/dL Final   Creatinine, Ser  Date Value Ref Range Status  08/05/2023 0.71 0.44 - 1.00 mg/dL Final         Passed - ALT in normal range and within 360 days    ALT  Date Value Ref Range Status  07/14/2023 8 0 - 32 IU/L Final   SGPT (ALT)  Date Value Ref Range Status  01/14/2014 39 U/L Final    Comment:    14-63 NOTE: New Reference Range 08/14/13          Passed - AST in normal range and within 360 days    AST  Date Value Ref Range Status  07/14/2023 15 0 - 40 IU/L Final   SGOT(AST)  Date Value Ref Range Status  01/14/2014 43 (H) 15 - 37 Unit/L Final         Passed - Valid encounter within last 12 months    Recent Outpatient Visits           2 months ago Chronic diastolic heart failure (HCC)   [facility] Melvin Pao, NP              Passed - CBC within normal limits and completed in the last 12 months    WBC  Date Value Ref Range Status  05/19/2023 8.8 4.0 - 10.5 K/uL Final   RBC  Date Value Ref Range Status  05/19/2023 4.28 3.87 - 5.11 MIL/uL Final   Hemoglobin  Date Value Ref Range Status  05/19/2023 13.7 12.0 - 15.0 g/dL Final  90/77/7976 86.3 11.1 - 15.9 g/dL Final   HCT  Date Value Ref Range Status  05/19/2023 41.4 36.0 - 46.0 % Final   Hematocrit  Date Value Ref Range Status  10/16/2021 42.6 34.0 - 46.6 % Final   MCHC  Date Value Ref Range Status  05/19/2023 33.1 30.0 - 36.0 g/dL Final   [facility]  Date Value Ref Range Status  05/19/2023 32.0 26.0 - 34.0 pg Final   MCV  Date Value Ref Range Status  05/19/2023 96.7 80.0 -  100.0 fL Final  10/16/2021 95 79 - 97 fL Final  01/15/2014 87 80 - 100 fL Final   No results found for: PLTCOUNTKUC, LABPLAT, POCPLA RDW  Date Value Ref Range Status  05/19/2023 13.9 11.5 - 15.5 % Final  10/16/2021 14.0 11.7 - 15.4 % Final  01/15/2014 16.0 (H) 11.5 - 14.5 % Final

## 2023-10-16 NOTE — BH Treatment Plan (Signed)
 [facility] to [facility] Transfer Checklist  Chief Concern Cellulitis of right lower extremity  Brief Clinical Summary:   [name] is a 58 y.o. female w/ medical history of HTN, HLD, COPD (on home 2L), stroke, gout, depression, anxiety, HFpEF (EF 55-60% 08/2022), that presented to [facility] with RLE swelling and pain. Patient presents with 3 weeks of right foot/leg pain and swelling. Initially thought to be gout flare; but symptoms did not improve despite taking colchicine  and naproxen  PRN. Endorses sweats and chills; but no measured fever. States that she is now using a wheelchair to get around, were she was able to ambulate independently one month ago. She presented to [facility] ED where labs were notable for ESR of 34, CRP 22.5, no leukocytosis and normal BMP. XR right foot showed diffuse soft tissue swelling at the dorsal aspect of forefoot. She was transferred to [facility] ED for MRI of RLE to rule out osteomyelitis which showed no acute osteomyelitis of the ankle/hindfoot, cellulitis along the lateral and medial aspects of the midfoot. R ankle XR w/ o acute fracture or acute traumatic malalignment. PVL BLE w/o acute DVT. S/p PO keflex  500 mg, IV cefepime 2g, IV toradol  15 mg, PO tylenol  650 mg and PO oxycodone  5 mg.   Of note, she has not taken any of her scheduled medications for the past month. Patient's blood pressure is well-controlled. Endorses poor appetite due to pain. History of AUD, last drink was in 2009. Has not smoked cigarettes in several years.   Recommended Plan on Transfer  RLE cellulitis  Patient presents with 3 weeks of right foot/leg pain and swelling. No improvement with gout treatment. Green coloration of plantar right foot concerning for possible pseudomonal infection.  - IV Cefepime 2g q8h for empiric treatment - Pain control   COPD - Resume Spiriva  daily formulary equivalent - Albuterol  PRN  HFpEF HTN - Hold clonidine  0.1 mg BID -  Hold metoprolol  100 mg daily - Hold amlodipine  10 mg daily - Hold hydralazine  50 mg TID - Resume spironolactone  25 mg daily - Resume jardiance  10 mg daily  HLD - Resume aspirin  81 mg daily - Resume atorvastatin  20 mg daily  Mood d/o - Resume celexa  10 mg daily  Gout - Continue allopurinol  100 mg daily  GERD - Resume pantoprazole  40 mg daily  OSA - CPAP nightly  Physical Exam:  Temp:  [36.5 C (97.7 F)-37.1 C (98.7 F)] 37.1 C (98.7 F) Pulse:  [98-100] 98 SpO2 Pulse:  [75-102] 75 Resp:  [16-20] 19 BP: (122-151)/(94-115) 122/98 SpO2:  [95 %-99 %] 99 %   Gen: NAD, converses   HENT: atraumatic, normocephalic Heart: RRR, no mrg Lungs: CTAB, no crackles or wheezes  Abdomen: soft, NTND  Extremities: No edema, erythema  I was personally able to examine the patient: Yes.   Checklist:  1.   Does the patient/HCDM agree to [facility] transfer: Yes  2.   Is the patient stable for transfer? Yes Acute Care (Floor)   3.   The patient requires the following procedures or consult services not available at [facility]:   None  4.   Has the patient's home medication list been reviewed? Yes  5.   Does the patient need/take any of the following medications? Consider reordering if medically appropriate. No  6.   Admission order has been placed: Yes   7.   Warm Hand-off completed prior to transfer: Yes  8.   Has the patient been added  to the MCAT list?: Yes   Given the above information, patient is appropriate for transfer to [facility] at this time: Yes   Code Status: Prior  HCDM: [name] currently has decisional capacity for healthcare decision-making and is able to designate a surrogate healthcare decision maker. [name] designated healthcare decision maker(s) is/are [name] (the patient's adult sibling) as denoted by stated patient preference.   Rosina Shuck, MD PGY-4 Internal Medicine & Pediatrics

## 2023-10-16 NOTE — H&P (Signed)
 ------------------------------------------------------------------------------- Attestation signed by Janjua, Ejaz Arshad, DO at 10/16/23 1655 I saw and evaluated the patient, participating in the key portions of the service.  I reviewed the resident's note.  I agree with the resident's findings and plan.   Given lack of rise in inflammatory markers, normal wbc, and chronicity of symptoms, clinically not presenting as cellulitis.  Unclear what the green discoloration on the plantar aspect of her right foot represents but suspect it could be a postinflammatory ecchymotic change from possibly tendinosis seen on imaging.  Pulses intact so less likely ischemic in origin.  Not having difficulty with joint pain and is not very tender to light palpation so less likely a gout flare clinically. Potential to go home tomorrow and maybe an outpatient visit to orthopedic surgery would be warranted.   I personally spent 33 minutes face-to-face and non-face-to-face in the care of this patient, which includes all pre, intra, and post visit time on the date of service.  All documented time was specific to the E/M visit and does not include any procedures that may have been performed.   -------------------------------------------------------------------------------  Internal Medicine (MEDM) History & Physical  Assessment & Plan:  [name] is a 58 y.o. female who is presenting to [facility] with Cellulitis of right lower extremity, in the setting of the following pertinent/contributing co-morbidities: COPD (on 2L at home), HFpEF (EF 55-60% echo 08/2022), Hypertension, gout, GERD, OSA, Hx of DVT .  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 XR right foot showed diffuse soft tissue swelling at the dorsal aspect of forefoot. She was transferred to [facility] ED for MRI of RLE to rule out osteomyelitis which showed no acute osteomyelitis of the ankle/hindfoot, cellulitis along the lateral and medial aspects of the midfoot. R ankle XR w/ o acute fracture or acute traumatic malalignment. PVL BLE w/o acute DVT. S/p PO keflex  500 mg, IV cefepime 2g, IV toradol  15 mg, PO tylenol  650 mg and PO oxycodone  5 mg. Cefepime was started due to green discoloration of the limb concerning for Pseudomonas. Discoloration of limb could also be concerning for ischemic etiology, though less likely as she had palpable DP pulses bilaterally.  - Continue Cefepime 2g q8h - Pain control: Scheduled Tylenol , IV Toradol , and PO Oxycodone    Chronic Problems  COPD - Resume Spiriva  daily formulary equivalent - Albuterol  PRN  HFpEF iso HTN - Hold clonidine  0.1 mg BID - Hold metoprolol  100 mg daily - Hold amlodipine  10 mg daily - Hold hydralazine  50 mg TID - Hold lisinopril   - Resume spironolactone  25 mg daily - Resume jardiance  10 mg daily  HLD - Resume aspirin  81 mg daily - Resume atorvastatin  20 mg daily  Mood d/o - Resume celexa  10 mg daily   Gout - Continue allopurinol  100 mg daily   GERD - Resume pantoprazole  40 mg daily  OSA - CPAP nightly    Issues Impacting Complexity of Management: <redacted file path> -The patient is at high risk of complications from cellulitis   Checklist: Diet: Regular Diet DVT PPx: Lovenox  40mg  q24h Code Status: Full Code Dispo: Patient appropriate for Inpatient based on expectation of ongoing need  for hospitalization greater than two midnights based on severity of presentation/services including IV antibiotics  Team Contact Information:  Primary Team: Internal Medicine (MEDM) Primary Resident: Kaitlyn R Ancell, MD Resident's Pager: [phone] MedM Intern)  Chief Concern:  Cellulitis of right lower extremity  Subjective:  [name] is a 58 y.o. female with pertinent PMHx of COPD (on 2L at home), HFpEF (EF 55-60% echo 08/2022), Hypertension, gout, GERD, OSA. presenting with Cellulitis of her R foot.   History obtained by patient   HPI: Presented with 3 weeks of right foot/leg pain and swelling. This has been accompanied by sweating and chills, but no documented fever. She also has been experiencing decreased appetite and loss of taste/smell which has led to decreased PO intake. She has been taking Tylenol  and Naproxen  for pain without significant relief. She has not been taking her other medications for about a month due to malaise and low appetite. She has been using a wheelchair to ambulate, was able to walk independently prior to this foot pain. She states when she tries to walk her whole R leg is painful. She denies any new exposures, foot trauma, or falls. Of note, she states her foot was itchy and she was scratching it and submersing her feet in a foot bath with epsom salts for a prolonged time.   Pertinent Surgical Hx No recent surgeries   Pertinent Family Hx No pertinent family history   Pertinent Social Hx  Denies current tobacco, alcohol use,   Allergies Morphine and Penicillins  I reviewed the Medication List. The current list is Accurate Prior to Admission medications  Medication Dose, Route, Frequency  albuterol  (PROVENTIL  HFA) 90 mcg/actuation inhaler 2 puffs, Inhalation, Every 4 hours PRN  allopurinol  (ZYLOPRIM ) 100 MG tablet 100 mg  amLODIPine  (NORVASC ) 10 MG tablet 10 mg, Oral, Daily (standard)  aspirin  (ASPIR-81) 81 MG tablet 81 mg, Oral, Daily  (standard)  atorvastatin  (LIPITOR) 20 MG tablet 20 mg, Oral, Daily (standard)  citalopram  (CELEXA ) 10 MG tablet 10 mg, Daily (standard)  cloNIDine  HCl (CATAPRES ) 0.1 MG tablet 0.1 mg, Oral, 2 times a day (standard)  colchicine  (COLCRYS ) 0.6 mg tablet 0.6 mg  empagliflozin  (JARDIANCE ) 10 mg tablet 10 mg, Daily (standard)  hydrALAZINE  (APRESOLINE ) 50 MG tablet 50 mg, Oral, 3 times a day (standard)  metoprolol  succinate (TOPROL -XL) 100 MG 24 hr tablet 100 mg, Oral, Daily (standard)  pantoprazole  (PROTONIX ) 40 MG tablet 40 mg  potassium chloride  SA (K-DUR,KLOR-CON ) 20 MEQ tablet 20 mEq, Oral, Daily (standard)  spironolactone  (ALDACTONE ) 25 MG tablet 25 mg, Oral, Daily (standard)  tiotropium (SPIRIVA  WITH HANDIHALER) 18 mcg inhalation capsule 18 mcg, Inhalation, Daily (RT)  lisinopril  (PRINIVIL ,ZESTRIL ) 40 MG tablet 40 mg, Oral, Daily (standard)  MISCELLANEOUS MEDICAL SUPPLY MISC Frequency:PHARMDIR   Dosage:0.0     Instructions:  Note:CPAP 13 cm H20 with heated humidity. Mask: ResMed Mirage Quattro, small Dose: 13 CM H20  miscellaneous medical supply Misc Compression stockings, waist high or upper thighs, 20-14mm Hg.  Dx: chronic LE edema and CHF  oxyCODONE  (ROXICODONE ) 5 MG immediate release tablet 5 mg, Oral, Every 6 hours PRN    Designated Healthcare Decision Maker: Ms. Surprenant currently has decisional capacity for healthcare  decision-making and is able to designate a surrogate healthcare decision maker. Ms. Dowson designated healthcare decision maker(s) is/are Jerelene Bucco (the patient's adult sibling) as denoted by stated patient preference.  Objective:  Physical Exam: Temp:  [36.5 C (97.7 F)-37.1 C (98.7 F)] 37 C (98.6 F) Pulse:  [83-98] 83 SpO2 Pulse:  [70-102] 70 Resp:  [16-20] 19 BP: (112-152)/(83-115) 152/103 SpO2:  [96 %-100 %] 100 %  Gen: NAD, converses  Eyes: Sclera anicteric, EOMI grossly normal  HENT: Atraumatic, normocephalic Neck: Trachea midline Heart:  RRR Lungs: CTAB, no crackles or wheezes Abdomen: Soft, NTND Extremities: R foot bright green on examination, tender to palpation, warm, without effusion.  Neuro: Grossly symmetric, non-focal   Skin:  No rashes, lesions on clothed exam Psych: Alert, oriented  Carolyn GORMAN Deed, MD PGY1 Internal Medicine, [facility]

## 2023-10-17 ENCOUNTER — Telehealth: Payer: Self-pay

## 2023-10-17 ENCOUNTER — Ambulatory Visit: Admitting: Nurse Practitioner

## 2023-10-17 NOTE — Progress Notes (Deleted)
 There were no vitals taken for this visit.   Subjective:    Patient ID: [name] Ill, female    DOB: [birth year], 58 y.o.   MRN: [id number]  HPI: [name] is a 58 y.o. female  No chief complaint on file.  OXYGEN Patient states she has not made an appt with Pulmonology.  Not on portable oxygen.  Breathing well in office today.  Reports that oxygen at home runs around 95%.  HYPERTENSION / HYPERLIPIDEMIA Patient states she does not weigh daily.   Has not seen cardiology since last visit.  Recently saw HF clinic who placed a referral for Cardiology.  She has not started the Torsemide  due insurance issue.  Patient was told the PCP office would figure out how to get it approved for her.    ECHO in August 2024-EF of 55-60%.  Satisfied with current treatment? yes Duration of hypertension: years BP monitoring frequency: not checking BP range: 138/80 BP medication side effects: no Past BP meds: hydralazine  clonidine , amlodipine , and lisinopril  Duration of hyperlipidemia: years Cholesterol medication side effects: no Cholesterol supplements: none Past cholesterol medications: atorvastain (lipitor),  Medication compliance: excellent compliance Aspirin : yes Recent stressors: no Recurrent headaches: no Visual changes: no Palpitations: no Dyspnea: yes Chest pain: no Lower extremity edema: no Dizzy/lightheaded: no  COPD Not using Spiriva  consistently. Does not see Pulmonology.  Has not made an appointment from previous referrals. COPD status: improved Satisfied with current treatment?: no Oxygen use: no - would like oxygen. She feels like she would benefit from oxygen to help her increase her ability to walk further Dyspnea frequency: daily Cough frequency: occasionally Rescue inhaler frequency:  a couple times a week Limitation of activity: yes Productive cough:  Last Spirometry:  Pneumovax: Up to Date Influenza: Not up to Date    Relevant past medical, surgical,  family and social history reviewed and updated as indicated. Interim medical history since our last visit reviewed. Allergies and medications reviewed and updated.  Review of Systems  Eyes:  Negative for visual disturbance.  Respiratory:  Positive for shortness of breath. Negative for cough and chest tightness.   Cardiovascular:  Negative for chest pain, palpitations and leg swelling.  Neurological:  Negative for dizziness and headaches.  Psychiatric/Behavioral:  Positive for dysphoric mood. Negative for suicidal ideas. The patient is nervous/anxious.     Per HPI unless specifically indicated above     Objective:    There were no vitals taken for this visit.  Wt Readings from Last 3 Encounters:  08/05/23 228 lb (103.4 kg)  07/14/23 223 lb (101.2 kg)  07/01/23 229 lb (103.9 kg)    Physical Exam Vitals and nursing note reviewed.  Constitutional:      General: She is not in acute distress.    Appearance: Normal appearance. She is obese. She is not ill-appearing, toxic-appearing or diaphoretic.  HENT:     Head: Normocephalic.     Right Ear: External ear normal.     Left Ear: External ear normal.     Nose: Nose normal.     Mouth/Throat:     Mouth: Mucous membranes are moist.     Pharynx: Oropharynx is clear.  Eyes:     General:        Right eye: No discharge.        Left eye: No discharge.     Extraocular Movements: Extraocular movements intact.     Conjunctiva/sclera: Conjunctivae normal.     Pupils: Pupils are equal, round,  and reactive to light.  Cardiovascular:     Rate and Rhythm: Normal rate. Rhythm irregular.     Heart sounds: No murmur heard. Pulmonary:     Effort: Pulmonary effort is normal. No respiratory distress.     Breath sounds: Normal breath sounds. No wheezing or rales.  Musculoskeletal:     Cervical back: Normal range of motion and neck supple.     Right lower leg: No edema.     Left lower leg: No edema.  Skin:    General: Skin is warm and dry.      Capillary Refill: Capillary refill takes less than 2 seconds.  Neurological:     General: No focal deficit present.     Mental Status: She is alert and oriented to person, place, and time. Mental status is at baseline.  Psychiatric:        Mood and Affect: Mood normal.        Behavior: Behavior normal.        Thought Content: Thought content normal.        Judgment: Judgment normal.     Results for orders placed or performed in visit on 08/05/23  Basic metabolic panel with GFR   Collection Time: 08/05/23 11:14 AM  Result Value Ref Range   Sodium 141 135 - 145 mmol/L   Potassium 3.4 (L) 3.5 - 5.1 mmol/L   Chloride 103 98 - 111 mmol/L   CO2 27 22 - 32 mmol/L   Glucose, Bld 106 (H) 70 - 99 mg/dL   BUN 16 6 - 20 mg/dL   Creatinine, Ser 9.28 0.44 - 1.00 mg/dL   Calcium  9.1 8.9 - 10.3 mg/dL   GFR, Estimated >39 >39 mL/min   Anion gap 11 5 - 15      Assessment & Plan:   Problem List Items Addressed This Visit   None      Follow up plan: No follow-ups on file.

## 2023-10-17 NOTE — Consults (Addendum)
 ORTHOPAEDIC CONSULT - Primary Service for this Patient: [facility] ([facility]). Patient is seen in consultation at the request of [facility] for evaluation of the following:   ASSESSMENT AND PLAN: [name] is a 58 y.o. female with a history of COPD (on 2L at home), HFpEF (EF 55-60% echo 08/2022), Hypertension, gout, GERD, OSA, Hx of DVT seen in consultation at the request of Davied Lager, MD for the evaluation of the following:   1) Rule out septic right ankle: Immunosuppressive factors include: No history of DM, RA, cirrhosis, HIV, recent bacteremia, endocarditis, and IV drug use.  Afebrile, ESR 34, CRP 22.5, WBC 6.3 No pain with short arc ROM of right ankle.  Joint aspiration was deferred at this time given low clinical suspicion for a septic joint.  Operative intervention is not anticipated. Recommend continue broad empiric IV antibiotic therapy per primary and/or ID.  XR and MRI not concerning for acute fracture, osteomyelitis, or septic joint. Finding consistent with chronic gout.   Weight Bearing Status/Activity: weightbearing as tolerated on the right lower extremity. Clinical picture consistent with acute gout flare and superficial cellulitis.   Recommend other infectious work-up including pulmonary, renal, and abdominal sources (UTI, pyelonephritis, constipation, pneumonia, upper respiratory infection, etc.)  - Recommended Additional Labs: No new labs needed. - Pain control: per primary service. - Best contact number: [phone] (home)   - Follow-up plan: Follow up with PCP as needed.   Pre-Operative Orthopaedic Risk Assessment:  Smoking: former Home Oxygen Requirement: On 2 L of home oxygen Height:       10/16/23 177.8 cm (5' 10)   Weight:        10/17/23 97.9 kg (215 lb 12.8 oz)   Hgb A1c: No history of diabetes mellitus. Bleeding and home anticoagulation meds: Denies history of coagulopathy and home anticoagulation.   This patient discussed with senior on  call resident, consult will be staffed with on-call attending.  * Please contact the resident who leaves daily progress notes for any questions while patient is inpatient during weekdays. * Please page orthopaedic consult pager [phone]) on nights (after 5PM) and weekends.  PROCEDURE(S)   None  SUBJECTIVE   Chief Complaint:  Right lower extremity pain and swelling.    History of Present Illness:   [name] is a 58 y.o. female, disabled, with relevant PMH as below who presents for evaluation of  right lower extremity and ankle pain. Patient reports onset 3 weeks to several months ago. Denies acute injury. History of gout but only one flare in LLE. She had colchicine  but did not take it. Denies fevers at home and has be afebrile during admission. Denied prior infections. Denied numbness or tingling in the effected extremity. Denied additional injury. Baseline, ambulates with a walker but has been using wheelchair due to pain. She denies any new exposures, foot trauma, or falls. Of note, she states her foot was itchy and she was scratching it and submersing her feet in a foot bath with epsom salts for a prolonged time.   Per Chart review: XR right foot showed diffuse soft tissue swelling at the dorsal aspect of forefoot. She was transferred to [facility] ED for MRI of RLE to rule out osteomyelitis which showed no acute osteomyelitis of the ankle/hindfoot, cellulitis along the lateral and medial aspects of the midfoot. R ankle XR w/ o acute fracture or acute traumatic malalignment. PVL BLE w/o acute DVT. S/p PO keflex  500 mg, IV cefepime 2g, IV toradol  15 mg,  PO tylenol  650 mg and PO oxycodone  5 mg. Cefepime was started due to green discoloration of the limb concerning for Pseudomonas.    Medical History  Past Medical History[1]  Surgical History  Past Surgical History[2]  Medications  Current Medications[3]  Allergies  Morphine and Penicillins   Social History   Employment: unobtainable  or unknown. Family/friend support: No family/friends present at time of consult..  Tobacco use: Tobacco Use History[4].     Family History  No family history of anesthesia problems. Family History[5]    Review of 10 Systems      Symptoms in the past week? 1) Musculoskeletal per HPI 2) Fevers (Constitutional)  No.    3) Chest pain (CV)              No.     4) Eye Pain   (Eyes)              No.       5) Anxiety (Psych)               No.     6) Wheezing (Resp)   No.     7) Nausea (GI)                No.      8) Sore throat (ENT)                         No.      9) Rash (Skin)            No.    10) Numbness (Neuro)                     No.         OBJECTIVE   PHYSICAL EXAM  Constitutional  Vitals Estimated body mass index is 30.96 kg/m as calculated from the following:   Height as of this encounter: 177.8 cm (5' 10).   Weight as of this encounter: 97.9 kg (215 lb 12.8 oz).  Vitals:   10/17/23 0730  BP: 134/90  Pulse: 77  Resp: 17  Temp: 36.6 C (97.9 F)  SpO2: 100%     General appearance well-nourished, no acute distress  Psychiatric Orientation: Based on my interaction with the patient, I determined that she is appropriately oriented. Mood and Affect: alert, cooperative, and pleasant  Respiratory Respiratory effort is not labored, without evidence of pain or SOB.  Genitourinary not applicable.  Hematologic /lymphatic trauma: normal bruising or hematoma, consistent with level of trauma.  Cardiovascular See vascular (pulses) examination under extremities.  Neurologic See specific peripheral nerve exam under extremities.  Skin See lacerations or skin injuries under extremities.  Musculoskeletal  Extremities:  RLE: Mild swelling of dorsal foot and lateral ankle without ecchymoses, deformity, or effusion. Skin intact.  No tenting, impending open fracture. TTP to medial and lateral malleolus and dorsal foot. Able to actively ROM at the ankle with minimal pain. +  GS/TA/EHL. SILT in DP/SP/S/S/T distributions.  2-point discrimination deferred.  2+ DP pulse with warm and well perfused toes. Ligamentous exam stable. Compartments soft and compressible, with no pain on passive stretch.    LLE: No swelling, ecchymoses, deformity, or effusion. Skin intact. No tenting, impending open fracture.Nontender to palpation, with full and painless ROM throughout. + GS/TA/EHL. SILT in DP/SP/S/S/T distributions. 2-point discrimination deferred. 2+ DP pulse with warm and well perfused toes. Ligamentous exam stable. Compartments soft and compressible, with no pain on  passive stretch.      Test Results Imaging Radiology studies were personally reviewed.  Right foot and ankle radiographs without acute fracture or subluxation.  No features of erosive arthritis  MRI RLE:  1.  No acute osteomyelitis.  2.  Cellulitis along the lateral foot.  3.  Background of bland edema.  4.  Evidence of gout.  5.  Mild tenosynovitis of the peroneus longus.    Labs Lab Results  Component Value Date   WBC 6.3 10/17/2023   HGB 11.7 10/17/2023   HCT 35.0 10/17/2023   PLT 262 10/17/2023    No results for input(s): SPECTYPEART, PHART, PCO2ART, PO2ART, HCO3ART, BEART, O2SATART in the last 24 hours.  Lab Results  Component Value Date   NA 143 10/17/2023   K 3.5 10/17/2023   GLU 93 10/17/2023   Lab Results  Component Value Date   PT 11.5 12/09/2015   INR 1.02 12/09/2015   APTT 27.9 03/17/2012   Lab Results  Component Value Date   ESR 34 (H) 10/15/2023   CRP 22.5 (H) 10/15/2023   No results found for: ALB  Comorbidities See primary team documentation, or consult notes from medical or trauma services.  Problem List[6]  Note created by Charlie KANDICE Cary, PA, October 17, 2023 11:30 AM          [1] Past Medical History: Diagnosis Date  . Adrenal adenoma, left (3.3cm) 09/28/2012  . Allergic rhinitis 02/20/2013  . Anxiety   . Asthma (HHS-HCC)   . COPD  (chronic obstructive pulmonary disease)    (CMS-HCC)   . Depression   . Heart murmur   . Hypertension   . Mild cognitive impairment 12/25/2012  . Obstructive sleep apnea 09/28/2012  . Pulmonary embolism, R upper lobe 02/21/2012  . Seasonal allergic rhinitis due to pollen 11/25/2014  . Stroke    (CMS-HCC)   . Visual impairment   [2] Past Surgical History: Procedure Laterality Date  . LEG SURGERY Left   . WRIST SURGERY Bilateral   [3] Current Facility-Administered Medications  Medication Dose Route Frequency Provider Last Rate Last Admin  . acetaminophen  (TYLENOL ) tablet 1,000 mg  1,000 mg Oral Q8H PRN Dusty Jacquline CROME, MD      . albuterol  (PROVENTIL  HFA;VENTOLIN  HFA) 90 mcg/actuation inhaler 2 puff  2 puff Inhalation Q4H PRN Rolan Rosina CROME, MD      . allopurinol  (ZYLOPRIM ) tablet 100 mg  100 mg Oral Daily Rolan Rosina CROME, MD   100 mg at 10/17/23 9075  . aspirin  chewable tablet 81 mg  81 mg Oral Daily Rolan Rosina CROME, MD   81 mg at 10/17/23 9075  . atorvastatin  (LIPITOR) tablet 20 mg  20 mg Oral Daily Rolan Rosina CROME, MD   20 mg at 10/17/23 0924  . cefepime (MAXIPIME) 1 g in sodium chloride  0.9 % (NS) 100 mL IVPB-MBP  1 g Intravenous Q8H Rolan Rosina CROME, MD 200 mL/hr at 10/17/23 1107 1 g at 10/17/23 1107  . citalopram  (CeleXA ) tablet 10 mg  10 mg Oral Daily Rolan Rosina CROME, MD   10 mg at 10/17/23 9075  . empagliflozin  (JARDIANCE ) tablet 10 mg  10 mg Oral Daily Rolan Rosina CROME, MD   10 mg at 10/17/23 0924  . enoxaparin  (LOVENOX ) syringe 40 mg  40 mg Subcutaneous Q24H SCH Dusty Jacquline CROME, MD   40 mg at 10/17/23 0924  . ketorolac  (TORADOL ) injection 15 mg  15 mg Intravenous Q6H PRN Ancell, Kaitlyn R, MD      .  ondansetron  (ZOFRAN ) tablet 4 mg  4 mg Oral Q8H PRN Rhenda Sieving, MD      . oxyCODONE  (ROXICODONE ) immediate release tablet 5 mg  5 mg Oral Q4H PRN Dusty Jacquline CROME, MD   5 mg at 10/16/23 2319   Or  . oxyCODONE  (ROXICODONE ) immediate release tablet 10 mg  10 mg Oral Q4H PRN  Dusty Jacquline CROME, MD      . pantoprazole  (Protonix ) EC tablet 40 mg  40 mg Oral Daily before breakfast Rolan Rosina CROME, MD   40 mg at 10/17/23 9075  . spironolactone  (ALDACTONE ) tablet 25 mg  25 mg Oral Daily Ancell, Kaitlyn R, MD   25 mg at 10/17/23 0924  . umeclidinium (INCRUSE ELLIPTA) 62.5 mcg/actuation inhaler 1 puff  1 puff Inhalation Daily (RT) Rolan Rosina CROME, MD   1 puff at 10/17/23 9040  [4] Social History Tobacco Use  Smoking Status Former  Smokeless Tobacco Never  [5] Family History Problem Relation Age of Onset  . Hyperlipidemia Mother   . Arthritis Father   . Heart disease Father   [6] Patient Active Problem List Diagnosis  . Cerebral artery occlusion with cerebral infarction    (CMS-HCC)  . History of cocaine use  . Disturbance of skin sensation  . Esophageal reflux (RAF-HCC)  . Essential hypertension (RAF-HCC)  . Radius and ulna distal fracture  . Gout  . Chronic lymphocytic thyroiditis  . History of alcohol abuse  . Thromboembolism of deep veins of lower extremity    (CMS-HCC)  . Thyrotoxicosis  . Insomnia  . Normal cardiac stress test  . 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 Obstructive sleep apnea  . Fatty liver  . Pulmonary embolism, R upper lobe  . Adrenal adenoma, left (3.3cm)  . Depression (RAF-HCC)  . Mild cognitive impairment  . COPD (chronic obstructive pulmonary disease)    (CMS-HCC)  . Seasonal allergic rhinitis due to pollen  . Smoking  . Right upper quadrant abdominal pain  . Need for influenza vaccination  . Chest pain on breathing  . Cellulitis of right lower extremity

## 2023-10-17 NOTE — Consults (Signed)
 Division of Infectious Diseases General Inpatient Consultation Service   For questions about this consult, page [phone] ([facility] Follow-up Pager).   [name] is being seen in consultation at the request of Betty Lager, MD for evaluation and management of R foot swelling/pain.[name]    PLAN FOR 10/17/2023  Diagnostic No further diagnostic studies recommended    Treatment STOP cefepime Start cephalexin  (keflex ) 500 mg TID Duration of therapy = 7 d start date =  9/20 end date = 9/27  I discussed the plans for today with primary team and patient on 10/17/2023.  Our service will sign off.  I personally spent 95 mins face-to-face and non-face-to-face in the care of this patient on 10/17/2023, which includes all pre, intra, and post visit time on the date of service.  All documented time was specific to the E/M visit and does not include any procedures that may have been performed.  Care for a suspected or confirmed infection was provided by an ID specialist in this encounter. [phone])   Betty KATHEE Prader, MD [facility] Division of Infectious Diseases         MDM and Problem-Specific Assessments  ( .00ID2DAY  /  .00IDADDLPROB  /  .IDSS  / KAE )   Assessment & Plan  58 year old female with  COPD, HFpEF (EF 55-60% echo 08/2022),   gout,  who presented on 10/15/2023 with a painful, swollen, and red R foot for a few weeks and chills, with imaging that shows edema of dorsal and lateral foot. My guess is that this was a non-purulent cellulitis that has been partially treated. It does not look severe now. Not clear to me if she has/had gout in great toe- it does not look particularly swollen or tender right now.  I do not think the green discoloration on her plantar foot is pseudomonas, which often grows on open wounds. Also, her plantar foot did not seem to be part of her problem, and certainly isn't tender, red, or swollen today. I don't know what caused the green color, but not  infectious.    R foot swelling and erythema Cellulitis with atypical greenness on foot, not typical of cellulitis. Differential includes infection or gout. Condition improving with treatment. - Continue antibiotics, transition from IV to oral as condition improves. - Advise on symptom observation and report any worsening.  Pain in right ankle and joints of right foot Pain initially severe, now decreased with treatment. Associated with cellulitis but greenness not related to pain or infection. - Monitor pain levels and adjust treatment as necessary.  Patient has: [x]  acute illness w/systemic sxs  [mod] []  illness posing risk to life or function  [high]  I reviewed:  (3+) [x]  primary team note [x]  consultant note(s) []  procedure/op note(s) []  micro result(s)   [x]  CBC results [x]  chemistry results []  radiology report(s) []  w/indep. historian  I independently visualized:  (any)   []  cxs/plates in lab [x]  plain film images []  CT images []  PET images   []  path slide(s) []  ECG tracing [x]  MRI images []  nuclear scan  I discussed: (any) []  micro and/or path w/lab personnel []  drug options and/or interactions w/ID pharmD   []  procedure/OR findings w/other MD(s) []  echo and/or imaging w/other MD(s)   []  mgm't w/attending(s) involved in case []  setting up home abx w/OPAT team  Mgm't requires: [x]  prescription drug(s)  [mod] []  intensive toxicity monitoring  [high]    #  R foot swelling and erythema   -  acute, undx'd new problem w/uncertain prognosis  [mod] Partially treated cellulitis?  Stop cefepime Suggest keflex  500 mg TID x 7 d Follow up with PCP  # reported PCN allergy   - chronic, stable  [low / mod] Describes her allergy as vaginal itching- doubt this is a true allergy     # Management of prescription antimicrobials needing intensive toxicity monitoring - acute, poses threat to life or bodily function  [high] Beta-lactams (penicillins, cephalosporins, and carbapenems) can cause  rashes, loose stools, nephrotoxicity, and/or myelosuppression.  See recommendations in blue box above.   # Disposition   Does not need home abx    DATA, FINDINGS, & RESULTS GUIDING DECISION-MAKING FOR PROBLEMS ABOVE  CBC, CRP,  MRI, X ray foot Primary team, ER notes           Antimicrobials & Other Medications  ( .00IDGANTT  /  .00IDGANTTLIST  )   Current Cefepime (9/20-  Previous One dose of cephalexin  on 9/20     Current Medications as of 10/17/2023 Scheduled  PRN  allopurinol , 100 mg, Daily aspirin , 81 mg, Daily atorvastatin , 20 mg, Daily cefepime, 1 g, Q8H citalopram , 10 mg, Daily empagliflozin , 10 mg, Daily enoxaparin  (LOVENOX ) injection, 40 mg, Q24H SCH pantoprazole , 40 mg, Daily before breakfast spironolactone , 25 mg, Daily umeclidinium, 1 puff, Daily (RT)    acetaminophen , 1,000 mg, Q8H PRN albuterol , 2 puff, Q4H PRN ketorolac , 15 mg, Q6H PRN ondansetron , 4 mg, Q8H PRN oxyCODONE , 5 mg, Q4H PRN  Or oxyCODONE , 10 mg, Q4H PRN      Physical Exam   Temp:  [36.6 C (97.9 F)-36.8 C (98.2 F)] 36.6 C (97.9 F) Pulse:  [77-82] 77 Resp:  [17-24] 17 BP: (134-150)/(90-118) 134/90 MAP (mmHg):  [103-127] 103 FiO2 (%):  [24 %-28 %] 28 % SpO2:  [94 %-100 %] 100 % BMI (Calculated):  [31.79] 31.79  Actual body weight: 97.9 kg (215 lb 12.8 oz) Ideal body weight: 68.5 kg (151 lb 0.2 oz) Adjusted ideal body weight: 80.3 kg (176 lb 14.9 oz)  Physical Exam EXTREMITIES: Right leg pain decreased from initial presentation. Right leg swollen with unusual green discoloration, reduced after cleaning. Right ankle immobile.  Const [x]  vital signs above     [x]  WDWN, NAD, non-toxic appearance []      Eyes  [x]  Lids normal bilaterally, conjunctiva anicteric and noninjected OU [x]  PERRL  []      ENMT    [x]  Normal appearance of external nose and ears      [x]  OP clear   []  MMM, no lesions on lips or gums, dentition good       []  Hearing normal  []       Neck   []  Neck of normal appearance and trachea midline       []  No thyromegaly, nodules, or tenderness  [x]  supple    Lymph   [x]  No LAD in neck      []  No LAD in supraclavicular area      []  No LAD in axillae  []  No LAD in epitrochlear chains      []  No LAD in inguinal areas []      CV   [x]  RRR, no m/r/g, S1/S2      []  No peripheral edema, WWP      []  Pedal pulses intact  []      Resp  [x]  Normal WOB      []  CTAB  [x]      GI  [x]  Normal inspection, NTND, NABS      []   No umbilical hernia on exam      []  No hepatosplenomegaly      []  Inspection of perineal and perianal areas normal []      GU  []  Normal external genitalia      []      MSK  []  No clubbing or cyanosis of hands      [x]  No focal tenderness or abnormalities on palpation of joints in RUE, LUE, RLE, or LLE [x]  Able to flex R ankle without undue pain; mild swelling of R dorsal foot when compared to left. Non-tender.     Skin  [x]  No rashes, lesions, or ulcers of visualized skin      []  Skin warm and dry to palpation  [x]  ,green discoloration of midfoot on R -- this was able to be partially scrubbed off by me with an acetone pad (for nail polish removal) followed by soap/water     Neuro  [x]  CNs II-XII grossly intact      []  Sensation to light touch grossly intact throughout  []  DTRs normal and symmetric throughout  []  Unable to assess due to critical illness, sedation, or mental status []      Psych  [x]  Appropriate affect     [x]  Oriented to person, place, time     [x]  Judgment and insight are appropriate  []  Unable to assess due to critical illness, sedation, or mental status []        Patient Lines/Drains/Airways Status     Active Active Lines, Drains, & Airways     Name Placement date Placement time Site Days   Peripheral IV 10/15/23 Anterior;Right Forearm 10/15/23  1826  Forearm  1             Data for ID Decision Making  ( IDGENCONMDM )    Micro & Serological Data   ( RSLTMICRO   /  00CXNEW10  /  00CXSRC  /  00CXRES  /  00CXSUSC )  CRP 22   Recent Studies  ( RISRSLT ) PVL Venous Duplex Lower Extremity Right Result Date: 10/16/2023   No DVT    XR Ankle 3 or More Views Right Result Date: 10/16/2023 EXAM: XR ANKLE 3 OR MORE VIEWS RIGHT DATE: 10/16/2023 2:45 AM ACCESSION: [id number] UN DICTATED: 10/16/2023 3:06 AM INTERPRETATION LOCATION: [facility]  CLINICAL INDICATION: 58 years old Female with right ankle pain and swelling, concern for infection   COMPARION: Right foot x-ray and right foot and ankle MRI's performed the previous day.   TECHNIQUE: AP, oblique and lateral views of the right ankle.   1.  No acute fracture or acute traumatic malalignment of the right ankle. 2.  No osseous erosions. 3.  No ankle joint effusion. 4.  Soft tissue swelling about the visualized lower extremity. No soft tissue gas. 5.  Osteopenia. 6.  Mild degenerative changes of the midfoot. 7.  Please refer to the recent right ankle and foot MRI's for demonstration of findings that are not radiographically evident.     MRI Lower Extremity Joint Right W Wo Contrast Result Date: 10/16/2023 EXAM: MRI LOWER EXTREMITY JOINT RIGHT W WO CONTRAST DATE: 10/16/2023 12:42 AM ACCESSION: [id number] UN DICTATED: 10/16/2023 1:29 AM INTERPRETATION LOCATION: EXAM: MRI LOWER EXTREMITY JOINT RIGHT W WO CONTRAST DATE: 10/16/2023 12:42 AM ACCESSION: [id number] UN DICTATED: 10/16/2023 1:29 AM INTERPRETATION LOCATION: [facility]  CLINICAL INDICATION: 58 years old Female with pain, erythema, swelling, decreased range of motion, concern for underlying osteomyelitis or septic arthritis   COMPARISON: None.  TECHNIQUE: MRI of  the right ankle and hindfoot was performed before and after administration of IV contrast using a local coil.  Multisequence, multiplanar images were obtained.]  FINDINGS: No acute osteomyelitis. Erosions at the third TMT joint and fourth metatarsal base are in keeping with known history of gout. No  inflammatory changes in the surrounding soft tissues. Sub enthesial marrow edema of the lateral process of the calcaneal tuberosity at the attachment of the abductor digiti minimi.   Primarily bland edema throughout the visualized ankle and foot, however there is enhancing subcutaneous soft tissues in the medial and lateral aspects of the midfoot which can be seen with cellulitis. No fluid collections.   Small effusions of the posterior subtalar and talonavicular joints. No erosions at these joints.   Mild tenosynovitis of the posterior tibialis, flexor digitorum longus (FDL), and common peroneal sheath. Questionable short segment longitudinal split tear of the peroneus brevis along the lateral retromalleolar groove and hindfoot. Mild tendinosis of the Achilles. Mild retrocalcaneal bursitis.   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   Mild degenerative change of the midfoot.   1.  No acute osteomyelitis of the ankle/hindfoot. 2.  Cellulitis along the lateral and medial aspects of the midfoot on a background of primarily bland edema. 3.  No fluid collections. 4.  Mild posterior tibial, FDL, and peroneal tenosynovitis. 5.  Small effusions of the posterior subtalar and talonavicular joints without erosions. 6.  Achilles tendinosis.   MRI Lower Extremity Non-Joint Right W Wo Contrast Result Date: 10/16/2023 EXAM: MRI LOWER EXTREMITY NON-JOINT RIGHT W WO CONTRAST DATE: 10/16/2023 12:42 AM ACCESSION: [id number] UN DICTATED: 10/16/2023 1:18 AM INTERPRETATION LOCATION: [facility]   CLINICAL INDICATION: 58 years old Female with pain, erythema, swelling, decreased range of motion, concern for underlying osteomyelitis or septic arthritis    COMPARISON: None.   TECHNIQUE:  MRI of the right forefoot and midfoot was performed using a local coil. Multisequence, multiplanar images were obtained before and after the intravenous administration of  contrast.    FINDINGS: Soft tissue swelling about the forefoot, primarily bland edema, though with areas of enhancement lateral to the proximal end of the fifth metatarsal, possibly cellulitis.   No evidence of acute osteomyelitis.   Mild marrow signal abnormality with juxta-articular erosions along the medial aspect of the first MTP joint, possibly related to known gout. No surrounding soft tissue inflammatory changes. Marrow signal abnormality along the lateral aspect of the first interphalangeal joint is nonspecific, though may likewise be related to gout. Erosions at the third TMT joint and fourth metatarsal base are typical of gout.   Old healed fracture of the fourth metatarsal diaphysis.   Degenerative change of the first MTP joint, most pronounced of the medial metatarsosesamoid articulation. Mild degenerative changes of the midfoot.   Increased flexion of the third through fifth PIP joints may reflect claw toe deformities (versus voluntary flexion).   Tenosynovitis of the peroneus longus. Partially imaged talonavicular joint effusion.   Early adventitial bursal formation plantar to the first MTP joint.   1.  No acute osteomyelitis. 2.  Cellulitis along the lateral foot. 3.  Background of bland edema. 4.  Evidence of gout. 5.  Mild tenosynovitis of the peroneus longus.       Initial Consult Documentation from October 17, 2023   Sources of information include: chart review, patient, and treating providers.  HPI  History of Present Illness [name] is a 58 year old female with  COPD (  on 2L at home), HFpEF (EF 55-60% echo 08/2022),   gout,  who presented on 10/15/2023 with a painful, swollen R foot for a few weeks. She reports that her foot pain began on the second Saturday of September, initially presenting as redness and pain in the big toe, described as 'real swollen and tender. Thee pain then spread down the side of the foot and into the ankle, eventually involving the entire  lower leg. She couldn't walk.. She has been soaking  her foot in Epsom salt without relief. She had chills but no fevers. She also lost her appetitive.   She came to the hospital on 9/20, where she was afebrile with normal vital signs. WBC 6K.  She had Xray and MRI of the R foot, which showed swelling on dorsal surface. She had bright green discoloration of the plantar surface. The team was worried this might represent pseudomonas, so cefepime was started. There was no wound noted.  She reports that the pain and swelling has improved since admission, although it she says it is still present. Does not recall putting on green socks or putting her foot in anything green.  On my evaluation the patient reports pain still in whole foot, although notes it is improved.  Chills are improved. She has remained afebrile while admitted.    Past Medical History  Patient  has a past medical history of Adrenal adenoma, left (3.3cm) (09/28/2012), Allergic rhinitis (02/20/2013), Anxiety, Asthma (HHS-HCC), COPD (chronic obstructive pulmonary disease)    (CMS-HCC), Depression, Heart murmur, Hypertension, Mild cognitive impairment (12/25/2012), Obstructive sleep apnea (09/28/2012), Pulmonary embolism, R upper lobe (02/21/2012), Seasonal allergic rhinitis due to pollen (11/25/2014), Stroke    (CMS-HCC), and Visual impairment.   Meds and Allergies Patient has a current medication list which includes the following prescription(s): albuterol , allopurinol , amlodipine , aspirin , atorvastatin , citalopram , clonidine  hcl, colchicine , empagliflozin , hydralazine , metoprolol  succinate, pantoprazole , potassium chloride , spironolactone , tiotropium, lisinopril , medical supply, miscellaneous, miscellaneous medical supply, and oxycodone , and the following Facility-Administered Medications: acetaminophen , albuterol , allopurinol , aspirin , atorvastatin , cefepime (MAXIPIME) 1 g in sodium chloride  0.9 % (NS) 100 mL IVPB-MBP, citalopram , empagliflozin ,  enoxaparin , ketorolac , ondansetron , oxycodone  **OR** oxycodone , pantoprazole , spironolactone , umeclidinium.  Allergies: Morphine and Penicillins  Social History Patient  reports that she has quit smoking. She has never used smokeless tobacco. She reports current alcohol use of about 5.0 standard drinks of alcohol per week. She reports that she does not use drugs. Lives alone and is independent. Has one child and one grandchild. Not working. Disabled since her stroke.

## 2023-10-17 NOTE — Telephone Encounter (Signed)
 Copied from CRM #[id number]. Topic: General - Other >> Oct 17, 2023  1:50 PM Charlet HERO wrote: Reason for CRM: Patient is calling bc she has been in [facility] for 4 days and she is stating that she has an infection that has turned green she is going to be out today or tomorrow.

## 2023-10-18 ENCOUNTER — Telehealth: Payer: Self-pay

## 2023-10-18 NOTE — Telephone Encounter (Signed)
 Okay for verbal orders.

## 2023-10-18 NOTE — Telephone Encounter (Signed)
 [name]. With the center for intake for [facility] calling to follow up on previous message.   Called [facility] and spoke to [name], okay to give [employer] message from [name] and advise [name] will be following patient's HH. Advised [place], [place] verbalized understanding.

## 2023-10-18 NOTE — Telephone Encounter (Signed)
 Copied from CRM #[id number]. Topic: General - Other >> Oct 18, 2023  2:28 PM Yolanda T wrote: Reason for CRM: Ezella from [facility] called stated patient was discharged on 9/22 from [facility] with order for PT/OT and she needs verbal orders of who the PCP will be that will follow her home health care. Please f/u with Ezella at [phone].

## 2023-10-24 ENCOUNTER — Telehealth: Payer: Self-pay | Admitting: Family

## 2023-10-24 NOTE — Telephone Encounter (Signed)
 Called to confirm/remind patient of [facility] on 10[facility].   Appointment:   [] Confirmed  [] Left mess   [x] No answer/No voice mail  [] VM Full/unable to leave message  [] Phone not in service  Patient reminded to bring all medications and/or complete list.  Confirmed patient has transportation. Gave directions, instructed to utilize valet parking.

## 2023-10-24 NOTE — Progress Notes (Deleted)
 Advanced Heart Failure Clinic Note    PCP: Melvin Pao, NP  Primary Cardiologist: Florencio Kava, MD (last seen 02/22)  Chief Complaint: shortness of breath   HPI:  [name] is a 58 y/o female with a history of pulmonary embolus, obstructive sleep apnea, atrial fibrillation, COPD, gout, HTN, CVA, hyperlipidemia, depression, anxiety, OSA, remote tobacco use and chronic heart failure.   Had a pharmacologic stress trest done 09/12/12 and had an estimated EF of 49%.   Echo 06/17/16: EF of 55-65%. Echo 03/26/2018: EF of 60-65% along with mild/ moderate TR.    Catheterization 03/27/2018:  normal coronary arteries and normal left ventricular function.  Admitted 01/11/22 due to productive cough with greenish colored sputum, shortness of breath, fever, chills, malaise, sore throat, body aches. Denies chest pain. Patient also reports nausea, vomiting, diarrhea and abdominal pain. GI pathogen panel negative but C. difficile antigen positive with negative toxin with reflex to PCR which came back positive. Influenza A PCR positive.   Echo 09/21/22: EF 55-60% with moderate LVH, Grade II DD and mild dilatation of the aortic root, measuring 44 mm. There is mild dilatation of the ascending aorta, measuring 41 mm. There is mild dilatation of the aortic arch, measuring 39 mm.   Was in the ED 11/11/22 with flulike symptoms for 1-2 days. CXR negative. Potassium corrected. Elevated BP thought to be due to vomiting.   Was in the ED 05/19/23 with chest pain & vomiting after taking meds. Found to be in AF RVR. CXR negative, labs normal and EKG without evidence of STEMI. Pain improved and she was released.    Seen in [facility] 06/25 where diuretic was changed to torsemide .   She presents today for a follow-up visit with a chief complaint of shortness of breath. Has associated fatigue. Sleeping well on 4 pillows. Wearing CPAP nightly. Has not taken meds this morning and says that she occasionally doesn't take her  medications when she's feeling bad.   ROS: All systems negative except as listed in HPI, PMH and Problem List.  SH:  Social History   Socioeconomic History   Marital status: Legally Separated    Spouse name: Not on file   Number of children: Not on file   Years of education: Not on file   Highest education level: Not on file  Occupational History   Not on file  Tobacco Use   Smoking status: Former    Current packs/day: 0.00    Types: Cigarettes    Quit date: 01/26/2016    Years since quitting: 7.7   Smokeless tobacco: Never  Vaping Use   Vaping status: Never Used  Substance and Sexual Activity   Alcohol use: Yes    Comment: Occassionally   Drug use: No   Sexual activity: Never  Other Topics Concern   Not on file  Social History Narrative   Not on file   Social Drivers of Health   Financial Resource Strain: Low Risk  (10/17/2023)   Received from [facility]   Overall Financial Resource Strain (CARDIA)    How hard is it for you to pay for the very basics like food, housing, medical care, and heating?: Not very hard  Food Insecurity: No Food Insecurity (10/17/2023)   Received from [facility]   Hunger Vital Sign    Within the past 12 months, you worried that your food would run out before you got the money to buy more.: Never true    Within the past 12  months, the food you bought just didn't last and you didn't have money to get more.: Never true  Transportation Needs: No Transportation Needs (10/17/2023)   Received from [facility] - Transportation    Lack of Transportation (Medical): No    Lack of Transportation (Non-Medical): No  Physical Activity: Not on file  Stress: Not on file  Social Connections: Not on file  Intimate Partner Violence: Not At Risk (01/11/2022)   Humiliation, Afraid, Rape, and Kick questionnaire    Fear of Current or Ex-Partner: No    Emotionally Abused: No    Physically Abused: No    Sexually Abused: No    FH:   Family History  Problem Relation Age of Onset   Prostate cancer Father    Rectal cancer Sister    Breast cancer Sister    Cancer Maternal Grandmother    Cancer Maternal Grandfather     Past Medical History:  Diagnosis Date   A-fib (HCC) 2010   Adrenal adenoma, left 10/06/2004   a.) CT 10/06/2004 --> measured 3.1 x 1.6 cm   Alcohol abuse    Allergy    Anginal pain    Anxiety    Cardiac murmur    CHF (congestive heart failure) (HCC)    COPD (chronic obstructive pulmonary disease) (HCC)    Diverticulosis    Fatty liver    GERD (gastroesophageal reflux disease)    Gout    History of cocaine abuse (HCC)    a.) reports that use discontinued following CVA in 2010.   History of kidney stones    Hypertension    Mild cognitive impairment    Non-healing non-surgical wound    right leg   Obesity    On supplemental oxygen therapy    a.) 2 L/Silex   OSA on CPAP    Pulmonary embolus (HCC) 02/19/2012   a.) CTA --> RIGHT upper lobe.   Stroke [facility]) 2010   Thoracic ascending aortic aneurysm    a.) CT 10/28/2019 --> measured 4.6 cm.   Thyroid disease    Tricuspid valvular regurgitation    a.) TTE 03/26/2018 --> mild to moderate.    Current Outpatient Medications  Medication Sig Dispense Refill   allopurinol  (ZYLOPRIM ) 300 MG tablet Take 1 tablet (300 mg total) by mouth daily. 90 tablet 1   amLODipine  (NORVASC ) 10 MG tablet TAKE 1 TABLET BY MOUTH EVERY DAY 90 tablet 0   aspirin  EC 81 MG tablet Take 1 tablet (81 mg total) by mouth daily. 30 tablet 1   atorvastatin  (LIPITOR) 40 MG tablet TAKE 1 TABLET BY MOUTH EVERY DAY 90 tablet 0   Cholecalciferol  (VITAMIN D3) 25 MCG (1000 UT) CAPS Take 1 capsule (1,000 Units total) by mouth daily. 90 capsule 1   citalopram  (CELEXA ) 10 MG tablet TAKE 1 TABLET BY MOUTH EVERY DAY 90 tablet 0   cloNIDine  (CATAPRES ) 0.1 MG tablet TAKE 1 TABLET BY MOUTH EVERY DAY 90 tablet 0   colchicine  0.6 MG tablet TAKE 1 TABLET BY MOUTH EVERY DAY 90 tablet 1    empagliflozin  (JARDIANCE ) 10 MG TABS tablet Take 1 tablet (10 mg total) by mouth daily before breakfast. 90 tablet 3   gabapentin  (NEURONTIN ) 300 MG capsule TAKE 2 CAPSULES (600 MG TOTAL) BY MOUTH AT BEDTIME. 180 capsule 0   hydrALAZINE  (APRESOLINE ) 100 MG tablet TAKE 1 TABLET BY MOUTH 3 TIMES DAILY. 270 tablet 0   lisinopril  (ZESTRIL ) 40 MG tablet Take 1 tablet (40 mg total)  by mouth daily. 90 tablet 0   metoprolol  tartrate (LOPRESSOR ) 25 MG tablet Take 1 tablet (25 mg total) by mouth 2 (two) times daily. 60 tablet 0   ondansetron  (ZOFRAN -ODT) 4 MG disintegrating tablet Take 1 tablet (4 mg total) by mouth every 6 (six) hours as needed for nausea or vomiting. 20 tablet 0   pantoprazole  (PROTONIX ) 40 MG tablet TAKE 1 TABLET BY MOUTH EVERY DAY 90 tablet 3   potassium chloride  SA (KLOR-CON  M) 20 MEQ tablet Take 3 tablets (60 mEq total) by mouth daily.     spironolactone  (ALDACTONE ) 25 MG tablet Take 1 tablet (25 mg total) by mouth daily. 90 tablet 3   Tiotropium Bromide -Olodaterol 2.5-2.5 MCG/ACT AERS Inhale 2 puffs into the lungs daily. 4 g 3   torsemide  (DEMADEX ) 20 MG tablet Take 2 tablets (40 mg total) by mouth daily. 180 tablet 1   VENTOLIN  HFA 108 (90 Base) MCG/ACT inhaler TAKE 2 PUFFS BY MOUTH EVERY 6 HOURS AS NEEDED FOR WHEEZE OR SHORTNESS OF BREATH 18 each 2   No current facility-administered medications for this visit.   There were no vitals filed for this visit.  Wt Readings from Last 3 Encounters:  08/05/23 228 lb (103.4 kg)  07/14/23 223 lb (101.2 kg)  07/01/23 229 lb (103.9 kg)   Lab Results  Component Value Date   CREATININE 0.71 08/05/2023   CREATININE 0.71 07/14/2023   CREATININE 0.68 07/01/2023   PHYSICAL EXAM:  General: Well appearing. No resp difficulty HEENT: normal Neck: supple, no JVD Cor: Regular rhythm, rate. No rubs, gallops or murmurs Lungs: clear Abdomen: soft, nontender, nondistended. Extremities: no cyanosis, clubbing, rash, edema Neuro: alert &  oriented X 3. Moves all 4 extremities w/o difficulty. Affect pleasant   ECG: not done   ASSESSMENT & PLAN:  1: NICM with preserved ejection fraction- - likely due to HTN/ OSA as cath in 2020 was without CAD - NYHA Randall II - euvolemic - weight stable from last visit here 1 month ago; she says that by home weight, she has declined - Echo 06/17/16: EF of 55-65%. - Echo 03/26/2018: EF of 60-65% along with mild/ moderate TR.   - Echo 09/21/22: EF 55-60% with moderate LVH, Grade II DD and mild dilatation of the aortic root, measuring 44 mm. There is mild dilatation of the ascending aorta, measuring 41 mm. There is mild dilatation of the aortic arch, measuring 39 mm.  - Catheterization 03/27/2018: normal coronary arteries and normal left ventricular function. - not adding salt to her food. Reminded to closely follow a 2000mg  sodium diet  - saw cardiology Melva) 02/22; [facility] cardiology referral made at last visit 06/25 - continue lisinopril  40mg  daily - continue metoprolol  tartrate 25mg  BID - continue torsemide  40mg  daily  - continue potassium 60meq daily - continue spironolactone  25mg  daily - begin jardiance  10mg  daily; BMET today and repeat at next visit - BNP 05/19/23 was 44.4  2: HTN- - BP 155/103; has not taken meds yet this morning and admits that she occasionally doesn't take them at all - saw PCP [facility]) 06/25 - continue amlodipine  10mg  daily - continue clonidine  0.1mg  daily - continue hydralazine  100mg  TID - continue lisinopril  40mg  daily - BMP 07/14/23 reviewed: sodium 147, potassium 3.2, creatinine 0.71 and GFR 99 - BMP today  3: Obstructive sleep apnea- - reports sleeping well - wearing CPAP nightly - to see pulmonology 08/25  4: Depression/ anxiety- - continues to struggle with depression/ anxiety but overall says that she's  feeling well  5: Thoracic aneurysm- - stable per CT's - has been [facility] by [facility] cardiology; referral placed last  visit - Echo 09/21/22: EF 55-60% with moderate LVH, Grade II DD and mild dilatation of the aortic root, measuring 44 mm. There is mild dilatation of the ascending aorta, measuring 41 mm. There is mild dilatation of the aortic arch, measuring 39 mm.  - chest CTA 01/11/22: Ascending thoracic aorta is mildly dilated measuring 4.3 cm  6: Hyperlipidemia- - continue atorvastatin  40mg  daily; needs to be consistently taking this - LDL 07/14/23 was 109 - discussed increasing atorvastatin  but she declines as she'd like to try consistently taking it - recheck lipids in 3-4 months   Return in 1 month, sooner if needed. Encouraged consistent medication compliance.   Betty DELENA Class, FNP 10/24/23

## 2023-10-25 ENCOUNTER — Telehealth: Payer: Self-pay | Admitting: Family

## 2023-10-25 ENCOUNTER — Inpatient Hospital Stay: Admitting: Nurse Practitioner

## 2023-10-25 ENCOUNTER — Encounter: Admitting: Family

## 2023-10-25 NOTE — Telephone Encounter (Signed)
 Patient did not show for her Heart Failure Clinic appointment on 10/25/23. This is the 11th appointment that she has missed.

## 2023-10-25 NOTE — Progress Notes (Deleted)
 There were no vitals taken for this visit.   Subjective:    Patient ID: [name], female    DOB: 1965/12/22, 58 y.o.   MRN: [id number]  HPI: [name] is a 58 y.o. female  No chief complaint on file.  Transition of Care Hospital Follow up.   Hospital/Facility: [facility] D/C Physician: Dr. Rosslyn D/C Date:   Records Requested:  Records Received:  Records Reviewed:   Diagnoses on Discharge:   #R Foot Pain #C/F Gout Flare vs RLE Cellulitis P/W ongoing pain of 1st MCP joint of R foot lasting 1W, progressed to ankle pain w/ foot swelling and erythema lasting 2W, unresolved w/ allopurinol  100 mg daily and colchicine  0.6 mg daily, prompting presentation to ED. Initial ESR 34, CRP 22.5, without leukocytosis, normal uric acid. MRI noted cellulitis without osteomyelitis, as well as erosions c/w chronic hx of gout. Treated with IV Cefepime (defer Fluoroquinolone given her achilles tendinosis) before ID consulted with low c/f PsA species. Ortho also consulted, low c/f septic joint, no active indication for joint aspiration. Pain improved with antibiotics, discharged on Keflex  x7D per ID for possible cellulitis, and ibuprofen  for gout flare with plan for outpatient Rheumatology follow up.  #Diminished Taste  Decreased PO Intake  Nausea/Vomiting 3W reported history iso likely gout flare as above, consistent with prior episode of gout flare. Denies dysphagia, able to tolerate some solid intake prior to admission, swallowing pills appropriately while admitted. Endorses loss of taste as etiology for emesis when attempting oral intake of solids: discussed risk/benefits with patient, will monitor with resolution of gout flare/cellulitis, plan to follow up as outpatient.  #HTN Hx of resistant hypertension requiring numerous anti-HTN meds, however has been off of medications for 3 weeks due to poor PO as above. Relatively normotensive while admitted, resumed on Spironolactone  for HFpEF. Other  medications held with plan to titrate as outpatient on discharge.  #HFpEF Euvolemic during admission. HTN management as above. Resumed Spironolactone  and Jardiance  for GDMT  Chronic Problems #COPD - Resume home Spiriva  and Albuterol  PRN #HLD - Home ASA + Atorvastatin  #MDD - Home Celexa  10 mg daily #GERD - Continue Pantoprazole  40 mg daily #OSA - Continue CPAP nightly   Date of interactive Contact within 48 hours of discharge:  Contact was through: none  Date of 7 day or 14 day face-to-face visit:    within 14 days  Outpatient Encounter Medications as of 10/25/2023  Medication Sig   allopurinol  (ZYLOPRIM ) 300 MG tablet Take 1 tablet (300 mg total) by mouth daily.   amLODipine  (NORVASC ) 10 MG tablet TAKE 1 TABLET BY MOUTH EVERY DAY   aspirin  EC 81 MG tablet Take 1 tablet (81 mg total) by mouth daily.   atorvastatin  (LIPITOR) 40 MG tablet TAKE 1 TABLET BY MOUTH EVERY DAY   Cholecalciferol  (VITAMIN D3) 25 MCG (1000 UT) CAPS Take 1 capsule (1,000 Units total) by mouth daily.   citalopram  (CELEXA ) 10 MG tablet TAKE 1 TABLET BY MOUTH EVERY DAY   cloNIDine  (CATAPRES ) 0.1 MG tablet TAKE 1 TABLET BY MOUTH EVERY DAY   colchicine  0.6 MG tablet TAKE 1 TABLET BY MOUTH EVERY DAY   empagliflozin  (JARDIANCE ) 10 MG TABS tablet Take 1 tablet (10 mg total) by mouth daily before breakfast.   gabapentin  (NEURONTIN ) 300 MG capsule TAKE 2 CAPSULES (600 MG TOTAL) BY MOUTH AT BEDTIME.   hydrALAZINE  (APRESOLINE ) 100 MG tablet TAKE 1 TABLET BY MOUTH 3 TIMES DAILY.   lisinopril  (ZESTRIL ) 40 MG tablet Take 1  tablet (40 mg total) by mouth daily.   metoprolol  tartrate (LOPRESSOR ) 25 MG tablet Take 1 tablet (25 mg total) by mouth 2 (two) times daily.   ondansetron  (ZOFRAN -ODT) 4 MG disintegrating tablet Take 1 tablet (4 mg total) by mouth every 6 (six) hours as needed for nausea or vomiting.   pantoprazole  (PROTONIX ) 40 MG tablet TAKE 1 TABLET BY MOUTH EVERY DAY   potassium chloride  SA (KLOR-CON  M) 20 MEQ tablet  Take 3 tablets (60 mEq total) by mouth daily.   spironolactone  (ALDACTONE ) 25 MG tablet Take 1 tablet (25 mg total) by mouth daily.   Tiotropium Bromide -Olodaterol 2.5-2.5 MCG/ACT AERS Inhale 2 puffs into the lungs daily.   torsemide  (DEMADEX ) 20 MG tablet Take 2 tablets (40 mg total) by mouth daily.   VENTOLIN  HFA 108 (90 Base) MCG/ACT inhaler TAKE 2 PUFFS BY MOUTH EVERY 6 HOURS AS NEEDED FOR WHEEZE OR SHORTNESS OF BREATH   No facility-administered encounter medications on file as of 10/25/2023.    Diagnostic Tests Reviewed/Disposition:   Consults:  Discharge Instructions  Disease/illness Education:  Home Health/Community Services Discussions/Referrals:  Establishment or re-establishment of referral orders for community resources:  Discussion with other health care providers:  Assessment and Support of treatment regimen adherence:  Appointments Coordinated with:   Education for self-management, independent living, and ADLs:   Relevant past medical, surgical, family and social history reviewed and updated as indicated. Interim medical history since our last visit reviewed. Allergies and medications reviewed and updated.  Review of Systems  Per HPI unless specifically indicated above     Objective:    There were no vitals taken for this visit.  Wt Readings from Last 3 Encounters:  08/05/23 228 lb (103.4 kg)  07/14/23 223 lb (101.2 kg)  07/01/23 229 lb (103.9 kg)    Physical Exam  Results for orders placed or performed in visit on 08/05/23  Basic metabolic panel with GFR   Collection Time: 08/05/23 11:14 AM  Result Value Ref Range   Sodium 141 135 - 145 mmol/L   Potassium 3.4 (L) 3.5 - 5.1 mmol/L   Chloride 103 98 - 111 mmol/L   CO2 27 22 - 32 mmol/L   Glucose, Bld 106 (H) 70 - 99 mg/dL   BUN 16 6 - 20 mg/dL   Creatinine, Ser 9.28 0.44 - 1.00 mg/dL   Calcium  9.1 8.9 - 10.3 mg/dL   GFR, Estimated >39 >39 mL/min   Anion gap 11 5 - 15      Assessment & Plan:    Problem List Items Addressed This Visit   None    Follow up plan: No follow-ups on file.

## 2023-10-27 ENCOUNTER — Ambulatory Visit: Payer: Self-pay

## 2023-10-27 NOTE — Telephone Encounter (Signed)
 FYI Only or Action Required?: FYI only for provider.  Patient was last seen in primary care on 07/14/2023 by Melvin Pao, NP.  Called Nurse Triage reporting Foot Swelling.  Symptoms began several weeks ago.  Interventions attempted: Other: hospitalization, IV antibiotics, PO antibiotics, ibuprofen .  Symptoms are: gradually worsening.  Triage Disposition: Go to ED Now (overriding See HCP Within 4 Hours (Or PCP Triage))  Patient/caregiver understands and will follow disposition?: Yes  Reason for Disposition  [1] SEVERE pain (e.g., excruciating, unable to do any normal activities) AND [2] not improved after 2 hours of pain medicine  [1] Looks infected (e.g., spreading redness, pus) AND [2] large red area (> 2 inches or 5 cm)  Answer Assessment - Initial Assessment Questions Was in ED on 10/15/23 for right foot cellulitis. States symptoms are worsening since discharge: pain is severe and non-responsive to ibuprofen , foot is hot to the touch, edemenous and red, and her skin is sloughing. States she had to be hospitalized with IV antibiotics d/t severity. Per ED discharge and d/t severity of symptoms, pt was referred back to the ED.  Patient reports that she discontinued the antibiotic today d/t vaginitis. Was supposed to take until 10/31/23. Advised to let the ED know so they can evaluate the vaginitis as well.   1. ONSET: When did the pain start?      10/08/23  2. LOCATION: Where is the pain located?      Right foot  3. PAIN: How bad is the pain?    (Scale 1-10; or mild, moderate, severe)     Severe, 10/10 with use of ibuprofen   4. WORK OR EXERCISE: Has there been any recent work or exercise that involved this part of the body?      Unknown  5. CAUSE: What do you think is causing the foot pain?     Cellulitis   6. OTHER SYMPTOMS: Do you have any other symptoms? (e.g., leg pain, rash, fever, numbness)     Denies  Protocols used: Foot Pain-A-AH

## 2023-10-27 NOTE — Telephone Encounter (Addendum)
       pt was admitted to hospital for cellulitis of foot and they gave her ibuprofen  but she said her foot is flaring back up and its red and she dont know what to do please follow up with pt regarding this  First attempt; no answer  Second attempt; no answer

## 2023-10-31 ENCOUNTER — Telehealth: Payer: Self-pay

## 2023-10-31 NOTE — Transitions of Care (Post Inpatient/ED Visit) (Signed)
   10/31/2023  Name: [name] MRN: [id number] DOB: 1965/06/21  Today's TOC FU Call Status: Today's TOC FU Call Status:: Unsuccessful Call (1st Attempt) Unsuccessful Call (1st Attempt) Date: 10/31/23  Attempted to reach the patient regarding the most recent Inpatient/ED visit.  Follow Up Plan: Additional outreach attempts will be made to reach the patient to complete the Transitions of Care (Post Inpatient/ED visit) call.   Signature  Avelina Essex, CMA (AAMA)  [facility] [phone]

## 2024-02-08 ENCOUNTER — Other Ambulatory Visit: Payer: Self-pay | Admitting: Nurse Practitioner

## 2024-02-08 NOTE — Telephone Encounter (Signed)
 Copied from CRM #[id number]. Topic: Clinical - Medication Refill >> Feb 08, 2024 11:08 AM [name] wrote: Medication: allopurinol  (ZYLOPRIM ) 300 MG tablet   Has the patient contacted their pharmacy? Yes (Agent: If no, request that the patient contact the pharmacy for the refill. If patient does not wish to contact the pharmacy document the reason why and proceed with request.) (Agent: If yes, when and what did the pharmacy advise?)  This is the patient's preferred pharmacy:  [id number] - [place], [place] - [address] [place] [address] Phone: [phone] Fax: [phone]  Is this the correct pharmacy for this prescription? Yes If no, delete pharmacy and type the correct one.   Has the prescription been filled recently? Yes  Is the patient out of the medication? Yes  Has the patient been seen for an appointment in the last year OR does the patient have an upcoming appointment? Yes  Can we respond through MyChart? Yes  Agent: Please be advised that Rx refills may take up to 3 business days. We ask that you follow-up with your pharmacy.

## 2024-02-09 NOTE — Telephone Encounter (Signed)
 Requested medication (s) are due for refill today -expired Rx  Requested medication (s) are on the active medication list -yes  Future visit scheduled -no  Last refill: 10/16/21 #90 1RF  Notes to clinic: expired Rx- sent for review   Requested Prescriptions  Pending Prescriptions Disp Refills   allopurinol  (ZYLOPRIM ) 300 MG tablet 90 tablet 1    Sig: Take 1 tablet (300 mg total) by mouth daily.     Endocrinology:  Gout Agents - allopurinol  Failed - 02/09/2024 12:32 PM      Failed - Uric Acid in normal range and within 360 days    Uric Acid  Date Value Ref Range Status  07/02/2019 8.2 (H) 3.0 - 7.2 mg/dL Final    Comment:               Therapeutic target for gout patients: <6.0         Passed - Cr in normal range and within 360 days    Creatinine  Date Value Ref Range Status  01/15/2014 1.36 (H) 0.60 - 1.30 mg/dL Final   Creatinine, Ser  Date Value Ref Range Status  08/05/2023 0.71 0.44 - 1.00 mg/dL Final         Passed - Valid encounter within last 12 months    Recent Outpatient Visits           7 months ago Chronic diastolic heart failure (HCC)   [facility] Melvin Pao, NP              Passed - CBC within normal limits and completed in the last 12 months    WBC  Date Value Ref Range Status  05/19/2023 8.8 4.0 - 10.5 K/uL Final   RBC  Date Value Ref Range Status  05/19/2023 4.28 3.87 - 5.11 MIL/uL Final   Hemoglobin  Date Value Ref Range Status  05/19/2023 13.7 12.0 - 15.0 g/dL Final  90/77/7976 86.3 11.1 - 15.9 g/dL Final   HCT  Date Value Ref Range Status  05/19/2023 41.4 36.0 - 46.0 % Final   Hematocrit  Date Value Ref Range Status  10/16/2021 42.6 34.0 - 46.6 % Final   MCHC  Date Value Ref Range Status  05/19/2023 33.1 30.0 - 36.0 g/dL Final   [facility]  Date Value Ref Range Status  05/19/2023 32.0 26.0 - 34.0 pg Final   MCV  Date Value Ref Range Status  05/19/2023 96.7 80.0 - 100.0 fL Final  10/16/2021 95 79  - 97 fL Final  01/15/2014 87 80 - 100 fL Final   No results found for: PLTCOUNTKUC, LABPLAT, POCPLA RDW  Date Value Ref Range Status  05/19/2023 13.9 11.5 - 15.5 % Final  10/16/2021 14.0 11.7 - 15.4 % Final  01/15/2014 16.0 (H) 11.5 - 14.5 % Final            Requested Prescriptions  Pending Prescriptions Disp Refills   allopurinol  (ZYLOPRIM ) 300 MG tablet 90 tablet 1    Sig: Take 1 tablet (300 mg total) by mouth daily.     Endocrinology:  Gout Agents - allopurinol  Failed - 02/09/2024 12:32 PM      Failed - Uric Acid in normal range and within 360 days    Uric Acid  Date Value Ref Range Status  07/02/2019 8.2 (H) 3.0 - 7.2 mg/dL Final    Comment:               Therapeutic target for gout patients: <6.0  Passed - Cr in normal range and within 360 days    Creatinine  Date Value Ref Range Status  01/15/2014 1.36 (H) 0.60 - 1.30 mg/dL Final   Creatinine, Ser  Date Value Ref Range Status  08/05/2023 0.71 0.44 - 1.00 mg/dL Final         Passed - Valid encounter within last 12 months    Recent Outpatient Visits           7 months ago Chronic diastolic heart failure (HCC)   [facility] Melvin Pao, NP              Passed - CBC within normal limits and completed in the last 12 months    WBC  Date Value Ref Range Status  05/19/2023 8.8 4.0 - 10.5 K/uL Final   RBC  Date Value Ref Range Status  05/19/2023 4.28 3.87 - 5.11 MIL/uL Final   Hemoglobin  Date Value Ref Range Status  05/19/2023 13.7 12.0 - 15.0 g/dL Final  90/77/7976 86.3 11.1 - 15.9 g/dL Final   HCT  Date Value Ref Range Status  05/19/2023 41.4 36.0 - 46.0 % Final   Hematocrit  Date Value Ref Range Status  10/16/2021 42.6 34.0 - 46.6 % Final   MCHC  Date Value Ref Range Status  05/19/2023 33.1 30.0 - 36.0 g/dL Final   [facility]  Date Value Ref Range Status  05/19/2023 32.0 26.0 - 34.0 pg Final   MCV  Date Value Ref Range Status  05/19/2023 96.7  80.0 - 100.0 fL Final  10/16/2021 95 79 - 97 fL Final  01/15/2014 87 80 - 100 fL Final   No results found for: PLTCOUNTKUC, LABPLAT, POCPLA RDW  Date Value Ref Range Status  05/19/2023 13.9 11.5 - 15.5 % Final  10/16/2021 14.0 11.7 - 15.4 % Final  01/15/2014 16.0 (H) 11.5 - 14.5 % Final

## 2024-02-17 ENCOUNTER — Ambulatory Visit: Payer: Self-pay

## 2024-02-17 NOTE — Telephone Encounter (Signed)
 Patient will need documented office visit stating use and benefit before a new machine can be ordered. Is also scheduled tomorrow with a sleep clinic.

## 2024-02-17 NOTE — Telephone Encounter (Addendum)
 FYI Only or Action Required?: Action required by provider: update on patient condition.  Patient was last seen in primary care on 07/14/2023 by Melvin Pao, NP.  Called Nurse Triage reporting Headache.  Symptoms began since 02/11/24.  Interventions attempted: Nothing.  Symptoms are: stable.  Triage Disposition: Home Care  Patient/caregiver understands and will follow disposition?: Advised pt and called [employer] who manages her CPAP machine. Machine is 58 years old and since it broke has been unable to get a replacement and is having headaches. [employer] office and reached a busy signal.         Message from Berwyn MATSU sent at 02/17/2024  2:08 PM EST  Reason for Triage: patient cpap broke since 02/10/24 and has not been able to sleep and is now having headaches and is not sure what to do.   Reason for Disposition  Headache  Answer Assessment - Initial Assessment Questions Pt waking up with headache since her CPAP machine broke on 02/11/24. She has spoken to [facility] who was supposed to get her a CPAP machine. Called [facility] [facility] and no one answered. Called back and LM on VM and left pt name and DOB and problem. Pt stated she is sitting up in recliner and wakes up gasping for air and that she feels weakness. Does not have BP machine or O2 sat monitor. Forwarding to office.      1. LOCATION: Where does it hurt?      head 2. ONSET: When did the headache start? (e.g., minutes, hours, days)      02/11/24 3. PATTERN: Does the pain come and go, or has it been constant since it started?     Pain varies but having everyday  4. SEVERITY: How bad is the pain? and What does it keep you from doing?  (e.g., Scale 1-10; mild, moderate, or severe)     7/10 5. RECURRENT SYMPTOM: Have you ever had headaches before? If Yes, ask: When was the last time? and What happened that time?      Yes long time  6. CAUSE: What do you think is causing the headache?      No CPAP sleep apnea 7. MIGRAINE: Have you been diagnosed with migraine headaches? If Yes, ask: Is this headache similar?      no 8. HEAD INJURY: Has there been any recent injury to your head?      no 9. OTHER SYMPTOMS: Do you have any other symptoms? (e.g., fever, stiff neck, eye pain, sore throat, cold symptoms)     Weakness- CPAP machine broke  Protocols used: Headache-A-AH

## 2024-02-18 ENCOUNTER — Emergency Department: Admission: EM | Admit: 2024-02-18 | Discharge: 2024-02-18 | Disposition: A | Discharge status: Home / Self Care

## 2024-02-18 DIAGNOSIS — J449 Chronic obstructive pulmonary disease, unspecified: Secondary | ICD-10-CM | POA: Diagnosis not present

## 2024-02-18 DIAGNOSIS — I110 Hypertensive heart disease with heart failure: Secondary | ICD-10-CM | POA: Insufficient documentation

## 2024-02-18 DIAGNOSIS — I509 Heart failure, unspecified: Secondary | ICD-10-CM | POA: Diagnosis not present

## 2024-02-18 DIAGNOSIS — G4700 Insomnia, unspecified: Secondary | ICD-10-CM | POA: Insufficient documentation

## 2024-02-18 NOTE — ED Notes (Addendum)
 Patient ambulated safely with steady gait from EMS stretcher to ED stretcher.

## 2024-02-18 NOTE — ED Provider Notes (Signed)
 "  [facility] Provider Note    Event Date/Time   First MD Initiated Contact with Patient 02/18/24 [phone]     (approximate)   History   Insomnia   HPI  [name] is a 59 y.o. female with past medical history of COPD, atrial fibrillation, hypertension, presenting to the emergency department via EMS complaining of insomnia.  The patient reports that she is supposed be wearing a CPAP at night however her machine broke on 1/16.  She states that she has been unable to sleep since.  She has had 2 doctors appointments already about the CPAP and has an appointment on 1/28 to confirm CPAP is ready.  She reports a headache.  Denies any chest pain, shortness of breath, one-sided weakness/numbness/tingling.     Physical Exam   Triage Vital Signs: ED Triage Vitals  Encounter Vitals Group     BP 02/18/24 0646 (!) 171/119     Girls Systolic BP Percentile --      Girls Diastolic BP Percentile --      Boys Systolic BP Percentile --      Boys Diastolic BP Percentile --      Pulse Rate 02/18/24 0646 81     Resp 02/18/24 0646 18     Temp 02/18/24 0646 97.8 F (36.6 C)     Temp Source 02/18/24 0646 Oral     SpO2 02/18/24 0644 96 %     Weight 02/18/24 0649 221 lb 9 oz (100.5 kg)     Height 02/18/24 0649 5' 10 (1.778 m)     Head Circumference --      Peak Flow --      Pain Score --      Pain Loc --      Pain Education --      Exclude from Growth Chart --     Most recent vital signs: Vitals:   02/18/24 0644 02/18/24 0646  BP:  (!) 171/119  Pulse:  81  Resp:  18  Temp:  97.8 F (36.6 C)  SpO2: 96% 97%     General: Awake, no distress.  CV:  Good peripheral perfusion.  Resp:  Normal effort.  Abd:  No distention.  Other:     ED Results / Procedures / Treatments   Labs (all labs ordered are listed, but only abnormal results are displayed) Labs Reviewed - No data to display   EKG     RADIOLOGY     PROCEDURES:  Critical Care performed:  No  Procedures   MEDICATIONS ORDERED IN ED: Medications - No data to display   IMPRESSION / MDM / ASSESSMENT AND PLAN / ED COURSE  I reviewed the triage vital signs and the nursing notes.                              Differential diagnosis includes, but is not limited to,   Patient's presentation is most consistent with exacerbation of chronic illness.  Patient is a 59 year old female with past medical history of CHF, COPD, hypertension, hyperlipidemia, presenting to the emergency department via EMS complaining of insomnia since her CPAP broke on 1/16.  The patient has seen her PCP about this issue and was able to confirm another appointment on 1/28 for her CPAP to be ready.  Her oxygen saturation is 99% on room air.  She is awake, alert, and oriented x 4.  She is complaining of a  headache however denies any neurologic deficits or other symptoms.  She declined medication for her headache at this time.  Patient was also noted to be hypertensive with EMS and remained hypertensive here in the ER however she declined treatment for this as well stating that she has blood pressure medication at home.  I explained to the patient that I was unable to provide her with the CPAP machine here to take home and that I could not admit her just for insomnia and for the CPAP.  The patient voiced her understanding.  She has declined any other treatment.  She will be discharged with instructions to follow-up with her primary care physician.  Return to the emergency department for new or worsening symptoms.      FINAL CLINICAL IMPRESSION(S) / ED DIAGNOSES   Final diagnoses:  Insomnia, unspecified type     Rx / DC Orders   ED Discharge Orders     None        Note:  This document was prepared using Dragon voice recognition software and may include unintentional dictation errors.   Rexford Reche HERO, MD 02/18/24 [phone]  "

## 2024-02-18 NOTE — ED Triage Notes (Addendum)
 Pt to ED BIB [facility] with c/o insomnia. Pt reports hx of COPD and sleep apnea. Supposed to wear Cpap at night, however reports her machine broke on 1/16. Has not been able to sleep since. Pt noted to be markedly hypertensive (215/144) with EMS. Hx of HTN. Has not had her blood pressure medication yet this morning. Denies chest pain/shortness of breath. Had a doctors appointment yesterday where she was able to confirm her new Cpap machine will be ready on 1/28

## 2024-02-21 NOTE — Progress Notes (Signed)
 Sleep Medicine   Office Visit  Patient Name: [name] DOB: [birth year] MRN [id number]    Chief Complaint: OSA  Brief History:  [name] is seen today for a consult for sleep evaluation to re-establish care on CPAP @ 15cmH20 with a 6.5 year history of OSA.  Patient has been using machine up until it stopped working on 02/13/2024.   Patient reports her sleep quality is poor without PAP. This is noted recently. The patient relates the following symptoms: headaches are also present. The patients sleep schedule is very sporadic.  Patient reports multiple awakenings throughout the night.    Sleep quality is the same when outside home environment.   Patient was using PAP nightly.  The patient felt rested after sleeping with PAP.  The patient reports benefit from PAP use. Reported sleepiness was improved with PAP use and the Epworth Sleepiness Score is 5 out of 24. The patient does take naps often during the week, she mentions that she falls asleep at various times throughout the day. The patient complains of the following: Patient is in need a new machine,  The compliance download shows 92% compliance with an average use time of 7 hours 56 minutes. The AHI is 0.7.  Patient has noted no restlessness of her legs at night as long as she takes her gabapentin .  Patient's machine is over 77 year old, out of warranty and obsolete for repair.  To ensure reliable treatment for OSA, a replacement is needed.     ROS  General: (-) fever, (-) chills, (-) night sweat Nose and Sinuses: (-) nasal stuffiness or itchiness, (-) postnasal drip, (-) nosebleeds, (-) sinus trouble. Mouth and Throat: (-) sore throat, (-) hoarseness. Neck: (-) swollen glands, (-) enlarged thyroid, (-) neck pain. Respiratory: - cough, - shortness of breath, - wheezing. Neurologic: - numbness, - tingling. Psychiatric: + anxiety, + depression    Current Medication: Outpatient Encounter Medications as of 02/22/2024  Medication Sig    allopurinol  (ZYLOPRIM ) 300 MG tablet Take 1 tablet (300 mg total) by mouth daily.   amLODipine  (NORVASC ) 10 MG tablet TAKE 1 TABLET BY MOUTH EVERY DAY   aspirin  EC 81 MG tablet Take 1 tablet (81 mg total) by mouth daily.   atorvastatin  (LIPITOR) 40 MG tablet TAKE 1 TABLET BY MOUTH EVERY DAY   Cholecalciferol  (VITAMIN D3) 25 MCG (1000 UT) CAPS Take 1 capsule (1,000 Units total) by mouth daily.   citalopram  (CELEXA ) 10 MG tablet TAKE 1 TABLET BY MOUTH EVERY DAY   cloNIDine  (CATAPRES ) 0.1 MG tablet TAKE 1 TABLET BY MOUTH EVERY DAY   colchicine  0.6 MG tablet TAKE 1 TABLET BY MOUTH EVERY DAY   empagliflozin  (JARDIANCE ) 10 MG TABS tablet Take 1 tablet (10 mg total) by mouth daily before breakfast.   gabapentin  (NEURONTIN ) 300 MG capsule TAKE 2 CAPSULES (600 MG TOTAL) BY MOUTH AT BEDTIME.   hydrALAZINE  (APRESOLINE ) 100 MG tablet TAKE 1 TABLET BY MOUTH 3 TIMES DAILY.   lisinopril  (ZESTRIL ) 40 MG tablet Take 1 tablet (40 mg total) by mouth daily.   metoprolol  tartrate (LOPRESSOR ) 25 MG tablet Take 1 tablet (25 mg total) by mouth 2 (two) times daily.   ondansetron  (ZOFRAN -ODT) 4 MG disintegrating tablet Take 1 tablet (4 mg total) by mouth every 6 (six) hours as needed for nausea or vomiting.   pantoprazole  (PROTONIX ) 40 MG tablet TAKE 1 TABLET BY MOUTH EVERY DAY   potassium chloride  SA (KLOR-CON  M) 20 MEQ tablet Take 3 tablets (60 mEq total)  by mouth daily.   spironolactone  (ALDACTONE ) 25 MG tablet Take 1 tablet (25 mg total) by mouth daily.   torsemide  (DEMADEX ) 20 MG tablet Take 2 tablets (40 mg total) by mouth daily.   VENTOLIN  HFA 108 (90 Base) MCG/ACT inhaler TAKE 2 PUFFS BY MOUTH EVERY 6 HOURS AS NEEDED FOR WHEEZE OR SHORTNESS OF BREATH   No facility-administered encounter medications on file as of 02/22/2024.    Surgical History: Past Surgical History:  Procedure Laterality Date   CYSTOSCOPY WITH STENT PLACEMENT Left 10/02/2020   Procedure: CYSTOSCOPY WITH STENT PLACEMENT;  Surgeon: Francisca Redell BROCKS, MD;  Location: [facility] ORS;  Service: Urology;  Laterality: Left;   CYSTOSCOPY/URETEROSCOPY/HOLMIUM LASER/STENT PLACEMENT Left 10/24/2020   Procedure: CYSTOSCOPY/URETEROSCOPY/HOLMIUM LASER/STENT EXCHANGE;  Surgeon: Francisca Redell BROCKS, MD;  Location: [facility] ORS;  Service: Urology;  Laterality: Left;   LEFT HEART CATH AND CORONARY ANGIOGRAPHY N/A 03/27/2018   Procedure: LEFT HEART CATH AND CORONARY ANGIOGRAPHY;  Surgeon: Ammon Blunt, MD;  Location: [facility] INVASIVE CV LAB;  Service: Cardiovascular;  Laterality: N/A;   ORIF ANKLE FRACTURE BIMALLEOLAR Left 09/20/2001   ORIF WRIST FRACTURE Bilateral 2010    Medical History: Past Medical History:  Diagnosis Date   A-fib [facility]) 2010   Adrenal adenoma, left 10/06/2004   a.) CT 10/06/2004 --> measured 3.1 x 1.6 cm   Alcohol abuse    Allergy    Anginal pain    Anxiety    Cardiac murmur    CHF (congestive heart failure) (HCC)    COPD (chronic obstructive pulmonary disease) (HCC)    Diverticulosis    Fatty liver    GERD (gastroesophageal reflux disease)    Gout    History of cocaine abuse (HCC)    a.) reports that use discontinued following CVA in 2010.   History of kidney stones    Hypertension    Mild cognitive impairment    Non-healing non-surgical wound    right leg   Obesity    On supplemental oxygen therapy    a.) 2 L/Brady   OSA on CPAP    Pulmonary embolus (HCC) 02/19/2012   a.) CTA --> RIGHT upper lobe.   Stroke [facility]) 2010   Thoracic ascending aortic aneurysm    a.) CT 10/28/2019 --> measured 4.6 cm.   Thyroid disease    Tricuspid valvular regurgitation    a.) TTE 03/26/2018 --> mild to moderate.    Family History: Non contributory to the present illness  Social History: Social History   Socioeconomic History   Marital status: Legally Separated    Spouse name: Not on file   Number of children: Not on file   Years of education: Not on file   Highest education level: Not on file  Occupational History   Not on  file  Tobacco Use   Smoking status: Former    Current packs/day: 0.00    Types: Cigarettes    Quit date: 01/26/2016    Years since quitting: 8.0   Smokeless tobacco: Never  Vaping Use   Vaping status: Never Used  Substance and Sexual Activity   Alcohol use: Yes    Comment: Occassionally   Drug use: No   Sexual activity: Never  Other Topics Concern   Not on file  Social History Narrative   Not on file   Social Drivers of Health   Tobacco Use: Medium Risk (02/22/2024)   Patient History    Smoking Tobacco Use: Former    Smokeless Tobacco Use: Never  Passive Exposure: Not on file  Financial Resource Strain: Low Risk (10/17/2023)   Received from [facility]   Overall Financial Resource Strain (CARDIA)    How hard is it for you to pay for the very basics like food, housing, medical care, and heating?: Not very hard  Food Insecurity: No Food Insecurity (10/17/2023)   Received from [facility]   Epic    Within the past 12 months, you worried that your food would run out before you got the money to buy more.: Never true    Within the past 12 months, the food you bought just didn't last and you didn't have money to get more.: Never true  Transportation Needs: No Transportation Needs (10/17/2023)   Received from [facility] - Transportation    Lack of Transportation (Medical): No    Lack of Transportation (Non-Medical): No  Physical Activity: Not on file  Stress: Not on file  Social Connections: Not on file  Intimate Partner Violence: Not At Risk (01/11/2022)   Humiliation, Afraid, Rape, and Kick questionnaire    Fear of Current or Ex-Partner: No    Emotionally Abused: No    Physically Abused: No    Sexually Abused: No  Depression (PHQ2-9): High Risk (07/14/2023)   Depression (PHQ2-9)    PHQ-2 Score: 15  Alcohol Screen: Not on file  Housing: Low Risk (01/11/2022)   Housing    Last Housing Risk Score: 0  Utilities: Not At Risk (01/11/2022)   A[facility]  Utilities    Threatened with loss of utilities: No  Health Literacy: Not on file    Vital Signs: Blood pressure (!) 167/134, pulse 90, resp. rate 20, height 5' 10 (1.778 m), weight 225 lb (102.1 kg), SpO2 91%. Body mass index is 32.28 kg/m.   Examination: General Appearance: The patient is well-developed, well-nourished, and in no distress. Neck Circumference: 40.5cm Skin: Gross inspection of skin unremarkable. Head: normocephalic, no gross deformities. Eyes: no gross deformities noted. ENT: ears appear grossly normal Neurologic: Alert and oriented. No involuntary movements.    STOP BANG RISK ASSESSMENT S (snore) Have you been told that you snore?     YES   T (tired) Are you often tired, fatigued, or sleepy during the day?   YES  O (obstruction) Do you stop breathing, choke, or gasp during sleep? YES   P (pressure) Do you have or are you being treated for high blood pressure? YES   B (BMI) Is your body index greater than 35 kg/m? NO   A (age) Are you 38 years old or older? YES   N (neck) Do you have a neck circumference greater than 16 inches?   NO   G (gender) Are you a female? NO   TOTAL STOP/BANG YES ANSWERS 5                                                               A STOP-Bang score of 2 or less is considered low risk, and a score of 5 or more is high risk for having either moderate or severe OSA. For people who score 3 or 4, doctors may need to perform further assessment to determine how likely they are to have OSA.  EPWORTH SLEEPINESS SCALE:  Scale:  (0)= no chance of dozing; (1)= slight chance of dozing; (2)= moderate chance of dozing; (3)= high chance of dozing  Chance  Situtation    Sitting and reading: 1    Watching TV: 2    Sitting Inactive in public: 0    As a passenger in car: 0      Lying down to rest: 2    Sitting and talking: 0    Sitting quielty after lunch: 0    In a car, stopped in traffic: 0   TOTAL SCORE:   5 out  of 24   CPAP COMPLIANCE DATA:  Date Range: 08/21/2023 - 02/16/2024  Average Daily Use: 8 hours 18 minutes  Median Use: 8 hours 13 minutes  Compliance for > 4 Hours: 92% days  AHI: 0.7 respiratory events per hour  Days Used: 172/180  Mask Leak: 19.8  95th Percentile Pressure: 15cmH20   SLEEP STUDIES:  Split Study (06/30/2017) AHI 112.3/hr, min Sp02 67%, CPAP @ 15 cmH2O   LABS: No results found for this or any previous visit (from the past 2160 hours).  Radiology: No results found.  No results found.  No results found.    Assessment and Plan: Patient Active Problem List   Diagnosis Date Noted   Left leg swelling 02/08/2023   Depression with anxiety 01/11/2022   Hypokalemia 01/11/2022   Morbid obesity (HCC) 01/11/2022   Stroke (HCC) 01/11/2022   Atrial fibrillation, chronic (HCC) 01/11/2022   Alcohol use 01/11/2022   Chronic pain of right knee 04/15/2021   Anxiety 03/03/2021   OSA on CPAP 04/21/2020   Thoracic ascending aortic aneurysm 03/03/2020   Lymphedema 12/06/2018   IFG (impaired fasting glucose) 11/03/2018   Unstable angina (HCC) 03/25/2018   Gout 05/09/2017   COPD (chronic obstructive pulmonary disease) (HCC) 04/10/2017   History of pulmonary embolism 04/10/2017   History of thyroid disease 04/10/2017   Hyperlipidemia 12/31/2016   Chronic diastolic heart failure (HCC) 05/26/2016   Adrenal adenoma 09/28/2012   Essential hypertension 10/27/2009   Esophageal reflux 12/23/2008     PLAN OSA:   Patient was using and benefiting from PAP use prior to machine breaking recently. Compliance is excellent and AHI controlled at 0.7. Patient continues to require PAP to treat their apnea and is medically necessary. The patient's machine is past end of life and must be replaced. Patient will be set up on a loaner until new set up.  1. OSA on CPAP (Primary) continue excellent compliance. Follow up 30+ days after set up  2. CPAP use counseling CPAP  couseling-Discussed importance of adequate CPAP use as well as proper care and cleaning techniques of machine and all supplies.  3. Essential hypertension Very elevated in office and reports this is normal for her. She is asymptomatic. Advised to contact PCP/cardiology to address and seek care if symptoms arise.  4. Chronic diastolic heart failure (HCC) Followed by cardiology  5. Obesity (BMI 30.0-34.9) Obesity Counseling: Had a lengthy discussion regarding patients BMI and weight issues. Patient was instructed on portion control as well as increased activity. Also discussed caloric restrictions with trying to maintain intake less than 2000 Kcal. Discussions were made in accordance with the 5As of weight management. Simple actions such as not eating late and if able to, taking a walk is suggested.    General Counseling: I have discussed the findings of the evaluation and examination with Barnie.  I have also discussed any further diagnostic evaluation thatmay  be needed or ordered today. Amaurie verbalizes understanding of the findings of todays visit. We also reviewed her medications today and discussed drug interactions and side effects including but not limited excessive drowsiness and altered mental states. We also discussed that there is always a risk not just to her but also people around her. she has been encouraged to call the office with any questions or concerns that should arise related to todays visit.  No orders of the defined types were placed in this encounter.       I have personally obtained a history, evaluated the patient, evaluated pertinent data, formulated the assessment and plan and placed orders.  This patient was seen by Tinnie Pro, PA-C in collaboration with Dr. Elfreda Bathe as a part of collaborative care agreement.    Elfreda DELENA Bathe, MD [facility] Diplomate ABMS Pulmonary and Critical Care Medicine Sleep medicine

## 2024-02-22 ENCOUNTER — Ambulatory Visit: Admitting: Internal Medicine

## 2024-02-22 VITALS — BP 167/134 | HR 90 | Resp 20 | Ht 70.0 in | Wt 225.0 lb

## 2024-02-22 DIAGNOSIS — G4733 Obstructive sleep apnea (adult) (pediatric): Secondary | ICD-10-CM | POA: Diagnosis not present

## 2024-02-22 DIAGNOSIS — E66811 Obesity, class 1: Secondary | ICD-10-CM

## 2024-02-22 DIAGNOSIS — Z7189 Other specified counseling: Secondary | ICD-10-CM

## 2024-02-22 DIAGNOSIS — I10 Essential (primary) hypertension: Secondary | ICD-10-CM | POA: Diagnosis not present

## 2024-02-22 DIAGNOSIS — I5032 Chronic diastolic (congestive) heart failure: Secondary | ICD-10-CM

## 2024-02-22 NOTE — Patient Instructions (Signed)

## 2024-02-29 ENCOUNTER — Ambulatory Visit: Admitting: Nurse Practitioner

## 2024-02-29 ENCOUNTER — Other Ambulatory Visit: Payer: Self-pay | Admitting: Nurse Practitioner

## 2024-02-29 NOTE — Telephone Encounter (Signed)
 Copied from CRM #[id number]. Topic: Clinical - Medication Refill >> Feb 29, 2024  8:23 AM Tiffini S wrote: Medication: allopurinol  (ZYLOPRIM ) 300 MG tablet, potassium chloride  SA (KLOR-CON  M) 20 MEQ tablet  Has the patient contacted their pharmacy? Yes (Agent: If no, request that the patient contact the pharmacy for the refill. If patient does not wish to contact the pharmacy document the reason why and proceed with request.) (Agent: If yes, when and what did the pharmacy advise?)  This is the patient's preferred pharmacy:  [id number] - [place], [place] - [address] [place] [address] Phone: [phone] Fax: [phone]  Is this the correct pharmacy for this prescription? Yes If no, delete pharmacy and type the correct one.   Has the prescription been filled recently? Yes  Is the patient out of the medication? Yes  Has the patient been seen for an appointment in the last year OR does the patient have an upcoming appointment? Yes  Can we respond through MyChart? No, please call the patient at [phone]  Agent: Please be advised that Rx refills may take up to 3 business days. We ask that you follow-up with your pharmacy.

## 2024-03-01 NOTE — Telephone Encounter (Signed)
 Requested medication (s) are due for refill today: yes  Requested medication (s) are on the active medication list: yes  Last refill:  10/16/21,08/05/23  Future visit scheduled: yes  Notes to clinic:  Unable to refill per protocol, last refill by another provider. Historical medication     Requested Prescriptions  Pending Prescriptions Disp Refills   allopurinol  (ZYLOPRIM ) 300 MG tablet 90 tablet 1    Sig: Take 1 tablet (300 mg total) by mouth daily.     Endocrinology:  Gout Agents - allopurinol  Failed - 03/01/2024  3:07 PM      Failed - Uric Acid in normal range and within 360 days    Uric Acid  Date Value Ref Range Status  07/02/2019 8.2 (H) 3.0 - 7.2 mg/dL Final    Comment:               Therapeutic target for gout patients: <6.0         Passed - Cr in normal range and within 360 days    Creatinine  Date Value Ref Range Status  01/15/2014 1.36 (H) 0.60 - 1.30 mg/dL Final   Creatinine, Ser  Date Value Ref Range Status  08/05/2023 0.71 0.44 - 1.00 mg/dL Final         Passed - Valid encounter within last 12 months    Recent Outpatient Visits           7 months ago Chronic diastolic heart failure (HCC)   [facility] Melvin Pao, NP              Passed - CBC within normal limits and completed in the last 12 months    WBC  Date Value Ref Range Status  05/19/2023 8.8 4.0 - 10.5 K/uL Final   RBC  Date Value Ref Range Status  05/19/2023 4.28 3.87 - 5.11 MIL/uL Final   Hemoglobin  Date Value Ref Range Status  05/19/2023 13.7 12.0 - 15.0 g/dL Final  90/77/7976 86.3 11.1 - 15.9 g/dL Final   HCT  Date Value Ref Range Status  05/19/2023 41.4 36.0 - 46.0 % Final   Hematocrit  Date Value Ref Range Status  10/16/2021 42.6 34.0 - 46.6 % Final   MCHC  Date Value Ref Range Status  05/19/2023 33.1 30.0 - 36.0 g/dL Final   [facility]  Date Value Ref Range Status  05/19/2023 32.0 26.0 - 34.0 pg Final   MCV  Date Value Ref Range Status   05/19/2023 96.7 80.0 - 100.0 fL Final  10/16/2021 95 79 - 97 fL Final  01/15/2014 87 80 - 100 fL Final   No results found for: PLTCOUNTKUC, LABPLAT, POCPLA RDW  Date Value Ref Range Status  05/19/2023 13.9 11.5 - 15.5 % Final  10/16/2021 14.0 11.7 - 15.4 % Final  01/15/2014 16.0 (H) 11.5 - 14.5 % Final          potassium chloride  SA (KLOR-CON  M) 20 MEQ tablet      Sig: Take 3 tablets (60 mEq total) by mouth daily.     Endocrinology:  Minerals - Potassium Supplementation Failed - 03/01/2024  3:07 PM      Failed - K in normal range and within 360 days    Potassium  Date Value Ref Range Status  08/05/2023 3.4 (L) 3.5 - 5.1 mmol/L Final  01/15/2014 3.9 3.5 - 5.1 mmol/L Final         Passed - Cr in normal range and within 360 days  Creatinine  Date Value Ref Range Status  01/15/2014 1.36 (H) 0.60 - 1.30 mg/dL Final   Creatinine, Ser  Date Value Ref Range Status  08/05/2023 0.71 0.44 - 1.00 mg/dL Final         Passed - Valid encounter within last 12 months    Recent Outpatient Visits           7 months ago Chronic diastolic heart failure [facility])   [place] [facility] Melvin Pao, NP

## 2024-03-21 ENCOUNTER — Ambulatory Visit: Admitting: Nurse Practitioner
